# Patient Record
Sex: Female | Born: 1965
Health system: Southern US, Community
[De-identification: ages and names within clinical notes are randomized; demographics above are authoritative.]

## PROBLEM LIST (undated history)

## (undated) DIAGNOSIS — Z87442 Personal history of urinary calculi: Secondary | ICD-10-CM

## (undated) DIAGNOSIS — E785 Hyperlipidemia, unspecified: Secondary | ICD-10-CM

## (undated) DIAGNOSIS — R0602 Shortness of breath: Secondary | ICD-10-CM

## (undated) DIAGNOSIS — D649 Anemia, unspecified: Secondary | ICD-10-CM

## (undated) DIAGNOSIS — Z8719 Personal history of other diseases of the digestive system: Secondary | ICD-10-CM

## (undated) DIAGNOSIS — I447 Left bundle-branch block, unspecified: Secondary | ICD-10-CM

## (undated) DIAGNOSIS — E079 Disorder of thyroid, unspecified: Secondary | ICD-10-CM

## (undated) DIAGNOSIS — M1990 Unspecified osteoarthritis, unspecified site: Secondary | ICD-10-CM

## (undated) DIAGNOSIS — F419 Anxiety disorder, unspecified: Secondary | ICD-10-CM

## (undated) DIAGNOSIS — R7611 Nonspecific reaction to tuberculin skin test without active tuberculosis: Secondary | ICD-10-CM

## (undated) DIAGNOSIS — M545 Low back pain, unspecified: Secondary | ICD-10-CM

## (undated) DIAGNOSIS — E039 Hypothyroidism, unspecified: Secondary | ICD-10-CM

## (undated) DIAGNOSIS — G8929 Other chronic pain: Secondary | ICD-10-CM

## (undated) DIAGNOSIS — G43909 Migraine, unspecified, not intractable, without status migrainosus: Secondary | ICD-10-CM

## (undated) DIAGNOSIS — F329 Major depressive disorder, single episode, unspecified: Secondary | ICD-10-CM

## (undated) DIAGNOSIS — E119 Type 2 diabetes mellitus without complications: Secondary | ICD-10-CM

## (undated) DIAGNOSIS — F32A Depression, unspecified: Secondary | ICD-10-CM

## (undated) HISTORY — PX: TUBAL LIGATION: SHX77

## (undated) HISTORY — DX: Anxiety disorder, unspecified: F41.9

## (undated) HISTORY — DX: Hyperlipidemia, unspecified: E78.5

## (undated) HISTORY — DX: Disorder of thyroid, unspecified: E07.9

## (undated) HISTORY — PX: LAPAROSCOPY: SHX197

## (undated) HISTORY — PX: ENDOMETRIAL ABLATION: SHX621

---

## 1997-06-25 HISTORY — PX: DILATION AND CURETTAGE OF UTERUS: SHX78

## 1997-10-05 ENCOUNTER — Other Ambulatory Visit: Admission: RE | Admit: 1997-10-05 | Discharge: 1997-10-05 | Payer: Self-pay | Admitting: Obstetrics and Gynecology

## 1997-10-15 ENCOUNTER — Other Ambulatory Visit: Admission: RE | Admit: 1997-10-15 | Discharge: 1997-10-15 | Payer: Self-pay | Admitting: Obstetrics and Gynecology

## 1997-10-15 ENCOUNTER — Ambulatory Visit (HOSPITAL_COMMUNITY): Admission: AD | Admit: 1997-10-15 | Discharge: 1997-10-15 | Payer: Self-pay | Admitting: Obstetrics and Gynecology

## 1997-12-01 ENCOUNTER — Other Ambulatory Visit: Admission: RE | Admit: 1997-12-01 | Discharge: 1997-12-01 | Payer: Self-pay | Admitting: Obstetrics and Gynecology

## 1998-03-15 ENCOUNTER — Ambulatory Visit (HOSPITAL_COMMUNITY): Admission: RE | Admit: 1998-03-15 | Discharge: 1998-03-15 | Payer: Self-pay | Admitting: Obstetrics and Gynecology

## 1998-03-25 ENCOUNTER — Other Ambulatory Visit: Admission: RE | Admit: 1998-03-25 | Discharge: 1998-03-25 | Payer: Self-pay | Admitting: Obstetrics & Gynecology

## 1998-04-22 ENCOUNTER — Emergency Department (HOSPITAL_COMMUNITY): Admission: EM | Admit: 1998-04-22 | Discharge: 1998-04-22 | Payer: Self-pay

## 1998-09-22 ENCOUNTER — Inpatient Hospital Stay (HOSPITAL_COMMUNITY): Admission: AD | Admit: 1998-09-22 | Discharge: 1998-09-22 | Payer: Self-pay | Admitting: *Deleted

## 1998-10-30 ENCOUNTER — Inpatient Hospital Stay (HOSPITAL_COMMUNITY): Admission: AD | Admit: 1998-10-30 | Discharge: 1998-11-01 | Payer: Self-pay | Admitting: Obstetrics & Gynecology

## 1998-12-08 ENCOUNTER — Ambulatory Visit (HOSPITAL_COMMUNITY): Admission: RE | Admit: 1998-12-08 | Discharge: 1998-12-09 | Payer: Self-pay | Admitting: Obstetrics and Gynecology

## 1999-03-02 ENCOUNTER — Emergency Department (HOSPITAL_COMMUNITY): Admission: EM | Admit: 1999-03-02 | Discharge: 1999-03-02 | Payer: Self-pay | Admitting: Emergency Medicine

## 1999-07-20 ENCOUNTER — Emergency Department (HOSPITAL_COMMUNITY): Admission: EM | Admit: 1999-07-20 | Discharge: 1999-07-20 | Payer: Self-pay | Admitting: *Deleted

## 2000-03-30 ENCOUNTER — Emergency Department (HOSPITAL_COMMUNITY): Admission: EM | Admit: 2000-03-30 | Discharge: 2000-03-30 | Payer: Self-pay | Admitting: *Deleted

## 2000-10-08 ENCOUNTER — Encounter: Payer: Self-pay | Admitting: Obstetrics and Gynecology

## 2000-10-08 ENCOUNTER — Ambulatory Visit (HOSPITAL_COMMUNITY): Admission: RE | Admit: 2000-10-08 | Discharge: 2000-10-08 | Payer: Self-pay | Admitting: Obstetrics and Gynecology

## 2000-12-23 ENCOUNTER — Encounter: Payer: Self-pay | Admitting: Obstetrics and Gynecology

## 2000-12-23 ENCOUNTER — Ambulatory Visit (HOSPITAL_COMMUNITY): Admission: RE | Admit: 2000-12-23 | Discharge: 2000-12-23 | Payer: Self-pay | Admitting: Obstetrics and Gynecology

## 2001-01-31 ENCOUNTER — Ambulatory Visit (HOSPITAL_COMMUNITY): Admission: RE | Admit: 2001-01-31 | Discharge: 2001-01-31 | Payer: Self-pay | Admitting: Obstetrics and Gynecology

## 2001-01-31 ENCOUNTER — Encounter: Payer: Self-pay | Admitting: Obstetrics and Gynecology

## 2001-02-20 ENCOUNTER — Inpatient Hospital Stay (HOSPITAL_COMMUNITY): Admission: AD | Admit: 2001-02-20 | Discharge: 2001-02-20 | Payer: Self-pay | Admitting: Obstetrics and Gynecology

## 2001-02-23 ENCOUNTER — Inpatient Hospital Stay (HOSPITAL_COMMUNITY): Admission: AD | Admit: 2001-02-23 | Discharge: 2001-02-23 | Payer: Self-pay | Admitting: Obstetrics and Gynecology

## 2001-02-24 ENCOUNTER — Inpatient Hospital Stay (HOSPITAL_COMMUNITY): Admission: AD | Admit: 2001-02-24 | Discharge: 2001-02-27 | Payer: Self-pay | Admitting: Obstetrics and Gynecology

## 2001-04-07 ENCOUNTER — Ambulatory Visit (HOSPITAL_COMMUNITY): Admission: RE | Admit: 2001-04-07 | Discharge: 2001-04-07 | Payer: Self-pay | Admitting: Obstetrics and Gynecology

## 2002-03-06 ENCOUNTER — Emergency Department (HOSPITAL_COMMUNITY): Admission: EM | Admit: 2002-03-06 | Discharge: 2002-03-06 | Payer: Self-pay | Admitting: Emergency Medicine

## 2002-06-30 ENCOUNTER — Other Ambulatory Visit: Admission: RE | Admit: 2002-06-30 | Discharge: 2002-06-30 | Payer: Self-pay | Admitting: Obstetrics and Gynecology

## 2002-07-06 ENCOUNTER — Emergency Department (HOSPITAL_COMMUNITY): Admission: EM | Admit: 2002-07-06 | Discharge: 2002-07-06 | Payer: Self-pay | Admitting: Emergency Medicine

## 2003-06-27 ENCOUNTER — Emergency Department (HOSPITAL_COMMUNITY): Admission: EM | Admit: 2003-06-27 | Discharge: 2003-06-27 | Payer: Self-pay

## 2003-07-19 ENCOUNTER — Emergency Department (HOSPITAL_COMMUNITY): Admission: EM | Admit: 2003-07-19 | Discharge: 2003-07-19 | Payer: Self-pay | Admitting: Emergency Medicine

## 2003-09-22 ENCOUNTER — Emergency Department (HOSPITAL_COMMUNITY): Admission: EM | Admit: 2003-09-22 | Discharge: 2003-09-22 | Payer: Self-pay | Admitting: Emergency Medicine

## 2003-10-04 ENCOUNTER — Emergency Department (HOSPITAL_COMMUNITY): Admission: EM | Admit: 2003-10-04 | Discharge: 2003-10-04 | Payer: Self-pay | Admitting: Emergency Medicine

## 2004-04-27 ENCOUNTER — Emergency Department (HOSPITAL_COMMUNITY): Admission: EM | Admit: 2004-04-27 | Discharge: 2004-04-27 | Payer: Self-pay | Admitting: Emergency Medicine

## 2004-06-07 ENCOUNTER — Encounter: Admission: RE | Admit: 2004-06-07 | Discharge: 2004-06-07 | Payer: Self-pay | Admitting: Internal Medicine

## 2004-10-09 ENCOUNTER — Emergency Department (HOSPITAL_COMMUNITY): Admission: EM | Admit: 2004-10-09 | Discharge: 2004-10-09 | Payer: Self-pay | Admitting: Emergency Medicine

## 2004-10-17 ENCOUNTER — Emergency Department (HOSPITAL_COMMUNITY): Admission: EM | Admit: 2004-10-17 | Discharge: 2004-10-17 | Payer: Self-pay | Admitting: *Deleted

## 2004-10-17 ENCOUNTER — Other Ambulatory Visit: Admission: RE | Admit: 2004-10-17 | Discharge: 2004-10-17 | Payer: Self-pay | Admitting: Obstetrics and Gynecology

## 2004-10-26 ENCOUNTER — Ambulatory Visit (HOSPITAL_COMMUNITY): Admission: RE | Admit: 2004-10-26 | Discharge: 2004-10-26 | Payer: Self-pay | Admitting: Obstetrics and Gynecology

## 2004-11-22 ENCOUNTER — Ambulatory Visit (HOSPITAL_COMMUNITY): Admission: RE | Admit: 2004-11-22 | Discharge: 2004-11-22 | Payer: Self-pay | Admitting: Obstetrics and Gynecology

## 2004-11-22 ENCOUNTER — Encounter (INDEPENDENT_AMBULATORY_CARE_PROVIDER_SITE_OTHER): Payer: Self-pay | Admitting: Specialist

## 2005-05-30 ENCOUNTER — Emergency Department (HOSPITAL_COMMUNITY): Admission: EM | Admit: 2005-05-30 | Discharge: 2005-05-31 | Payer: Self-pay | Admitting: Emergency Medicine

## 2005-06-06 ENCOUNTER — Emergency Department (HOSPITAL_COMMUNITY): Admission: EM | Admit: 2005-06-06 | Discharge: 2005-06-06 | Payer: Self-pay | Admitting: Emergency Medicine

## 2005-08-20 ENCOUNTER — Emergency Department (HOSPITAL_COMMUNITY): Admission: EM | Admit: 2005-08-20 | Discharge: 2005-08-20 | Payer: Self-pay | Admitting: Emergency Medicine

## 2006-05-02 ENCOUNTER — Emergency Department (HOSPITAL_COMMUNITY): Admission: EM | Admit: 2006-05-02 | Discharge: 2006-05-03 | Payer: Self-pay | Admitting: Emergency Medicine

## 2006-06-24 ENCOUNTER — Emergency Department (HOSPITAL_COMMUNITY): Admission: EM | Admit: 2006-06-24 | Discharge: 2006-06-24 | Payer: Self-pay | Admitting: Emergency Medicine

## 2006-06-25 DIAGNOSIS — R7611 Nonspecific reaction to tuberculin skin test without active tuberculosis: Secondary | ICD-10-CM

## 2006-06-25 HISTORY — DX: Nonspecific reaction to tuberculin skin test without active tuberculosis: R76.11

## 2006-07-24 ENCOUNTER — Emergency Department (HOSPITAL_COMMUNITY): Admission: EM | Admit: 2006-07-24 | Discharge: 2006-07-24 | Payer: Self-pay | Admitting: Emergency Medicine

## 2006-09-12 ENCOUNTER — Emergency Department (HOSPITAL_COMMUNITY): Admission: EM | Admit: 2006-09-12 | Discharge: 2006-09-13 | Payer: Self-pay | Admitting: Emergency Medicine

## 2006-12-30 ENCOUNTER — Emergency Department (HOSPITAL_COMMUNITY): Admission: EM | Admit: 2006-12-30 | Discharge: 2006-12-30 | Payer: Self-pay | Admitting: Emergency Medicine

## 2007-04-05 ENCOUNTER — Emergency Department (HOSPITAL_COMMUNITY): Admission: EM | Admit: 2007-04-05 | Discharge: 2007-04-05 | Payer: Self-pay | Admitting: Emergency Medicine

## 2007-05-07 ENCOUNTER — Emergency Department (HOSPITAL_COMMUNITY): Admission: EM | Admit: 2007-05-07 | Discharge: 2007-05-07 | Payer: Self-pay | Admitting: Emergency Medicine

## 2007-12-01 ENCOUNTER — Emergency Department (HOSPITAL_COMMUNITY): Admission: EM | Admit: 2007-12-01 | Discharge: 2007-12-01 | Payer: Self-pay | Admitting: Family Medicine

## 2007-12-14 ENCOUNTER — Emergency Department (HOSPITAL_COMMUNITY): Admission: EM | Admit: 2007-12-14 | Discharge: 2007-12-14 | Payer: Self-pay | Admitting: Emergency Medicine

## 2008-03-03 ENCOUNTER — Emergency Department (HOSPITAL_COMMUNITY): Admission: EM | Admit: 2008-03-03 | Discharge: 2008-03-03 | Payer: Self-pay | Admitting: Emergency Medicine

## 2008-07-14 ENCOUNTER — Ambulatory Visit (HOSPITAL_COMMUNITY): Admission: RE | Admit: 2008-07-14 | Discharge: 2008-07-14 | Payer: Self-pay | Admitting: Internal Medicine

## 2008-07-16 ENCOUNTER — Encounter: Admission: RE | Admit: 2008-07-16 | Discharge: 2008-07-16 | Payer: Self-pay | Admitting: Internal Medicine

## 2008-07-27 ENCOUNTER — Encounter (INDEPENDENT_AMBULATORY_CARE_PROVIDER_SITE_OTHER): Payer: Self-pay | Admitting: Interventional Radiology

## 2008-07-27 ENCOUNTER — Encounter: Admission: RE | Admit: 2008-07-27 | Discharge: 2008-07-27 | Payer: Self-pay | Admitting: Internal Medicine

## 2008-07-27 ENCOUNTER — Other Ambulatory Visit: Admission: RE | Admit: 2008-07-27 | Discharge: 2008-07-27 | Payer: Self-pay | Admitting: Interventional Radiology

## 2009-04-14 ENCOUNTER — Emergency Department (HOSPITAL_COMMUNITY): Admission: EM | Admit: 2009-04-14 | Discharge: 2009-04-14 | Payer: Self-pay | Admitting: Emergency Medicine

## 2009-08-02 ENCOUNTER — Emergency Department (HOSPITAL_COMMUNITY): Admission: EM | Admit: 2009-08-02 | Discharge: 2009-08-02 | Payer: Self-pay | Admitting: Emergency Medicine

## 2009-09-18 ENCOUNTER — Emergency Department (HOSPITAL_COMMUNITY): Admission: EM | Admit: 2009-09-18 | Discharge: 2009-09-18 | Payer: Self-pay | Admitting: Emergency Medicine

## 2010-07-16 ENCOUNTER — Encounter: Payer: Self-pay | Admitting: Internal Medicine

## 2010-11-10 NOTE — Op Note (Signed)
NAMECYNDA, Felicia Solis                 ACCOUNT NO.:  [id number]   MEDICAL RECORD NO.:  [id number]          PATIENT TYPE:  AMB   LOCATION:  SDC                           FACILITY:  [facility]   PHYSICIAN:  Naima A. Dillard, M.D. DATE OF BIRTH:  [birth year]   DATE OF PROCEDURE:  11/22/2004  DATE OF DISCHARGE:                                 OPERATIVE REPORT   PREOPERATIVE DIAGNOSES:  1.  Dysmenorrhea.  2.  Pelvic pain.  3.  Menorrhagia.   POSTOPERATIVE DIAGNOSIS:  1.  Dysmenorrhea.  2.  Pelvic pain.  3.  Menorrhagia.   PROCEDURE:  D&C hysteroscopy, NovaSure ablation.   SURGEON:  Naima A. Dillard, M.D.   ASSISTANT:  None.   ANESTHESIA:  General laryngeal mask airway.   SPECIMENS:  Endometrial curettings.   ESTIMATED BLOOD LOSS:  Minimal.   COMPLICATIONS:  None.   DESCRIPTION OF PROCEDURE:  Patient is taken to the operating room where she  was given general anesthesia, placed in the dorsal lithotomy position and  prepped and draped in the normal sterile fashion.  Bladder was drained of  about 20 mL of urine.  Bivalve speculum was placed into the vagina.  The  anterior lip of the cervix was grasped with single-tooth tenaculum.  The  uterus did sound to 10 cm.  The cervical length was noted to be 4 cm which  gave a cavity length of 6 cm.  The cervix was further dilated with Shawnie Pons  dilators and the hysteroscope was placed into the uterine cavity, both ostia  were seen, no polyps or submucosal fibroids were noted, just abundant  endometrium.  The instrument was then removed.  The sharp curettage was  placed into the uterine cavity and moderate amount of endometrial curettings  were obtained.  All instruments were then removed from the uterus.  The  NovaSure was then placed and seated.  The cavity was noted to be 3.8, power  was 125 and ablation was done for one minute.  After the  safety test was passed, all instruments were removed from the vagina and the  tenaculum was removed from the  cervix with hemostasis noted.  Sponge, lap  and needle counts correct x2.  Patient went to the recovery room in stable  condition.       NAD/MEDQ  D:  11/22/2004  T:  11/22/2004  Job:  [id number]

## 2010-11-10 NOTE — Op Note (Signed)
[facility]  Patient:    Felicia Solis, Felicia Solis                          MRN: [id number] Proc. Date: 10/08/00 Attending:  Erie Noe P. Pennie Rushing, M.D.                           Operative Report  PREOPERATIVE DIAGNOSES:       1. Maternal age of 41.                               2. Pregnancy at 18 weeks.  POSTOPERATIVE DIAGNOSES:      1. Maternal age of 17.                               2. Pregnancy at 18 weeks.  OPERATION:                    Genetic amniocentesis.  SURGEON:                      Vanessa P. Haygood, M.D.  ASSISTANT:  ANESTHESIA:                   Local anesthesia.  ESTIMATED BLOOD LOSS:         Less than 10 cc.  COMPLICATIONS:                None.  FINDINGS:                     The pregnancy contained an anterior placenta and                               adequate fluid.  DESCRIPTION OF PROCEDURE:     The patient was taken to the ultrasound suite and after undergoing a detailed ultrasound, a pocket of fluid was noted which was free of the insertion site and free of fetal parts. There was also no cord noted on ultrasound.  Under ultrasound guidance and a single pass of a 20-gauge spinal needle, the amniotic fluid pocket was accessed.  This was performed after cleansing and local anesthetic infiltration of that area. There were 5 cc of clear fluid drawn into the first syringe and then 10 cc into the second syringe. The amniocentesis site was documented on ultrasound and post amniocentesis heart rate was 150 beats per minute.  The needle was removed and the bandage placed over the amnio site.  The patient tolerated the procedure well and the amniotic fluid was sent to [facility] for genetic analysis. DD:  10/08/00 TD:  10/09/00 Job: [id number]

## 2010-11-10 NOTE — H&P (Signed)
Felicia Solis, Felicia Solis                 ACCOUNT NO.:  [id number]   MEDICAL RECORD NO.:  [id number]          PATIENT TYPE:  AMB   LOCATION:  SDC                           FACILITY:  [facility]   PHYSICIAN:  Naima A. Dillard, M.D. DATE OF BIRTH:  [birth year]   DATE OF ADMISSION:  DATE OF DISCHARGE:                                HISTORY & PHYSICAL   CHIEF COMPLAINT:  Dysmenorrhea, pelvic pain, and menorrhagia.   HISTORY OF PRESENT ILLNESS:  The patient is a 45 year old African-American  female, gravida 5, para 3, who presented with complaint of heavy menses, and  painful menses for over 6 months.  The patient says her menses are every  month lasting for 8 days.  She changes her pad about every 1 to 2 hours.  The patient denied having any shortness of breath or chest pain.  She does  have a history of fibroids seen on ultrasound.  She is not on contraception.  Denies having any bleeding disorder.  She does have dysmenorrhea with  menses.  She denies trying any new medications.  On her ultrasound which was  done on Oct 26, 2004, both ovaries were normal.  The uterus was 10 x 6 x 7 cm  and there were two fibroids seen, the largest being 1.3 cm.  Endometrial  stripe was 1.4 cm.  The patient had endometrial biopsy done Oct 26, 2004,  which was significant for benign endocervical polyp and benign proliferative  endometrium.   PAST MEDICAL HISTORY:  Significant for asthma.   PAST SURGICAL HISTORY:  Significant for bilateral tubal ligation and  laparoscopy x2 for pelvic pain.   PAST OB HISTORY:  Significant for vaginal delivery x3 without any  complications.  She had two spontaneous abortions in the second trimester  which required D&C.   SOCIAL HISTORY:  Negative for alcohol, drug, and tobacco use or illicit drug  use.   MEDICATIONS:  Albuterol p.r.n.   FAMILY HISTORY:  Significant for a mother with hypertension and a sister  with diabetes.   ALLERGIES:  No known drug allergies.   REVIEW OF  SYSTEMS:  CARDIOVASCULAR:  Unremarkable.  RESPIRATORY:  Significant for asthma.  ENDOCRINE:  Unremarkable.  PSYCHIATRIC:  MUSCULOSKELETAL:  Unremarkable.  GENITOURINARY:  As above.   PHYSICAL EXAMINATION:  VITAL SIGNS:  The patient's blood pressure is 102/60,  weight 243 pounds.  HEENT:  Pupils are equal, hearing is normal, throat is clear.  Thyroid is  not enlarged.  HEART:  Regular rate and rhythm.  LUNGS:  Clear to auscultation bilaterally.  BREASTS:  No masses, discharge, skin changes, or nipple retraction  bilaterally.  BACK:  No CVA tenderness bilaterally.  ABDOMEN:  Nontender without any masses or organomegaly.  EXTREMITIES:  No cyanosis, clubbing, or edema.  NEUROLOGY:  Within normal limits.  PELVIC:  Full vaginal examination is within normal limits.  Cervix is  nontender without lesions.  Uterine size is difficult to tell secondary to  body habitus.  Adnexa had no masses and nontender.  RECTOVAGINAL:  Negative.   The patient's UA was negative.  Wet prep  was negative.   ASSESSMENT:  Pelvic pain and menorrhagia, uterine fibroids with benign  endometrial biopsy.   All treatments were reviewed for menorrhagia, pelvic pain, and dysmenorrhea.  The patient desires to go with hysteroscopy, D&C, and NovaSure ablation.  The risks are, but not limited to, bleeding, infection, damage to internal  organs such as bowel, bladder, and major blood vessels.  This was explained  to the patient in detail.      NAD/MEDQ  D:  11/21/2004  T:  11/21/2004  Job:  [id number]
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
## 2010-11-10 NOTE — H&P (Signed)
[facility]  Patient:    Felicia Solis, Felicia Solis Visit Number: [id number] MRN: [id number]          Service Type: Attending:  Maris Berger. Pennie Rushing, M.D. Dictated by:   Henreitta Leber, P.A. Adm. Date:  04/07/01                           History and Physical  DATE OF BIRTH:                [birth year]  HISTORY OF PRESENT ILLNESS:   Mrs. Delisa is a 45 year old, married African-American female, gravida 5, para 3-0-2-3 presenting for sterilization through laparoscopic tubal cautery.  PAST OBSTETRIC HISTORY:       Gravida 5, para 3-0-2-3.  GYNECOLOGICAL HISTORY:        Menarche 45 years of age. Her periods are regular. However, she has a remote history of oligomenorrhea, Chlamydia, and gonorrhea. She currently is not using any method of contraception. Has no history of having abnormal Pap smear. Her last normal Pap smear was February 2002.  PAST MEDICAL HISTORY:         Positive for migraines, hiatal hernia, and anemia.  PAST SURGICAL HISTORY:        The patient is status post D&E secondary to SAB, laparoscopy in the 1970s. She denies any history of blood transfusions or problems with anesthesia.  SOCIAL HISTORY/HABITS:        The patient is married. She works as a Lawyer. The patient does not use tobacco or alcohol.  FAMILY HISTORY:               Positive for hypertension, diabetes mellitus, cardiovascular disease, migraines, breast cancer (maternal aunt), and cervical cancer (maternal grandmother).  REVIEW OF SYSTEMS:            Negative, except as mentioned in previous history.  PHYSICAL EXAMINATION:  VITAL SIGNS:                  Blood pressure 110/80, weight is 217 pounds, height is 5 feet 7 1/2 inches tall.  HEENT:                        Pupils are equal. Hearing is normal. Throat is clear. The patient is hirsute.  NECK:                         Thyroid is not enlarged.  HEART:                        Regular rate and rhythm. There is no  murmur.  LUNGS:                        Clear without wheezes, rales, or rhonchi.  BACK:                         Without CVA tenderness.  ABDOMEN:                      Bowel sounds are present. It is soft, nontender. There is no organomegaly.  EXTREMITIES:                  Without clubbing, cyanosis, or edema.  PELVIC:  EGBUS is within normal limits. Vagina is rugous. Do note that patient has menstrual blood in the vaginal vault. Cervix is nontender without lesions. Uterus is normal size, shape, and consistency without tenderness. Adnexa without tenderness or masses. Rectovaginal without tenderness or masses.  IMPRESSION:                   Desire for permanent sterilization.  DISPOSITION:                  A discussion was held with the patient regarding methods of contraception, effectiveness, mechanism of action, risks and benefits. The patient has consented to undergo laparoscopic tubal cautery. She understands the risks involved to include, but are not limited to, reaction to anesthesia, infection, bleeding, and damage to adjacent organs. The patients surgery is scheduled for April 07, 2001, at [facility], 1:45 p.m. Dictated by:   Henreitta Leber, P.A. Attending:  Maris Berger. Pennie Rushing, M.D. DD:  04/03/01 TD:  04/03/01 Job: [id number]

## 2010-11-10 NOTE — Op Note (Signed)
[facility]  Patient:    Felicia Solis, Felicia Solis Visit Number: [id number] MRN: [id number]          Service Type: DSU Location: [facility] Attending Physician:  Shaune Spittle Proc. Date: 04/07/01 Admit Date:  04/07/2001                             Operative Report  PREOPERATIVE DIAGNOSIS:       Desire for surgical sterilization.  POSTOPERATIVE DIAGNOSES:      1. Desire for surgical sterilization.                               2. Morphologic appearance of ovaries consistent                                  with polycystic ovarian syndrome.  OPERATION:                    Laparoscopic tubal cautery.  SURGEON:                      Vanessa P. Pennie Rushing, M.D.  ANESTHESIA:                   General orotracheal.  ESTIMATED BLOOD LOSS:         Less than 10 cc.  COMPLICATIONS:                None.  FINDINGS:                     The uterus and tubes were within normal limits. The ovaries bilaterally were upper limits of normal size with a glistening capsule covering several small cystic areas, all consistent with polycystic ovarian syndrome.  DESCRIPTION OF PROCEDURE:     The patient was brought to the operating room after appropriate identification and placed on the operating table.  After the attainment of adequate general anesthesia she was placed in the modified lithotomy position.  The abdomen, perineum and vagina were prepped with multiple layers of Betadine.  A red Robinson catheter was used to empty the bladder.  A Hulka tenaculum was placed on the anterior cervix.  The abdomen was draped as a sterile field.  Subumbilical injection of a total of 9 cc of quarter percent Marcaine was undertaken.  A subumbilical incision was made.  A Veress cannula was placed through that incision into the peritoneal cavity.  A pneumoperitoneum was created with 3.5 liters of CO2.  The Veress cannula was removed and the laparoscopic trocar placed through that incision  into the peritoneal cavity.  The laparoscope was placed through the trocar sleeve.  The cautery mechanism was placed through the operating channel of the laparoscope and the right fallopian tube identified, followed to its fimbriated end, then grasped at the isthmic portion and cauterized in two adjacent areas.  A similar procedure was carried out on the opposite side.  Hemostasis was noted to be adequate.  All instruments were then removed from the peritoneal cavity under direct visualization as the CO2 was allowed to escape.  The subumbilical incision was closed with a subcuticular suture of 3-0 Vicryl.  The Hulka tenaculum was removed.  The patient was awakened from general anesthesia and taken to the recovery room in satisfactory condition  having tolerated the procedure well with sponge and instrument counts correct. Attending Physician:  Shaune Spittle DD:  04/07/01 TD:  04/07/01 Job: [id number]

## 2011-04-05 LAB — POCT CARDIAC MARKERS
CKMB, poc: <1 — ABNORMAL LOW
Myoglobin, poc: 53
Operator id: 4074

## 2011-04-05 LAB — BASIC METABOLIC PANEL
Chloride: 106
GFR calc Af Amer: >60
Glucose, Bld: 99
Potassium: 3.2 — ABNORMAL LOW
Sodium: 140

## 2011-08-09 ENCOUNTER — Other Ambulatory Visit: Payer: Self-pay | Admitting: Family Medicine

## 2011-08-09 DIAGNOSIS — Z1231 Encounter for screening mammogram for malignant neoplasm of breast: Secondary | ICD-10-CM

## 2011-08-24 ENCOUNTER — Ambulatory Visit
Admission: RE | Admit: 2011-08-24 | Discharge: 2011-08-24 | Disposition: A | Discharge status: Home / Self Care | Payer: Self-pay | Source: Ambulatory Visit | Attending: Family Medicine | Admitting: Family Medicine

## 2011-08-24 DIAGNOSIS — Z1231 Encounter for screening mammogram for malignant neoplasm of breast: Secondary | ICD-10-CM

## 2011-08-27 ENCOUNTER — Encounter: Payer: Self-pay | Admitting: Family Medicine

## 2011-08-27 ENCOUNTER — Ambulatory Visit (INDEPENDENT_AMBULATORY_CARE_PROVIDER_SITE_OTHER): Payer: Self-pay | Admitting: Family Medicine

## 2011-08-27 ENCOUNTER — Other Ambulatory Visit (HOSPITAL_COMMUNITY)
Admission: RE | Admit: 2011-08-27 | Discharge: 2011-08-27 | Disposition: A | Discharge status: Home / Self Care | Payer: Self-pay | Source: Ambulatory Visit | Attending: Family Medicine | Admitting: Family Medicine

## 2011-08-27 VITALS — BP 122/82 | HR 78 | Temp 98.4°F | Ht 66.5 in | Wt 241.2 lb

## 2011-08-27 DIAGNOSIS — F418 Other specified anxiety disorders: Secondary | ICD-10-CM | POA: Insufficient documentation

## 2011-08-27 DIAGNOSIS — Z124 Encounter for screening for malignant neoplasm of cervix: Secondary | ICD-10-CM

## 2011-08-27 DIAGNOSIS — F32A Depression, unspecified: Secondary | ICD-10-CM

## 2011-08-27 DIAGNOSIS — Z0000 Encounter for general adult medical examination without abnormal findings: Secondary | ICD-10-CM

## 2011-08-27 DIAGNOSIS — F329 Major depressive disorder, single episode, unspecified: Secondary | ICD-10-CM

## 2011-08-27 DIAGNOSIS — N920 Excessive and frequent menstruation with regular cycle: Secondary | ICD-10-CM

## 2011-08-27 DIAGNOSIS — Z01419 Encounter for gynecological examination (general) (routine) without abnormal findings: Secondary | ICD-10-CM | POA: Insufficient documentation

## 2011-08-27 DIAGNOSIS — Z1159 Encounter for screening for other viral diseases: Secondary | ICD-10-CM | POA: Insufficient documentation

## 2011-08-27 DIAGNOSIS — R319 Hematuria, unspecified: Secondary | ICD-10-CM

## 2011-08-27 DIAGNOSIS — Z862 Personal history of diseases of the blood and blood-forming organs and certain disorders involving the immune mechanism: Secondary | ICD-10-CM

## 2011-08-27 DIAGNOSIS — Z8639 Personal history of other endocrine, nutritional and metabolic disease: Secondary | ICD-10-CM

## 2011-08-27 LAB — POCT URINALYSIS DIPSTICK
Glucose, UA: NEGATIVE
Ketones, UA: NEGATIVE

## 2011-08-27 LAB — LIPID PANEL
LDL Cholesterol: 119 mg/dL — ABNORMAL HIGH (ref 0–99)
Triglycerides: 81 mg/dL (ref 0.0–149.0)
VLDL: 16.2 mg/dL (ref 0.0–40.0)

## 2011-08-27 LAB — HEPATIC FUNCTION PANEL
ALT: 23 U/L (ref 0–35)
AST: 18 U/L (ref 0–37)
Alkaline Phosphatase: 55 U/L (ref 39–117)

## 2011-08-27 LAB — BASIC METABOLIC PANEL
BUN: 10 mg/dL (ref 6–23)
Chloride: 108 mEq/L (ref 96–112)
Potassium: 3.7 mEq/L (ref 3.5–5.1)
Sodium: 139 mEq/L (ref 135–145)

## 2011-08-27 LAB — CBC WITH DIFFERENTIAL/PLATELET
Basophils Absolute: 0 10*3/uL (ref 0.0–0.1)
Basophils Relative: 0.7 % (ref 0.0–3.0)
HCT: 39.2 % (ref 36.0–46.0)
Hemoglobin: 12.9 g/dL (ref 12.0–15.0)
Lymphs Abs: 3 10*3/uL (ref 0.7–4.0)
Monocytes Absolute: 0.5 10*3/uL (ref 0.1–1.0)
Neutrophils Relative %: 47.5 % (ref 43.0–77.0)
Platelets: 289 10*3/uL (ref 150.0–400.0)

## 2011-08-27 MED ORDER — CITALOPRAM HYDROBROMIDE 10 MG PO TABS: 10.0000 mg | ORAL_TABLET | Freq: Every day | ORAL | Status: DC

## 2011-08-27 NOTE — Progress Notes (Signed)
Subjective:     Felicia Solis is a 46 y.o. female and is here for a comprehensive physical exam. The patient reports problem with very heavy period last week for one day with cramping.  She has had only spotting since ablation.  She also c/o depression/ anxiety for the last several months.   She cries for no reason --she is not suicidal.   Pt is also c/o bad breath that seems to never get better --no matter what she does.   History   Social History  . Marital Status: Married    Spouse Name: N/A    Number of Children: N/A  . Years of Education: N/A   Occupational History  . [employer]    Social History Main Topics  . Smoking status: Never Smoker   . Smokeless tobacco: Never Used  . Alcohol Use: No  . Drug Use: No  . Sexually Active: Yes -- Female partner(s)   Other Topics Concern  . Not on file   Social History Narrative  . No narrative on file   Health Maintenance  Topic Date Due  . Mammogram  07/16/2009  . Influenza Vaccine  03/28/2012  . Pap Smear  08/27/2014  . Tetanus/tdap  08/27/2015    The following portions of the patient's history were reviewed and updated as appropriate: allergies, current medications, past family history, past medical history, past social history, past surgical history and problem list.  Review of Systems Review of Systems  Constitutional: Negative for activity change, appetite change and fatigue.  HENT: Negative for hearing loss, congestion, tinnitus and ear discharge.  dentist- due Eyes: Negative for visual disturbance (see optho appointment tomorrow) Respiratory: Negative for cough, chest tightness and shortness of breath.   Cardiovascular: Negative for chest pain, palpitations and leg swelling.  Gastrointestinal: Negative for abdominal pain, diarrhea, constipation and abdominal distention.  Genitourinary: Negative for urgency, frequency, decreased urine volume and difficulty urinating.  Musculoskeletal: Negative for back pain, arthralgias  and gait problem.  Skin: Negative for color change, pallor and rash.  Neurological: Negative for dizziness, light-headedness, numbness and headaches.  Hematological: Negative for adenopathy. Does not bruise/bleed easily.  Psychiatric/Behavioral: Negative for suicidal ideas, confusion, sleep disturbance, self-injury, dysphoric mood, decreased concentration and agitation.       Objective:    BP 122/82  Pulse 78  Temp(Src) 98.4 F (36.9 C) (Oral)  Ht 5' 6.5" (1.689 m)  Wt 241 lb 3.2 oz (109.408 kg)  BMI 38.35 kg/m2  SpO2 99% General appearance: alert, cooperative, appears stated age and no distress Head: Normocephalic, without obvious abnormality, atraumatic Eyes: conjunctivae/corneas clear. PERRL, EOM's intact. Fundi benign. Ears: normal TM's and external ear canals both ears Nose: Nares normal. Septum midline. Mucosa normal. No drainage or sinus tenderness. Throat: lips, mucosa, and tongue normal; teeth and gums normal Neck: + goiter Back: symmetric, no curvature. ROM normal. No CVA tenderness. Lungs: clear to auscultation bilaterally Breasts: normal appearance, no masses or tenderness, No nipple retraction or dimpling, No nipple discharge or bleeding, No axillary or supraclavicular adenopathy Heart: regular rate and rhythm, S1, S2 normal, no murmur, click, rub or gallop Abdomen: soft, non-tender; bowel sounds normal; no masses,  no organomegaly Pelvic: cervix normal in appearance, external genitalia normal, no adnexal masses or tenderness, no cervical motion tenderness, rectovaginal septum normal, uterus normal size, shape, and consistency and vagina normal without discharge Extremities: extremities normal, atraumatic, no cyanosis or edema Pulses: 2+ and symmetric Skin: Skin color, texture, turgor normal. No rashes or lesions---+  lg skin tag L side at waist line Lymph nodes: Cervical, supraclavicular, and axillary nodes normal. Neurologic: Alert and oriented X 3, normal strength  and tone. Normal symmetric reflexes. Normal coordination and gait psych-- pt crying and c/o depression and anger problems,  no suicidal ideation    Assessment:    Healthy female exam.      Plan:    ghm utd Check fasting labs See After Visit Summary for Counseling Recommendations

## 2011-08-27 NOTE — Assessment & Plan Note (Signed)
Pt has appointment with gyn Check labs

## 2011-08-27 NOTE — Assessment & Plan Note (Signed)
celexa 10 mg qd  

## 2011-08-27 NOTE — Patient Instructions (Signed)
Preventive Care for Adults, Female A healthy lifestyle and preventive care can promote health and wellness. Preventive health guidelines for women include the following key practices.  A routine yearly physical is a good way to check with your caregiver about your health and preventive screening. It is a chance to share any concerns and updates on your health, and to receive a thorough exam.   Visit your dentist for a routine exam and preventive care every 6 months. Brush your teeth twice a day and floss once a day. Good oral hygiene prevents tooth decay and gum disease.   The frequency of eye exams is based on your age, health, family medical history, use of contact lenses, and other factors. Follow your caregiver's recommendations for frequency of eye exams.   Eat a healthy diet. Foods like vegetables, fruits, whole grains, low-fat dairy products, and lean protein foods contain the nutrients you need without too many calories. Decrease your intake of foods high in solid fats, added sugars, and salt. Eat the right amount of calories for you.Get information about a proper diet from your caregiver, if necessary.   Regular physical exercise is one of the most important things you can do for your health. Most adults should get at least 150 minutes of moderate-intensity exercise (any activity that increases your heart rate and causes you to sweat) each week. In addition, most adults need muscle-strengthening exercises on 2 or more days a week.   Maintain a healthy weight. The body mass index (BMI) is a screening tool to identify possible weight problems. It provides an estimate of body fat based on height and weight. Your caregiver can help determine your BMI, and can help you achieve or maintain a healthy weight.For adults 20 years and older:   A BMI below 18.5 is considered underweight.   A BMI of 18.5 to 24.9 is normal.   A BMI of 25 to 29.9 is considered overweight.   A BMI of 30 and above is  considered obese.   Maintain normal blood lipids and cholesterol levels by exercising and minimizing your intake of saturated fat. Eat a balanced diet with plenty of fruit and vegetables. Blood tests for lipids and cholesterol should begin at age 20 and be repeated every 5 years. If your lipid or cholesterol levels are high, you are over 50, or you are at high risk for heart disease, you may need your cholesterol levels checked more frequently.Ongoing high lipid and cholesterol levels should be treated with medicines if diet and exercise are not effective.   If you smoke, find out from your caregiver how to quit. If you do not use tobacco, do not start.   If you are pregnant, do not drink alcohol. If you are breastfeeding, be very cautious about drinking alcohol. If you are not pregnant and choose to drink alcohol, do not exceed 1 drink per day. One drink is considered to be 12 ounces (355 mL) of beer, 5 ounces (148 mL) of wine, or 1.5 ounces (44 mL) of liquor.   Avoid use of street drugs. Do not share needles with anyone. Ask for help if you need support or instructions about stopping the use of drugs.   High blood pressure causes heart disease and increases the risk of stroke. Your blood pressure should be checked at least every 1 to 2 years. Ongoing high blood pressure should be treated with medicines if weight loss and exercise are not effective.   If you are 55 to 47   years old, ask your caregiver if you should take aspirin to prevent strokes.   Diabetes screening involves taking a blood sample to check your fasting blood sugar level. This should be done once every 3 years, after age 45, if you are within normal weight and without risk factors for diabetes. Testing should be considered at a younger age or be carried out more frequently if you are overweight and have at least 1 risk factor for diabetes.   Breast cancer screening is essential preventive care for women. You should practice "breast  self-awareness." This means understanding the normal appearance and feel of your breasts and may include breast self-examination. Any changes detected, no matter how small, should be reported to a caregiver. Women in their 20s and 30s should have a clinical breast exam (CBE) by a caregiver as part of a regular health exam every 1 to 3 years. After age 40, women should have a CBE every year. Starting at age 40, women should consider having a mammography (breast X-ray test) every year. Women who have a family history of breast cancer should talk to their caregiver about genetic screening. Women at a high risk of breast cancer should talk to their caregivers about having magnetic resonance imaging (MRI) and a mammography every year.   The Pap test is a screening test for cervical cancer. A Pap test can show cell changes on the cervix that might become cervical cancer if left untreated. A Pap test is a procedure in which cells are obtained and examined from the lower end of the uterus (cervix).   Women should have a Pap test starting at age 21.   Between ages 21 and 29, Pap tests should be repeated every 2 years.   Beginning at age 30, you should have a Pap test every 3 years as long as the past 3 Pap tests have been normal.   Some women have medical problems that increase the chance of getting cervical cancer. Talk to your caregiver about these problems. It is especially important to talk to your caregiver if a new problem develops soon after your last Pap test. In these cases, your caregiver may recommend more frequent screening and Pap tests.   The above recommendations are the same for women who have or have not gotten the vaccine for human papillomavirus (HPV).   If you had a hysterectomy for a problem that was not cancer or a condition that could lead to cancer, then you no longer need Pap tests. Even if you no longer need a Pap test, a regular exam is a good idea to make sure no other problems are  starting.   If you are between ages 65 and 70, and you have had normal Pap tests going back 10 years, you no longer need Pap tests. Even if you no longer need a Pap test, a regular exam is a good idea to make sure no other problems are starting.   If you have had past treatment for cervical cancer or a condition that could lead to cancer, you need Pap tests and screening for cancer for at least 20 years after your treatment.   If Pap tests have been discontinued, risk factors (such as a new sexual partner) need to be reassessed to determine if screening should be resumed.   The HPV test is an additional test that may be used for cervical cancer screening. The HPV test looks for the virus that can cause the cell changes on the cervix.   The cells collected during the Pap test can be tested for HPV. The HPV test could be used to screen women aged 30 years and older, and should be used in women of any age who have unclear Pap test results. After the age of 30, women should have HPV testing at the same frequency as a Pap test.   Colorectal cancer can be detected and often prevented. Most routine colorectal cancer screening begins at the age of 50 and continues through age 75. However, your caregiver may recommend screening at an earlier age if you have risk factors for colon cancer. On a yearly basis, your caregiver may provide home test kits to check for hidden blood in the stool. Use of a small camera at the end of a tube, to directly examine the colon (sigmoidoscopy or colonoscopy), can detect the earliest forms of colorectal cancer. Talk to your caregiver about this at age 50, when routine screening begins. Direct examination of the colon should be repeated every 5 to 10 years through age 75, unless early forms of pre-cancerous polyps or small growths are found.   Hepatitis C blood testing is recommended for all people born from 1945 through 1965 and any individual with known risks for hepatitis C.    Practice safe sex. Use condoms and avoid high-risk sexual practices to reduce the spread of sexually transmitted infections (STIs). STIs include gonorrhea, chlamydia, syphilis, trichomonas, herpes, HPV, and human immunodeficiency virus (HIV). Herpes, HIV, and HPV are viral illnesses that have no cure. They can result in disability, cancer, and death. Sexually active women aged 25 and younger should be checked for chlamydia. Older women with new or multiple partners should also be tested for chlamydia. Testing for other STIs is recommended if you are sexually active and at increased risk.   Osteoporosis is a disease in which the bones lose minerals and strength with aging. This can result in serious bone fractures. The risk of osteoporosis can be identified using a bone density scan. Women ages 65 and over and women at risk for fractures or osteoporosis should discuss screening with their caregivers. Ask your caregiver whether you should take a calcium supplement or vitamin D to reduce the rate of osteoporosis.   Menopause can be associated with physical symptoms and risks. Hormone replacement therapy is available to decrease symptoms and risks. You should talk to your caregiver about whether hormone replacement therapy is right for you.   Use sunscreen with sun protection factor (SPF) of 30 or more. Apply sunscreen liberally and repeatedly throughout the day. You should seek shade when your shadow is shorter than you. Protect yourself by wearing long sleeves, pants, a wide-brimmed hat, and sunglasses year round, whenever you are outdoors.   Once a month, do a whole body skin exam, using a mirror to look at the skin on your back. Notify your caregiver of new moles, moles that have irregular borders, moles that are larger than a pencil eraser, or moles that have changed in shape or color.   Stay current with required immunizations.   Influenza. You need a dose every fall (or winter). The composition of  the flu vaccine changes each year, so being vaccinated once is not enough.   Pneumococcal polysaccharide. You need 1 to 2 doses if you smoke cigarettes or if you have certain chronic medical conditions. You need 1 dose at age 65 (or older) if you have never been vaccinated.   Tetanus, diphtheria, pertussis (Tdap, Td). Get 1 dose of   Tdap vaccine if you are younger than age 65, are over 65 and have contact with an infant, are a healthcare worker, are pregnant, or simply want to be protected from whooping cough. After that, you need a Td booster dose every 10 years. Consult your caregiver if you have not had at least 3 tetanus and diphtheria-containing shots sometime in your life or have a deep or dirty wound.   HPV. You need this vaccine if you are a woman age 26 or younger. The vaccine is given in 3 doses over 6 months.   Measles, mumps, rubella (MMR). You need at least 1 dose of MMR if you were born in 1957 or later. You may also need a second dose.   Meningococcal. If you are age 19 to 21 and a first-year college student living in a residence hall, or have one of several medical conditions, you need to get vaccinated against meningococcal disease. You may also need additional booster doses.   Zoster (shingles). If you are age 60 or older, you should get this vaccine.   Varicella (chickenpox). If you have never had chickenpox or you were vaccinated but received only 1 dose, talk to your caregiver to find out if you need this vaccine.   Hepatitis A. You need this vaccine if you have a specific risk factor for hepatitis A virus infection or you simply wish to be protected from this disease. The vaccine is usually given as 2 doses, 6 to 18 months apart.   Hepatitis B. You need this vaccine if you have a specific risk factor for hepatitis B virus infection or you simply wish to be protected from this disease. The vaccine is given in 3 doses, usually over 6 months.  Preventive Services /  Frequency Ages 19 to 39  Blood pressure check.** / Every 1 to 2 years.   Lipid and cholesterol check.** / Every 5 years beginning at age 20.   Clinical breast exam.** / Every 3 years for women in their 20s and 30s.   Pap test.** / Every 2 years from ages 21 through 29. Every 3 years starting at age 30 through age 65 or 70 with a history of 3 consecutive normal Pap tests.   HPV screening.** / Every 3 years from ages 30 through ages 65 to 70 with a history of 3 consecutive normal Pap tests.   Hepatitis C blood test.** / For any individual with known risks for hepatitis C.   Skin self-exam. / Monthly.   Influenza immunization.** / Every year.   Pneumococcal polysaccharide immunization.** / 1 to 2 doses if you smoke cigarettes or if you have certain chronic medical conditions.   Tetanus, diphtheria, pertussis (Tdap, Td) immunization. / A one-time dose of Tdap vaccine. After that, you need a Td booster dose every 10 years.   HPV immunization. / 3 doses over 6 months, if you are 26 and younger.   Measles, mumps, rubella (MMR) immunization. / You need at least 1 dose of MMR if you were born in 1957 or later. You may also need a second dose.   Meningococcal immunization. / 1 dose if you are age 19 to 21 and a first-year college student living in a residence hall, or have one of several medical conditions, you need to get vaccinated against meningococcal disease. You may also need additional booster doses.   Varicella immunization.** / Consult your caregiver.   Hepatitis A immunization.** / Consult your caregiver. 2 doses, 6 to 18 months   apart.   Hepatitis B immunization.** / Consult your caregiver. 3 doses usually over 6 months.  Ages 40 to 64  Blood pressure check.** / Every 1 to 2 years.   Lipid and cholesterol check.** / Every 5 years beginning at age 20.   Clinical breast exam.** / Every year after age 40.   Mammogram.** / Every year beginning at age 40 and continuing for as  long as you are in good health. Consult with your caregiver.   Pap test.** / Every 3 years starting at age 30 through age 65 or 70 with a history of 3 consecutive normal Pap tests.   HPV screening.** / Every 3 years from ages 30 through ages 65 to 70 with a history of 3 consecutive normal Pap tests.   Fecal occult blood test (FOBT) of stool. / Every year beginning at age 50 and continuing until age 75. You may not need to do this test if you get a colonoscopy every 10 years.   Flexible sigmoidoscopy or colonoscopy.** / Every 5 years for a flexible sigmoidoscopy or every 10 years for a colonoscopy beginning at age 50 and continuing until age 75.   Hepatitis C blood test.** / For all people born from 1945 through 1965 and any individual with known risks for hepatitis C.   Skin self-exam. / Monthly.   Influenza immunization.** / Every year.   Pneumococcal polysaccharide immunization.** / 1 to 2 doses if you smoke cigarettes or if you have certain chronic medical conditions.   Tetanus, diphtheria, pertussis (Tdap, Td) immunization.** / A one-time dose of Tdap vaccine. After that, you need a Td booster dose every 10 years.   Measles, mumps, rubella (MMR) immunization. / You need at least 1 dose of MMR if you were born in 1957 or later. You may also need a second dose.   Varicella immunization.** / Consult your caregiver.   Meningococcal immunization.** / Consult your caregiver.   Hepatitis A immunization.** / Consult your caregiver. 2 doses, 6 to 18 months apart.   Hepatitis B immunization.** / Consult your caregiver. 3 doses, usually over 6 months.  Ages 65 and over  Blood pressure check.** / Every 1 to 2 years.   Lipid and cholesterol check.** / Every 5 years beginning at age 20.   Clinical breast exam.** / Every year after age 40.   Mammogram.** / Every year beginning at age 40 and continuing for as long as you are in good health. Consult with your caregiver.   Pap test.** /  Every 3 years starting at age 30 through age 65 or 70 with a 3 consecutive normal Pap tests. Testing can be stopped between 65 and 70 with 3 consecutive normal Pap tests and no abnormal Pap or HPV tests in the past 10 years.   HPV screening.** / Every 3 years from ages 30 through ages 65 or 70 with a history of 3 consecutive normal Pap tests. Testing can be stopped between 65 and 70 with 3 consecutive normal Pap tests and no abnormal Pap or HPV tests in the past 10 years.   Fecal occult blood test (FOBT) of stool. / Every year beginning at age 50 and continuing until age 75. You may not need to do this test if you get a colonoscopy every 10 years.   Flexible sigmoidoscopy or colonoscopy.** / Every 5 years for a flexible sigmoidoscopy or every 10 years for a colonoscopy beginning at age 50 and continuing until age 75.   Hepatitis   C blood test.** / For all people born from 1945 through 1965 and any individual with known risks for hepatitis C.   Osteoporosis screening.** / A one-time screening for women ages 65 and over and women at risk for fractures or osteoporosis.   Skin self-exam. / Monthly.   Influenza immunization.** / Every year.   Pneumococcal polysaccharide immunization.** / 1 dose at age 65 (or older) if you have never been vaccinated.   Tetanus, diphtheria, pertussis (Tdap, Td) immunization. / A one-time dose of Tdap vaccine if you are over 65 and have contact with an infant, are a healthcare worker, or simply want to be protected from whooping cough. After that, you need a Td booster dose every 10 years.   Varicella immunization.** / Consult your caregiver.   Meningococcal immunization.** / Consult your caregiver.   Hepatitis A immunization.** / Consult your caregiver. 2 doses, 6 to 18 months apart.   Hepatitis B immunization.** / Check with your caregiver. 3 doses, usually over 6 months.  ** Family history and personal history of risk and conditions may change your caregiver's  recommendations. Document Released: 08/07/2001 Document Revised: 05/31/2011 Document Reviewed: 11/06/2010 [employer] Patient Information 2012 [employer]. 

## 2011-08-27 NOTE — Assessment & Plan Note (Signed)
Recheck US

## 2011-08-29 ENCOUNTER — Telehealth: Payer: Self-pay

## 2011-08-29 ENCOUNTER — Encounter: Payer: Self-pay | Admitting: *Deleted

## 2011-08-29 NOTE — Telephone Encounter (Signed)
Message copied by Maurice Small on Wed Aug 29, 2011  4:47 PM ------      Message from: Lelon Perla      Created: Wed Aug 29, 2011 11:49 AM       Neg culture

## 2011-08-29 NOTE — Telephone Encounter (Signed)
Opened in error. Letter already mailed to patient

## 2011-08-30 ENCOUNTER — Telehealth: Payer: Self-pay

## 2011-08-30 ENCOUNTER — Emergency Department (HOSPITAL_COMMUNITY)
Admission: EM | Admit: 2011-08-30 | Discharge: 2011-08-30 | Disposition: A | Discharge status: Home / Self Care | Payer: Self-pay | Attending: Emergency Medicine | Admitting: Emergency Medicine

## 2011-08-30 ENCOUNTER — Encounter (HOSPITAL_COMMUNITY): Payer: Self-pay | Admitting: *Deleted

## 2011-08-30 ENCOUNTER — Emergency Department (HOSPITAL_COMMUNITY): Payer: Self-pay

## 2011-08-30 DIAGNOSIS — D259 Leiomyoma of uterus, unspecified: Secondary | ICD-10-CM | POA: Insufficient documentation

## 2011-08-30 DIAGNOSIS — R10819 Abdominal tenderness, unspecified site: Secondary | ICD-10-CM | POA: Insufficient documentation

## 2011-08-30 DIAGNOSIS — M545 Low back pain, unspecified: Secondary | ICD-10-CM | POA: Insufficient documentation

## 2011-08-30 DIAGNOSIS — N200 Calculus of kidney: Secondary | ICD-10-CM

## 2011-08-30 DIAGNOSIS — K7689 Other specified diseases of liver: Secondary | ICD-10-CM | POA: Insufficient documentation

## 2011-08-30 DIAGNOSIS — R7989 Other specified abnormal findings of blood chemistry: Secondary | ICD-10-CM

## 2011-08-30 DIAGNOSIS — N201 Calculus of ureter: Secondary | ICD-10-CM | POA: Insufficient documentation

## 2011-08-30 LAB — URINALYSIS, ROUTINE W REFLEX MICROSCOPIC
Bilirubin Urine: NEGATIVE
Glucose, UA: NEGATIVE mg/dL
Ketones, ur: NEGATIVE mg/dL
pH: 5.5 (ref 5.0–8.0)

## 2011-08-30 LAB — CBC
HCT: 37.4 % (ref 36.0–46.0)
MCH: 28.8 pg (ref 26.0–34.0)
MCHC: 34 g/dL (ref 30.0–36.0)
MCV: 84.8 fL (ref 78.0–100.0)
Platelets: 123 10*3/uL — ABNORMAL LOW (ref 150–400)
RDW: 12.8 % (ref 11.5–15.5)

## 2011-08-30 LAB — URINE MICROSCOPIC-ADD ON

## 2011-08-30 LAB — DIFFERENTIAL
Basophils Absolute: 0 10*3/uL (ref 0.0–0.1)
Eosinophils Absolute: 0 10*3/uL (ref 0.0–0.7)
Eosinophils Relative: 0 % (ref 0–5)
Monocytes Absolute: 0.6 10*3/uL (ref 0.1–1.0)

## 2011-08-30 LAB — POCT I-STAT, CHEM 8
Calcium, Ion: 1.16 mmol/L (ref 1.12–1.32)
Hemoglobin: 10.5 g/dL — ABNORMAL LOW (ref 12.0–15.0)
Sodium: 141 mEq/L (ref 135–145)
TCO2: 22 mmol/L (ref 0–100)

## 2011-08-30 LAB — POCT PREGNANCY, URINE: Preg Test, Ur: NEGATIVE

## 2011-08-30 MED ORDER — OXYCODONE HCL 20 MG PO TB12: 20.0000 mg | ORAL_TABLET | Freq: Two times a day (BID) | ORAL | Status: AC

## 2011-08-30 MED ORDER — IBUPROFEN 200 MG PO TABS: 600.0000 mg | ORAL_TABLET | Freq: Four times a day (QID) | ORAL | Status: AC | PRN

## 2011-08-30 NOTE — Telephone Encounter (Signed)
Msg left to call the office     KP 

## 2011-08-30 NOTE — Progress Notes (Signed)
Pt listed as self pay with no insurance coverage Pt confirms she [place] resident but is awaiting [employer] coverage effective 09/24/11 from her job.  PCP [facility]

## 2011-08-30 NOTE — ED Notes (Signed)
MD at bedside. 

## 2011-08-30 NOTE — Telephone Encounter (Signed)
Patient notified of urine culture and lab results.  She states she is still having issues with frequency and urgency.  It is so bad that it woke her up in the night.  Is there anything else she can do?

## 2011-08-30 NOTE — ED Provider Notes (Signed)
I saw and evaluated the patient, reviewed the resident's note and I agree with the findings and plan.   .Face to face Exam:  General:  Awake HEENT:  Atraumatic Resp:  Normal effort Abd:  Nondistended Neuro:No focal weakness Lymph: No adenopathy   Nelia Shi, MD 08/30/11 [id number]

## 2011-08-30 NOTE — ED Notes (Signed)
Patient's first sent specimen did not have enough specimen to complete pregnancy RN adware

## 2011-08-30 NOTE — ED Provider Notes (Addendum)
History     CSN: [id number]  Arrival date & time 08/30/11  1222   First MD Initiated Contact with Patient 08/30/11 1336      Chief Complaint  Patient presents with  . Abdominal Pain  . Vaginal bleeding  .     (Consider location/radiation/quality/duration/timing/severity/associated sxs/prior Treatment)  HPI  Patient is 46 yo lady with PMH of depression, who present with abdominal pain. Per patient, she had sudden onset of abdominal pain at 7:00 AM yesterday. Her abdominal pain is located at the left lower abdomen, aching-like pain, 8 to 9/10 in severity. She took two pills of ibuprofen.The pain lasted for about 10 min and then completely resolved. She did not have pain until this morning. She had another episode of similar abdominal pain at 10:00 AM this morning, which is worse than yesterday, and lasted for about one hour, radiated to her back on the left side. Her pain is associated with pressure-like feeling in her vagina. She took two pills of ibuprofen. Her pain gradually subsided to about 3/10 in severity. She denies fever, chills, nausea, vomiting, diarrhea or constipation. She did not notice any blood from her urine. But she noticed small amount of bright red vaginal blood. She denies dysuria, burning or urinary frequency.   Of note, patient reports that she had Endometrial ablation for inducing secession of period 5 years ago. After that surgery, she has not had menstrual period until last Friday. She had one episode of vaginal bleeding in last Friday, which required use of 2 pads. She also had sharp abdominal pain at left lower abdomen on last Friday. Patient had tubal ligation 10 years ago. She is sexually active. She denies vaginal discharge.   Denies headaches,  cough, chest pain, SOB,  joint pain or leg swelling.   Past Surgical History  Procedure Date  . Endometrial ablation   . Laparoscopy   . Tubal ligation     Family History  Problem Relation Age of Onset  .  Diabetes Mother   . Hypertension Mother   . Hyperlipidemia Mother   . Diabetes Sister   . Hypertension Father   . Hyperlipidemia Father   . Ovarian cancer Maternal Grandmother   . Colon cancer Paternal Grandfather   . Breast cancer Maternal Aunt     History  Substance Use Topics  . Smoking status: Never Smoker   . Smokeless tobacco: Never Used  . Alcohol Use: No    OB History    Grav Para Term Preterm Abortions TAB SAB Ect Mult Living                 Review of Systems  Constitutional: Negative for fever, chills and fatigue.  HENT: Negative for ear pain, congestion, sore throat, sneezing, mouth sores, trouble swallowing, neck pain, neck stiffness and ear discharge.   Eyes: Negative for photophobia, pain, discharge and visual disturbance.  Respiratory: Negative for apnea, cough, chest tightness, shortness of breath and wheezing.   Cardiovascular: Negative for chest pain, palpitations and leg swelling.  Gastrointestinal: Positive for abdominal pain. Negative for nausea, vomiting, diarrhea, constipation, blood in stool, abdominal distention, anal bleeding and rectal pain.  Genitourinary: Positive for vaginal bleeding and menstrual problem. Negative for dysuria, urgency, frequency, decreased urine volume, vaginal discharge and vaginal pain.       Patient has pressure-like feeling in her vagina.  Musculoskeletal: Positive for back pain. Negative for joint swelling, arthralgias and gait problem.  Skin: Negative for pallor, rash and wound.  Neurological:  Negative for dizziness, tremors, seizures, syncope, facial asymmetry, speech difficulty, weakness, light-headedness and numbness.  Hematological: Negative for adenopathy. Does not bruise/bleed easily.  Psychiatric/Behavioral: Negative for hallucinations, behavioral problems and agitation.    Allergies  Review of patient's allergies indicates no known allergies.  Home Medications   Current Outpatient Rx  Name Route Sig Dispense  Refill  . CITALOPRAM HYDROBROMIDE 10 MG PO TABS Oral Take 1 tablet (10 mg total) by mouth daily. 30 tablet 2  . IBUPROFEN 200 MG PO TABS Oral Take 400 mg by mouth every 6 (six) hours as needed. For pain    . MULTIVITAMINS PO TABS Oral Take 1 tablet by mouth daily.      BP 159/104  Pulse 85  Temp(Src) 98.8 F (37.1 C) (Oral)  Resp 20  SpO2 100%  Physical Exam  General: resting in bed, not in acute distress HEENT: PERRL, EOMI, no scleral icterus Cardiac: S1/S2, RRR, No murmurs, gallops or rubs Pulm: Good air movement bilaterally, Clear to auscultation bilaterally, No rales, wheezing, rhonchi or rubs. Abd: Soft,  Nondistended, tender over left lower and left mid quadrants on palpation, no rebound pain, no organomegaly, BS present. Tender over left CVA upon tapping.  Ext: No rashes or edema, 2+DP/PT pulse bilaterally Neuro: alert and oriented X3, cranial nerves II-XII grossly intact, muscle strength 5/5 in all extremities,  sensation to light touch intact.   ED Course  Procedures (including critical care time)  CT-abd & pelvic without contrast: Acute obstructive uropathy on the left with a 4-5 mm left UVJ calculus. Adnexa within normal limits. Lobulated appearance of the uterus suggesting multiple fibroids.  CBC with diff: no leukocytosis Stat chem 8: normal electrolytes and Creatinine UA: negative for nitrite and leukocytes, positive for RBC (11-20 RBC/HPF) Pregnancy test: negative  Labs Reviewed  URINALYSIS, ROUTINE W REFLEX MICROSCOPIC - Abnormal; Notable for the following:    APPearance CLOUDY (*)    Hgb urine dipstick LARGE (*)    Protein, ur 30 (*)    All other components within normal limits  URINE MICROSCOPIC-ADD ON - Abnormal; Notable for the following:    Crystals CA OXALATE CRYSTALS (*)    All other components within normal limits  CBC  DIFFERENTIAL  PREGNANCY, URINE   No results found.   No diagnosis found.    MDM   Patient has  4-5 mm left UVJ calculus.  This small sized stone will likely pass out spontaneously. She will be discharged on Ibuprofen and oxycodone for pain. She is given referral to Urology. Patient will be provide with a urinary strainer to collect stone for further analysis if she can pass the stone.       Lorretta Harp, MD 08/30/11 1533  Lorretta Harp, MD 08/30/11 [phone]

## 2011-08-30 NOTE — ED Notes (Signed)
Pt reports low back pain, dysuria and vaginal pressure since Monday.  Went to see her PMD Monday and states that her UA was clear

## 2011-08-30 NOTE — ED Notes (Signed)
Patient transported to Ultrasound 

## 2011-08-30 NOTE — Telephone Encounter (Signed)
Sugar was slightly elevated---  Repeat fasting bmp, hgba1c  790.6 Since no uti--we can consider using meds for incontinence or urology referral..

## 2011-08-30 NOTE — Discharge Instructions (Signed)
    You have a small kidney stone (4 to 5 mm in size). It may be spontaneously passed out. You will be provided with a urinary strainer. Please use this strainer to collect your kidney stone for further analysis, in case you can pass it out.   I give you two pain medication prescriptions for pain. Please take it as prescribed.  You are given a referral to Urology. Please follow up with urologist.  Kidney Stones Kidney stones (ureteral lithiasis) are solid masses that form inside your kidneys. The intense pain is caused by the stone moving through the kidney, ureter, bladder, and urethra (urinary tract). When the stone moves, the ureter starts to spasm around the stone. The stone is usually passed in the urine.  HOME CARE  Drink enough fluids to keep your pee (urine) clear or pale yellow. This helps to get the stone out.   Strain all pee through the provided strainer. Do not pee without peeing through the strainer, not even once. If you pee the stone out, catch it. The stone may be as small as a grain of salt. Take this to your doctor.   Only take medicine as told by your doctor.   Follow up with your doctor as told.   Get follow-up X-rays as told by your doctor.  GET HELP RIGHT AWAY IF:   Your pain does not get better with medicine.   You have a fever.   Your pain increases and gets worse over 18 hours.   You have new belly (abdominal) pain.   You feel faint or pass out.  MAKE SURE YOU:   Understand these instructions.   Will watch your condition.   Will get help right away if you are not doing well or get worse.  Document Released: 11/28/2007 Document Revised: 05/31/2011 Document Reviewed: 04/08/2009 [facility] Patient Information 2012 [place], [place].

## 2011-08-31 NOTE — ED Provider Notes (Signed)
I saw and evaluated the patient, reviewed the resident's note and I agree with the findings and plan.   .Face to face Exam:  General:  Awake HEENT:  Atraumatic Resp:  Normal effort Abd:  Nondistended Neuro:No focal weakness Lymph: No adenopathy   Nelia Shi, MD 08/31/11 [phone]

## 2011-08-31 NOTE — Telephone Encounter (Signed)
Discussed with patient and she stated she was in so much pain that she went tot he ED and she had a Kidney stone. She stated she was already referred to a Urologist. Discussed labs and she will come in on Monday to have them done.     KP

## 2011-09-03 ENCOUNTER — Other Ambulatory Visit (INDEPENDENT_AMBULATORY_CARE_PROVIDER_SITE_OTHER): Payer: Self-pay

## 2011-09-03 DIAGNOSIS — R7989 Other specified abnormal findings of blood chemistry: Secondary | ICD-10-CM

## 2011-09-03 LAB — HEMOGLOBIN A1C: Hgb A1c MFr Bld: 6.7 % — ABNORMAL HIGH (ref 4.6–6.5)

## 2011-09-03 LAB — BASIC METABOLIC PANEL
Calcium: 9.5 mg/dL (ref 8.4–10.5)
Creatinine, Ser: 0.5 mg/dL (ref 0.4–1.2)
GFR: 166.31 mL/min (ref >60.00)
Glucose, Bld: 99 mg/dL (ref 70–99)
Sodium: 140 mEq/L (ref 135–145)

## 2011-09-04 ENCOUNTER — Other Ambulatory Visit: Payer: Self-pay

## 2011-09-11 ENCOUNTER — Other Ambulatory Visit: Payer: Self-pay

## 2011-09-12 ENCOUNTER — Encounter (INDEPENDENT_AMBULATORY_CARE_PROVIDER_SITE_OTHER): Payer: Self-pay | Admitting: Obstetrics and Gynecology

## 2011-09-12 ENCOUNTER — Ambulatory Visit
Admission: RE | Admit: 2011-09-12 | Discharge: 2011-09-12 | Disposition: A | Discharge status: Home / Self Care | Payer: No Typology Code available for payment source | Source: Ambulatory Visit | Attending: Family Medicine | Admitting: Family Medicine

## 2011-09-12 ENCOUNTER — Telehealth: Payer: Self-pay | Admitting: *Deleted

## 2011-09-12 DIAGNOSIS — D259 Leiomyoma of uterus, unspecified: Secondary | ICD-10-CM

## 2011-09-12 DIAGNOSIS — R599 Enlarged lymph nodes, unspecified: Secondary | ICD-10-CM

## 2011-09-12 DIAGNOSIS — N926 Irregular menstruation, unspecified: Secondary | ICD-10-CM

## 2011-09-12 DIAGNOSIS — E041 Nontoxic single thyroid nodule: Secondary | ICD-10-CM

## 2011-09-12 DIAGNOSIS — Z8639 Personal history of other endocrine, nutritional and metabolic disease: Secondary | ICD-10-CM

## 2011-09-12 NOTE — Telephone Encounter (Signed)
Message copied by Verdene Rio on Wed Sep 12, 2011  6:15 PM ------      Message from: Lelon Perla      Created: Wed Sep 12, 2011  3:37 PM       Enlarging nodules---have pt seen ENT for this in past?  We need to refer her to see if further eval needed.

## 2011-09-12 NOTE — Telephone Encounter (Signed)
Discuss with patient  

## 2011-09-24 ENCOUNTER — Encounter (HOSPITAL_COMMUNITY): Payer: Self-pay | Admitting: Respiratory Therapy

## 2011-09-26 NOTE — H&P (Signed)
  Felicia Solis is an 46 y.o. female.   Chief Complaint: Thyroid mass  HPI: Bilateral thyroid nodules, follicular cells on past FNA, and the right side has almost doubled in size.  No past medical history on file.  Past Surgical History  Procedure Date  . Endometrial ablation   . Laparoscopy   . Tubal ligation     Family History  Problem Relation Age of Onset  . Diabetes Mother   . Hypertension Mother   . Hyperlipidemia Mother   . Diabetes Sister   . Hypertension Father   . Hyperlipidemia Father   . Ovarian cancer Maternal Grandmother   . Colon cancer Paternal Grandfather   . Breast cancer Maternal Aunt    Social History:  reports that she has never smoked. She has never used smokeless tobacco. She reports that she does not drink alcohol or use illicit drugs.  Allergies: No Known Allergies  No current facility-administered medications on file as of .   Medications Prior to Admission  Medication Sig Dispense Refill  . citalopram (CELEXA) 10 MG tablet Take 1 tablet (10 mg total) by mouth daily.  30 tablet  2  . ibuprofen (ADVIL,MOTRIN) 200 MG tablet Take 400 mg by mouth every 6 (six) hours as needed. For pain      . multivitamin (THERAGRAN) per tablet Take 1 tablet by mouth daily.        No results found for this or any previous visit (from the past 48 hour(s)). No results found.  ROS: otherwise negative  There were no vitals taken for this visit.  PHYSICAL EXAM: Overall appearance:  Healthy appearing, in no distress Head:  Normocephalic, atraumatic. Ears: External auditory canals are clear; tympanic membranes are intact and the middle ears are free of any effusion. Nose: External nose is healthy in appearance. Internal nasal exam free of any lesions or obstruction. Oral Cavity:  There are no mucosal lesions or masses identified. Oral Pharynx/Hypopharynx/Larynx: no signs of any mucosal lesions or masses identified. Vocal cords move normally. Neuro:  No identifiable  neurologic deficits. Neck: No palpable neck masses. Bilateral thyroid nodules.  Studies Reviewed: none    Assessment/Plan Recommend right thyroid lobectomy with frozen section and possible total thyroidectomy.  Felicia Solis 09/26/2011, 4:46 PM

## 2011-09-27 ENCOUNTER — Encounter (HOSPITAL_COMMUNITY)
Admission: RE | Admit: 2011-09-27 | Discharge: 2011-09-27 | Disposition: A | Discharge status: Home / Self Care | Payer: BC Managed Care – PPO | Source: Ambulatory Visit | Attending: Otolaryngology | Admitting: Otolaryngology

## 2011-09-27 ENCOUNTER — Other Ambulatory Visit: Payer: Self-pay

## 2011-09-27 ENCOUNTER — Encounter (HOSPITAL_COMMUNITY): Payer: Self-pay

## 2011-09-27 ENCOUNTER — Encounter (HOSPITAL_COMMUNITY)
Admission: RE | Admit: 2011-09-27 | Discharge: 2011-09-27 | Disposition: A | Discharge status: Home / Self Care | Payer: BC Managed Care – PPO | Source: Ambulatory Visit | Attending: Anesthesiology | Admitting: Anesthesiology

## 2011-09-27 HISTORY — DX: Shortness of breath: R06.02

## 2011-09-27 HISTORY — DX: Depression, unspecified: F32.A

## 2011-09-27 HISTORY — DX: Anemia, unspecified: D64.9

## 2011-09-27 HISTORY — DX: Major depressive disorder, single episode, unspecified: F32.9

## 2011-09-27 LAB — BASIC METABOLIC PANEL
BUN: 8 mg/dL (ref 6–23)
Calcium: 9.6 mg/dL (ref 8.4–10.5)
GFR calc Af Amer: >90 mL/min (ref >90)
GFR calc non Af Amer: >90 mL/min (ref >90)
Glucose, Bld: 88 mg/dL (ref 70–99)
Potassium: 3.8 mEq/L (ref 3.5–5.1)
Sodium: 138 mEq/L (ref 135–145)

## 2011-09-27 LAB — SURGICAL PCR SCREEN
MRSA, PCR: NEGATIVE
Staphylococcus aureus: NEGATIVE

## 2011-09-27 LAB — CBC
Hemoglobin: 13.2 g/dL (ref 12.0–15.0)
MCH: 28.6 pg (ref 26.0–34.0)
MCHC: 34.6 g/dL (ref 30.0–36.0)
Platelets: 285 10*3/uL (ref 150–400)
RDW: 13 % (ref 11.5–15.5)

## 2011-09-27 LAB — HCG, SERUM, QUALITATIVE: Preg, Serum: NEGATIVE

## 2011-09-27 MED ORDER — CEFAZOLIN SODIUM-DEXTROSE 2-3 GM-% IV SOLR
2.0000 g | INTRAVENOUS | Status: DC
  Filled 2011-09-27: qty 50

## 2011-09-27 NOTE — Consult Note (Signed)
Anesthesia:  Patient is a 46 year old female scheduled for a right thyroid lobectomy, possible total thyroidectomy for thyroid mass on 09/28/11.  PAT visit was on 09/27/11.  History includes anemia, depression, asthma, SOB, non-smoker, obesity with BMI 37.86, TB s/p treatment, headaches.    During her PAT visit this afternoon she reported history of chest pains that have occurred on and off for years--usually 3-4 times per year.  Her last episode was last night.  She has been evaluated in the ED a few times for these pains and was told everything was okay, so she stopped seeking medical attention for these.  She has never been evaluated by a Cardiologist and denies history of prior stress, echo, or cath.  She recently began seeing Dr. Laury Axon and has not discussed these symptoms with her.  Chest pains are described at throbbing, mid chest, that last approximately 1 hours.  She got some relief by stretching.  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  In January when her father was in the hospital she was able to walk to the 5th floor, but had to rest at about the 3 floor.  She has not had any chest pains while doing any of these type activities.  She denies history of HTN, DM, CAD, CHF.  Her mother does have a history of CAD with stents placed in her mid-50's.  She denies SOB at rest unless she is feeling particularly anxious.   Exam shows stable VS.  Heart with a RRR, no murmur or carotid bruit auscultated.  Her neck is full, thyroid goiter.  Lungs are clear. No LE edema noted.  EKG shows NSR.  Labs and CXR are pending.  I think her CP is atypical for angina, particularly since she is able to do fairly strenuous activity with cardiac symptoms.  I have encouraged her to discuss this with her PCP, however, and to seek emergency medical evaluation if any worsening/persistent symptoms.  If no new symptoms, then anticipate she can proceed.  Reviewed with Anesthesiologist Dr.  Katrinka Blazing who agrees.

## 2011-09-27 NOTE — Progress Notes (Signed)
Pt c/o vague chest pain .Marland Kitchen Off and on x many years ... No tests ... No doctor... No eval of this chest pain .   Revonda Standard PA will see this patient ... This afternoon ... After ekg  cxr and labs are obtained.

## 2011-09-27 NOTE — Pre-Procedure Instructions (Signed)
20 Felicia Solis  09/27/2011   Your procedure is scheduled on:  09/28/11  Report to [facility] at 0530 AM.  Call this number if you have problems the morning of surgery: [phone]   Remember:   Do not eat food:After Midnight.  May have clear liquids: up to 4 Hours before arrival.  No drinking after 0130 am day of surgery.   Clear liquids include soda, tea, black coffee, apple or grape juice, broth.  Take these medicines the morning of surgery with A SIP OF WATER: celexa      Do not wear jewelry, make-up or nail polish.  Do not wear lotions, powders, or perfumes. You may wear deodorant.  Do not shave 48 hours prior to surgery.  Do not bring valuables to the hospital.  Contacts, dentures or bridgework may not be worn into surgery.  Leave suitcase in the car. After surgery it may be brought to your room.  For patients admitted to the hospital, checkout time is 11:00 AM the day of discharge.   Patients discharged the day of surgery will not be allowed to drive home.  Name and phone number of your driver:   Special Instructions: CHG Shower Use Special Wash: 1/2 bottle night before surgery and 1/2 bottle morning of surgery.   Please read over the following fact sheets that you were given: Pain Booklet, Coughing and Deep Breathing, MRSA Information and Surgical Site Infection Prevention

## 2011-09-28 ENCOUNTER — Encounter (HOSPITAL_COMMUNITY): Payer: Self-pay | Admitting: Vascular Surgery

## 2011-09-28 ENCOUNTER — Ambulatory Visit (HOSPITAL_COMMUNITY)
Admission: RE | Admit: 2011-09-28 | Discharge: 2011-09-29 | Disposition: A | Discharge status: Home / Self Care | Payer: BC Managed Care – PPO | Source: Ambulatory Visit | Attending: Otolaryngology | Admitting: Otolaryngology

## 2011-09-28 ENCOUNTER — Encounter (HOSPITAL_COMMUNITY)
Admission: RE | Disposition: A | Discharge status: Home / Self Care | Payer: Self-pay | Source: Ambulatory Visit | Attending: Otolaryngology

## 2011-09-28 ENCOUNTER — Ambulatory Visit (HOSPITAL_COMMUNITY): Payer: BC Managed Care – PPO | Admitting: Vascular Surgery

## 2011-09-28 DIAGNOSIS — F32A Depression, unspecified: Secondary | ICD-10-CM

## 2011-09-28 DIAGNOSIS — Z01812 Encounter for preprocedural laboratory examination: Secondary | ICD-10-CM | POA: Insufficient documentation

## 2011-09-28 DIAGNOSIS — D34 Benign neoplasm of thyroid gland: Secondary | ICD-10-CM | POA: Insufficient documentation

## 2011-09-28 DIAGNOSIS — F329 Major depressive disorder, single episode, unspecified: Secondary | ICD-10-CM

## 2011-09-28 HISTORY — PX: THYROIDECTOMY: SHX17

## 2011-09-28 SURGERY — THYROIDECTOMY
Anesthesia: General | Site: Neck | Laterality: Right | Wound class: Clean

## 2011-09-28 MED ORDER — GLYCOPYRROLATE 0.2 MG/ML IJ SOLN
INTRAMUSCULAR | Status: DC | PRN
  Administered 2011-09-28: .8 mg via INTRAVENOUS

## 2011-09-28 MED ORDER — ONDANSETRON HCL 4 MG/2ML IJ SOLN
INTRAMUSCULAR | Status: DC | PRN
  Administered 2011-09-28 (×2): 4 mg via INTRAVENOUS

## 2011-09-28 MED ORDER — WHITE PETROLATUM GEL
Status: AC
  Filled 2011-09-28: qty 5

## 2011-09-28 MED ORDER — DEXTROSE-NACL 5-0.9 % IV SOLN
INTRAVENOUS | Status: DC
  Administered 2011-09-29: 01:00:00 via INTRAVENOUS

## 2011-09-28 MED ORDER — ROCURONIUM BROMIDE 100 MG/10ML IV SOLN
INTRAVENOUS | Status: DC | PRN
  Administered 2011-09-28: 50 mg via INTRAVENOUS

## 2011-09-28 MED ORDER — PROMETHAZINE HCL 25 MG RE SUPP: 25.0000 mg | Freq: Four times a day (QID) | RECTAL | Status: DC | PRN

## 2011-09-28 MED ORDER — CEFAZOLIN SODIUM 1-5 GM-% IV SOLN
INTRAVENOUS | Status: DC | PRN
  Administered 2011-09-28: 2 g via INTRAVENOUS

## 2011-09-28 MED ORDER — PROMETHAZINE HCL 25 MG PO TABS: 25.0000 mg | ORAL_TABLET | Freq: Four times a day (QID) | ORAL | Status: DC | PRN

## 2011-09-28 MED ORDER — PHENYLEPHRINE HCL 10 MG/ML IJ SOLN
INTRAMUSCULAR | Status: DC | PRN
  Administered 2011-09-28 (×3): 80 ug via INTRAVENOUS

## 2011-09-28 MED ORDER — SUFENTANIL CITRATE 50 MCG/ML IV SOLN
INTRAVENOUS | Status: DC | PRN
  Administered 2011-09-28 (×2): 20 ug via INTRAVENOUS

## 2011-09-28 MED ORDER — SCOPOLAMINE 1 MG/3DAYS TD PT72
MEDICATED_PATCH | TRANSDERMAL | Status: DC | PRN
  Administered 2011-09-28: 1 via TRANSDERMAL

## 2011-09-28 MED ORDER — HYDROMORPHONE HCL PF 1 MG/ML IJ SOLN
0.2500 mg | INTRAMUSCULAR | Status: DC | PRN
  Administered 2011-09-28 (×2): 0.5 mg via INTRAVENOUS

## 2011-09-28 MED ORDER — LIDOCAINE HCL 4 % MT SOLN
OROMUCOSAL | Status: DC | PRN
  Administered 2011-09-28: 4 mL via TOPICAL

## 2011-09-28 MED ORDER — ACETAMINOPHEN 500 MG PO TABS: 1000.0000 mg | ORAL_TABLET | Freq: Four times a day (QID) | ORAL | Status: DC | PRN

## 2011-09-28 MED ORDER — HYDROCODONE-ACETAMINOPHEN 7.5-500 MG PO TABS: 1.0000 | ORAL_TABLET | Freq: Four times a day (QID) | ORAL | Status: AC | PRN

## 2011-09-28 MED ORDER — LIDOCAINE-EPINEPHRINE 1 %-1:100000 IJ SOLN: INTRAMUSCULAR | Status: DC | PRN

## 2011-09-28 MED ORDER — NEOSTIGMINE METHYLSULFATE 1 MG/ML IJ SOLN
INTRAMUSCULAR | Status: DC | PRN
  Administered 2011-09-28: 5 mg via INTRAVENOUS

## 2011-09-28 MED ORDER — LIDOCAINE HCL (CARDIAC) 20 MG/ML IV SOLN
INTRAVENOUS | Status: DC | PRN
  Administered 2011-09-28: 100 mg via INTRAVENOUS

## 2011-09-28 MED ORDER — 0.9 % SODIUM CHLORIDE (POUR BTL) OPTIME
TOPICAL | Status: DC | PRN
  Administered 2011-09-28: 1000 mL

## 2011-09-28 MED ORDER — PROPOFOL 10 MG/ML IV EMUL
INTRAVENOUS | Status: DC | PRN
  Administered 2011-09-28: 200 mg via INTRAVENOUS

## 2011-09-28 MED ORDER — MIDAZOLAM HCL 5 MG/5ML IJ SOLN
INTRAMUSCULAR | Status: DC | PRN
  Administered 2011-09-28: 2 mg via INTRAVENOUS

## 2011-09-28 MED ORDER — DEXAMETHASONE SODIUM PHOSPHATE 4 MG/ML IJ SOLN
INTRAMUSCULAR | Status: DC | PRN
  Administered 2011-09-28: 8 mg via INTRAVENOUS

## 2011-09-28 MED ORDER — DROPERIDOL 2.5 MG/ML IJ SOLN
0.6250 mg | INTRAMUSCULAR | Status: DC | PRN
  Administered 2011-09-28: 0.625 mg via INTRAVENOUS

## 2011-09-28 MED ORDER — LACTATED RINGERS IV SOLN
INTRAVENOUS | Status: DC | PRN
  Administered 2011-09-28 (×2): via INTRAVENOUS

## 2011-09-28 MED ORDER — ADULT MULTIVITAMIN W/MINERALS CH
1.0000 | ORAL_TABLET | Freq: Every day | ORAL | Status: DC
  Administered 2011-09-28 – 2011-09-29 (×2): 1 via ORAL
  Filled 2011-09-28 (×2): qty 1

## 2011-09-28 MED ORDER — MULTIVITAMINS PO TABS: 1.0000 | ORAL_TABLET | Freq: Every day | ORAL | Status: DC

## 2011-09-28 MED ORDER — CITALOPRAM HYDROBROMIDE 10 MG PO TABS
10.0000 mg | ORAL_TABLET | Freq: Every day | ORAL | Status: DC
  Administered 2011-09-28 – 2011-09-29 (×2): 10 mg via ORAL
  Filled 2011-09-28 (×2): qty 1

## 2011-09-28 MED ORDER — IBUPROFEN 400 MG PO TABS
400.0000 mg | ORAL_TABLET | Freq: Four times a day (QID) | ORAL | Status: DC | PRN
  Filled 2011-09-28: qty 1

## 2011-09-28 MED ORDER — HYDROCODONE-ACETAMINOPHEN 5-325 MG PO TABS
1.0000 | ORAL_TABLET | ORAL | Status: DC | PRN
  Administered 2011-09-28 – 2011-09-29 (×4): 2 via ORAL
  Filled 2011-09-28 (×4): qty 2

## 2011-09-28 SURGICAL SUPPLY — 49 items
APPLIER CLIP 9.375 SM OPEN (CLIP)
ATTRACTOMAT 16X20 MAGNETIC DRP (DRAPES) IMPLANT
CANISTER SUCTION 2500CC (MISCELLANEOUS) ×2 IMPLANT
CLEANER TIP ELECTROSURG 2X2 (MISCELLANEOUS) ×2 IMPLANT
CLIP APPLIE 9.375 SM OPEN (CLIP) IMPLANT
CLOTH BEACON ORANGE TIMEOUT ST (SAFETY) ×2 IMPLANT
CONT SPEC 4OZ CLIKSEAL STRL BL (MISCELLANEOUS) ×2 IMPLANT
CORDS BIPOLAR (ELECTRODE) ×2 IMPLANT
COVER SURGICAL LIGHT HANDLE (MISCELLANEOUS) ×2 IMPLANT
DECANTER SPIKE VIAL GLASS SM (MISCELLANEOUS) ×2 IMPLANT
DERMABOND ADHESIVE PROPEN (GAUZE/BANDAGES/DRESSINGS) ×1
DERMABOND ADVANCED (GAUZE/BANDAGES/DRESSINGS)
DERMABOND ADVANCED .7 DNX12 (GAUZE/BANDAGES/DRESSINGS) IMPLANT
DERMABOND ADVANCED .7 DNX6 (GAUZE/BANDAGES/DRESSINGS) ×1 IMPLANT
DRAIN JACKSON RD 7FR 3/32 (WOUND CARE) IMPLANT
DRAIN SNY 10 ROU (WOUND CARE) ×2 IMPLANT
ELECT COATED BLADE 2.86 ST (ELECTRODE) ×2 IMPLANT
ELECT REM PT RETURN 9FT ADLT (ELECTROSURGICAL) ×2
ELECTRODE REM PT RTRN 9FT ADLT (ELECTROSURGICAL) ×1 IMPLANT
EVACUATOR SILICONE 100CC (DRAIN) ×2 IMPLANT
GAUZE SPONGE 4X4 16PLY XRAY LF (GAUZE/BANDAGES/DRESSINGS) ×2 IMPLANT
GLOVE BIO SURGEON STRL SZ 6.5 (GLOVE) ×2 IMPLANT
GLOVE BIOGEL PI IND STRL 6.5 (GLOVE) ×3 IMPLANT
GLOVE BIOGEL PI IND STRL 7.0 (GLOVE) ×1 IMPLANT
GLOVE BIOGEL PI INDICATOR 6.5 (GLOVE) ×3
GLOVE BIOGEL PI INDICATOR 7.0 (GLOVE) ×1
GLOVE ECLIPSE 7.5 STRL STRAW (GLOVE) ×2 IMPLANT
GLOVE SURG SS PI 6.5 STRL IVOR (GLOVE) ×8 IMPLANT
GOWN STRL NON-REIN LRG LVL3 (GOWN DISPOSABLE) ×6 IMPLANT
KIT BASIN OR (CUSTOM PROCEDURE TRAY) ×2 IMPLANT
KIT ROOM TURNOVER OR (KITS) ×2 IMPLANT
NEEDLE 27GAX1X1/2 (NEEDLE) ×2 IMPLANT
NS IRRIG 1000ML POUR BTL (IV SOLUTION) ×2 IMPLANT
PAD ARMBOARD 7.5X6 YLW CONV (MISCELLANEOUS) ×4 IMPLANT
PENCIL FOOT CONTROL (ELECTRODE) ×2 IMPLANT
SPECIMEN JAR MEDIUM (MISCELLANEOUS) IMPLANT
SPONGE INTESTINAL PEANUT (DISPOSABLE) IMPLANT
STAPLER VISISTAT 35W (STAPLE) ×2 IMPLANT
SUT CHROMIC 3 0 SH 27 (SUTURE) IMPLANT
SUT CHROMIC 4 0 PS 2 18 (SUTURE) ×4 IMPLANT
SUT ETHILON 3 0 PS 1 (SUTURE) IMPLANT
SUT ETHILON 5 0 P 3 18 (SUTURE) ×1
SUT NYLON ETHILON 5-0 P-3 1X18 (SUTURE) ×1 IMPLANT
SUT SILK 3 0 REEL (SUTURE) ×4 IMPLANT
SUT SILK 4 0 REEL (SUTURE) ×2 IMPLANT
TOWEL OR 17X24 6PK STRL BLUE (TOWEL DISPOSABLE) ×2 IMPLANT
TOWEL OR 17X26 10 PK STRL BLUE (TOWEL DISPOSABLE) ×2 IMPLANT
TRAY ENT MC OR (CUSTOM PROCEDURE TRAY) ×2 IMPLANT
WATER STERILE IRR 1000ML POUR (IV SOLUTION) ×2 IMPLANT

## 2011-09-28 NOTE — Anesthesia Postprocedure Evaluation (Signed)
Anesthesia Post Note  Patient: Felicia Solis  Procedure(s) Performed: Procedure(s) (LRB): THYROIDECTOMY (Right)  Anesthesia type: general  Patient location: PACU  Post pain: Pain level controlled  Post assessment: Patient's Cardiovascular Status Stable  Last Vitals:  Filed Vitals:   09/28/11 1000  BP: 127/58  Pulse:   Temp:   Resp:     Post vital signs: Reviewed and stable  Level of consciousness: sedated  Complications: No apparent anesthesia complications

## 2011-09-28 NOTE — Op Note (Signed)
OPERATIVE REPORT  DATE OF SURGERY: 09/28/2011  PATIENT:  Felicia Solis,  46 y.o. female  PRE-OPERATIVE DIAGNOSIS:  RIGHT THYROID MASS  POST-OPERATIVE DIAGNOSIS:  right thyroid mass  PROCEDURE:  Procedure(s): THYROIDECTOMY  SURGEON:  Susy Frizzle, MD  ASSISTANTS: Beckey Rutter, PA   ANESTHESIA:   general  EBL:  20 ml  DRAINS: (10 [place]) Jackson-Pratt drain(s) with closed bulb suction in the Neck   LOCAL MEDICATIONS USED:  None  SPECIMEN:  Source of Specimen:  Right thyroid lobe  COUNTS:  YES  PROCEDURE DETAILS: Patient was taken to the operating room and placed on the operating table in the supine position. Following induction of general endotracheal anesthesia, a shoulder roll was placed beneath the shoulder blades. The neck was extended. The neck was prepped and draped in a standard fashion. A low transverse incision was outlined with a marking pen in a cervical skin crease. Electrocautery was used to incise the skin and subcutaneous tissue and through the platysma layer. Subplatysmal flaps were elevated early the thyroid notch and inferiorly to the clavicle. A self-retaining thyroid retractor used throughout the case. The midline fascia was divided with electrocautery and the diastases of the strap muscles was opened. The strap muscles were reflected laterally on the right side exposing the right thyroid lobe which had multiple nodules in it. The thyroid lobe was reflected medially as the superior vasculature was identified, ligated between clamps and divided. The dissection stayed right on the capsule of the gland. The superior pole was brought down inferiorly the middle thyroid vein was identified, ligated and divided. 4-0 silk ties were used throughout the case. The inferior vasculature was treated in a similar fashion. The gland was brought forward off of the trachea. The recurrent nerve was identified and preserved. A suspected superior parathyroid was identified and  preserved with its blood supply. The ligament of Allyson Sabal was divided and the gland was brought off of the upper trachea. At this point of the dissection, there is an additional 2 nodules in the isthmus and it was difficult to tell on which side of these nodules the isthmus actually lied. Originally the lateral aspect was divided thinking that that was the isthmus but after that was resected, it was more evident that the isthmus was to the left of these nodules and then that was divided. 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 The left side was palpated and there were no separately identified masses found. The wound is irrigated with saline. Hemostasis was completed using bipolar cautery and additional silk ties as needed. The midline fascia was reapproximated using chromic suture. A drain was left in place, exited through the left-sided incision and secured in place with a nylon suture. The subcuticular layer was reapproximated with chromic suture and Dermabond was used on the skin. The patient was awakened, extubated and transferred to recovery in stable condition.   PATIENT DISPOSITION:  PACU - hemodynamically stable.

## 2011-09-28 NOTE — OR Nursing (Signed)
Frozen section sent at 0840.  Received by lab at 0844.

## 2011-09-28 NOTE — Anesthesia Procedure Notes (Signed)
Procedure Name: Intubation Date/Time: 09/28/2011 7:37 AM Performed by: Glendora Score A Pre-anesthesia Checklist: Patient identified, Emergency Drugs available, Suction available and Patient being monitored Patient Re-evaluated:Patient Re-evaluated prior to inductionOxygen Delivery Method: Circle system utilized Preoxygenation: Pre-oxygenation with 100% oxygen Intubation Type: IV induction Ventilation: Mask ventilation without difficulty Laryngoscope Size: Miller and 2 Grade View: Grade I Tube type: Oral Tube size: 7.0 mm Number of attempts: 1 Airway Equipment and Method: Stylet and LTA kit utilized Placement Confirmation: ETT inserted through vocal cords under direct vision,  positive ETCO2 and breath sounds checked- equal and bilateral Secured at: 22 cm Tube secured with: Tape Dental Injury: Teeth and Oropharynx as per pre-operative assessment

## 2011-09-28 NOTE — Preoperative (Signed)
Beta Blockers   Reason not to administer Beta Blockers:Not Applicable 

## 2011-09-28 NOTE — Transfer of Care (Signed)
Immediate Anesthesia Transfer of Care Note  Patient: Felicia Solis  Procedure(s) Performed: Procedure(s) (LRB): THYROIDECTOMY (Right)  Patient Location: PACU  Anesthesia Type: General  Level of Consciousness: awake, alert , oriented and patient cooperative  Airway & Oxygen Therapy: Patient Spontanous Breathing and Patient connected to face mask oxygen  Post-op Assessment: Report given to PACU RN  Post vital signs: Reviewed and stable  Complications: No apparent anesthesia complications

## 2011-09-28 NOTE — Anesthesia Preprocedure Evaluation (Signed)
Anesthesia Evaluation  Patient identified by MRN, date of birth, ID band Patient awake    Reviewed: Allergy & Precautions, H&P , NPO status , Patient's Chart, lab work & pertinent test results  Airway Mallampati: I      Dental  (+) Teeth Intact   Pulmonary asthma ,  No meds for asthma         Cardiovascular     Neuro/Psych  Headaches, Depression    GI/Hepatic   Endo/Other  Thyroid mass  Renal/GU      Musculoskeletal   Abdominal   Peds  Hematology   Anesthesia Other Findings   Reproductive/Obstetrics                           Anesthesia Physical Anesthesia Plan  ASA: II  Anesthesia Plan: General   Post-op Pain Management:    Induction: Intravenous  Airway Management Planned: Oral ETT  Additional Equipment:   Intra-op Plan:   Post-operative Plan:   Informed Consent: I have reviewed the patients History and Physical, chart, labs and discussed the procedure including the risks, benefits and alternatives for the proposed anesthesia with the patient or authorized representative who has indicated his/her understanding and acceptance.   Dental advisory given  Plan Discussed with: CRNA, Anesthesiologist and Surgeon  Anesthesia Plan Comments:         Anesthesia Quick Evaluation

## 2011-09-28 NOTE — Progress Notes (Signed)
Patient ID: Felicia Solis, female   DOB: May 01, 1966, 46 y.o.   MRN: [id number]  Awake and alert, no complaints. Swallowing and speaking normally.  Suture line excellent, no swelling.   Stable post op.  Overnight observation. Remove JP and d/c home in am.

## 2011-09-28 NOTE — Discharge Instructions (Signed)
You may use soap and water on your neck but did not use any cream or ointment.

## 2011-09-28 NOTE — Interval H&P Note (Signed)
History and Physical Interval Note:  09/28/2011 7:17 AM  Felicia Solis  has presented today for surgery, with the diagnosis of RIGHT THYROID MASS  The various methods of treatment have been discussed with the patient and family. After consideration of risks, benefits and other options for treatment, the patient has consented to  Procedure(s) (LRB): THYROIDECTOMY (Right) as a surgical intervention .  The patients' history has been reviewed, patient examined, no change in status, stable for surgery.  I have reviewed the patients' chart and labs.  Questions were answered to the patient's satisfaction.     Elane Peabody

## 2011-09-29 ENCOUNTER — Encounter (HOSPITAL_COMMUNITY): Payer: Self-pay | Admitting: *Deleted

## 2011-09-29 NOTE — Plan of Care (Signed)
Problem: Consults Goal: Thyroidectomy/Parathyroid Patient Education (See Patient Education module for education specifics.) Outcome: Completed/Met Date Met:  09/29/11 Prior to surgery per pt.  Problem: Discharge Progression Outcomes Goal: Thyroid hormone replacement Outcome: Not Applicable Date Met:  09/29/11 MD to order 1 week postop per pt.

## 2011-09-29 NOTE — Progress Notes (Signed)
D/C per w/c to home in stable consition w/ scripts.

## 2011-09-29 NOTE — Discharge Summary (Signed)
  Physician Discharge Summary  Patient ID: Felicia Solis MRN: [id number] DOB/AGE: 1965/09/10 46 y.o.  Admit date: 09/28/2011 Discharge date: 09/29/2011  Admission Diagnoses:thyroid mass  Discharge Diagnoses:  Active Problems:  * No active hospital problems. *    Discharged Condition: good  Hospital Course: no complications  Consults: None  Significant Diagnostic Studies: none  Treatments: surgery: thyroid lobectomy  Discharge Exam: Blood pressure 111/66, pulse 69, temperature 99.1 F (37.3 C), temperature source Oral, resp. rate 18, height 5' 6.5" (1.689 m), weight 238 lb 1.6 oz (108 kg), SpO2 98.00%. PHYSICAL EXAM: Neck and voice excellent, JP removed.  Disposition: 01-Home or Self Care  Discharge Orders    Future Appointments: Provider: Department: Dept Phone: Center:   10/08/2011 9:00 AM [facility] U/S 1 [facility] [phone] None   10/08/2011 9:15 AM Naima A Dillard, MD [facility] [phone] None   10/08/2011 10:00 AM [facility] Procedure Rm [facility] [phone] None     Future Orders Please Complete By Expires   Diet - low sodium heart healthy      Diet - low sodium heart healthy      Increase activity slowly      Increase activity slowly        Medication List  As of 09/29/2011  7:50 AM   TAKE these medications         acetaminophen 500 MG tablet   Commonly known as: TYLENOL   Take 1,000 mg by mouth every 6 (six) hours as needed.      citalopram 10 MG tablet   Commonly known as: CELEXA   Take 1 tablet (10 mg total) by mouth daily.      HYDROcodone-acetaminophen 7.5-500 MG per tablet   Commonly known as: LORTAB   Take 1 tablet by mouth every 6 (six) hours as needed for pain.      ibuprofen 200 MG tablet   Commonly known as: ADVIL,MOTRIN   Take 400 mg by mouth every 6 (six) hours as needed. For pain      multivitamin per tablet   Take 1 tablet by mouth daily.      promethazine 25 MG suppository   Commonly known as: PHENERGAN   Place 1 suppository (25 mg  total) rectally every 6 (six) hours as needed for nausea.           Follow-up Information    Follow up with [name], MD. Call in 1 week.   Contact information:   [address], [address], [address] [place] [address] [phone]          Signed: [name] 09/29/2011, 7:50 AM

## 2011-09-29 NOTE — Progress Notes (Signed)
No thyroid med ordered on D/C per DR Pollyann Kennedy.

## 2011-10-01 ENCOUNTER — Encounter (HOSPITAL_COMMUNITY): Payer: Self-pay | Admitting: Otolaryngology

## 2011-10-08 ENCOUNTER — Other Ambulatory Visit: Payer: Self-pay | Admitting: Obstetrics and Gynecology

## 2011-10-08 ENCOUNTER — Other Ambulatory Visit: Payer: Self-pay

## 2011-10-08 ENCOUNTER — Encounter (INDEPENDENT_AMBULATORY_CARE_PROVIDER_SITE_OTHER): Payer: BC Managed Care – PPO

## 2011-10-08 ENCOUNTER — Ambulatory Visit (INDEPENDENT_AMBULATORY_CARE_PROVIDER_SITE_OTHER): Payer: Self-pay | Admitting: Obstetrics and Gynecology

## 2011-10-08 VITALS — BP 118/76 | Ht 66.0 in | Wt 240.0 lb

## 2011-10-08 DIAGNOSIS — N926 Irregular menstruation, unspecified: Secondary | ICD-10-CM

## 2011-10-08 DIAGNOSIS — IMO0001 Reserved for inherently not codable concepts without codable children: Secondary | ICD-10-CM

## 2011-10-08 DIAGNOSIS — R87610 Atypical squamous cells of undetermined significance on cytologic smear of cervix (ASC-US): Secondary | ICD-10-CM

## 2011-10-08 DIAGNOSIS — N920 Excessive and frequent menstruation with regular cycle: Secondary | ICD-10-CM

## 2011-10-08 NOTE — Progress Notes (Signed)
sonohystogram :  Pt placed in dorsal lithotmy position.  Cervix prepped with hibiclens.  Endometrial biopsy done per protocol.  Cervix sounded to 7 cm.   Catheter placed in the cervix and the uterus filled with saline.  The uterus did not fill well.  This may be because of her history of ablation.   Uterus also significant for three fibroids all less than 3 cm in size.  Adnexa are normal.   Fibroids:  Pt just desires observation One episode of heavy vaginal bleeding:  Embx done per protocol pt desires observation ASCUS HRHPV negative 3/13  Repeat pap 08/2012

## 2011-10-08 NOTE — Progress Notes (Signed)
Addended by: Rolla Plate on: 10/08/2011 11:31 AM   Modules accepted: Orders

## 2011-10-10 LAB — PATHOLOGY

## 2011-10-22 ENCOUNTER — Encounter: Payer: Self-pay | Admitting: Obstetrics and Gynecology

## 2011-10-22 ENCOUNTER — Telehealth: Payer: Self-pay | Admitting: Family Medicine

## 2011-10-22 NOTE — Telephone Encounter (Signed)
Office Message [address] [place], [place] [address] p. [phone] f. [phone] To: [facility] (Daytime Triage) Fax: [phone] From: [facility] Date/ Time: 10/22/2011 1:04 PM Taken By: Freddie Breech, RN Caller: Burna Mortimer Facility: Not Collected Patient: Felicia Solis, Felicia Solis DOB: Aug 15, 1965 Phone: [phone] Reason for Call: Caller was unable to be reached on callback - Left Message Regarding Appointment: No Appt Date: Appt Time: Unknown Provider: Reason: Details: Outcome:

## 2011-10-22 NOTE — Telephone Encounter (Signed)
Left Pt detail message with Dr Beverely Low comments.

## 2011-10-22 NOTE — Telephone Encounter (Signed)
Noted.  Please advise pt that if sxs worsen or change she will need ER evaluation

## 2011-10-22 NOTE — Telephone Encounter (Signed)
Caller: Felicia Solis/Patient; PCP: Lelon Perla.; CB#: [phone]; ; ; Call regarding Chest Pain/Chest Discomfort; onset of chest pain 10/19/11.  Took ibuprofen for back pain noted 10/21/11 R lower back.  Currently having some discomfort in middle of back.  c/o mid to R chest pain at times which is not associated with SOB.  Had similar chest pain prior to thyroid surgery 09/28/11, and ECG and xrays were normal.  Per protocol, emergent symptoms denied; advised appt within 24 hours.  Appt sched in office 10/23/11 0830 with Dr. Beverely Low.

## 2011-10-23 ENCOUNTER — Ambulatory Visit (INDEPENDENT_AMBULATORY_CARE_PROVIDER_SITE_OTHER): Payer: BC Managed Care – PPO | Admitting: Family Medicine

## 2011-10-23 ENCOUNTER — Encounter: Payer: Self-pay | Admitting: Family Medicine

## 2011-10-23 DIAGNOSIS — R079 Chest pain, unspecified: Secondary | ICD-10-CM

## 2011-10-23 DIAGNOSIS — N644 Mastodynia: Secondary | ICD-10-CM | POA: Insufficient documentation

## 2011-10-23 LAB — CK TOTAL AND CKMB (NOT AT ARMC)
Relative Index: 0.7 (ref 0.0–2.5)
Total CK: 153 U/L (ref 7–177)

## 2011-10-23 NOTE — Progress Notes (Signed)
  Subjective:    Patient ID: Felicia Solis, female    DOB: 09/13/1965, 46 y.o.   MRN: [id number]  HPI Chest pain- sxs started Saturday morning on L side, described as a sharp, shooting pain.  Occuring more regularly.  Has hx of similar w/ normal workup.  No SOB, nausea, diaphoresis.  Having some low back pain, mostly on L side.  Yesterday pain started in L chest and radiated across to R side.  Pains are instantaneous.  No hx of GERD.  Not currently having pain.  Denies heavy lifting.   Review of Systems For ROS see HPI     Objective:   Physical Exam  Vitals reviewed. Constitutional: She appears well-developed and well-nourished. No distress.  Neck: Normal range of motion. Neck supple.  Cardiovascular: Normal rate, regular rhythm, normal heart sounds and intact distal pulses.   No murmur heard. Pulmonary/Chest: Effort normal and breath sounds normal. No respiratory distress. She has no wheezes. She has no rales. She exhibits no tenderness.  Lymphadenopathy:    She has no cervical adenopathy.          Assessment & Plan:

## 2011-10-23 NOTE — Assessment & Plan Note (Signed)
New.  Pt reports this is not 1st episode.  ekg done- unchanged from earlier this month.  Pt's sxs are not typical of cardiac chest pain- atypical.  Given pt's large breasts and unsupportive bra, wonder whether she is having suspensory ligament pain.  Check CE's to r/o cardiac cause.  Encouraged pt to switch bras and see if symptoms improve.  Will follow.

## 2011-10-23 NOTE — Patient Instructions (Signed)
We'll notify you of your lab results The EKG looks the same as it did the beginning of the month- this is good news Try and wear a tight, supportive bra to prevent pulling on the chest wall Call with any questions or concerns Hang in there!

## 2011-10-29 ENCOUNTER — Telehealth: Payer: Self-pay

## 2011-10-29 ENCOUNTER — Telehealth: Payer: Self-pay | Admitting: *Deleted

## 2011-10-29 MED ORDER — SERTRALINE HCL 50 MG PO TABS: ORAL_TABLET | ORAL | Status: DC

## 2011-10-29 NOTE — Telephone Encounter (Signed)
Lm on vm tcb rgd labs 

## 2011-10-29 NOTE — Telephone Encounter (Signed)
Message copied by Rolla Plate on Mon Oct 29, 2011 11:41 AM ------      Message from: Jaymes Graff      Created: Wed Oct 24, 2011  9:27 PM       i recommend patient come back for a repeat EMBX      ----- Message -----         From: [facility]         Sent: 10/10/2011   5:19 PM           To: Michael Litter, MD

## 2011-10-29 NOTE — Telephone Encounter (Signed)
zoloft 50 mg  #30  1/2 tab qd for 8 days then increase to 1 tab po qd

## 2011-10-29 NOTE — Telephone Encounter (Signed)
Spoke with the patient and she said the Celexa is causing her to have Chest pain, she was seen by Dr.Tabori and after the visit she stopped the Celexa and has not had any chest pain. Please advise     Kp

## 2011-10-29 NOTE — Telephone Encounter (Signed)
Pt would like to know if it would be possible to change citalopram (CELEXA) 10 MG to another med. Please advise

## 2011-10-29 NOTE — Telephone Encounter (Signed)
VM left advising patient the Rx has been sent.     KP

## 2011-10-29 NOTE — Telephone Encounter (Signed)
I need to know why-- so we know what to choose.

## 2011-10-31 ENCOUNTER — Telehealth: Payer: Self-pay

## 2011-10-31 NOTE — Telephone Encounter (Signed)
Spoke with pt rgd labs informed endo biopsy results and ND suggest we do a repeat endo bx pt has appt 11/09/11 at  8:30 pt voice understanding

## 2011-11-09 ENCOUNTER — Encounter: Payer: BC Managed Care – PPO | Admitting: Obstetrics and Gynecology

## 2012-03-26 ENCOUNTER — Encounter: Payer: Self-pay | Admitting: Family Medicine

## 2012-03-26 ENCOUNTER — Ambulatory Visit (INDEPENDENT_AMBULATORY_CARE_PROVIDER_SITE_OTHER): Payer: Self-pay | Admitting: Family Medicine

## 2012-03-26 VITALS — BP 110/64 | HR 75 | Temp 98.1°F | Ht 66.0 in | Wt 242.8 lb

## 2012-03-26 DIAGNOSIS — R079 Chest pain, unspecified: Secondary | ICD-10-CM

## 2012-03-26 DIAGNOSIS — M549 Dorsalgia, unspecified: Secondary | ICD-10-CM

## 2012-03-26 DIAGNOSIS — Z8249 Family history of ischemic heart disease and other diseases of the circulatory system: Secondary | ICD-10-CM

## 2012-03-26 MED ORDER — CYCLOBENZAPRINE HCL 10 MG PO TABS: 10.0000 mg | ORAL_TABLET | Freq: Three times a day (TID) | ORAL | Status: DC | PRN

## 2012-03-26 MED ORDER — MELOXICAM 15 MG PO TABS: ORAL_TABLET | ORAL | Status: DC

## 2012-03-26 NOTE — Assessment & Plan Note (Signed)
ekg normal Maybe radiating from back If pain returns go to ER.

## 2012-03-26 NOTE — Progress Notes (Signed)
  Subjective:    Felicia Solis is a 46 y.o. female who presents for evaluation of low back pain. The patient has had no prior back problems. Symptoms have been present for 6 days and are gradually worsening.  Onset was related to / precipitated by lifting a heavy object. The pain is located in the interscapular area or across the lower back and radiates down left leg. The pain is described as aching and stabbing and occurs all day. She rates her pain as a 10 on a scale of 0-10. Symptoms are exacerbated by lifting. Symptoms are improved by nothing. She has also tried NSAIDs which provided no symptom relief. She has tingling in left arm associated with the back pain. The patient has no "red flag" history indicative of complicated back pain. However pt then said one day she had chest pain that radiated down L arm.  + sob ---over weekend.      The following portions of the patient's history were reviewed and updated as appropriate: allergies, current medications, past family history, past medical history, past social history, past surgical history and problem list.  Review of Systems Pertinent items are noted in HPI.    Objective:   Full range of motion without pain, no tenderness, no spasm, no curvature. Normal reflexes, gait, strength and negative straight-leg raise. Inspection and palpation: inspection of back is normal, paraspinal tenderness noted t4-7 . Muscle tone and ROM exam: muscle spasm noted paraspinally t4-7. Straight leg raise: negative at 90 degrees bilaterally. Neurological: normal DTRs, muscle strength and reflexes. cor  s1s2    Assessment:    Nonspecific acute low back pain    Plan:    Natural history and expected course discussed. Questions answered. Regular aerobic and trunk strengthening exercises discussed. Short (2-4 day) period of relative rest recommended until acute symptoms improve. Ice to affected area as needed for local pain relief. Heat to affected area as  needed for local pain relief. Muscle relaxants per medication orders. Follow-up in 2 weeks. --or sooner prn

## 2012-03-26 NOTE — Addendum Note (Signed)
Addended by: Lelon Perla on: 03/26/2012 11:40 AM   Modules accepted: Orders, Level of Service

## 2012-03-26 NOTE — Progress Notes (Signed)
  Subjective:    Patient ID: Felicia Solis, female    DOB: 24-Nov-1965, 46 y.o.   MRN: [id number]  HPI    Review of Systems     Objective:   Physical Exam        Assessment & Plan:

## 2012-03-26 NOTE — Patient Instructions (Addendum)
Back Pain, Adult Low back pain is very common. About 1 in 5 people have back pain.The cause of low back pain is rarely dangerous. The pain often gets better over time.About half of people with a sudden onset of back pain feel better in just 2 weeks. About 8 in 10 people feel better by 6 weeks.  CAUSES Some common causes of back pain include:  Strain of the muscles or ligaments supporting the spine.  Wear and tear (degeneration) of the spinal discs.  Arthritis.  Direct injury to the back. DIAGNOSIS Most of the time, the direct cause of low back pain is not known.However, back pain can be treated effectively even when the exact cause of the pain is unknown.Answering your caregiver's questions about your overall health and symptoms is one of the most accurate ways to make sure the cause of your pain is not dangerous. If your caregiver needs more information, he or she may order lab work or imaging tests (X-rays or MRIs).However, even if imaging tests show changes in your back, this usually does not require surgery. HOME CARE INSTRUCTIONS For many people, back pain returns.Since low back pain is rarely dangerous, it is often a condition that people can learn to manageon their own.   Remain active. It is stressful on the back to sit or stand in one place. Do not sit, drive, or stand in one place for more than 30 minutes at a time. Take short walks on level surfaces as soon as pain allows.Try to increase the length of time you walk each day.  Do not stay in bed.Resting more than 1 or 2 days can delay your recovery.  Do not avoid exercise or work.Your body is made to move.It is not dangerous to be active, even though your back may hurt.Your back will likely heal faster if you return to being active before your pain is gone.  Pay attention to your body when you bend and lift. Many people have less discomfortwhen lifting if they bend their knees, keep the load close to their bodies,and  avoid twisting. Often, the most comfortable positions are those that put less stress on your recovering back.  Find a comfortable position to sleep. Use a firm mattress and lie on your side with your knees slightly bent. If you lie on your back, put a pillow under your knees.  Only take over-the-counter or prescription medicines as directed by your caregiver. Over-the-counter medicines to reduce pain and inflammation are often the most helpful.Your caregiver may prescribe muscle relaxant drugs.These medicines help dull your pain so you can more quickly return to your normal activities and healthy exercise.  Put ice on the injured area.  Put ice in a plastic bag.  Place a towel between your skin and the bag.  Leave the ice on for 15 to 20 minutes, 3 to 4 times a day for the first 2 to 3 days. After that, ice and heat may be alternated to reduce pain and spasms.  Ask your caregiver about trying back exercises and gentle massage. This may be of some benefit.  Avoid feeling anxious or stressed.Stress increases muscle tension and can worsen back pain.It is important to recognize when you are anxious or stressed and learn ways to manage it.Exercise is a great option. SEEK MEDICAL CARE IF:  You have pain that is not relieved with rest or medicine.  You have pain that does not improve in 1 week.  You have new symptoms.  You are generally   not feeling well. SEEK IMMEDIATE MEDICAL CARE IF:   You have pain that radiates from your back into your legs.  You develop new bowel or bladder control problems.  You have unusual weakness or numbness in your arms or legs.  You develop nausea or vomiting.  You develop abdominal pain.  You feel faint. Document Released: 06/11/2005 Document Revised: 12/11/2011 Document Reviewed: 10/30/2010 [employer] Patient Information 2013 [employer].  

## 2012-04-21 ENCOUNTER — Encounter: Payer: Self-pay | Admitting: Internal Medicine

## 2012-04-21 ENCOUNTER — Ambulatory Visit (HOSPITAL_COMMUNITY): Payer: Self-pay | Attending: Cardiology

## 2012-04-21 DIAGNOSIS — R42 Dizziness and giddiness: Secondary | ICD-10-CM | POA: Insufficient documentation

## 2012-04-21 DIAGNOSIS — R0989 Other specified symptoms and signs involving the circulatory and respiratory systems: Secondary | ICD-10-CM | POA: Insufficient documentation

## 2012-04-21 DIAGNOSIS — R0609 Other forms of dyspnea: Secondary | ICD-10-CM | POA: Insufficient documentation

## 2012-04-21 DIAGNOSIS — R072 Precordial pain: Secondary | ICD-10-CM | POA: Insufficient documentation

## 2012-04-21 DIAGNOSIS — R079 Chest pain, unspecified: Secondary | ICD-10-CM

## 2012-04-21 DIAGNOSIS — I447 Left bundle-branch block, unspecified: Secondary | ICD-10-CM | POA: Insufficient documentation

## 2012-04-21 DIAGNOSIS — Z8249 Family history of ischemic heart disease and other diseases of the circulatory system: Secondary | ICD-10-CM | POA: Insufficient documentation

## 2012-04-21 NOTE — Progress Notes (Signed)
Echocardiogram performed.  

## 2012-04-21 NOTE — Progress Notes (Signed)
   Patient ID: Felicia Solis, female    DOB: Mar 20, 1966, 46 y.o.   MRN: [id number]  HPI    Review of Systems    Physical Exam

## 2012-04-30 ENCOUNTER — Ambulatory Visit: Payer: Self-pay | Admitting: Cardiology

## 2012-05-01 ENCOUNTER — Ambulatory Visit (INDEPENDENT_AMBULATORY_CARE_PROVIDER_SITE_OTHER): Payer: Self-pay | Admitting: Family Medicine

## 2012-05-01 ENCOUNTER — Telehealth: Payer: Self-pay | Admitting: Family Medicine

## 2012-05-01 ENCOUNTER — Encounter: Payer: Self-pay | Admitting: Family Medicine

## 2012-05-01 VITALS — BP 126/80 | HR 74 | Temp 98.2°F | Resp 16 | Wt 243.4 lb

## 2012-05-01 DIAGNOSIS — R079 Chest pain, unspecified: Secondary | ICD-10-CM

## 2012-05-01 LAB — CBC WITH DIFFERENTIAL/PLATELET
Eosinophils Absolute: 0.1 10*3/uL (ref 0.0–0.7)
MCHC: 32.1 g/dL (ref 30.0–36.0)
MCV: 86.9 fl (ref 78.0–100.0)
Monocytes Absolute: 0.5 10*3/uL (ref 0.1–1.0)
Neutrophils Relative %: 49.6 % (ref 43.0–77.0)
Platelets: 280 10*3/uL (ref 150.0–400.0)

## 2012-05-01 LAB — BASIC METABOLIC PANEL
BUN: 10 mg/dL (ref 6–23)
GFR: 140.18 mL/min (ref >60.00)
Potassium: 3.7 mEq/L (ref 3.5–5.1)
Sodium: 137 mEq/L (ref 135–145)

## 2012-05-01 MED ORDER — OMEPRAZOLE 40 MG PO CPDR: 40.0000 mg | DELAYED_RELEASE_CAPSULE | Freq: Every day | ORAL | Status: DC

## 2012-05-01 NOTE — Patient Instructions (Addendum)
Follow up w/ Dr Laury Axon in 1 month to recheck anxiety Keep your appt w/ cardiology Start the Dexilant daily- once the samples run out, start the Omeprazole (script at pharmacy) [facility] notify you of your lab results Your Cardiology studies look good! Hang in there!!

## 2012-05-01 NOTE — Progress Notes (Signed)
  Subjective:    Patient ID: Felicia Solis, female    DOB: 06/10/1966, 46 y.o.   MRN: [id number]  HPI Chest pain- occuring just L of center, occuring 3-5x/day.  Pain is short lived, 'it just shoots through you'.  Pain is sharp.  Will have some associated SOB after pain- pt feels this may be anxiety.  No nausea.  Pain will occur in chest and on top of L shoulder.  No pattern to pain- no relation to food.  2 nights last week pain awoke her from sleep.  Had recent normal stress ECHO.  Not currently on tx for GERD.   Review of Systems For ROS see HPI     Objective:   Physical Exam  Vitals reviewed. Constitutional: She is oriented to person, place, and time. She appears well-developed and well-nourished. No distress.  HENT:  Head: Normocephalic and atraumatic.  Eyes: Conjunctivae normal and EOM are normal. Pupils are equal, round, and reactive to light.  Neck: Normal range of motion. Neck supple. No thyromegaly present.  Cardiovascular: Normal rate, regular rhythm, normal heart sounds and intact distal pulses.   No murmur heard. Pulmonary/Chest: Effort normal and breath sounds normal. No respiratory distress.  Abdominal: Soft. She exhibits no distension. There is no tenderness.  Musculoskeletal: She exhibits no edema.  Lymphadenopathy:    She has no cervical adenopathy.  Neurological: She is alert and oriented to person, place, and time.  Skin: Skin is warm and dry.  Psychiatric: She has a normal mood and affect. Her behavior is normal.          Assessment & Plan:

## 2012-05-01 NOTE — Telephone Encounter (Signed)
To Md for review      KP 

## 2012-05-01 NOTE — Telephone Encounter (Signed)
Caller: Shanae/Patient; Patient Name: Felicia Solis; PCP: Lelon Perla.; Best Callback Phone Number: [phone]; Reason for call: Chest Pain/Chest Discomfort THE PATIENT REFUSED 911  Patient is at work currently; Saw Dr. Laury Axon for chest and back pain in past; Stress Echo looked ok, but needed to see a Cardiologist on 05/14/12 for possible blockage.  Patient is starting to have more pain in leg and chest pain is getting to be more regular.  Sharp pain that randomly shoots through chest-but this is what it has been but is more often.  Rates pain as 10/10.  Is having it about 3-4 times a day and it doesn't matter what she is doing to cause it.  It does happen at night and wakes patient.  Is having shortness of breath (has history of asthma); Thinks the shortness of breath occurs after she has the pain--lasting 5- 10 minutes.  She is not sure if its anxiety or not.  Currently 05/01/12 states she feels ok.  Had one episode of chest pain and shortness of breath after lifting of patient less than 1 hour ago.  Triaged using Chest pain with a 911 disposition due to pain with shortness of breath now or within las hour that occurs with rest and not relieved after being in a sitting or standing position.  Patient refused and was actually driving at this time. RN requested patient to pull over and call or if insisted to drive to ER.  Patient states that actually she had a call from her mother this morning about all the things she should and should not be doing so she is just unsure...RN asked if patient would at least come into office and she stated she would.  RN encouraged patient to talk about anxiety as well as symptoms.  Also reinforced to call 911 or go to ED if symptoms occur or worsen.  Caller demonstrated understanding.

## 2012-05-01 NOTE — Telephone Encounter (Signed)
Pt here

## 2012-05-02 ENCOUNTER — Encounter: Payer: Self-pay | Admitting: Family Medicine

## 2012-05-02 NOTE — Assessment & Plan Note (Signed)
New to provider, ongoing for pt.  Very atypical for cardiac.  Normal stress ECHO.  Normal EKG.  Check labs to r/o anemia, thyroid abnormality or metabolic disturbance.  Pt has upcoming appt w/ cards.  Encouraged her to keep this appt.  Start PPI for possible GERD.  Reviewed supportive care and red flags that should prompt return.  Pt expressed understanding and is in agreement w/ plan.

## 2012-05-14 ENCOUNTER — Ambulatory Visit (INDEPENDENT_AMBULATORY_CARE_PROVIDER_SITE_OTHER): Payer: Self-pay | Admitting: Cardiovascular Disease

## 2012-05-14 VITALS — BP 120/86 | HR 76 | Ht 66.0 in | Wt 246.4 lb

## 2012-05-14 DIAGNOSIS — R9431 Abnormal electrocardiogram [ECG] [EKG]: Secondary | ICD-10-CM | POA: Insufficient documentation

## 2012-05-14 DIAGNOSIS — R079 Chest pain, unspecified: Secondary | ICD-10-CM

## 2012-05-14 NOTE — Patient Instructions (Addendum)
Your physician recommends that you schedule a follow-up appointment in: AS NEEDED  Your physician recommends that you continue on your current medications as directed. Please refer to the Current Medication list given to you today.  

## 2012-05-14 NOTE — Progress Notes (Signed)
Patient ID: LAIYAH KROLIKOWSKI, female   DOB: 13-Feb-1966, 46 y.o.   MRN: [id number] Chest pain- occuring just L of center, occuring 3-5x/day. Pain is short lived, 'it just shoots through you'. Pain is sharp. Will have some associated SOB after pain- pt feels this may be anxiety. No nausea. Pain will occur in chest and on top of L shoulder. No pattern to pain- no relation to food. 2 nights last week pain awoke her from sleep. Had recent normal stress ECHO. Not currently on tx for GERD.Pain is relieved some with NSAI.  She had an IVCD with stress at high HR but no RWMAls    ROS: Denies fever, malais, weight loss, blurry vision, decreased visual acuity, cough, sputum, SOB, hemoptysis, pleuritic pain, palpitaitons, heartburn, abdominal pain, melena, lower extremity edema, claudication, or rash.  All other systems reviewed and negative   General: Affect appropriate Healthy:  appears stated age HEENT: normal Neck supple with no adenopathy JVP normal no bruits no thyromegaly Lungs clear with no wheezing and good diaphragmatic motion Heart:  S1/S2 no murmur,rub, gallop or click PMI normal Abdomen: benighn, BS positve, no tenderness, no AAA no bruit.  No HSM or HJR Distal pulses intact with no bruits No edema Neuro non-focal Skin warm and dry No muscular weakness  Medications Current Outpatient Prescriptions  Medication Sig Dispense Refill  . cyclobenzaprine (FLEXERIL) 10 MG tablet Take 1 tablet (10 mg total) by mouth 3 (three) times daily as needed for muscle spasms.  30 tablet  0  . ibuprofen (ADVIL,MOTRIN) 200 MG tablet Take 400 mg by mouth every 6 (six) hours as needed. For pain      . meloxicam (MOBIC) 15 MG tablet 1/2 - 1 po qd prn pain  30 tablet  2  . omeprazole (PRILOSEC) 40 MG capsule Take 1 capsule (40 mg total) by mouth daily.  30 capsule  3    Allergies Review of patient's allergies indicates no known allergies.  Family History: Family History  Problem Relation Age of Onset  .  Diabetes Mother   . Hypertension Mother   . Hyperlipidemia Mother   . Diabetes Sister   . Hypertension Father   . Hyperlipidemia Father   . Ovarian cancer Maternal Grandmother   . Colon cancer Paternal Grandfather   . Breast cancer Maternal Aunt   . Anesthesia problems Neg Hx   . Hypotension Neg Hx   . Malignant hyperthermia Neg Hx   . Pseudochol deficiency Neg Hx   . Heart disease Other     early cad    Social History: History   Social History  . Marital Status: Married    Spouse Name: N/A    Number of Children: N/A  . Years of Education: N/A   Occupational History  . [employer]    Social History Main Topics  . Smoking status: Never Smoker   . Smokeless tobacco: Never Used  . Alcohol Use: No  . Drug Use: No  . Sexually Active: Yes -- Female partner(s)   Other Topics Concern  . Not on file   Social History Narrative  . No narrative on file    Electrocardiogram:  11/7 NSR rate 70 normal   Assessment and Plan

## 2012-05-14 NOTE — Assessment & Plan Note (Addendum)
Atypical pain with normal stress echo.  No need for further w/u Also had normal CXR 4/13 reviewed

## 2012-05-14 NOTE — Assessment & Plan Note (Signed)
IVCD with high HR during ETT Not clinically relevant at this time

## 2012-07-01 ENCOUNTER — Ambulatory Visit (INDEPENDENT_AMBULATORY_CARE_PROVIDER_SITE_OTHER): Payer: Self-pay | Admitting: Family Medicine

## 2012-07-01 ENCOUNTER — Encounter: Payer: Self-pay | Admitting: Family Medicine

## 2012-07-01 VITALS — BP 116/68 | HR 88 | Temp 98.5°F | Wt 244.4 lb

## 2012-07-01 DIAGNOSIS — R1013 Epigastric pain: Secondary | ICD-10-CM

## 2012-07-01 MED ORDER — OMEPRAZOLE 40 MG PO CPDR: 40.0000 mg | DELAYED_RELEASE_CAPSULE | Freq: Every day | ORAL | Status: DC

## 2012-07-01 NOTE — Patient Instructions (Signed)
Diet for Gastroesophageal Reflux Disease, Adult  Reflux (acid reflux) is when acid from your stomach flows up into the esophagus. When acid comes in contact with the esophagus, the acid causes irritation and soreness (inflammation) in the esophagus. When reflux happens often or so severely that it causes damage to the esophagus, it is called gastroesophageal reflux disease (GERD). Nutrition therapy can help ease the discomfort of GERD.  FOODS OR DRINKS TO AVOID OR LIMIT   Smoking or chewing tobacco. Nicotine is one of the most potent stimulants to acid production in the gastrointestinal tract.   Caffeinated and decaffeinated coffee and black tea.   Regular or low-calorie carbonated beverages or energy drinks (caffeine-free carbonated beverages are allowed).    Strong spices, such as black pepper, white pepper, red pepper, cayenne, curry powder, and chili powder.   Peppermint or spearmint.   Chocolate.   High-fat foods, including meats and fried foods. Extra added fats including oils, butter, salad dressings, and nuts. Limit these to less than 8 tsp per day.   Fruits and vegetables if they are not tolerated, such as citrus fruits or tomatoes.   Alcohol.   Any food that seems to aggravate your condition.  If you have questions regarding your diet, call your caregiver or a registered dietitian.  OTHER THINGS THAT MAY HELP GERD INCLUDE:    Eating your meals slowly, in a relaxed setting.   Eating 5 to 6 small meals per day instead of 3 large meals.   Eliminating food for a period of time if it causes distress.   Not lying down until 3 hours after eating a meal.   Keeping the head of your bed raised 6 to 9 inches (15 to 23 cm) by using a foam wedge or blocks under the legs of the bed. Lying flat may make symptoms worse.   Being physically active. Weight loss may be helpful in reducing reflux in overweight or obese adults.   Wear loose fitting clothing  EXAMPLE MEAL PLAN  This meal plan is approximately  2,000 calories based on [employer]My[employer].gov meal planning guidelines.  Breakfast    cup cooked oatmeal.   1 cup strawberries.   1 cup low-fat milk.   1 oz almonds.  Snack   1 cup cucumber slices.   6 oz yogurt (made from low-fat or fat-free milk).  Lunch   2 slice whole-wheat bread.   2 oz sliced turkey.   2 tsp mayonnaise.   1 cup blueberries.   1 cup snap peas.  Snack   6 whole-wheat crackers.   1 oz string cheese.  Dinner    cup brown rice.   1 cup mixed veggies.   1 tsp olive oil.   3 oz grilled fish.  Document Released: 06/11/2005 Document Revised: 09/03/2011 Document Reviewed: 04/27/2011  [employer] Patient Information 2013 [employer].

## 2012-07-01 NOTE — Progress Notes (Signed)
  Subjective:     Felicia Solis is a 47 y.o. female who presents for evaluation of abdominal pain. Onset was several days ago. Symptoms have been gradually worsening. The pain is described as burning, and is 5/10 in intensity. Pain is located in the epigastric region without radiation.  Aggravating factors: eating.  Alleviating factors: none. Associated symptoms: belching and flatus. The patient denies anorexia, arthralagias, chills, constipation, diarrhea, dysuria, fever, frequency, headache, hematochezia, hematuria, melena, myalgias, nausea, sweats and vomiting.  The patient's history has been marked as reviewed and updated as appropriate.  Review of Systems Pertinent items are noted in HPI.     Objective:    BP 116/68  Pulse 88  Temp 98.5 F (36.9 C) (Oral)  Wt 244 lb 6.4 oz (110.859 kg)  SpO2 99% General appearance: alert, cooperative, appears stated age and no distress Abdomen: abnormal findings:  mild tenderness in the epigastrium rectal-- heme neg brown stool, no ext hemorrhoids    Assessment:    Abdominal pain, likely secondary to reflux .    Plan:    The diagnosis was discussed with the patient and evaluation and treatment plans outlined. See orders for lab and imaging studies. Initiate empiric trial of acid suppression; see orders. Further follow-up plans will be based on outcome of lab/imaging studies; see orders.  Check labs

## 2012-07-02 LAB — CBC WITH DIFFERENTIAL/PLATELET
Basophils Absolute: 0 10*3/uL (ref 0.0–0.1)
Eosinophils Relative: 1.7 % (ref 0.0–5.0)
HCT: 37.3 % (ref 36.0–46.0)
Hemoglobin: 12.4 g/dL (ref 12.0–15.0)
Lymphocytes Relative: 41.6 % (ref 12.0–46.0)
Monocytes Relative: 6.8 % (ref 3.0–12.0)
Neutro Abs: 4.4 10*3/uL (ref 1.4–7.7)
Platelets: 279 10*3/uL (ref 150.0–400.0)
RDW: 13.8 % (ref 11.5–14.6)
WBC: 8.9 10*3/uL (ref 4.5–10.5)

## 2012-07-02 LAB — BASIC METABOLIC PANEL
Calcium: 9.5 mg/dL (ref 8.4–10.5)
GFR: 158.53 mL/min (ref >60.00)
Glucose, Bld: 84 mg/dL (ref 70–99)
Potassium: 3.9 mEq/L (ref 3.5–5.1)
Sodium: 139 mEq/L (ref 135–145)

## 2012-07-02 LAB — HEPATIC FUNCTION PANEL
AST: 19 U/L (ref 0–37)
Alkaline Phosphatase: 61 U/L (ref 39–117)
Bilirubin, Direct: 0.1 mg/dL (ref 0.0–0.3)

## 2012-07-02 LAB — H. PYLORI ANTIBODY, IGG: H Pylori IgG: NEGATIVE

## 2012-08-20 ENCOUNTER — Ambulatory Visit (INDEPENDENT_AMBULATORY_CARE_PROVIDER_SITE_OTHER): Payer: Self-pay | Admitting: Internal Medicine

## 2012-08-20 ENCOUNTER — Encounter: Payer: Self-pay | Admitting: Internal Medicine

## 2012-08-20 VITALS — BP 122/80 | HR 83 | Wt 245.0 lb

## 2012-08-20 DIAGNOSIS — M545 Low back pain, unspecified: Secondary | ICD-10-CM

## 2012-08-20 NOTE — Progress Notes (Signed)
  Subjective:    Patient ID: Felicia Solis, female    DOB: 10-10-1965, 47 y.o.   MRN: [id number]  HPI Acute visit Complains of right-sided low back pain, for the last day. Intensity is as high as 5/10, no radiation, almost constant. She took some ibuprofen and it did help. Denies any recent injury, rash in the back on tingling in the lower extremities.  Also, her husband noted a "knot" at the back, no pain. The patient herself reports a knot in the epigastric area  Past Medical History  Diagnosis Date  . Angina     pt states she has occasional chest pains no other sxs.. seen in ed  at  [facility] .. all tsts negative.   . Asthma 5 yrs ago     told she had asthma.. does not remember doctors name.   . Shortness of breath     with exertion  . Tuberculosis     pt states she took 9 months of medicine for positive tb skin test.   . Anemia     anemia  when pregnant.  10 yrs ago.   Marland Kitchen Headache     years ago told h/a s were migraines.   . Depression     takes citalopram   Past Surgical History  Procedure Laterality Date  . Endometrial ablation    . Tubal ligation    . Laparoscopy      years ago ..pt does not know where surgery was done ... states" my stomach would blow up then go down"  . Dilation and curettage of uterus  1999  . Thyroidectomy  09/28/2011    Procedure: THYROIDECTOMY;  Surgeon: [name], MD;  Location: [facility] OR;  Service: ENT;  Laterality: Right;  RIGHT THYROIDECTOMY WITH FROZEN SECTION     Review of Systems See HPI    Objective:   Physical Exam  General -- alert, well-developed, BMI 39 .   Lungs -- normal respiratory effort, no intercostal retractions, no accessory muscle use, and normal breath sounds.   Abdomen--area of the knot corresponds to the tip of the sternum Back: Nontender to palpation at the lumbosacral spine, no rash.  The back is carefuly inspected and palpated, they only "knot" I found that he is a 1 cm blackhead at the upper left  back Extremities-- no pretibial edema bilaterally Neurologic-- alert & oriented X3 ; motor symmetric, DTRs symmetric (decrease bilateral knee jerk). Psych-- Cognition and judgment appear intact. Alert and cooperative with normal attention span and concentration.  not anxious appearing and not depressed appearing.      Assessment & Plan:   Acute lumbalgia, Back pain without red flag symptoms, recommend conservative treatment, see instructions. If not improving, consider further eval or a referral.  "knots", Findings consistent with a "blackhead" at the back and the tip of the sternum. Recommend observation.

## 2012-08-20 NOTE — Patient Instructions (Addendum)
No heavy lifting Motrin 200 mg 2 tablets every 6 hours as needed for pain. Always take it with food. Watch for stomach side effects (gastritis): nausea, stomach pain, change in the color of stools. Flexeril, a muscle relaxant, take one at night as needed for pain. Warm compress to the back 2 times a week. Call if not improving in the next 2 or 3 weeks, call anytime if symptoms severe.

## 2012-09-15 ENCOUNTER — Other Ambulatory Visit: Payer: Self-pay

## 2012-09-15 DIAGNOSIS — Z1231 Encounter for screening mammogram for malignant neoplasm of breast: Secondary | ICD-10-CM

## 2012-09-23 ENCOUNTER — Emergency Department (HOSPITAL_COMMUNITY)
Admission: EM | Admit: 2012-09-23 | Discharge: 2012-09-23 | Disposition: A | Discharge status: Home / Self Care | Payer: Self-pay | Attending: Emergency Medicine | Admitting: Emergency Medicine

## 2012-09-23 ENCOUNTER — Encounter (HOSPITAL_COMMUNITY): Payer: Self-pay | Admitting: Emergency Medicine

## 2012-09-23 DIAGNOSIS — Z8611 Personal history of tuberculosis: Secondary | ICD-10-CM | POA: Insufficient documentation

## 2012-09-23 DIAGNOSIS — J45909 Unspecified asthma, uncomplicated: Secondary | ICD-10-CM | POA: Insufficient documentation

## 2012-09-23 DIAGNOSIS — Z8659 Personal history of other mental and behavioral disorders: Secondary | ICD-10-CM | POA: Insufficient documentation

## 2012-09-23 DIAGNOSIS — Z8679 Personal history of other diseases of the circulatory system: Secondary | ICD-10-CM | POA: Insufficient documentation

## 2012-09-23 DIAGNOSIS — Z79899 Other long term (current) drug therapy: Secondary | ICD-10-CM | POA: Insufficient documentation

## 2012-09-23 DIAGNOSIS — IMO0001 Reserved for inherently not codable concepts without codable children: Secondary | ICD-10-CM | POA: Insufficient documentation

## 2012-09-23 DIAGNOSIS — Z862 Personal history of diseases of the blood and blood-forming organs and certain disorders involving the immune mechanism: Secondary | ICD-10-CM | POA: Insufficient documentation

## 2012-09-23 DIAGNOSIS — M722 Plantar fascial fibromatosis: Secondary | ICD-10-CM | POA: Insufficient documentation

## 2012-09-23 MED ORDER — NAPROXEN 500 MG PO TABS: 500.0000 mg | ORAL_TABLET | Freq: Two times a day (BID) | ORAL | Status: DC

## 2012-09-23 NOTE — ED Notes (Signed)
Pt c/o 7/10 left leg pain that starts at the foot and comes to the knee. Pt states that she has a knot on the bottom of her foot, however it has been there for years, however today the pain in the heel has gotten worse. Pt denies any recent trauma or injury.

## 2012-09-23 NOTE — ED Provider Notes (Signed)
History     CSN: [id number]  Arrival date & time 09/23/12  1221   First MD Initiated Contact with Patient 09/23/12 1229      Chief Complaint  Patient presents with  . Leg Pain    (Consider location/radiation/quality/duration/timing/severity/associated sxs/prior treatment) HPI Comments: Patient presents for some discomfort to her left heel that she states started yesterday. Onset was gradual and mildly worsened throughout the day. Patient describes the discomfort as a throbbing sensation. Patient states she woke up this morning and the pain had improved a decent amount but was still slightly uncomfortable. Patient states the throbbing is aggravated with weightbearing and with rotation of her left ankle. Timing is improved with rest. Patient took some Advil yesterday which she states helped the discomfort. She denies any recent trauma to her left lower extremity, fever, back pain, calf tenderness, lower extremity edema, numbness or tingling in her LLE as well as weakness.  The history is provided by the patient. No language interpreter was used.    Past Medical History  Diagnosis Date  . Angina     pt states she has occasional chest pains no other sxs.. seen in ed  at  [facility] .. all tsts negative.   . Asthma 5 yrs ago     told she had asthma.. does not remember doctors name.   . Shortness of breath     with exertion  . Tuberculosis     pt states she took 9 months of medicine for positive tb skin test.   . Anemia     anemia  when pregnant.  10 yrs ago.   Marland Kitchen Headache     years ago told h/a s were migraines.   . Depression     takes citalopram    Past Surgical History  Procedure Laterality Date  . Endometrial ablation    . Tubal ligation    . Laparoscopy      years ago ..pt does not know where surgery was done ... states" my stomach would blow up then go down"  . Dilation and curettage of uterus  1999  . Thyroidectomy  09/28/2011    Procedure: THYROIDECTOMY;  Surgeon: [name], MD;  Location: [facility] OR;  Service: ENT;  Laterality: Right;  RIGHT THYROIDECTOMY WITH FROZEN SECTION    Family History  Problem Relation Age of Onset  . Diabetes Mother   . Hypertension Mother   . Hyperlipidemia Mother   . Diabetes Sister   . Hypertension Father   . Hyperlipidemia Father   . Ovarian cancer Maternal Grandmother   . Colon cancer Paternal Grandfather   . Breast cancer Maternal Aunt   . Anesthesia problems Neg Hx   . Hypotension Neg Hx   . Malignant hyperthermia Neg Hx   . Pseudochol deficiency Neg Hx   . Heart disease Other     early cad    History  Substance Use Topics  . Smoking status: Never Smoker   . Smokeless tobacco: Never Used  . Alcohol Use: No    OB History   Grav Para Term Preterm Abortions TAB SAB Ect Mult Living                  Review of Systems  Constitutional: Negative for fever and chills.  Musculoskeletal: Positive for myalgias. Negative for back pain, joint swelling and gait problem.  Skin: Negative for color change and wound.  Neurological: Negative for weakness and numbness.  All other systems reviewed and are  negative.    Allergies  Review of patient's allergies indicates no known allergies.  Home Medications   Current Outpatient Rx  Name  Route  Sig  Dispense  Refill  . Aspirin-Acetaminophen-Caffeine (GOODYS EXTRA STRENGTH) 500-325-65 MG PACK   Oral   Take 1 packet by mouth every 6 (six) hours as needed. For pain.         . cyclobenzaprine (FLEXERIL) 10 MG tablet   Oral   Take 1 tablet (10 mg total) by mouth 3 (three) times daily as needed for muscle spasms.   30 tablet   0   . ibuprofen (ADVIL,MOTRIN) 200 MG tablet   Oral   Take 400 mg by mouth every 6 (six) hours as needed. For pain         . naproxen (NAPROSYN) 500 MG tablet   Oral   Take 1 tablet (500 mg total) by mouth 2 (two) times daily with a meal.   30 tablet   0     BP 132/73  Pulse 81  Temp(Src) 98.3 F (36.8 C) (Oral)  Resp 18  SpO2  100%  Physical Exam  Nursing note and vitals reviewed. Constitutional: She is oriented to person, place, and time. She appears well-developed and well-nourished. No distress.  HENT:  Head: Normocephalic and atraumatic.  Eyes: Conjunctivae are normal. Pupils are equal, round, and reactive to light. No scleral icterus.  Neck: Normal range of motion.  Cardiovascular: Intact distal pulses.   2+ dorsalis pedis and posterior tibial pulses b/l; capillary refill <2 seconds  Pulmonary/Chest: Effort normal.  Musculoskeletal: Normal range of motion.       Left knee: Normal.       Left ankle: Normal.       Left lower leg: Normal. She exhibits no tenderness.       Left foot: She exhibits tenderness. She exhibits no bony tenderness, no swelling, normal capillary refill, no deformity and no laceration.  Tenderness on palpation of the L sole at, approximately, the insertion site of the plantar fascia. No foreign bodies, swelling, erythema, abrasions or lacerations appreciated. ROM of L foot and exam of L ankle normal.  Neurological: She is alert and oriented to person, place, and time.  Skin: Skin is warm and dry. No rash noted. She is not diaphoretic. No erythema.  Psychiatric: She has a normal mood and affect. Her behavior is normal.    ED Course  Procedures (including critical care time)  Labs Reviewed - No data to display No results found.   1. Plantar fasciitis, left     MDM  Uncomplicated plantar fasciitis of the L foot. Patient neurovascularly intact and stable for d/c with PCP follow up; given instructions for rest, stretching, and NSAIDs for discomfort. Patient states comfort and understanding with this d/c plan with no unaddressed concerns.  Filed Vitals:   09/23/12 1230 09/23/12 1414  BP: 132/73 114/78  Pulse: 81 65  Temp: 98.3 F (36.8 C) 98.3 F (36.8 C)  TempSrc: Oral Oral  Resp: 18 18  SpO2: 100% 100%           Antony Madura, PA-C 09/25/12 1947

## 2012-09-26 NOTE — ED Provider Notes (Signed)
  Medical screening examination/treatment/procedure(s) were performed by non-physician practitioner and as supervising physician I was immediately available for consultation/collaboration.    Gerhard Munch, MD 09/26/12 [id number]

## 2012-10-13 ENCOUNTER — Ambulatory Visit: Payer: Self-pay

## 2012-11-30 ENCOUNTER — Emergency Department (HOSPITAL_COMMUNITY)
Admission: EM | Admit: 2012-11-30 | Discharge: 2012-11-30 | Disposition: A | Discharge status: Home / Self Care | Payer: Worker's Compensation | Attending: Emergency Medicine | Admitting: Emergency Medicine

## 2012-11-30 ENCOUNTER — Encounter (HOSPITAL_COMMUNITY): Payer: Self-pay | Admitting: Emergency Medicine

## 2012-11-30 DIAGNOSIS — Z7982 Long term (current) use of aspirin: Secondary | ICD-10-CM | POA: Insufficient documentation

## 2012-11-30 DIAGNOSIS — J45909 Unspecified asthma, uncomplicated: Secondary | ICD-10-CM | POA: Insufficient documentation

## 2012-11-30 DIAGNOSIS — Z791 Long term (current) use of non-steroidal anti-inflammatories (NSAID): Secondary | ICD-10-CM | POA: Insufficient documentation

## 2012-11-30 DIAGNOSIS — M62830 Muscle spasm of back: Secondary | ICD-10-CM

## 2012-11-30 DIAGNOSIS — Z862 Personal history of diseases of the blood and blood-forming organs and certain disorders involving the immune mechanism: Secondary | ICD-10-CM | POA: Insufficient documentation

## 2012-11-30 DIAGNOSIS — IMO0001 Reserved for inherently not codable concepts without codable children: Secondary | ICD-10-CM | POA: Insufficient documentation

## 2012-11-30 DIAGNOSIS — F3289 Other specified depressive episodes: Secondary | ICD-10-CM | POA: Insufficient documentation

## 2012-11-30 DIAGNOSIS — Z79899 Other long term (current) drug therapy: Secondary | ICD-10-CM | POA: Insufficient documentation

## 2012-11-30 DIAGNOSIS — F329 Major depressive disorder, single episode, unspecified: Secondary | ICD-10-CM | POA: Insufficient documentation

## 2012-11-30 DIAGNOSIS — M5380 Other specified dorsopathies, site unspecified: Secondary | ICD-10-CM | POA: Insufficient documentation

## 2012-11-30 DIAGNOSIS — I209 Angina pectoris, unspecified: Secondary | ICD-10-CM | POA: Insufficient documentation

## 2012-11-30 DIAGNOSIS — Z8619 Personal history of other infectious and parasitic diseases: Secondary | ICD-10-CM | POA: Insufficient documentation

## 2012-11-30 MED ORDER — METHOCARBAMOL 500 MG PO TABS: 500.0000 mg | ORAL_TABLET | Freq: Two times a day (BID) | ORAL | Status: DC | PRN

## 2012-11-30 MED ORDER — TRAMADOL HCL 50 MG PO TABS: 50.0000 mg | ORAL_TABLET | Freq: Four times a day (QID) | ORAL | Status: DC | PRN

## 2012-11-30 NOTE — ED Notes (Signed)
Pt reports 5/10 right sided lower back pain that the patient describes as dull. Pt reports hurting her back on April 29th at work, which she has been obtaining treatment with PT. Pt states that she came in today because she is having intermittent sharp pains in that area, which is worse while moving her body in certain movements.

## 2012-11-30 NOTE — ED Provider Notes (Signed)
History     CSN: [id number]  Arrival date & time 11/30/12  1255   First MD Initiated Contact with Patient 11/30/12 1443      Chief Complaint  Patient presents with  . Back Pain    (Consider location/radiation/quality/duration/timing/severity/associated sxs/prior treatment) HPI Comments: Patient presents to the ED for right-sided back pain x 2 days. Pt injured her back at work in April as has continued doing physical therapy exercises at home.  Admits she may have overdone it a few days ago.  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 She does a lot of lifting, bending, and assisting resident with their daily activities which exacerbate her pain.  Patient denies any numbness or paresthesias in her lower extremities.  No loss of bowel or bladder function.  Has not taken any meds for her sx.  The history is provided by the patient.    Past Medical History  Diagnosis Date  . Angina     pt states she has occasional chest pains no other sxs.. seen in ed  at  [facility] .. all tsts negative.   . Asthma 5 yrs ago     told she had asthma.. does not remember doctors name.   . Shortness of breath     with exertion  . Tuberculosis     pt states she took 9 months of medicine for positive tb skin test.   . Anemia     anemia  when pregnant.  10 yrs ago.   Marland Kitchen Headache(784.0)     years ago told h/a s were migraines.   . Depression     takes citalopram    Past Surgical History  Procedure Laterality Date  . Endometrial ablation    . Tubal ligation    . Laparoscopy      years ago ..pt does not know where surgery was done ... states" my stomach would blow up then go down"  . Dilation and curettage of uterus  1999  . Thyroidectomy  09/28/2011    Procedure: THYROIDECTOMY;  Surgeon: [name], MD;  Location: [facility] OR;  Service: ENT;  Laterality: Right;  RIGHT THYROIDECTOMY WITH FROZEN SECTION     Family History  Problem Relation Age of Onset  . Diabetes Mother   . Hypertension Mother   . Hyperlipidemia Mother   . Diabetes Sister   . Hypertension Father   . Hyperlipidemia Father   . Ovarian cancer Maternal Grandmother   . Colon cancer Paternal Grandfather   . Breast cancer Maternal Aunt   . Anesthesia problems Neg Hx   . Hypotension Neg Hx   . Malignant hyperthermia Neg Hx   . Pseudochol deficiency Neg Hx   . Heart disease Other     early cad    History  Substance Use Topics  . Smoking status: Never Smoker   . Smokeless tobacco: Never Used  . Alcohol Use: No    OB History   Grav Para Term Preterm Abortions TAB SAB Ect Mult Living                  Review of Systems  Musculoskeletal: Positive for myalgias.  All other systems reviewed and are negative.    Allergies  Review of patient's allergies indicates no known allergies.  Home Medications   Current Outpatient Rx  Name  Route  Sig  Dispense  Refill  .  Aspirin-Acetaminophen-Caffeine (GOODYS EXTRA STRENGTH) 500-325-65 MG PACK   Oral   Take 1 packet by mouth every 6 (six) hours as needed. For pain.         . cyclobenzaprine (FLEXERIL) 10 MG tablet   Oral   Take 1 tablet (10 mg total) by mouth 3 (three) times daily as needed for muscle spasms.   30 tablet   0   . ibuprofen (ADVIL,MOTRIN) 200 MG tablet   Oral   Take 400 mg by mouth every 6 (six) hours as needed. For pain         . naproxen (NAPROSYN) 500 MG tablet   Oral   Take 1 tablet (500 mg total) by mouth 2 (two) times daily with a meal.   30 tablet   0     BP 148/86  Pulse 84  Temp(Src) 98.7 F (37.1 C) (Oral)  Resp 23  SpO2 100%  Physical Exam  Nursing note and vitals reviewed. Constitutional: She is oriented to person, place, and time. She appears well-developed and well-nourished.  HENT:  Head: Normocephalic and atraumatic.  Eyes: Conjunctivae and EOM are normal.  Neck: Normal range of motion.  Cardiovascular:  Normal rate, regular rhythm and normal heart sounds.   Pulmonary/Chest: Effort normal and breath sounds normal. No respiratory distress.  Musculoskeletal: Normal range of motion.       Thoracic back: She exhibits pain and spasm. She exhibits normal range of motion, no tenderness, no bony tenderness, no swelling, no edema, no deformity and no laceration.       Back:  Spasm present along right thoracic paraspinal muscles  Neurological: She is alert and oriented to person, place, and time.  Skin: Skin is warm and dry.  Psychiatric: She has a normal mood and affect.    ED Course  Procedures (including critical care time)  Labs Reviewed - No data to display No results found.   1. Muscle spasm of back       MDM   Muscle spasm present along the right thoracic paraspinal muscles. Rx Robaxin and tramadol. Instructed on supportive measures including heat therapy.  FU with PCP if sx not improving in the next few days.  Discussed plan with patient, she agreed. Return precautions advised.        Garlon Hatchet, PA-C 11/30/12 1549

## 2012-12-02 NOTE — ED Provider Notes (Signed)
Medical screening examination/treatment/procedure(s) were performed by non-physician practitioner and as supervising physician I was immediately available for consultation/collaboration. Ilana Prezioso, MD, FACEP   Vicke Plotner L Zaliyah Meikle, MD 12/02/12 1627 

## 2013-03-05 ENCOUNTER — Emergency Department (HOSPITAL_COMMUNITY)
Admission: EM | Admit: 2013-03-05 | Discharge: 2013-03-06 | Disposition: A | Discharge status: Home / Self Care | Payer: Self-pay | Attending: Emergency Medicine | Admitting: Emergency Medicine

## 2013-03-05 ENCOUNTER — Encounter (HOSPITAL_COMMUNITY): Payer: Self-pay | Admitting: Family Medicine

## 2013-03-05 DIAGNOSIS — L539 Erythematous condition, unspecified: Secondary | ICD-10-CM | POA: Insufficient documentation

## 2013-03-05 DIAGNOSIS — Z862 Personal history of diseases of the blood and blood-forming organs and certain disorders involving the immune mechanism: Secondary | ICD-10-CM | POA: Insufficient documentation

## 2013-03-05 DIAGNOSIS — Z8611 Personal history of tuberculosis: Secondary | ICD-10-CM | POA: Insufficient documentation

## 2013-03-05 DIAGNOSIS — J45909 Unspecified asthma, uncomplicated: Secondary | ICD-10-CM | POA: Insufficient documentation

## 2013-03-05 DIAGNOSIS — L089 Local infection of the skin and subcutaneous tissue, unspecified: Secondary | ICD-10-CM

## 2013-03-05 DIAGNOSIS — L723 Sebaceous cyst: Secondary | ICD-10-CM | POA: Insufficient documentation

## 2013-03-05 DIAGNOSIS — Z8659 Personal history of other mental and behavioral disorders: Secondary | ICD-10-CM | POA: Insufficient documentation

## 2013-03-05 DIAGNOSIS — Z8679 Personal history of other diseases of the circulatory system: Secondary | ICD-10-CM | POA: Insufficient documentation

## 2013-03-05 DIAGNOSIS — Z8669 Personal history of other diseases of the nervous system and sense organs: Secondary | ICD-10-CM | POA: Insufficient documentation

## 2013-03-05 NOTE — ED Notes (Signed)
Patient states that she has "a big knot on her back." states it has been present for 4-5 days and is getting bigger. States area is hard and is getting a smell to it. Denies drainage. Raised area to mid-back noted; no drainage noted; firm to palpation.

## 2013-03-06 MED ORDER — CEPHALEXIN 500 MG PO CAPS: 500.0000 mg | ORAL_CAPSULE | Freq: Four times a day (QID) | ORAL | Status: DC

## 2013-03-06 MED ORDER — LIDOCAINE-EPINEPHRINE (PF) 2 %-1:200000 IJ SOLN
10.0000 mL | Freq: Once | INTRAMUSCULAR | Status: AC
  Administered 2013-03-06: 10 mL

## 2013-03-06 NOTE — ED Provider Notes (Signed)
CSN: [id number]     Arrival date & time 03/05/13  2315 History   First MD Initiated Contact with Patient 03/06/13 0119     Chief Complaint  Patient presents with  . Cyst   HPI  History provided by the patient. The patient is a 47 year old female presenting with worsening swelling and tenderness of the skin to her upper back. Patient states that she has been having increasing pain and swelling with a large knot to her back for the past 4 days. She states that there is also a slight foul smell to the area but has not noticed any specific bleeding or drainage. She does report having a very small pea marble-sized knot over the same area for the last 3 months. She states this never caused any problems or discomfort. She does state that her primary care provider had once told her that this was a boil.  She has not tried to squeeze or pop the area. She has not used any treatments for the symptoms. She denies any associated fever, chills or sweats. Denies having similar symptoms previously.     Past Medical History  Diagnosis Date  . Angina     pt states she has occasional chest pains no other sxs.. seen in ed  at  [facility] .. all tsts negative.   . Asthma 5 yrs ago     told she had asthma.. does not remember doctors name.   . Shortness of breath     with exertion  . Tuberculosis     pt states she took 9 months of medicine for positive tb skin test.   . Anemia     anemia  when pregnant.  10 yrs ago.   Marland Kitchen Headache(784.0)     years ago told h/a s were migraines.   . Depression     takes citalopram   Past Surgical History  Procedure Laterality Date  . Endometrial ablation    . Tubal ligation    . Laparoscopy      years ago ..pt does not know where surgery was done ... states" my stomach would blow up then go down"  . Dilation and curettage of uterus  1999  . Thyroidectomy  09/28/2011    Procedure: THYROIDECTOMY;  Surgeon: [name], MD;  Location: [facility] OR;  Service: ENT;  Laterality:  Right;  RIGHT THYROIDECTOMY WITH FROZEN SECTION   Family History  Problem Relation Age of Onset  . Diabetes Mother   . Hypertension Mother   . Hyperlipidemia Mother   . Diabetes Sister   . Hypertension Father   . Hyperlipidemia Father   . Ovarian cancer Maternal Grandmother   . Colon cancer Paternal Grandfather   . Breast cancer Maternal Aunt   . Anesthesia problems Neg Hx   . Hypotension Neg Hx   . Malignant hyperthermia Neg Hx   . Pseudochol deficiency Neg Hx   . Heart disease Other     early cad   History  Substance Use Topics  . Smoking status: Never Smoker   . Smokeless tobacco: Never Used  . Alcohol Use: No   OB History   Grav Para Term Preterm Abortions TAB SAB Ect Mult Living                 Review of Systems  Constitutional: Negative for fever, chills and diaphoresis.  All other systems reviewed and are negative.    Allergies  Review of patient's allergies indicates no known allergies.  Home  Medications   Current Outpatient Rx  Name  Route  Sig  Dispense  Refill  . Aspirin-Acetaminophen-Caffeine (GOODYS EXTRA STRENGTH) 500-325-65 MG PACK   Oral   Take 1 packet by mouth every 6 (six) hours as needed. For pain.         Marland Kitchen ibuprofen (ADVIL,MOTRIN) 200 MG tablet   Oral   Take 400 mg by mouth every 6 (six) hours as needed. For pain          BP 127/66  Pulse 84  Temp(Src) 98.4 F (36.9 C) (Oral)  Resp 18  SpO2 98% Physical Exam  Nursing note and vitals reviewed. Constitutional: She is oriented to person, place, and time. She appears well-developed and well-nourished. No distress.  HENT:  Head: Normocephalic.  Cardiovascular: Normal rate and regular rhythm.   Pulmonary/Chest: Effort normal and breath sounds normal. No respiratory distress. She has no wheezes. She has no rales.  Abdominal: Soft.  Neurological: She is alert and oriented to person, place, and time.  Skin: Skin is warm and dry. There is erythema.  Nodular area of mild erythema to  the mid upper left back with tenderness to palpation. Slight induration. No significant fluctuance. No pointing.  Psychiatric: She has a normal mood and affect. Her behavior is normal.    ED Course  Procedures   INCISION AND DRAINAGE Performed by: Angus Seller Consent: Verbal consent obtained. Risks and benefits: risks, benefits and alternatives were discussed Type: Infected sebaceous cyst  Patient was prepped in a sterile fashion.  Body area: Left upper mid back  Anesthesia: local infiltration  Incision was made with a scalpel.  Local anesthetic: lidocaine 2% with epinephrine  Anesthetic total: 5 ml  Complexity: complex Capsule was torn but removed in pieces.  Pocket was rinsed and irrigated extensively with suction    Incision closure: 3-0 Prolene  Number of sutures: 5   Technique: Simple interrupted, horizontal mattress    Patient tolerance: Patient tolerated the procedure well with no immediate complications.         MDM   1. Infected sebaceous cyst of skin      1:45AM patient seen and evaluated. She is well appearing in no acute distress. Does not appear severely ill or toxic.  Patient tolerated procedure well. We'll plan to cover with Keflex. There did not appear to be significant infection. Patient given strict return precautions. She will followup with her primary care provider    Angus Seller, PA-C 03/06/13 [phone]

## 2013-03-12 NOTE — ED Provider Notes (Signed)
Medical screening examination/treatment/procedure(s) were performed by non-physician practitioner and as supervising physician I was immediately available for consultation/collaboration.    Toy Baker, MD 03/12/13 [phone]

## 2013-04-06 ENCOUNTER — Ambulatory Visit: Payer: Self-pay

## 2013-05-31 ENCOUNTER — Emergency Department (HOSPITAL_COMMUNITY)
Admission: EM | Admit: 2013-05-31 | Discharge: 2013-05-31 | Disposition: A | Discharge status: Home / Self Care | Payer: Self-pay | Attending: Emergency Medicine | Admitting: Emergency Medicine

## 2013-05-31 ENCOUNTER — Encounter (HOSPITAL_COMMUNITY): Payer: Self-pay | Admitting: Emergency Medicine

## 2013-05-31 DIAGNOSIS — Z8611 Personal history of tuberculosis: Secondary | ICD-10-CM | POA: Insufficient documentation

## 2013-05-31 DIAGNOSIS — F329 Major depressive disorder, single episode, unspecified: Secondary | ICD-10-CM | POA: Insufficient documentation

## 2013-05-31 DIAGNOSIS — D259 Leiomyoma of uterus, unspecified: Secondary | ICD-10-CM | POA: Insufficient documentation

## 2013-05-31 DIAGNOSIS — R350 Frequency of micturition: Secondary | ICD-10-CM | POA: Insufficient documentation

## 2013-05-31 DIAGNOSIS — Z9851 Tubal ligation status: Secondary | ICD-10-CM | POA: Insufficient documentation

## 2013-05-31 DIAGNOSIS — Z862 Personal history of diseases of the blood and blood-forming organs and certain disorders involving the immune mechanism: Secondary | ICD-10-CM | POA: Insufficient documentation

## 2013-05-31 DIAGNOSIS — M549 Dorsalgia, unspecified: Secondary | ICD-10-CM | POA: Insufficient documentation

## 2013-05-31 DIAGNOSIS — Z3202 Encounter for pregnancy test, result negative: Secondary | ICD-10-CM | POA: Insufficient documentation

## 2013-05-31 DIAGNOSIS — Z87442 Personal history of urinary calculi: Secondary | ICD-10-CM | POA: Insufficient documentation

## 2013-05-31 DIAGNOSIS — R109 Unspecified abdominal pain: Secondary | ICD-10-CM | POA: Insufficient documentation

## 2013-05-31 DIAGNOSIS — Z8679 Personal history of other diseases of the circulatory system: Secondary | ICD-10-CM | POA: Insufficient documentation

## 2013-05-31 DIAGNOSIS — D219 Benign neoplasm of connective and other soft tissue, unspecified: Secondary | ICD-10-CM

## 2013-05-31 DIAGNOSIS — F3289 Other specified depressive episodes: Secondary | ICD-10-CM | POA: Insufficient documentation

## 2013-05-31 DIAGNOSIS — J45909 Unspecified asthma, uncomplicated: Secondary | ICD-10-CM | POA: Insufficient documentation

## 2013-05-31 DIAGNOSIS — Z9889 Other specified postprocedural states: Secondary | ICD-10-CM | POA: Insufficient documentation

## 2013-05-31 DIAGNOSIS — R300 Dysuria: Secondary | ICD-10-CM | POA: Insufficient documentation

## 2013-05-31 LAB — URINALYSIS, ROUTINE W REFLEX MICROSCOPIC
Ketones, ur: NEGATIVE mg/dL
Leukocytes, UA: NEGATIVE
Nitrite: NEGATIVE
Protein, ur: NEGATIVE mg/dL
Urobilinogen, UA: 1 mg/dL (ref 0.0–1.0)

## 2013-05-31 LAB — POCT I-STAT, CHEM 8
BUN: 9 mg/dL (ref 6–23)
Chloride: 104 mEq/L (ref 96–112)
Potassium: 4.1 mEq/L (ref 3.5–5.1)
Sodium: 141 mEq/L (ref 135–145)
TCO2: 25 mmol/L (ref 0–100)

## 2013-05-31 MED ORDER — PROMETHAZINE HCL 25 MG PO TABS: 25.0000 mg | ORAL_TABLET | Freq: Four times a day (QID) | ORAL | Status: DC | PRN

## 2013-05-31 MED ORDER — OXYCODONE-ACETAMINOPHEN 5-325 MG PO TABS
2.0000 | ORAL_TABLET | Freq: Once | ORAL | Status: AC
  Administered 2013-05-31: 2 via ORAL
  Filled 2013-05-31: qty 2

## 2013-05-31 MED ORDER — HYDROCODONE-ACETAMINOPHEN 5-325 MG PO TABS: 1.0000 | ORAL_TABLET | Freq: Four times a day (QID) | ORAL | Status: DC | PRN

## 2013-05-31 MED ORDER — ONDANSETRON 8 MG PO TBDP
8.0000 mg | ORAL_TABLET | Freq: Once | ORAL | Status: AC
  Administered 2013-05-31: 8 mg via ORAL
  Filled 2013-05-31: qty 1

## 2013-05-31 NOTE — ED Provider Notes (Signed)
CSN: [id number]     Arrival date & time 05/31/13  1935 History   First MD Initiated Contact with Patient 05/31/13 2049     Chief Complaint  Patient presents with  . Abdominal Pain  . Back Pain   (Consider location/radiation/quality/duration/timing/severity/associated sxs/prior Treatment) HPI Comments: Patient is a 47 year old female with history of asthma, kidney stones, fibroids, depression who presents today with lower abdominal pain since last night. The pain radiates to her back. The pain is sharp and feels similar to contractions. She has associated urinary frequency. The pain is worse when she urinates. She has no urinary urgency, hematuria. She reports that this feels similar to past kidney stones. She denies any fever, chills, nausea, vomiting, diarrhea, constipation. No no longer gets her menstrual cycle.   The history is provided by the patient. No language interpreter was used.    Past Medical History  Diagnosis Date  . Angina     pt states she has occasional chest pains no other sxs.. seen in ed  at  [facility] .. all tsts negative.   . Asthma 5 yrs ago     told she had asthma.. does not remember doctors name.   . Shortness of breath     with exertion  . Tuberculosis     pt states she took 9 months of medicine for positive tb skin test.   . Anemia     anemia  when pregnant.  10 yrs ago.   Marland Kitchen Headache(784.0)     years ago told h/a s were migraines.   . Depression     takes citalopram  . Renal disorder     kidney stones   Past Surgical History  Procedure Laterality Date  . Endometrial ablation    . Tubal ligation    . Laparoscopy      years ago ..pt does not know where surgery was done ... states" my stomach would blow up then go down"  . Dilation and curettage of uterus  1999  . Thyroidectomy  09/28/2011    Procedure: THYROIDECTOMY;  Surgeon: [name], MD;  Location: [facility] OR;  Service: ENT;  Laterality: Right;  RIGHT THYROIDECTOMY WITH FROZEN SECTION   Family  History  Problem Relation Age of Onset  . Diabetes Mother   . Hypertension Mother   . Hyperlipidemia Mother   . Diabetes Sister   . Hypertension Father   . Hyperlipidemia Father   . Ovarian cancer Maternal Grandmother   . Colon cancer Paternal Grandfather   . Breast cancer Maternal Aunt   . Anesthesia problems Neg Hx   . Hypotension Neg Hx   . Malignant hyperthermia Neg Hx   . Pseudochol deficiency Neg Hx   . Heart disease Other     early cad   History  Substance Use Topics  . Smoking status: Never Smoker   . Smokeless tobacco: Never Used  . Alcohol Use: No   OB History   Grav Para Term Preterm Abortions TAB SAB Ect Mult Living                 Review of Systems  Constitutional: Negative for fever and chills.  Respiratory: Negative for shortness of breath.   Cardiovascular: Negative for chest pain.  Gastrointestinal: Positive for abdominal pain. Negative for nausea, vomiting, diarrhea and constipation.  Genitourinary: Positive for dysuria and frequency. Negative for urgency, decreased urine volume, vaginal bleeding, vaginal discharge and vaginal pain.  All other systems reviewed and are negative.  Allergies  Review of patient's allergies indicates no known allergies.  Home Medications   Current Outpatient Rx  Name  Route  Sig  Dispense  Refill  . Aspirin-Acetaminophen-Caffeine (GOODYS EXTRA STRENGTH) 500-325-65 MG PACK   Oral   Take 1 packet by mouth every 6 (six) hours as needed. For pain.         Marland Kitchen ibuprofen (ADVIL,MOTRIN) 200 MG tablet   Oral   Take 400 mg by mouth every 6 (six) hours as needed. For pain          BP 136/87  Pulse 86  Temp(Src) 99.6 F (37.6 C) (Oral)  Resp 17  Ht 5\' 6"  (1.676 m)  Wt 245 lb (111.131 kg)  BMI 39.56 kg/m2  SpO2 100% Physical Exam  Nursing note and vitals reviewed. Constitutional: She is oriented to person, place, and time. She appears well-developed and well-nourished. No distress.  HENT:  Head: Normocephalic  and atraumatic.  Right Ear: External ear normal.  Left Ear: External ear normal.  Nose: Nose normal.  Mouth/Throat: Oropharynx is clear and moist.  Eyes: Conjunctivae are normal.  Neck: Normal range of motion.  Cardiovascular: Normal rate, regular rhythm and normal heart sounds.   Pulmonary/Chest: Effort normal and breath sounds normal. No stridor. No respiratory distress. She has no wheezes. She has no rales.  Abdominal: Soft. She exhibits no distension. There is tenderness in the suprapubic area. There is no rigidity and no guarding.  Genitourinary: Vagina normal. No labial fusion. There is no rash, tenderness, lesion or injury on the right labia. There is no rash, tenderness, lesion or injury on the left labia. Cervix exhibits no motion tenderness, no discharge and no friability. Right adnexum displays no mass, no tenderness and no fullness. Left adnexum displays no mass, no tenderness and no fullness. No erythema, tenderness or bleeding around the vagina. No foreign body around the vagina. No signs of injury around the vagina. No vaginal discharge found.  Musculoskeletal: Normal range of motion.  Neurological: She is alert and oriented to person, place, and time. She has normal strength.  Skin: Skin is warm and dry. She is not diaphoretic. No erythema.  Psychiatric: She has a normal mood and affect. Her behavior is normal.    ED Course  Procedures (including critical care time) Labs Review Labs Reviewed  WET PREP, GENITAL - Abnormal; Notable for the following:    WBC, Wet Prep HPF POC RARE (*)    All other components within normal limits  URINALYSIS, ROUTINE W REFLEX MICROSCOPIC - Abnormal; Notable for the following:    APPearance CLOUDY (*)    All other components within normal limits  POCT I-STAT, CHEM 8 - Abnormal; Notable for the following:    Glucose, Bld 108 (*)    Calcium, Ion 1.26 (*)    All other components within normal limits  GC/CHLAMYDIA PROBE AMP  PREGNANCY, URINE    Imaging Review No results found.  EKG Interpretation   None       MDM   1. Abdominal pain   2. Fibroids    Patient presents with abdominal pain since last night. UA, chem 8, and wet prep are unremarkable. Physical exam is benign. No concern for appendicitis, bowel obstruction, bowel perforation, cholecystitis, diverticulitis, PID, or ectopic pregnancy. Pain is likely due to fibroids. Pain is very well controlled with 1 dose of oxycodone. Patient understands she needs follow up with her OB/GYN. Dr. Dierdre Highman evaluated the patient and agrees with plan. Return instructions given. Vital  signs stable for discharge.       Mora Bellman, PA-C 05/31/13 2340

## 2013-05-31 NOTE — ED Notes (Signed)
Pt c/o low mid abd pain onset last night, pt also c/o low mid back pain. Denies known fever. Denies n/v/d. Denies urinary s/s

## 2013-06-01 LAB — GC/CHLAMYDIA PROBE AMP: GC Probe RNA: NEGATIVE

## 2013-06-01 NOTE — ED Provider Notes (Signed)
Medical screening examination/treatment/procedure(s) were conducted as a shared visit with non-physician practitioner(s) and myself.  I personally evaluated the patient during the encounter.  H/o fibroids, c/o midline pelvic pain. No tenderness on ABD exam, no CVAT. symptoms resolved with percocet. PT has GYN follow up and is stable for discharge and close outpatient follow up.   Sunnie Nielsen, MD 06/01/13 [phone]

## 2013-08-14 ENCOUNTER — Ambulatory Visit: Payer: Self-pay

## 2013-08-17 ENCOUNTER — Ambulatory Visit
Admission: RE | Admit: 2013-08-17 | Discharge: 2013-08-17 | Disposition: A | Discharge status: Home / Self Care | Payer: Self-pay | Source: Ambulatory Visit

## 2013-08-17 DIAGNOSIS — Z1231 Encounter for screening mammogram for malignant neoplasm of breast: Secondary | ICD-10-CM

## 2013-08-18 ENCOUNTER — Ambulatory Visit: Payer: Self-pay | Admitting: Family Medicine

## 2013-08-19 ENCOUNTER — Ambulatory Visit (INDEPENDENT_AMBULATORY_CARE_PROVIDER_SITE_OTHER): Payer: Self-pay | Admitting: Family Medicine

## 2013-08-19 ENCOUNTER — Encounter: Payer: Self-pay | Admitting: Family Medicine

## 2013-08-19 VITALS — BP 132/80 | HR 72 | Temp 98.8°F | Wt 245.0 lb

## 2013-08-19 DIAGNOSIS — F341 Dysthymic disorder: Secondary | ICD-10-CM

## 2013-08-19 DIAGNOSIS — F411 Generalized anxiety disorder: Secondary | ICD-10-CM

## 2013-08-19 DIAGNOSIS — F418 Other specified anxiety disorders: Secondary | ICD-10-CM

## 2013-08-19 DIAGNOSIS — R1013 Epigastric pain: Secondary | ICD-10-CM

## 2013-08-19 LAB — POCT URINALYSIS DIPSTICK
BILIRUBIN UA: NEGATIVE
GLUCOSE UA: NEGATIVE
Ketones, UA: NEGATIVE
Leukocytes, UA: NEGATIVE
NITRITE UA: NEGATIVE
PH UA: 6
RBC UA: NEGATIVE
UROBILINOGEN UA: 0.2

## 2013-08-19 LAB — CBC WITH DIFFERENTIAL/PLATELET
BASOS ABS: 0 10*3/uL (ref 0.0–0.1)
BASOS PCT: 0 % (ref 0–1)
EOS ABS: 0.2 10*3/uL (ref 0.0–0.7)
EOS PCT: 2 % (ref 0–5)
HCT: 37.5 % (ref 36.0–46.0)
Hemoglobin: 12.5 g/dL (ref 12.0–15.0)
Lymphocytes Relative: 46 % (ref 12–46)
Lymphs Abs: 4.6 10*3/uL — ABNORMAL HIGH (ref 0.7–4.0)
MCH: 27.7 pg (ref 26.0–34.0)
MCHC: 33.3 g/dL (ref 30.0–36.0)
MCV: 83.1 fL (ref 78.0–100.0)
Monocytes Absolute: 0.7 10*3/uL (ref 0.1–1.0)
Monocytes Relative: 7 % (ref 3–12)
NEUTROS PCT: 45 % (ref 43–77)
Neutro Abs: 4.5 10*3/uL (ref 1.7–7.7)
PLATELETS: 347 10*3/uL (ref 150–400)
RBC: 4.51 MIL/uL (ref 3.87–5.11)
RDW: 13.8 % (ref 11.5–15.5)
WBC: 9.9 10*3/uL (ref 4.0–10.5)

## 2013-08-19 MED ORDER — CITALOPRAM HYDROBROMIDE 10 MG PO TABS: 10.0000 mg | ORAL_TABLET | Freq: Every day | ORAL | Status: DC

## 2013-08-19 MED ORDER — OMEPRAZOLE 40 MG PO CPDR: 40.0000 mg | DELAYED_RELEASE_CAPSULE | Freq: Every day | ORAL | Status: DC

## 2013-08-19 MED ORDER — GI COCKTAIL ~~LOC~~
30.0000 mL | Freq: Once | ORAL | Status: AC
  Administered 2013-08-19: 30 mL via ORAL

## 2013-08-19 NOTE — Assessment & Plan Note (Signed)
celexa 10 mg daily rto 1 month or sooner prn

## 2013-08-19 NOTE — Progress Notes (Signed)
  Subjective:     Felicia Solis is a 48 y.o. female who presents for evaluation of abdominal pain. Onset was 1 month ago. Symptoms have been gradually worsening. The pain is described as cramping, and is 10/10 in intensity. Pain is located in the epigastric region without radiation.  Aggravating factors: eating.  Alleviating factors: antacids, belching and flatus. Associated symptoms: belching, constipation, diarrhea, fever, melena and nausea. The patient denies anorexia,  arthralagias, chills, dysuria, frequency, headache, hematochezia, hematuria, myalgias and vomiting.  The patient's history has been marked as reviewed and updated as appropriate.  Review of Systems Pertinent items are noted in HPI.     Objective:    BP 132/80  Pulse 72  Temp(Src) 98.8 F (37.1 C) (Oral)  Wt 245 lb (111.131 kg)  SpO2 97% General appearance: alert, cooperative, appears stated age and no distress Throat: lips, mucosa, and tongue normal; teeth and gums normal Neck: no adenopathy, supple, symmetrical, trachea midline and thyroid not enlarged, symmetric, no tenderness/mass/nodules Lungs: clear to auscultation bilaterally Heart: S1, S2 normal Abdomen: abnormal findings:  mild tenderness in the epigastrium   psych-- pt c/o anxiety and crying a lot.  She is not homicidal or suicidal Assessment:    Abdominal pain, likely secondary to gerd r/o pud .    Plan:    The diagnosis was discussed with the patient and evaluation and treatment plans outlined. See orders for lab and imaging studies. Further follow-up plans will be based on outcome of lab/imaging studies; see orders. refer to GI

## 2013-08-19 NOTE — Progress Notes (Signed)
Pre visit review using our clinic review tool, if applicable. No additional management support is needed unless otherwise documented below in the visit note. 

## 2013-08-19 NOTE — Patient Instructions (Signed)

## 2013-08-20 LAB — HEPATIC FUNCTION PANEL
ALBUMIN: 4.5 g/dL (ref 3.5–5.2)
ALT: 25 U/L (ref 0–35)
AST: 20 U/L (ref 0–37)
Alkaline Phosphatase: 72 U/L (ref 39–117)
Bilirubin, Direct: 0.1 mg/dL (ref 0.0–0.3)
Indirect Bilirubin: 0.2 mg/dL (ref 0.2–1.2)
TOTAL PROTEIN: 7.4 g/dL (ref 6.0–8.3)
Total Bilirubin: 0.3 mg/dL (ref 0.2–1.2)

## 2013-08-20 LAB — BASIC METABOLIC PANEL
BUN: 9 mg/dL (ref 6–23)
CHLORIDE: 104 meq/L (ref 96–112)
CO2: 27 mEq/L (ref 19–32)
CREATININE: 0.56 mg/dL (ref 0.50–1.10)
Calcium: 10.3 mg/dL (ref 8.4–10.5)
GLUCOSE: 110 mg/dL — AB (ref 70–99)
POTASSIUM: 3.7 meq/L (ref 3.5–5.3)
Sodium: 139 mEq/L (ref 135–145)

## 2013-08-20 LAB — TSH: TSH: 1.06 u[IU]/mL (ref 0.350–4.500)

## 2013-08-20 LAB — H. PYLORI ANTIBODY, IGG: H PYLORI IGG: 2.97 {ISR} — AB

## 2013-08-24 ENCOUNTER — Telehealth: Payer: Self-pay

## 2013-08-24 MED ORDER — CLARITHROMYCIN 500 MG PO TABS: 500.0000 mg | ORAL_TABLET | Freq: Two times a day (BID) | ORAL | Status: DC

## 2013-08-24 MED ORDER — FLUCONAZOLE 150 MG PO TABS: 150.0000 mg | ORAL_TABLET | Freq: Once | ORAL | Status: DC

## 2013-08-24 MED ORDER — AMOXICILL-CLARITHRO-LANSOPRAZ PO MISC: ORAL | Status: DC

## 2013-08-24 MED ORDER — AMOXICILLIN 500 MG PO CAPS: 1000.0000 mg | ORAL_CAPSULE | Freq: Two times a day (BID) | ORAL | Status: DC

## 2013-08-24 MED ORDER — LANSOPRAZOLE 30 MG PO CPDR: 30.0000 mg | DELAYED_RELEASE_CAPSULE | Freq: Two times a day (BID) | ORAL | Status: DC

## 2013-08-24 NOTE — Telephone Encounter (Signed)
Discussed with patient and she voiced understanding. Rx sent and ref put in. She has also agreed to hold the omeprazole.     KP

## 2013-08-24 NOTE — Telephone Encounter (Signed)
Message copied by Ewing Schlein on Mon Aug 24, 2013 10:55 AM ------      Message from: Rosalita Chessman      Created: Fri Aug 21, 2013  1:21 PM       + h pylori--  Prev pac x2--- refer to gi      Hold PPI for the 2 weeks on prev pac ------

## 2013-08-24 NOTE — Telephone Encounter (Signed)
Call from [facility] is too expensive at 1000 dollars. They want to get an alternative per Dr.Lowne send Prevacid 30 mg bid, amox 500 mg 2 bid and clarithromycin 500 bid x's 2 weeks. Med's sent      KP

## 2013-08-28 ENCOUNTER — Telehealth: Payer: Self-pay

## 2013-08-28 NOTE — Telephone Encounter (Signed)
Spoke with patient and she stated that she called 2 days ago for clarification on her medication and has not been able to get that information, she said she went to pick up the med's and the pharmacist die not fill the Prevacid, he told her to instead double up on the Prilosec. I verified this with Dr.Lowne and she said that was fine for the patient to take the Prilosec.  The patient voiced understanding.     KP

## 2013-09-29 ENCOUNTER — Telehealth: Payer: Self-pay | Admitting: Family Medicine

## 2013-09-29 DIAGNOSIS — F411 Generalized anxiety disorder: Secondary | ICD-10-CM

## 2013-09-29 MED ORDER — CITALOPRAM HYDROBROMIDE 20 MG PO TABS: 10.0000 mg | ORAL_TABLET | Freq: Every day | ORAL | Status: DC

## 2013-09-29 NOTE — Telephone Encounter (Signed)
Patient has been made aware that the Rx was sent.      KP

## 2013-09-29 NOTE — Telephone Encounter (Signed)
Patient called to request a refill for citalopram (CELEXA) 10 MG tablet but her pharmacy tech told her its much cheaper for her to up the dosage to 20 mg and just break it in half. Please advise  Pharmacy [facility]

## 2013-09-29 NOTE — Telephone Encounter (Signed)
Please advise      KP 

## 2013-09-29 NOTE — Telephone Encounter (Signed)
celexa 20 mg  1/2 po qd  #45  3 refills

## 2013-10-21 ENCOUNTER — Telehealth: Payer: Self-pay | Admitting: Family Medicine

## 2013-10-21 MED ORDER — FLUCONAZOLE 150 MG PO TABS: 150.0000 mg | ORAL_TABLET | Freq: Once | ORAL | Status: DC

## 2013-10-21 NOTE — Telephone Encounter (Signed)
Caller name:Felicia Solis Relation to TF:[id number] Call back [phone] Pharmacy:[id number], [place]   Reason for call: to request a refill for Diflucan

## 2013-10-21 NOTE — Telephone Encounter (Signed)
Rx sent      KP 

## 2013-10-22 ENCOUNTER — Telehealth: Payer: Self-pay | Admitting: Family Medicine

## 2013-10-22 NOTE — Telephone Encounter (Signed)
Pt called to see the status of Diflucan.  Informed pt that pharmacy has confirmed receipt of medication and for them to contact pharmacy.

## 2013-10-30 ENCOUNTER — Ambulatory Visit: Payer: Self-pay | Admitting: Family Medicine

## 2013-10-30 DIAGNOSIS — Z0289 Encounter for other administrative examinations: Secondary | ICD-10-CM

## 2014-04-05 ENCOUNTER — Telehealth: Payer: Self-pay | Admitting: Family Medicine

## 2014-04-05 NOTE — Telephone Encounter (Signed)
Patient Information:  Caller Name: Eltha  Phone: [phone]  Patient: Felicia Solis  Gender: Female  DOB: 03/04/1966  Age: 48 Years  PCP: Rosalita Chessman.  Pregnant: No  Office Follow Up:  Does the office need to follow up with this patient?: No  Instructions For The Office: N/A   Symptoms  Reason For Call & Symptoms: L shoulder and neck pain over the weekend that was helped some with Tylenol. 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 Sx worse after shift on Saturday night. She has been taking Tylenol  ES 2 tabs prn every 6 hours and Goodies powder. She had bad headache on Saturday and BP= 137/91. She took Tylenol 2 tabs PO and didn't help. She had to take Goodies powder and lay down and headache eventually got better. Today she Woke up with pain and achying on L side of chest and it has continued all day. Family hx of HTN and diabetes. Denies Nausea or sweating. Unsure of BP. She is not wanting to call 911 but will go get checked at [facility].  Reviewed Health History In EMR: Yes  Reviewed Medications In EMR: Yes  Reviewed Allergies In EMR: Yes  Reviewed Surgeries / Procedures: Yes  Date of Onset of Symptoms: 04/03/2014  Treatments Tried: Tylenol  ES 2 tabs prn and Goodies powder.  Treatments Tried Worked: No OB / GYN:  LMP: Unknown  Guideline(s) Used:  Chest Pain  Disposition Per Guideline:   Call EMS 911 Now  Reason For Disposition Reached:   Chest pain lasting longer than 5 minutes and ANY of the following:  Over 20 years old Over 71 years old and at least one cardiac risk factor (i.e., high blood pressure, diabetes, high cholesterol, obesity, smoker or strong family history of heart disease) Pain is crushing, pressure-like, or heavy  Took nitroglycerin and chest pain was not relieved History of heart disease (i.e., angina, heart attack, bypass surgery, angioplasty, CHF)  Advice Given:  Expected Course:  These mild chest pains  usually disappear within 3 days.  Call Back If:  Constant chest pain lasting longer than 5 minutes  Difficulty breathing  You become worse.  Patient Will Follow Care Advice:  YES

## 2014-06-22 ENCOUNTER — Emergency Department (HOSPITAL_COMMUNITY): Payer: Self-pay

## 2014-06-22 ENCOUNTER — Emergency Department (HOSPITAL_COMMUNITY)
Admission: EM | Admit: 2014-06-22 | Discharge: 2014-06-23 | Disposition: A | Discharge status: Home / Self Care | Payer: Self-pay | Attending: Emergency Medicine | Admitting: Emergency Medicine

## 2014-06-22 ENCOUNTER — Encounter (HOSPITAL_COMMUNITY): Payer: Self-pay | Admitting: Emergency Medicine

## 2014-06-22 DIAGNOSIS — Z9851 Tubal ligation status: Secondary | ICD-10-CM | POA: Insufficient documentation

## 2014-06-22 DIAGNOSIS — R1013 Epigastric pain: Secondary | ICD-10-CM | POA: Insufficient documentation

## 2014-06-22 DIAGNOSIS — R079 Chest pain, unspecified: Secondary | ICD-10-CM

## 2014-06-22 DIAGNOSIS — Z79899 Other long term (current) drug therapy: Secondary | ICD-10-CM | POA: Insufficient documentation

## 2014-06-22 DIAGNOSIS — F329 Major depressive disorder, single episode, unspecified: Secondary | ICD-10-CM | POA: Insufficient documentation

## 2014-06-22 DIAGNOSIS — R0789 Other chest pain: Secondary | ICD-10-CM

## 2014-06-22 DIAGNOSIS — E669 Obesity, unspecified: Secondary | ICD-10-CM | POA: Insufficient documentation

## 2014-06-22 DIAGNOSIS — Z8611 Personal history of tuberculosis: Secondary | ICD-10-CM | POA: Insufficient documentation

## 2014-06-22 DIAGNOSIS — Z87448 Personal history of other diseases of urinary system: Secondary | ICD-10-CM | POA: Insufficient documentation

## 2014-06-22 DIAGNOSIS — D649 Anemia, unspecified: Secondary | ICD-10-CM | POA: Insufficient documentation

## 2014-06-22 DIAGNOSIS — I209 Angina pectoris, unspecified: Secondary | ICD-10-CM | POA: Insufficient documentation

## 2014-06-22 DIAGNOSIS — J45909 Unspecified asthma, uncomplicated: Secondary | ICD-10-CM | POA: Insufficient documentation

## 2014-06-22 DIAGNOSIS — Z87442 Personal history of urinary calculi: Secondary | ICD-10-CM | POA: Insufficient documentation

## 2014-06-22 LAB — BASIC METABOLIC PANEL
Anion gap: 5 (ref 5–15)
BUN: 12 mg/dL (ref 6–23)
CHLORIDE: 107 meq/L (ref 96–112)
CO2: 27 mmol/L (ref 19–32)
Calcium: 9.7 mg/dL (ref 8.4–10.5)
Creatinine, Ser: 0.46 mg/dL — ABNORMAL LOW (ref 0.50–1.10)
GFR calc Af Amer: >90 mL/min (ref >90)
GFR calc non Af Amer: >90 mL/min (ref >90)
Glucose, Bld: 122 mg/dL — ABNORMAL HIGH (ref 70–99)
Potassium: 3.7 mmol/L (ref 3.5–5.1)
Sodium: 139 mmol/L (ref 135–145)

## 2014-06-22 LAB — CBC
HEMATOCRIT: 39.8 % (ref 36.0–46.0)
Hemoglobin: 12.8 g/dL (ref 12.0–15.0)
MCH: 27.5 pg (ref 26.0–34.0)
MCHC: 32.2 g/dL (ref 30.0–36.0)
MCV: 85.6 fL (ref 78.0–100.0)
PLATELETS: 312 10*3/uL (ref 150–400)
RBC: 4.65 MIL/uL (ref 3.87–5.11)
RDW: 13.2 % (ref 11.5–15.5)
WBC: 8.5 10*3/uL (ref 4.0–10.5)

## 2014-06-22 LAB — I-STAT TROPONIN, ED: TROPONIN I, POC: 0 ng/mL (ref 0.00–0.08)

## 2014-06-22 MED ORDER — FAMOTIDINE 20 MG PO TABS
20.0000 mg | ORAL_TABLET | Freq: Once | ORAL | Status: AC
  Administered 2014-06-22: 20 mg via ORAL
  Filled 2014-06-22: qty 1

## 2014-06-22 NOTE — Discharge Instructions (Signed)
Chest Pain (Nonspecific) It is often hard to give a diagnosis for the cause of chest pain. There is always a chance that your pain could be related to something serious, such as a heart attack or a blood clot in the lungs. You need to follow up with your doctor. HOME CARE  If antibiotic medicine was given, take it as directed by your doctor. Finish the medicine even if you start to feel better.  For the next few days, avoid activities that bring on chest pain. Continue physical activities as told by your doctor.  Do not use any tobacco products. This includes cigarettes, chewing tobacco, and e-cigarettes.  Avoid drinking alcohol.  Only take medicine as told by your doctor.  Follow your doctor's suggestions for more testing if your chest pain does not go away.  Keep all doctor visits you made. GET HELP IF:  Your chest pain does not go away, even after treatment.  You have a rash with blisters on your chest.  You have a fever. GET HELP RIGHT AWAY IF:   You have more pain or pain that spreads to your arm, neck, jaw, back, or belly (abdomen).  You have shortness of breath.  You cough more than usual or cough up blood.  You have very bad back or belly pain.  You feel sick to your stomach (nauseous) or throw up (vomit).  You have very bad weakness.  You pass out (faint).  You have chills. This is an emergency. Do not wait to see if the problems will go away. Call your local emergency services (911 in [place] Do not drive yourself to the hospital. MAKE SURE YOU:   Understand these instructions.  Will watch your condition.  Will get help right away if you are not doing well or get worse. Document Released: 11/28/2007 Document Revised: 06/16/2013 Document Reviewed: 11/28/2007 [facility] Patient Information 2015 [place], [place]. This information is not intended to replace advice given to you by your health care provider. Make sure you discuss any questions you have with your  health care provider.   It is appropriate fetal follow-up with your primary care for further evaluation and management of your symptoms. Return to ED for worsening symptoms.

## 2014-06-22 NOTE — ED Notes (Signed)
Patient sitting on side of bed talking with family.

## 2014-06-22 NOTE — ED Notes (Signed)
Per pt, states chest pain, left arm numbness, legs and back hurt, has not followed up with PCP for complaints

## 2014-06-22 NOTE — ED Provider Notes (Signed)
CSN: [id number]     Arrival date & time 06/22/14  1707 History   First MD Initiated Contact with Patient 06/22/14 2131     Chief Complaint  Patient presents with  . Chest Pain  . multiple complaints      (Consider location/radiation/quality/duration/timing/severity/associated sxs/prior Treatment) HPI.Felicia Solis is a 48 y.o. female with a history of esophageal reflux comes in for evaluation of chest discomfort. Patient states last night at 8 PM while sitting in bed watching TV patient felt a dull ache under her left breast. She reports that pain was intermittent and got worse today. The pain does not radiate anywhere. She rates the pain as a 2/10 right now. She did not take anything for her symptoms. She denies any associated nausea, vomiting, diaphoresis, numbness or weakness. No headaches, vision changes, shortness of breath, abdominal pain, dysuria or hematuria. She denies any recent travel, surgeries, unilateral leg swelling, hemoptysis, history of blood clot, oral contraception. There are no other aggravating or relieving factors. Patient is nonsmoker She has not currently taking medications for her reflux. Reports she did not go to her primary care because she knew what she explained her symptoms her PCP would tell her to go to the ED.  Past Medical History  Diagnosis Date  . Angina     pt states she has occasional chest pains no other sxs.. seen in ed  at  [facility] .. all tsts negative.   . Asthma 5 yrs ago     told she had asthma.. does not remember doctors name.   . Shortness of breath     with exertion  . Tuberculosis     pt states she took 9 months of medicine for positive tb skin test.   . Anemia     anemia  when pregnant.  10 yrs ago.   Marland Kitchen Headache(784.0)     years ago told h/a s were migraines.   . Depression     takes citalopram  . Renal disorder     kidney stones   Past Surgical History  Procedure Laterality Date  . Endometrial ablation    . Tubal ligation     . Laparoscopy      years ago ..pt does not know where surgery was done ... states" my stomach would blow up then go down"  . Dilation and curettage of uterus  1999  . Thyroidectomy  09/28/2011    Procedure: THYROIDECTOMY;  Surgeon: Izora Gala, MD;  Location: [place];  Service: ENT;  Laterality: Right;  RIGHT THYROIDECTOMY WITH FROZEN SECTION   Family History  Problem Relation Age of Onset  . Diabetes Mother   . Hypertension Mother   . Hyperlipidemia Mother   . Diabetes Sister   . Hypertension Father   . Hyperlipidemia Father   . Ovarian cancer Maternal Grandmother   . Colon cancer Paternal Grandfather   . Breast cancer Maternal Aunt   . Anesthesia problems Neg Hx   . Hypotension Neg Hx   . Malignant hyperthermia Neg Hx   . Pseudochol deficiency Neg Hx   . Heart disease Other     early cad   History  Substance Use Topics  . Smoking status: Never Smoker   . Smokeless tobacco: Never Used  . Alcohol Use: No   OB History    No data available     Review of Systems A 10 point review of systems was completed and was negative except for pertinent positives and negatives as  mentioned in the history of present illness     Allergies  Review of patient's allergies indicates no known allergies.  Home Medications   Prior to Admission medications   Medication Sig Start Date End Date Taking? Authorizing Provider  acetaminophen (TYLENOL) 500 MG tablet Take 1,000 mg by mouth every 6 (six) hours as needed for mild pain.   Yes Historical Provider, MD  Aspirin-Acetaminophen-Caffeine (GOODYS EXTRA STRENGTH) [phone] MG PACK Take 1 packet by mouth every 6 (six) hours as needed. For pain.   Yes Historical Provider, MD  Multiple Vitamin (MULTIVITAMIN WITH MINERALS) TABS tablet Take 1 tablet by mouth daily.   Yes Historical Provider, MD  amoxicillin (AMOXIL) 500 MG capsule Take 2 capsules (1,000 mg total) by mouth 2 (two) times daily. Patient not taking: Reported on 06/22/2014 08/24/13   Rosalita Chessman, DO  citalopram (CELEXA) 20 MG tablet Take 0.5 tablets (10 mg total) by mouth daily. Patient not taking: Reported on 06/22/2014 09/29/13   Rosalita Chessman, DO  clarithromycin (BIAXIN) 500 MG tablet Take 1 tablet (500 mg total) by mouth 2 (two) times daily. Patient not taking: Reported on 06/22/2014 08/24/13   Rosalita Chessman, DO  fluconazole (DIFLUCAN) 150 MG tablet Take 1 tablet (150 mg total) by mouth once. Patient not taking: Reported on 06/22/2014 10/21/13   Rosalita Chessman, DO  omeprazole (PRILOSEC) 40 MG capsule Take 1 capsule (40 mg total) by mouth daily. Patient not taking: Reported on 06/22/2014 08/19/13   Alferd Apa Lowne, DO   BP 130/79 mmHg  Pulse 98  Temp(Src) 98 F (36.7 C) (Oral)  Resp 18  SpO2 96% Physical Exam  Constitutional: She is oriented to person, place, and time. She appears well-developed and well-nourished.  Obese  HENT:  Head: Normocephalic and atraumatic.  Mouth/Throat: Oropharynx is clear and moist.  Eyes: Conjunctivae are normal. Pupils are equal, round, and reactive to light. Right eye exhibits no discharge. Left eye exhibits no discharge. No scleral icterus.  Neck: Neck supple.  Cardiovascular: Normal rate, regular rhythm and normal heart sounds.   Pulmonary/Chest: Effort normal and breath sounds normal. No respiratory distress. She has no wheezes. She has no rales.  Abdominal: Soft.  Epigastrium palpation reproduces discomfort. Abdomen otherwise nontender, nondistended. No rebound or guarding. No lesions, rashes, deformities or palpable masses.  Musculoskeletal: She exhibits no tenderness.  Neurological: She is alert and oriented to person, place, and time.  Cranial Nerves II-XII grossly intact  Skin: Skin is warm and dry. No rash noted.  Psychiatric: She has a normal mood and affect.  Nursing note and vitals reviewed.   ED Course  Procedures (including critical care time) Labs Review Labs Reviewed  BASIC METABOLIC PANEL - Abnormal; Notable for  the following:    Glucose, Bld 122 (*)    Creatinine, Ser 0.46 (*)    All other components within normal limits  CBC  I-STAT TROPOININ, ED    Imaging Review Dg Chest 2 View  06/22/2014   CLINICAL DATA:  Chest pain  EXAM: CHEST  2 VIEW  COMPARISON:  09/27/2011  FINDINGS: Lungs are clear.  No pleural effusion or pneumothorax.  The heart is normal in size.  Mild degenerative changes of the visualized thoracolumbar spine.  IMPRESSION: No evidence of acute cardiopulmonary disease.   Electronically Signed   By: Julian Hy M.D.   On: 06/22/2014 18:18     EKG Interpretation   Date/Time:  Tuesday June 22 2014 17:14:36 EST Ventricular Rate:  80  PR Interval:  155 QRS Duration: 91 QT Interval:  386 QTC Calculation: 445 R Axis:   83 Text Interpretation:  Sinus rhythm Low voltage, precordial leads  Borderline T abnormalities, anterior leads Confirmed by COOK  MD, BRIAN  [phone]) on 06/22/2014 5:18:38 PM     Meds given in ED:  Medications  famotidine (PEPCID) tablet 20 mg (20 mg Oral Given 06/22/14 2305)    New Prescriptions   No medications on file   Filed Vitals:   06/22/14 2143  BP: 130/79  Pulse: 98  Temp: 98 F (36.7 C)  Resp: 18   MDM   Felicia Solis is a 48 y.o. female who presents for dull aching chest discomfort since 8pm last night that has been intermittent. Discomfort is non exertional and there are no aggravating or relieving factors.  Vitals stable - WNL -afebrile Pt resting comfortably in ED. Refuses GI cocktail. Pepcid improved discomfort. PE--Palpation of epigasrtum reproduces discomfort. no evidence of other acute or emergent pathology. Labwork noncontributory. Troponin negative, EKG not concerning. Imaging--chest x-ray shows no acute cardio pulmonary pathology DDX--patient is perc negative. Low heart score low risk of MACE. Low concern for ACS, dissection, pneumothorax, pulmonary embolism, esophageal rupture. Patient is likely experiencing symptoms  of GERD.  Discussed f/u with PCP and return precautions, pt very amenable to plan. Pt stable, in good condition and is appropriate for discharge.   Final diagnoses:  Chest pain        Verl Dicker, PA-C 06/23/14 Ginger Blue, MD 06/28/14 [phone]

## 2014-07-22 ENCOUNTER — Other Ambulatory Visit: Payer: Self-pay

## 2014-07-22 DIAGNOSIS — Z1231 Encounter for screening mammogram for malignant neoplasm of breast: Secondary | ICD-10-CM

## 2014-08-05 ENCOUNTER — Telehealth: Payer: Self-pay | Admitting: *Deleted

## 2014-08-05 NOTE — Telephone Encounter (Signed)
Pre-Visit Call:   Pap- 08/27/11- with Dr. Etter Sjogren, abnormal- went to GYN Crawford Givens, MD  Mmg- 11/14/13 with Enrique Sack, MD at [facility]: BI-RADS CATEGORY 1: Neg  Flu- unknown Td- 08/26/05 (pateint reported)  Patient unavailable at time of call.

## 2014-08-06 ENCOUNTER — Other Ambulatory Visit (HOSPITAL_COMMUNITY)
Admission: RE | Admit: 2014-08-06 | Discharge: 2014-08-06 | Disposition: A | Discharge status: Home / Self Care | Payer: Self-pay | Source: Ambulatory Visit | Attending: Family Medicine | Admitting: Family Medicine

## 2014-08-06 ENCOUNTER — Ambulatory Visit (INDEPENDENT_AMBULATORY_CARE_PROVIDER_SITE_OTHER): Payer: Self-pay | Admitting: Family Medicine

## 2014-08-06 ENCOUNTER — Encounter: Payer: Self-pay | Admitting: Family Medicine

## 2014-08-06 ENCOUNTER — Ambulatory Visit (HOSPITAL_BASED_OUTPATIENT_CLINIC_OR_DEPARTMENT_OTHER)
Admission: RE | Admit: 2014-08-06 | Discharge: 2014-08-06 | Disposition: A | Discharge status: Home / Self Care | Payer: Self-pay | Source: Ambulatory Visit | Attending: Family Medicine | Admitting: Family Medicine

## 2014-08-06 VITALS — BP 121/77 | HR 88 | Temp 97.9°F | Ht 66.0 in | Wt 238.8 lb

## 2014-08-06 DIAGNOSIS — K219 Gastro-esophageal reflux disease without esophagitis: Secondary | ICD-10-CM

## 2014-08-06 DIAGNOSIS — M5441 Lumbago with sciatica, right side: Secondary | ICD-10-CM

## 2014-08-06 DIAGNOSIS — F418 Other specified anxiety disorders: Secondary | ICD-10-CM

## 2014-08-06 DIAGNOSIS — Z124 Encounter for screening for malignant neoplasm of cervix: Secondary | ICD-10-CM

## 2014-08-06 DIAGNOSIS — Z0000 Encounter for general adult medical examination without abnormal findings: Secondary | ICD-10-CM

## 2014-08-06 DIAGNOSIS — R739 Hyperglycemia, unspecified: Secondary | ICD-10-CM

## 2014-08-06 DIAGNOSIS — M545 Low back pain: Secondary | ICD-10-CM | POA: Insufficient documentation

## 2014-08-06 DIAGNOSIS — Z01419 Encounter for gynecological examination (general) (routine) without abnormal findings: Secondary | ICD-10-CM | POA: Insufficient documentation

## 2014-08-06 DIAGNOSIS — Z1151 Encounter for screening for human papillomavirus (HPV): Secondary | ICD-10-CM | POA: Insufficient documentation

## 2014-08-06 LAB — BASIC METABOLIC PANEL
BUN: 9 mg/dL (ref 6–23)
CHLORIDE: 103 meq/L (ref 96–112)
CO2: 24 meq/L (ref 19–32)
CREATININE: 0.54 mg/dL (ref 0.50–1.10)
Calcium: 10.4 mg/dL (ref 8.4–10.5)
Glucose, Bld: 179 mg/dL — ABNORMAL HIGH (ref 70–99)
Potassium: 4.2 mEq/L (ref 3.5–5.3)
Sodium: 139 mEq/L (ref 135–145)

## 2014-08-06 LAB — HEPATIC FUNCTION PANEL
ALBUMIN: 4.5 g/dL (ref 3.5–5.2)
ALT: 22 U/L (ref 0–35)
AST: 19 U/L (ref 0–37)
Alkaline Phosphatase: 91 U/L (ref 39–117)
BILIRUBIN INDIRECT: 0.3 mg/dL (ref 0.2–1.2)
Bilirubin, Direct: 0.1 mg/dL (ref 0.0–0.3)
Total Bilirubin: 0.4 mg/dL (ref 0.2–1.2)
Total Protein: 7.8 g/dL (ref 6.0–8.3)

## 2014-08-06 LAB — LIPID PANEL
CHOLESTEROL: 173 mg/dL (ref 0–200)
HDL: 43 mg/dL (ref >39)
LDL Cholesterol: 95 mg/dL (ref 0–99)
Total CHOL/HDL Ratio: 4 Ratio
Triglycerides: 176 mg/dL — ABNORMAL HIGH (ref <150)
VLDL: 35 mg/dL (ref 0–40)

## 2014-08-06 LAB — CBC WITH DIFFERENTIAL/PLATELET
BASOS ABS: 0 10*3/uL (ref 0.0–0.1)
Basophils Relative: 0 % (ref 0–1)
Eosinophils Absolute: 0.1 10*3/uL (ref 0.0–0.7)
Eosinophils Relative: 1 % (ref 0–5)
HEMATOCRIT: 41.9 % (ref 36.0–46.0)
Hemoglobin: 13.8 g/dL (ref 12.0–15.0)
LYMPHS PCT: 43 % (ref 12–46)
Lymphs Abs: 4.3 10*3/uL — ABNORMAL HIGH (ref 0.7–4.0)
MCH: 27.8 pg (ref 26.0–34.0)
MCHC: 32.9 g/dL (ref 30.0–36.0)
MCV: 84.5 fL (ref 78.0–100.0)
MONO ABS: 0.7 10*3/uL (ref 0.1–1.0)
MPV: 11 fL (ref 8.6–12.4)
Monocytes Relative: 7 % (ref 3–12)
NEUTROS ABS: 4.9 10*3/uL (ref 1.7–7.7)
NEUTROS PCT: 49 % (ref 43–77)
Platelets: 343 10*3/uL (ref 150–400)
RBC: 4.96 MIL/uL (ref 3.87–5.11)
RDW: 13.5 % (ref 11.5–15.5)
WBC: 10 10*3/uL (ref 4.0–10.5)

## 2014-08-06 LAB — TSH: TSH: 0.936 u[IU]/mL (ref 0.350–4.500)

## 2014-08-06 MED ORDER — PANTOPRAZOLE SODIUM 40 MG PO TBEC: 40.0000 mg | DELAYED_RELEASE_TABLET | Freq: Every day | ORAL | Status: DC

## 2014-08-06 MED ORDER — CITALOPRAM HYDROBROMIDE 10 MG PO TABS: ORAL_TABLET | ORAL | Status: DC

## 2014-08-06 MED ORDER — CYCLOBENZAPRINE HCL 10 MG PO TABS: 10.0000 mg | ORAL_TABLET | Freq: Three times a day (TID) | ORAL | Status: DC | PRN

## 2014-08-06 NOTE — Progress Notes (Signed)
Subjective:     Felicia Solis is a 49 y.o. female and is here for a comprehensive physical exam. The patient reports no problems.  History   Social History  . Marital Status: Married    Spouse Name: N/A  . Number of Children: N/A  . Years of Education: N/A   Occupational History  . [employer]    Social History Main Topics  . Smoking status: Never Smoker   . Smokeless tobacco: Never Used  . Alcohol Use: No  . Drug Use: No  . Sexual Activity:    Partners: Male   Other Topics Concern  . Not on file   Social History Narrative   Health Maintenance  Topic Date Due  . INFLUENZA VACCINE  08/07/2015 (Originally 01/23/2014)  . MAMMOGRAM  08/17/2014  . PAP SMEAR  08/27/2014  . TETANUS/TDAP  08/27/2015    The following portions of the patient's history were reviewed and updated as appropriate:  She  has a past medical history of Angina; Asthma (5 yrs ago ); Shortness of breath; Tuberculosis; Anemia; Headache(784.0); Depression; and Renal disorder. She  does not have any pertinent problems on file. She  has past surgical history that includes Endometrial ablation; Tubal ligation; laparoscopy; Dilation and curettage of uterus (1999); and Thyroidectomy (09/28/2011). Her family history includes Breast cancer in her maternal aunt; Colon cancer in her paternal grandfather; Diabetes in her mother and sister; Heart disease in her other; Hyperlipidemia in her father and mother; Hypertension in her father and mother; Ovarian cancer in her maternal grandmother. There is no history of Anesthesia problems, Hypotension, Malignant hyperthermia, or Pseudochol deficiency. She  reports that she has never smoked. She has never used smokeless tobacco. She reports that she does not drink alcohol or use illicit drugs. She has a current medication list which includes the following prescription(s): acetaminophen, aspirin-acetaminophen-caffeine, multivitamin with minerals, citalopram, and  omeprazole. Current Outpatient Prescriptions on File Prior to Visit  Medication Sig Dispense Refill  . acetaminophen (TYLENOL) 500 MG tablet Take 1,000 mg by mouth every 6 (six) hours as needed for mild pain.    . Aspirin-Acetaminophen-Caffeine (GOODYS EXTRA STRENGTH) 500-325-65 MG PACK Take 1 packet by mouth every 6 (six) hours as needed. For pain.    . Multiple Vitamin (MULTIVITAMIN WITH MINERALS) TABS tablet Take 1 tablet by mouth daily.    . citalopram (CELEXA) 20 MG tablet Take 0.5 tablets (10 mg total) by mouth daily. (Patient not taking: Reported on 06/22/2014) 45 tablet 3  . omeprazole (PRILOSEC) 40 MG capsule Take 1 capsule (40 mg total) by mouth daily. (Patient not taking: Reported on 06/22/2014) 30 capsule 3   No current facility-administered medications on file prior to visit.   She has No Known Allergies..  Review of Systems Review of Systems  Constitutional: Negative for activity change, appetite change and fatigue.  HENT: Negative for hearing loss, congestion, tinnitus and ear discharge.  dentist--no Eyes: Negative for visual disturbance (see optho q2y -- vision corrected to 20/20 with glasses).  Respiratory: Negative for cough, chest tightness and shortness of breath.   Cardiovascular: Negative for chest pain, palpitations and leg swelling.  Gastrointestinal: Negative for abdominal pain, diarrhea, constipation and abdominal distention.  Genitourinary: Negative for urgency, frequency, decreased urine volume and difficulty urinating.  Musculoskeletal: Negative for back pain, arthralgias and gait problem.  Skin: Negative for color change, pallor and rash.  Neurological: Negative for dizziness, light-headedness, numbness and headaches.  Hematological: Negative for adenopathy. Does not bruise/bleed easily.  Psychiatric/Behavioral:  Negative for suicidal ideas, confusion, sleep disturbance, self-injury, dysphoric mood, decreased concentration and agitation.        Objective:     BP 121/77 mmHg  Pulse 88  Temp(Src) 97.9 F (36.6 C) (Oral)  Ht 5\' 6"  (1.676 m)  Wt 238 lb 12.8 oz (108.319 kg)  BMI 38.56 kg/m2  SpO2 99% General appearance: alert, cooperative, appears stated age and no distress Head: Normocephalic, without obvious abnormality, atraumatic Eyes: conjunctivae/corneas clear. PERRL, EOM's intact. Fundi benign. Ears: normal TM's and external ear canals both ears Nose: Nares normal. Septum midline. Mucosa normal. No drainage or sinus tenderness. Throat: lips, mucosa, and tongue normal; teeth and gums normal Neck: no adenopathy, no carotid bruit, no JVD, supple, symmetrical, trachea midline and thyroid not enlarged, symmetric, no tenderness/mass/nodules Back: symmetric, no curvature. ROM normal. No CVA tenderness. Lungs: clear to auscultation bilaterally Breasts: normal appearance, no masses or tenderness Heart: regular rate and rhythm, S1, S2 normal, no murmur, click, rub or gallop Abdomen: soft, non-tender; bowel sounds normal; no masses,  no organomegaly Pelvic: cervix normal in appearance, external genitalia normal, no adnexal masses or tenderness, no cervical motion tenderness, rectovaginal septum normal, uterus normal size, shape, and consistency, vagina normal without discharge and pap done, rectal heme neg brown stool Extremities: extremities normal, atraumatic, no cyanosis or edema Pulses: 2+ and symmetric Skin: Skin color, texture, turgor normal. No rashes or lesions Lymph nodes: Cervical, supraclavicular, and axillary nodes normal. Neurologic: Alert and oriented X 3, normal strength and tone. Normal symmetric reflexes. Normal coordination and gait Psych--no depression, no anxiety      Assessment:    Healthy female exam.      Plan:    ghm utd Check labs See After Visit Summary for Counseling Recommendations    1. Preventative health care   - Basic metabolic panel - CBC with Differential/Platelet - Hepatic function panel - Lipid  panel - POCT urinalysis dipstick - TSH - Ambulatory referral to Gastroenterology  2. Gastroesophageal reflux disease without esophagitis Change omeprazole Refer to gi - pantoprazole (PROTONIX) 40 MG tablet; Take 1 tablet (40 mg total) by mouth daily.  Dispense: 90 tablet; Refill: 3 - Ambulatory referral to Gastroenterology  3. Depression with anxiety Restart celexa at 1/2 dose - citalopram (CELEXA) 10 MG tablet; 1 po qd  Dispense: 30 tablet; Refill: 2  4. Right-sided low back pain with right-sided sciatica   - cyclobenzaprine (FLEXERIL) 10 MG tablet; Take 1 tablet (10 mg total) by mouth 3 (three) times daily as needed for muscle spasms.  Dispense: 30 tablet; Refill: 0 - DG Lumbar Spine 2-3 Views; Future  5. Screening for cervical cancer   - Cytology - PAP

## 2014-08-06 NOTE — Progress Notes (Signed)
Pre visit review using our clinic review tool, if applicable. No additional management support is needed unless otherwise documented below in the visit note. 

## 2014-08-06 NOTE — Patient Instructions (Signed)
Preventive Care for Adults A healthy lifestyle and preventive care can promote health and wellness. Preventive health guidelines for women include the following key practices.  A routine yearly physical is a good way to check with your health care provider about your health and preventive screening. It is a chance to share any concerns and updates on your health and to receive a thorough exam.  Visit your dentist for a routine exam and preventive care every 6 months. Brush your teeth twice a day and floss once a day. Good oral hygiene prevents tooth decay and gum disease.  The frequency of eye exams is based on your age, health, family medical history, use of contact lenses, and other factors. Follow your health care provider's recommendations for frequency of eye exams.  Eat a healthy diet. Foods like vegetables, fruits, whole grains, low-fat dairy products, and lean protein foods contain the nutrients you need without too many calories. Decrease your intake of foods high in solid fats, added sugars, and salt. Eat the right amount of calories for you.Get information about a proper diet from your health care provider, if necessary.  Regular physical exercise is one of the most important things you can do for your health. Most adults should get at least 150 minutes of moderate-intensity exercise (any activity that increases your heart rate and causes you to sweat) each week. In addition, most adults need muscle-strengthening exercises on 2 or more days a week.  Maintain a healthy weight. The body mass index (BMI) is a screening tool to identify possible weight problems. It provides an estimate of body fat based on height and weight. Your health care provider can find your BMI and can help you achieve or maintain a healthy weight.For adults 20 years and older:  A BMI below 18.5 is considered underweight.  A BMI of 18.5 to 24.9 is normal.  A BMI of 25 to 29.9 is considered overweight.  A BMI of  30 and above is considered obese.  Maintain normal blood lipids and cholesterol levels by exercising and minimizing your intake of saturated fat. Eat a balanced diet with plenty of fruit and vegetables. Blood tests for lipids and cholesterol should begin at age 76 and be repeated every 5 years. If your lipid or cholesterol levels are high, you are over 50, or you are at high risk for heart disease, you may need your cholesterol levels checked more frequently.Ongoing high lipid and cholesterol levels should be treated with medicines if diet and exercise are not working.  If you smoke, find out from your health care provider how to quit. If you do not use tobacco, do not start.  Lung cancer screening is recommended for adults aged 22-80 years who are at high risk for developing lung cancer because of a history of smoking. A yearly low-dose CT scan of the lungs is recommended for people who have at least a 30-pack-year history of smoking and are a current smoker or have quit within the past 15 years. A pack year of smoking is smoking an average of 1 pack of cigarettes a day for 1 year (for example: 1 pack a day for 30 years or 2 packs a day for 15 years). Yearly screening should continue until the smoker has stopped smoking for at least 15 years. Yearly screening should be stopped for people who develop a health problem that would prevent them from having lung cancer treatment.  If you are pregnant, do not drink alcohol. If you are breastfeeding,  be very cautious about drinking alcohol. If you are not pregnant and choose to drink alcohol, do not have more than 1 drink per day. One drink is considered to be 12 ounces (355 mL) of beer, 5 ounces (148 mL) of wine, or 1.5 ounces (44 mL) of liquor.  Avoid use of street drugs. Do not share needles with anyone. Ask for help if you need support or instructions about stopping the use of drugs.  High blood pressure causes heart disease and increases the risk of  stroke. Your blood pressure should be checked at least every 1 to 2 years. Ongoing high blood pressure should be treated with medicines if weight loss and exercise do not work.  If you are 3-86 years old, ask your health care provider if you should take aspirin to prevent strokes.  Diabetes screening involves taking a blood sample to check your fasting blood sugar level. This should be done once every 3 years, after age 67, if you are within normal weight and without risk factors for diabetes. Testing should be considered at a younger age or be carried out more frequently if you are overweight and have at least 1 risk factor for diabetes.  Breast cancer screening is essential preventive care for women. You should practice "breast self-awareness." This means understanding the normal appearance and feel of your breasts and may include breast self-examination. Any changes detected, no matter how small, should be reported to a health care provider. Women in their 8s and 30s should have a clinical breast exam (CBE) by a health care provider as part of a regular health exam every 1 to 3 years. After age 70, women should have a CBE every year. Starting at age 25, women should consider having a mammogram (breast X-ray test) every year. Women who have a family history of breast cancer should talk to their health care provider about genetic screening. Women at a high risk of breast cancer should talk to their health care providers about having an MRI and a mammogram every year.  Breast cancer gene (BRCA)-related cancer risk assessment is recommended for women who have family members with BRCA-related cancers. BRCA-related cancers include breast, ovarian, tubal, and peritoneal cancers. Having family members with these cancers may be associated with an increased risk for harmful changes (mutations) in the breast cancer genes BRCA1 and BRCA2. Results of the assessment will determine the need for genetic counseling and  BRCA1 and BRCA2 testing.  Routine pelvic exams to screen for cancer are no longer recommended for nonpregnant women who are considered low risk for cancer of the pelvic organs (ovaries, uterus, and vagina) and who do not have symptoms. Ask your health care provider if a screening pelvic exam is right for you.  If you have had past treatment for cervical cancer or a condition that could lead to cancer, you need Pap tests and screening for cancer for at least 20 years after your treatment. If Pap tests have been discontinued, your risk factors (such as having a new sexual partner) need to be reassessed to determine if screening should be resumed. Some women have medical problems that increase the chance of getting cervical cancer. In these cases, your health care provider may recommend more frequent screening and Pap tests.  The HPV test is an additional test that may be used for cervical cancer screening. The HPV test looks for the virus that can cause the cell changes on the cervix. The cells collected during the Pap test can be  tested for HPV. The HPV test could be used to screen women aged 30 years and older, and should be used in women of any age who have unclear Pap test results. After the age of 30, women should have HPV testing at the same frequency as a Pap test.  Colorectal cancer can be detected and often prevented. Most routine colorectal cancer screening begins at the age of 50 years and continues through age 75 years. However, your health care provider may recommend screening at an earlier age if you have risk factors for colon cancer. On a yearly basis, your health care provider may provide home test kits to check for hidden blood in the stool. Use of a small camera at the end of a tube, to directly examine the colon (sigmoidoscopy or colonoscopy), can detect the earliest forms of colorectal cancer. Talk to your health care provider about this at age 50, when routine screening begins. Direct  exam of the colon should be repeated every 5-10 years through age 75 years, unless early forms of pre-cancerous polyps or small growths are found.  People who are at an increased risk for hepatitis B should be screened for this virus. You are considered at high risk for hepatitis B if:  You were born in a country where hepatitis B occurs often. Talk with your health care provider about which countries are considered high risk.  Your parents were born in a high-risk country and you have not received a shot to protect against hepatitis B (hepatitis B vaccine).  You have HIV or AIDS.  You use needles to inject street drugs.  You live with, or have sex with, someone who has hepatitis B.  You get hemodialysis treatment.  You take certain medicines for conditions like cancer, organ transplantation, and autoimmune conditions.  Hepatitis C blood testing is recommended for all people born from 1945 through 1965 and any individual with known risks for hepatitis C.  Practice safe sex. Use condoms and avoid high-risk sexual practices to reduce the spread of sexually transmitted infections (STIs). STIs include gonorrhea, chlamydia, syphilis, trichomonas, herpes, HPV, and human immunodeficiency virus (HIV). Herpes, HIV, and HPV are viral illnesses that have no cure. They can result in disability, cancer, and death.  You should be screened for sexually transmitted illnesses (STIs) including gonorrhea and chlamydia if:  You are sexually active and are younger than 24 years.  You are older than 24 years and your health care provider tells you that you are at risk for this type of infection.  Your sexual activity has changed since you were last screened and you are at an increased risk for chlamydia or gonorrhea. Ask your health care provider if you are at risk.  If you are at risk of being infected with HIV, it is recommended that you take a prescription medicine daily to prevent HIV infection. This is  called preexposure prophylaxis (PrEP). You are considered at risk if:  You are a heterosexual woman, are sexually active, and are at increased risk for HIV infection.  You take drugs by injection.  You are sexually active with a partner who has HIV.  Talk with your health care provider about whether you are at high risk of being infected with HIV. If you choose to begin PrEP, you should first be tested for HIV. You should then be tested every 3 months for as long as you are taking PrEP.  Osteoporosis is a disease in which the bones lose minerals and strength   with aging. This can result in serious bone fractures or breaks. The risk of osteoporosis can be identified using a bone density scan. Women ages 65 years and over and women at risk for fractures or osteoporosis should discuss screening with their health care providers. Ask your health care provider whether you should take a calcium supplement or vitamin D to reduce the rate of osteoporosis.  Menopause can be associated with physical symptoms and risks. Hormone replacement therapy is available to decrease symptoms and risks. You should talk to your health care provider about whether hormone replacement therapy is right for you.  Use sunscreen. Apply sunscreen liberally and repeatedly throughout the day. You should seek shade when your shadow is shorter than you. Protect yourself by wearing long sleeves, pants, a wide-brimmed hat, and sunglasses year round, whenever you are outdoors.  Once a month, do a whole body skin exam, using a mirror to look at the skin on your back. Tell your health care provider of new moles, moles that have irregular borders, moles that are larger than a pencil eraser, or moles that have changed in shape or color.  Stay current with required vaccines (immunizations).  Influenza vaccine. All adults should be immunized every year.  Tetanus, diphtheria, and acellular pertussis (Td, Tdap) vaccine. Pregnant women should  receive 1 dose of Tdap vaccine during each pregnancy. The dose should be obtained regardless of the length of time since the last dose. Immunization is preferred during the 27th-36th week of gestation. An adult who has not previously received Tdap or who does not know her vaccine status should receive 1 dose of Tdap. This initial dose should be followed by tetanus and diphtheria toxoids (Td) booster doses every 10 years. Adults with an unknown or incomplete history of completing a 3-dose immunization series with Td-containing vaccines should begin or complete a primary immunization series including a Tdap dose. Adults should receive a Td booster every 10 years.  Varicella vaccine. An adult without evidence of immunity to varicella should receive 2 doses or a second dose if she has previously received 1 dose. Pregnant females who do not have evidence of immunity should receive the first dose after pregnancy. This first dose should be obtained before leaving the health care facility. The second dose should be obtained 4-8 weeks after the first dose.  Human papillomavirus (HPV) vaccine. Females aged 13-26 years who have not received the vaccine previously should obtain the 3-dose series. The vaccine is not recommended for use in pregnant females. However, pregnancy testing is not needed before receiving a dose. If a female is found to be pregnant after receiving a dose, no treatment is needed. In that case, the remaining doses should be delayed until after the pregnancy. Immunization is recommended for any person with an immunocompromised condition through the age of 26 years if she did not get any or all doses earlier. During the 3-dose series, the second dose should be obtained 4-8 weeks after the first dose. The third dose should be obtained 24 weeks after the first dose and 16 weeks after the second dose.  Zoster vaccine. One dose is recommended for adults aged 60 years or older unless certain conditions are  present.  Measles, mumps, and rubella (MMR) vaccine. Adults born before 1957 generally are considered immune to measles and mumps. Adults born in 1957 or later should have 1 or more doses of MMR vaccine unless there is a contraindication to the vaccine or there is laboratory evidence of immunity to   each of the three diseases. A routine second dose of MMR vaccine should be obtained at least 28 days after the first dose for students attending postsecondary schools, health care workers, or international travelers. People who received inactivated measles vaccine or an unknown type of measles vaccine during 1963-1967 should receive 2 doses of MMR vaccine. People who received inactivated mumps vaccine or an unknown type of mumps vaccine before 1979 and are at high risk for mumps infection should consider immunization with 2 doses of MMR vaccine. For females of childbearing age, rubella immunity should be determined. If there is no evidence of immunity, females who are not pregnant should be vaccinated. If there is no evidence of immunity, females who are pregnant should delay immunization until after pregnancy. Unvaccinated health care workers born before [birth year] who lack laboratory evidence of measles, mumps, or rubella immunity or laboratory confirmation of disease should consider measles and mumps immunization with 2 doses of MMR vaccine or rubella immunization with 1 dose of MMR vaccine.  Pneumococcal 13-valent conjugate (PCV13) vaccine. When indicated, a person who is uncertain of her immunization history and has no record of immunization should receive the PCV13 vaccine. An adult aged 39 years or older who has certain medical conditions and has not been previously immunized should receive 1 dose of PCV13 vaccine. This PCV13 should be followed with a dose of pneumococcal polysaccharide (PPSV23) vaccine. The PPSV23 vaccine dose should be obtained at least 8 weeks after the dose of PCV13 vaccine. An adult aged 68  years or older who has certain medical conditions and previously received 1 or more doses of PPSV23 vaccine should receive 1 dose of PCV13. The PCV13 vaccine dose should be obtained 1 or more years after the last PPSV23 vaccine dose.  Pneumococcal polysaccharide (PPSV23) vaccine. When PCV13 is also indicated, PCV13 should be obtained first. All adults aged 63 years and older should be immunized. An adult younger than age 21 years who has certain medical conditions should be immunized. Any person who resides in a nursing home or long-term care facility should be immunized. An adult smoker should be immunized. People with an immunocompromised condition and certain other conditions should receive both PCV13 and PPSV23 vaccines. People with human immunodeficiency virus (HIV) infection should be immunized as soon as possible after diagnosis. Immunization during chemotherapy or radiation therapy should be avoided. Routine use of PPSV23 vaccine is not recommended for American Indians, [place] Natives, or people younger than 65 years unless there are medical conditions that require PPSV23 vaccine. When indicated, people who have unknown immunization and have no record of immunization should receive PPSV23 vaccine. One-time revaccination 5 years after the first dose of PPSV23 is recommended for people aged 19-64 years who have chronic kidney failure, nephrotic syndrome, asplenia, or immunocompromised conditions. People who received 1-2 doses of PPSV23 before age 3 years should receive another dose of PPSV23 vaccine at age 53 years or later if at least 5 years have passed since the previous dose. Doses of PPSV23 are not needed for people immunized with PPSV23 at or after age 63 years.  Meningococcal vaccine. Adults with asplenia or persistent complement component deficiencies should receive 2 doses of quadrivalent meningococcal conjugate (MenACWY-D) vaccine. The doses should be obtained at least 2 months apart.  Microbiologists working with certain meningococcal bacteria, [employer] recruits, people at risk during an outbreak, and people who travel to or live in countries with a high rate of meningitis should be immunized. A first-year college student up through age  21 years who is living in a residence hall should receive a dose if she did not receive a dose on or after her 16th birthday. Adults who have certain high-risk conditions should receive one or more doses of vaccine.  Hepatitis A vaccine. Adults who wish to be protected from this disease, have certain high-risk conditions, work with hepatitis A-infected animals, work in hepatitis A research labs, or travel to or work in countries with a high rate of hepatitis A should be immunized. Adults who were previously unvaccinated and who anticipate close contact with an international adoptee during the first 60 days after arrival in the [place] from a country with a high rate of hepatitis A should be immunized.  Hepatitis B vaccine. Adults who wish to be protected from this disease, have certain high-risk conditions, may be exposed to blood or other infectious body fluids, are household contacts or sex partners of hepatitis B positive people, are clients or workers in certain care facilities, or travel to or work in countries with a high rate of hepatitis B should be immunized.  Haemophilus influenzae type b (Hib) vaccine. A previously unvaccinated person with asplenia or sickle cell disease or having a scheduled splenectomy should receive 1 dose of Hib vaccine. Regardless of previous immunization, a recipient of a hematopoietic stem cell transplant should receive a 3-dose series 6-12 months after her successful transplant. Hib vaccine is not recommended for adults with HIV infection. Preventive Services / Frequency Ages 58 to 18 years  Blood pressure check.** / Every 1 to 2 years.  Lipid and cholesterol check.** / Every 5 years beginning at age  39.  Clinical breast exam.** / Every 3 years for women in their 35s and 54s.  BRCA-related cancer risk assessment.** / For women who have family members with a BRCA-related cancer (breast, ovarian, tubal, or peritoneal cancers).  Pap test.** / Every 2 years from ages 57 through 23. Every 3 years starting at age 35 through age 59 or 14 with a history of 3 consecutive normal Pap tests.  HPV screening.** / Every 3 years from ages 95 through ages 61 to 48 with a history of 3 consecutive normal Pap tests.  Hepatitis C blood test.** / For any individual with known risks for hepatitis C.  Skin self-exam. / Monthly.  Influenza vaccine. / Every year.  Tetanus, diphtheria, and acellular pertussis (Tdap, Td) vaccine.** / Consult your health care provider. Pregnant women should receive 1 dose of Tdap vaccine during each pregnancy. 1 dose of Td every 10 years.  Varicella vaccine.** / Consult your health care provider. Pregnant females who do not have evidence of immunity should receive the first dose after pregnancy.  HPV vaccine. / 3 doses over 6 months, if 21 and younger. The vaccine is not recommended for use in pregnant females. However, pregnancy testing is not needed before receiving a dose.  Measles, mumps, rubella (MMR) vaccine.** / You need at least 1 dose of MMR if you were born in 1957 or later. You may also need a 2nd dose. For females of childbearing age, rubella immunity should be determined. If there is no evidence of immunity, females who are not pregnant should be vaccinated. If there is no evidence of immunity, females who are pregnant should delay immunization until after pregnancy.  Pneumococcal 13-valent conjugate (PCV13) vaccine.** / Consult your health care provider.  Pneumococcal polysaccharide (PPSV23) vaccine.** / 1 to 2 doses if you smoke cigarettes or if you have certain conditions.  Meningococcal vaccine.** /  1 dose if you are age 19 to 21 years and a first-year college  student living in a residence hall, or have one of several medical conditions, you need to get vaccinated against meningococcal disease. You may also need additional booster doses.  Hepatitis A vaccine.** / Consult your health care provider.  Hepatitis B vaccine.** / Consult your health care provider.  Haemophilus influenzae type b (Hib) vaccine.** / Consult your health care provider. Ages 40 to 64 years  Blood pressure check.** / Every 1 to 2 years.  Lipid and cholesterol check.** / Every 5 years beginning at age 20 years.  Lung cancer screening. / Every year if you are aged 55-80 years and have a 30-pack-year history of smoking and currently smoke or have quit within the past 15 years. Yearly screening is stopped once you have quit smoking for at least 15 years or develop a health problem that would prevent you from having lung cancer treatment.  Clinical breast exam.** / Every year after age 40 years.  BRCA-related cancer risk assessment.** / For women who have family members with a BRCA-related cancer (breast, ovarian, tubal, or peritoneal cancers).  Mammogram.** / Every year beginning at age 40 years and continuing for as long as you are in good health. Consult with your health care provider.  Pap test.** / Every 3 years starting at age 30 years through age 65 or 70 years with a history of 3 consecutive normal Pap tests.  HPV screening.** / Every 3 years from ages 30 years through ages 65 to 70 years with a history of 3 consecutive normal Pap tests.  Fecal occult blood test (FOBT) of stool. / Every year beginning at age 50 years and continuing until age 75 years. You may not need to do this test if you get a colonoscopy every 10 years.  Flexible sigmoidoscopy or colonoscopy.** / Every 5 years for a flexible sigmoidoscopy or every 10 years for a colonoscopy beginning at age 50 years and continuing until age 75 years.  Hepatitis C blood test.** / For all people born from 1945 through  1965 and any individual with known risks for hepatitis C.  Skin self-exam. / Monthly.  Influenza vaccine. / Every year.  Tetanus, diphtheria, and acellular pertussis (Tdap/Td) vaccine.** / Consult your health care provider. Pregnant women should receive 1 dose of Tdap vaccine during each pregnancy. 1 dose of Td every 10 years.  Varicella vaccine.** / Consult your health care provider. Pregnant females who do not have evidence of immunity should receive the first dose after pregnancy.  Zoster vaccine.** / 1 dose for adults aged 60 years or older.  Measles, mumps, rubella (MMR) vaccine.** / You need at least 1 dose of MMR if you were born in 1957 or later. You may also need a 2nd dose. For females of childbearing age, rubella immunity should be determined. If there is no evidence of immunity, females who are not pregnant should be vaccinated. If there is no evidence of immunity, females who are pregnant should delay immunization until after pregnancy.  Pneumococcal 13-valent conjugate (PCV13) vaccine.** / Consult your health care provider.  Pneumococcal polysaccharide (PPSV23) vaccine.** / 1 to 2 doses if you smoke cigarettes or if you have certain conditions.  Meningococcal vaccine.** / Consult your health care provider.  Hepatitis A vaccine.** / Consult your health care provider.  Hepatitis B vaccine.** / Consult your health care provider.  Haemophilus influenzae type b (Hib) vaccine.** / Consult your health care provider. Ages 65   years and over  Blood pressure check.** / Every 1 to 2 years.  Lipid and cholesterol check.** / Every 5 years beginning at age 22 years.  Lung cancer screening. / Every year if you are aged 73-80 years and have a 30-pack-year history of smoking and currently smoke or have quit within the past 15 years. Yearly screening is stopped once you have quit smoking for at least 15 years or develop a health problem that would prevent you from having lung cancer  treatment.  Clinical breast exam.** / Every year after age 4 years.  BRCA-related cancer risk assessment.** / For women who have family members with a BRCA-related cancer (breast, ovarian, tubal, or peritoneal cancers).  Mammogram.** / Every year beginning at age 40 years and continuing for as long as you are in good health. Consult with your health care provider.  Pap test.** / Every 3 years starting at age 9 years through age 34 or 91 years with 3 consecutive normal Pap tests. Testing can be stopped between 65 and 70 years with 3 consecutive normal Pap tests and no abnormal Pap or HPV tests in the past 10 years.  HPV screening.** / Every 3 years from ages 57 years through ages 64 or 45 years with a history of 3 consecutive normal Pap tests. Testing can be stopped between 65 and 70 years with 3 consecutive normal Pap tests and no abnormal Pap or HPV tests in the past 10 years.  Fecal occult blood test (FOBT) of stool. / Every year beginning at age 15 years and continuing until age 17 years. You may not need to do this test if you get a colonoscopy every 10 years.  Flexible sigmoidoscopy or colonoscopy.** / Every 5 years for a flexible sigmoidoscopy or every 10 years for a colonoscopy beginning at age 86 years and continuing until age 71 years.  Hepatitis C blood test.** / For all people born from [birth year] through 1966 and any individual with known risks for hepatitis C.  Osteoporosis screening.** / A one-time screening for women ages 83 years and over and women at risk for fractures or osteoporosis.  Skin self-exam. / Monthly.  Influenza vaccine. / Every year.  Tetanus, diphtheria, and acellular pertussis (Tdap/Td) vaccine.** / 1 dose of Td every 10 years.  Varicella vaccine.** / Consult your health care provider.  Zoster vaccine.** / 1 dose for adults aged 61 years or older.  Pneumococcal 13-valent conjugate (PCV13) vaccine.** / Consult your health care provider.  Pneumococcal  polysaccharide (PPSV23) vaccine.** / 1 dose for all adults aged 28 years and older.  Meningococcal vaccine.** / Consult your health care provider.  Hepatitis A vaccine.** / Consult your health care provider.  Hepatitis B vaccine.** / Consult your health care provider.  Haemophilus influenzae type b (Hib) vaccine.** / Consult your health care provider. ** Family history and personal history of risk and conditions may change your health care provider's recommendations. Document Released: 08/07/2001 Document Revised: 10/26/2013 Document Reviewed: 11/06/2010 [facility] Patient Information 2015 [place], [place]. This information is not intended to replace advice given to you by your health care provider. Make sure you discuss any questions you have with your health care provider.

## 2014-08-09 ENCOUNTER — Other Ambulatory Visit: Payer: Self-pay

## 2014-08-09 MED ORDER — RANITIDINE HCL 300 MG PO TABS: 300.0000 mg | ORAL_TABLET | Freq: Every day | ORAL | Status: DC

## 2014-08-11 LAB — CYTOLOGY - PAP

## 2014-08-12 ENCOUNTER — Ambulatory Visit: Payer: Self-pay | Admitting: Family Medicine

## 2014-08-13 ENCOUNTER — Encounter: Payer: Self-pay | Admitting: Family Medicine

## 2014-08-13 ENCOUNTER — Ambulatory Visit (INDEPENDENT_AMBULATORY_CARE_PROVIDER_SITE_OTHER): Payer: Self-pay | Admitting: Family Medicine

## 2014-08-13 VITALS — BP 116/76 | HR 81 | Temp 98.0°F | Wt 238.8 lb

## 2014-08-13 DIAGNOSIS — E1151 Type 2 diabetes mellitus with diabetic peripheral angiopathy without gangrene: Secondary | ICD-10-CM | POA: Insufficient documentation

## 2014-08-13 DIAGNOSIS — IMO0002 Reserved for concepts with insufficient information to code with codable children: Secondary | ICD-10-CM | POA: Insufficient documentation

## 2014-08-13 DIAGNOSIS — R739 Hyperglycemia, unspecified: Secondary | ICD-10-CM

## 2014-08-13 DIAGNOSIS — E1165 Type 2 diabetes mellitus with hyperglycemia: Secondary | ICD-10-CM

## 2014-08-13 LAB — BASIC METABOLIC PANEL
BUN: 15 mg/dL (ref 6–23)
CO2: 25 mEq/L (ref 19–32)
Calcium: 9.8 mg/dL (ref 8.4–10.5)
Chloride: 109 mEq/L (ref 96–112)
Creatinine, Ser: 0.66 mg/dL (ref 0.40–1.20)
GFR: 122 mL/min (ref >60.00)
Glucose, Bld: 209 mg/dL — ABNORMAL HIGH (ref 70–99)
Potassium: 4.9 mEq/L (ref 3.5–5.1)
Sodium: 140 mEq/L (ref 135–145)

## 2014-08-13 LAB — HEMOGLOBIN A1C: Hgb A1c MFr Bld: 9.2 % — ABNORMAL HIGH (ref 4.6–6.5)

## 2014-08-13 NOTE — Assessment & Plan Note (Signed)
Check a1c  Pt given pt ed for diet / diabetes Recheck labs in 3 months

## 2014-08-13 NOTE — Progress Notes (Signed)
Pre visit review using our clinic review tool, if applicable. No additional management support is needed unless otherwise documented below in the visit note. 

## 2014-08-13 NOTE — Progress Notes (Signed)
Subjective:    Patient ID: Felicia Solis, female    DOB: [birth year], 49 y.o.   MRN: [id number]  HPI  Patient here for f/u dm-- new onset.  Past Medical History  Diagnosis Date  . Angina     pt states she has occasional chest pains no other sxs.. seen in ed  at  [facility] .. all tsts negative.   . Asthma 5 yrs ago     told she had asthma.. does not remember doctors name.   . Shortness of breath     with exertion  . Tuberculosis     pt states she took 9 months of medicine for positive tb skin test.   . Anemia     anemia  when pregnant.  10 yrs ago.   Marland Kitchen Headache(784.0)     years ago told h/a s were migraines.   . Depression     takes citalopram  . Renal disorder     kidney stones    Review of Systems  Constitutional: Negative for diaphoresis, appetite change, fatigue and unexpected weight change.  Eyes: Negative for pain, redness and visual disturbance.  Respiratory: Negative for cough, chest tightness, shortness of breath and wheezing.   Cardiovascular: Negative for chest pain, palpitations and leg swelling.  Endocrine: Negative for cold intolerance, heat intolerance, polydipsia, polyphagia and polyuria.  Genitourinary: Negative for dysuria, frequency and difficulty urinating.  Neurological: Negative for dizziness, light-headedness, numbness and headaches.       Objective:    Physical Exam  Constitutional: She is oriented to person, place, and time. She appears well-developed and well-nourished.  HENT:  Head: Normocephalic and atraumatic.  Eyes: Conjunctivae and EOM are normal.  Neck: Normal range of motion. Neck supple. No JVD present. Carotid bruit is not present. No thyromegaly present.  Cardiovascular: Normal rate, regular rhythm and normal heart sounds.   No murmur heard. Pulmonary/Chest: Effort normal and breath sounds normal. No respiratory distress. She has no wheezes. She has no rales. She exhibits no tenderness.  Musculoskeletal: She exhibits no edema.    Neurological: She is alert and oriented to person, place, and time.  Psychiatric: She has a normal mood and affect.    BP 116/76 mmHg  Pulse 81  Temp(Src) 98 F (36.7 C) (Oral)  Wt 238 lb 12.8 oz (108.319 kg)  SpO2 98% Wt Readings from Last 3 Encounters:  08/13/14 238 lb 12.8 oz (108.319 kg)  08/06/14 238 lb 12.8 oz (108.319 kg)  08/19/13 245 lb (111.131 kg)     Lab Results  Component Value Date   WBC 10.0 08/06/2014   HGB 13.8 08/06/2014   HCT 41.9 08/06/2014   PLT 343 08/06/2014   GLUCOSE 179* 08/06/2014   CHOL 173 08/06/2014   TRIG 176* 08/06/2014   HDL 43 08/06/2014   LDLCALC 95 08/06/2014   ALT 22 08/06/2014   AST 19 08/06/2014   NA 139 08/06/2014   K 4.2 08/06/2014   CL 103 08/06/2014   CREATININE 0.54 08/06/2014   BUN 9 08/06/2014   CO2 24 08/06/2014   TSH 0.936 08/06/2014   HGBA1C 6.7* 09/03/2011    Dg Lumbar Spine 2-3 Views  08/06/2014   CLINICAL DATA:  Low back pain with radicular symptoms for 2 months  EXAM: LUMBAR SPINE - 2-3 VIEW  COMPARISON:  None.  FINDINGS: Frontal, lateral, and spot lumbosacral lateral images were obtained. There are 5 non-rib-bearing lumbar type vertebral bodies. T12 ribs are essentially aplastic. No fracture or spondylolisthesis.  Disc spaces appear intact. No appreciable erosive change. There is calcification in the anterior ligament at T12-L1.  IMPRESSION: No fracture or spondylolisthesis. No appreciable disc space narrowing.   Electronically Signed   By: Lowella Grip III M.D.   On: 08/06/2014 21:56       Assessment & Plan:   Problem List Items Addressed This Visit    Diabetes mellitus type II, uncontrolled    Check a1c  Pt given pt ed for diet / diabetes Recheck labs in 3 months       Other Visit Diagnoses    Hyperglycemia    -  Primary    Relevant Orders    Basic metabolic panel    Hemoglobin A1c        Garnet Koyanagi, DO

## 2014-08-13 NOTE — Patient Instructions (Addendum)
Diabetes and Standards of Medical Care Diabetes is complicated. You may find that your diabetes team includes a dietitian, nurse, diabetes educator, eye doctor, and more. To help everyone know what is going on and to help you get the care you deserve, the following schedule of care was developed to help keep you on track. Below are the tests, exams, vaccines, medicines, education, and plans you will need. HbA1c test This test shows how well you have controlled your glucose over the past 2-3 months. It is used to see if your diabetes management plan needs to be adjusted.   It is performed at least 2 times a year if you are meeting treatment goals.  It is performed 4 times a year if therapy has changed or if you are not meeting treatment goals. Blood pressure test  This test is performed at every routine medical visit. The goal is less than 140/90 mm Hg for most people, but 130/80 mm Hg in some cases. Ask your health care provider about your goal. Dental exam  Follow up with the dentist regularly. Eye exam  If you are diagnosed with type 1 diabetes as a child, get an exam upon reaching the age of 7 years or older and have had diabetes for 3-5 years. Yearly eye exams are recommended after that initial eye exam.  If you are diagnosed with type 1 diabetes as an adult, get an exam within 5 years of diagnosis and then yearly.  If you are diagnosed with type 2 diabetes, get an exam as soon as possible after the diagnosis and then yearly. Foot care exam  Visual foot exams are performed at every routine medical visit. The exams check for cuts, injuries, or other problems with the feet.  A comprehensive foot exam should be done yearly. This includes visual inspection as well as assessing foot pulses and testing for loss of sensation.  Check your feet nightly for cuts, injuries, or other problems with your feet. Tell your health care provider if anything is not healing. Kidney function test (urine  microalbumin)  This test is performed once a year.  Type 1 diabetes: The first test is performed 5 years after diagnosis.  Type 2 diabetes: The first test is performed at the time of diagnosis.  A serum creatinine and estimated glomerular filtration rate (eGFR) test is done once a year to assess the level of chronic kidney disease (CKD), if present. Lipid profile (cholesterol, HDL, LDL, triglycerides)  Performed every 5 years for most people.  The goal for LDL is less than 100 mg/dL. If you are at high risk, the goal is less than 70 mg/dL.  The goal for HDL is 40 mg/dL-50 mg/dL for men and 50 mg/dL-60 mg/dL for women. An HDL cholesterol of 60 mg/dL or higher gives some protection against heart disease.  The goal for triglycerides is less than 150 mg/dL. Influenza vaccine, pneumococcal vaccine, and hepatitis B vaccine  The influenza vaccine is recommended yearly.  It is recommended that people with diabetes who are over 15 years old get the pneumonia vaccine. In some cases, two separate shots may be given. Ask your health care provider if your pneumonia vaccination is up to date.  The hepatitis B vaccine is also recommended for adults with diabetes. Diabetes self-management education  Education is recommended at diagnosis and ongoing as needed. Treatment plan  Your treatment plan is reviewed at every medical visit. Document Released: 04/08/2009 Document Revised: 10/26/2013 Document Reviewed: 11/11/2012 [facility] Patient Information 2015 [place],  [employer]. This information is not intended to replace advice given to you by your health care provider. Make sure you discuss any questions you have with your health care provider. Basic Carbohydrate Counting for Diabetes Mellitus Carbohydrate counting is a method for keeping track of the amount of carbohydrates you eat. Eating carbohydrates naturally increases the level of sugar (glucose) in your blood, so it is important for you to know the  amount that is okay for you to have in every meal. Carbohydrate counting helps keep the level of glucose in your blood within normal limits. The amount of carbohydrates allowed is different for every person. A dietitian can help you calculate the amount that is right for you. Once you know the amount of carbohydrates you can have, you can count the carbohydrates in the foods you want to eat. Carbohydrates are found in the following foods:  Grains, such as breads and cereals.  Dried beans and soy products.  Starchy vegetables, such as potatoes, peas, and corn.  Fruit and fruit juices.  Milk and yogurt.  Sweets and snack foods, such as cake, cookies, candy, chips, soft drinks, and fruit drinks. CARBOHYDRATE COUNTING There are two ways to count the carbohydrates in your food. You can use either of the methods or a combination of both. Reading the "Nutrition Facts" on [place] The "Nutrition Facts" is an area that is included on the labels of almost all packaged food and beverages in the [place]. It includes the serving size of that food or beverage and information about the nutrients in each serving of the food, including the grams (g) of carbohydrate per serving.  Decide the number of servings of this food or beverage that you will be able to eat or drink. Multiply that number of servings by the number of grams of carbohydrate that is listed on the label for that serving. The total will be the amount of carbohydrates you will be having when you eat or drink this food or beverage. Learning Standard Serving Sizes of Food When you eat food that is not packaged or does not include "Nutrition Facts" on the label, you need to measure the servings in order to count the amount of carbohydrates.A serving of most carbohydrate-rich foods contains about 15 g of carbohydrates. The following list includes serving sizes of carbohydrate-rich foods that provide 15 g ofcarbohydrate per serving:   1  slice of bread (1 oz) or 1 six-inch tortilla.    of a hamburger bun or English muffin.  4-6 crackers.   cup unsweetened dry cereal.    cup hot cereal.   cup rice or pasta.    cup mashed potatoes or  of a large baked potato.  1 cup fresh fruit or one small piece of fruit.    cup canned or frozen fruit or fruit juice.  1 cup milk.   cup plain fat-free yogurt or yogurt sweetened with artificial sweeteners.   cup cooked dried beans or starchy vegetable, such as peas, corn, or potatoes.  Decide the number of standard-size servings that you will eat. Multiply that number of servings by 15 (the grams of carbohydrates in that serving). For example, if you eat 2 cups of strawberries, you will have eaten 2 servings and 30 g of carbohydrates (2 servings x 15 g = 30 g). For foods such as soups and casseroles, in which more than one food is mixed in, you will need to count the carbohydrates in each food that is included. EXAMPLE  OF CARBOHYDRATE COUNTING Sample Dinner  3 oz chicken breast.   cup of brown rice.   cup of corn.  1 cup milk.   1 cup strawberries with sugar-free whipped topping.  Carbohydrate Calculation Step 1: Identify the foods that contain carbohydrates:   Rice.   Corn.   Milk.   Strawberries. Step 2:Calculate the number of servings eaten of each:   2 servings of rice.   1 serving of corn.   1 serving of milk.   1 serving of strawberries. Step 3: Multiply each of those number of servings by 15 g:   2 servings of rice x 15 g = 30 g.   1 serving of corn x 15 g = 15 g.   1 serving of milk x 15 g = 15 g.   1 serving of strawberries x 15 g = 15 g. Step 4: Add together all of the amounts to find the total grams of carbohydrates eaten: 30 g + 15 g + 15 g + 15 g = 75 g. Document Released: 06/11/2005 Document Revised: 10/26/2013 Document Reviewed: 05/08/2013 [facility] Patient Information 2015 [place], [place]. This information is  not intended to replace advice given to you by your health care provider. Make sure you discuss any questions you have with your health care provider.

## 2014-08-18 ENCOUNTER — Ambulatory Visit: Payer: Self-pay

## 2014-08-23 ENCOUNTER — Telehealth: Payer: Self-pay

## 2014-08-23 DIAGNOSIS — E119 Type 2 diabetes mellitus without complications: Secondary | ICD-10-CM

## 2014-08-23 DIAGNOSIS — E781 Pure hyperglyceridemia: Secondary | ICD-10-CM

## 2014-08-23 MED ORDER — METFORMIN HCL ER 500 MG PO TB24: 500.0000 mg | ORAL_TABLET | Freq: Every day | ORAL | Status: DC

## 2014-08-23 NOTE — Telephone Encounter (Signed)
-----   Message from Rosalita Chessman, DO sent at 08/19/2014  9:27 PM EST ----- ---DM II uncontrolled-----metformin xr 500 mg #30  1 po qd, 2 refills Check glucose bid --- recheck labs 3 months---lipid, hep, bmp, hgba1c, microalbumin----

## 2014-08-23 NOTE — Telephone Encounter (Signed)
Patient has bee made aware and she verbalized understanding. Rx has been sent.    KP

## 2014-08-26 ENCOUNTER — Ambulatory Visit: Payer: Self-pay

## 2014-08-26 ENCOUNTER — Telehealth: Payer: Self-pay | Admitting: Family Medicine

## 2014-08-26 MED ORDER — ONETOUCH DELICA LANCETS FINE MISC: Status: DC

## 2014-08-26 MED ORDER — GLUCOSE BLOOD VI STRP: ORAL_STRIP | Status: DC

## 2014-08-26 NOTE — Telephone Encounter (Signed)
Rx faxed,      KP

## 2014-08-26 NOTE — Telephone Encounter (Signed)
AT DR LOWNE'S REQUEST THE PATIENT IS NOW TESTING 2 TIMES A DAY AND IS RUNNING OUT OF LANCETS AND TEST STRIPS   PLEASE SEND AN RX FOR MORE OF EACH TO [facility] ON [address]

## 2014-08-31 ENCOUNTER — Telehealth: Payer: Self-pay

## 2014-08-31 NOTE — Telephone Encounter (Signed)
-----   Message from Oneta Rack sent at 08/31/2014  8:19 AM EST ----- Regarding: Samples  Contact: [phone] Pt would like to know if there are any samples for glucose blood (ONETOUCH VERIO) test strip

## 2014-08-31 NOTE — Telephone Encounter (Signed)
Spoke with patient and made her aware that since she does not have insurance she can check with the pharmacy to see which meter would be more cost efficient and we will be mor that happy to call int he Rx for her. She said the onetouch strips are too expensive since she is self pay.      KP

## 2014-09-06 ENCOUNTER — Ambulatory Visit: Payer: Self-pay | Admitting: Family Medicine

## 2014-10-01 ENCOUNTER — Ambulatory Visit (INDEPENDENT_AMBULATORY_CARE_PROVIDER_SITE_OTHER): Payer: Self-pay | Admitting: Family Medicine

## 2014-10-01 ENCOUNTER — Encounter: Payer: Self-pay | Admitting: Family Medicine

## 2014-10-01 ENCOUNTER — Encounter: Payer: Self-pay | Admitting: Gastroenterology

## 2014-10-01 VITALS — BP 129/90 | HR 82 | Temp 98.2°F | Ht 66.0 in | Wt 236.8 lb

## 2014-10-01 DIAGNOSIS — Z8371 Family history of colonic polyps: Secondary | ICD-10-CM

## 2014-10-01 DIAGNOSIS — K921 Melena: Secondary | ICD-10-CM

## 2014-10-01 DIAGNOSIS — Z83719 Family history of colon polyps, unspecified: Secondary | ICD-10-CM

## 2014-10-01 NOTE — Progress Notes (Signed)
Subjective:    Patient ID: Felicia Solis, female    DOB: 01-14-1966, 49 y.o.   MRN: [id number]  HPI  Patient here c/o rectal bleed.  She notice blood Wednesday x 1 only---- no straining, but she has had some constipation.   Past Medical History  Diagnosis Date  . Angina     pt states she has occasional chest pains no other sxs.. seen in ed  at  [facility] .. all tsts negative.   . Asthma 5 yrs ago     told she had asthma.. does not remember doctors name.   . Shortness of breath     with exertion  . Tuberculosis     pt states she took 9 months of medicine for positive tb skin test.   . Anemia     anemia  when pregnant.  10 yrs ago.   Marland Kitchen Headache(784.0)     years ago told h/a s were migraines.   . Depression     takes citalopram  . Renal disorder     kidney stones    Review of Systems  Constitutional: Negative for activity change, appetite change, fatigue and unexpected weight change.  Respiratory: Negative for cough and shortness of breath.   Cardiovascular: Negative for chest pain and palpitations.  Gastrointestinal: Positive for blood in stool. Negative for abdominal pain and abdominal distention.  Psychiatric/Behavioral: Negative for behavioral problems and dysphoric mood. The patient is not nervous/anxious.     Current Outpatient Prescriptions on File Prior to Visit  Medication Sig Dispense Refill  . acetaminophen (TYLENOL) 500 MG tablet Take 1,000 mg by mouth every 6 (six) hours as needed for mild pain.    . citalopram (CELEXA) 10 MG tablet 1 po qd 30 tablet 2  . cyclobenzaprine (FLEXERIL) 10 MG tablet Take 1 tablet (10 mg total) by mouth 3 (three) times daily as needed for muscle spasms. 30 tablet 0  . glucose blood (ONETOUCH VERIO) test strip Onetouch Verio Check blood sugar twice daily 100 each 12  . metFORMIN (GLUCOPHAGE XR) 500 MG 24 hr tablet Take 1 tablet (500 mg total) by mouth daily with breakfast. 30 tablet 2  . Multiple Vitamin (MULTIVITAMIN WITH MINERALS)  TABS tablet Take 1 tablet by mouth daily.    Glory Rosebush DELICA LANCETS FINE MISC Check blood sugar twice daily 100 each 12  . ranitidine (ZANTAC) 300 MG tablet Take 1 tablet (300 mg total) by mouth at bedtime. 90 tablet 3   No current facility-administered medications on file prior to visit.       Objective:    Physical Exam  Constitutional: She is oriented to person, place, and time. She appears well-developed and well-nourished. No distress.  HENT:  Right Ear: External ear normal.  Left Ear: External ear normal.  Nose: Nose normal.  Mouth/Throat: Oropharynx is clear and moist.  Eyes: EOM are normal. Pupils are equal, round, and reactive to light.  Neck: Normal range of motion. Neck supple.  Cardiovascular: Normal rate, regular rhythm and normal heart sounds.   No murmur heard. Pulmonary/Chest: Effort normal and breath sounds normal. No respiratory distress. She has no wheezes. She has no rales. She exhibits no tenderness.  Genitourinary: Guaiac negative stool.  Neurological: She is alert and oriented to person, place, and time.  Psychiatric: She has a normal mood and affect. Her behavior is normal. Judgment and thought content normal.    BP 129/90 mmHg  Pulse 82  Temp(Src) 98.2 F (36.8 C) (Oral)  Ht 5\' 6"  (1.676 m)  Wt 236 lb 12.8 oz (107.412 kg)  BMI 38.24 kg/m2  SpO2 97% Wt Readings from Last 3 Encounters:  10/01/14 236 lb 12.8 oz (107.412 kg)  08/13/14 238 lb 12.8 oz (108.319 kg)  08/06/14 238 lb 12.8 oz (108.319 kg)     Lab Results  Component Value Date   WBC 10.0 08/06/2014   HGB 13.8 08/06/2014   HCT 41.9 08/06/2014   PLT 343 08/06/2014   GLUCOSE 209* 08/13/2014   CHOL 173 08/06/2014   TRIG 176* 08/06/2014   HDL 43 08/06/2014   LDLCALC 95 08/06/2014   ALT 22 08/06/2014   AST 19 08/06/2014   NA 140 08/13/2014   K 4.9 08/13/2014   CL 109 08/13/2014   CREATININE 0.66 08/13/2014   BUN 15 08/13/2014   CO2 25 08/13/2014   TSH 0.936 08/06/2014   HGBA1C  9.2* 08/13/2014       Assessment & Plan:   Problem List Items Addressed This Visit    Blood in stool - [place] fiber in diet Refer to GI       Relevant Orders   Ambulatory referral to Gastroenterology    Other Visit Diagnoses    Family history of colonic polyps        Relevant Orders    Ambulatory referral to Gastroenterology       I have discontinued Ms. Birr's Aspirin-Acetaminophen-Caffeine. I am also having her maintain her acetaminophen, multivitamin with minerals, citalopram, cyclobenzaprine, ranitidine, metFORMIN, glucose blood, and ONETOUCH DELICA LANCETS FINE.  No orders of the defined types were placed in this encounter.     Garnet Koyanagi, DO

## 2014-10-01 NOTE — Patient Instructions (Signed)

## 2014-10-01 NOTE — Progress Notes (Signed)
Pre visit review using our clinic review tool, if applicable. No additional management support is needed unless otherwise documented below in the visit note. 

## 2014-10-01 NOTE — Assessment & Plan Note (Signed)
Inc po water Inc fiber in diet Refer to GI

## 2014-10-07 ENCOUNTER — Ambulatory Visit (INDEPENDENT_AMBULATORY_CARE_PROVIDER_SITE_OTHER): Payer: Self-pay | Admitting: Gastroenterology

## 2014-10-07 ENCOUNTER — Encounter: Payer: Self-pay | Admitting: Gastroenterology

## 2014-10-07 VITALS — BP 126/72 | HR 96 | Ht 66.75 in | Wt 237.1 lb

## 2014-10-07 DIAGNOSIS — K921 Melena: Secondary | ICD-10-CM

## 2014-10-07 NOTE — Progress Notes (Signed)
_                                                                                                                History of Present Illness:  Felicia Solis is a 49 year old Afro-American female referred at the request of Dr. Kennon Holter for evaluation of rectal bleeding.  On occasion she has seen small amounts of blood on the toilet tissue.  Approximately week ago she saw bright red blood mixed with her stools and discoloring the water.  She denies rectal or abdominal pain.  There has been no change in bowel habits.  Paternal grandfather had colon cancer and father had colon polyps.   Past Medical History  Diagnosis Date  . Angina     pt states she has occasional chest pains no other sxs.. seen in ed  at  [facility] .. all tsts negative.   . Asthma 5 yrs ago     told she had asthma.. does not remember doctors name.   . Shortness of breath     with exertion  . Tuberculosis     pt states she took 9 months of medicine for positive tb skin test.   . Anemia     anemia  when pregnant.  10 yrs ago.   Marland Kitchen Headache(784.0)     years ago told h/a s were migraines.   . Depression     takes citalopram  . Renal disorder     kidney stones  . Diabetes 2016   Past Surgical History  Procedure Laterality Date  . Endometrial ablation    . Tubal ligation    . Laparoscopy      years ago ..pt does not know where surgery was done ... states" my stomach would blow up then go down"  . Dilation and curettage of uterus  1999  . Thyroidectomy  09/28/2011    Procedure: THYROIDECTOMY;  Surgeon: Izora Gala, MD;  Location: [place];  Service: ENT;  Laterality: Right;  RIGHT THYROIDECTOMY WITH FROZEN SECTION   family history includes Breast cancer in her maternal aunt; Colon cancer in her paternal grandfather; Colon polyps in her father and father; Diabetes in her brother, mother, and sister; Gallbladder disease in her maternal grandmother and mother; Heart disease in her other; Hyperlipidemia in her father  and mother; Hypertension in her father and mother; Ovarian cancer in her maternal grandmother. There is no history of Anesthesia problems, Hypotension, Malignant hyperthermia, Pseudochol deficiency, or Esophageal cancer. Current Outpatient Prescriptions  Medication Sig Dispense Refill  . acetaminophen (TYLENOL) 500 MG tablet Take 1,000 mg by mouth every 6 (six) hours as needed for mild pain.    . citalopram (CELEXA) 10 MG tablet 1 po qd 30 tablet 2  . cyclobenzaprine (FLEXERIL) 10 MG tablet Take 1 tablet (10 mg total) by mouth 3 (three) times daily as needed for muscle spasms. 30 tablet 0  . glucose blood (ONETOUCH VERIO) test strip Onetouch Verio Check blood sugar twice daily 100 each 12  . metFORMIN (GLUCOPHAGE  XR) 500 MG 24 hr tablet Take 1 tablet (500 mg total) by mouth daily with breakfast. 30 tablet 2  . Multiple Vitamin (MULTIVITAMIN WITH MINERALS) TABS tablet Take 1 tablet by mouth daily.    Felicia Solis DELICA LANCETS FINE MISC Check blood sugar twice daily 100 each 12  . ranitidine (ZANTAC) 300 MG tablet Take 1 tablet (300 mg total) by mouth at bedtime. 90 tablet 3   No current facility-administered medications for this visit.   Allergies as of 10/07/2014  . (No Known Allergies)    reports that she has never smoked. She has never used smokeless tobacco. She reports that she does not drink alcohol or use illicit drugs.   Review of Systems: Pertinent positive and negative review of systems were noted in the above HPI section. All other review of systems were otherwise negative.  Vital signs were reviewed in today's medical record Physical Exam: General: Well developed , well nourished, no acute distress Skin: anicteric Head: Normocephalic and atraumatic Eyes:  sclerae anicteric, EOMI Ears: Normal auditory acuity Mouth: No deformity or lesions Neck: Supple, no masses or thyromegaly Lungs: Clear throughout to auscultation Heart: Regular rate and rhythm; no rubs or bruits.  There  is a 1/6 early systolic murmur Abdomen: Soft, non tender and non distended. No masses, hepatosplenomegaly or hernias noted. Normal Bowel sounds Rectal: There is a small skin tag Musculoskeletal: Symmetrical with no gross deformities  Skin: No lesions on visible extremities Pulses:  Normal pulses noted Extremities: No clubbing, cyanosis, edema or deformities noted Neurological: Alert oriented x 4, grossly nonfocal Cervical Nodes:  No significant cervical adenopathy Inguinal Nodes: No significant inguinal adenopathy Psychological:  Alert and cooperative. Normal mood and affect  See Assessment and Plan under Problem List

## 2014-10-07 NOTE — Patient Instructions (Signed)
You have been scheduled for a colonoscopy. Please follow written instructions given to you at your visit today.  Please pick up your prep supplies at the pharmacy within the next 1-3 days. If you use inhalers (even only as needed), please bring them with you on the day of your procedure. Your physician has requested that you go to www.startemmi.com and enter the access code given to you at your visit today. This web site gives a general overview about your procedure. However, you should still follow specific instructions given to you by our office regarding your preparation for the procedure.  We are giving you a Suprep sample kit today

## 2014-10-07 NOTE — Assessment & Plan Note (Signed)
Limited rectal bleeding is most likely hemorrhoidal.  A more proximal colonic bleeding source should be ruled out.    Recommendations #1 colonoscopy

## 2014-10-11 ENCOUNTER — Telehealth: Payer: Self-pay | Admitting: Family Medicine

## 2014-10-11 NOTE — Telephone Encounter (Signed)
Spoke with patient and she is having a cough, she said you discussed at her last visit and she feels like she has stuff in her throat. She is coughing up balls that are yellow and look like cottage cheese. Please advise      KP

## 2014-10-11 NOTE — Telephone Encounter (Signed)
Msg left to call the office     KP 

## 2014-10-11 NOTE — Telephone Encounter (Signed)
If she thinks she needs an abx she would need to be seen.  Otherwise-- she can use mucinex DM or delsym,  claritin and flonase if needed

## 2014-10-11 NOTE — Telephone Encounter (Signed)
She wants a cough medicine and maybe an antibiotic  For a cough and she would like you to call her

## 2014-10-12 NOTE — Telephone Encounter (Signed)
Msg left to call the office     KP 

## 2014-10-13 NOTE — Telephone Encounter (Signed)
Pt stated she will try OTC and will follow up if symptoms do not improve

## 2014-11-11 ENCOUNTER — Ambulatory Visit (INDEPENDENT_AMBULATORY_CARE_PROVIDER_SITE_OTHER): Payer: Self-pay | Admitting: Family Medicine

## 2014-11-11 ENCOUNTER — Encounter: Payer: Self-pay | Admitting: Family Medicine

## 2014-11-11 VITALS — BP 116/80 | HR 74 | Temp 98.1°F | Wt 237.6 lb

## 2014-11-11 DIAGNOSIS — J0110 Acute frontal sinusitis, unspecified: Secondary | ICD-10-CM

## 2014-11-11 DIAGNOSIS — E1165 Type 2 diabetes mellitus with hyperglycemia: Secondary | ICD-10-CM

## 2014-11-11 DIAGNOSIS — R809 Proteinuria, unspecified: Secondary | ICD-10-CM

## 2014-11-11 LAB — LIPID PANEL
CHOL/HDL RATIO: 5
Cholesterol: 170 mg/dL (ref 0–200)
HDL: 36.5 mg/dL — ABNORMAL LOW (ref >39.00)
LDL Cholesterol: 110 mg/dL — ABNORMAL HIGH (ref 0–99)
NonHDL: 133.5
Triglycerides: 120 mg/dL (ref 0.0–149.0)
VLDL: 24 mg/dL (ref 0.0–40.0)

## 2014-11-11 LAB — HEPATIC FUNCTION PANEL
ALT: 26 U/L (ref 0–35)
AST: 22 U/L (ref 0–37)
Albumin: 4.4 g/dL (ref 3.5–5.2)
Alkaline Phosphatase: 65 U/L (ref 39–117)
Bilirubin, Direct: 0.1 mg/dL (ref 0.0–0.3)
Total Bilirubin: 0.4 mg/dL (ref 0.2–1.2)
Total Protein: 7.3 g/dL (ref 6.0–8.3)

## 2014-11-11 LAB — POCT URINALYSIS DIPSTICK
BILIRUBIN UA: NEGATIVE
Blood, UA: NEGATIVE
Glucose, UA: NEGATIVE
KETONES UA: NEGATIVE
LEUKOCYTES UA: NEGATIVE
Nitrite, UA: NEGATIVE
PROTEIN UA: NEGATIVE
SPEC GRAV UA: 1.015
Urobilinogen, UA: 4
pH, UA: 8

## 2014-11-11 LAB — BASIC METABOLIC PANEL
BUN: 10 mg/dL (ref 6–23)
CO2: 28 meq/L (ref 19–32)
Calcium: 10.1 mg/dL (ref 8.4–10.5)
Chloride: 105 mEq/L (ref 96–112)
Creatinine, Ser: 0.53 mg/dL (ref 0.40–1.20)
GFR: 156.98 mL/min (ref >60.00)
Glucose, Bld: 129 mg/dL — ABNORMAL HIGH (ref 70–99)
POTASSIUM: 3.8 meq/L (ref 3.5–5.1)
SODIUM: 138 meq/L (ref 135–145)

## 2014-11-11 LAB — MICROALBUMIN / CREATININE URINE RATIO
Creatinine,U: 136.1 mg/dL
Microalb Creat Ratio: 2.6 mg/g (ref 0.0–30.0)
Microalb, Ur: 3.5 mg/dL — ABNORMAL HIGH (ref 0.0–1.9)

## 2014-11-11 LAB — HEMOGLOBIN A1C: HEMOGLOBIN A1C: 7.6 % — AB (ref 4.6–6.5)

## 2014-11-11 MED ORDER — AMOXICILLIN-POT CLAVULANATE 875-125 MG PO TABS: 1.0000 | ORAL_TABLET | Freq: Two times a day (BID) | ORAL | Status: DC

## 2014-11-11 MED ORDER — FLUTICASONE PROPIONATE 50 MCG/ACT NA SUSP: 2.0000 | Freq: Every day | NASAL | Status: DC

## 2014-11-11 NOTE — Progress Notes (Signed)
Pre visit review using our clinic review tool, if applicable. No additional management support is needed unless otherwise documented below in the visit note. 

## 2014-11-11 NOTE — Patient Instructions (Signed)

## 2014-11-11 NOTE — Progress Notes (Signed)
Patient ID: Felicia Solis, female    DOB: [birth year]  Age: 49 y.o. MRN: [id number]    Subjective:  Subjective HPI Felicia Solis presents for f/u diabetes.----pt also c/o sinus congestion and ear pain with headaches since Sunday.  Pt has taken mucinex sinus.  No fevers.   Glucose readings --- 120-150s Pt is due to see opth.     Review of Systems  Constitutional: Negative for fever, chills, diaphoresis, appetite change, fatigue and unexpected weight change.  HENT: Positive for congestion, postnasal drip, rhinorrhea and sinus pressure.   Eyes: Negative for pain, redness and visual disturbance.  Respiratory: Negative for cough, chest tightness, shortness of breath and wheezing.   Cardiovascular: Negative for chest pain, palpitations and leg swelling.  Endocrine: Negative for cold intolerance, heat intolerance, polydipsia, polyphagia and polyuria.  Genitourinary: Negative for dysuria, frequency and difficulty urinating.  Allergic/Immunologic: Negative for environmental allergies.  Neurological: Negative for dizziness, light-headedness, numbness and headaches.    History Past Medical History  Diagnosis Date  . Angina     pt states she has occasional chest pains no other sxs.. seen in ed  at  [facility] .. all tsts negative.   . Asthma 5 yrs ago     told she had asthma.. does not remember doctors name.   . Shortness of breath     with exertion  . Tuberculosis     pt states she took 9 months of medicine for positive tb skin test.   . Anemia     anemia  when pregnant.  10 yrs ago.   Marland Kitchen Headache(784.0)     years ago told h/a s were migraines.   . Depression     takes citalopram  . Renal disorder     kidney stones  . Diabetes 2016    She has past surgical history that includes Endometrial ablation; Tubal ligation; laparoscopy; Dilation and curettage of uterus (1999); and Thyroidectomy (09/28/2011).   Her family history includes Breast cancer in her maternal aunt; Colon cancer in her  paternal grandfather; Colon polyps in her father and father; Diabetes in her brother, mother, and sister; Gallbladder disease in her maternal grandmother and mother; Heart disease in her other; Hyperlipidemia in her father and mother; Hypertension in her father and mother; Ovarian cancer in her maternal grandmother. There is no history of Anesthesia problems, Hypotension, Malignant hyperthermia, Pseudochol deficiency, or Esophageal cancer.She reports that she has never smoked. She has never used smokeless tobacco. She reports that she does not drink alcohol or use illicit drugs.  Current Outpatient Prescriptions on File Prior to Visit  Medication Sig Dispense Refill  . acetaminophen (TYLENOL) 500 MG tablet Take 1,000 mg by mouth every 6 (six) hours as needed for mild pain.    . citalopram (CELEXA) 10 MG tablet 1 po qd 30 tablet 2  . cyclobenzaprine (FLEXERIL) 10 MG tablet Take 1 tablet (10 mg total) by mouth 3 (three) times daily as needed for muscle spasms. 30 tablet 0  . glucose blood (ONETOUCH VERIO) test strip Onetouch Verio Check blood sugar twice daily 100 each 12  . metFORMIN (GLUCOPHAGE XR) 500 MG 24 hr tablet Take 1 tablet (500 mg total) by mouth daily with breakfast. 30 tablet 2  . Multiple Vitamin (MULTIVITAMIN WITH MINERALS) TABS tablet Take 1 tablet by mouth daily.    Glory Rosebush DELICA LANCETS FINE MISC Check blood sugar twice daily 100 each 12  . ranitidine (ZANTAC) 300 MG tablet Take 1 tablet (300 mg  total) by mouth at bedtime. 90 tablet 3   No current facility-administered medications on file prior to visit.     Objective:  Objective Physical Exam  Constitutional: She is oriented to person, place, and time. She appears well-developed and well-nourished.  HENT:  Right Ear: External ear normal.  Left Ear: External ear normal.  Nose: Right sinus exhibits maxillary sinus tenderness and frontal sinus tenderness. Left sinus exhibits maxillary sinus tenderness and frontal sinus  tenderness.  Mouth/Throat: Posterior oropharyngeal edema and posterior oropharyngeal erythema present.  + PND + errythema  Eyes: Conjunctivae are normal. Right eye exhibits no discharge. Left eye exhibits no discharge.  Neck: Normal range of motion. Neck supple.  Cardiovascular: Normal rate, regular rhythm and normal heart sounds.   No murmur heard. Pulmonary/Chest: Effort normal and breath sounds normal. No respiratory distress. She has no wheezes. She has no rales. She exhibits no tenderness.  Musculoskeletal: She exhibits no edema.  Lymphadenopathy:    She has cervical adenopathy.  Neurological: She is alert and oriented to person, place, and time.  Psychiatric: She has a normal mood and affect. Her behavior is normal. Judgment and thought content normal.  Sensory exam of the foot is normal, tested with the monofilament. Good pulses, no lesions or ulcers, good peripheral pulses.   BP 116/80 mmHg  Pulse 74  Temp(Src) 98.1 F (36.7 C) (Oral)  Wt 237 lb 9.6 oz (107.775 kg)  SpO2 98% Wt Readings from Last 3 Encounters:  11/11/14 237 lb 9.6 oz (107.775 kg)  10/07/14 237 lb 2 oz (107.559 kg)  10/01/14 236 lb 12.8 oz (107.412 kg)     Lab Results  Component Value Date   WBC 10.0 08/06/2014   HGB 13.8 08/06/2014   HCT 41.9 08/06/2014   PLT 343 08/06/2014   GLUCOSE 209* 08/13/2014   CHOL 173 08/06/2014   TRIG 176* 08/06/2014   HDL 43 08/06/2014   LDLCALC 95 08/06/2014   ALT 22 08/06/2014   AST 19 08/06/2014   NA 140 08/13/2014   K 4.9 08/13/2014   CL 109 08/13/2014   CREATININE 0.66 08/13/2014   BUN 15 08/13/2014   CO2 25 08/13/2014   TSH 0.936 08/06/2014   HGBA1C 9.2* 08/13/2014    Dg Lumbar Spine 2-3 Views  08/06/2014   CLINICAL DATA:  Low back pain with radicular symptoms for 2 months  EXAM: LUMBAR SPINE - 2-3 VIEW  COMPARISON:  None.  FINDINGS: Frontal, lateral, and spot lumbosacral lateral images were obtained. There are 5 non-rib-bearing lumbar type vertebral  bodies. T12 ribs are essentially aplastic. No fracture or spondylolisthesis. Disc spaces appear intact. No appreciable erosive change. There is calcification in the anterior ligament at T12-L1.  IMPRESSION: No fracture or spondylolisthesis. No appreciable disc space narrowing.   Electronically Signed   By: Lowella Grip III M.D.   On: 08/06/2014 21:56     Assessment & Plan:  Plan I am having Ms. Flakes start on amoxicillin-clavulanate and fluticasone. I am also having her maintain her acetaminophen, multivitamin with minerals, citalopram, cyclobenzaprine, ranitidine, metFORMIN, glucose blood, and ONETOUCH DELICA LANCETS FINE.  Meds ordered this encounter  Medications  . amoxicillin-clavulanate (AUGMENTIN) 875-125 MG per tablet    Sig: Take 1 tablet by mouth 2 (two) times daily.    Dispense:  20 tablet    Refill:  0  . fluticasone (FLONASE) 50 MCG/ACT nasal spray    Sig: Place 2 sprays into both nostrils daily.    Dispense:  16 g  Refill:  6    Problem List Items Addressed This Visit    None    Visit Diagnoses    Type 2 diabetes mellitus with hyperglycemia    -  Primary    Relevant Orders    Basic metabolic panel    Hemoglobin A1c    Hepatic function panel    Lipid panel    Microalbumin / creatinine urine ratio    POCT urinalysis dipstick    Acute frontal sinusitis, recurrence not specified        Relevant Medications    amoxicillin-clavulanate (AUGMENTIN) 875-125 MG per tablet    fluticasone (FLONASE) 50 MCG/ACT nasal spray       Follow-up: Return in about 6 months (around 05/14/2015), or if symptoms worsen or fail to improve, for f/u labs.  Garnet Koyanagi, DO

## 2014-11-12 ENCOUNTER — Telehealth: Payer: Self-pay | Admitting: Family Medicine

## 2014-11-12 MED ORDER — AMOXICILLIN 875 MG PO TABS: 875.0000 mg | ORAL_TABLET | Freq: Two times a day (BID) | ORAL | Status: DC

## 2014-11-12 NOTE — Telephone Encounter (Signed)
Called and left a VM advising Rx being faxed.      KP

## 2014-11-12 NOTE — Telephone Encounter (Signed)
Please advise      KP 

## 2014-11-12 NOTE — Telephone Encounter (Signed)
Amoxicillin 875 1 po bid x 10 days

## 2014-11-12 NOTE — Telephone Encounter (Signed)
Caller name: Merrill Deanda. Orsini Relationship to patient: self Can be reached: [phone] Pharmacy: [facility]  Reason for call: Pt states price of amoxicillin-clavulanate (AUGMENTIN) 875-125 MG per tablet is too high. Please send RX for one of the $4 generic antibiotics.  She will get the Flonase OTC.

## 2014-11-16 ENCOUNTER — Telehealth: Payer: Self-pay

## 2014-11-16 DIAGNOSIS — R809 Proteinuria, unspecified: Secondary | ICD-10-CM

## 2014-11-16 MED ORDER — SIMVASTATIN 20 MG PO TABS: 20.0000 mg | ORAL_TABLET | Freq: Every day | ORAL | Status: DC

## 2014-11-16 MED ORDER — LISINOPRIL 5 MG PO TABS
5.0000 mg | ORAL_TABLET | Freq: Every day | ORAL | Status: DC
Start: 2014-11-16 — End: 2015-01-07

## 2014-11-16 MED ORDER — METFORMIN HCL ER 500 MG PO TB24: 1000.0000 mg | ORAL_TABLET | Freq: Every day | ORAL | Status: DC

## 2014-11-16 NOTE — Telephone Encounter (Signed)
-----   Message from Rosalita Chessman, DO sent at 11/12/2014  9:26 AM EDT ----- hgba1c better but still high-- increase Metformin xr  500 mg 2 po qd #60  2 refills Cholesterol--- LDL goal < 70,  HDL >40,  TG < 150.  Diet and exercise will increase HDL and decrease LDL and TG.  Fish,  Fish Oil, Flaxseed oil will also help increase the HDL and decrease Triglycerides.   Recheck labs in 3 months--- start zocor 20 mg #30  2 refills 1 po qhs microalbumin elevated---- need 24 hour urine for protein and start lisinopril 5 mg 1 po qd #30  2 refills Ov in 3 months or sooner prn Lipid, hep, bmp, hgba1c.

## 2014-11-16 NOTE — Telephone Encounter (Signed)
Discussed with patient and she will stop in to pickup urine container, she has agreed to increase metformin and add Lisinopril and Zocor. Med's have been faxed and the orders are in.     [place]

## 2014-11-24 NOTE — Addendum Note (Signed)
Addended by: Modena Morrow D on: 11/24/2014 12:02 PM   Modules accepted: Orders

## 2014-11-25 LAB — PROTEIN, URINE, 24 HOUR
Protein, 24H Urine: 156 mg/d — ABNORMAL HIGH (ref <150)
Protein, Urine: 13 mg/dL (ref 5–24)

## 2014-12-03 ENCOUNTER — Encounter: Payer: Self-pay | Admitting: Gastroenterology

## 2014-12-03 ENCOUNTER — Ambulatory Visit (AMBULATORY_SURGERY_CENTER): Payer: Self-pay | Admitting: Gastroenterology

## 2014-12-03 VITALS — BP 104/75 | HR 70 | Temp 98.0°F | Resp 20 | Ht 66.0 in | Wt 237.0 lb

## 2014-12-03 DIAGNOSIS — K5730 Diverticulosis of large intestine without perforation or abscess without bleeding: Secondary | ICD-10-CM

## 2014-12-03 DIAGNOSIS — K648 Other hemorrhoids: Secondary | ICD-10-CM

## 2014-12-03 DIAGNOSIS — K921 Melena: Secondary | ICD-10-CM

## 2014-12-03 DIAGNOSIS — Z1211 Encounter for screening for malignant neoplasm of colon: Secondary | ICD-10-CM

## 2014-12-03 LAB — GLUCOSE, CAPILLARY
GLUCOSE-CAPILLARY: 88 mg/dL (ref 65–99)
GLUCOSE-CAPILLARY: 94 mg/dL (ref 65–99)

## 2014-12-03 MED ORDER — SODIUM CHLORIDE 0.9 % IV SOLN: 500.0000 mL | INTRAVENOUS | Status: DC

## 2014-12-03 NOTE — Progress Notes (Signed)
No problems noted in the recovery room. maw 

## 2014-12-03 NOTE — Op Note (Signed)
[facility]  [address] [place], [id number]   COLONOSCOPY PROCEDURE REPORT  PATIENT: Felicia Solis, Felicia Solis  MR#: [id number] BIRTHDATE: 10/25/1965 , 74  yrs. old GENDER: female ENDOSCOPIST: Inda Castle, MD REFERRED NL:[id number] [place], DO PROCEDURE DATE:  12/03/2014 PROCEDURE:   Colonoscopy, screening First Screening Colonoscopy - Avg.  risk and is 50 yrs.  old or older Yes.  Prior Negative Screening - Now for repeat screening. N/A  History of Adenoma - Now for follow-up colonoscopy & has been > or = to 3 yrs.  N/A  Recommend repeat exam, <10 yrs? No ASA CLASS:   Class II INDICATIONS:Colorectal Neoplasm Risk Assessment for this procedure is average risk and anal bleeding. MEDICATIONS: Monitored anesthesia care and Propofol 250 mg IV  DESCRIPTION OF PROCEDURE:   After the risks benefits and alternatives of the procedure were thoroughly explained, informed consent was obtained.  The digital rectal exam revealed no abnormalities of the rectum.   The LB Olympus Loaner [id number] endoscope was introduced through the anus and advanced to the cecum, which was identified by both the appendix and ileocecal valve. No adverse events experienced.   The quality of the prep was (Suprep was used) excellent.  The instrument was then slowly withdrawn as the colon was fully examined. Estimated blood loss is zero unless otherwise noted in this procedure report.      COLON FINDINGS: There was mild diverticulosis noted in the descending colon.   Internal hemorrhoids were found.   The examination was otherwise normal.  Retroflexed views revealed no abnormalities. The time to cecum = 2.3 Withdrawal time = 6:15   The scope was withdrawn and the procedure completed. COMPLICATIONS: There were no immediate complications.  ENDOSCOPIC IMPRESSION: 1.   Mild diverticulosis was noted in the descending colon 2.   Internal hemorrhoids - source for limited bleeding 3.   The examination was  otherwise normal  RECOMMENDATIONS: Continue current colorectal screening recommendations for "routine risk" patients with a repeat colonoscopy in 10 years. hemorrhoidal suppositories as needed eSigned:  Inda Castle, MD 12/03/2014 3:59 PM   cc:

## 2014-12-03 NOTE — Progress Notes (Signed)
To recovery, report to Willis, RN, VSS 

## 2014-12-03 NOTE — Patient Instructions (Signed)
YOU HAD AN ENDOSCOPIC PROCEDURE TODAY AT [facility]:   Refer to the procedure report that was given to you for any specific questions about what was found during the examination.  If the procedure report does not answer your questions, please call your gastroenterologist to clarify.  If you requested that your care partner not be given the details of your procedure findings, then the procedure report has been included in a sealed envelope for you to review at your convenience later.  YOU SHOULD EXPECT: Some feelings of bloating in the abdomen. Passage of more gas than usual.  Walking can help get rid of the air that was put into your GI tract during the procedure and reduce the bloating. If you had a lower endoscopy (such as a colonoscopy or flexible sigmoidoscopy) you may notice spotting of blood in your stool or on the toilet paper. If you underwent a bowel prep for your procedure, you may not have a normal bowel movement for a few days.  Please Note:  You might notice some irritation and congestion in your nose or some drainage.  This is from the oxygen used during your procedure.  There is no need for concern and it should clear up in a day or so.  SYMPTOMS TO REPORT IMMEDIATELY:   Following lower endoscopy (colonoscopy or flexible sigmoidoscopy):  Excessive amounts of blood in the stool  Significant tenderness or worsening of abdominal pains  Swelling of the abdomen that is new, acute  Fever of 100F or higher   For urgent or emergent issues, a gastroenterologist can be reached at any hour by calling [phone].   DIET: Your first meal following the procedure should be a small meal and then it is ok to progress to your normal diet. Heavy or fried foods are harder to digest and may make you feel nauseous or bloated.  Likewise, meals heavy in dairy and vegetables can increase bloating.  Drink plenty of fluids but you should avoid alcoholic beverages for 24  hours.  ACTIVITY:  You should plan to take it easy for the rest of today and you should NOT DRIVE or use heavy machinery until tomorrow (because of the sedation medicines used during the test).    FOLLOW UP: Our staff will call the number listed on your records the next business day following your procedure to check on you and address any questions or concerns that you may have regarding the information given to you following your procedure. If we do not reach you, we will leave a message.  However, if you are feeling well and you are not experiencing any problems, there is no need to return our call.  We will assume that you have returned to your regular daily activities without incident.  If any biopsies were taken you will be contacted by phone or by letter within the next 1-3 weeks.  Please call us at [phone] if you have not heard about the biopsies in 3 weeks.    SIGNATURES/CONFIDENTIALITY: You and/or your care partner have signed paperwork which will be entered into your electronic medical record.  These signatures attest to the fact that that the information above on your After Visit Summary has been reviewed and is understood.  Full responsibility of the confidentiality of this discharge information lies with you and/or your care-partner.    Handouts were given to your care partner on hemorrhoids, diverticulosis,and a high fiber diet with liberal fluid intake. You blood sugar was  94 in the recovery room. You may resume your current medications today. Please call if any questions or concerns.

## 2014-12-06 ENCOUNTER — Telehealth: Payer: Self-pay | Admitting: *Deleted

## 2014-12-06 NOTE — Telephone Encounter (Signed)
Name identifier, left message, follow-up 

## 2014-12-13 ENCOUNTER — Ambulatory Visit (INDEPENDENT_AMBULATORY_CARE_PROVIDER_SITE_OTHER): Payer: Self-pay | Admitting: Medical

## 2014-12-13 ENCOUNTER — Encounter: Payer: Self-pay | Admitting: Medical

## 2014-12-13 VITALS — BP 129/73 | HR 80 | Temp 98.5°F | Ht 66.75 in | Wt 238.0 lb

## 2014-12-13 DIAGNOSIS — J301 Allergic rhinitis due to pollen: Secondary | ICD-10-CM

## 2014-12-13 DIAGNOSIS — J309 Allergic rhinitis, unspecified: Secondary | ICD-10-CM | POA: Insufficient documentation

## 2014-12-13 DIAGNOSIS — J209 Acute bronchitis, unspecified: Secondary | ICD-10-CM

## 2014-12-13 MED ORDER — BENZONATATE 100 MG PO CAPS: 100.0000 mg | ORAL_CAPSULE | Freq: Three times a day (TID) | ORAL | Status: DC | PRN

## 2014-12-13 MED ORDER — LORATADINE 10 MG PO TABS: 10.0000 mg | ORAL_TABLET | Freq: Every day | ORAL | Status: DC

## 2014-12-13 MED ORDER — CEPHALEXIN 500 MG PO CAPS: 500.0000 mg | ORAL_CAPSULE | Freq: Two times a day (BID) | ORAL | Status: DC

## 2014-12-13 MED ORDER — TRIAMCINOLONE ACETONIDE 55 MCG/ACT NA AERO: 2.0000 | INHALATION_SPRAY | Freq: Every day | NASAL | Status: DC

## 2014-12-13 NOTE — Progress Notes (Signed)
Pre visit review using our clinic review tool, if applicable. No additional management support is needed unless otherwise documented below in the visit note. 

## 2014-12-13 NOTE — Assessment & Plan Note (Signed)
Rx  claritin 10 mg q day. Rx nasacort. Benzonatate for cough.

## 2014-12-13 NOTE — Assessment & Plan Note (Signed)
Possible early. Making cephalexin available if worsens.

## 2014-12-13 NOTE — Patient Instructions (Signed)
Allergic rhinitis Rx  claritin 10 mg q day. Rx nasacort. Benzonatate for cough.   Acute bronchitis Possible early. Making cephalexin available if worsens.    Follow up in 2 wks or as needed

## 2014-12-13 NOTE — Progress Notes (Signed)
Subjective:    Patient ID: Felicia Solis, female    DOB: [birth year], 49 y.o.   MRN: [id number]  HPI  Pt has been coughing for more than a week. She feels congested in her chest. Pt states 3 wks ago had a sinus infection. She took antibiotics for that. Took amoxicillin sinus got better. But recent feeling chest congested.  Pt does not smoke.   Pt has some sneezing. No itching eyes. No pnd. Pt has allergies all year round. Worse this time of year.Some chronic nasal congestion. Also has dry cough. Pt thinks this is related to allergies.    Review of Systems  Constitutional: Negative for fever, chills and fatigue.  HENT: Positive for congestion and postnasal drip. Negative for ear pain, hearing loss, mouth sores, sinus pressure and sneezing.   Respiratory: Positive for cough. Negative for chest tightness, shortness of breath and wheezing.        Pt feels like she needs to cough up some mucous.  Cardiovascular: Negative for chest pain and palpitations.  Musculoskeletal: Negative for back pain.  Neurological: Negative for dizziness, speech difficulty, weakness, numbness and headaches.  Hematological: Negative for adenopathy. Does not bruise/bleed easily.  Psychiatric/Behavioral: Negative for behavioral problems and confusion.     Past Medical History  Diagnosis Date  . Angina     pt states she has occasional chest pains no other sxs.. seen in ed  at  [facility] .. all tsts negative.   . Asthma 5 yrs ago     told she had asthma.. does not remember doctors name.   . Shortness of breath     with exertion  . Tuberculosis     pt states she took 9 months of medicine for positive tb skin test.   . Anemia     anemia  when pregnant.  10 yrs ago.   Marland Kitchen Headache(784.0)     years ago told h/a s were migraines.   . Depression     takes citalopram  . Renal disorder     kidney stones  . Diabetes 2016  . Anxiety   . Hyperlipidemia   . Thyroid disease     History   Social History  .  Marital Status: Married    Spouse Name: N/A  . Number of Children: 3  . Years of Education: N/A   Occupational History  . CNA    Social History Main Topics  . Smoking status: Never Smoker   . Smokeless tobacco: Never Used  . Alcohol Use: No  . Drug Use: No  . Sexual Activity:    Partners: Male   Other Topics Concern  . Not on file   Social History Narrative    Past Surgical History  Procedure Laterality Date  . Endometrial ablation    . Tubal ligation    . Laparoscopy      years ago ..pt does not know where surgery was done ... states" my stomach would blow up then go down"  . Dilation and curettage of uterus  1999  . Thyroidectomy  09/28/2011    Procedure: THYROIDECTOMY;  Surgeon: Izora Gala, MD;  Location: [facility];  Service: ENT;  Laterality: Right;  RIGHT THYROIDECTOMY WITH FROZEN SECTION    Family History  Problem Relation Age of Onset  . Diabetes Mother   . Hypertension Mother   . Hyperlipidemia Mother   . Diabetes Sister   . Hypertension Father   . Hyperlipidemia Father   . Colon polyps  Father   . Ovarian cancer Maternal Grandmother   . Colon cancer Paternal Grandfather   . Breast cancer Maternal Aunt   . Anesthesia problems Neg Hx   . Hypotension Neg Hx   . Malignant hyperthermia Neg Hx   . Pseudochol deficiency Neg Hx   . Heart disease Other     early cad  . Colon polyps Father   . Diabetes Brother   . Esophageal cancer Neg Hx   . Gallbladder disease Mother   . Gallbladder disease Maternal Grandmother     No Known Allergies  Current Outpatient Prescriptions on File Prior to Visit  Medication Sig Dispense Refill  . acetaminophen (TYLENOL) 500 MG tablet Take 1,000 mg by mouth every 6 (six) hours as needed for mild pain.    . citalopram (CELEXA) 10 MG tablet 1 po qd 30 tablet 2  . glucose blood (ONETOUCH VERIO) test strip Onetouch Verio Check blood sugar twice daily 100 each 12  . lisinopril (PRINIVIL,ZESTRIL) 5 MG tablet Take 1 tablet (5 mg total)  by mouth daily. 30 tablet 2  . metFORMIN (GLUCOPHAGE XR) 500 MG 24 hr tablet Take 2 tablets (1,000 mg total) by mouth daily with breakfast. 60 tablet 2  . Multiple Vitamin (MULTIVITAMIN WITH MINERALS) TABS tablet Take 1 tablet by mouth daily.    Glory Rosebush DELICA LANCETS FINE MISC Check blood sugar twice daily 100 each 12  . ranitidine (ZANTAC) 300 MG tablet Take 1 tablet (300 mg total) by mouth at bedtime. 90 tablet 3  . simvastatin (ZOCOR) 20 MG tablet Take 1 tablet (20 mg total) by mouth at bedtime. 30 tablet 2   No current facility-administered medications on file prior to visit.    BP 129/73 mmHg  Pulse 80  Temp(Src) 98.5 F (36.9 C) (Oral)  Ht 5' 6.75" (1.695 m)  Wt 238 lb (107.956 kg)  BMI 37.58 kg/m2  SpO2 100%       Objective:   Physical Exam  General  Mental Status - Alert. General Appearance - Well groomed. Not in acute distress.  Skin Rashes- No Rashes.  HEENT Head- Normal. Ear Auditory Canal - Left- Normal. Right - Normal.Tympanic Membrane- Left- Normal. Right- Normal. Eye Sclera/Conjunctiva- Left- Normal. Right- Normal. Nose & Sinuses Nasal Mucosa- Left-  Boggy and Congested. Right-  Boggy and  Congested. No Bilateral maxillary and no frontal sinus pressure. Mouth & Throat Lips: Upper Lip- Normal: no dryness, cracking, pallor, cyanosis, or vesicular eruption. Lower Lip-Normal: no dryness, cracking, pallor, cyanosis or vesicular eruption. Buccal Mucosa- Bilateral- No Aphthous ulcers. Oropharynx- No Discharge or Erythema. +pnd Tonsils: Characteristics- Bilateral- No Erythema or Congestion. Size/Enlargement- Bilateral- No enlargement. Discharge- bilateral-None.  Neck Neck- Supple. No Masses.   Chest and Lung Exam Auscultation: Breath Sounds:-Clear even and unlabored.  Cardiovascular Auscultation:Rythm- Regular, rate and rhythm. Murmurs & Other Heart Sounds:Ausculatation of the heart reveal- No Murmurs.  Lymphatic Head & Neck General Head & Neck  Lymphatics: Bilateral: Description- No Localized lymphadenopathy.       Assessment & Plan:

## 2014-12-16 ENCOUNTER — Telehealth: Payer: Self-pay | Admitting: Medical

## 2014-12-16 MED ORDER — HYDROCODONE-HOMATROPINE 5-1.5 MG/5ML PO SYRP: 5.0000 mL | ORAL_SOLUTION | Freq: Three times a day (TID) | ORAL | Status: DC | PRN

## 2014-12-16 NOTE — Telephone Encounter (Signed)
Caller name: Maeghan Canny Relationship to patient: self Can be reached: [phone] Pharmacy: [facility] on [address]  Reason for call: Pt doesn't feel the medicine given is helping with her cough. Can you call in something different for her?

## 2014-12-16 NOTE — Telephone Encounter (Signed)
Rx printed for patient Hycodan. CAlled patient advised ready for pick up at front desk.

## 2014-12-20 ENCOUNTER — Telehealth: Payer: Self-pay | Admitting: Medical

## 2014-12-20 MED ORDER — MOMETASONE FUROATE 50 MCG/ACT NA SUSP: NASAL | Status: DC

## 2014-12-20 NOTE — Telephone Encounter (Signed)
Left message for patient that medication has been filled. Advised tha tNasocort and Flonase available OTC.

## 2015-01-07 ENCOUNTER — Encounter: Payer: Self-pay | Admitting: Family Medicine

## 2015-01-07 ENCOUNTER — Ambulatory Visit (INDEPENDENT_AMBULATORY_CARE_PROVIDER_SITE_OTHER): Payer: Self-pay | Admitting: Family Medicine

## 2015-01-07 VITALS — BP 124/90 | HR 103 | Temp 98.8°F | Ht 66.75 in | Wt 236.2 lb

## 2015-01-07 DIAGNOSIS — E785 Hyperlipidemia, unspecified: Secondary | ICD-10-CM

## 2015-01-07 DIAGNOSIS — R05 Cough: Secondary | ICD-10-CM

## 2015-01-07 DIAGNOSIS — R059 Cough, unspecified: Secondary | ICD-10-CM | POA: Insufficient documentation

## 2015-01-07 DIAGNOSIS — E1165 Type 2 diabetes mellitus with hyperglycemia: Secondary | ICD-10-CM

## 2015-01-07 DIAGNOSIS — IMO0002 Reserved for concepts with insufficient information to code with codable children: Secondary | ICD-10-CM

## 2015-01-07 MED ORDER — HYDROCODONE-HOMATROPINE 5-1.5 MG/5ML PO SYRP: 5.0000 mL | ORAL_SOLUTION | Freq: Three times a day (TID) | ORAL | Status: DC | PRN

## 2015-01-07 NOTE — Progress Notes (Signed)
Subjective:    Patient ID: TALAYSIA PINHEIRO, female    DOB: 1966-04-10, 49 y.o.   MRN: [id number]  HPI  Patient here c/o dry cough x several weeks.  She was seen previously for same thing and is no better.  No fever, no congestion- just a dry cough.  Pt states cough med does help.    Past Medical History  Diagnosis Date  . Angina     pt states she has occasional chest pains no other sxs.. seen in ed  at  [facility] .. all tsts negative.   . Asthma 5 yrs ago     told she had asthma.. does not remember doctors name.   . Shortness of breath     with exertion  . Tuberculosis     pt states she took 9 months of medicine for positive tb skin test.   . Anemia     anemia  when pregnant.  10 yrs ago.   Marland Kitchen Headache(784.0)     years ago told h/a s were migraines.   . Depression     takes citalopram  . Renal disorder     kidney stones  . Diabetes 2016  . Anxiety   . Hyperlipidemia   . Thyroid disease     Review of Systems  Constitutional: Negative for fever and chills.  HENT: Negative for congestion, postnasal drip, rhinorrhea and sinus pressure.   Respiratory: Positive for cough. Negative for chest tightness, shortness of breath and wheezing.   Cardiovascular: Negative for chest pain, palpitations and leg swelling.  Allergic/Immunologic: Negative for environmental allergies.  Psychiatric/Behavioral: Negative for decreased concentration. The patient is not nervous/anxious.     Current Outpatient Prescriptions on File Prior to Visit  Medication Sig Dispense Refill  . acetaminophen (TYLENOL) 500 MG tablet Take 1,000 mg by mouth every 6 (six) hours as needed for mild pain.    . citalopram (CELEXA) 10 MG tablet 1 po qd 30 tablet 2  . glucose blood (ONETOUCH VERIO) test strip Onetouch Verio Check blood sugar twice daily 100 each 12  . loratadine (CLARITIN) 10 MG tablet Take 1 tablet (10 mg total) by mouth daily. 30 tablet 0  . metFORMIN (GLUCOPHAGE XR) 500 MG 24 hr tablet Take 2 tablets  (1,000 mg total) by mouth daily with breakfast. 60 tablet 2  . mometasone (NASONEX) 50 MCG/ACT nasal spray 2 sprays each nostril q day 17 g 1  . Multiple Vitamin (MULTIVITAMIN WITH MINERALS) TABS tablet Take 1 tablet by mouth daily.    Glory Rosebush DELICA LANCETS FINE MISC Check blood sugar twice daily 100 each 12  . ranitidine (ZANTAC) 300 MG tablet Take 1 tablet (300 mg total) by mouth at bedtime. 90 tablet 3  . simvastatin (ZOCOR) 20 MG tablet Take 1 tablet (20 mg total) by mouth at bedtime. 30 tablet 2   No current facility-administered medications on file prior to visit.       Objective:    Physical Exam  Constitutional: She is oriented to person, place, and time. She appears well-developed and well-nourished.  HENT:  Head: Normocephalic and atraumatic.  Eyes: Conjunctivae and EOM are normal.  Neck: Normal range of motion. Neck supple. No JVD present. Carotid bruit is not present. No thyromegaly present.  Cardiovascular: Normal rate, regular rhythm and normal heart sounds.   No murmur heard. Pulmonary/Chest: Effort normal and breath sounds normal. No respiratory distress. She has no wheezes. She has no rales. She exhibits no tenderness.  Musculoskeletal:  She exhibits no edema or tenderness.  Neurological: She is alert and oriented to person, place, and time.  Psychiatric: She has a normal mood and affect. Her behavior is normal.    BP 124/90 mmHg  Pulse 103  Temp(Src) 98.8 F (37.1 C) (Oral)  Ht 5' 6.75" (1.695 m)  Wt 236 lb 3.2 oz (107.14 kg)  BMI 37.29 kg/m2  SpO2 98% Wt Readings from Last 3 Encounters:  01/07/15 236 lb 3.2 oz (107.14 kg)  12/13/14 238 lb (107.956 kg)  12/03/14 237 lb (107.502 kg)  pt was coughing uncontrollably when she first came in   Lab Results  Component Value Date   WBC 10.0 08/06/2014   HGB 13.8 08/06/2014   HCT 41.9 08/06/2014   PLT 343 08/06/2014   GLUCOSE 129* 11/11/2014   CHOL 170 11/11/2014   TRIG 120.0 11/11/2014   HDL 36.50*  11/11/2014   LDLCALC 110* 11/11/2014   ALT 26 11/11/2014   AST 22 11/11/2014   NA 138 11/11/2014   K 3.8 11/11/2014   CL 105 11/11/2014   CREATININE 0.53 11/11/2014   BUN 10 11/11/2014   CO2 28 11/11/2014   TSH 0.936 08/06/2014   HGBA1C 7.6* 11/11/2014   MICROALBUR 3.5* 11/11/2014       Assessment & Plan:   Problem List Items Addressed This Visit    Diabetes mellitus type II, uncontrolled   Relevant Orders   Basic metabolic panel   Hemoglobin A1c   Cough - Primary    ? FROM LISINOPRIL---- D/C ACE Pt will call if cough does not resolve  Then we can try cozaar 25 mg instead      Relevant Medications   HYDROcodone-homatropine (HYCODAN) 5-1.5 MG/5ML syrup    Other Visit Diagnoses    Hyperlipidemia        Relevant Orders    Hepatic function panel    Lipid panel       I have discontinued Ms. Kleinert's lisinopril, benzonatate, and cephALEXin. I am also having her maintain her acetaminophen, multivitamin with minerals, citalopram, ranitidine, glucose blood, ONETOUCH DELICA LANCETS FINE, metFORMIN, simvastatin, loratadine, mometasone, and HYDROcodone-homatropine.  Meds ordered this encounter  Medications  . HYDROcodone-homatropine (HYCODAN) 5-1.5 MG/5ML syrup    Sig: Take 5 mLs by mouth every 8 (eight) hours as needed for cough.    Dispense:  120 mL    Refill:  0     Garnet Koyanagi, DO

## 2015-01-07 NOTE — Patient Instructions (Signed)
Lisinopril tablets What is this medicine? LISINOPRIL (lyse IN oh pril) is an ACE inhibitor. This medicine is used to treat high blood pressure and heart failure. It is also used to protect the heart immediately after a heart attack. This medicine may be used for other purposes; ask your health care provider or pharmacist if you have questions. COMMON BRAND NAME(S): Prinivil, Zestril What should I tell my health care provider before I take this medicine? They need to know if you have any of these conditions: -diabetes -heart or blood vessel disease -immune system disease like lupus or scleroderma -kidney disease -low blood pressure -previous swelling of the tongue, face, or lips with difficulty breathing, difficulty swallowing, hoarseness, or tightening of the throat -an unusual or allergic reaction to lisinopril, other ACE inhibitors, insect venom, foods, dyes, or preservatives -pregnant or trying to get pregnant -breast-feeding How should I use this medicine? Take this medicine by mouth with a glass of water. Follow the directions on your prescription label. You may take this medicine with or without food. Take your medicine at regular intervals. Do not stop taking this medicine except on the advice of your doctor or health care professional. Talk to your pediatrician regarding the use of this medicine in children. Special care may be needed. While this drug may be prescribed for children as young as 30 years of age for selected conditions, precautions do apply. Overdosage: If you think you have taken too much of this medicine contact a poison control center or emergency room at once. NOTE: This medicine is only for you. Do not share this medicine with others. What if I miss a dose? If you miss a dose, take it as soon as you can. If it is almost time for your next dose, take only that dose. Do not take double or extra doses. What may interact with this medicine? -diuretics -lithium -NSAIDs,  medicines for pain and inflammation, like ibuprofen or naproxen -over-the-counter herbal supplements like hawthorn -potassium salts or potassium supplements -salt substitutes This list may not describe all possible interactions. Give your health care provider a list of all the medicines, herbs, non-prescription drugs, or dietary supplements you use. Also tell them if you smoke, drink alcohol, or use illegal drugs. Some items may interact with your medicine. What should I watch for while using this medicine? Visit your doctor or health care professional for regular check ups. Check your blood pressure as directed. Ask your doctor what your blood pressure should be, and when you should contact him or her. Call your doctor or health care professional if you notice an irregular or fast heart beat. Women should inform their doctor if they wish to become pregnant or think they might be pregnant. There is a potential for serious side effects to an unborn child. Talk to your health care professional or pharmacist for more information. Check with your doctor or health care professional if you get an attack of severe diarrhea, nausea and vomiting, or if you sweat a lot. The loss of too much body fluid can make it dangerous for you to take this medicine. You may get drowsy or dizzy. Do not drive, use machinery, or do anything that needs mental alertness until you know how this drug affects you. Do not stand or sit up quickly, especially if you are an older patient. This reduces the risk of dizzy or fainting spells. Alcohol can make you more drowsy and dizzy. Avoid alcoholic drinks. Avoid salt substitutes unless you are told  otherwise by your doctor or health care professional. Do not treat yourself for coughs, colds, or pain while you are taking this medicine without asking your doctor or health care professional for advice. Some ingredients may increase your blood pressure. What side effects may I notice from  receiving this medicine? Side effects that you should report to your doctor or health care professional as soon as possible: -abdominal pain with or without nausea or vomiting -allergic reactions like skin rash or hives, swelling of the hands, feet, face, lips, throat, or tongue -dark urine -difficulty breathing -dizzy, lightheaded or fainting spell -fever or sore throat -irregular heart beat, chest pain -pain or difficulty passing urine -redness, blistering, peeling or loosening of the skin, including inside the mouth -unusually weak -yellowing of the eyes or skin Side effects that usually do not require medical attention (report to your doctor or health care professional if they continue or are bothersome): -change in taste -cough -decreased sexual function or desire -headache -sun sensitivity -tiredness This list may not describe all possible side effects. Call your doctor for medical advice about side effects. You may report side effects to FDA at 1-800-FDA-1088. Where should I keep my medicine? Keep out of the reach of children. Store at room temperature between 15 and 30 degrees C (59 and 86 degrees F). Protect from moisture. Keep container tightly closed. Throw away any unused medicine after the expiration date. NOTE: This sheet is a summary. It may not cover all possible information. If you have questions about this medicine, talk to your doctor, pharmacist, or health care provider.  2015, [employer]vier/[employer] Standard. (2007-12-15 17:36:32)

## 2015-01-07 NOTE — Assessment & Plan Note (Signed)
?   FROM LISINOPRIL---- D/C ACE Pt will call if cough does not resolve  Then we can try cozaar 25 mg instead

## 2015-01-07 NOTE — Progress Notes (Signed)
Pre visit review using our clinic review tool, if applicable. No additional management support is needed unless otherwise documented below in the visit note. 

## 2015-02-02 DIAGNOSIS — R05 Cough: Secondary | ICD-10-CM

## 2015-02-02 DIAGNOSIS — E1165 Type 2 diabetes mellitus with hyperglycemia: Secondary | ICD-10-CM

## 2015-02-02 DIAGNOSIS — E785 Hyperlipidemia, unspecified: Secondary | ICD-10-CM

## 2015-02-04 ENCOUNTER — Telehealth: Payer: Self-pay | Admitting: Family Medicine

## 2015-02-04 DIAGNOSIS — F418 Other specified anxiety disorders: Secondary | ICD-10-CM

## 2015-02-04 MED ORDER — CITALOPRAM HYDROBROMIDE 10 MG PO TABS: ORAL_TABLET | ORAL | Status: DC

## 2015-02-04 MED ORDER — LOSARTAN POTASSIUM 25 MG PO TABS: 25.0000 mg | ORAL_TABLET | Freq: Every day | ORAL | Status: DC

## 2015-02-04 MED ORDER — METFORMIN HCL ER 500 MG PO TB24: 1000.0000 mg | ORAL_TABLET | Freq: Every day | ORAL | Status: DC

## 2015-02-04 NOTE — Telephone Encounter (Signed)
Cough - Primary     ? FROM LISINOPRIL---- D/C ACE Pt will call if cough does not resolve  Then we can try cozaar 25 mg instead

## 2015-02-04 NOTE — Telephone Encounter (Signed)
Faxed and the patient has been made aware.      KP

## 2015-02-04 NOTE — Telephone Encounter (Signed)
Caller name:Gelena Relationship to patient:self Can be reached:[phone] Pharmacy: [facility] Reason for call:Dr Etter Sjogren took her off the lisinopril.  It stopped the cough.

## 2015-03-01 ENCOUNTER — Ambulatory Visit: Payer: Self-pay

## 2015-03-09 ENCOUNTER — Encounter (HOSPITAL_BASED_OUTPATIENT_CLINIC_OR_DEPARTMENT_OTHER): Payer: Self-pay

## 2015-03-09 ENCOUNTER — Emergency Department (HOSPITAL_BASED_OUTPATIENT_CLINIC_OR_DEPARTMENT_OTHER): Payer: Self-pay

## 2015-03-09 ENCOUNTER — Emergency Department (HOSPITAL_BASED_OUTPATIENT_CLINIC_OR_DEPARTMENT_OTHER)
Admission: EM | Admit: 2015-03-09 | Discharge: 2015-03-09 | Disposition: A | Discharge status: Home / Self Care | Payer: Self-pay | Attending: Emergency Medicine | Admitting: Emergency Medicine

## 2015-03-09 ENCOUNTER — Encounter: Payer: Self-pay | Admitting: Medical

## 2015-03-09 ENCOUNTER — Ambulatory Visit (INDEPENDENT_AMBULATORY_CARE_PROVIDER_SITE_OTHER): Payer: Self-pay | Admitting: Medical

## 2015-03-09 ENCOUNTER — Encounter: Payer: Self-pay | Admitting: Family Medicine

## 2015-03-09 VITALS — BP 118/80 | HR 87 | Temp 98.3°F | Resp 16 | Ht 66.75 in | Wt 234.8 lb

## 2015-03-09 DIAGNOSIS — Z8679 Personal history of other diseases of the circulatory system: Secondary | ICD-10-CM | POA: Insufficient documentation

## 2015-03-09 DIAGNOSIS — R197 Diarrhea, unspecified: Secondary | ICD-10-CM

## 2015-03-09 DIAGNOSIS — M25562 Pain in left knee: Secondary | ICD-10-CM

## 2015-03-09 DIAGNOSIS — Z862 Personal history of diseases of the blood and blood-forming organs and certain disorders involving the immune mechanism: Secondary | ICD-10-CM | POA: Insufficient documentation

## 2015-03-09 DIAGNOSIS — Z79899 Other long term (current) drug therapy: Secondary | ICD-10-CM | POA: Insufficient documentation

## 2015-03-09 DIAGNOSIS — E119 Type 2 diabetes mellitus without complications: Secondary | ICD-10-CM | POA: Insufficient documentation

## 2015-03-09 DIAGNOSIS — J45909 Unspecified asthma, uncomplicated: Secondary | ICD-10-CM | POA: Insufficient documentation

## 2015-03-09 DIAGNOSIS — R079 Chest pain, unspecified: Secondary | ICD-10-CM

## 2015-03-09 DIAGNOSIS — E785 Hyperlipidemia, unspecified: Secondary | ICD-10-CM | POA: Insufficient documentation

## 2015-03-09 DIAGNOSIS — Z87448 Personal history of other diseases of urinary system: Secondary | ICD-10-CM | POA: Insufficient documentation

## 2015-03-09 DIAGNOSIS — F329 Major depressive disorder, single episode, unspecified: Secondary | ICD-10-CM | POA: Insufficient documentation

## 2015-03-09 DIAGNOSIS — F419 Anxiety disorder, unspecified: Secondary | ICD-10-CM | POA: Insufficient documentation

## 2015-03-09 DIAGNOSIS — R0789 Other chest pain: Secondary | ICD-10-CM | POA: Insufficient documentation

## 2015-03-09 LAB — COMPREHENSIVE METABOLIC PANEL
ALT: 32 U/L (ref 14–54)
AST: 33 U/L (ref 15–41)
Albumin: 4.1 g/dL (ref 3.5–5.0)
Alkaline Phosphatase: 63 U/L (ref 38–126)
Anion gap: 8 (ref 5–15)
BUN: 10 mg/dL (ref 6–20)
CHLORIDE: 103 mmol/L (ref 101–111)
CO2: 28 mmol/L (ref 22–32)
Calcium: 9.9 mg/dL (ref 8.9–10.3)
Creatinine, Ser: 0.56 mg/dL (ref 0.44–1.00)
Glucose, Bld: 189 mg/dL — ABNORMAL HIGH (ref 65–99)
POTASSIUM: 3.8 mmol/L (ref 3.5–5.1)
SODIUM: 139 mmol/L (ref 135–145)
Total Bilirubin: 0.5 mg/dL (ref 0.3–1.2)
Total Protein: 7.1 g/dL (ref 6.5–8.1)

## 2015-03-09 LAB — CBC WITH DIFFERENTIAL/PLATELET
BASOS ABS: 0 10*3/uL (ref 0.0–0.1)
BASOS PCT: 0 %
Eosinophils Absolute: 0.2 10*3/uL (ref 0.0–0.7)
Eosinophils Relative: 2 %
HEMATOCRIT: 37.6 % (ref 36.0–46.0)
HEMOGLOBIN: 12.5 g/dL (ref 12.0–15.0)
LYMPHS PCT: 41 %
Lymphs Abs: 3.5 10*3/uL (ref 0.7–4.0)
MCH: 28.2 pg (ref 26.0–34.0)
MCHC: 33.2 g/dL (ref 30.0–36.0)
MCV: 84.9 fL (ref 78.0–100.0)
Monocytes Absolute: 0.6 10*3/uL (ref 0.1–1.0)
Monocytes Relative: 7 %
NEUTROS ABS: 4.2 10*3/uL (ref 1.7–7.7)
NEUTROS PCT: 50 %
Platelets: 287 10*3/uL (ref 150–400)
RBC: 4.43 MIL/uL (ref 3.87–5.11)
RDW: 12.7 % (ref 11.5–15.5)
WBC: 8.5 10*3/uL (ref 4.0–10.5)

## 2015-03-09 LAB — D-DIMER, QUANTITATIVE: D-Dimer, Quant: <0.27 ug/mL-FEU (ref 0.00–0.48)

## 2015-03-09 LAB — LIPASE, BLOOD: LIPASE: 25 U/L (ref 22–51)

## 2015-03-09 LAB — TROPONIN I

## 2015-03-09 MED ORDER — IBUPROFEN 800 MG PO TABS: 800.0000 mg | ORAL_TABLET | Freq: Once | ORAL | Status: DC

## 2015-03-09 MED ORDER — FAMOTIDINE 20 MG PO TABS
20.0000 mg | ORAL_TABLET | Freq: Once | ORAL | Status: AC
  Administered 2015-03-09: 20 mg via ORAL
  Filled 2015-03-09: qty 1

## 2015-03-09 NOTE — ED Notes (Signed)
Reports was driving today and had sudden onset of left sided chest pain that brought her to tears and she had to pull over.  Sent to ER by PCP.

## 2015-03-09 NOTE — Progress Notes (Signed)
Subjective:    Patient ID: DOT SPLINTER, female    DOB: Jul 07, 1965, 49 y.o.   MRN: [id number]  HPI  Pt in states early today at work she had some mid chest pain. Mild at first then got more intense. Then it radiated to her left axillary area and to her back. This pain felt deep. No sob. Pain came on around 2 pm. This pain lasted total 7-8 minutes. Pt was driving when pain came on. Pt states very low level faint discomfort."wierd feeling" now.  Pt states at height of pain. Enough to bring tears to her eyes.   Pt states last Friday she had some mild left calf pain. And pain went to back of her leg.  Also mentioned some mild loose stools on and off. Since Monday. 4 loose stools on Monday. On Tuesday 3 times. None today. No suspicious preceding foods.   Lipid panel ordered but not done. a1-c 3 months ago 7.6. She is obese. bp controlled. Mom had stents placed in her 40's  No report of any skin rash in area of pain.    Review of Systems  Constitutional: Negative for fever, chills, diaphoresis, activity change and fatigue.  Respiratory: Negative for cough, chest tightness and shortness of breath.   Cardiovascular: Positive for chest pain. Negative for palpitations and leg swelling.  Gastrointestinal: Positive for diarrhea. Negative for nausea, vomiting and abdominal pain.  Musculoskeletal: Negative for neck pain and neck stiffness.  Neurological: Negative for dizziness, tremors, seizures, syncope, facial asymmetry, speech difficulty, weakness, light-headedness, numbness and headaches.  Psychiatric/Behavioral: Negative for behavioral problems, confusion and agitation. The patient is not nervous/anxious.       Past Medical History  Diagnosis Date  . Angina     pt states she has occasional chest pains no other sxs.. seen in ed  at  [facility] .. all tsts negative.   . Asthma 5 yrs ago     told she had asthma.. does not remember doctors name.   . Shortness of breath     with exertion   . Tuberculosis     pt states she took 9 months of medicine for positive tb skin test.   . Anemia     anemia  when pregnant.  10 yrs ago.   Marland Kitchen Headache(784.0)     years ago told h/a s were migraines.   . Depression     takes citalopram  . Renal disorder     kidney stones  . Diabetes 2016  . Anxiety   . Hyperlipidemia   . Thyroid disease     Social History   Social History  . Marital Status: Married    Spouse Name: N/A  . Number of Children: 3  . Years of Education: N/A   Occupational History  . CNA    Social History Main Topics  . Smoking status: Never Smoker   . Smokeless tobacco: Never Used  . Alcohol Use: No  . Drug Use: No  . Sexual Activity:    Partners: Male   Other Topics Concern  . Not on file   Social History Narrative    Past Surgical History  Procedure Laterality Date  . Endometrial ablation    . Tubal ligation    . Laparoscopy      years ago ..pt does not know where surgery was done ... states" my stomach would blow up then go down"  . Dilation and curettage of uterus  1999  . Thyroidectomy  09/28/2011    Procedure: THYROIDECTOMY;  Surgeon: Izora Gala, MD;  Location: [facility];  Service: ENT;  Laterality: Right;  RIGHT THYROIDECTOMY WITH FROZEN SECTION    Family History  Problem Relation Age of Onset  . Diabetes Mother   . Hypertension Mother   . Hyperlipidemia Mother   . Diabetes Sister   . Hypertension Father   . Hyperlipidemia Father   . Colon polyps Father   . Ovarian cancer Maternal Grandmother   . Colon cancer Paternal Grandfather   . Breast cancer Maternal Aunt   . Anesthesia problems Neg Hx   . Hypotension Neg Hx   . Malignant hyperthermia Neg Hx   . Pseudochol deficiency Neg Hx   . Heart disease Other     early cad  . Colon polyps Father   . Diabetes Brother   . Esophageal cancer Neg Hx   . Gallbladder disease Mother   . Gallbladder disease Maternal Grandmother     Allergies  Allergen Reactions  . Lisinopril Cough     Current Outpatient Prescriptions on File Prior to Visit  Medication Sig Dispense Refill  . acetaminophen (TYLENOL) 500 MG tablet Take 1,000 mg by mouth every 6 (six) hours as needed for mild pain.    . citalopram (CELEXA) 10 MG tablet 1 po qd 30 tablet 2  . glucose blood (ONETOUCH VERIO) test strip Onetouch Verio Check blood sugar twice daily 100 each 12  . losartan (COZAAR) 25 MG tablet Take 1 tablet (25 mg total) by mouth daily. 30 tablet 2  . metFORMIN (GLUCOPHAGE XR) 500 MG 24 hr tablet Take 2 tablets (1,000 mg total) by mouth daily with breakfast. 60 tablet 2  . Multiple Vitamin (MULTIVITAMIN WITH MINERALS) TABS tablet Take 1 tablet by mouth daily.    Glory Rosebush DELICA LANCETS FINE MISC Check blood sugar twice daily 100 each 12  . ranitidine (ZANTAC) 300 MG tablet Take 1 tablet (300 mg total) by mouth at bedtime. 90 tablet 3  . simvastatin (ZOCOR) 20 MG tablet Take 1 tablet (20 mg total) by mouth at bedtime. 30 tablet 2   No current facility-administered medications on file prior to visit.    BP 118/80 mmHg  Pulse 87  Temp(Src) 98.3 F (36.8 C) (Oral)  Resp 16  Ht 5' 6.75" (1.695 m)  Wt 234 lb 12.8 oz (106.505 kg)  BMI 37.07 kg/m2  SpO2 98%       Objective:   Physical Exam  General Mental Status- Alert. General Appearance- Not in acute distress.   Skin General: Color- Normal Color. Moisture- Normal Moisture.  Neck Carotid Arteries- Normal color. Moisture- Normal Moisture. No carotid bruits. No JVD.  Chest and Lung Exam Auscultation: Breath Sounds:-Normal. CTA.  Cardiovascular Auscultation:Rythm- Regular, Rate and Rhythm. Murmurs & Other Heart Sounds:Auscultation of the heart reveals- No Murmurs.  Abdomen Inspection:-Inspeection Normal. Palpation/Percussion:Note:No mass. Palpation and Percussion of the abdomen reveal- Non Tender, Non Distended + BS, no rebound or guarding.  Neurologic Cranial Nerve exam:- CN III-XII intact(No nystagmus), symmetric  smile. Strength:- 5/5 equal and symmetric strength both upper and lower extremities.  Lower ext- negative homans sign.No swelling left lower leg.       Assessment & Plan:  ekg showed nonspecific t abormality. Similar to 2105 ekg. Today I thought q in lead III.  I have talked with the ED physician and notified her of your recent chest pain today(some faintly present now)  and leg pain this past Friday. With your risk factors and your  mom cardiac history it is best to be evaluated down stairs now.  Follow up here after follow up in ED.  At end briefly discussed her diarrhea. Pt speculates in past she thinks she may have had c dif before. She is not sure. 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 So put in order for stool panel kit. Any further watery stools turn in kit by Friday. In that event let us know that she is dropping off kit. Then I would rx flagyl pending stool panel results.  Currently advised bland diet guidelines,hydration and immodium otc.

## 2015-03-09 NOTE — ED Notes (Signed)
Patient transported to X-ray 

## 2015-03-09 NOTE — Discharge Instructions (Signed)
Chest Pain (Nonspecific) It is often hard to give a diagnosis for the cause of chest pain. There is always a chance that your pain could be related to something serious, such as a heart attack or a blood clot in the lungs. You need to follow up with your doctor. HOME CARE  If antibiotic medicine was given, take it as directed by your doctor. Finish the medicine even if you start to feel better.  For the next few days, avoid activities that bring on chest pain. Continue physical activities as told by your doctor.  Do not use any tobacco products. This includes cigarettes, chewing tobacco, and e-cigarettes.  Avoid drinking alcohol.  Only take medicine as told by your doctor.  Follow your doctor's suggestions for more testing if your chest pain does not go away.  Keep all doctor visits you made. GET HELP IF:  Your chest pain does not go away, even after treatment.  You have a rash with blisters on your chest.  You have a fever. GET HELP RIGHT AWAY IF:   You have more pain or pain that spreads to your arm, neck, jaw, back, or belly (abdomen).  You have shortness of breath.  You cough more than usual or cough up blood.  You have very bad back or belly pain.  You feel sick to your stomach (nauseous) or throw up (vomit).  You have very bad weakness.  You pass out (faint).  You have chills. This is an emergency. Do not wait to see if the problems will go away. Call your local emergency services (911 in [place] Do not drive yourself to the hospital. MAKE SURE YOU:   Understand these instructions.  Will watch your condition.  Will get help right away if you are not doing well or get worse. Document Released: 11/28/2007 Document Revised: 06/16/2013 Document Reviewed: 11/28/2007 [facility] Patient Information 2015 [place], [place]. This information is not intended to replace advice given to you by your health care provider. Make sure you discuss any questions you have with your  health care provider. Suspected Gastroesophageal Reflux Disease, Adult Gastroesophageal reflux disease (GERD) happens when acid from your stomach flows up into the esophagus. When acid comes in contact with the esophagus, the acid causes soreness (inflammation) in the esophagus. Over time, GERD may create small holes (ulcers) in the lining of the esophagus. CAUSES   Increased body weight. This puts pressure on the stomach, making acid rise from the stomach into the esophagus.  Smoking. This increases acid production in the stomach.  Drinking alcohol. This causes decreased pressure in the lower esophageal sphincter (valve or ring of muscle between the esophagus and stomach), allowing acid from the stomach into the esophagus.  Late evening meals and a full stomach. This increases pressure and acid production in the stomach.  A malformed lower esophageal sphincter. Sometimes, no cause is found. SYMPTOMS   Burning pain in the lower part of the mid-chest behind the breastbone and in the mid-stomach area. This may occur twice a week or more often.  Trouble swallowing.  Sore throat.  Dry cough.  Asthma-like symptoms including chest tightness, shortness of breath, or wheezing. DIAGNOSIS  Your caregiver may be able to diagnose GERD based on your symptoms. In some cases, X-rays and other tests may be done to check for complications or to check the condition of your stomach and esophagus. TREATMENT  Your caregiver may recommend over-the-counter or prescription medicines to help decrease acid production. Ask your caregiver before starting  or adding any new medicines.  HOME CARE INSTRUCTIONS   Change the factors that you can control. Ask your caregiver for guidance concerning weight loss, quitting smoking, and alcohol consumption.  Avoid foods and drinks that make your symptoms worse, such as:  Caffeine or alcoholic drinks.  Chocolate.  Peppermint or mint flavorings.  Garlic and  onions.  Spicy foods.  Citrus fruits, such as oranges, lemons, or limes.  Tomato-based foods such as sauce, chili, salsa, and pizza.  Fried and fatty foods.  Avoid lying down for the 3 hours prior to your bedtime or prior to taking a nap.  Eat small, frequent meals instead of large meals.  Wear loose-fitting clothing. Do not wear anything tight around your waist that causes pressure on your stomach.  Raise the head of your bed 6 to 8 inches with wood blocks to help you sleep. Extra pillows will not help.  Only take over-the-counter or prescription medicines for pain, discomfort, or fever as directed by your caregiver.  Do not take aspirin, ibuprofen, or other nonsteroidal anti-inflammatory drugs (NSAIDs). SEEK IMMEDIATE MEDICAL CARE IF:   You have pain in your arms, neck, jaw, teeth, or back.  Your pain increases or changes in intensity or duration.  You develop nausea, vomiting, or sweating (diaphoresis).  You develop shortness of breath, or you faint.  Your vomit is green, yellow, black, or looks like coffee grounds or blood.  Your stool is red, bloody, or black. These symptoms could be signs of other problems, such as heart disease, gastric bleeding, or esophageal bleeding. MAKE SURE YOU:   Understand these instructions.  Will watch your condition.  Will get help right away if you are not doing well or get worse. Document Released: 03/21/2005 Document Revised: 09/03/2011 Document Reviewed: 12/29/2010 [facility] Patient Information 2015 [place], [place]. This information is not intended to replace advice given to you by your health care provider. Make sure you discuss any questions you have with your health care provider. Education on Metabolic Syndrome, Adult Metabolic syndrome describes a group of risk factors for heart disease and diabetes. This syndrome has other names including Insulin Resistance Syndrome. The more risk factors you have, the higher your risk of having  a heart attack, stroke, or developing diabetes. These risk factors include:  High blood sugar.  High blood triglyceride (a fat found in the blood) level.  High blood pressure.  Abdominal obesity (your extra weight is around your waist instead of your hips).  Low levels of high-density lipoprotein, HDL (good blood cholesterol). If you have any three of these risk factors, you have metabolic syndrome. If you have even one of these factors, you should make lifestyle changes to improve your health in order to prevent serious health diseases.  In people with metabolic syndrome, the cells do not respond properly to insulin. This can lead to high levels of glucose in the blood, which can interfere with normal body processes. Eventually, this can cause high blood pressure and higher fat levels in the blood, and inflammation of your blood vessels. The result can be heart disease and stroke.  CAUSES   Eating a diet rich in calories and saturated fat.  Too little physical activity.  Being overweight. Other underlying causes are:  Family history (genetics).  Ethnicity (South Asians are at a higher risk).  Older age (your chances of developing metabolic syndrome are higher as you grow older).  Insulin resistance. SYMPTOMS  By itself, metabolic syndrome has no symptoms. However, you might have  symptoms of diabetes (high blood sugar) or high blood pressure, such as:  Increased thirst, urination, and tiredness.  Dizzy spells.  Dull headaches that are unusual for you.  Blurred vision.  Nosebleeds. DIAGNOSIS  Your caregiver may make a diagnosis of metabolic syndrome if you have at least three of these factors:  If you are overweight mostly around the waist. This means a waistline greater than 40" in men and more than 35" in women. The waistline limits are 31 to 35 inches for women and 37 to 39 inches for men. In those who have certain genetic risk factors, such as having a family history of  diabetes or being of Asian descent.  If you have a blood pressure of 130/85 mm Hg or more, or if you are being treated for high blood pressure.  If your blood triglyceride level is 150 mg/dL or more, or you are being treated for high levels of triglyceride.  If the level of HDL in your blood is below 40 mg/dL in men, less than 50 mg/dL in women, or you are receiving treatment for low levels of HDL.  If the level of sugar in your blood is high with fasting blood sugar level of 110 mg/dL or more, or you are under treatment for diabetes. TREATMENT  Your caregiver may have you make lifestyle changes, which may include:  Exercise.  Losing weight.  Maintaining a healthy diet.  Quitting smoking. The lifestyle changes listed above are key in reducing your risk for heart disease and stroke. Medicines may also be prescribed to help your body respond to insulin better and to reduce your blood pressure and blood fat levels. Aspirin may be recommended to reduce risks of heart disease or stroke.  HOME CARE INSTRUCTIONS   Exercise.  Measure your waist at regular intervals just above the hipbones after you have breathed out.  Maintain a healthy diet.  Eat fruits, such as apples, oranges, and pears.  Eat vegetables.  Eat legumes, such as kidney beans, peas, and lentils.  Eat food rich in soluble fiber, such as whole grain cereal, oatmeal, and oat bran.  Use olive or safflower oils and avoid saturated fats.  Eat nuts.  Limit the amount of salt you eat or add to food.  Limit the amount of alcohol you drink.  Include fish in your diet, if possible.  Stop smoking if you are a smoker.  Maintain regular follow-up appointments.  Follow your caregiver's advice. SEEK MEDICAL CARE IF:   You feel very tired or fatigued.  You develop excessive thirst.  You pass large quantities of urine.  You are putting on weight around your waist rather than losing weight.  You develop headaches  over and over again.  You have off-and-on dizzy spells. SEEK IMMEDIATE MEDICAL CARE IF:   You develop nosebleeds.  You develop sudden blurred vision.  You develop sudden dizzy spells.  You develop chest pains, trouble breathing, or feel an abnormal or irregular heart beat.  You have a fainting episode.  You develop any sudden trouble speaking and/or swallowing.  You develop sudden weakness in one arm and/or one leg. MAKE SURE YOU:   Understand these instructions.  Will watch your condition.  Will get help right away if you are not doing well or get worse. Document Released: 09/18/2007 Document Revised: 10/26/2013 Document Reviewed: 09/18/2007 [facility] Patient Information 2015 [place], [place]. This information is not intended to replace advice given to you by your health care provider. Make sure you discuss  any questions you have with your health care provider. ° °

## 2015-03-09 NOTE — Progress Notes (Signed)
Patient called in complaining of episode she had today. States while driving she had a sharp pain in her left chest that radiated around to her back.. States it lasted a few minutes. Had to pull over. Says she has also had diarrhea in the past few weeks. Appointment scheduled for patient on ES schedule.

## 2015-03-09 NOTE — ED Provider Notes (Signed)
CSN: [id number]     Arrival date & time 03/09/15  1705 History   First MD Initiated Contact with Patient 03/09/15 1714     Chief Complaint  Patient presents with  . Chest Pain     (Consider location/radiation/quality/duration/timing/severity/associated sxs/prior Treatment) HPI Patient was driving and had a sudden onset of left-sided chest pain at 2 PM. She reports is extremely sharp and shot through her chest across to her underarm and around to her back. It brought tears to her eyes. She denies that she was short of breath with it. She pulled over and tried to walk it off a little bit. She reports a waves of intense pain kept coming back up. She states it took about 7-10 minutes and then it eased off quite a bit. Now she senses a slight discomfort that feels more like a difficulty to move her air. She reports however she doesn't feel specifically short of breath. She denies history of similar symptoms. She does note that she has been having pain in the left calf over about the past week. She has been attributed that to her shoes. She does not have tobacco use history no recent travel history patient is not on birth control. Family history negative for DVT or PE. Past Medical History  Diagnosis Date  . Angina     pt states she has occasional chest pains no other sxs.. seen in ed  at  [facility] .. all tsts negative.   . Asthma 5 yrs ago     told she had asthma.. does not remember doctors name.   . Shortness of breath     with exertion  . Tuberculosis     pt states she took 9 months of medicine for positive tb skin test.   . Anemia     anemia  when pregnant.  10 yrs ago.   Marland Kitchen Headache(784.0)     years ago told h/a s were migraines.   . Depression     takes citalopram  . Renal disorder     kidney stones  . Diabetes 2016  . Anxiety   . Hyperlipidemia   . Thyroid disease    Past Surgical History  Procedure Laterality Date  . Endometrial ablation    . Tubal ligation    . Laparoscopy       years ago ..pt does not know where surgery was done ... states" my stomach would blow up then go down"  . Dilation and curettage of uterus  1999  . Thyroidectomy  09/28/2011    Procedure: THYROIDECTOMY;  Surgeon: Izora Gala, MD;  Location: [place];  Service: ENT;  Laterality: Right;  RIGHT THYROIDECTOMY WITH FROZEN SECTION   Family History  Problem Relation Age of Onset  . Diabetes Mother   . Hypertension Mother   . Hyperlipidemia Mother   . Diabetes Sister   . Hypertension Father   . Hyperlipidemia Father   . Colon polyps Father   . Ovarian cancer Maternal Grandmother   . Colon cancer Paternal Grandfather   . Breast cancer Maternal Aunt   . Anesthesia problems Neg Hx   . Hypotension Neg Hx   . Malignant hyperthermia Neg Hx   . Pseudochol deficiency Neg Hx   . Heart disease Other     early cad  . Colon polyps Father   . Diabetes Brother   . Esophageal cancer Neg Hx   . Gallbladder disease Mother   . Gallbladder disease Maternal Grandmother  Social History  Substance Use Topics  . Smoking status: Never Smoker   . Smokeless tobacco: Never Used  . Alcohol Use: No   OB History    No data available     Review of Systems  10 Systems reviewed and are negative for acute change except as noted in the HPI.   Allergies  Lisinopril  Home Medications   Prior to Admission medications   Medication Sig Start Date End Date Taking? Authorizing Provider  citalopram (CELEXA) 10 MG tablet 1 po qd 02/04/15  Yes Alferd Apa Lowne, DO  glucose blood (ONETOUCH VERIO) test strip Onetouch Verio Check blood sugar twice daily 08/26/14  Yes Yvonne R Lowne, DO  losartan (COZAAR) 25 MG tablet Take 1 tablet (25 mg total) by mouth daily. 02/04/15  Yes Rosalita Chessman, DO  metFORMIN (GLUCOPHAGE XR) 500 MG 24 hr tablet Take 2 tablets (1,000 mg total) by mouth daily with breakfast. 02/04/15  Yes Rosalita Chessman, DO  Multiple Vitamin (MULTIVITAMIN WITH MINERALS) TABS tablet Take 1 tablet by mouth daily.    Yes Historical Provider, MD  [facility] DELICA LANCETS FINE MISC Check blood sugar twice daily 08/26/14  Yes Alferd Apa Lowne, DO  ranitidine (ZANTAC) 300 MG tablet Take 1 tablet (300 mg total) by mouth at bedtime. 08/09/14  Yes Yvonne R Lowne, DO  simvastatin (ZOCOR) 20 MG tablet Take 1 tablet (20 mg total) by mouth at bedtime. 11/16/14  Yes Rosalita Chessman, DO  acetaminophen (TYLENOL) 500 MG tablet Take 1,000 mg by mouth every 6 (six) hours as needed for mild pain.    Historical Provider, MD   BP 125/77 mmHg  Pulse 82  Temp(Src) 98.2 F (36.8 C) (Oral)  Resp 18  Ht 5\' 6"  (1.676 m)  Wt 234 lb (106.142 kg)  BMI 37.79 kg/m2  SpO2 98% Physical Exam  Constitutional: She is oriented to person, place, and time. She appears well-developed and well-nourished.  HENT:  Head: Normocephalic and atraumatic.  Eyes: EOM are normal. Pupils are equal, round, and reactive to light.  Neck: Neck supple.  Cardiovascular: Normal rate, regular rhythm, normal heart sounds and intact distal pulses.   Pulmonary/Chest: Effort normal and breath sounds normal.  Abdominal: Soft. Bowel sounds are normal. She exhibits no distension. There is no tenderness.  Musculoskeletal: Normal range of motion. She exhibits tenderness. She exhibits no edema.  Mild left calf and popliteal fossa tenderness without edema.  Neurological: She is alert and oriented to person, place, and time. She has normal strength. Coordination normal. GCS eye subscore is 4. GCS verbal subscore is 5. GCS motor subscore is 6.  Skin: Skin is warm, dry and intact.  Psychiatric: She has a normal mood and affect.    ED Course  Procedures (including critical care time) Labs Review Labs Reviewed  COMPREHENSIVE METABOLIC PANEL - Abnormal; Notable for the following:    Glucose, Bld 189 (*)    All other components within normal limits  LIPASE, BLOOD  TROPONIN I  CBC WITH DIFFERENTIAL/PLATELET  D-DIMER, QUANTITATIVE (NOT AT [facility])    Imaging Review Dg  Chest 2 View  03/09/2015   CLINICAL DATA:  LEFT side chest pain radiating around to LEFT breast, asthma, TB, diabetes mellitus  EXAM: CHEST  2 VIEW  COMPARISON:  06/22/2014  FINDINGS: Upper normal heart size.  Tortuous aorta.  Mediastinal contours and pulmonary vascularity normal.  Chronic peribronchial thickening.  No pulmonary infiltrate, pleural effusion or pneumothorax.  Bones unremarkable.  IMPRESSION: Mild chronic bronchitic  changes.   Electronically Signed   By: Lavonia Dana M.D.   On: 03/09/2015 18:08   I have personally reviewed and evaluated these images and lab results as part of my medical decision-making.   EKG Interpretation   Date/Time:  Wednesday March 09 2015 17:15:41 EDT Ventricular Rate:  79 PR Interval:  158 QRS Duration: 84 QT Interval:  386 QTC Calculation: 442 R Axis:   99 Text Interpretation:  Normal sinus rhythm Rightward axis Borderline ECG  agree. no change from old Confirmed by Johnney Killian, MD, Jeannie Done [phone]) on  03/09/2015 5:45:28 PM      MDM   Final diagnoses:  Other chest pain   Patient had acute onset of a lancinating quality of chest pain without associated symptoms. It has since resolved. Patient has a negative d-dimer and no PE risk factors. She has had prior cardiac workup. The pain history is atypical for cardiac ischemic event. EKG does not show acute changes and troponin is negative. At this time I do feel most likely etiology is GI in nature such as GERD or esophageal spasm. The patient will be started on Prilosec and advised to follow-up with her family physician. She is counseled on signs and symptoms for return. She is also counseled on healthy lifestyle to minimize general risk factors for cardiac disease and hypertension.    Charlesetta Shanks, MD 03/09/15 [phone]

## 2015-03-09 NOTE — Patient Instructions (Addendum)
I have talked with the ED physician and notified her of your recent chest pain today(some faintly present now)  and leg pain this past Friday. With your risk factors and your mom cardiac history it is best to be evaluated down stairs now.  Follow up here after follow up in ED.  At end briefly discussed her diarrhea. Pt speculates in past she thinks she may have had c dif before. She is not sure. 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 So put in order for stool panel kit. Any further watery stools turn in kit by Friday. In that event let us know that she is dropping off kit. Then I would rx flagyl pending stool panel results.  Currently advised bland diet guidelines,hydration and immodium otc.    

## 2015-03-09 NOTE — Progress Notes (Signed)
Pre visit review using our clinic review tool, if applicable. No additional management support is needed unless otherwise documented below in the visit note. 

## 2015-03-21 ENCOUNTER — Ambulatory Visit: Payer: Self-pay | Admitting: Family Medicine

## 2015-03-22 ENCOUNTER — Encounter: Payer: Self-pay | Admitting: Family Medicine

## 2015-03-22 ENCOUNTER — Ambulatory Visit (INDEPENDENT_AMBULATORY_CARE_PROVIDER_SITE_OTHER): Payer: Self-pay | Admitting: Family Medicine

## 2015-03-22 VITALS — BP 120/78 | HR 83 | Temp 98.0°F | Ht 66.75 in | Wt 240.0 lb

## 2015-03-22 DIAGNOSIS — F411 Generalized anxiety disorder: Secondary | ICD-10-CM

## 2015-03-22 DIAGNOSIS — R1013 Epigastric pain: Secondary | ICD-10-CM

## 2015-03-22 MED ORDER — CITALOPRAM HYDROBROMIDE 20 MG PO TABS: 20.0000 mg | ORAL_TABLET | Freq: Every day | ORAL | Status: DC

## 2015-03-22 MED ORDER — PANTOPRAZOLE SODIUM 40 MG PO TBEC: 40.0000 mg | DELAYED_RELEASE_TABLET | Freq: Every day | ORAL | Status: DC

## 2015-03-22 NOTE — Progress Notes (Signed)
Patient ID: Felicia Solis, female   DOB: 05/29/1966, 49 y.o.   MRN: [id number]   Subjective:    Patient ID: Felicia Solis, female    DOB: [birth year], 49 y.o.   MRN: [id number]  Chief Complaint  Patient presents with  . Follow-up    HPI Patient is in today for f/u er for abd pain and chest pain.   Pain has resolved since being in ER.    She would also like to change the celexa to 20 mg .  It is cheaper than 10 mg.     Past Medical History  Diagnosis Date  . Angina     pt states she has occasional chest pains no other sxs.. seen in ed  at  [facility] .. all tsts negative.   . Asthma 5 yrs ago     told she had asthma.. does not remember doctors name.   . Shortness of breath     with exertion  . Tuberculosis     pt states she took 9 months of medicine for positive tb skin test.   . Anemia     anemia  when pregnant.  10 yrs ago.   Marland Kitchen Headache(784.0)     years ago told h/a s were migraines.   . Depression     takes citalopram  . Renal disorder     kidney stones  . Diabetes 2016  . Anxiety   . Hyperlipidemia   . Thyroid disease     Past Surgical History  Procedure Laterality Date  . Endometrial ablation    . Tubal ligation    . Laparoscopy      years ago ..pt does not know where surgery was done ... states" my stomach would blow up then go down"  . Dilation and curettage of uterus  1999  . Thyroidectomy  09/28/2011    Procedure: THYROIDECTOMY;  Surgeon: Izora Gala, MD;  Location: [place];  Service: ENT;  Laterality: Right;  RIGHT THYROIDECTOMY WITH FROZEN SECTION    Family History  Problem Relation Age of Onset  . Diabetes Mother   . Hypertension Mother   . Hyperlipidemia Mother   . Diabetes Sister   . Hypertension Father   . Hyperlipidemia Father   . Colon polyps Father   . Ovarian cancer Maternal Grandmother   . Colon cancer Paternal Grandfather   . Breast cancer Maternal Aunt   . Anesthesia problems Neg Hx   . Hypotension Neg Hx   . Malignant hyperthermia Neg Hx    . Pseudochol deficiency Neg Hx   . Heart disease Other     early cad  . Colon polyps Father   . Diabetes Brother   . Esophageal cancer Neg Hx   . Gallbladder disease Mother   . Gallbladder disease Maternal Grandmother     Social History   Social History  . Marital Status: Married    Spouse Name: N/A  . Number of Children: 3  . Years of Education: N/A   Occupational History  . CNA    Social History Main Topics  . Smoking status: Never Smoker   . Smokeless tobacco: Never Used  . Alcohol Use: No  . Drug Use: No  . Sexual Activity:    Partners: Male   Other Topics Concern  . Not on file   Social History Narrative    Outpatient Prescriptions Prior to Visit  Medication Sig Dispense Refill  . acetaminophen (TYLENOL) 500 MG tablet Take 1,000 mg  by mouth every 6 (six) hours as needed for mild pain.    Marland Kitchen glucose blood (ONETOUCH VERIO) test strip Onetouch Verio Check blood sugar twice daily 100 each 12  . losartan (COZAAR) 25 MG tablet Take 1 tablet (25 mg total) by mouth daily. 30 tablet 2  . metFORMIN (GLUCOPHAGE XR) 500 MG 24 hr tablet Take 2 tablets (1,000 mg total) by mouth daily with breakfast. 60 tablet 2  . Multiple Vitamin (MULTIVITAMIN WITH MINERALS) TABS tablet Take 1 tablet by mouth daily.    [facility] DELICA LANCETS FINE MISC Check blood sugar twice daily 100 each 12  . simvastatin (ZOCOR) 20 MG tablet Take 1 tablet (20 mg total) by mouth at bedtime. 30 tablet 2  . citalopram (CELEXA) 10 MG tablet 1 po qd 30 tablet 2  . ranitidine (ZANTAC) 300 MG tablet Take 1 tablet (300 mg total) by mouth at bedtime. 90 tablet 3   No facility-administered medications prior to visit.    Allergies  Allergen Reactions  . Lisinopril Cough    Review of Systems  Constitutional: Negative for fever and malaise/fatigue.  HENT: Negative for congestion.   Eyes: Negative for discharge.  Respiratory: Negative for shortness of breath.   Cardiovascular: Negative for chest pain,  palpitations and leg swelling.  Gastrointestinal: Negative for nausea and abdominal pain.  Genitourinary: Negative for dysuria.  Musculoskeletal: Negative for falls.  Skin: Negative for rash.  Neurological: Negative for loss of consciousness and headaches.  Endo/Heme/Allergies: Negative for environmental allergies.  Psychiatric/Behavioral: Negative for depression. The patient is not nervous/anxious.        Objective:    Physical Exam  Constitutional: She is oriented to person, place, and time. She appears well-developed and well-nourished.  HENT:  Head: Normocephalic and atraumatic.  Eyes: Conjunctivae and EOM are normal.  Neck: Normal range of motion. Neck supple. No JVD present. Carotid bruit is not present. No thyromegaly present.  Cardiovascular: Normal rate, regular rhythm and normal heart sounds.   No murmur heard. Pulmonary/Chest: Effort normal and breath sounds normal. No respiratory distress. She has no wheezes. She has no rales. She exhibits no tenderness.  Abdominal: She exhibits no mass. There is tenderness. There is no rebound and no guarding.    Musculoskeletal: She exhibits no edema.  Neurological: She is alert and oriented to person, place, and time.  Psychiatric: She has a normal mood and affect.  Nursing note and vitals reviewed.   BP 120/78 mmHg  Pulse 83  Temp(Src) 98 F (36.7 C) (Oral)  Ht 5' 6.75" (1.695 m)  Wt 240 lb (108.863 kg)  BMI 37.89 kg/m2  SpO2 98% Wt Readings from Last 3 Encounters:  03/22/15 240 lb (108.863 kg)  03/09/15 234 lb (106.142 kg)  03/09/15 234 lb 12.8 oz (106.505 kg)     Lab Results  Component Value Date   WBC 8.5 03/09/2015   HGB 12.5 03/09/2015   HCT 37.6 03/09/2015   PLT 287 03/09/2015   GLUCOSE 189* 03/09/2015   CHOL 170 11/11/2014   TRIG 120.0 11/11/2014   HDL 36.50* 11/11/2014   LDLCALC 110* 11/11/2014   ALT 32 03/09/2015   AST 33 03/09/2015   NA 139 03/09/2015   K 3.8 03/09/2015   CL 103 03/09/2015    CREATININE 0.56 03/09/2015   BUN 10 03/09/2015   CO2 28 03/09/2015   TSH 0.936 08/06/2014   HGBA1C 7.6* 11/11/2014   MICROALBUR 3.5* 11/11/2014    Lab Results  Component Value Date  TSH 0.936 08/06/2014   Lab Results  Component Value Date   WBC 8.5 03/09/2015   HGB 12.5 03/09/2015   HCT 37.6 03/09/2015   MCV 84.9 03/09/2015   PLT 287 03/09/2015   Lab Results  Component Value Date   NA 139 03/09/2015   K 3.8 03/09/2015   CO2 28 03/09/2015   GLUCOSE 189* 03/09/2015   BUN 10 03/09/2015   CREATININE 0.56 03/09/2015   BILITOT 0.5 03/09/2015   ALKPHOS 63 03/09/2015   AST 33 03/09/2015   ALT 32 03/09/2015   PROT 7.1 03/09/2015   ALBUMIN 4.1 03/09/2015   CALCIUM 9.9 03/09/2015   ANIONGAP 8 03/09/2015   GFR 156.98 11/11/2014   Lab Results  Component Value Date   CHOL 170 11/11/2014   Lab Results  Component Value Date   HDL 36.50* 11/11/2014   Lab Results  Component Value Date   LDLCALC 110* 11/11/2014   Lab Results  Component Value Date   TRIG 120.0 11/11/2014   Lab Results  Component Value Date   CHOLHDL 5 11/11/2014   Lab Results  Component Value Date   HGBA1C 7.6* 11/11/2014       Assessment & Plan:   Problem List Items Addressed This Visit    Midepigastric pain    protonix 40 mg daily Check h pylori        Other Visit Diagnoses    Generalized anxiety disorder    -  Primary    Relevant Medications    citalopram (CELEXA) 20 MG tablet    Dyspepsia        Relevant Orders    Ambulatory referral to Gastroenterology    H. pylori breath test       I have discontinued Felicia Solis's ranitidine and citalopram. I am also having her start on citalopram and pantoprazole. Additionally, I am having her maintain her acetaminophen, multivitamin with minerals, glucose blood, ONETOUCH DELICA LANCETS FINE, simvastatin, losartan, and metFORMIN.  Meds ordered this encounter  Medications  . citalopram (CELEXA) 20 MG tablet    Sig: Take 1 tablet (20 mg  total) by mouth daily.    Dispense:  30 tablet    Refill:  11  . pantoprazole (PROTONIX) 40 MG tablet    Sig: Take 1 tablet (40 mg total) by mouth daily.    Dispense:  30 tablet    Refill:  [place], DO

## 2015-03-22 NOTE — Patient Instructions (Signed)

## 2015-03-22 NOTE — Assessment & Plan Note (Signed)
protonix 40 mg daily Check h pylori

## 2015-03-22 NOTE — Progress Notes (Signed)
Pre visit review using our clinic review tool, if applicable. No additional management support is needed unless otherwise documented below in the visit note. 

## 2015-03-23 ENCOUNTER — Other Ambulatory Visit (INDEPENDENT_AMBULATORY_CARE_PROVIDER_SITE_OTHER): Payer: Self-pay

## 2015-03-23 DIAGNOSIS — R1013 Epigastric pain: Secondary | ICD-10-CM

## 2015-03-24 LAB — H. PYLORI BREATH TEST: H. PYLORI BREATH TEST: NOT DETECTED

## 2015-03-31 ENCOUNTER — Encounter: Payer: Self-pay | Admitting: Gastroenterology

## 2015-03-31 ENCOUNTER — Ambulatory Visit (INDEPENDENT_AMBULATORY_CARE_PROVIDER_SITE_OTHER): Payer: Self-pay | Admitting: Gastroenterology

## 2015-03-31 VITALS — BP 114/68 | HR 70 | Ht 66.0 in | Wt 237.4 lb

## 2015-03-31 DIAGNOSIS — Z791 Long term (current) use of non-steroidal anti-inflammatories (NSAID): Secondary | ICD-10-CM

## 2015-03-31 DIAGNOSIS — R0789 Other chest pain: Secondary | ICD-10-CM

## 2015-03-31 DIAGNOSIS — R1310 Dysphagia, unspecified: Secondary | ICD-10-CM

## 2015-03-31 DIAGNOSIS — R1013 Epigastric pain: Secondary | ICD-10-CM

## 2015-03-31 DIAGNOSIS — K591 Functional diarrhea: Secondary | ICD-10-CM

## 2015-03-31 NOTE — Patient Instructions (Signed)
You have been scheduled for an endoscopy. Please follow written instructions given to you at your visit today. If you use inhalers (even only as needed), please bring them with you on the day of your procedure. Your physician has requested that you go to www.startemmi.com and enter the access code given to you at your visit today. This web site gives a general overview about your procedure. However, you should still follow specific instructions given to you by our office regarding your preparation for the procedure.  Follow a lactose free diet  Continue pantoprazole 40 mg daily  Avoid Ibuprofen and goody powder as much as possible.    Lactose-Free Diet, Adult If you have lactose intolerance, you are not able to digest lactose. Lactose is a natural sugar found mainly in milk and milk products. You may need to avoid all foods and beverages that contain lactose. A lactose-free diet can help you do this.  WHAT DO I NEED TO KNOW ABOUT THIS DIET?  Do not consume foods, beverages, vitamins, minerals, or medicines with lactose. Read ingredients lists carefully.  Look for the words "lactose-free" on labels.  Use lactase enzyme drops or tablets as directed by your health care provider.  Use lactose-free milk or a milk alternative, such as soy milk, for drinking and cooking.  Make sure you get enough calcium and vitamin D in your diet. A lactose-free eating plan can be lacking in these important nutrients.  Take calcium and vitamin D supplements as directed by your health care provider. Talk to your provider about supplements if you are not able to get enough calcium and vitamin D from food. WHICH FOODS HAVE LACTOSE? Lactose is found in:   Milk and foods made from milk.  Yogurt.   Cheese.  Butter.   Margarine.   Sour cream.   Cream.   Whipped toppings and nondairy creamers.  Ice cream and other milk-based desserts. Lactose is also found in foods or products made with milk or  milk ingredients. To find out whether a food contains milk or a milk ingredient, look at the ingredients list. Avoid foods with the statement "May contain milk" and foods that contain:   Butter.   Cream.  Milk.  Milk solids.  Milk powder.   Whey.  Curd.  Caseinate.  Lactose.  Lactalbumin.  Lactoglobulin. WHAT ARE SOME ALTERNATIVES TO MILK AND FOODS MADE WITH MILK PRODUCTS?  Lactose-free milk.  Soy milk with added calcium and vitamin D.  Almond, coconut, or rice milk with added calcium and vitamin D. Note that these are low in protein.   Soy products, such as soy yogurt, soy cheese, soy ice cream, and soy-based sour cream. WHICH FOODS CAN I EAT? Grains Breads and rolls made without milk, such as [place], [place], or [place] bread, bagels, pita, and [employer]. Corn tortillas, corn meal, grits, and polenta. Crackers without lactose or milk solids, such as soda crackers and graham crackers. Cooked or dry cereals without lactose or milk solids. Pasta, quinoa, couscous, barley, oats, bulgur, farro, rice, wild rice, or other grains prepared without milk or lactose. Plain popcorn.  Vegetables Fresh, frozen, and canned vegetables without cheese, cream, or butter sauces. Fruits All fresh, canned, frozen, or dried fruits that are not processed with lactose. Meats and Other Protein Sources Plain beef, chicken, fish, [place], lamb, veal, pork, wild game, or ham. Kosher-prepared meat products. Strained or junior meats that do not contain milk. Eggs. Soy meat substitutes. Beans, lentils, and hummus. Tofu. Nuts and seeds. Peanut  or other nut butters without lactose. Soups, casseroles, and mixed dishes without cheese, cream, or milk.  Dairy Lactose-free milk. Soy, rice, or almond milk with added calcium and vitamin D. Soy cheese and yogurt. Beverages Carbonated drinks. Tea. Coffee, freeze-dried coffee, and some instant coffees. Fruit and vegetable juices.  Condiments Soy sauce. Carob  powder. Olives. Gravy made with water. Baker's cocoa. Angie Fava. Pure seasonings and spices. Ketchup. Mustard. Bouillon. Broth.  Sweets and Desserts Water and fruit ices. Gelatin. Cookies, pies, or cakes made from allowed ingredients, such as angel food cake. Pudding made with water or a milk substitute. Lactose-free tofu desserts. Soy, coconut milk, or rice-milk-based frozen desserts. Sugar. Honey. Jam, jelly, and marmalade. Molasses. Pure sugar candy. Dark chocolate without milk. Marshmallows.  Fats and Oils Margarines and salad dressings that do not contain milk. Berniece Salines. Vegetable oils. Shortening. Mayonnaise. Soy or coconut-based cream.  The items listed above may not be a complete list of recommended foods or beverages. Contact your dietitian for more options.  WHICH FOODS ARE NOT RECOMMENDED? Grains Breads and rolls that contain milk. Toaster pastries. Muffins, biscuits, waffles, cornbread, and pancakes. These can be prepared at home, commercial, or from mixes. Sweet rolls, donuts, English muffins, fry bread, lefse, flour tortillas with lactose, or [place] toast made with milk or milk ingredients. Crackers that contain lactose. Corn curls. Cooked or dry cereals with lactose. Vegetables Creamed or breaded vegetables. Vegetables in a cheese or butter sauce or with lactose-containing margarines. Instant potatoes. [place] fries. Scalloped or au gratin potatoes.  Fruits None.  Meats and Other Protein Sources Scrambled eggs, omelets, and souffles that contain milk. Creamed or breaded meat, fish, chicken, or [place]. Sausage products, such as wieners and liver sausage. Cold cuts that contain milk solids. Cheese, cottage cheese, ricotta cheese, and cheese spreads. Lasagna and macaroni and cheese. Pizza. Peanut or other nut butters with added milk solids. Casseroles or mixed dishes containing milk or cheese.  Dairy All dairy products, including milk, goat's milk, buttermilk, kefir, acidophilus milk, flavored  milk, evaporated milk, condensed milk, dulce de [place], eggnog, yogurt, cheese, and cheese spreads.  Beverages Hot chocolate. Cocoa with lactose. Instant iced teas. Powdered fruit drinks. Smoothies made with milk or yogurt.  Condiments Chewing gum that has lactose. Cocoa that has lactose. Spice blends if they contain milk products. Artificial sweeteners that contain lactose. Nondairy creamers.  Sweets and Desserts Ice cream, ice milk, gelato, sherbet, and frozen yogurt. Custard, pudding, and mousse. Cake, cream pies, cookies, and other desserts containing milk, cream, cream cheese, or milk chocolate. Pie crust made with milk-containing margarine or butter. Reduced-calorie desserts made with a sugar substitute that contains lactose. Toffee and butterscotch. Milk, white, or dark chocolate that contains milk. Fudge. Caramel.  Fats and Oils Margarines and salad dressings that contain milk or cheese. Cream. Half and half. Cream cheese. Sour cream. Chip dips made with sour cream or yogurt.  The items listed above may not be a complete list of foods and beverages to avoid. Contact your dietitian for more information. AM I GETTING ENOUGH CALCIUM? Calcium is found in many foods that contain lactose and is important for bone health. The amount of calcium you need depends on your age:   Adults younger than 50 years: 1000 mg of calcium a day.  Adults older than 50 years: 1200 mg of calcium a day. If you are not getting enough calcium, other calcium sources include:   Orange juice with calcium added. There are 300-350 mg of calcium in 1  cup of orange juice.   Sardines with edible bones. There are 325 mg of calcium in 3 oz of sardines.   Calcium-fortified soy milk. There are 300-400 mg of calcium in 1 cup of calcium-fortified soy milk.  Calcium-fortified rice or almond milk. There are 300 mg of calcium in 1 cup of calcium-fortified rice or almond milk.  Canned salmon with edible bones. There are 180 mg  of calcium in 3 oz of canned salmon with edible bones.   Calcium-fortified breakfast cereals. There are [phone] mg of calcium in calcium-fortified breakfast cereals.   Tofu set with calcium sulfate. There are 250 mg of calcium in  cup of tofu set with calcium sulfate.  Spinach, cooked. There are 145 mg of calcium in  cup of cooked spinach.  Edamame, cooked. There are 130 mg of calcium in  cup of cooked edamame.  Collard greens, cooked. There are 125 mg of calcium in  cup of cooked collard greens.  Kale, frozen or cooked. There are 90 mg of calcium in  cup of cooked or frozen kale.   Almonds. There are 95 mg of calcium in  cup of almonds.  Broccoli, cooked. There are 60 mg of calcium in 1 cup of cooked broccoli.   This information is not intended to replace advice given to you by your health care provider. Make sure you discuss any questions you have with your health care provider.   Document Released: 12/01/2001 Document Revised: 10/26/2014 Document Reviewed: 09/11/2013 Elsevier Interactive Patient Education [employer].

## 2015-04-10 DIAGNOSIS — Z791 Long term (current) use of non-steroidal anti-inflammatories (NSAID): Secondary | ICD-10-CM | POA: Insufficient documentation

## 2015-04-10 DIAGNOSIS — R1013 Epigastric pain: Secondary | ICD-10-CM | POA: Insufficient documentation

## 2015-04-10 DIAGNOSIS — K591 Functional diarrhea: Secondary | ICD-10-CM | POA: Insufficient documentation

## 2015-04-10 DIAGNOSIS — R1310 Dysphagia, unspecified: Secondary | ICD-10-CM | POA: Insufficient documentation

## 2015-04-10 DIAGNOSIS — R0789 Other chest pain: Secondary | ICD-10-CM | POA: Insufficient documentation

## 2015-04-10 DIAGNOSIS — R079 Chest pain, unspecified: Secondary | ICD-10-CM | POA: Insufficient documentation

## 2015-04-10 NOTE — Progress Notes (Signed)
     03/31/2015 Felicia Solis [id number] [birth year]   History of Present Illness:  This is a pleasant 49 year old female who is known to Dr. Deatra Ina just recently for colonoscopy.  This was performed in 11/2014 at which time she was found to have mild diverticulosis and internal hemorrhoids.    She presents to our office today with complaints of chest pain.  Chest pain was really just one occurrence on 9/14 and has resolved.  She reports that she was driving and had a sudden onset of left-sided chest pain. She reports that it was extremely sharp and shot through her chest across to her underarm and around to her back. It brought tears to her eyes. She denied that she was short of breath with it. She pulled over and tried to walk it off a little bit.  She states it took about 7-10 minutes and then it eased off quite a bit, but a slight discomfort persisted.  She went to the ED which time EKG and troponin x 1 were negative.  Lipase normal as well as CBC and CMP.  Also reports some dysphagia to foods such as bread.  Has some epigastric pain as well. She admits that she uses Goody powders quite frequently and was using Ibuprofen daily for quite some time as well.  She is on pantoprazole 40 mg daily, which was just started and changed from Zantac on 9/27 at visit with her PCP.   She also complains of intermittent diarrhea for quite some time.  Upon questioning she admits that she does eat a lot of dairy such as ice cream, etc, and that she does notice that diarrhea seems to occur after eating those items.  TSH was normal 07/2014.  Current Medications, Allergies, Past Medical History, Past Surgical History, Family History and Social History were reviewed in [employer] record.   Physical Exam: BP 114/68 mmHg  Pulse 70  Ht 5\' 6"  (1.676 m)  Wt 237 lb 6.4 oz (107.684 kg)  BMI 38.34 kg/m2 General: Well developed black female in no acute distress Head: Normocephalic and  atraumatic Eyes:  Sclerae anicteric, conjunctiva pink  Ears: Normal auditory acuity Lungs: Clear throughout to auscultation Heart: Regular rate and rhythm Abdomen: Soft, non-distended.  Normal bowel sounds.  Mild epigastric TTP without R/R/G. Musculoskeletal: Symmetrical with no gross deformities  Extremities: No edema  Neurological: Alert oriented x 4, grossly non-focal Psychological:  Alert and cooperative. Normal mood and affect  Assessment and Recommendations: -Atypical chest pain, dysphagia, epigastric pain:  Negative cardiac evaluation.  Patient was using a lot of Ibuprofen and Goody powders.  She needs to avoid these, but will also plan for EGD with possible dilation with Dr. Silverio Decamp to rule out ulcer disease, esophagitis, etc.  The risks, benefits, and alternatives to EGD were discussed with the patient and she consents to proceed.  Continue pantoprazole 40 mg daily for now.  -Diarrhea:  Intermittent.  Notices that it is worse with dairy products but she does eat them often.  Will try a lactose free diet for 2-3 weeks to see if this improves her symptoms.

## 2015-04-11 ENCOUNTER — Ambulatory Visit: Payer: Self-pay

## 2015-04-11 NOTE — Progress Notes (Signed)
Reviewed and agree with documentation and assessment and plan. K. Veena Chanse Kagel , MD   

## 2015-04-26 ENCOUNTER — Encounter: Payer: Self-pay | Admitting: Gastroenterology

## 2015-04-26 ENCOUNTER — Ambulatory Visit (AMBULATORY_SURGERY_CENTER): Payer: Self-pay | Admitting: Gastroenterology

## 2015-04-26 VITALS — BP 126/83 | HR 71 | Temp 98.0°F | Resp 21 | Ht 66.0 in | Wt 237.0 lb

## 2015-04-26 DIAGNOSIS — R1310 Dysphagia, unspecified: Secondary | ICD-10-CM

## 2015-04-26 DIAGNOSIS — Q394 Esophageal web: Secondary | ICD-10-CM

## 2015-04-26 LAB — GLUCOSE, CAPILLARY
Glucose-Capillary: 91 mg/dL (ref 65–99)
Glucose-Capillary: 74 mg/dL (ref 65–99)

## 2015-04-26 MED ORDER — SODIUM CHLORIDE 0.9 % IV SOLN: 500.0000 mL | INTRAVENOUS | Status: DC

## 2015-04-26 NOTE — Progress Notes (Signed)
Called to room to assist during endoscopic procedure.  Patient ID and intended procedure confirmed with present staff. Received instructions for my participation in the procedure from the performing physician.  

## 2015-04-26 NOTE — Patient Instructions (Addendum)
YOU HAD AN ENDOSCOPIC PROCEDURE TODAY AT [facility]:   Refer to the procedure report that was given to you for any specific questions about what was found during the examination.  If the procedure report does not answer your questions, please call your gastroenterologist to clarify.  If you requested that your care partner not be given the details of your procedure findings, then the procedure report has been included in a sealed envelope for you to review at your convenience later.  YOU SHOULD EXPECT: Some feelings of bloating in the abdomen. Passage of more gas than usual.  Walking can help get rid of the air that was put into your GI tract during the procedure and reduce the bloating. If you had a lower endoscopy (such as a colonoscopy or flexible sigmoidoscopy) you may notice spotting of blood in your stool or on the toilet paper. If you underwent a bowel prep for your procedure, you may not have a normal bowel movement for a few days.  Please Note:  You might notice some irritation and congestion in your nose or some drainage.  This is from the oxygen used during your procedure.  There is no need for concern and it should clear up in a day or so.  SYMPTOMS TO REPORT IMMEDIATELY:    Following upper endoscopy (EGD)  Vomiting of blood or coffee ground material  New chest pain or pain under the shoulder blades  Painful or persistently difficult swallowing  New shortness of breath  Fever of 100F or higher  Black, tarry-looking stools  For urgent or emergent issues, a gastroenterologist can be reached at any hour by calling [phone].   DIET: See dilation diet-  Drink plenty of fluids but you should avoid alcoholic beverages for 24 hours.  ACTIVITY:  You should plan to take it easy for the rest of today and you should NOT DRIVE or use heavy machinery until tomorrow (because of the sedation medicines used during the test).    FOLLOW UP: Our staff will call the number  listed on your records the next business day following your procedure to check on you and address any questions or concerns that you may have regarding the information given to you following your procedure. If we do not reach you, we will leave a message.  However, if you are feeling well and you are not experiencing any problems, there is no need to return our call.  We will assume that you have returned to your regular daily activities without incident.  If any biopsies were taken you will be contacted by phone or by letter within the next 1-3 weeks.  Please call us at [phone] if you have not heard about the biopsies in 3 weeks.    SIGNATURES/CONFIDENTIALITY: You and/or your care partner have signed paperwork which will be entered into your electronic medical record.  These signatures attest to the fact that that the information above on your After Visit Summary has been reviewed and is understood.  Full responsibility of the confidentiality of this discharge information lies with you and/or your care-partner.  Hiatal hernia, dilation diet -handout given  Wait biopsy results.   Continue protonix (pantoprazole).

## 2015-04-26 NOTE — Op Note (Signed)
[facility]  [address] [place], [address]   ENDOSCOPY PROCEDURE REPORT  PATIENT: Felicia, Solis  MR#: [id number] BIRTHDATE: 1965/11/19 , 43  yrs. old GENDER: female ENDOSCOPIST: Harl Bowie, MD REFERRED BY:  Garnet Koyanagi, DO PROCEDURE DATE:  04/26/2015 PROCEDURE:  EGD w/ balloon dilation and EGD w/ biopsy ASA CLASS:     Class II INDICATIONS:  dysphagia. MEDICATIONS: Per Anesthesia TOPICAL ANESTHETIC: none  DESCRIPTION OF PROCEDURE: After the risks benefits and alternatives of the procedure were thoroughly explained, informed consent was obtained.  The LB [id number] endoscope was introduced through the mouth and advanced to the second portion of the duodenum , Without limitations.  The instrument was slowly withdrawn as the mucosa was fully examined.    ESOPHAGUS: The mucosa of the esophagus appeared normal.   Non obstructive schatzki's ring at GE junction.  Regular Z-line at 38cm.  Random biopsies obtained from diatal and proximal esophagus. Balloon dilation was performed with EZ dilate 18-19-20mm balloon, which slid through easily, no heme. Gastric mucosa appeared normal Duodenum appeared normal.  Retroflexed views revealed a hiatal hernia.     The scope was then withdrawn from the patient and the procedure completed.  COMPLICATIONS: There were no immediate complications.  ENDOSCOPIC IMPRESSION: Hiatal hernia Non obstructive schatzki's ring  RECOMMENDATIONS: 1.  Await biopsy results 2.  Anti-reflux regimen to be follow 3.  Continue PPI    eSigned:  Harl Bowie, MD 04/26/2015 4:31 PM

## 2015-04-26 NOTE — Progress Notes (Signed)
Pt restless during procedure Sa02 remained 100 %, moving requiring assistance - Suctioned HOB elevated  Awake after procedure to RR Stable

## 2015-04-27 ENCOUNTER — Telehealth: Payer: Self-pay

## 2015-04-27 NOTE — Telephone Encounter (Signed)
  Follow up Call-  Call back number 04/26/2015 12/03/2014  Post procedure Call Back phone  # [phone]  Permission to leave phone message Yes Yes     Patient questions:  Do you have a fever, pain , or abdominal swelling? No. Pain Score  0 *  Have you tolerated food without any problems? Yes.    Have you been able to return to your normal activities? Yes.    Do you have any questions about your discharge instructions: Diet   No. Medications  No. Follow up visit  No.  Do you have questions or concerns about your Care? No.  Actions: * If pain score is 4 or above: No action needed, pain <4.   No problems per the pt. maw

## 2015-05-05 ENCOUNTER — Ambulatory Visit
Admission: RE | Admit: 2015-05-05 | Discharge: 2015-05-05 | Disposition: A | Discharge status: Home / Self Care | Payer: No Typology Code available for payment source | Source: Ambulatory Visit

## 2015-05-05 DIAGNOSIS — Z1231 Encounter for screening mammogram for malignant neoplasm of breast: Secondary | ICD-10-CM

## 2015-05-06 ENCOUNTER — Other Ambulatory Visit: Payer: Self-pay | Admitting: Family Medicine

## 2015-05-06 DIAGNOSIS — IMO0002 Reserved for concepts with insufficient information to code with codable children: Secondary | ICD-10-CM

## 2015-05-06 DIAGNOSIS — N63 Unspecified lump in unspecified breast: Secondary | ICD-10-CM

## 2015-05-10 ENCOUNTER — Other Ambulatory Visit (HOSPITAL_COMMUNITY): Payer: Self-pay | Admitting: *Deleted

## 2015-05-10 DIAGNOSIS — N644 Mastodynia: Secondary | ICD-10-CM

## 2015-05-13 ENCOUNTER — Ambulatory Visit
Admission: RE | Admit: 2015-05-13 | Discharge: 2015-05-13 | Disposition: A | Discharge status: Home / Self Care | Payer: No Typology Code available for payment source | Source: Ambulatory Visit | Attending: Family Medicine | Admitting: Family Medicine

## 2015-05-13 ENCOUNTER — Ambulatory Visit
Admission: RE | Admit: 2015-05-13 | Discharge: 2015-05-13 | Disposition: A | Discharge status: Home / Self Care | Payer: No Typology Code available for payment source | Source: Ambulatory Visit | Attending: Obstetrics and Gynecology | Admitting: Obstetrics and Gynecology

## 2015-05-13 ENCOUNTER — Encounter (HOSPITAL_COMMUNITY): Payer: Self-pay

## 2015-05-13 ENCOUNTER — Ambulatory Visit (HOSPITAL_COMMUNITY)
Admission: RE | Admit: 2015-05-13 | Discharge: 2015-05-13 | Disposition: A | Discharge status: Home / Self Care | Payer: Self-pay | Source: Ambulatory Visit | Attending: Obstetrics and Gynecology | Admitting: Obstetrics and Gynecology

## 2015-05-13 VITALS — BP 114/78 | Temp 97.8°F | Ht 66.0 in | Wt 229.0 lb

## 2015-05-13 DIAGNOSIS — N644 Mastodynia: Secondary | ICD-10-CM

## 2015-05-13 DIAGNOSIS — N63 Unspecified lump in unspecified breast: Secondary | ICD-10-CM

## 2015-05-13 DIAGNOSIS — Z1239 Encounter for other screening for malignant neoplasm of breast: Secondary | ICD-10-CM

## 2015-05-13 DIAGNOSIS — IMO0002 Reserved for concepts with insufficient information to code with codable children: Secondary | ICD-10-CM

## 2015-05-13 NOTE — Patient Instructions (Addendum)
Educational materials on self breast awareness given. Explained to Felicia Solis that she did not need a Pap smear today due to last Pap smear was 08/06/2014. Let patient know that her next Pap smear is due in February 2017 due to her history of abnormal Pap smears. Told patient to call Gabriel Cirri to schedule with [facility]. Referred patient to the [facility] for diagnostic mammogram. Appointment scheduled for Friday, May 13, 2015 at 1540. Patient aware of appointment and will be there.  Felicia Solis verbalized understanding.  Janae Bonser, Arvil Chaco, RN 3:59 PM

## 2015-05-13 NOTE — Progress Notes (Signed)
Complaints of right breast being larger than left breast that has increased. Complaints of bilateral breast pain that is greater within the right breast that comes and goes. Patient rates pain at a 5 out of 10 in the right breast and 3 out of 10 in the left breast.   Pap Smear: Pap smear not completed today. Last Pap smear was 08/06/2014 at [facility] and normal with negative HPV. Per patient has a history of an abnormal Pap smear in 2015 that a colposcopy was completed for follow up. Patient also had an abnormal Pap smear 08/27/2011 that was ASCUS HPV negative. Let patient know that her next Pap smear is due in February 2017 due to her history of abnormal Pap smears. Last Pap smear and Pap smear result in 2013 is in EPIC.  Physical exam: Breasts Right breast is larger than left breast. No skin abnormalities bilateral breasts. No nipple retraction bilateral breasts. No nipple discharge bilateral breasts. No lymphadenopathy. No lumps palpated bilateral breasts. No complaints of pain or tenderness on exam. Referred patient to the [facility] for diagnostic mammogram. Appointment scheduled for Friday, May 13, 2015 at 1540.  Pelvic/Bimanual No Pap smear completed today since last Pap smear was 08/06/2014. Pap smear not indicated per [facility] guidelines.

## 2015-05-16 ENCOUNTER — Ambulatory Visit: Payer: Self-pay | Admitting: Family Medicine

## 2015-05-24 ENCOUNTER — Telehealth: Payer: Self-pay | Admitting: Family Medicine

## 2015-05-24 NOTE — Telephone Encounter (Signed)
Caller name: Angelise  Relationship to patient: Self  Can be reached: [phone]  Reason for call: pt says that she is a diabetic and would like to know if it is okay to take NiteQuil? Please advise further.

## 2015-05-24 NOTE — Telephone Encounter (Signed)
I would recommend tablet such as Mucinex DM twice a day as needed

## 2015-05-24 NOTE — Telephone Encounter (Signed)
Pt notified. Done.

## 2015-05-25 ENCOUNTER — Ambulatory Visit: Payer: Self-pay | Admitting: Physician Assistant

## 2015-05-26 ENCOUNTER — Encounter: Payer: Self-pay | Admitting: Gastroenterology

## 2015-05-31 ENCOUNTER — Ambulatory Visit (INDEPENDENT_AMBULATORY_CARE_PROVIDER_SITE_OTHER): Payer: Self-pay | Admitting: Internal Medicine

## 2015-05-31 ENCOUNTER — Encounter: Payer: Self-pay | Admitting: Internal Medicine

## 2015-05-31 VITALS — BP 118/64 | HR 66 | Temp 98.3°F | Resp 14 | Ht 66.0 in | Wt 227.4 lb

## 2015-05-31 DIAGNOSIS — Z0000 Encounter for general adult medical examination without abnormal findings: Secondary | ICD-10-CM

## 2015-05-31 DIAGNOSIS — M25561 Pain in right knee: Secondary | ICD-10-CM

## 2015-05-31 MED ORDER — MELOXICAM 15 MG PO TABS: 15.0000 mg | ORAL_TABLET | Freq: Every day | ORAL | Status: DC | PRN

## 2015-05-31 NOTE — Patient Instructions (Signed)
Get a knee sleeve and use it basically   Meloxicam 1 tablet a day as needed for pain.  Always take it with food because may cause gastritis and ulcers.  If you notice nausea, stomach pain, change in the color of stools --->  Stop the medicine and let us know. Do not use it  for more than 2 or 3 weeks   Okay to take Tylenol along with meloxicam   Use ice after walking all day long.  Call if not improving in the next 3 weeks

## 2015-05-31 NOTE — Progress Notes (Signed)
Subjective:    Patient ID: Felicia Solis, female    DOB: 1966/02/08, 49 y.o.   MRN: [id number]  DOS:  05/31/2015 Type of visit - description : Acute visit Interval history:  3-4 days history of right knee pain, pain is mild but constant, worse when she bends her knee, lift the leg or walk. Had mild swelling with the onset of symptoms but that has resolved. No fever chills No redness, warmness. No injury, she is very active at work but that is  not unusual.  Review of Systems  As above Past Medical History  Diagnosis Date  . Angina     pt states she has occasional chest pains no other sxs.. seen in ed  at  [facility] .. all tsts negative.   . Asthma 5 yrs ago     told she had asthma.. does not remember doctors name.   . Shortness of breath     with exertion  . Tuberculosis     pt states she took 9 months of medicine for positive tb skin test.   . Anemia     anemia  when pregnant.  10 yrs ago.   Marland Kitchen Headache(784.0)     years ago told h/a s were migraines.   . Depression     takes citalopram  . Renal disorder     kidney stones  . Diabetes ([place]) 2016  . Anxiety   . Hyperlipidemia   . Thyroid disease     Past Surgical History  Procedure Laterality Date  . Endometrial ablation    . Tubal ligation    . Laparoscopy      years ago ..pt does not know where surgery was done ... states" my stomach would blow up then go down"  . Dilation and curettage of uterus  1999  . Thyroidectomy  09/28/2011    Procedure: THYROIDECTOMY;  Surgeon: Izora Gala, MD;  Location: [facility];  Service: ENT;  Laterality: Right;  RIGHT THYROIDECTOMY WITH FROZEN SECTION    Social History   Social History  . Marital Status: Married    Spouse Name: N/A  . Number of Children: 3  . Years of Education: N/A   Occupational History  . CNA    Social History Main Topics  . Smoking status: Never Smoker   . Smokeless tobacco: Never Used  . Alcohol Use: No  . Drug Use: No  . Sexual Activity:   Partners: Male    Birth Control/ Protection: Surgical   Other Topics Concern  . Not on file   Social History Narrative        Medication List       This list is accurate as of: 05/31/15 11:59 PM.  Always use your most recent med list.               acetaminophen 500 MG tablet  Commonly known as:  TYLENOL  Take 1,000 mg by mouth every 6 (six) hours as needed for mild pain.     citalopram 20 MG tablet  Commonly known as:  CELEXA  Take 1 tablet (20 mg total) by mouth daily.     glucose blood test strip  Commonly known as:  ONETOUCH VERIO  Onetouch Verio Check blood sugar twice daily     losartan 25 MG tablet  Commonly known as:  COZAAR  Take 1 tablet (25 mg total) by mouth daily.     meloxicam 15 MG tablet  Commonly known as:  MOBIC  Take 1 tablet (15 mg total) by mouth daily as needed for pain.     metFORMIN 500 MG 24 hr tablet  Commonly known as:  GLUCOPHAGE XR  Take 2 tablets (1,000 mg total) by mouth daily with breakfast.     multivitamin with minerals Tabs tablet  Take 1 tablet by mouth daily.     ONETOUCH DELICA LANCETS FINE Misc  Check blood sugar twice daily     pantoprazole 40 MG tablet  Commonly known as:  PROTONIX  Take 1 tablet (40 mg total) by mouth daily.     simvastatin 20 MG tablet  Commonly known as:  ZOCOR  Take 1 tablet (20 mg total) by mouth at bedtime.           Objective:   Physical Exam BP 118/64 mmHg  Pulse 66  Temp(Src) 98.3 F (36.8 C) (Oral)  Resp 14  Ht 5\' 6"  (1.676 m)  Wt 227 lb 6 oz (103.137 kg)  BMI 36.72 kg/m2 General:   Well developed, well nourished . NAD.  HEENT:  Normocephalic . Face symmetric, atraumatic MSK: Left knee normal Right knee: No deformity, redness or warmness. No effusion. Range of motion normal. Slightly TTP at the external aspect. pivoting the knee does trigger some pain. Skin: Not pale. Not jaundice Neurologic:  alert & oriented X3.  Speech normal, gait appropriate for age and  unassisted Psych--  Cognition and judgment appear intact.  Cooperative with normal attention span and concentration.  Behavior appropriate. No anxious or depressed appearing.      Assessment & Plan:   Knee pain: Sprain? small meniscal tear ?Marland Kitchen Recommend conservative treatment with meloxicam (GI precautions discussed) , ICE, knee sleeve, Tylenol. If no better patient will call for a sports medicine referral

## 2015-05-31 NOTE — Progress Notes (Signed)
Pre visit review using our clinic review tool, if applicable. No additional management support is needed unless otherwise documented below in the visit note. 

## 2015-06-01 ENCOUNTER — Telehealth: Payer: Self-pay | Admitting: Physician Assistant

## 2015-06-01 NOTE — Telephone Encounter (Signed)
No charge. 

## 2015-06-01 NOTE — Telephone Encounter (Signed)
Pt was no show 05/25/15 7:15am for acute visit, pt has not rescheduled for same issue but has been seen 12/6 for another issue, charge or no charge?

## 2015-07-06 ENCOUNTER — Encounter (HOSPITAL_BASED_OUTPATIENT_CLINIC_OR_DEPARTMENT_OTHER): Payer: Self-pay

## 2015-07-06 ENCOUNTER — Telehealth: Payer: Self-pay | Admitting: Family Medicine

## 2015-07-06 ENCOUNTER — Emergency Department (HOSPITAL_BASED_OUTPATIENT_CLINIC_OR_DEPARTMENT_OTHER)
Admission: EM | Admit: 2015-07-06 | Discharge: 2015-07-07 | Disposition: A | Discharge status: Home / Self Care | Payer: BLUE CROSS/BLUE SHIELD | Attending: Physician Assistant | Admitting: Physician Assistant

## 2015-07-06 ENCOUNTER — Emergency Department (HOSPITAL_BASED_OUTPATIENT_CLINIC_OR_DEPARTMENT_OTHER): Payer: BLUE CROSS/BLUE SHIELD

## 2015-07-06 DIAGNOSIS — Z862 Personal history of diseases of the blood and blood-forming organs and certain disorders involving the immune mechanism: Secondary | ICD-10-CM | POA: Diagnosis not present

## 2015-07-06 DIAGNOSIS — M546 Pain in thoracic spine: Secondary | ICD-10-CM | POA: Insufficient documentation

## 2015-07-06 DIAGNOSIS — E785 Hyperlipidemia, unspecified: Secondary | ICD-10-CM | POA: Diagnosis not present

## 2015-07-06 DIAGNOSIS — F329 Major depressive disorder, single episode, unspecified: Secondary | ICD-10-CM | POA: Insufficient documentation

## 2015-07-06 DIAGNOSIS — E119 Type 2 diabetes mellitus without complications: Secondary | ICD-10-CM | POA: Diagnosis not present

## 2015-07-06 DIAGNOSIS — Z79899 Other long term (current) drug therapy: Secondary | ICD-10-CM | POA: Diagnosis not present

## 2015-07-06 DIAGNOSIS — Z8611 Personal history of tuberculosis: Secondary | ICD-10-CM | POA: Diagnosis not present

## 2015-07-06 DIAGNOSIS — J45909 Unspecified asthma, uncomplicated: Secondary | ICD-10-CM | POA: Insufficient documentation

## 2015-07-06 DIAGNOSIS — Z8679 Personal history of other diseases of the circulatory system: Secondary | ICD-10-CM | POA: Insufficient documentation

## 2015-07-06 DIAGNOSIS — R079 Chest pain, unspecified: Secondary | ICD-10-CM | POA: Diagnosis present

## 2015-07-06 DIAGNOSIS — Z87442 Personal history of urinary calculi: Secondary | ICD-10-CM | POA: Insufficient documentation

## 2015-07-06 DIAGNOSIS — F419 Anxiety disorder, unspecified: Secondary | ICD-10-CM | POA: Diagnosis not present

## 2015-07-06 DIAGNOSIS — Z7984 Long term (current) use of oral hypoglycemic drugs: Secondary | ICD-10-CM | POA: Insufficient documentation

## 2015-07-06 LAB — CBC WITH DIFFERENTIAL/PLATELET
BASOS PCT: 0 %
Basophils Absolute: 0 10*3/uL (ref 0.0–0.1)
EOS ABS: 0.2 10*3/uL (ref 0.0–0.7)
Eosinophils Relative: 2 %
HCT: 37.1 % (ref 36.0–46.0)
Hemoglobin: 12.1 g/dL (ref 12.0–15.0)
Lymphocytes Relative: 48 %
Lymphs Abs: 3.8 10*3/uL (ref 0.7–4.0)
MCH: 28.1 pg (ref 26.0–34.0)
MCHC: 32.6 g/dL (ref 30.0–36.0)
MCV: 86.3 fL (ref 78.0–100.0)
MONO ABS: 0.6 10*3/uL (ref 0.1–1.0)
MONOS PCT: 8 %
Neutro Abs: 3.4 10*3/uL (ref 1.7–7.7)
Neutrophils Relative %: 42 %
Platelets: 287 10*3/uL (ref 150–400)
RBC: 4.3 MIL/uL (ref 3.87–5.11)
RDW: 12.8 % (ref 11.5–15.5)
WBC: 7.9 10*3/uL (ref 4.0–10.5)

## 2015-07-06 LAB — COMPREHENSIVE METABOLIC PANEL
ALBUMIN: 4 g/dL (ref 3.5–5.0)
ALT: 22 U/L (ref 14–54)
ANION GAP: 6 (ref 5–15)
AST: 24 U/L (ref 15–41)
Alkaline Phosphatase: 76 U/L (ref 38–126)
BILIRUBIN TOTAL: 0.3 mg/dL (ref 0.3–1.2)
BUN: 11 mg/dL (ref 6–20)
CO2: 27 mmol/L (ref 22–32)
Calcium: 9.5 mg/dL (ref 8.9–10.3)
Chloride: 107 mmol/L (ref 101–111)
Creatinine, Ser: 0.56 mg/dL (ref 0.44–1.00)
GFR calc non Af Amer: >60 mL/min (ref >60)
GLUCOSE: 166 mg/dL — AB (ref 65–99)
POTASSIUM: 3.4 mmol/L — AB (ref 3.5–5.1)
SODIUM: 140 mmol/L (ref 135–145)
TOTAL PROTEIN: 7.1 g/dL (ref 6.5–8.1)

## 2015-07-06 LAB — TROPONIN I: Troponin I: <0.03 ng/mL (ref <0.031)

## 2015-07-06 MED ORDER — IBUPROFEN 800 MG PO TABS
800.0000 mg | ORAL_TABLET | Freq: Once | ORAL | Status: AC
  Administered 2015-07-06: 800 mg via ORAL
  Filled 2015-07-06: qty 1

## 2015-07-06 NOTE — Telephone Encounter (Signed)
Transferred to [facility]. °

## 2015-07-06 NOTE — Telephone Encounter (Signed)
[facility] - Client TELEPHONE ADVICE RECORD [facility]  Patient Name: Felicia Solis  DOB: [birth year]    Initial Comment Caller states has pain under right breast by ribs when she takes a deep breath or coughs Radiating to back. Has a hard time catching her breath sometimes.   Nurse Assessment  Nurse: Wayne Sever, RN, Tillie Rung Date/Time (Eastern Time): 07/06/2015 3:26:54 PM  Confirm and document reason for call. If symptomatic, describe symptoms. ---Caller c/o pain on the right side. She states the pain is on her right side towards her back. She states it runs up under the right side where her breast is also.  Has the patient traveled out of the country within the last 30 days? ---Not Applicable  Does the patient have any new or worsening symptoms? ---Yes  Will a triage be completed? ---Yes  Related visit to physician within the last 2 weeks? ---No  Does the PT have any chronic conditions? (i.e. diabetes, asthma, etc.) ---Yes  List chronic conditions. ---Diabetic, High Cholesterol  Did the patient indicate they were pregnant? ---No  Is this a behavioral health or substance abuse call? ---No     Guidelines    Guideline Title Affirmed Question Affirmed Notes  Abdominal Pain - Upper [1] MILD-MODERATE pain AND [2] constant AND [3] present > 2 hours    Final Disposition User   See Physician within 4 Hours (or PCP triage) Wayne Sever, RN, Tillie Rung    Comments  No appointments avaliable   Referrals  [facility]   Disagree/Comply: Comply

## 2015-07-06 NOTE — ED Provider Notes (Signed)
CSN: [id number]     Arrival date & time 07/06/15  2000 History  By signing my name below, I, Helane Gunther, attest that this documentation has been prepared under the direction and in the presence of Everson Mott Julio Alm, MD. Electronically Signed: Helane Gunther, ED Scribe. 07/06/2015. 8:53 PM.    Chief Complaint  Patient presents with  . Chest Pain   The history is provided by the patient. No language interpreter was used.   HPI Comments: Felicia Solis is a 50 y.o. female with a PMHx of angina, SOB, HLD, DM, and renal disorder who presents to the Emergency Department complaining of constant, aching, right-sided chest pain under the right breast onset 3 days ago. She reports associated right-sided upper back pain. She notes exacerbation of the pain with deep breathing, coughing, and sneezing. She reports a PMHx of angina and anxiety. She notes she was seen by her GI specialist recently. She denies any recent travel or use of hormones. She denies a PMHx of HTN. Pt is allergic to lisinopril.   Past Medical History  Diagnosis Date  . Angina     pt states she has occasional chest pains no other sxs.. seen in ed  at  [facility] .. all tsts negative.   . Asthma 5 yrs ago     told she had asthma.. does not remember doctors name.   . Shortness of breath     with exertion  . Tuberculosis     pt states she took 9 months of medicine for positive tb skin test.   . Anemia     anemia  when pregnant.  10 yrs ago.   Marland Kitchen Headache(784.0)     years ago told h/a s were migraines.   . Depression     takes citalopram  . Renal disorder     kidney stones  . Diabetes ([place]) 2016  . Anxiety   . Hyperlipidemia   . Thyroid disease    Past Surgical History  Procedure Laterality Date  . Endometrial ablation    . Tubal ligation    . Laparoscopy      years ago ..pt does not know where surgery was done ... states" my stomach would blow up then go down"  . Dilation and curettage of uterus  1999  .  Thyroidectomy  09/28/2011    Procedure: THYROIDECTOMY;  Surgeon: Izora Gala, MD;  Location: [facility];  Service: ENT;  Laterality: Right;  RIGHT THYROIDECTOMY WITH FROZEN SECTION   Family History  Problem Relation Age of Onset  . Diabetes Mother   . Hypertension Mother   . Hyperlipidemia Mother   . Gallbladder disease Mother   . Diabetes Sister   . Hypertension Father   . Hyperlipidemia Father   . Colon polyps Father   . Ovarian cancer Maternal Grandmother   . Gallbladder disease Maternal Grandmother   . Colon cancer Paternal Grandfather   . Breast cancer Maternal Aunt   . Anesthesia problems Neg Hx   . Hypotension Neg Hx   . Malignant hyperthermia Neg Hx   . Pseudochol deficiency Neg Hx   . Esophageal cancer Neg Hx   . Heart disease Other     early cad  . Diabetes Brother    Social History  Substance Use Topics  . Smoking status: Never Smoker   . Smokeless tobacco: Never Used  . Alcohol Use: No   OB History    Gravida Para Term Preterm AB TAB SAB Ectopic Multiple Living  5 3 3  2  2   3      Review of Systems  Cardiovascular: Positive for chest pain.  Musculoskeletal: Positive for back pain.  All other systems reviewed and are negative.   Allergies  Lisinopril  Home Medications   Prior to Admission medications   Medication Sig Start Date End Date Taking? Authorizing Provider  acetaminophen (TYLENOL) 500 MG tablet Take 1,000 mg by mouth every 6 (six) hours as needed for mild pain.    Historical Provider, MD  citalopram (CELEXA) 20 MG tablet Take 1 tablet (20 mg total) by mouth daily. 03/22/15   Rosalita Chessman, DO  glucose blood (ONETOUCH VERIO) test strip Onetouch Verio Check blood sugar twice daily Patient not taking: Reported on 05/31/2015 08/26/14   Rosalita Chessman, DO  losartan (COZAAR) 25 MG tablet Take 1 tablet (25 mg total) by mouth daily. 02/04/15   Rosalita Chessman, DO  meloxicam (MOBIC) 15 MG tablet Take 1 tablet (15 mg total) by mouth daily as needed for pain.  05/31/15   Colon Branch, MD  metFORMIN (GLUCOPHAGE XR) 500 MG 24 hr tablet Take 2 tablets (1,000 mg total) by mouth daily with breakfast. 02/04/15   Rosalita Chessman, DO  Multiple Vitamin (MULTIVITAMIN WITH MINERALS) TABS tablet Take 1 tablet by mouth daily.    Historical Provider, MD  [facility] DELICA LANCETS FINE MISC Check blood sugar twice daily Patient not taking: Reported on 05/31/2015 08/26/14   Alferd Apa Lowne, DO  pantoprazole (PROTONIX) 40 MG tablet Take 1 tablet (40 mg total) by mouth daily. 03/22/15   Rosalita Chessman, DO  simvastatin (ZOCOR) 20 MG tablet Take 1 tablet (20 mg total) by mouth at bedtime. Patient not taking: Reported on 05/31/2015 11/16/14   Alferd Apa Lowne, DO   BP 104/68 mmHg  Pulse 68  Temp(Src) 98 F (36.7 C) (Oral)  Resp 18  Ht 5\' 6"  (1.676 m)  Wt 237 lb (107.502 kg)  BMI 38.27 kg/m2  SpO2 100% Physical Exam  Constitutional: She is oriented to person, place, and time. She appears well-developed and well-nourished.  HENT:  Head: Normocephalic and atraumatic.  Eyes: Conjunctivae are normal. Right eye exhibits no discharge. Left eye exhibits no discharge.  Cardiovascular: Normal rate, regular rhythm and normal heart sounds.   Pulmonary/Chest: Effort normal and breath sounds normal. No respiratory distress. She has no wheezes. She has no rales. She exhibits tenderness.  Equal breath sounds and CTA bilaterally. Mild TTP under the right breast  Abdominal: Soft. There is no tenderness.  Neurological: She is alert and oriented to person, place, and time. Coordination normal.  Skin: Skin is warm and dry. No rash noted. She is not diaphoretic. No erythema.  Psychiatric: She has a normal mood and affect.  Nursing note and vitals reviewed.   ED Course  Procedures  DIAGNOSTIC STUDIES: Oxygen Saturation is 100% on RA, normal by my interpretation.    COORDINATION OF CARE: 8:39 PM - Discussed normal EKG results and possible costal chondritis. Discussed plans to order a chest XR  and diagnostic studies, as well as ibuprofen. Pt advised of plan for treatment and pt agrees.  Labs Review Labs Reviewed  COMPREHENSIVE METABOLIC PANEL - Abnormal; Notable for the following:    Potassium 3.4 (*)    Glucose, Bld 166 (*)    All other components within normal limits  CBC WITH DIFFERENTIAL/PLATELET  TROPONIN I  Randolm Idol, ED    Imaging Review Dg Chest 2 View  07/06/2015  CLINICAL DATA:  Acute right-sided chest pain. EXAM: CHEST  2 VIEW COMPARISON:  March 09, 2015. FINDINGS: The heart size and mediastinal contours are within normal limits. Both lungs are clear. No pneumothorax or pleural effusion is noted. The visualized skeletal structures are unremarkable. IMPRESSION: No active cardiopulmonary disease. Electronically Signed   By: Marijo Conception, M.D.   On: 07/06/2015 22:49   I have personally reviewed and evaluated these images and lab results as part of my medical decision-making.   EKG Interpretation   Date/Time:  Wednesday July 06 2015 20:18:52 EST Ventricular Rate:  72 PR Interval:  164 QRS Duration: 94 QT Interval:  391 QTC Calculation: 428 R Axis:   67 Text Interpretation:  Sinus rhythm no acute ischemia No significant change  since last tracing Confirmed by Gerald Leitz ([id number]) on 07/06/2015  8:27:02 PM      MDM   Final diagnoses:  Chest pain, unspecified chest pain type    Patient is a 50 year old female with history of diabetes and hyperlipidemia with multiple visits to the emergency department and her primary care physician for chest pain. She's had a recent GI workup additionally for trouble swallowing. She has history of anxiety disorder. Patient presents today with chest pain for the last 3 days. She believes it's worse with deep breaths coughing and sneezing. Patient has no risk factors for pulmonary embolism including no recent trips, no leg swelling, no exogenous estrogens.  We will get chest x-ray today. We will get single  troponin given that she has had the pain for 3 days this will be sufficient to rule out ischemia. She has a nonischemic EKG.  We will give ibuprofen and if all lab studies are normal we will have patient return to follow up with her PCP this week.  I personally performed the services described in this documentation, which was scribed in my presence. The recorded information has been reviewed and is accurate.     Alayha Babineaux Julio Alm, MD 07/06/15 2358

## 2015-07-06 NOTE — Discharge Instructions (Signed)
We are unsure what is causing your chest pain tongith.  We want you to follow upw with youre doctor this week. Please use tyelenol to help with soreness and use maalox if your stomach is upset.   Chest Wall Pain Chest wall pain is pain in or around the bones and muscles of your chest. Sometimes, an injury causes this pain. Sometimes, the cause may not be known. This pain may take several weeks or longer to get better. HOME CARE INSTRUCTIONS  Pay attention to any changes in your symptoms. Take these actions to help with your pain:   Rest as told by your health care provider.   Avoid activities that cause pain. These include any activities that use your chest muscles or your abdominal and side muscles to lift heavy items.   If directed, apply ice to the painful area:  Put ice in a plastic bag.  Place a towel between your skin and the bag.  Leave the ice on for 20 minutes, 2-3 times per day.  Take over-the-counter and prescription medicines only as told by your health care provider.  Do not use tobacco products, including cigarettes, chewing tobacco, and e-cigarettes. If you need help quitting, ask your health care provider.  Keep all follow-up visits as told by your health care provider. This is important. SEEK MEDICAL CARE IF:  You have a fever.  Your chest pain becomes worse.  You have new symptoms. SEEK IMMEDIATE MEDICAL CARE IF:  You have nausea or vomiting.  You feel sweaty or light-headed.  You have a cough with phlegm (sputum) or you cough up blood.  You develop shortness of breath.   This information is not intended to replace advice given to you by your health care provider. Make sure you discuss any questions you have with your health care provider.   Document Released: 06/11/2005 Document Revised: 03/02/2015 Document Reviewed: 09/06/2014 Elsevier Interactive Patient Education [employer].

## 2015-07-06 NOTE — Telephone Encounter (Signed)
Called patient to see if appointment can be schedulred for her. Dr. Sherrine Maples has 1 opening for tomorrow if patient can call back asas if symptoms allow, we could schedule appointment.   Left message for call back ASAP.

## 2015-07-06 NOTE — ED Notes (Addendum)
C/o right side CP, back pain x 3 days-reports minimal dry cough-NAD

## 2015-07-22 ENCOUNTER — Other Ambulatory Visit: Payer: Self-pay | Admitting: Family Medicine

## 2015-08-15 ENCOUNTER — Encounter: Payer: Self-pay | Admitting: Family Medicine

## 2015-08-15 ENCOUNTER — Ambulatory Visit (INDEPENDENT_AMBULATORY_CARE_PROVIDER_SITE_OTHER): Payer: BLUE CROSS/BLUE SHIELD | Admitting: Family Medicine

## 2015-08-15 ENCOUNTER — Other Ambulatory Visit: Payer: Self-pay | Admitting: Family Medicine

## 2015-08-15 VITALS — BP 124/86 | HR 62 | Temp 98.0°F | Ht 66.0 in | Wt 225.0 lb

## 2015-08-15 DIAGNOSIS — E785 Hyperlipidemia, unspecified: Secondary | ICD-10-CM | POA: Diagnosis not present

## 2015-08-15 DIAGNOSIS — K219 Gastro-esophageal reflux disease without esophagitis: Secondary | ICD-10-CM

## 2015-08-15 DIAGNOSIS — M25561 Pain in right knee: Secondary | ICD-10-CM

## 2015-08-15 DIAGNOSIS — I10 Essential (primary) hypertension: Secondary | ICD-10-CM

## 2015-08-15 DIAGNOSIS — E1169 Type 2 diabetes mellitus with other specified complication: Secondary | ICD-10-CM

## 2015-08-15 DIAGNOSIS — M62830 Muscle spasm of back: Secondary | ICD-10-CM

## 2015-08-15 DIAGNOSIS — F411 Generalized anxiety disorder: Secondary | ICD-10-CM

## 2015-08-15 DIAGNOSIS — Z0000 Encounter for general adult medical examination without abnormal findings: Secondary | ICD-10-CM

## 2015-08-15 DIAGNOSIS — Z114 Encounter for screening for human immunodeficiency virus [HIV]: Secondary | ICD-10-CM | POA: Diagnosis not present

## 2015-08-15 DIAGNOSIS — M25562 Pain in left knee: Secondary | ICD-10-CM

## 2015-08-15 DIAGNOSIS — E119 Type 2 diabetes mellitus without complications: Secondary | ICD-10-CM | POA: Diagnosis not present

## 2015-08-15 DIAGNOSIS — Z0001 Encounter for general adult medical examination with abnormal findings: Secondary | ICD-10-CM

## 2015-08-15 DIAGNOSIS — M170 Bilateral primary osteoarthritis of knee: Secondary | ICD-10-CM

## 2015-08-15 LAB — LIPID PANEL
CHOLESTEROL: 188 mg/dL (ref 0–200)
HDL: 39.2 mg/dL (ref >39.00)
LDL CALC: 132 mg/dL — AB (ref 0–99)
NONHDL: 148.86
Total CHOL/HDL Ratio: 5
Triglycerides: 82 mg/dL (ref 0.0–149.0)
VLDL: 16.4 mg/dL (ref 0.0–40.0)

## 2015-08-15 LAB — POCT URINALYSIS DIPSTICK
Bilirubin, UA: NEGATIVE
GLUCOSE UA: NEGATIVE
Ketones, UA: NEGATIVE
Leukocytes, UA: NEGATIVE
NITRITE UA: NEGATIVE
PH UA: 6
Protein, UA: NEGATIVE
RBC UA: NEGATIVE
SPEC GRAV UA: 1.015
UROBILINOGEN UA: NEGATIVE

## 2015-08-15 LAB — COMPREHENSIVE METABOLIC PANEL
ALT: 19 U/L (ref 0–35)
AST: 16 U/L (ref 0–37)
Albumin: 4.3 g/dL (ref 3.5–5.2)
Alkaline Phosphatase: 67 U/L (ref 39–117)
BUN: 10 mg/dL (ref 6–23)
CHLORIDE: 105 meq/L (ref 96–112)
CO2: 27 meq/L (ref 19–32)
CREATININE: 0.47 mg/dL (ref 0.40–1.20)
Calcium: 10 mg/dL (ref 8.4–10.5)
GFR: 179.78 mL/min (ref >60.00)
GLUCOSE: 131 mg/dL — AB (ref 70–99)
POTASSIUM: 3.8 meq/L (ref 3.5–5.1)
SODIUM: 140 meq/L (ref 135–145)
Total Bilirubin: 0.3 mg/dL (ref 0.2–1.2)
Total Protein: 7.3 g/dL (ref 6.0–8.3)

## 2015-08-15 LAB — CBC WITH DIFFERENTIAL/PLATELET
BASOS PCT: 0.5 % (ref 0.0–3.0)
Basophils Absolute: 0 10*3/uL (ref 0.0–0.1)
EOS ABS: 0.1 10*3/uL (ref 0.0–0.7)
EOS PCT: 2.2 % (ref 0.0–5.0)
HEMATOCRIT: 39.6 % (ref 36.0–46.0)
HEMOGLOBIN: 13 g/dL (ref 12.0–15.0)
Lymphocytes Relative: 44.7 % (ref 12.0–46.0)
Lymphs Abs: 3 10*3/uL (ref 0.7–4.0)
MCHC: 32.8 g/dL (ref 30.0–36.0)
MCV: 84.8 fl (ref 78.0–100.0)
MONO ABS: 0.5 10*3/uL (ref 0.1–1.0)
Monocytes Relative: 7.3 % (ref 3.0–12.0)
NEUTROS ABS: 3 10*3/uL (ref 1.4–7.7)
Neutrophils Relative %: 45.3 % (ref 43.0–77.0)
PLATELETS: 297 10*3/uL (ref 150.0–400.0)
RBC: 4.67 Mil/uL (ref 3.87–5.11)
RDW: 14.1 % (ref 11.5–15.5)
WBC: 6.6 10*3/uL (ref 4.0–10.5)

## 2015-08-15 LAB — HIV ANTIBODY (ROUTINE TESTING W REFLEX): HIV: NONREACTIVE

## 2015-08-15 LAB — MICROALBUMIN / CREATININE URINE RATIO
CREATININE, U: 102.7 mg/dL
MICROALB UR: 1.2 mg/dL (ref 0.0–1.9)
Microalb Creat Ratio: 1.2 mg/g (ref 0.0–30.0)

## 2015-08-15 LAB — TSH: TSH: 1.06 u[IU]/mL (ref 0.35–4.50)

## 2015-08-15 LAB — HEMOGLOBIN A1C: HEMOGLOBIN A1C: 7.1 % — AB (ref 4.6–6.5)

## 2015-08-15 MED ORDER — CYCLOBENZAPRINE HCL 10 MG PO TABS: 10.0000 mg | ORAL_TABLET | Freq: Three times a day (TID) | ORAL | Status: DC | PRN

## 2015-08-15 MED ORDER — PANTOPRAZOLE SODIUM 40 MG PO TBEC: 40.0000 mg | DELAYED_RELEASE_TABLET | Freq: Every day | ORAL | Status: DC

## 2015-08-15 MED ORDER — MELOXICAM 15 MG PO TABS: 15.0000 mg | ORAL_TABLET | Freq: Every day | ORAL | Status: DC | PRN

## 2015-08-15 MED ORDER — METFORMIN HCL ER 500 MG PO TB24: ORAL_TABLET | ORAL | Status: DC

## 2015-08-15 MED ORDER — LOSARTAN POTASSIUM 25 MG PO TABS: 25.0000 mg | ORAL_TABLET | Freq: Every day | ORAL | Status: DC

## 2015-08-15 MED ORDER — SIMVASTATIN 20 MG PO TABS: 20.0000 mg | ORAL_TABLET | Freq: Every day | ORAL | Status: DC

## 2015-08-15 MED ORDER — CITALOPRAM HYDROBROMIDE 20 MG PO TABS: 20.0000 mg | ORAL_TABLET | Freq: Every day | ORAL | Status: DC

## 2015-08-15 NOTE — Progress Notes (Signed)
Pre visit review using our clinic review tool, if applicable. No additional management support is needed unless otherwise documented below in the visit note. 

## 2015-08-15 NOTE — Patient Instructions (Signed)
Preventive Care for Adults, Female A healthy lifestyle and preventive care can promote health and wellness. Preventive health guidelines for women include the following key practices.  A routine yearly physical is a good way to check with your health care provider about your health and preventive screening. It is a chance to share any concerns and updates on your health and to receive a thorough exam.  Visit your dentist for a routine exam and preventive care every 6 months. Brush your teeth twice a day and floss once a day. Good oral hygiene prevents tooth decay and gum disease.  The frequency of eye exams is based on your age, health, family medical history, use of contact lenses, and other factors. Follow your health care provider's recommendations for frequency of eye exams.  Eat a healthy diet. Foods like vegetables, fruits, whole grains, low-fat dairy products, and lean protein foods contain the nutrients you need without too many calories. Decrease your intake of foods high in solid fats, added sugars, and salt. Eat the right amount of calories for you.Get information about a proper diet from your health care provider, if necessary.  Regular physical exercise is one of the most important things you can do for your health. Most adults should get at least 150 minutes of moderate-intensity exercise (any activity that increases your heart rate and causes you to sweat) each week. In addition, most adults need muscle-strengthening exercises on 2 or more days a week.  Maintain a healthy weight. The body mass index (BMI) is a screening tool to identify possible weight problems. It provides an estimate of body fat based on height and weight. Your health care provider can find your BMI and can help you achieve or maintain a healthy weight.For adults 20 years and older:  A BMI below 18.5 is considered underweight.  A BMI of 18.5 to 24.9 is normal.  A BMI of 25 to 29.9 is considered overweight.  A  BMI of 30 and above is considered obese.  Maintain normal blood lipids and cholesterol levels by exercising and minimizing your intake of saturated fat. Eat a balanced diet with plenty of fruit and vegetables. Blood tests for lipids and cholesterol should begin at age 45 and be repeated every 5 years. If your lipid or cholesterol levels are high, you are over 50, or you are at high risk for heart disease, you may need your cholesterol levels checked more frequently.Ongoing high lipid and cholesterol levels should be treated with medicines if diet and exercise are not working.  If you smoke, find out from your health care provider how to quit. If you do not use tobacco, do not start.  Lung cancer screening is recommended for adults aged 45-80 years who are at high risk for developing lung cancer because of a history of smoking. A yearly low-dose CT scan of the lungs is recommended for people who have at least a 30-pack-year history of smoking and are a current smoker or have quit within the past 15 years. A pack year of smoking is smoking an average of 1 pack of cigarettes a day for 1 year (for example: 1 pack a day for 30 years or 2 packs a day for 15 years). Yearly screening should continue until the smoker has stopped smoking for at least 15 years. Yearly screening should be stopped for people who develop a health problem that would prevent them from having lung cancer treatment.  If you are pregnant, do not drink alcohol. If you are  breastfeeding, be very cautious about drinking alcohol. If you are not pregnant and choose to drink alcohol, do not have more than 1 drink per day. One drink is considered to be 12 ounces (355 mL) of beer, 5 ounces (148 mL) of wine, or 1.5 ounces (44 mL) of liquor.  Avoid use of street drugs. Do not share needles with anyone. Ask for help if you need support or instructions about stopping the use of drugs.  High blood pressure causes heart disease and increases the risk  of stroke. Your blood pressure should be checked at least every 1 to 2 years. Ongoing high blood pressure should be treated with medicines if weight loss and exercise do not work.  If you are 55-79 years old, ask your health care provider if you should take aspirin to prevent strokes.  Diabetes screening is done by taking a blood sample to check your blood glucose level after you have not eaten for a certain period of time (fasting). If you are not overweight and you do not have risk factors for diabetes, you should be screened once every 3 years starting at age 45. If you are overweight or obese and you are 40-70 years of age, you should be screened for diabetes every year as part of your cardiovascular risk assessment.  Breast cancer screening is essential preventive care for women. You should practice "breast self-awareness." This means understanding the normal appearance and feel of your breasts and may include breast self-examination. Any changes detected, no matter how small, should be reported to a health care provider. Women in their 20s and 30s should have a clinical breast exam (CBE) by a health care provider as part of a regular health exam every 1 to 3 years. After age 40, women should have a CBE every year. Starting at age 40, women should consider having a mammogram (breast X-ray test) every year. Women who have a family history of breast cancer should talk to their health care provider about genetic screening. Women at a high risk of breast cancer should talk to their health care providers about having an MRI and a mammogram every year.  Breast cancer gene (BRCA)-related cancer risk assessment is recommended for women who have family members with BRCA-related cancers. BRCA-related cancers include breast, ovarian, tubal, and peritoneal cancers. Having family members with these cancers may be associated with an increased risk for harmful changes (mutations) in the breast cancer genes BRCA1 and  BRCA2. Results of the assessment will determine the need for genetic counseling and BRCA1 and BRCA2 testing.  Your health care provider may recommend that you be screened regularly for cancer of the pelvic organs (ovaries, uterus, and vagina). This screening involves a pelvic examination, including checking for microscopic changes to the surface of your cervix (Pap test). You may be encouraged to have this screening done every 3 years, beginning at age 21.  For women ages 30-65, health care providers may recommend pelvic exams and Pap testing every 3 years, or they may recommend the Pap and pelvic exam, combined with testing for human papilloma virus (HPV), every 5 years. Some types of HPV increase your risk of cervical cancer. Testing for HPV may also be done on women of any age with unclear Pap test results.  Other health care providers may not recommend any screening for nonpregnant women who are considered low risk for pelvic cancer and who do not have symptoms. Ask your health care provider if a screening pelvic exam is right for   you.  If you have had past treatment for cervical cancer or a condition that could lead to cancer, you need Pap tests and screening for cancer for at least 20 years after your treatment. If Pap tests have been discontinued, your risk factors (such as having a new sexual partner) need to be reassessed to determine if screening should resume. Some women have medical problems that increase the chance of getting cervical cancer. In these cases, your health care provider may recommend more frequent screening and Pap tests.  Colorectal cancer can be detected and often prevented. Most routine colorectal cancer screening begins at the age of 50 years and continues through age 75 years. However, your health care provider may recommend screening at an earlier age if you have risk factors for colon cancer. On a yearly basis, your health care provider may provide home test kits to check  for hidden blood in the stool. Use of a small camera at the end of a tube, to directly examine the colon (sigmoidoscopy or colonoscopy), can detect the earliest forms of colorectal cancer. Talk to your health care provider about this at age 50, when routine screening begins. Direct exam of the colon should be repeated every 5-10 years through age 75 years, unless early forms of precancerous polyps or small growths are found.  People who are at an increased risk for hepatitis B should be screened for this virus. You are considered at high risk for hepatitis B if:  You were born in a country where hepatitis B occurs often. Talk with your health care provider about which countries are considered high risk.  Your parents were born in a high-risk country and you have not received a shot to protect against hepatitis B (hepatitis B vaccine).  You have HIV or AIDS.  You use needles to inject street drugs.  You live with, or have sex with, someone who has hepatitis B.  You get hemodialysis treatment.  You take certain medicines for conditions like cancer, organ transplantation, and autoimmune conditions.  Hepatitis C blood testing is recommended for all people born from 1945 through 1965 and any individual with known risks for hepatitis C.  Practice safe sex. Use condoms and avoid high-risk sexual practices to reduce the spread of sexually transmitted infections (STIs). STIs include gonorrhea, chlamydia, syphilis, trichomonas, herpes, HPV, and human immunodeficiency virus (HIV). Herpes, HIV, and HPV are viral illnesses that have no cure. They can result in disability, cancer, and death.  You should be screened for sexually transmitted illnesses (STIs) including gonorrhea and chlamydia if:  You are sexually active and are younger than 24 years.  You are older than 24 years and your health care provider tells you that you are at risk for this type of infection.  Your sexual activity has changed  since you were last screened and you are at an increased risk for chlamydia or gonorrhea. Ask your health care provider if you are at risk.  If you are at risk of being infected with HIV, it is recommended that you take a prescription medicine daily to prevent HIV infection. This is called preexposure prophylaxis (PrEP). You are considered at risk if:  You are sexually active and do not regularly use condoms or know the HIV status of your partner(s).  You take drugs by injection.  You are sexually active with a partner who has HIV.  Talk with your health care provider about whether you are at high risk of being infected with HIV. If   you choose to begin PrEP, you should first be tested for HIV. You should then be tested every 3 months for as long as you are taking PrEP.  Osteoporosis is a disease in which the bones lose minerals and strength with aging. This can result in serious bone fractures or breaks. The risk of osteoporosis can be identified using a bone density scan. Women ages 67 years and over and women at risk for fractures or osteoporosis should discuss screening with their health care providers. Ask your health care provider whether you should take a calcium supplement or vitamin D to reduce the rate of osteoporosis.  Menopause can be associated with physical symptoms and risks. Hormone replacement therapy is available to decrease symptoms and risks. You should talk to your health care provider about whether hormone replacement therapy is right for you.  Use sunscreen. Apply sunscreen liberally and repeatedly throughout the day. You should seek shade when your shadow is shorter than you. Protect yourself by wearing long sleeves, pants, a wide-brimmed hat, and sunglasses year round, whenever you are outdoors.  Once a month, do a whole body skin exam, using a mirror to look at the skin on your back. Tell your health care provider of new moles, moles that have irregular borders, moles that  are larger than a pencil eraser, or moles that have changed in shape or color.  Stay current with required vaccines (immunizations).  Influenza vaccine. All adults should be immunized every year.  Tetanus, diphtheria, and acellular pertussis (Td, Tdap) vaccine. Pregnant women should receive 1 dose of Tdap vaccine during each pregnancy. The dose should be obtained regardless of the length of time since the last dose. Immunization is preferred during the 27th-36th week of gestation. An adult who has not previously received Tdap or who does not know her vaccine status should receive 1 dose of Tdap. This initial dose should be followed by tetanus and diphtheria toxoids (Td) booster doses every 10 years. Adults with an unknown or incomplete history of completing a 3-dose immunization series with Td-containing vaccines should begin or complete a primary immunization series including a Tdap dose. Adults should receive a Td booster every 10 years.  Varicella vaccine. An adult without evidence of immunity to varicella should receive 2 doses or a second dose if she has previously received 1 dose. Pregnant females who do not have evidence of immunity should receive the first dose after pregnancy. This first dose should be obtained before leaving the health care facility. The second dose should be obtained 4-8 weeks after the first dose.  Human papillomavirus (HPV) vaccine. Females aged 13-26 years who have not received the vaccine previously should obtain the 3-dose series. The vaccine is not recommended for use in pregnant females. However, pregnancy testing is not needed before receiving a dose. If a female is found to be pregnant after receiving a dose, no treatment is needed. In that case, the remaining doses should be delayed until after the pregnancy. Immunization is recommended for any person with an immunocompromised condition through the age of 61 years if she did not get any or all doses earlier. During the  3-dose series, the second dose should be obtained 4-8 weeks after the first dose. The third dose should be obtained 24 weeks after the first dose and 16 weeks after the second dose.  Zoster vaccine. One dose is recommended for adults aged 30 years or older unless certain conditions are present.  Measles, mumps, and rubella (MMR) vaccine. Adults born  before 1957 generally are considered immune to measles and mumps. Adults born in 1957 or later should have 1 or more doses of MMR vaccine unless there is a contraindication to the vaccine or there is laboratory evidence of immunity to each of the three diseases. A routine second dose of MMR vaccine should be obtained at least 28 days after the first dose for students attending postsecondary schools, health care workers, or international travelers. People who received inactivated measles vaccine or an unknown type of measles vaccine during 1963-1967 should receive 2 doses of MMR vaccine. People who received inactivated mumps vaccine or an unknown type of mumps vaccine before 1979 and are at high risk for mumps infection should consider immunization with 2 doses of MMR vaccine. For females of childbearing age, rubella immunity should be determined. If there is no evidence of immunity, females who are not pregnant should be vaccinated. If there is no evidence of immunity, females who are pregnant should delay immunization until after pregnancy. Unvaccinated health care workers born before 1957 who lack laboratory evidence of measles, mumps, or rubella immunity or laboratory confirmation of disease should consider measles and mumps immunization with 2 doses of MMR vaccine or rubella immunization with 1 dose of MMR vaccine.  Pneumococcal 13-valent conjugate (PCV13) vaccine. When indicated, a person who is uncertain of his immunization history and has no record of immunization should receive the PCV13 vaccine. All adults 65 years of age and older should receive this  vaccine. An adult aged 19 years or older who has certain medical conditions and has not been previously immunized should receive 1 dose of PCV13 vaccine. This PCV13 should be followed with a dose of pneumococcal polysaccharide (PPSV23) vaccine. Adults who are at high risk for pneumococcal disease should obtain the PPSV23 vaccine at least 8 weeks after the dose of PCV13 vaccine. Adults older than 51 years of age who have normal immune system function should obtain the PPSV23 vaccine dose at least 1 year after the dose of PCV13 vaccine.  Pneumococcal polysaccharide (PPSV23) vaccine. When PCV13 is also indicated, PCV13 should be obtained first. All adults aged 65 years and older should be immunized. An adult younger than age 65 years who has certain medical conditions should be immunized. Any person who resides in a nursing home or long-term care facility should be immunized. An adult smoker should be immunized. People with an immunocompromised condition and certain other conditions should receive both PCV13 and PPSV23 vaccines. People with human immunodeficiency virus (HIV) infection should be immunized as soon as possible after diagnosis. Immunization during chemotherapy or radiation therapy should be avoided. Routine use of PPSV23 vaccine is not recommended for American Indians, [place] Natives, or people younger than 65 years unless there are medical conditions that require PPSV23 vaccine. When indicated, people who have unknown immunization and have no record of immunization should receive PPSV23 vaccine. One-time revaccination 5 years after the first dose of PPSV23 is recommended for people aged 19-64 years who have chronic kidney failure, nephrotic syndrome, asplenia, or immunocompromised conditions. People who received 1-2 doses of PPSV23 before age 65 years should receive another dose of PPSV23 vaccine at age 65 years or later if at least 5 years have passed since the previous dose. Doses of PPSV23 are not  needed for people immunized with PPSV23 at or after age 65 years.  Meningococcal vaccine. Adults with asplenia or persistent complement component deficiencies should receive 2 doses of quadrivalent meningococcal conjugate (MenACWY-D) vaccine. The doses should be obtained   at least 2 months apart. Microbiologists working with certain meningococcal bacteria, [place] recruits, people at risk during an outbreak, and people who travel to or live in countries with a high rate of meningitis should be immunized. A first-year college student up through age 34 years who is living in a residence hall should receive a dose if she did not receive a dose on or after her 16th birthday. Adults who have certain high-risk conditions should receive one or more doses of vaccine.  Hepatitis A vaccine. Adults who wish to be protected from this disease, have certain high-risk conditions, work with hepatitis A-infected animals, work in hepatitis A research labs, or travel to or work in countries with a high rate of hepatitis A should be immunized. Adults who were previously unvaccinated and who anticipate close contact with an international adoptee during the first 60 days after arrival in the [place] from a country with a high rate of hepatitis A should be immunized.  Hepatitis B vaccine. Adults who wish to be protected from this disease, have certain high-risk conditions, may be exposed to blood or other infectious body fluids, are household contacts or sex partners of hepatitis B positive people, are clients or workers in certain care facilities, or travel to or work in countries with a high rate of hepatitis B should be immunized.  Haemophilus influenzae type b (Hib) vaccine. A previously unvaccinated person with asplenia or sickle cell disease or having a scheduled splenectomy should receive 1 dose of Hib vaccine. Regardless of previous immunization, a recipient of a hematopoietic stem cell transplant should receive a  3-dose series 6-12 months after her successful transplant. Hib vaccine is not recommended for adults with HIV infection. Preventive Services / Frequency Ages 35 to 4 years  Blood pressure check.** / Every 3-5 years.  Lipid and cholesterol check.** / Every 5 years beginning at age 60.  Clinical breast exam.** / Every 3 years for women in their 71s and 10s.  BRCA-related cancer risk assessment.** / For women who have family members with a BRCA-related cancer (breast, ovarian, tubal, or peritoneal cancers).  Pap test.** / Every 2 years from ages 76 through 26. Every 3 years starting at age 61 through age 76 or 93 with a history of 3 consecutive normal Pap tests.  HPV screening.** / Every 3 years from ages 37 through ages 60 to 51 with a history of 3 consecutive normal Pap tests.  Hepatitis C blood test.** / For any individual with known risks for hepatitis C.  Skin self-exam. / Monthly.  Influenza vaccine. / Every year.  Tetanus, diphtheria, and acellular pertussis (Tdap, Td) vaccine.** / Consult your health care provider. Pregnant women should receive 1 dose of Tdap vaccine during each pregnancy. 1 dose of Td every 10 years.  Varicella vaccine.** / Consult your health care provider. Pregnant females who do not have evidence of immunity should receive the first dose after pregnancy.  HPV vaccine. / 3 doses over 6 months, if 93 and younger. The vaccine is not recommended for use in pregnant females. However, pregnancy testing is not needed before receiving a dose.  Measles, mumps, rubella (MMR) vaccine.** / You need at least 1 dose of MMR if you were born in 1957 or later. You may also need a 2nd dose. For females of childbearing age, rubella immunity should be determined. If there is no evidence of immunity, females who are not pregnant should be vaccinated. If there is no evidence of immunity, females who are  pregnant should delay immunization until after pregnancy.  Pneumococcal  13-valent conjugate (PCV13) vaccine.** / Consult your health care provider.  Pneumococcal polysaccharide (PPSV23) vaccine.** / 1 to 2 doses if you smoke cigarettes or if you have certain conditions.  Meningococcal vaccine.** / 1 dose if you are age 68 to 8 years and a Market researcher living in a residence hall, or have one of several medical conditions, you need to get vaccinated against meningococcal disease. You may also need additional booster doses.  Hepatitis A vaccine.** / Consult your health care provider.  Hepatitis B vaccine.** / Consult your health care provider.  Haemophilus influenzae type b (Hib) vaccine.** / Consult your health care provider. Ages 7 to 53 years  Blood pressure check.** / Every year.  Lipid and cholesterol check.** / Every 5 years beginning at age 25 years.  Lung cancer screening. / Every year if you are aged 11-80 years and have a 30-pack-year history of smoking and currently smoke or have quit within the past 15 years. Yearly screening is stopped once you have quit smoking for at least 15 years or develop a health problem that would prevent you from having lung cancer treatment.  Clinical breast exam.** / Every year after age 48 years.  BRCA-related cancer risk assessment.** / For women who have family members with a BRCA-related cancer (breast, ovarian, tubal, or peritoneal cancers).  Mammogram.** / Every year beginning at age 41 years and continuing for as long as you are in good health. Consult with your health care provider.  Pap test.** / Every 3 years starting at age 65 years through age 37 or 70 years with a history of 3 consecutive normal Pap tests.  HPV screening.** / Every 3 years from ages 72 years through ages 60 to 40 years with a history of 3 consecutive normal Pap tests.  Fecal occult blood test (FOBT) of stool. / Every year beginning at age 21 years and continuing until age 5 years. You may not need to do this test if you get  a colonoscopy every 10 years.  Flexible sigmoidoscopy or colonoscopy.** / Every 5 years for a flexible sigmoidoscopy or every 10 years for a colonoscopy beginning at age 35 years and continuing until age 48 years.  Hepatitis C blood test.** / For all people born from [birth year] through 1965 and any individual with known risks for hepatitis C.  Skin self-exam. / Monthly.  Influenza vaccine. / Every year.  Tetanus, diphtheria, and acellular pertussis (Tdap/Td) vaccine.** / Consult your health care provider. Pregnant women should receive 1 dose of Tdap vaccine during each pregnancy. 1 dose of Td every 10 years.  Varicella vaccine.** / Consult your health care provider. Pregnant females who do not have evidence of immunity should receive the first dose after pregnancy.  Zoster vaccine.** / 1 dose for adults aged 30 years or older.  Measles, mumps, rubella (MMR) vaccine.** / You need at least 1 dose of MMR if you were born in 1957 or later. You may also need a second dose. For females of childbearing age, rubella immunity should be determined. If there is no evidence of immunity, females who are not pregnant should be vaccinated. If there is no evidence of immunity, females who are pregnant should delay immunization until after pregnancy.  Pneumococcal 13-valent conjugate (PCV13) vaccine.** / Consult your health care provider.  Pneumococcal polysaccharide (PPSV23) vaccine.** / 1 to 2 doses if you smoke cigarettes or if you have certain conditions.  Meningococcal vaccine.** /  Consult your health care provider.  Hepatitis A vaccine.** / Consult your health care provider.  Hepatitis B vaccine.** / Consult your health care provider.  Haemophilus influenzae type b (Hib) vaccine.** / Consult your health care provider. Ages 64 years and over  Blood pressure check.** / Every year.  Lipid and cholesterol check.** / Every 5 years beginning at age 23 years.  Lung cancer screening. / Every year if you  are aged 16-80 years and have a 30-pack-year history of smoking and currently smoke or have quit within the past 15 years. Yearly screening is stopped once you have quit smoking for at least 15 years or develop a health problem that would prevent you from having lung cancer treatment.  Clinical breast exam.** / Every year after age 74 years.  BRCA-related cancer risk assessment.** / For women who have family members with a BRCA-related cancer (breast, ovarian, tubal, or peritoneal cancers).  Mammogram.** / Every year beginning at age 44 years and continuing for as long as you are in good health. Consult with your health care provider.  Pap test.** / Every 3 years starting at age 58 years through age 22 or 39 years with 3 consecutive normal Pap tests. Testing can be stopped between 65 and 70 years with 3 consecutive normal Pap tests and no abnormal Pap or HPV tests in the past 10 years.  HPV screening.** / Every 3 years from ages 64 years through ages 70 or 61 years with a history of 3 consecutive normal Pap tests. Testing can be stopped between 65 and 70 years with 3 consecutive normal Pap tests and no abnormal Pap or HPV tests in the past 10 years.  Fecal occult blood test (FOBT) of stool. / Every year beginning at age 40 years and continuing until age 27 years. You may not need to do this test if you get a colonoscopy every 10 years.  Flexible sigmoidoscopy or colonoscopy.** / Every 5 years for a flexible sigmoidoscopy or every 10 years for a colonoscopy beginning at age 7 years and continuing until age 32 years.  Hepatitis C blood test.** / For all people born from [birth year] through 1966 and any individual with known risks for hepatitis C.  Osteoporosis screening.** / A one-time screening for women ages 30 years and over and women at risk for fractures or osteoporosis.  Skin self-exam. / Monthly.  Influenza vaccine. / Every year.  Tetanus, diphtheria, and acellular pertussis (Tdap/Td)  vaccine.** / 1 dose of Td every 10 years.  Varicella vaccine.** / Consult your health care provider.  Zoster vaccine.** / 1 dose for adults aged 35 years or older.  Pneumococcal 13-valent conjugate (PCV13) vaccine.** / Consult your health care provider.  Pneumococcal polysaccharide (PPSV23) vaccine.** / 1 dose for all adults aged 46 years and older.  Meningococcal vaccine.** / Consult your health care provider.  Hepatitis A vaccine.** / Consult your health care provider.  Hepatitis B vaccine.** / Consult your health care provider.  Haemophilus influenzae type b (Hib) vaccine.** / Consult your health care provider. ** Family history and personal history of risk and conditions may change your health care provider's recommendations.   This information is not intended to replace advice given to you by your health care provider. Make sure you discuss any questions you have with your health care provider.   Document Released: 08/07/2001 Document Revised: 07/02/2014 Document Reviewed: 11/06/2010 Elsevier Interactive Patient Education [employer].

## 2015-08-15 NOTE — Progress Notes (Signed)
Subjective:     Felicia Solis is a 50 y.o. female and is here for a comprehensive physical exam. The patient reports problems - mid back pain close to R scapula and chest pain.  she went to er about 1 month ago and was told it was MS pain.  .  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 Marital Status: Married    Spouse Name: N/A  . Number of Children: 3  . Years of Education: N/A   Occupational History  . CNA    Social History Main Topics  . Smoking status: Never Smoker   . Smokeless tobacco: Never Used  . Alcohol Use: No  . Drug Use: No  . Sexual Activity:    Partners: Male    Birth Control/ Protection: Surgical   Other Topics Concern  . Not on file   Social History Narrative   Health Maintenance  Topic Date Due  . HEMOGLOBIN A1C  05/14/2015  . HIV Screening  11/11/2015 (Originally 10/12/1979)  . PNEUMOCOCCAL POLYSACCHARIDE VACCINE (1) 08/14/2016 (Originally 10/12/1966)  . INFLUENZA VACCINE  08/14/2016 (Originally 01/24/2015)  . TETANUS/TDAP  08/27/2015  . FOOT EXAM  11/11/2015  . OPHTHALMOLOGY EXAM  04/25/2016  . MAMMOGRAM  05/12/2016  . PAP SMEAR  08/06/2017  . COLONOSCOPY  12/02/2024    The following portions of the patient's history were reviewed and updated as appropriate:  She  has a past medical history of Angina; Asthma (5 yrs ago ); Shortness of breath; Tuberculosis; Anemia; Headache(784.0); Depression; Renal disorder; Diabetes ([place]) (2016); Anxiety; Hyperlipidemia; and Thyroid disease. She  does not have any pertinent problems on file. She  has past surgical history that includes Endometrial ablation; Tubal ligation; laparoscopy; Dilation and curettage of uterus (1999); and Thyroidectomy (09/28/2011). Her family history includes Breast cancer in her maternal aunt; Colon cancer in her paternal grandfather; Colon polyps in her father; Diabetes in her brother, mother, and sister; Gallbladder disease in her  maternal grandmother and mother; Heart disease in her other; Hyperlipidemia in her father and mother; Hypertension in her father and mother; Ovarian cancer in her maternal grandmother. There is no history of Anesthesia problems, Hypotension, Malignant hyperthermia, Pseudochol deficiency, or Esophageal cancer. She  reports that she has never smoked. She has never used smokeless tobacco. She reports that she does not drink alcohol or use illicit drugs. She has a current medication list which includes the following prescription(s): acetaminophen, citalopram, glucose blood, losartan, meloxicam, metformin, multivitamin with minerals, onetouch delica lancets fine, pantoprazole, and simvastatin. Current Outpatient Prescriptions on File Prior to Visit  Medication Sig Dispense Refill  . acetaminophen (TYLENOL) 500 MG tablet Take 1,000 mg by mouth every 6 (six) hours as needed for mild pain.    . citalopram (CELEXA) 20 MG tablet Take 1 tablet (20 mg total) by mouth daily. 30 tablet 11  . glucose blood (ONETOUCH VERIO) test strip Onetouch Verio Check blood sugar twice daily 100 each 12  . losartan (COZAAR) 25 MG tablet TAKE ONE TABLET BY MOUTH ONCE DAILY 30 tablet 0  . meloxicam (MOBIC) 15 MG tablet Take 1 tablet (15 mg total) by mouth daily as needed for pain. 21 tablet 0  . metFORMIN (GLUCOPHAGE-XR) 500 MG 24 hr tablet TAKE TWO TABLETS BY MOUTH ONCE DAILY WITH BREAKFAST 60 tablet 0  . Multiple Vitamin (MULTIVITAMIN WITH MINERALS) TABS tablet Take 1 tablet by mouth daily.    Marland Kitchen  ONETOUCH DELICA LANCETS FINE MISC Check blood sugar twice daily 100 each 12  . pantoprazole (PROTONIX) 40 MG tablet Take 1 tablet (40 mg total) by mouth daily. 30 tablet 3  . simvastatin (ZOCOR) 20 MG tablet Take 1 tablet (20 mg total) by mouth at bedtime. (Patient not taking: Reported on 05/31/2015) 30 tablet 2   No current facility-administered medications on file prior to visit.   She is allergic to lisinopril..  Review of  Systems Review of Systems  Constitutional: Negative for activity change, appetite change and fatigue.  HENT: Negative for hearing loss, congestion, tinnitus and ear discharge.  dentist q49mEyes: Negative for visual disturbance (see optho q1y -- vision corrected to 20/20 with glasses).  Respiratory: Negative for cough, chest tightness and shortness of breath.   Cardiovascular: Negative for chest pain, palpitations and leg swelling.  Gastrointestinal: Negative for abdominal pain, diarrhea, constipation and abdominal distention.  Genitourinary: Negative for urgency, frequency, decreased urine volume and difficulty urinating.  Musculoskeletal: Negative for, arthralgias and gait problem.  + back pain Skin: Negative for color change, pallor and rash.  Neurological: Negative for dizziness, light-headedness, numbness and headaches.  Hematological: Negative for adenopathy. Does not bruise/bleed easily.  Psychiatric/Behavioral: Negative for suicidal ideas, confusion, sleep disturbance, self-injury, dysphoric mood, decreased concentration and agitation.       Objective:    BP 124/86 mmHg  Pulse 62  Temp(Src) 98 F (36.7 C) (Oral)  Ht '5\' 6"'$  (1.676 m)  Wt 225 lb (102.059 kg)  BMI 36.33 kg/m2  SpO2 98% General appearance: alert, cooperative, appears stated age and no distress Head: Normocephalic, without obvious abnormality, atraumatic Eyes: conjunctivae/corneas clear. PERRL, EOM's intact. Fundi benign. Ears: normal TM's and external ear canals both ears Nose: Nares normal. Septum midline. Mucosa normal. No drainage or sinus tenderness. Throat: lips, mucosa, and tongue normal; teeth and gums normal Neck: no adenopathy, no carotid bruit, no JVD, supple, symmetrical, trachea midline and thyroid not enlarged, symmetric, no tenderness/mass/nodules Back: symmetric, no curvature. ROM normal. No CVA tenderness. Lungs: clear to auscultation bilaterally Breasts: gyn Heart: regular rate and rhythm,  S1, S2 normal, no murmur, click, rub or gallop Abdomen: soft, non-tender; bowel sounds normal; no masses,  no organomegaly Pelvic: deferred-gyn Extremities: extremities normal, atraumatic, no cyanosis or edema Pulses: 2+ and symmetric Skin: Skin color, texture, turgor normal. No rashes or lesions Lymph nodes: Cervical, supraclavicular, and axillary nodes normal. Neurologic: Alert and oriented X 3, normal strength and tone. Normal symmetric reflexes. Normal coordination and gait Psych- no depression, no anxiety      Assessment:    Healthy female exam.     Plan:    ghm utd Check labs See After Visit Summary for Counseling Recommendations    1. Encounter for screening for HIV  - HIV antibody  2. Preventative health care Check labs ghm utd  - Comp Met (CMET) - CBC with Differential/Platelet - Hemoglobin A1c - Lipid panel - POCT urinalysis dipstick - Microalbumin / creatinine urine ratio - TSH  3. Controlled type 2 diabetes mellitus without complication, without long-term current use of insulin (HCC) Check labs bs around 112-120  - Comp Met (CMET) - CBC with Differential/Platelet - Hemoglobin A1c - Lipid panel - POCT urinalysis dipstick - Microalbumin / creatinine urine ratio - TSH  4. Essential hypertension stable - Comp Met (CMET) - CBC with Differential/Platelet - Hemoglobin A1c - Lipid panel - POCT urinalysis dipstick - Microalbumin / creatinine urine ratio - TSH  5. Hyperlipidemia LDL goal <70   -  Comp Met (CMET) - CBC with Differential/Platelet - Hemoglobin A1c - Lipid panel - POCT urinalysis dipstick - Microalbumin / creatinine urine ratio - TSH  6. Back spasm Stretching  Warm compresses Massage rto prn  - cyclobenzaprine (FLEXERIL) 10 MG tablet; Take 1 tablet (10 mg total) by mouth 3 (three) times daily as needed for muscle spasms.  Dispense: 30 tablet; Refill: 0 - Ambulatory referral to Sports Medicine  7. Knee pain, bilateral  Pt has  mobic at home - Ambulatory referral to Sports Medicine

## 2015-08-16 ENCOUNTER — Other Ambulatory Visit: Payer: Self-pay

## 2015-08-16 DIAGNOSIS — E785 Hyperlipidemia, unspecified: Secondary | ICD-10-CM

## 2015-08-16 MED ORDER — SITAGLIP PHOS-METFORMIN HCL ER 100-1000 MG PO TB24: 1.0000 | ORAL_TABLET | Freq: Every day | ORAL | Status: DC

## 2015-08-16 MED ORDER — SIMVASTATIN 40 MG PO TABS: 40.0000 mg | ORAL_TABLET | Freq: Every day | ORAL | Status: DC

## 2015-08-18 ENCOUNTER — Encounter: Payer: Self-pay | Admitting: Family Medicine

## 2015-08-18 ENCOUNTER — Ambulatory Visit (INDEPENDENT_AMBULATORY_CARE_PROVIDER_SITE_OTHER): Payer: BLUE CROSS/BLUE SHIELD | Admitting: Family Medicine

## 2015-08-18 VITALS — BP 112/78 | HR 76 | Ht 66.0 in | Wt 221.0 lb

## 2015-08-18 DIAGNOSIS — S29019A Strain of muscle and tendon of unspecified wall of thorax, initial encounter: Secondary | ICD-10-CM

## 2015-08-18 DIAGNOSIS — M25562 Pain in left knee: Secondary | ICD-10-CM

## 2015-08-18 DIAGNOSIS — M25561 Pain in right knee: Secondary | ICD-10-CM | POA: Diagnosis not present

## 2015-08-18 DIAGNOSIS — S29009A Unspecified injury of muscle and tendon of unspecified wall of thorax, initial encounter: Secondary | ICD-10-CM

## 2015-08-18 NOTE — Patient Instructions (Signed)
Your knee pain is due to arthritis. These are the 4 classes of medicine you can take for this: Tylenol 500mg  1-2 tabs three times a day for pain. Meloxicam 15mg  daily with food Glucosamine sulfate 750mg  twice a day is a supplement that may help. Capsaicin, aspercreme, or biofreeze topically up to four times a day may also help with pain. Cortisone injections are an option. If cortisone injections do not help, there are different types of shots that may help but they take longer to take effect. It's important that you continue to stay active. Straight leg raises, knee extensions 3 sets of 10 once a day (add ankle weight if these become too easy). Start physical therapy to strengthen muscles around the joint that hurts to take pressure off of the joint itself. Shoe inserts with good arch support may be helpful. Walker or cane if needed. Heat or ice 15 minutes at a time 3-4 times a day as needed to help with pain. Water aerobics and cycling with low resistance are the best two types of exercise for arthritis.  You have a thoracic strain of your back. Start physical therapy for this - do home exercises on days you don't go to therapy. Medications as above. Heat for spasms 15 minutes at a time 3-4 times a day. Follow up with me in 5-6 weeks for both issues.

## 2015-08-22 DIAGNOSIS — M25562 Pain in left knee: Secondary | ICD-10-CM

## 2015-08-22 DIAGNOSIS — M25561 Pain in right knee: Secondary | ICD-10-CM | POA: Insufficient documentation

## 2015-08-22 DIAGNOSIS — S29019A Strain of muscle and tendon of unspecified wall of thorax, initial encounter: Secondary | ICD-10-CM | POA: Insufficient documentation

## 2015-08-22 NOTE — Assessment & Plan Note (Signed)
meloxicam daily.  Start physical therapy and home exercises.  Heat for spasms.  F/u in 5-6 weeks.

## 2015-08-22 NOTE — Progress Notes (Signed)
PCP and consultation requested by: Garnet Koyanagi, DO  Subjective:   HPI: Patient is a 50 y.o. female here for bilateral knee pain, mid-back pain.  Patient reports she's had over 1 year of worsening bilateral anterior knee pain. Right knee worse than left - 3/10 at rest on right, 0/10 left - both can get up to 10/10 level. + swelling. Tried brace on right without much benefit. Taking meloxicam now with mild benefit. No catching, locking, giving out. Pain accelerated and sharp past month. Also with mid-upper back pain, soreness. No skin changes, fever, bowel/bladder dysfunction.  Past Medical History  Diagnosis Date  . Angina     pt states she has occasional chest pains no other sxs.. seen in ed  at  [facility] .. all tsts negative.   . Asthma 5 yrs ago     told she had asthma.. does not remember doctors name.   . Shortness of breath     with exertion  . Tuberculosis     pt states she took 9 months of medicine for positive tb skin test.   . Anemia     anemia  when pregnant.  10 yrs ago.   Marland Kitchen Headache(784.0)     years ago told h/a s were migraines.   . Depression     takes citalopram  . Renal disorder     kidney stones  . Diabetes ([place]) 2016  . Anxiety   . Hyperlipidemia   . Thyroid disease     Current Outpatient Prescriptions on File Prior to Visit  Medication Sig Dispense Refill  . acetaminophen (TYLENOL) 500 MG tablet Take 1,000 mg by mouth every 6 (six) hours as needed for mild pain.    . citalopram (CELEXA) 20 MG tablet Take 1 tablet (20 mg total) by mouth daily. 30 tablet 11  . cyclobenzaprine (FLEXERIL) 10 MG tablet Take 1 tablet (10 mg total) by mouth 3 (three) times daily as needed for muscle spasms. 30 tablet 0  . glucose blood (ONETOUCH VERIO) test strip Onetouch Verio Check blood sugar twice daily 100 each 12  . losartan (COZAAR) 25 MG tablet Take 1 tablet (25 mg total) by mouth daily. 30 tablet 0  . meloxicam (MOBIC) 15 MG tablet Take 1 tablet (15 mg total) by  mouth daily as needed for pain. 21 tablet 0  . Multiple Vitamin (MULTIVITAMIN WITH MINERALS) TABS tablet Take 1 tablet by mouth daily.    Glory Rosebush DELICA LANCETS FINE MISC Check blood sugar twice daily 100 each 12  . pantoprazole (PROTONIX) 40 MG tablet Take 1 tablet (40 mg total) by mouth daily. 30 tablet 3  . simvastatin (ZOCOR) 40 MG tablet Take 1 tablet (40 mg total) by mouth at bedtime. 30 tablet 2  . SitaGLIPtin-MetFORMIN HCl [phone] MG TB24 Take 1 tablet by mouth daily. 30 tablet 2   No current facility-administered medications on file prior to visit.    Past Surgical History  Procedure Laterality Date  . Endometrial ablation    . Tubal ligation    . Laparoscopy      years ago ..pt does not know where surgery was done ... states" my stomach would blow up then go down"  . Dilation and curettage of uterus  1999  . Thyroidectomy  09/28/2011    Procedure: THYROIDECTOMY;  Surgeon: Izora Gala, MD;  Location: [place];  Service: ENT;  Laterality: Right;  RIGHT THYROIDECTOMY WITH FROZEN SECTION    Allergies  Allergen Reactions  . Lisinopril  Cough    Social History   Social History  . Marital Status: Married    Spouse Name: N/A  . Number of Children: 3  . Years of Education: N/A   Occupational History  . CNA    Social History Main Topics  . Smoking status: Never Smoker   . Smokeless tobacco: Never Used  . Alcohol Use: No  . Drug Use: No  . Sexual Activity:    Partners: Male    Birth Control/ Protection: Surgical   Other Topics Concern  . Not on file   Social History Narrative   No exercise--  Walking and lifting pts at work    Family History  Problem Relation Age of Onset  . Diabetes Mother   . Hypertension Mother   . Hyperlipidemia Mother   . Gallbladder disease Mother   . Diabetes Sister   . Hypertension Father   . Hyperlipidemia Father   . Colon polyps Father   . Ovarian cancer Maternal Grandmother   . Gallbladder disease Maternal Grandmother   . Colon  cancer Paternal Grandfather   . Breast cancer Maternal Aunt   . Anesthesia problems Neg Hx   . Hypotension Neg Hx   . Malignant hyperthermia Neg Hx   . Pseudochol deficiency Neg Hx   . Esophageal cancer Neg Hx   . Heart disease Other     early cad  . Diabetes Brother     BP 112/78 mmHg  Pulse 76  Ht 5\' 6"  (1.676 m)  Wt 221 lb (100.245 kg)  BMI 35.69 kg/m2  Review of Systems: See HPI above.    Objective:  Physical Exam:  Gen: NAD, comfortable in exam room  Right knee: No gross deformity, ecchymoses, swelling. TTP lateral joint line, post patellar facets. FROM. Negative ant/post drawers. Negative valgus/varus testing. Negative lachmanns. Negative mcmurrays, apleys, patellar apprehension. NV intact distally.  Left knee: No gross deformity, ecchymoses, swelling. TTP medial joint line and post patellar facets. FROM. Negative ant/post drawers. Negative valgus/varus testing. Negative lachmanns. Negative mcmurrays, apleys, patellar apprehension. NV intact distally.  Back: No gross deformity, scoliosis. TTP bilateral thoracic paraspinal regions.  No midline or bony TTP. FROM. Strength LEs 5/5 all muscle groups.   Negative SLRs. Negative logroll bilateral hips    Assessment & Plan:  1. Bilateral knee pain - 2/2 DJD.  Continue with meloxicam.  Discussed tylenol, glucosamine, topical medications.  Shown home exercises to do daily.  Consider injections if not improving.  Start physical therapy.  Heat or ice if needed.  F/u in 5-6 weeks.  2. Thoracic strain - meloxicam daily.  Start physical therapy and home exercises.  Heat for spasms.  F/u in 5-6 weeks.

## 2015-08-22 NOTE — Assessment & Plan Note (Signed)
2/2 DJD.  Continue with meloxicam.  Discussed tylenol, glucosamine, topical medications.  Shown home exercises to do daily.  Consider injections if not improving.  Start physical therapy.  Heat or ice if needed.  F/u in 5-6 weeks.

## 2015-08-24 ENCOUNTER — Telehealth: Payer: Self-pay | Admitting: Family Medicine

## 2015-08-24 NOTE — Telephone Encounter (Signed)
Relation to [id number]  Call back [phone]   Reason for call: patient states she doesn't know what medication is causing her hair loss.

## 2015-08-24 NOTE — Telephone Encounter (Signed)
Spoke with patient and she stated she has been losing a lot of hair for several months and thinks it could be one of her med's, she wants to know what one do you think it could be? And if you had any suggestions? Please advise    KP

## 2015-08-24 NOTE — Telephone Encounter (Signed)
The pharmacist advised it could be lisinopril.    KP

## 2015-08-25 NOTE — Telephone Encounter (Signed)
CPE was 2/20 and her TSH was WNL.     KP

## 2015-08-25 NOTE — Telephone Encounter (Signed)
Mailbox full, unable to accept messages.    KP

## 2015-08-25 NOTE — Telephone Encounter (Signed)
Could be but we need to check labs too --thyroid.   Is she eating enough protein? May need ov

## 2015-08-25 NOTE — Telephone Encounter (Signed)
Is she eating enough protein? Pt is not on Lisnopril

## 2015-08-30 ENCOUNTER — Telehealth: Payer: Self-pay | Admitting: Family Medicine

## 2015-08-30 NOTE — Telephone Encounter (Signed)
Pt returned your call. She says that she has cleared her vm so that she can accept vm now.

## 2015-08-30 NOTE — Telephone Encounter (Signed)
Spoke with patient and she said the Lisinopril was in error, she said the Celexa could cause the hair loss and she is eating enough protein. She said she is scheduled to see a Dermatologist tomorrow because she is also itching all over.     KP

## 2015-08-30 NOTE — Telephone Encounter (Signed)
Error

## 2015-09-06 DIAGNOSIS — G43909 Migraine, unspecified, not intractable, without status migrainosus: Secondary | ICD-10-CM | POA: Insufficient documentation

## 2015-09-21 ENCOUNTER — Ambulatory Visit: Payer: Self-pay | Admitting: Family Medicine

## 2015-09-23 ENCOUNTER — Other Ambulatory Visit: Payer: Self-pay | Admitting: Otolaryngology

## 2015-09-23 DIAGNOSIS — E049 Nontoxic goiter, unspecified: Secondary | ICD-10-CM

## 2015-09-28 ENCOUNTER — Other Ambulatory Visit: Payer: Self-pay | Admitting: Family Medicine

## 2015-12-01 ENCOUNTER — Encounter: Payer: Self-pay | Admitting: Family Medicine

## 2015-12-01 ENCOUNTER — Ambulatory Visit (INDEPENDENT_AMBULATORY_CARE_PROVIDER_SITE_OTHER): Payer: BLUE CROSS/BLUE SHIELD | Admitting: Family Medicine

## 2015-12-01 VITALS — BP 110/80 | HR 63 | Temp 98.0°F | Wt 226.2 lb

## 2015-12-01 DIAGNOSIS — M25561 Pain in right knee: Secondary | ICD-10-CM | POA: Diagnosis not present

## 2015-12-01 DIAGNOSIS — M722 Plantar fascial fibromatosis: Secondary | ICD-10-CM | POA: Diagnosis not present

## 2015-12-01 DIAGNOSIS — M1711 Unilateral primary osteoarthritis, right knee: Secondary | ICD-10-CM

## 2015-12-01 NOTE — Patient Instructions (Signed)
Plantar Fasciitis Plantar fasciitis is a painful foot condition that affects the heel. It occurs when the band of tissue that connects the toes to the heel bone (plantar fascia) becomes irritated. This can happen after exercising too much or doing other repetitive activities (overuse injury). The pain from plantar fasciitis can range from mild irritation to severe pain that makes it difficult for you to walk or move. The pain is usually worse in the morning or after you have been sitting or lying down for a while. CAUSES This condition may be caused by:  Standing for long periods of time.  Wearing shoes that do not fit.  Doing high-impact activities, including running, aerobics, and ballet.  Being overweight.  Having an abnormal way of walking (gait).  Having tight calf muscles.  Having high arches in your feet.  Starting a new athletic activity. SYMPTOMS The main symptom of this condition is heel pain. Other symptoms include:  Pain that gets worse after activity or exercise.  Pain that is worse in the morning or after resting.  Pain that goes away after you walk for a few minutes. DIAGNOSIS This condition may be diagnosed based on your signs and symptoms. Your health care provider will also do a physical exam to check for:  A tender area on the bottom of your foot.  A high arch in your foot.  Pain when you move your foot.  Difficulty moving your foot. You may also need to have imaging studies to confirm the diagnosis. These can include:  X-rays.  Ultrasound.  MRI. TREATMENT  Treatment for plantar fasciitis depends on the severity of the condition. Your treatment may include:  Rest, ice, and over-the-counter pain medicines to manage your pain.  Exercises to stretch your calves and your plantar fascia.  A splint that holds your foot in a stretched, upward position while you sleep (night splint).  Physical therapy to relieve symptoms and prevent problems in the  future.  Cortisone injections to relieve severe pain.  Extracorporeal shock wave therapy (ESWT) to stimulate damaged plantar fascia with electrical impulses. It is often used as a last resort before surgery.  Surgery, if other treatments have not worked after 12 months. HOME CARE INSTRUCTIONS  Take medicines only as directed by your health care provider.  Avoid activities that cause pain.  Roll the bottom of your foot over a bag of ice or a bottle of cold water. Do this for 20 minutes, 3-4 times a day.  Perform simple stretches as directed by your health care provider.  Try wearing athletic shoes with air-sole or gel-sole cushions or soft shoe inserts.  Wear a night splint while sleeping, if directed by your health care provider.  Keep all follow-up appointments with your health care provider. PREVENTION   Do not perform exercises or activities that cause heel pain.  Consider finding low-impact activities if you continue to have problems.  Lose weight if you need to. The best way to prevent plantar fasciitis is to avoid the activities that aggravate your plantar fascia. SEEK MEDICAL CARE IF:  Your symptoms do not go away after treatment with home care measures.  Your pain gets worse.  Your pain affects your ability to move or do your daily activities.   This information is not intended to replace advice given to you by your health care provider. Make sure you discuss any questions you have with your health care provider.   Document Released: 03/06/2001 Document Revised: 03/02/2015 Document Reviewed: 04/21/2014 Elsevier   Interactive Patient Education 2016 [employer].  

## 2015-12-01 NOTE — Progress Notes (Signed)
Patient ID: Felicia Solis, female    DOB: 1966/06/09  Age: 50 y.o. MRN: [id number]    Subjective:  Subjective HPI Felicia Solis presents for R heel pain---  Pain is worse in am and better with time then if she sits down and get backs up it hurts again.  It then started to effect her R ankle and hip --- no known injury  Review of Systems  Constitutional: Negative for diaphoresis, appetite change, fatigue and unexpected weight change.  Eyes: Negative for pain, redness and visual disturbance.  Respiratory: Negative for cough, chest tightness, shortness of breath and wheezing.   Cardiovascular: Negative for chest pain, palpitations and leg swelling.  Endocrine: Negative for cold intolerance, heat intolerance, polydipsia, polyphagia and polyuria.  Genitourinary: Negative for dysuria, frequency and difficulty urinating.  Musculoskeletal: Positive for gait problem.  Neurological: Negative for dizziness, light-headedness, numbness and headaches.    History Past Medical History  Diagnosis Date  . Angina     pt states she has occasional chest pains no other sxs.. seen in ed  at  [facility] .. all tsts negative.   . Asthma 5 yrs ago     told she had asthma.. does not remember doctors name.   . Shortness of breath     with exertion  . Tuberculosis     pt states she took 9 months of medicine for positive tb skin test.   . Anemia     anemia  when pregnant.  10 yrs ago.   Marland Kitchen Headache(784.0)     years ago told h/a s were migraines.   . Depression     takes citalopram  . Renal disorder     kidney stones  . Diabetes ([place]) 2016  . Anxiety   . Hyperlipidemia   . Thyroid disease     She has past surgical history that includes Endometrial ablation; Tubal ligation; laparoscopy; Dilation and curettage of uterus (1999); and Thyroidectomy (09/28/2011).   Her family history includes Breast cancer in her maternal aunt; Colon cancer in her paternal grandfather; Colon polyps in her father; Diabetes in  her brother, mother, and sister; Gallbladder disease in her maternal grandmother and mother; Heart disease in her other; Hyperlipidemia in her father and mother; Hypertension in her father and mother; Ovarian cancer in her maternal grandmother. There is no history of Anesthesia problems, Hypotension, Malignant hyperthermia, Pseudochol deficiency, or Esophageal cancer.She reports that she has never smoked. She has never used smokeless tobacco. She reports that she does not drink alcohol or use illicit drugs.  Current Outpatient Prescriptions on File Prior to Visit  Medication Sig Dispense Refill  . acetaminophen (TYLENOL) 500 MG tablet Take 1,000 mg by mouth every 6 (six) hours as needed for mild pain.    . citalopram (CELEXA) 20 MG tablet Take 1 tablet (20 mg total) by mouth daily. 30 tablet 11  . cyclobenzaprine (FLEXERIL) 10 MG tablet Take 1 tablet (10 mg total) by mouth 3 (three) times daily as needed for muscle spasms. 30 tablet 0  . glucose blood (ONETOUCH VERIO) test strip Onetouch Verio Check blood sugar twice daily 100 each 12  . losartan (COZAAR) 25 MG tablet TAKE ONE TABLET BY MOUTH ONCE DAILY 30 tablet 5  . meloxicam (MOBIC) 15 MG tablet Take 1 tablet (15 mg total) by mouth daily as needed for pain. 21 tablet 0  . Multiple Vitamin (MULTIVITAMIN WITH MINERALS) TABS tablet Take 1 tablet by mouth daily.    Jonetta Speak  LANCETS FINE MISC Check blood sugar twice daily 100 each 12  . pantoprazole (PROTONIX) 40 MG tablet Take 1 tablet (40 mg total) by mouth daily. 30 tablet 3  . simvastatin (ZOCOR) 40 MG tablet Take 1 tablet (40 mg total) by mouth at bedtime. 30 tablet 2  . SitaGLIPtin-MetFORMIN HCl [phone] MG TB24 Take 1 tablet by mouth daily. 30 tablet 2   No current facility-administered medications on file prior to visit.     Objective:  Objective Physical Exam  Constitutional: She is oriented to person, place, and time. She appears well-developed and well-nourished.  HENT:    Head: Normocephalic and atraumatic.  Eyes: Conjunctivae and EOM are normal.  Neck: Normal range of motion. Neck supple. No JVD present. Carotid bruit is not present. No thyromegaly present.  Cardiovascular: Normal rate, regular rhythm and normal heart sounds.   No murmur heard. Pulmonary/Chest: Effort normal and breath sounds normal. No respiratory distress. She has no wheezes. She has no rales. She exhibits no tenderness.  Musculoskeletal: She exhibits tenderness. She exhibits no edema.       Right hip: She exhibits tenderness. She exhibits normal range of motion, normal strength and no swelling.       Feet:  Neurological: She is alert and oriented to person, place, and time.  Psychiatric: She has a normal mood and affect.  Nursing note and vitals reviewed.  BP 110/80 mmHg  Pulse 63  Temp(Src) 98 F (36.7 C) (Oral)  Wt 226 lb 3.2 oz (102.604 kg)  SpO2 97% Wt Readings from Last 3 Encounters:  12/01/15 226 lb 3.2 oz (102.604 kg)  08/18/15 221 lb (100.245 kg)  08/15/15 225 lb (102.059 kg)     Lab Results  Component Value Date   WBC 6.6 08/15/2015   HGB 13.0 08/15/2015   HCT 39.6 08/15/2015   PLT 297.0 08/15/2015   GLUCOSE 131* 08/15/2015   CHOL 188 08/15/2015   TRIG 82.0 08/15/2015   HDL 39.20 08/15/2015   LDLCALC 132* 08/15/2015   ALT 19 08/15/2015   AST 16 08/15/2015   NA 140 08/15/2015   K 3.8 08/15/2015   CL 105 08/15/2015   CREATININE 0.47 08/15/2015   BUN 10 08/15/2015   CO2 27 08/15/2015   TSH 1.06 08/15/2015   HGBA1C 7.1* 08/15/2015   MICROALBUR 1.2 08/15/2015    Dg Chest 2 View  07/06/2015  CLINICAL DATA:  Acute right-sided chest pain. EXAM: CHEST  2 VIEW COMPARISON:  March 09, 2015. FINDINGS: The heart size and mediastinal contours are within normal limits. Both lungs are clear. No pneumothorax or pleural effusion is noted. The visualized skeletal structures are unremarkable. IMPRESSION: No active cardiopulmonary disease. Electronically Signed   By:  Marijo Conception, M.D.   On: 07/06/2015 22:49     Assessment & Plan:  Plan I am having Felicia Solis maintain her acetaminophen, multivitamin with minerals, glucose blood, ONETOUCH DELICA LANCETS FINE, cyclobenzaprine, pantoprazole, meloxicam, citalopram, simvastatin, SitaGLIPtin-MetFORMIN HCl, and losartan.  No orders of the defined types were placed in this encounter.    Problem List Items Addressed This Visit    Knee pain, right    F/u sport med      Plantar fasciitis of right foot - Primary    Ice , stretch Gel inserts Change shoes Consider podiatry prn       Other Visit Diagnoses    Primary osteoarthritis of right knee           Follow-up: Return if symptoms worsen  or fail to improve.  [name], DO

## 2015-12-01 NOTE — Progress Notes (Signed)
Pre visit review using our clinic review tool, if applicable. No additional management support is needed unless otherwise documented below in the visit note. 

## 2015-12-01 NOTE — Assessment & Plan Note (Signed)
F/u sport med 

## 2015-12-01 NOTE — Assessment & Plan Note (Signed)
Ice , stretch Gel inserts Change shoes Consider podiatry prn

## 2015-12-21 ENCOUNTER — Other Ambulatory Visit: Payer: Self-pay | Admitting: Family Medicine

## 2016-01-04 ENCOUNTER — Other Ambulatory Visit: Payer: Self-pay | Admitting: Family Medicine

## 2016-02-10 ENCOUNTER — Encounter: Payer: Self-pay | Admitting: Family Medicine

## 2016-02-10 ENCOUNTER — Ambulatory Visit (INDEPENDENT_AMBULATORY_CARE_PROVIDER_SITE_OTHER): Payer: BLUE CROSS/BLUE SHIELD | Admitting: Family Medicine

## 2016-02-10 ENCOUNTER — Ambulatory Visit (HOSPITAL_BASED_OUTPATIENT_CLINIC_OR_DEPARTMENT_OTHER)
Admission: RE | Admit: 2016-02-10 | Discharge: 2016-02-10 | Disposition: A | Discharge status: Home / Self Care | Payer: BLUE CROSS/BLUE SHIELD | Source: Ambulatory Visit | Attending: Family Medicine | Admitting: Family Medicine

## 2016-02-10 VITALS — BP 122/80 | Temp 98.6°F | Wt 226.6 lb

## 2016-02-10 DIAGNOSIS — E1165 Type 2 diabetes mellitus with hyperglycemia: Secondary | ICD-10-CM | POA: Diagnosis not present

## 2016-02-10 DIAGNOSIS — E785 Hyperlipidemia, unspecified: Secondary | ICD-10-CM

## 2016-02-10 DIAGNOSIS — I10 Essential (primary) hypertension: Secondary | ICD-10-CM | POA: Diagnosis not present

## 2016-02-10 DIAGNOSIS — F411 Generalized anxiety disorder: Secondary | ICD-10-CM

## 2016-02-10 DIAGNOSIS — E1151 Type 2 diabetes mellitus with diabetic peripheral angiopathy without gangrene: Secondary | ICD-10-CM | POA: Diagnosis not present

## 2016-02-10 DIAGNOSIS — R079 Chest pain, unspecified: Secondary | ICD-10-CM | POA: Insufficient documentation

## 2016-02-10 DIAGNOSIS — M62830 Muscle spasm of back: Secondary | ICD-10-CM

## 2016-02-10 DIAGNOSIS — IMO0002 Reserved for concepts with insufficient information to code with codable children: Secondary | ICD-10-CM

## 2016-02-10 DIAGNOSIS — K219 Gastro-esophageal reflux disease without esophagitis: Secondary | ICD-10-CM

## 2016-02-10 LAB — COMPREHENSIVE METABOLIC PANEL
ALK PHOS: 75 U/L (ref 39–117)
ALT: 22 U/L (ref 0–35)
AST: 17 U/L (ref 0–37)
Albumin: 4.3 g/dL (ref 3.5–5.2)
BILIRUBIN TOTAL: 0.4 mg/dL (ref 0.2–1.2)
BUN: 9 mg/dL (ref 6–23)
CALCIUM: 10.1 mg/dL (ref 8.4–10.5)
CO2: 29 meq/L (ref 19–32)
Chloride: 107 mEq/L (ref 96–112)
Creatinine, Ser: 0.5 mg/dL (ref 0.40–1.20)
GFR: 167.06 mL/min (ref >60.00)
GLUCOSE: 117 mg/dL — AB (ref 70–99)
POTASSIUM: 3.8 meq/L (ref 3.5–5.1)
Sodium: 141 mEq/L (ref 135–145)
Total Protein: 7 g/dL (ref 6.0–8.3)

## 2016-02-10 LAB — LIPID PANEL
CHOL/HDL RATIO: 5
Cholesterol: 187 mg/dL (ref 0–200)
HDL: 38 mg/dL — AB (ref >39.00)
LDL Cholesterol: 126 mg/dL — ABNORMAL HIGH (ref 0–99)
NONHDL: 148.77
TRIGLYCERIDES: 116 mg/dL (ref 0.0–149.0)
VLDL: 23.2 mg/dL (ref 0.0–40.0)

## 2016-02-10 LAB — D-DIMER, QUANTITATIVE: D-Dimer, Quant: 0.45 mcg/mL FEU (ref <0.50)

## 2016-02-10 LAB — TROPONIN I: TNIDX: 0.01 ug/L (ref 0.00–0.06)

## 2016-02-10 LAB — HEMOGLOBIN A1C: Hgb A1c MFr Bld: 7.4 % — ABNORMAL HIGH (ref 4.6–6.5)

## 2016-02-10 MED ORDER — SITAGLIP PHOS-METFORMIN HCL ER 100-1000 MG PO TB24: ORAL_TABLET | ORAL | 2 refills | Status: DC

## 2016-02-10 MED ORDER — CYCLOBENZAPRINE HCL 10 MG PO TABS: 10.0000 mg | ORAL_TABLET | Freq: Three times a day (TID) | ORAL | 0 refills | Status: DC | PRN

## 2016-02-10 NOTE — Patient Instructions (Signed)

## 2016-02-10 NOTE — Progress Notes (Signed)
Pre visit review using our clinic review tool, if applicable. No additional management support is needed unless otherwise documented below in the visit note. 

## 2016-02-10 NOTE — Progress Notes (Signed)
Patient ID: Felicia Solis, female    DOB: Nov 10, 1965  Age: 50 y.o. MRN: [id number]    Subjective:  Subjective  HPI Felicia Solis presents for back / chest pain x 1 week-- pain is at rest.  It takes her breath away and last about 5 minutes.  She tried drinking a coke and burping helped 1x but not all the time.  Pt states she did not tell anyone on the phone she had chest pain because she know she would be sent to ER.   Pt has not been compliant with her meds--- takes them sporadically.   Review of Systems  Constitutional: Negative for appetite change, diaphoresis, fatigue and unexpected weight change.  Eyes: Negative for pain, redness and visual disturbance.  Respiratory: Negative for cough, chest tightness, shortness of breath and wheezing.   Cardiovascular: Positive for chest pain. Negative for palpitations and leg swelling.  Endocrine: Negative for cold intolerance, heat intolerance, polydipsia, polyphagia and polyuria.  Genitourinary: Negative for difficulty urinating, dysuria and frequency.  Musculoskeletal: Positive for back pain.  Neurological: Negative for dizziness, light-headedness, numbness and headaches.     History Past Medical History:  Diagnosis Date  . Anemia    anemia  when pregnant.  10 yrs ago.   . Angina    pt states she has occasional chest pains no other sxs.. seen in ed  at  [facility] .. all tsts negative.   Marland Kitchen Anxiety   . Asthma 5 yrs ago    told she had asthma.. does not remember doctors name.   . Depression    takes citalopram  . Diabetes ([place]) 2016  . Headache(784.0)    years ago told h/a s were migraines.   . Hyperlipidemia   . Renal disorder    kidney stones  . Shortness of breath    with exertion  . Thyroid disease   . Tuberculosis    pt states she took 9 months of medicine for positive tb skin test.     She has a past surgical history that includes Endometrial ablation; Tubal ligation; laparoscopy; Dilation and curettage of uterus (1999); and  Thyroidectomy (09/28/2011).   Her family history includes Breast cancer in her maternal aunt; Colon cancer in her paternal grandfather; Colon polyps in her father; Diabetes in her brother, mother, and sister; Gallbladder disease in her maternal grandmother and mother; Heart disease in her other; Hyperlipidemia in her father and mother; Hypertension in her father and mother; Ovarian cancer in her maternal grandmother.She reports that she has never smoked. She has never used smokeless tobacco. She reports that she does not drink alcohol or use drugs.  Current Outpatient Prescriptions on File Prior to Visit  Medication Sig Dispense Refill  . acetaminophen (TYLENOL) 500 MG tablet Take 1,000 mg by mouth every 6 (six) hours as needed for mild pain.    Marland Kitchen glucose blood (ONETOUCH VERIO) test strip Onetouch Verio Check blood sugar twice daily 100 each 12  . Multiple Vitamin (MULTIVITAMIN WITH MINERALS) TABS tablet Take 1 tablet by mouth daily.    Glory Rosebush DELICA LANCETS FINE MISC Check blood sugar twice daily 100 each 12   No current facility-administered medications on file prior to visit.      Objective:  Objective  Physical Exam  Constitutional: She is oriented to person, place, and time. She appears well-developed and well-nourished.  HENT:  Head: Normocephalic and atraumatic.  Eyes: Conjunctivae and EOM are normal.  Neck: Normal range of motion. Neck supple.  No JVD present. Carotid bruit is not present. No thyromegaly present.  Cardiovascular: Normal rate, regular rhythm and normal heart sounds.   No murmur heard. Pulmonary/Chest: Effort normal and breath sounds normal. No respiratory distress. She has no wheezes. She has no rales. She exhibits no tenderness.  Musculoskeletal: She exhibits tenderness. She exhibits no edema.       Arms: Neurological: She is alert and oriented to person, place, and time.  Psychiatric: She has a normal mood and affect.   BP 122/80 (BP Location: Left Arm,  Patient Position: Sitting, Cuff Size: Large)   Temp 98.6 F (37 C) (Oral)   Wt 226 lb 9.6 oz (102.8 kg)   SpO2 96%   BMI 36.57 kg/m  Wt Readings from Last 3 Encounters:  02/10/16 226 lb 9.6 oz (102.8 kg)  12/01/15 226 lb 3.2 oz (102.6 kg)  08/18/15 221 lb (100.2 kg)    ekg--  No acute changes Lab Results  Component Value Date   WBC 6.6 08/15/2015   HGB 13.0 08/15/2015   HCT 39.6 08/15/2015   PLT 297.0 08/15/2015   GLUCOSE 117 (H) 02/10/2016   CHOL 187 02/10/2016   TRIG 116.0 02/10/2016   HDL 38.00 (L) 02/10/2016   LDLCALC 126 (H) 02/10/2016   ALT 22 02/10/2016   AST 17 02/10/2016   NA 141 02/10/2016   K 3.8 02/10/2016   CL 107 02/10/2016   CREATININE 0.50 02/10/2016   BUN 9 02/10/2016   CO2 29 02/10/2016   TSH 1.06 08/15/2015   HGBA1C 7.4 (H) 02/10/2016   MICROALBUR 1.2 08/15/2015    Dg Chest 2 View  Result Date: 07/06/2015 CLINICAL DATA:  Acute right-sided chest pain. EXAM: CHEST  2 VIEW COMPARISON:  March 09, 2015. FINDINGS: The heart size and mediastinal contours are within normal limits. Both lungs are clear. No pneumothorax or pleural effusion is noted. The visualized skeletal structures are unremarkable. IMPRESSION: No active cardiopulmonary disease. Electronically Signed   By: Marijo Conception, M.D.   On: 07/06/2015 22:49   EKG--NSR, no changes   Assessment & Plan:  Plan  I have discontinued Ms. Kurka's meloxicam, SitaGLIPtin-MetFORMIN HCl, and metFORMIN. I have also changed her losartan. Additionally, I am having her start on SitaGLIPtin-MetFORMIN HCl. Lastly, I am having her maintain her acetaminophen, multivitamin with minerals, glucose blood, ONETOUCH DELICA LANCETS FINE, cyanocobalamin, multivitamin, Ginkgo Biloba Extract, Biotin, (Flaxseed, Linseed, (FLAXSEED OIL PO)), Fish Oil, Vitamin D3, cyclobenzaprine, simvastatin, pantoprazole, and citalopram.  Meds ordered this encounter  Medications  . cyanocobalamin 500 MCG tablet    Sig: Take 500 mcg by  mouth daily.  Marland Kitchen DISCONTD: metFORMIN (GLUCOPHAGE-XR) 500 MG 24 hr tablet    Sig: Take 1,000 mg by mouth daily with breakfast.  . Multiple Vitamin (MULTIVITAMIN) tablet    Sig: Take 1 tablet by mouth daily.  . Ginkgo Biloba Extract 60 MG CAPS    Sig: Take 60 mg by mouth daily.  . Biotin 5000 MCG CAPS    Sig: Take 1 capsule by mouth daily.  . Flaxseed, Linseed, (FLAXSEED OIL PO)    Sig: Take 1,300 mg by mouth daily.  . Omega-3 Fatty Acids (FISH OIL) 1200 MG CAPS    Sig: Take 1,200 mg by mouth daily.  . Cholecalciferol (VITAMIN D3) 2000 units TABS    Sig: Take 1 tablet by mouth daily.  . SitaGLIPtin-MetFORMIN HCl (JANUMET XR) [id number] MG TB24    Sig: 1 po qd    Dispense:  60 tablet  Refill:  2  . cyclobenzaprine (FLEXERIL) 10 MG tablet    Sig: Take 1 tablet (10 mg total) by mouth 3 (three) times daily as needed for muscle spasms.    Dispense:  30 tablet    Refill:  0  . simvastatin (ZOCOR) 40 MG tablet    Sig: Take 1 tablet (40 mg total) by mouth at bedtime.    Dispense:  30 tablet    Refill:  2  . pantoprazole (PROTONIX) 40 MG tablet    Sig: Take 1 tablet (40 mg total) by mouth daily.    Dispense:  30 tablet    Refill:  3  . losartan (COZAAR) 25 MG tablet    Sig: Take 1 tablet (25 mg total) by mouth daily.    Dispense:  30 tablet    Refill:  5  . citalopram (CELEXA) 20 MG tablet    Sig: Take 1 tablet (20 mg total) by mouth daily.    Dispense:  30 tablet    Refill:  11    Problem List Items Addressed This Visit      Unprioritized   Chest pain - Primary    Check labs, echo and stress test If chest pain returns --- go to ER        Relevant Orders   EKG 12-Lead (Completed)   D-Dimer, Quantitative (Completed)   Troponin I (Completed)   DG Chest 2 View (Completed)   ECHOCARDIOGRAM COMPLETE   Myocardial Perfusion Imaging    Other Visit Diagnoses    Hyperlipidemia       Relevant Medications   simvastatin (ZOCOR) 40 MG tablet   losartan (COZAAR) 25 MG tablet    Other Relevant Orders   Lipid panel (Completed)   POCT urinalysis dipstick   Essential hypertension       Relevant Medications   simvastatin (ZOCOR) 40 MG tablet   losartan (COZAAR) 25 MG tablet   Other Relevant Orders   Comprehensive metabolic panel (Completed)   POCT urinalysis dipstick   ECHOCARDIOGRAM COMPLETE   Myocardial Perfusion Imaging   DM (diabetes mellitus) type II uncontrolled, periph vascular disorder (HCC)       Relevant Medications   SitaGLIPtin-MetFORMIN HCl (JANUMET XR) [phone] MG TB24   simvastatin (ZOCOR) 40 MG tablet   losartan (COZAAR) 25 MG tablet   Other Relevant Orders   Comprehensive metabolic panel (Completed)   Hemoglobin A1c (Completed)   POCT urinalysis dipstick   Myocardial Perfusion Imaging   Back spasm       Relevant Medications   cyclobenzaprine (FLEXERIL) 10 MG tablet   Gastroesophageal reflux disease, esophagitis presence not specified       Relevant Medications   pantoprazole (PROTONIX) 40 MG tablet   Generalized anxiety disorder       Relevant Medications   citalopram (CELEXA) 20 MG tablet      Follow-up: Return in about 3 months (around 05/12/2016).  [name], DO

## 2016-02-11 ENCOUNTER — Encounter: Payer: Self-pay | Admitting: Family Medicine

## 2016-02-11 MED ORDER — PANTOPRAZOLE SODIUM 40 MG PO TBEC: 40.0000 mg | DELAYED_RELEASE_TABLET | Freq: Every day | ORAL | 3 refills | Status: DC

## 2016-02-11 MED ORDER — SIMVASTATIN 40 MG PO TABS: 40.0000 mg | ORAL_TABLET | Freq: Every day | ORAL | 2 refills | Status: DC

## 2016-02-11 MED ORDER — CITALOPRAM HYDROBROMIDE 20 MG PO TABS: 20.0000 mg | ORAL_TABLET | Freq: Every day | ORAL | 11 refills | Status: DC

## 2016-02-11 MED ORDER — LOSARTAN POTASSIUM 25 MG PO TABS: 25.0000 mg | ORAL_TABLET | Freq: Every day | ORAL | 5 refills | Status: DC

## 2016-02-11 NOTE — Assessment & Plan Note (Signed)
Check labs, echo and stress test If chest pain returns --- go to ER

## 2016-02-13 ENCOUNTER — Ambulatory Visit: Payer: Self-pay | Admitting: Family Medicine

## 2016-02-15 ENCOUNTER — Telehealth: Payer: Self-pay | Admitting: Family Medicine

## 2016-02-15 DIAGNOSIS — R079 Chest pain, unspecified: Secondary | ICD-10-CM

## 2016-02-15 NOTE — Telephone Encounter (Signed)
Insurance denied Echo and Myocardial due to not meeting medical guidelines

## 2016-02-15 NOTE — Telephone Encounter (Signed)
Please advise      KP 

## 2016-02-16 NOTE — Telephone Encounter (Signed)
If chest pain cont-- refer to cardiology Ins will not approve testing

## 2016-02-16 NOTE — Telephone Encounter (Signed)
What are their medical guidelines?

## 2016-02-16 NOTE — Telephone Encounter (Signed)
Yes that is correct, but it was not abnormal per their protocol.

## 2016-02-16 NOTE — Telephone Encounter (Signed)
We did an ekg that day

## 2016-02-16 NOTE — Telephone Encounter (Signed)
Myocardial-A recent EKG is strongly recommended, preferably within 30 days of request for a Myocardial Perfusion Imaging exam. The findings on the resting EKG may be important in determining the need for imaging, the selection of the appropriate imaging protocol and may also show evidence of ischemia at rest or interval myocardial infarction.  Echo-A recent EKG is strongly recommended, preferably within 7 days of request for stress echocardiogram. The findings on the resting EKG may help to determine the need for imaging and may also show evidence of ischemia at rest or interval myocardial infarction.

## 2016-02-20 NOTE — Telephone Encounter (Signed)
Message left to call the office.    KP 

## 2016-02-22 NOTE — Telephone Encounter (Signed)
Discussed with the patient and she verbalized understanding, she wanted to go ahead and see the cardiologist.ref has been placed.     KP

## 2016-03-02 DIAGNOSIS — M722 Plantar fascial fibromatosis: Secondary | ICD-10-CM | POA: Diagnosis not present

## 2016-03-06 ENCOUNTER — Encounter: Payer: Self-pay | Admitting: Internal Medicine

## 2016-03-07 ENCOUNTER — Encounter (INDEPENDENT_AMBULATORY_CARE_PROVIDER_SITE_OTHER): Payer: Self-pay

## 2016-03-07 ENCOUNTER — Encounter: Payer: Self-pay | Admitting: Internal Medicine

## 2016-03-07 ENCOUNTER — Ambulatory Visit (INDEPENDENT_AMBULATORY_CARE_PROVIDER_SITE_OTHER): Payer: BLUE CROSS/BLUE SHIELD | Admitting: Internal Medicine

## 2016-03-07 VITALS — BP 122/86 | HR 77 | Ht 66.0 in | Wt 221.1 lb

## 2016-03-07 DIAGNOSIS — R079 Chest pain, unspecified: Secondary | ICD-10-CM

## 2016-03-07 DIAGNOSIS — R0602 Shortness of breath: Secondary | ICD-10-CM

## 2016-03-07 MED ORDER — ASPIRIN EC 81 MG PO TBEC: 81.0000 mg | DELAYED_RELEASE_TABLET | Freq: Every day | ORAL | 3 refills | Status: DC

## 2016-03-07 NOTE — Patient Instructions (Signed)
Your physician has recommended you make the following change in your medication:  1.) start aspirin 81 mg --take one tablet once a day  Your physician has requested that you have an echocardiogram. Echocardiography is a painless test that uses sound waves to create images of your heart. It provides your doctor with information about the size and shape of your heart and how well your heart's chambers and valves are working. This procedure takes approximately one hour. There are no restrictions for this procedure.  Your physician has requested that you have en exercise stress myoview. For further information please visit [email or web address]. Please follow instruction sheet, as given.  Your physician recommends that you schedule a follow-up appointment in: 3 months with Dr. Saunders Revel.

## 2016-03-07 NOTE — Progress Notes (Signed)
New Outpatient Visit Date: 03/07/2016  Referring Provider: [name], DO [address] [place], [place] [address]  Chief Complaint:  Chief Complaint  Patient presents with  . Chest Pain    states she had chest pain on yesterday, feels like a tightness  . Shortness of Breath    pt states she is SOB     HPI:  Ms. Ching is a 50 y.o. year-old female with history of asthma, diabetes mellitus and hyperlipidemia, who has been referred by Dr. Carollee Herter for evaluation of atypical chest pain and shortness of breath. She reports that the symptoms have been intermittently present for several years. She was evaluated 2013 by Dr. Johnsie Cancel, with stress echo demonstrating no evidence of ischemia but a rate dependent intraventricular conduction delay.  Today, the patient reports a considerable amount of chest and upper back pain as well as accompanying shortness of breath. She notes that her chest discomfort has several qualities, including at times a heaviness, occasionally dull and sharp pain, and often shooting pain across her chest. The discomfort is typically in the center part of her chest but occasionally will radiate under the left breast. The dyspnea occasionally accompanies the chest pain but is also present alone at other times. Her chest pain typically lasts for 4-5 minutes and then begins to gradually improve on its own. The patient will sometimes take ibuprofen with some improvement as well. The back pain seems to be distinct and is located below the left shoulder blade. It is sometimes positional but also can occur without any activity. Overall, the symptoms have gotten more frequent over the last year. She denies lightheadedness, palpitations, and nausea. 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 She denies trauma to the chest wall. Her PCP referred the patient for  echocardiogram and myocardial perfusion stress test; however, the patient was told she needed cardiology evaluation for authorization of the studies.  --------------------------------------------------------------------------------------------------  Past Medical History:  Diagnosis Date  . Anemia    anemia  when pregnant.  10 yrs ago.   . Angina    pt states she has occasional chest pains no other sxs.. seen in ed  at  [facility] .. all tsts negative.   Marland Kitchen Anxiety   . Asthma 5 yrs ago    told she had asthma.. does not remember doctors name.   . Depression    takes citalopram  . Diabetes ([place]) 2016  . Headache(784.0)    years ago told h/a s were migraines.   . Hyperlipidemia   . Renal disorder    kidney stones  . Shortness of breath    with exertion  . Thyroid disease   . Tuberculosis    pt states she took 9 months of medicine for positive tb skin test.     Past Surgical History:  Procedure Laterality Date  . DILATION AND CURETTAGE OF UTERUS  1999  . ENDOMETRIAL ABLATION    . LAPAROSCOPY     years ago ..pt does not know where surgery was done ... states" my stomach would blow up then go down"  . THYROIDECTOMY  09/28/2011   Procedure: THYROIDECTOMY;  Surgeon: Izora Gala, MD;  Location: [facility];  Service: ENT;  Laterality: Right;  RIGHT THYROIDECTOMY WITH FROZEN SECTION  . TUBAL LIGATION      Outpatient Encounter Prescriptions as of 03/07/2016  Medication Sig  . acetaminophen (TYLENOL) 500 MG tablet Take 1,000 mg by mouth every 6 (six) hours as needed for mild pain.  . Biotin 5000 MCG CAPS Take 1 capsule by mouth daily.  . Cholecalciferol (VITAMIN D3) 2000 units TABS Take 1 tablet by mouth daily.  . citalopram (CELEXA) 20 MG tablet Take 1 tablet (20 mg total) by mouth daily.  . cyanocobalamin 500 MCG tablet Take 500 mcg by mouth daily.  . cyclobenzaprine (FLEXERIL) 10 MG tablet Take 1 tablet (10 mg total) by mouth 3 (three) times daily as needed for muscle spasms.  .  Flaxseed, Linseed, (FLAXSEED OIL PO) Take 1,300 mg by mouth daily.  . Ginkgo Biloba Extract 60 MG CAPS Take 60 mg by mouth daily.  Marland Kitchen glucose blood (ONETOUCH VERIO) test strip Onetouch Verio Check blood sugar twice daily  . losartan (COZAAR) 25 MG tablet Take 1 tablet (25 mg total) by mouth daily.  . Multiple Vitamin (MULTIVITAMIN WITH MINERALS) TABS tablet Take 1 tablet by mouth daily.  . Multiple Vitamin (MULTIVITAMIN) tablet Take 1 tablet by mouth daily.  . Omega-3 Fatty Acids (FISH OIL) 1200 MG CAPS Take 1,200 mg by mouth daily.  Glory Rosebush DELICA LANCETS FINE MISC Check blood sugar twice daily  . pantoprazole (PROTONIX) 40 MG tablet Take 1 tablet (40 mg total) by mouth daily.  . simvastatin (ZOCOR) 40 MG tablet Take 1 tablet (40 mg total) by mouth at bedtime.  . SitaGLIPtin-MetFORMIN HCl (JANUMET XR) [phone] MG TB24 1 po qd  . aspirin EC 81 MG tablet Take 1 tablet (81 mg total) by mouth daily.   No facility-administered encounter medications on file as of 03/07/2016.     Allergies: Lisinopril  Social History   Social History  . Marital status: Married    Spouse name: N/A  . Number of children: 3  . Years of education: N/A   Occupational History  . CNA    Social History Main Topics  . Smoking status: Never Smoker  . Smokeless tobacco: Never Used  . Alcohol use No  . Drug use: No  . Sexual activity: Yes    Partners: Male    Birth control/ protection: Surgical   Other Topics Concern  . Not on file   Social History Narrative   No exercise--  Walking and lifting pts at work    Family History  Problem Relation Age of Onset  . Diabetes Mother   . Hypertension Mother   . Hyperlipidemia Mother   . Gallbladder disease Mother   . Heart disease Mother   . Diabetes Sister   . Hypertension Father   . Hyperlipidemia Father   . Colon polyps Father   . Ovarian cancer Maternal Grandmother   . Gallbladder disease Maternal Grandmother   . Colon cancer Paternal Grandfather     . Breast cancer Maternal Aunt   . Heart disease Other     early cad  . Diabetes Brother   . Heart attack Brother   . Anesthesia problems Neg Hx   . Hypotension Neg Hx   . Malignant hyperthermia Neg Hx   . Pseudochol deficiency Neg Hx   . Esophageal cancer Neg Hx     Review of Systems: Review of Systems  Constitutional: Positive for diaphoresis.  HENT: Negative.   Respiratory: Positive for cough.        Snoring  Cardiovascular: Positive for chest pain.       Shooting leg pain.  Gastrointestinal: Positive for heartburn.  Trouble swallowing.  Genitourinary: Negative.   Musculoskeletal: Positive for back pain and myalgias.  Skin: Negative.   Neurological: Negative for dizziness and loss of consciousness.       Headache  Endo/Heme/Allergies: Negative.   Psychiatric/Behavioral: Negative.      --------------------------------------------------------------------------------------------------  Physical Exam: BP 122/86   Pulse 77   Ht 5\' 6"  (1.676 m)   Wt 221 lb 1.9 oz (100.3 kg)   BMI 35.69 kg/m   General:  Obese woman, seated comfortably in the exam room. HEENT: No conjunctival pallor or scleral icterus.  Moist mucous membranes.  OP clear. Neck: Supple without lymphadenopathy, thyromegaly, JVD, or HJR.  No carotid bruit. Lungs: Normal work of breathing.  Clear to auscultation bilaterally without wheezes or crackles. CV: Regular rate and rhythm without murmurs, rubs, or gallops.  Non-displaced PMI. Abd: Bowel sounds present.  Soft and nondistended. Mild right upper quadrant tenderness with radiation to the right back/shoulder. No rebound or guarding. Unable to assess hepatosplenomegaly due to body habitus. Ext: No lower extremity edema.  Radial, PT, and DP pulses are 2+ bilaterally Skin: warm and dry without rash Neuro: CNIII-XII intact.  Strength and fine-touch sensation intact in upper and lower extremities bilaterally. Psych: Normal mood and affect.  EKG:  Normal  sinus rhythm without significant abnormalities.  Lab Results  Component Value Date   WBC 6.6 08/15/2015   HGB 13.0 08/15/2015   HCT 39.6 08/15/2015   MCV 84.8 08/15/2015   PLT 297.0 08/15/2015    Lab Results  Component Value Date   NA 141 02/10/2016   K 3.8 02/10/2016   CL 107 02/10/2016   CO2 29 02/10/2016   BUN 9 02/10/2016   CREATININE 0.50 02/10/2016   GLUCOSE 117 (H) 02/10/2016   ALT 22 02/10/2016    Lab Results  Component Value Date   CHOL 187 02/10/2016   HDL 38.00 (L) 02/10/2016   LDLCALC 126 (H) 02/10/2016   TRIG 116.0 02/10/2016   CHOLHDL 5 02/10/2016    CXR (02/10/16): I have personally reviewed the images.  No acute cardiopulmonary disease.  --------------------------------------------------------------------------------------------------  ASSESSMENT AND PLAN: 1. Chest pain, unspecified chest pain type Pain is atypical and has been present intermittently for at least 4 years. Prior ischemia evaluation in 2013 was notable for a rate dependent left bundle branch block during exercise but no wall motion abnormalities on echocardiogram. Today's EKG is normal. Overall, have a low suspicion for significant coronary artery disease. However, the patient has multiple risk factors including hyperlipidemia, diabetes mellitus, and obesity. Given that her symptoms have become more frequent, it is reasonable to proceed with an exercise myocardial perfusion stress test. In light of her risk factors and potential for CAD, I have also asked the patient to start taking aspirin 81 mg daily. We will continue primary prevention with aggressive control of diabetes, as well as simvastatin. Given patient's right upper quadrant tenderness and dysphagia, a GI etiology such as gallbladder disease or GERD/esophageal spasm should also be considered. I will defer further workup of attentional GI causes to Dr. Cheri Rous. Muscular skeletal etiology is also a strong consideration given the patient's  frequent lifting and pushing at work. I have advised her to seek immediate medical attention if her pain or shortness of breath worsen significantly before further workup can be completed.  2. SOB (shortness of breath) This is likely multifactorial. I agree with a transthoracic echocardiogram to exclude heart failure or significant valvular pathology. Of note, the patient appeared euvolemic and well compensated  today. No murmurs are appreciated on exam.  Follow-up: Return to clinic in 3 months.  Nelva Bush, MD 03/07/2016 3:37 PM

## 2016-03-08 ENCOUNTER — Other Ambulatory Visit: Payer: Self-pay

## 2016-03-08 DIAGNOSIS — R0789 Other chest pain: Secondary | ICD-10-CM

## 2016-03-08 NOTE — Addendum Note (Signed)
Addended by: Zebedee Iba on: 03/08/2016 04:26 PM   Modules accepted: Orders

## 2016-03-20 ENCOUNTER — Telehealth (HOSPITAL_COMMUNITY): Payer: Self-pay | Admitting: *Deleted

## 2016-03-20 ENCOUNTER — Telehealth: Payer: Self-pay | Admitting: Internal Medicine

## 2016-03-20 NOTE — Telephone Encounter (Signed)
Left message on voicemail in reference to upcoming appointment scheduled for 03/21/16. Phone number given for a call back so details instructions can be given.   Felicia Solis

## 2016-03-20 NOTE — Telephone Encounter (Signed)
Reviewed myoview instruction sheet with patient. Patient verbalized understanding and agreeable to plan.

## 2016-03-20 NOTE — Telephone Encounter (Signed)
New message ° ° ° ° ° ° °Pt returning nurse call  °

## 2016-03-21 ENCOUNTER — Ambulatory Visit (HOSPITAL_BASED_OUTPATIENT_CLINIC_OR_DEPARTMENT_OTHER): Payer: BLUE CROSS/BLUE SHIELD

## 2016-03-21 ENCOUNTER — Other Ambulatory Visit: Payer: Self-pay

## 2016-03-21 ENCOUNTER — Ambulatory Visit (HOSPITAL_COMMUNITY): Payer: BLUE CROSS/BLUE SHIELD | Attending: Cardiovascular Disease

## 2016-03-21 DIAGNOSIS — R079 Chest pain, unspecified: Secondary | ICD-10-CM | POA: Diagnosis not present

## 2016-03-21 DIAGNOSIS — R0602 Shortness of breath: Secondary | ICD-10-CM

## 2016-03-21 DIAGNOSIS — I517 Cardiomegaly: Secondary | ICD-10-CM | POA: Insufficient documentation

## 2016-03-21 LAB — ECHOCARDIOGRAM COMPLETE
Height: 66 in
Weight: 3536 oz

## 2016-03-21 MED ORDER — TECHNETIUM TC 99M TETROFOSMIN IV KIT
32.7000 | PACK | Freq: Once | INTRAVENOUS | Status: AC | PRN
  Administered 2016-03-21: 32.7 via INTRAVENOUS
  Filled 2016-03-21: qty 33

## 2016-03-22 ENCOUNTER — Ambulatory Visit (HOSPITAL_COMMUNITY): Payer: BLUE CROSS/BLUE SHIELD | Attending: Cardiology

## 2016-03-22 LAB — MYOCARDIAL PERFUSION IMAGING
CHL CUP NUCLEAR SDS: 2
CHL CUP NUCLEAR SRS: 2
CHL CUP NUCLEAR SSS: 4
CSEPED: 4 min
CSEPEDS: 0 s
CSEPEW: 5.8 METS
CSEPHR: 105 %
CSEPPHR: 179 {beats}/min
LHR: 0.28
LV dias vol: 97 mL (ref 46–106)
LV sys vol: 38 mL
MPHR: 169 {beats}/min
RPE: 19
Rest HR: 74 {beats}/min
TID: 0.84

## 2016-03-22 MED ORDER — TECHNETIUM TC 99M TETROFOSMIN IV KIT
33.0000 | PACK | Freq: Once | INTRAVENOUS | Status: AC | PRN
  Administered 2016-03-22: 33 via INTRAVENOUS
  Filled 2016-03-22: qty 33

## 2016-04-20 ENCOUNTER — Other Ambulatory Visit: Payer: Self-pay | Admitting: Family Medicine

## 2016-04-20 DIAGNOSIS — Z1231 Encounter for screening mammogram for malignant neoplasm of breast: Secondary | ICD-10-CM

## 2016-05-14 ENCOUNTER — Ambulatory Visit
Admission: RE | Admit: 2016-05-14 | Discharge: 2016-05-14 | Disposition: A | Discharge status: Home / Self Care | Payer: BLUE CROSS/BLUE SHIELD | Source: Ambulatory Visit | Attending: Family Medicine | Admitting: Family Medicine

## 2016-05-14 DIAGNOSIS — Z1231 Encounter for screening mammogram for malignant neoplasm of breast: Secondary | ICD-10-CM

## 2016-05-15 ENCOUNTER — Ambulatory Visit: Payer: Self-pay | Admitting: Family Medicine

## 2016-05-28 ENCOUNTER — Ambulatory Visit (INDEPENDENT_AMBULATORY_CARE_PROVIDER_SITE_OTHER): Payer: BLUE CROSS/BLUE SHIELD | Admitting: Medical

## 2016-05-28 ENCOUNTER — Encounter: Payer: Self-pay | Admitting: Medical

## 2016-05-28 VITALS — BP 126/80 | HR 87 | Temp 98.1°F | Ht 66.0 in | Wt 226.0 lb

## 2016-05-28 DIAGNOSIS — J209 Acute bronchitis, unspecified: Secondary | ICD-10-CM

## 2016-05-28 DIAGNOSIS — J0100 Acute maxillary sinusitis, unspecified: Secondary | ICD-10-CM

## 2016-05-28 DIAGNOSIS — F411 Generalized anxiety disorder: Secondary | ICD-10-CM | POA: Diagnosis not present

## 2016-05-28 MED ORDER — FLUTICASONE PROPIONATE 50 MCG/ACT NA SUSP: 2.0000 | Freq: Every day | NASAL | 1 refills | Status: DC

## 2016-05-28 MED ORDER — CITALOPRAM HYDROBROMIDE 20 MG PO TABS: 20.0000 mg | ORAL_TABLET | Freq: Every day | ORAL | 11 refills | Status: DC

## 2016-05-28 MED ORDER — HYDROCODONE-HOMATROPINE 5-1.5 MG/5ML PO SYRP: 5.0000 mL | ORAL_SOLUTION | Freq: Three times a day (TID) | ORAL | 0 refills | Status: DC | PRN

## 2016-05-28 MED ORDER — DOXYCYCLINE HYCLATE 100 MG PO TABS: 100.0000 mg | ORAL_TABLET | Freq: Two times a day (BID) | ORAL | 0 refills | Status: DC

## 2016-05-28 NOTE — Progress Notes (Signed)
Subjective:    Patient ID: Felicia Solis, female    DOB: 1965-10-12, 50 y.o.   MRN: [id number]  HPI  Pt in feeling sick for more than a week. At first had a cough. Pt has some nasal congestion and some chest chest congestion. Pt at beginning had productive cough. But now feels like can't bring up mucous.    Pt recently had brief/transient sharp chest pain. At times some pain after eating. No pain presently.Pt had work up I sept 2017 by cardiologist and he thought she might need Gi evaluation(heart work up was negative).  Today on discussion ut on further discussion recent costochondritis type pain. Pain brief and transient on coughing.  Pt has no wheezing recently. No myalgias.  Review of Systems  Constitutional: Negative for chills, fatigue and fever.  HENT: Positive for congestion and sinus pressure. Negative for postnasal drip, sore throat and voice change.   Respiratory: Positive for cough. Negative for chest tightness, shortness of breath and wheezing.   Cardiovascular: Negative for chest pain and palpitations.  Gastrointestinal: Negative for abdominal pain.  Musculoskeletal: Negative for back pain.       Mild costcohondral junction tender.  Skin: Negative for rash.  Neurological: Negative for dizziness, tremors, weakness and light-headedness.  Hematological: Negative for adenopathy. Does not bruise/bleed easily.  Psychiatric/Behavioral: Negative for behavioral problems and suicidal ideas. The patient is not nervous/anxious.        Stable per pt.   Past Medical History:  Diagnosis Date  . Anemia    anemia  when pregnant.  10 yrs ago.   . Angina    pt states she has occasional chest pains no other sxs.. seen in ed  at  [facility] .. all tsts negative.   Marland Kitchen Anxiety   . Asthma 5 yrs ago    told she had asthma.. does not remember doctors name.   . Depression    takes citalopram  . Diabetes ([place]) 2016  . Headache(784.0)    years ago told h/a s were migraines.   .  Hyperlipidemia   . Renal disorder    kidney stones  . Shortness of breath    with exertion  . Thyroid disease   . Tuberculosis    pt states she took 9 months of medicine for positive tb skin test.      Social History   Social History  . Marital status: Married    Spouse name: N/A  . Number of children: 3  . Years of education: N/A   Occupational History  . CNA    Social History Main Topics  . Smoking status: Never Smoker  . Smokeless tobacco: Never Used  . Alcohol use No  . Drug use: No  . Sexual activity: Yes    Partners: Male    Birth control/ protection: Surgical   Other Topics Concern  . Not on file   Social History Narrative   No exercise--  Walking and lifting pts at work    Past Surgical History:  Procedure Laterality Date  . DILATION AND CURETTAGE OF UTERUS  1999  . ENDOMETRIAL ABLATION    . LAPAROSCOPY     years ago ..pt does not know where surgery was done ... states" my stomach would blow up then go down"  . THYROIDECTOMY  09/28/2011   Procedure: THYROIDECTOMY;  Surgeon: Izora Gala, MD;  Location: [facility];  Service: ENT;  Laterality: Right;  RIGHT THYROIDECTOMY WITH FROZEN SECTION  . TUBAL LIGATION  Family History  Problem Relation Age of Onset  . Diabetes Mother   . Hypertension Mother   . Hyperlipidemia Mother   . Gallbladder disease Mother   . Heart disease Mother   . Diabetes Sister   . Hypertension Father   . Hyperlipidemia Father   . Colon polyps Father   . Ovarian cancer Maternal Grandmother   . Gallbladder disease Maternal Grandmother   . Colon cancer Paternal Grandfather   . Breast cancer Maternal Aunt   . Heart disease Other     early cad  . Diabetes Brother   . Heart attack Brother   . Anesthesia problems Neg Hx   . Hypotension Neg Hx   . Malignant hyperthermia Neg Hx   . Pseudochol deficiency Neg Hx   . Esophageal cancer Neg Hx     Allergies  Allergen Reactions  . Lisinopril Cough    Current Outpatient  Prescriptions on File Prior to Visit  Medication Sig Dispense Refill  . acetaminophen (TYLENOL) 500 MG tablet Take 1,000 mg by mouth every 6 (six) hours as needed for mild pain.    Marland Kitchen aspirin EC 81 MG tablet Take 1 tablet (81 mg total) by mouth daily. 90 tablet 3  . Biotin 5000 MCG CAPS Take 1 capsule by mouth daily.    . Cholecalciferol (VITAMIN D3) 2000 units TABS Take 1 tablet by mouth daily.    . citalopram (CELEXA) 20 MG tablet Take 1 tablet (20 mg total) by mouth daily. 30 tablet 11  . cyanocobalamin 500 MCG tablet Take 500 mcg by mouth daily.    . cyclobenzaprine (FLEXERIL) 10 MG tablet Take 1 tablet (10 mg total) by mouth 3 (three) times daily as needed for muscle spasms. 30 tablet 0  . Flaxseed, Linseed, (FLAXSEED OIL PO) Take 1,300 mg by mouth daily.    . Ginkgo Biloba Extract 60 MG CAPS Take 60 mg by mouth daily.    Marland Kitchen glucose blood (ONETOUCH VERIO) test strip Onetouch Verio Check blood sugar twice daily 100 each 12  . losartan (COZAAR) 25 MG tablet Take 1 tablet (25 mg total) by mouth daily. 30 tablet 5  . Multiple Vitamin (MULTIVITAMIN WITH MINERALS) TABS tablet Take 1 tablet by mouth daily.    . Multiple Vitamin (MULTIVITAMIN) tablet Take 1 tablet by mouth daily.    . Omega-3 Fatty Acids (FISH OIL) 1200 MG CAPS Take 1,200 mg by mouth daily.    Glory Rosebush DELICA LANCETS FINE MISC Check blood sugar twice daily 100 each 12  . pantoprazole (PROTONIX) 40 MG tablet Take 1 tablet (40 mg total) by mouth daily. 30 tablet 3  . simvastatin (ZOCOR) 40 MG tablet Take 1 tablet (40 mg total) by mouth at bedtime. 30 tablet 2  . SitaGLIPtin-MetFORMIN HCl (JANUMET XR) [phone] MG TB24 1 po qd 60 tablet 2   No current facility-administered medications on file prior to visit.     BP 126/80 (BP Location: Left Arm, Patient Position: Sitting, Cuff Size: Normal)   Pulse 87   Temp 98.1 F (36.7 C) (Oral)   Ht 5\' 6"  (1.676 m)   Wt 226 lb (102.5 kg)   SpO2 97%   BMI 36.48 kg/m       Objective:     Physical Exam   General  Mental Status - Alert. General Appearance - Well groomed. Not in acute distress.  Skin Rashes- No Rashes.  HEENT Head- Normal. Ear Auditory Canal - Left- Normal. Right - Normal.Tympanic Membrane- Left- Normal. Right-  Normal. Eye Sclera/Conjunctiva- Left- Normal. Right- Normal. Nose & Sinuses Nasal Mucosa- Left-  Boggy and Congested. Right-  Boggy and  Congested.Bilateral  Faint maxillary and faint frontal sinus pressure. Mouth & Throat Lips: Upper Lip- Normal: no dryness, cracking, pallor, cyanosis, or vesicular eruption. Lower Lip-Normal: no dryness, cracking, pallor, cyanosis or vesicular eruption. Buccal Mucosa- Bilateral- No Aphthous ulcers. Oropharynx- No Discharge or Erythema. Tonsils: Characteristics- Bilateral- No Erythema or Congestion. Size/Enlargement- Bilateral- No enlargement. Discharge- bilateral-None.  Neck Neck- Supple. No Masses.   Chest and Lung Exam Auscultation: Breath Sounds:-Clear even and unlabored.  Cardiovascular Auscultation:Rythm- Regular, rate and rhythm. Murmurs & Other Heart Sounds:Ausculatation of the heart reveal- No Murmurs.  Lymphatic Head & Neck General Head & Neck Lymphatics: Bilateral: Description- No Localized lymphadenopathy.  Anterior thorax- mild left costochandral junction tenderness to palpation. Back- left side back pain on palpation. Para thoracic area on       Assessment & Plan:  You appear to have bronchitis and sinusitis. Rest hydrate and tylenol for fever. I am prescribing cough medicine hycodan, and doxycycline antibiotic. For your nasal congestion rx flonase.  For costochondritis type pain can use low dose ibuprofen.(But if constant chest pain then ED evaluation)  You should gradually get better. If not then notify us and would recommend a chest xray.  Pt states mood is good and she wants refill of her celexa.So I did refill med for her.  Follow up in 7-10 days or as needed

## 2016-05-28 NOTE — Patient Instructions (Addendum)
You appear to have bronchitis and sinusitis. Rest hydrate and tylenol for fever. I am prescribing cough medicine hydocan, and doxycycline  antibiotic. For your nasal congestion rx flonase.  For costochondritis type pain can use low dose ibuprofen.(But if constant chest pain then ED evaluation)  You should gradually get better. If not then notify us and would recommend a chest xray.  Follow up in 7-10 days or as needed

## 2016-05-31 DIAGNOSIS — Z111 Encounter for screening for respiratory tuberculosis: Secondary | ICD-10-CM | POA: Diagnosis not present

## 2016-06-07 ENCOUNTER — Ambulatory Visit: Payer: Self-pay | Admitting: Internal Medicine

## 2016-06-07 NOTE — Progress Notes (Deleted)
Follow-up Outpatient Visit Date: 06/07/2016  Chief Complaint: Follow-up chest pain and shortness of breath  HPI:  Felicia Solis is a 50 y.o. year-old female with history of asthma, diabetes mellitus, and hyperlipidemia, who presents for follow-up of atypical chest pain and shortness of breath.  --------------------------------------------------------------------------------------------------  Cardiovascular History & Procedures: Cardiovascular Problems:  ***  Risk Factors:  ***  Cath/PCI:  ***  CV Surgery:  ***  EP Procedures and Devices:  ***  Non-Invasive Evaluation(s):  ***  Recent CV Pertinent Labs: Lab Results  Component Value Date   CHOL 187 02/10/2016   HDL 38.00 (L) 02/10/2016   LDLCALC 126 (H) 02/10/2016   TRIG 116.0 02/10/2016   CHOLHDL 5 02/10/2016   K 3.8 02/10/2016   BUN 9 02/10/2016   CREATININE 0.50 02/10/2016   CREATININE 0.54 08/06/2014     Past medical and surgical history were reviewed and updated in EPIC.   Outpatient Encounter Prescriptions as of 06/07/2016  Medication Sig  . acetaminophen (TYLENOL) 500 MG tablet Take 1,000 mg by mouth every 6 (six) hours as needed for mild pain.  Marland Kitchen aspirin EC 81 MG tablet Take 1 tablet (81 mg total) by mouth daily.  . Biotin 5000 MCG CAPS Take 1 capsule by mouth daily.  . Cholecalciferol (VITAMIN D3) 2000 units TABS Take 1 tablet by mouth daily.  . citalopram (CELEXA) 20 MG tablet Take 1 tablet (20 mg total) by mouth daily.  . cyanocobalamin 500 MCG tablet Take 500 mcg by mouth daily.  . cyclobenzaprine (FLEXERIL) 10 MG tablet Take 1 tablet (10 mg total) by mouth 3 (three) times daily as needed for muscle spasms.  Marland Kitchen doxycycline (VIBRA-TABS) 100 MG tablet Take 1 tablet (100 mg total) by mouth 2 (two) times daily.  . Flaxseed, Linseed, (FLAXSEED OIL PO) Take 1,300 mg by mouth daily.  . fluticasone (FLONASE) 50 MCG/ACT nasal spray Place 2 sprays into both nostrils daily.  . Ginkgo Biloba Extract 60 MG  CAPS Take 60 mg by mouth daily.  Marland Kitchen glucose blood (ONETOUCH VERIO) test strip Onetouch Verio Check blood sugar twice daily  . HYDROcodone-homatropine (HYCODAN) 5-1.5 MG/5ML syrup Take 5 mLs by mouth every 8 (eight) hours as needed for cough.  . losartan (COZAAR) 25 MG tablet Take 1 tablet (25 mg total) by mouth daily.  . Multiple Vitamin (MULTIVITAMIN WITH MINERALS) TABS tablet Take 1 tablet by mouth daily.  . Multiple Vitamin (MULTIVITAMIN) tablet Take 1 tablet by mouth daily.  . Omega-3 Fatty Acids (FISH OIL) 1200 MG CAPS Take 1,200 mg by mouth daily.  Glory Rosebush DELICA LANCETS FINE MISC Check blood sugar twice daily  . pantoprazole (PROTONIX) 40 MG tablet Take 1 tablet (40 mg total) by mouth daily.  . simvastatin (ZOCOR) 40 MG tablet Take 1 tablet (40 mg total) by mouth at bedtime.  . SitaGLIPtin-MetFORMIN HCl (JANUMET XR) [phone] MG TB24 1 po qd   No facility-administered encounter medications on file as of 06/07/2016.     Allergies: Lisinopril  Social History   Social History  . Marital status: Married    Spouse name: N/A  . Number of children: 3  . Years of education: N/A   Occupational History  . CNA    Social History Main Topics  . Smoking status: Never Smoker  . Smokeless tobacco: Never Used  . Alcohol use No  . Drug use: No  . Sexual activity: Yes    Partners: Male    Birth control/ protection: Surgical   Other Topics  Concern  . Not on file   Social History Narrative   No exercise--  Walking and lifting pts at work    Family History  Problem Relation Age of Onset  . Diabetes Mother   . Hypertension Mother   . Hyperlipidemia Mother   . Gallbladder disease Mother   . Heart disease Mother   . Diabetes Sister   . Hypertension Father   . Hyperlipidemia Father   . Colon polyps Father   . Ovarian cancer Maternal Grandmother   . Gallbladder disease Maternal Grandmother   . Colon cancer Paternal Grandfather   . Breast cancer Maternal Aunt   . Heart disease  Other     early cad  . Diabetes Brother   . Heart attack Brother   . Anesthesia problems Neg Hx   . Hypotension Neg Hx   . Malignant hyperthermia Neg Hx   . Pseudochol deficiency Neg Hx   . Esophageal cancer Neg Hx     Review of Systems: A 12-system review of systems was performed and was negative except as noted in the HPI.  --------------------------------------------------------------------------------------------------  Physical Exam: There were no vitals taken for this visit.  General:  *** HEENT: No conjunctival pallor or scleral icterus.  Moist mucous membranes.  OP clear. Neck: Supple without lymphadenopathy, thyromegaly, JVD, or HJR.  No carotid bruit. Lungs: Normal work of breathing.  Clear to auscultation bilaterally without wheezes or crackles. Heart: Regular rate and rhythm without murmurs, rubs, or gallops.  Non-displaced PMI. Abd: Bowel sounds present.  Soft, NT/ND without hepatosplenomegaly Ext: No lower extremity edema.  Radial, PT, and DP pulses are 2+ bilaterally. Skin: warm and dry without rash  EKG:  ***  Lab Results  Component Value Date   WBC 6.6 08/15/2015   HGB 13.0 08/15/2015   HCT 39.6 08/15/2015   MCV 84.8 08/15/2015   PLT 297.0 08/15/2015    Lab Results  Component Value Date   NA 141 02/10/2016   K 3.8 02/10/2016   CL 107 02/10/2016   CO2 29 02/10/2016   BUN 9 02/10/2016   CREATININE 0.50 02/10/2016   GLUCOSE 117 (H) 02/10/2016   ALT 22 02/10/2016    Lab Results  Component Value Date   CHOL 187 02/10/2016   HDL 38.00 (L) 02/10/2016   LDLCALC 126 (H) 02/10/2016   TRIG 116.0 02/10/2016   CHOLHDL 5 02/10/2016    --------------------------------------------------------------------------------------------------  ASSESSMENT AND PLAN: Harrell Gave Xenia Nile, MD 06/07/2016 7:54 AM

## 2016-06-19 ENCOUNTER — Encounter: Payer: Self-pay | Admitting: Internal Medicine

## 2016-07-17 ENCOUNTER — Ambulatory Visit: Payer: Self-pay | Admitting: Family Medicine

## 2016-07-18 ENCOUNTER — Ambulatory Visit (INDEPENDENT_AMBULATORY_CARE_PROVIDER_SITE_OTHER): Payer: BLUE CROSS/BLUE SHIELD | Admitting: Medical

## 2016-07-18 ENCOUNTER — Telehealth: Payer: Self-pay | Admitting: *Deleted

## 2016-07-18 ENCOUNTER — Encounter: Payer: Self-pay | Admitting: Medical

## 2016-07-18 VITALS — BP 113/69 | HR 64 | Temp 98.1°F | Resp 16 | Ht 66.0 in | Wt 222.1 lb

## 2016-07-18 DIAGNOSIS — K219 Gastro-esophageal reflux disease without esophagitis: Secondary | ICD-10-CM

## 2016-07-18 DIAGNOSIS — M546 Pain in thoracic spine: Secondary | ICD-10-CM | POA: Diagnosis not present

## 2016-07-18 DIAGNOSIS — M545 Low back pain: Secondary | ICD-10-CM

## 2016-07-18 DIAGNOSIS — I10 Essential (primary) hypertension: Secondary | ICD-10-CM

## 2016-07-18 DIAGNOSIS — M62830 Muscle spasm of back: Secondary | ICD-10-CM

## 2016-07-18 MED ORDER — PANTOPRAZOLE SODIUM 40 MG PO TBEC: 40.0000 mg | DELAYED_RELEASE_TABLET | Freq: Every day | ORAL | 3 refills | Status: DC

## 2016-07-18 MED ORDER — CYCLOBENZAPRINE HCL 5 MG PO TABS: 5.0000 mg | ORAL_TABLET | Freq: Every day | ORAL | 0 refills | Status: DC

## 2016-07-18 MED ORDER — KETOROLAC TROMETHAMINE 60 MG/2ML IM SOLN
60.0000 mg | Freq: Once | INTRAMUSCULAR | Status: AC
  Administered 2016-07-18: 60 mg via INTRAMUSCULAR

## 2016-07-18 MED ORDER — LOSARTAN POTASSIUM 25 MG PO TABS: 25.0000 mg | ORAL_TABLET | Freq: Every day | ORAL | 5 refills | Status: DC

## 2016-07-18 MED ORDER — DICLOFENAC SODIUM 75 MG PO TBEC: 75.0000 mg | DELAYED_RELEASE_TABLET | Freq: Two times a day (BID) | ORAL | 0 refills | Status: DC

## 2016-07-18 NOTE — Patient Instructions (Addendum)
For back pain we gave toradol 60 mg im.  Starting tomorrow diclofenac for pain and inflammation.  Rx flexeril to use at night.  If pain persists by Friday then get xrays.(standing orders in computer)  Work note/excuse for one day.  Follow up in 7-10 days or as needed. Red flag signs or symptoms discussed that would indicate ED evaluation.  Costochondritis likely as well. Neg stress test in past. Features of cartlige pain. But if constant pain or cardiac like then ED evaluation.

## 2016-07-18 NOTE — Telephone Encounter (Signed)
Patient was seen in office today for back pain; while going over her medication list, patient reported that she could not afford the Janumet Rx and has just been continuing with left-over Metformin XR 500mg  [2] in the Am, as previously px. Also, she had not begun taking the OTC Vitamin D3 and could not remember why she was instructed to start taking supplement [did not find a lab drawn for levels] and patient reports that has not taken the weekly Vit D2 therapy prior. Please Advise patient on these inquiries, thanks/SLS 01/24

## 2016-07-18 NOTE — Progress Notes (Signed)
Pre visit review using our clinic review tool, if applicable. No additional management support is needed unless otherwise documented below in the visit note/SLS  

## 2016-07-18 NOTE — Progress Notes (Signed)
Subjective:    Patient ID: Felicia Solis, female    DOB: 01-22-1966, 51 y.o.   MRN: [id number]  HPI  Ptin with som pain in her upper and lower back low level pain on and off for one year. But recently had work injury. Pt states client was about to fall out of chair and she had to stop him from falling. Later during the shift felt some increase of pain in her back. Also some mid chest/costochandral area pain on deep breathing. Pain brief and transient last seconds. No associated signs or symptoms. This transient chest pain is minimal. It hurts today on palpation and deep inspiration. Otherwise no pain.  Pt states/desribes of atypical pain in chest in past. Low risk stress test nuclear. Hx of lt bundle brand.   Pt back pain is about 5/10.    Review of Systems  Constitutional: Negative for chills, fatigue and fever.  Respiratory: Negative for choking, chest tightness and shortness of breath.   Cardiovascular: Negative for chest pain and palpitations.  Gastrointestinal: Negative for abdominal pain.  Musculoskeletal: Positive for back pain. Negative for neck pain.       Costochondral junction tenderness.  Skin: Negative for rash.  Hematological: Negative for adenopathy. Does not bruise/bleed easily.  Psychiatric/Behavioral: Negative for behavioral problems, confusion and decreased concentration.    Past Medical History:  Diagnosis Date  . Anemia    anemia  when pregnant.  10 yrs ago.   . Angina    pt states she has occasional chest pains no other sxs.. seen in ed  at  [facility] .. all tsts negative.   Marland Kitchen Anxiety   . Asthma 5 yrs ago    told she had asthma.. does not remember doctors name.   . Depression    takes citalopram  . Diabetes ([place]) 2016  . Headache(784.0)    years ago told h/a s were migraines.   . Hyperlipidemia   . Renal disorder    kidney stones  . Shortness of breath    with exertion  . Thyroid disease   . Tuberculosis    pt states she took 9 months of  medicine for positive tb skin test.      Social History   Social History  . Marital status: Married    Spouse name: N/A  . Number of children: 3  . Years of education: N/A   Occupational History  . CNA    Social History Main Topics  . Smoking status: Never Smoker  . Smokeless tobacco: Never Used  . Alcohol use No  . Drug use: No  . Sexual activity: Yes    Partners: Male    Birth control/ protection: Surgical   Other Topics Concern  . Not on file   Social History Narrative   No exercise--  Walking and lifting pts at work    Past Surgical History:  Procedure Laterality Date  . DILATION AND CURETTAGE OF UTERUS  1999  . ENDOMETRIAL ABLATION    . LAPAROSCOPY     years ago ..pt does not know where surgery was done ... states" my stomach would blow up then go down"  . THYROIDECTOMY  09/28/2011   Procedure: THYROIDECTOMY;  Surgeon: Izora Gala, MD;  Location: [facility];  Service: ENT;  Laterality: Right;  RIGHT THYROIDECTOMY WITH FROZEN SECTION  . TUBAL LIGATION      Family History  Problem Relation Age of Onset  . Diabetes Mother   . Hypertension Mother   .  Hyperlipidemia Mother   . Gallbladder disease Mother   . Heart disease Mother   . Diabetes Sister   . Hypertension Father   . Hyperlipidemia Father   . Colon polyps Father   . Ovarian cancer Maternal Grandmother   . Gallbladder disease Maternal Grandmother   . Colon cancer Paternal Grandfather   . Breast cancer Maternal Aunt   . Heart disease Other     early cad  . Diabetes Brother   . Heart attack Brother   . Anesthesia problems Neg Hx   . Hypotension Neg Hx   . Malignant hyperthermia Neg Hx   . Pseudochol deficiency Neg Hx   . Esophageal cancer Neg Hx     Allergies  Allergen Reactions  . Lisinopril Cough    Current Outpatient Prescriptions on File Prior to Visit  Medication Sig Dispense Refill  . acetaminophen (TYLENOL) 500 MG tablet Take 1,000 mg by mouth every 6 (six) hours as needed for mild pain.     Marland Kitchen aspirin EC 81 MG tablet Take 1 tablet (81 mg total) by mouth daily. 90 tablet 3  . Biotin 5000 MCG CAPS Take 1 capsule by mouth daily.    . Cholecalciferol (VITAMIN D3) 2000 units TABS Take 1 tablet by mouth daily.    . citalopram (CELEXA) 20 MG tablet Take 1 tablet (20 mg total) by mouth daily. 30 tablet 11  . cyanocobalamin 500 MCG tablet Take 500 mcg by mouth daily.    . cyclobenzaprine (FLEXERIL) 10 MG tablet Take 1 tablet (10 mg total) by mouth 3 (three) times daily as needed for muscle spasms. 30 tablet 0  . Flaxseed, Linseed, (FLAXSEED OIL PO) Take 1,300 mg by mouth daily.    . fluticasone (FLONASE) 50 MCG/ACT nasal spray Place 2 sprays into both nostrils daily. 16 g 1  . Ginkgo Biloba Extract 60 MG CAPS Take 60 mg by mouth daily.    Marland Kitchen glucose blood (ONETOUCH VERIO) test strip Onetouch Verio Check blood sugar twice daily 100 each 12  . HYDROcodone-homatropine (HYCODAN) 5-1.5 MG/5ML syrup Take 5 mLs by mouth every 8 (eight) hours as needed for cough. 120 mL 0  . Multiple Vitamin (MULTIVITAMIN) tablet Take 1 tablet by mouth daily.    . Omega-3 Fatty Acids (FISH OIL) 1200 MG CAPS Take 1,200 mg by mouth daily.    Glory Rosebush DELICA LANCETS FINE MISC Check blood sugar twice daily 100 each 12  . simvastatin (ZOCOR) 40 MG tablet Take 1 tablet (40 mg total) by mouth at bedtime. 30 tablet 2  . SitaGLIPtin-MetFORMIN HCl (JANUMET XR) [phone] MG TB24 1 po qd (Patient not taking: Reported on 07/18/2016) 60 tablet 2   No current facility-administered medications on file prior to visit.     BP 113/69 (BP Location: Left Arm, Patient Position: Sitting, Cuff Size: Large)   Pulse 64   Temp 98.1 F (36.7 C) (Oral)   Resp 16   Ht 5\' 6"  (1.676 m)   Wt 222 lb 2 oz (100.8 kg)   SpO2 100%   BMI 35.85 kg/m       Objective:   Physical Exam  General Appearance- Not in acute distress.    Chest and Lung Exam Auscultation: Breath sounds:-Normal. Clear even and unlabored. Adventitious sounds:-  No Adventitious sounds.  Cardiovascular Auscultation:Rythm - Regular, rate and rythm. Heart Sounds -Normal heart sounds.  Abdomen Inspection:-Inspection Normal.  Palpation/Perucssion: Palpation and Percussion of the abdomen reveal- Non Tender, No Rebound tenderness, No rigidity(Guarding) and No Palpable  abdominal masses.  Liver:-Normal.  Spleen:- Normal.   Back Mid lumbar spine tenderness to palpation. Pain on straight leg lift. Pain on lateral movements and flexion/extension of the spine.  Lower ext neurologic  Mild- mid lumbar tenderness to palpation. L5-S1 sensation intact bilaterally. Normal patellar reflexes bilaterally. No foot drop bilaterally.  T-spine- faint lower t-spine pain on palpation.  Anterior thorax- mild repoducible costochandral junction tenderness.      Assessment & Plan:  For back pain we gave toradol 60 mg im.  Starting tomorrow diclofenac for pain and inflammation.  Rx flexeril to use at night.  If pain persists by Friday then get xrays.  Work note/excuse for one day.(but she will reutrn to work on Saturday since has Friday off from work already.)  Follow up in 7-10 days or as needed. Red flag signs or symptoms discussed that would indicate ED evaluation.  Costochondritis likely as well. Neg stress test in past. Features of cartlige pain. But if constant pain or cardiac like then ED evaluation.  Note also I did encourage pt to report injury at work since appears pain flared after work injury.  Kittie Krizan, Percell Miller, PA-C

## 2016-07-19 NOTE — Telephone Encounter (Signed)
We need pt formulary

## 2016-07-20 ENCOUNTER — Ambulatory Visit (HOSPITAL_BASED_OUTPATIENT_CLINIC_OR_DEPARTMENT_OTHER)
Admission: RE | Admit: 2016-07-20 | Discharge: 2016-07-20 | Disposition: A | Discharge status: Home / Self Care | Payer: BLUE CROSS/BLUE SHIELD | Source: Ambulatory Visit | Attending: Medical | Admitting: Medical

## 2016-07-20 ENCOUNTER — Telehealth: Payer: Self-pay | Admitting: Family Medicine

## 2016-07-20 DIAGNOSIS — M545 Low back pain: Secondary | ICD-10-CM | POA: Diagnosis not present

## 2016-07-20 DIAGNOSIS — M546 Pain in thoracic spine: Secondary | ICD-10-CM

## 2016-07-20 DIAGNOSIS — R221 Localized swelling, mass and lump, neck: Secondary | ICD-10-CM

## 2016-07-20 DIAGNOSIS — M47816 Spondylosis without myelopathy or radiculopathy, lumbar region: Secondary | ICD-10-CM | POA: Diagnosis not present

## 2016-07-20 DIAGNOSIS — M47893 Other spondylosis, cervicothoracic region: Secondary | ICD-10-CM | POA: Diagnosis not present

## 2016-07-20 DIAGNOSIS — E041 Nontoxic single thyroid nodule: Secondary | ICD-10-CM

## 2016-07-20 DIAGNOSIS — M47897 Other spondylosis, lumbosacral region: Secondary | ICD-10-CM | POA: Insufficient documentation

## 2016-07-20 NOTE — Telephone Encounter (Signed)
°  Relation to [id number] Call back number:[phone] Pharmacy:  Reason for call: pt was seen on Wednesday, and states Percell Miller informed her to go get xrays today if she was still having back pain. Pt is still in pain and had xrays done today, edward had written her out of work until today and she is suppose to go back tomorrow however she is still in pain and would like to know if he wants her to stay out until he gets the results back

## 2016-07-20 NOTE — Telephone Encounter (Signed)
Called pt and no answer on Friday late 6:30. Left message that return to work would depend on her level of pain. Explained if she feels can't work over weekend then would write her a note on Monday if she needed one. Would you call her and see how she is on monday?

## 2016-07-20 NOTE — Telephone Encounter (Signed)
Error/gd °

## 2016-07-21 NOTE — Telephone Encounter (Signed)
Will you coordinate with radiology the scheduling of her neck ultrasound and notify pt.(provided she did not do study with specialist)

## 2016-07-23 ENCOUNTER — Telehealth: Payer: Self-pay | Admitting: Family Medicine

## 2016-07-23 ENCOUNTER — Ambulatory Visit (HOSPITAL_BASED_OUTPATIENT_CLINIC_OR_DEPARTMENT_OTHER)
Admission: RE | Admit: 2016-07-23 | Discharge: 2016-07-23 | Disposition: A | Discharge status: Home / Self Care | Payer: BLUE CROSS/BLUE SHIELD | Source: Ambulatory Visit | Attending: Medical | Admitting: Medical

## 2016-07-23 DIAGNOSIS — E042 Nontoxic multinodular goiter: Secondary | ICD-10-CM | POA: Insufficient documentation

## 2016-07-23 DIAGNOSIS — R221 Localized swelling, mass and lump, neck: Secondary | ICD-10-CM | POA: Diagnosis not present

## 2016-07-23 NOTE — Telephone Encounter (Signed)
Called and left a message for call back  

## 2016-07-23 NOTE — Telephone Encounter (Signed)
Caller name: Relationship to patient:Self Can be reached: [phone]  Pharmacy:  Reason for call: Patient request call back to discuss imaging that was ordered by Mackie Pai. Also wants to know if he is going to write a work note for her.

## 2016-07-24 ENCOUNTER — Inpatient Hospital Stay (HOSPITAL_BASED_OUTPATIENT_CLINIC_OR_DEPARTMENT_OTHER): Admission: RE | Admit: 2016-07-24 | Payer: Self-pay | Source: Ambulatory Visit

## 2016-07-24 NOTE — Telephone Encounter (Signed)
Pt returned call.  Results discussed. Pt states back pain is not better and she has some questions regarding the US guided biopsy.  Pt agreed to come in for a follow up visit.  Appt scheduled tomorrow with Percell Miller.

## 2016-07-24 NOTE — Telephone Encounter (Signed)
Felicia Solis 14 hours ago (5:09 PM)      Caller name: Relationship to patient:Self Can be reached: [phone]  Pharmacy:  Reason for call: Patient request call back to discuss imaging that was ordered by Mackie Pai. Also wants to know if he is going to write a work note for her.

## 2016-07-24 NOTE — Telephone Encounter (Signed)
Forwarded to patient's PCP [Dr. Lowne] as she would be the provider to manage her patient's Chronic Medical issues and Medications/SLS

## 2016-07-24 NOTE — Telephone Encounter (Signed)
Pt called in returning call for lab results. Pt says that she is at work, if possible please call her back after 3

## 2016-07-24 NOTE — Telephone Encounter (Addendum)
I can write her a note but also want her to follow up. If pain not improving can consider sports med and PT referral.

## 2016-07-24 NOTE — Telephone Encounter (Signed)
Felicia Solis 1 hour ago (1:46 PM)      Pt called in returning call for lab results. Pt says that she is at work, if possible please call her back after 3          Documentation

## 2016-07-24 NOTE — Telephone Encounter (Signed)
I put in referral to Dr. Constance Holster ENT.

## 2016-07-24 NOTE — Telephone Encounter (Signed)
Called and left a message for call back  

## 2016-07-24 NOTE — Telephone Encounter (Signed)
Copy & Pasted in to Open phone note RE: this matter.01/30

## 2016-07-24 NOTE — Telephone Encounter (Signed)
Again, Copy & Pasted note in to Open phone note RE: this matter.01/30

## 2016-07-24 NOTE — Telephone Encounter (Signed)
I called pt today at 8:30 am . No answer. Her phone beeped sounds like to leave message. Left brief message about returning her call. Wanted to see how she was and review studies. Left message asking her to call back before 12 noon today since this is my half day.

## 2016-07-25 ENCOUNTER — Ambulatory Visit (INDEPENDENT_AMBULATORY_CARE_PROVIDER_SITE_OTHER): Payer: BLUE CROSS/BLUE SHIELD | Admitting: Medical

## 2016-07-25 VITALS — BP 113/78 | HR 76 | Temp 98.2°F | Ht 66.0 in | Wt 224.2 lb

## 2016-07-25 DIAGNOSIS — M545 Low back pain: Secondary | ICD-10-CM

## 2016-07-25 DIAGNOSIS — M62830 Muscle spasm of back: Secondary | ICD-10-CM | POA: Diagnosis not present

## 2016-07-25 DIAGNOSIS — M546 Pain in thoracic spine: Secondary | ICD-10-CM | POA: Diagnosis not present

## 2016-07-25 MED ORDER — HYDROCODONE-ACETAMINOPHEN 5-325 MG PO TABS: 1.0000 | ORAL_TABLET | Freq: Four times a day (QID) | ORAL | 0 refills | Status: DC | PRN

## 2016-07-25 MED ORDER — DICLOFENAC SODIUM 75 MG PO TBEC
75.0000 mg | DELAYED_RELEASE_TABLET | Freq: Two times a day (BID) | ORAL | 0 refills | Status: DC
Start: 2016-07-25 — End: 2016-08-21

## 2016-07-25 MED ORDER — CYCLOBENZAPRINE HCL 10 MG PO TABS: 10.0000 mg | ORAL_TABLET | Freq: Every day | ORAL | 0 refills | Status: DC

## 2016-07-25 NOTE — Progress Notes (Signed)
Pre visit review using our clinic tool,if applicable. No additional management support is needed unless otherwise documented below in the visit note.  

## 2016-07-25 NOTE — Patient Instructions (Addendum)
For your back pain will refer to sports medicine since pain is persisting now about 3 weeks.  Will rx diclofenac refill , flexeril refill and add norco to your treatment regimen.  Would recommend cutting back on your hours or do light duty. If you get light duty sheet I can fill out.   Follow up in 2 weeks or as needed    Back Exercises Introduction If you have pain in your back, do these exercises 2-3 times each day or as told by your doctor. When the pain goes away, do the exercises once each day, but repeat the steps more times for each exercise (do more repetitions). If you do not have pain in your back, do these exercises once each day or as told by your doctor. Exercises Single Knee to Chest  Do these steps 3-5 times in a row for each leg: 1. Lie on your back on a firm bed or the floor with your legs stretched out. 2. Bring one knee to your chest. 3. Hold your knee to your chest by grabbing your knee or thigh. 4. Pull on your knee until you feel a gentle stretch in your lower back. 5. Keep doing the stretch for 10-30 seconds. 6. Slowly let go of your leg and straighten it. Pelvic Tilt  Do these steps 5-10 times in a row: 1. Lie on your back on a firm bed or the floor with your legs stretched out. 2. Bend your knees so they point up to the ceiling. Your feet should be flat on the floor. 3. Tighten your lower belly (abdomen) muscles to press your lower back against the floor. This will make your tailbone point up to the ceiling instead of pointing down to your feet or the floor. 4. Stay in this position for 5-10 seconds while you gently tighten your muscles and breathe evenly. Cat-Cow  Do these steps until your lower back bends more easily: 1. Get on your hands and knees on a firm surface. Keep your hands under your shoulders, and keep your knees under your hips. You may put padding under your knees. 2. Let your head hang down, and make your tailbone point down to the floor so your  lower back is round like the back of a cat. 3. Stay in this position for 5 seconds. 4. Slowly lift your head and make your tailbone point up to the ceiling so your back hangs low (sags) like the back of a cow. 5. Stay in this position for 5 seconds. Press-Ups  Do these steps 5-10 times in a row: 1. Lie on your belly (face-down) on the floor. 2. Place your hands near your head, about shoulder-width apart. 3. While you keep your back relaxed and keep your hips on the floor, slowly straighten your arms to raise the top half of your body and lift your shoulders. Do not use your back muscles. To make yourself more comfortable, you may change where you place your hands. 4. Stay in this position for 5 seconds. 5. Slowly return to lying flat on the floor. Bridges  Do these steps 10 times in a row: 1. Lie on your back on a firm surface. 2. Bend your knees so they point up to the ceiling. Your feet should be flat on the floor. 3. Tighten your butt muscles and lift your butt off of the floor until your waist is almost as high as your knees. If you do not feel the muscles working in your butt  and the back of your thighs, slide your feet 1-2 inches farther away from your butt. 4. Stay in this position for 3-5 seconds. 5. Slowly lower your butt to the floor, and let your butt muscles relax. If this exercise is too easy, try doing it with your arms crossed over your chest. Belly Crunches  Do these steps 5-10 times in a row: 1. Lie on your back on a firm bed or the floor with your legs stretched out. 2. Bend your knees so they point up to the ceiling. Your feet should be flat on the floor. 3. Cross your arms over your chest. 4. Tip your chin a little bit toward your chest but do not bend your neck. 5. Tighten your belly muscles and slowly raise your chest just enough to lift your shoulder blades a tiny bit off of the floor. 6. Slowly lower your chest and your head to the floor. Back Lifts  Do these steps  5-10 times in a row: 1. Lie on your belly (face-down) with your arms at your sides, and rest your forehead on the floor. 2. Tighten the muscles in your legs and your butt. 3. Slowly lift your chest off of the floor while you keep your hips on the floor. Keep the back of your head in line with the curve in your back. Look at the floor while you do this. 4. Stay in this position for 3-5 seconds. 5. Slowly lower your chest and your face to the floor. Contact a doctor if:  Your back pain gets a lot worse when you do an exercise.  Your back pain does not lessen 2 hours after you exercise. If you have any of these problems, stop doing the exercises. Do not do them again unless your doctor says it is okay. Get help right away if:  You have sudden, very bad back pain. If this happens, stop doing the exercises. Do not do them again unless your doctor says it is okay. This information is not intended to replace advice given to you by your health care provider. Make sure you discuss any questions you have with your health care provider. Document Released: 07/14/2010 Document Revised: 11/17/2015 Document Reviewed: 08/05/2014  2017 [employer]

## 2016-07-25 NOTE — Progress Notes (Signed)
Subjective:    Patient ID: Felicia Solis, female    DOB: January 13, 1966, 51 y.o.   MRN: [id number]  HPI  Pt states she did work on Monday. She still has moderate back pain. Pt has degenerative type changes in lower back. Pt described work accident on last visit. Please see note.  Pt continue to work. Her pain level after work  other day was level 8/10.   When not working pain is level 2/10   Patient can sleep/pain not keeping her up at night.  Pt has been taking diclofenac.   Pt has not told her work about this injury. Pt states she  needs to work but also aware if work and need rest pain may continue.  No saddle anesthesia, no incontinence, and  leg weakness or foot drop. No radicular pain.       Review of Systems  Constitutional: Negative for chills, fatigue and fever.  Respiratory: Negative for cough, chest tightness, shortness of breath and wheezing.   Cardiovascular: Negative for chest pain and palpitations.  Gastrointestinal: Negative for abdominal pain, constipation and diarrhea.  Musculoskeletal: Positive for back pain. Negative for myalgias, neck pain and neck stiffness.  Skin: Negative for rash.  Hematological: Negative for adenopathy.    Past Medical History:  Diagnosis Date  . Anemia    anemia  when pregnant.  10 yrs ago.   . Angina    pt states she has occasional chest pains no other sxs.. seen in ed  at  [facility] .. all tsts negative.   Marland Kitchen Anxiety   . Asthma 5 yrs ago    told she had asthma.. does not remember doctors name.   . Depression    takes citalopram  . Diabetes ([place]) 2016  . Headache(784.0)    years ago told h/a s were migraines.   . Hyperlipidemia   . Renal disorder    kidney stones  . Shortness of breath    with exertion  . Thyroid disease   . Tuberculosis    pt states she took 9 months of medicine for positive tb skin test.      Social History   Social History  . Marital status: Married    Spouse name: N/A  . Number of  children: 3  . Years of education: N/A   Occupational History  . CNA    Social History Main Topics  . Smoking status: Never Smoker  . Smokeless tobacco: Never Used  . Alcohol use No  . Drug use: No  . Sexual activity: Yes    Partners: Male    Birth control/ protection: Surgical   Other Topics Concern  . Not on file   Social History Narrative   No exercise--  Walking and lifting pts at work    Past Surgical History:  Procedure Laterality Date  . DILATION AND CURETTAGE OF UTERUS  1999  . ENDOMETRIAL ABLATION    . LAPAROSCOPY     years ago ..pt does not know where surgery was done ... states" my stomach would blow up then go down"  . THYROIDECTOMY  09/28/2011   Procedure: THYROIDECTOMY;  Surgeon: Izora Gala, MD;  Location: [place];  Service: ENT;  Laterality: Right;  RIGHT THYROIDECTOMY WITH FROZEN SECTION  . TUBAL LIGATION      Family History  Problem Relation Age of Onset  . Diabetes Mother   . Hypertension Mother   . Hyperlipidemia Mother   . Gallbladder disease Mother   .  Heart disease Mother   . Diabetes Sister   . Hypertension Father   . Hyperlipidemia Father   . Colon polyps Father   . Ovarian cancer Maternal Grandmother   . Gallbladder disease Maternal Grandmother   . Colon cancer Paternal Grandfather   . Breast cancer Maternal Aunt   . Heart disease Other     early cad  . Diabetes Brother   . Heart attack Brother   . Anesthesia problems Neg Hx   . Hypotension Neg Hx   . Malignant hyperthermia Neg Hx   . Pseudochol deficiency Neg Hx   . Esophageal cancer Neg Hx     Allergies  Allergen Reactions  . Lisinopril Cough    Current Outpatient Prescriptions on File Prior to Visit  Medication Sig Dispense Refill  . acetaminophen (TYLENOL) 500 MG tablet Take 1,000 mg by mouth every 6 (six) hours as needed for mild pain.    Marland Kitchen aspirin EC 81 MG tablet Take 1 tablet (81 mg total) by mouth daily. 90 tablet 3  . Biotin 5000 MCG CAPS Take 1 capsule by mouth daily.     . Cholecalciferol (VITAMIN D3) 2000 units TABS Take 1 tablet by mouth daily.    . citalopram (CELEXA) 20 MG tablet Take 1 tablet (20 mg total) by mouth daily. 30 tablet 11  . cyanocobalamin 500 MCG tablet Take 500 mcg by mouth daily.    . cyclobenzaprine (FLEXERIL) 10 MG tablet Take 1 tablet (10 mg total) by mouth 3 (three) times daily as needed for muscle spasms. 30 tablet 0  . cyclobenzaprine (FLEXERIL) 5 MG tablet Take 1 tablet (5 mg total) by mouth at bedtime. 10 tablet 0  . diclofenac (VOLTAREN) 75 MG EC tablet Take 1 tablet (75 mg total) by mouth 2 (two) times daily. 30 tablet 0  . Flaxseed, Linseed, (FLAXSEED OIL PO) Take 1,300 mg by mouth daily.    . fluticasone (FLONASE) 50 MCG/ACT nasal spray Place 2 sprays into both nostrils daily. 16 g 1  . Ginkgo Biloba Extract 60 MG CAPS Take 60 mg by mouth daily.    Marland Kitchen glucose blood (ONETOUCH VERIO) test strip Onetouch Verio Check blood sugar twice daily 100 each 12  . losartan (COZAAR) 25 MG tablet Take 1 tablet (25 mg total) by mouth daily. 30 tablet 5  . metFORMIN (GLUCOPHAGE-XR) 500 MG 24 hr tablet Take 1,000 mg by mouth daily with breakfast.    . Multiple Vitamin (MULTIVITAMIN) tablet Take 1 tablet by mouth daily.    . Omega-3 Fatty Acids (FISH OIL) 1200 MG CAPS Take 1,200 mg by mouth daily.    Glory Rosebush DELICA LANCETS FINE MISC Check blood sugar twice daily 100 each 12  . pantoprazole (PROTONIX) 40 MG tablet Take 1 tablet (40 mg total) by mouth daily. 30 tablet 3  . simvastatin (ZOCOR) 40 MG tablet Take 1 tablet (40 mg total) by mouth at bedtime. 30 tablet 2  . SitaGLIPtin-MetFORMIN HCl (JANUMET XR) [phone] MG TB24 1 po qd (Patient not taking: Reported on 07/25/2016) 60 tablet 2   No current facility-administered medications on file prior to visit.     BP 113/78   Pulse 76   Temp 98.2 F (36.8 C) (Oral)   Ht 5\' 6"  (1.676 m)   Wt 224 lb 3.2 oz (101.7 kg)   SpO2 100%   BMI 36.19 kg/m       Objective:   Physical  Exam  General Appearance- Not in acute distress. 4-5/10  Chest and Lung Exam Auscultation: Breath sounds:-Normal. Clear even and unlabored. Adventitious sounds:- No Adventitious sounds.  Cardiovascular Auscultation:Rythm - Regular, rate and rythm. Heart Sounds -Normal heart sounds.  Abdomen Inspection:-Inspection Normal.  Palpation/Perucssion: Palpation and Percussion of the abdomen reveal- Non Tender, No Rebound tenderness, No rigidity(Guarding) and No Palpable abdominal masses.  Liver:-Normal.  Spleen:- Normal.   Back Mid lumbar spine tenderness to palpation. Pain on straight leg lift. Pain on lateral movements and flexion/extension of the spine.  Lower ext neurologic  Mild- mid lumbar tenderness to palpation. L5-S1 sensation intact bilaterally. Normal patellar reflexes bilaterally. No foot drop bilaterally.  T-spine- faint lower t-spine pain on palpation.      Assessment & Plan:  For your back pain will refer to sports medicine since pain is persisting now about 3 weeks.  Will rx diclofenac refill , flexeril refill and add norco to your treatment regimen.  Would recommend cutting back on your hours or do light duty. If you get light duty sheet I can fill out.   Follow up in 2 weeks or as needed  Kashia Brossard, Percell Miller, [employer]

## 2016-07-30 ENCOUNTER — Ambulatory Visit (INDEPENDENT_AMBULATORY_CARE_PROVIDER_SITE_OTHER): Payer: BLUE CROSS/BLUE SHIELD | Admitting: Family Medicine

## 2016-07-30 ENCOUNTER — Encounter: Payer: Self-pay | Admitting: Family Medicine

## 2016-07-30 DIAGNOSIS — S29019A Strain of muscle and tendon of unspecified wall of thorax, initial encounter: Secondary | ICD-10-CM | POA: Diagnosis not present

## 2016-07-30 NOTE — Patient Instructions (Addendum)
You have a thoracic strain. I would continue with the ibuprofen OR diclofenac that Percell Miller prescribed (don't take both though). Cyclobenzaprine as needed for spasms. Do home exercises every day. Heat for spasms 15 minutes at a time 3-4 times a day. See work note for restrictions. Consider physical therapy if not improving. Prednisone, pain medication are options but unlikely to help based on your exam. Follow up with me in 2 weeks for reevaluation.

## 2016-07-31 NOTE — Progress Notes (Signed)
PCP: [name], DO  Subjective:   HPI: Patient is a 51 y.o. female here for mid back pain.  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 She rushed in and lifted him up, transferred him to recliner then developed pain on right side of mid-back. Pain level up to 5/10, can be sharp. Worse with twisting motions. Tried tylenol, ibuprofen, heat, diclofenac, cyclobenzaprine. No radiation into extremities. No numbness or tingling. No bowel/bladder dysfunction.  Past Medical History:  Diagnosis Date  . Anemia    anemia  when pregnant.  10 yrs ago.   . Angina    pt states she has occasional chest pains no other sxs.. seen in ed  at  [facility] .. all tsts negative.   Marland Kitchen Anxiety   . Asthma 5 yrs ago    told she had asthma.. does not remember doctors name.   . Depression    takes citalopram  . Diabetes ([place]) 2016  . Headache(784.0)    years ago told h/a s were migraines.   . Hyperlipidemia   . Renal disorder    kidney stones  . Shortness of breath    with exertion  . Thyroid disease   . Tuberculosis    pt states she took 9 months of medicine for positive tb skin test.     Current Outpatient Prescriptions on File Prior to Visit  Medication Sig Dispense Refill  . acetaminophen (TYLENOL) 500 MG tablet Take 1,000 mg by mouth every 6 (six) hours as needed for mild pain.    Marland Kitchen aspirin EC 81 MG tablet Take 1 tablet (81 mg total) by mouth daily. 90 tablet 3  . Biotin 5000 MCG CAPS Take 1 capsule by mouth daily.    . Cholecalciferol (VITAMIN D3) 2000 units TABS Take 1 tablet by mouth daily.    . citalopram (CELEXA) 20 MG tablet Take 1 tablet (20 mg total) by mouth daily. 30 tablet 11  . cyanocobalamin 500 MCG tablet Take 500 mcg by mouth daily.    . cyclobenzaprine (FLEXERIL) 10 MG tablet Take 1 tablet (10 mg total) by mouth at bedtime. 10 tablet 0  .  cyclobenzaprine (FLEXERIL) 5 MG tablet Take 1 tablet (5 mg total) by mouth at bedtime. 10 tablet 0  . diclofenac (VOLTAREN) 75 MG EC tablet Take 1 tablet (75 mg total) by mouth 2 (two) times daily. 30 tablet 0  . Flaxseed, Linseed, (FLAXSEED OIL PO) Take 1,300 mg by mouth daily.    . fluticasone (FLONASE) 50 MCG/ACT nasal spray Place 2 sprays into both nostrils daily. 16 g 1  . Ginkgo Biloba Extract 60 MG CAPS Take 60 mg by mouth daily.    Marland Kitchen glucose blood (ONETOUCH VERIO) test strip Onetouch Verio Check blood sugar twice daily 100 each 12  . HYDROcodone-acetaminophen (NORCO) 5-325 MG tablet Take 1 tablet by mouth every 6 (six) hours as needed for moderate pain. 16 tablet 0  . losartan (COZAAR) 25 MG tablet Take 1 tablet (25 mg total) by mouth daily. 30 tablet 5  . metFORMIN (GLUCOPHAGE-XR) 500 MG 24 hr tablet Take 1,000 mg by mouth daily with breakfast.    . Multiple Vitamin (MULTIVITAMIN) tablet Take 1 tablet by mouth daily.    . Omega-3 Fatty Acids (FISH OIL) 1200 MG CAPS Take 1,200 mg by mouth daily.    Marland Kitchen  ONETOUCH DELICA LANCETS FINE MISC Check blood sugar twice daily 100 each 12  . pantoprazole (PROTONIX) 40 MG tablet Take 1 tablet (40 mg total) by mouth daily. 30 tablet 3  . simvastatin (ZOCOR) 40 MG tablet Take 1 tablet (40 mg total) by mouth at bedtime. 30 tablet 2  . SitaGLIPtin-MetFORMIN HCl (JANUMET XR) [phone] MG TB24 1 po qd (Patient not taking: Reported on 07/25/2016) 60 tablet 2   No current facility-administered medications on file prior to visit.     Past Surgical History:  Procedure Laterality Date  . DILATION AND CURETTAGE OF UTERUS  1999  . ENDOMETRIAL ABLATION    . LAPAROSCOPY     years ago ..pt does not know where surgery was done ... states" my stomach would blow up then go down"  . THYROIDECTOMY  09/28/2011   Procedure: THYROIDECTOMY;  Surgeon: Izora Gala, MD;  Location: [place];  Service: ENT;  Laterality: Right;  RIGHT THYROIDECTOMY WITH FROZEN SECTION  . TUBAL  LIGATION      Allergies  Allergen Reactions  . Lisinopril Cough    Social History   Social History  . Marital status: Married    Spouse name: N/A  . Number of children: 3  . Years of education: N/A   Occupational History  . CNA    Social History Main Topics  . Smoking status: Never Smoker  . Smokeless tobacco: Never Used  . Alcohol use No  . Drug use: No  . Sexual activity: Yes    Partners: Male    Birth control/ protection: Surgical   Other Topics Concern  . Not on file   Social History Narrative   No exercise--  Walking and lifting pts at work    Family History  Problem Relation Age of Onset  . Diabetes Mother   . Hypertension Mother   . Hyperlipidemia Mother   . Gallbladder disease Mother   . Heart disease Mother   . Diabetes Sister   . Hypertension Father   . Hyperlipidemia Father   . Colon polyps Father   . Ovarian cancer Maternal Grandmother   . Gallbladder disease Maternal Grandmother   . Colon cancer Paternal Grandfather   . Breast cancer Maternal Aunt   . Heart disease Other     early cad  . Diabetes Brother   . Heart attack Brother   . Anesthesia problems Neg Hx   . Hypotension Neg Hx   . Malignant hyperthermia Neg Hx   . Pseudochol deficiency Neg Hx   . Esophageal cancer Neg Hx     BP 114/74   Pulse 84   Ht 5\' 7"  (1.702 m)   Wt 222 lb (100.7 kg)   BMI 34.77 kg/m   Review of Systems: See HPI above.     Objective:  Physical Exam:  Gen: NAD, comfortable in exam room  Back: No gross deformity, scoliosis. TTP right thoracic paraspinal region.  No midline or bony TTP. FROM with pain on trunk rotation. Strength 5/5 all muscle groups upper and lower extremities. Negative SLRs. Sensation intact to light touch bilaterally.  Assessment & Plan:  1. 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  She would like to start with home exercises instead of physical therapy - this was reviewed today.  Continue  ibuprofen OR diclofenac prescribed by PCP.  Flexeril as needed for spasms.  Heat as well.  Light duty note provided.  F/u in 2 weeks.

## 2016-07-31 NOTE — Assessment & Plan Note (Signed)
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  She would like to start with home exercises instead of physical therapy - this was reviewed today.  Continue ibuprofen OR diclofenac prescribed by PCP.  Flexeril as needed for spasms.  Heat as well.  Light duty note provided.  F/u in 2 weeks.

## 2016-08-02 DIAGNOSIS — E041 Nontoxic single thyroid nodule: Secondary | ICD-10-CM | POA: Insufficient documentation

## 2016-08-03 ENCOUNTER — Other Ambulatory Visit: Payer: Self-pay | Admitting: Family Medicine

## 2016-08-03 DIAGNOSIS — E1169 Type 2 diabetes mellitus with other specified complication: Secondary | ICD-10-CM

## 2016-08-03 DIAGNOSIS — E1165 Type 2 diabetes mellitus with hyperglycemia: Secondary | ICD-10-CM

## 2016-08-03 DIAGNOSIS — IMO0002 Reserved for concepts with insufficient information to code with codable children: Secondary | ICD-10-CM

## 2016-08-03 DIAGNOSIS — E1151 Type 2 diabetes mellitus with diabetic peripheral angiopathy without gangrene: Secondary | ICD-10-CM

## 2016-08-06 MED ORDER — SITAGLIP PHOS-METFORMIN HCL ER 100-1000 MG PO TB24: 1.0000 | ORAL_TABLET | Freq: Every day | ORAL | 2 refills | Status: DC

## 2016-08-06 NOTE — Telephone Encounter (Signed)
Per 01/2016 office note, metformin was stopped and pt was placed on combo, Janumet XR.

## 2016-08-06 NOTE — Telephone Encounter (Signed)
Spoke with pt. She states she had not been able to afford janumet and was wanting cheaper alternative. Placed copay discount card at front office for pt to pick up and sent refill.

## 2016-08-06 NOTE — Addendum Note (Signed)
Addended by: Kelle Darting A on: 08/06/2016 04:17 PM   Modules accepted: Orders

## 2016-08-07 ENCOUNTER — Other Ambulatory Visit: Payer: Self-pay | Admitting: Family Medicine

## 2016-08-07 MED ORDER — METFORMIN HCL ER 500 MG PO TB24: 1000.0000 mg | ORAL_TABLET | Freq: Every day | ORAL | 2 refills | Status: DC

## 2016-08-09 ENCOUNTER — Ambulatory Visit: Payer: Self-pay | Admitting: Internal Medicine

## 2016-08-14 ENCOUNTER — Ambulatory Visit (INDEPENDENT_AMBULATORY_CARE_PROVIDER_SITE_OTHER): Payer: BLUE CROSS/BLUE SHIELD | Admitting: Family Medicine

## 2016-08-14 ENCOUNTER — Encounter: Payer: Self-pay | Admitting: Family Medicine

## 2016-08-14 VITALS — BP 115/74 | HR 84 | Ht 67.0 in | Wt 222.0 lb

## 2016-08-14 DIAGNOSIS — S29019D Strain of muscle and tendon of unspecified wall of thorax, subsequent encounter: Secondary | ICD-10-CM | POA: Diagnosis not present

## 2016-08-14 NOTE — Patient Instructions (Signed)
You have a thoracic strain. Take diclofenac OR ibuprofen for pain and inflammation. Cyclobenzaprine as needed for spasms. Consider physical therapy downstairs - go twice a week if you do this. Do home exercises every day. Heat for spasms 15 minutes at a time 3-4 times a day. See work note for restrictions. Follow up with me in 4 weeks for reevaluation.

## 2016-08-16 ENCOUNTER — Encounter: Payer: Self-pay | Admitting: Internal Medicine

## 2016-08-16 NOTE — Progress Notes (Signed)
PCP: [name], DO  Subjective:   HPI: Patient is a 51 y.o. female here for mid back pain.  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 She rushed in and lifted him up, transferred him to recliner then developed pain on right side of mid-back. Pain level up to 5/10, can be sharp. Worse with twisting motions. Tried tylenol, ibuprofen, heat, diclofenac, cyclobenzaprine. No radiation into extremities. No numbness or tingling. No bowel/bladder dysfunction.  2/20: Patient reports her back has improved some. She is taking diclofenac and flexeril. Working on [employer] duty. Still reports back pain thoracic area at 5/10 level though. Worse arching back. Doing home exercises, stretches. No skin changes, numbness.  Past Medical History:  Diagnosis Date  . Anemia    anemia  when pregnant.  10 yrs ago.   . Angina    pt states she has occasional chest pains no other sxs.. seen in ed  at  [facility] .. all tsts negative.   Marland Kitchen Anxiety   . Asthma 5 yrs ago    told she had asthma.. does not remember doctors name.   . Depression    takes citalopram  . Diabetes ([place]) 2016  . Headache(784.0)    years ago told h/a s were migraines.   . Hyperlipidemia   . Renal disorder    kidney stones  . Shortness of breath    with exertion  . Thyroid disease   . Tuberculosis    pt states she took 9 months of medicine for positive tb skin test.     Current Outpatient Prescriptions on File Prior to Visit  Medication Sig Dispense Refill  . acetaminophen (TYLENOL) 500 MG tablet Take 1,000 mg by mouth every 6 (six) hours as needed for mild pain.    Marland Kitchen aspirin EC 81 MG tablet Take 1 tablet (81 mg total) by mouth daily. 90 tablet 3  . Biotin 5000 MCG CAPS Take 1 capsule by mouth daily.    . Cholecalciferol (VITAMIN D3) 2000 units TABS Take 1 tablet by mouth daily.     . citalopram (CELEXA) 20 MG tablet Take 1 tablet (20 mg total) by mouth daily. 30 tablet 11  . cyanocobalamin 500 MCG tablet Take 500 mcg by mouth daily.    . cyclobenzaprine (FLEXERIL) 10 MG tablet Take 1 tablet (10 mg total) by mouth at bedtime. 10 tablet 0  . cyclobenzaprine (FLEXERIL) 5 MG tablet Take 1 tablet (5 mg total) by mouth at bedtime. 10 tablet 0  . diclofenac (VOLTAREN) 75 MG EC tablet Take 1 tablet (75 mg total) by mouth 2 (two) times daily. 30 tablet 0  . Flaxseed, Linseed, (FLAXSEED OIL PO) Take 1,300 mg by mouth daily.    . fluticasone (FLONASE) 50 MCG/ACT nasal spray Place 2 sprays into both nostrils daily. 16 g 1  . Ginkgo Biloba Extract 60 MG CAPS Take 60 mg by mouth daily.    Marland Kitchen glucose blood (ONETOUCH VERIO) test strip Onetouch Verio Check blood sugar twice daily 100 each 12  . HYDROcodone-acetaminophen (NORCO) 5-325 MG tablet Take 1 tablet by mouth every 6 (six) hours as needed for moderate pain. 16 tablet 0  . losartan (COZAAR) 25 MG tablet Take 1 tablet (25 mg total) by mouth daily. 30 tablet 5  . metFORMIN (GLUCOPHAGE-XR) 500 MG 24 hr tablet Take 2 tablets (  1,000 mg total) by mouth daily with breakfast. 60 tablet 2  . Multiple Vitamin (MULTIVITAMIN) tablet Take 1 tablet by mouth daily.    . Omega-3 Fatty Acids (FISH OIL) 1200 MG CAPS Take 1,200 mg by mouth daily.    Glory Rosebush DELICA LANCETS FINE MISC Check blood sugar twice daily 100 each 12  . pantoprazole (PROTONIX) 40 MG tablet Take 1 tablet (40 mg total) by mouth daily. 30 tablet 3  . simvastatin (ZOCOR) 40 MG tablet Take 1 tablet (40 mg total) by mouth at bedtime. 30 tablet 2  . SitaGLIPtin-MetFORMIN HCl (JANUMET XR) [phone] MG TB24 Take 1 tablet by mouth daily. 60 tablet 2   No current facility-administered medications on file prior to visit.     Past Surgical History:  Procedure Laterality Date  . DILATION AND CURETTAGE OF UTERUS  1999  . ENDOMETRIAL ABLATION    . LAPAROSCOPY     years ago ..pt does not  know where surgery was done ... states" my stomach would blow up then go down"  . THYROIDECTOMY  09/28/2011   Procedure: THYROIDECTOMY;  Surgeon: Izora Gala, MD;  Location: [place];  Service: ENT;  Laterality: Right;  RIGHT THYROIDECTOMY WITH FROZEN SECTION  . TUBAL LIGATION      Allergies  Allergen Reactions  . Lisinopril Cough    Social History   Social History  . Marital status: Married    Spouse name: N/A  . Number of children: 3  . Years of education: N/A   Occupational History  . CNA    Social History Main Topics  . Smoking status: Never Smoker  . Smokeless tobacco: Never Used  . Alcohol use No  . Drug use: No  . Sexual activity: Yes    Partners: Male    Birth control/ protection: Surgical   Other Topics Concern  . Not on file   Social History Narrative   No exercise--  Walking and lifting pts at work    Family History  Problem Relation Age of Onset  . Diabetes Mother   . Hypertension Mother   . Hyperlipidemia Mother   . Gallbladder disease Mother   . Heart disease Mother   . Diabetes Sister   . Hypertension Father   . Hyperlipidemia Father   . Colon polyps Father   . Ovarian cancer Maternal Grandmother   . Gallbladder disease Maternal Grandmother   . Colon cancer Paternal Grandfather   . Breast cancer Maternal Aunt   . Heart disease Other     early cad  . Diabetes Brother   . Heart attack Brother   . Anesthesia problems Neg Hx   . Hypotension Neg Hx   . Malignant hyperthermia Neg Hx   . Pseudochol deficiency Neg Hx   . Esophageal cancer Neg Hx     BP 115/74   Pulse 84   Ht 5\' 7"  (1.702 m)   Wt 222 lb (100.7 kg)   BMI 34.77 kg/m   Review of Systems: See HPI above.     Objective:  Physical Exam:  Gen: NAD, comfortable in exam room  Back: No gross deformity, scoliosis. TTP right thoracic paraspinal region.  No midline or bony TTP. FROM with pain on trunk rotation. Strength 5/5 all muscle groups upper and lower extremities. Negative  SLRs. Sensation intact to light touch bilaterally.  Assessment & Plan:  1. 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  She reports feeling some better though pain level about  the same.  Encouraged physical therapy in addition to her home exercises.  Diclofenac with flexeril as needed for spasms.  Extend light duty for 4 more weeks.

## 2016-08-16 NOTE — Assessment & Plan Note (Signed)
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  She reports feeling some better though pain level about the same.  Encouraged physical therapy in addition to her home exercises.  Diclofenac with flexeril as needed for spasms.  Extend light duty for 4 more weeks.

## 2016-08-21 ENCOUNTER — Ambulatory Visit (INDEPENDENT_AMBULATORY_CARE_PROVIDER_SITE_OTHER): Payer: BLUE CROSS/BLUE SHIELD | Admitting: Family Medicine

## 2016-08-21 ENCOUNTER — Encounter: Payer: Self-pay | Admitting: Family Medicine

## 2016-08-21 VITALS — BP 101/69 | HR 79 | Temp 98.1°F | Resp 16 | Ht 67.0 in | Wt 229.2 lb

## 2016-08-21 DIAGNOSIS — M62830 Muscle spasm of back: Secondary | ICD-10-CM | POA: Diagnosis not present

## 2016-08-21 DIAGNOSIS — IMO0002 Reserved for concepts with insufficient information to code with codable children: Secondary | ICD-10-CM

## 2016-08-21 DIAGNOSIS — E1165 Type 2 diabetes mellitus with hyperglycemia: Secondary | ICD-10-CM

## 2016-08-21 DIAGNOSIS — E118 Type 2 diabetes mellitus with unspecified complications: Secondary | ICD-10-CM

## 2016-08-21 DIAGNOSIS — Z0000 Encounter for general adult medical examination without abnormal findings: Secondary | ICD-10-CM | POA: Diagnosis not present

## 2016-08-21 MED ORDER — CYCLOBENZAPRINE HCL 10 MG PO TABS: 10.0000 mg | ORAL_TABLET | Freq: Every day | ORAL | 0 refills | Status: DC

## 2016-08-21 MED ORDER — DICLOFENAC SODIUM 75 MG PO TBEC: 75.0000 mg | DELAYED_RELEASE_TABLET | Freq: Two times a day (BID) | ORAL | 0 refills | Status: DC

## 2016-08-21 NOTE — Progress Notes (Signed)
Subjective:     Felicia Solis is a 51 y.o. female and is here for a comprehensive physical exam. The patient reports no problems.-- con't with back pain. Seeing ortho but needs refill  Social History   Social History  . Marital status: Married    Spouse name: N/A  . Number of children: 3  . Years of education: N/A   Occupational History  . CNA    Social History Main Topics  . Smoking status: Never Smoker  . Smokeless tobacco: Never Used  . Alcohol use No  . Drug use: No  . Sexual activity: Yes    Partners: Male    Birth control/ protection: Surgical   Other Topics Concern  . Not on file   Social History Narrative   No exercise--  Walking and lifting pts at work   Health Maintenance  Topic Date Due  . PNEUMOCOCCAL POLYSACCHARIDE VACCINE (1) 10/12/1966  . TETANUS/TDAP  08/27/2015  . OPHTHALMOLOGY EXAM  04/25/2016  . HEMOGLOBIN A1C  08/12/2016  . INFLUENZA VACCINE  08/21/2017 (Originally 01/24/2016)  . MAMMOGRAM  05/14/2017  . PAP SMEAR  08/06/2017  . FOOT EXAM  08/21/2017  . COLONOSCOPY  12/02/2024  . HIV Screening  Completed    The following portions of the patient's history were reviewed and updated as appropriate:  She  has a past medical history of Anemia; Angina; Anxiety; Asthma (5 yrs ago ); Depression; Diabetes ([place]) (2016); Headache(784.0); Hyperlipidemia; Renal disorder; Shortness of breath; Thyroid disease; and Tuberculosis. She  does not have any pertinent problems on file. She  has a past surgical history that includes Endometrial ablation; Tubal ligation; laparoscopy; Dilation and curettage of uterus (1999); and Thyroidectomy (09/28/2011). Her family history includes Breast cancer in her maternal aunt; Colon cancer in her paternal grandfather; Colon polyps in her father; Diabetes in her brother, mother, and sister; Gallbladder disease in her maternal grandmother and mother; Heart attack in her brother; Heart disease in her mother and other; Hyperlipidemia in  her father and mother; Hypertension in her father and mother; Ovarian cancer in her maternal grandmother. She  reports that she has never smoked. She has never used smokeless tobacco. She reports that she does not drink alcohol or use drugs. She has a current medication list which includes the following prescription(s): acetaminophen, aspirin ec, biotin, vitamin d3, citalopram, cyanocobalamin, cyclobenzaprine, diclofenac, flaxseed (linseed), fluticasone, ginkgo biloba extract, glucose blood, hydrocodone-acetaminophen, losartan, multivitamin, fish oil, onetouch delica lancets fine, pantoprazole, simvastatin, sitagliptin-metformin hcl, and metformin. Current Outpatient Prescriptions on File Prior to Visit  Medication Sig Dispense Refill  . acetaminophen (TYLENOL) 500 MG tablet Take 1,000 mg by mouth every 6 (six) hours as needed for mild pain.    Marland Kitchen aspirin EC 81 MG tablet Take 1 tablet (81 mg total) by mouth daily. 90 tablet 3  . Biotin 5000 MCG CAPS Take 1 capsule by mouth daily.    . Cholecalciferol (VITAMIN D3) 2000 units TABS Take 1 tablet by mouth daily.    . citalopram (CELEXA) 20 MG tablet Take 1 tablet (20 mg total) by mouth daily. 30 tablet 11  . cyanocobalamin 500 MCG tablet Take 500 mcg by mouth daily.    . Flaxseed, Linseed, (FLAXSEED OIL PO) Take 1,300 mg by mouth daily.    . fluticasone (FLONASE) 50 MCG/ACT nasal spray Place 2 sprays into both nostrils daily. 16 g 1  . Ginkgo Biloba Extract 60 MG CAPS Take 60 mg by mouth daily.    Marland Kitchen glucose blood (  ONETOUCH VERIO) test strip Onetouch Verio Check blood sugar twice daily 100 each 12  . HYDROcodone-acetaminophen (NORCO) 5-325 MG tablet Take 1 tablet by mouth every 6 (six) hours as needed for moderate pain. 16 tablet 0  . losartan (COZAAR) 25 MG tablet Take 1 tablet (25 mg total) by mouth daily. 30 tablet 5  . Multiple Vitamin (MULTIVITAMIN) tablet Take 1 tablet by mouth daily.    . Omega-3 Fatty Acids (FISH OIL) 1200 MG CAPS Take 1,200 mg  by mouth daily.    Glory Rosebush DELICA LANCETS FINE MISC Check blood sugar twice daily 100 each 12  . pantoprazole (PROTONIX) 40 MG tablet Take 1 tablet (40 mg total) by mouth daily. 30 tablet 3  . simvastatin (ZOCOR) 40 MG tablet Take 1 tablet (40 mg total) by mouth at bedtime. 30 tablet 2  . SitaGLIPtin-MetFORMIN HCl (JANUMET XR) [phone] MG TB24 Take 1 tablet by mouth daily. 60 tablet 2  . metFORMIN (GLUCOPHAGE-XR) 500 MG 24 hr tablet Take 2 tablets (1,000 mg total) by mouth daily with breakfast. (Patient not taking: Reported on 08/21/2016) 60 tablet 2   No current facility-administered medications on file prior to visit.    She is allergic to lisinopril..  Review of Systems Review of Systems  Constitutional: Negative for activity change, appetite change and fatigue.  HENT: Negative for hearing loss, congestion, tinnitus and ear discharge.  dentist q63m Eyes: Negative for visual disturbance (see optho q1y -- vision corrected to 20/20 with glasses).  Respiratory: Negative for cough, chest tightness and shortness of breath.   Cardiovascular: Negative for chest pain, palpitations and leg swelling.  Gastrointestinal: Negative for abdominal pain, diarrhea, constipation and abdominal distention.  Genitourinary: Negative for urgency, frequency, decreased urine volume and difficulty urinating.  Musculoskeletal: Negative for back pain, arthralgias and gait problem.  Skin: Negative for color change, pallor and rash.  Neurological: Negative for dizziness, light-headedness, numbness and headaches.  Hematological: Negative for adenopathy. Does not bruise/bleed easily.  Psychiatric/Behavioral: Negative for suicidal ideas, confusion, sleep disturbance, self-injury, dysphoric mood, decreased concentration and agitation.      Objective:    BP 101/69 (BP Location: Left Arm, Patient Position: Sitting, Cuff Size: Large)   Pulse 79   Temp 98.1 F (36.7 C) (Oral)   Resp 16   Ht 5\' 7"  (1.702 m)   Wt 229  lb 3.2 oz (104 kg)   SpO2 97%   BMI 35.90 kg/m  General appearance: alert, cooperative, appears stated age and no distress Head: Normocephalic, without obvious abnormality, atraumatic Eyes: conjunctivae/corneas clear. PERRL, EOM's intact. Fundi benign. Ears: normal TM's and external ear canals both ears Nose: Nares normal. Septum midline. Mucosa normal. No drainage or sinus tenderness. Throat: lips, mucosa, and tongue normal; teeth and gums normal Neck: no adenopathy, no carotid bruit, no JVD, supple, symmetrical, trachea midline and thyroid not enlarged, symmetric, no tenderness/mass/nodules Back: symmetric, no curvature. ROM normal. No CVA tenderness. Lungs: clear to auscultation bilaterally Breasts: gyn Heart: regular rate and rhythm, S1, S2 normal, no murmur, click, rub or gallop Abdomen: soft, non-tender; bowel sounds normal; no masses,  no organomegaly Pelvic: deferred--- gyn Extremities: extremities normal, atraumatic, no cyanosis or edema Pulses: 2+ and symmetric Skin: Skin color, texture, turgor normal. No rashes or lesions Lymph nodes: Cervical, supraclavicular, and axillary nodes normal. Neurologic: Alert and oriented X 3, normal strength and tone. Normal symmetric reflexes. Normal coordination and gait    Assessment:    Healthy female exam.      Plan:  ghm utd Check labs   See After Visit Summary for Counseling Recommendations    1. Uncontrolled type 2 diabetes mellitus with complication, without long-term current use of insulin (HCC)  - Hemoglobin A1c  2. Preventative health care  - CBC - Comprehensive metabolic panel - Lipid panel  3. Back spasm  - cyclobenzaprine (FLEXERIL) 10 MG tablet; Take 1 tablet (10 mg total) by mouth at bedtime.  Dispense: 10 tablet; Refill: 0 - diclofenac (VOLTAREN) 75 MG EC tablet; Take 1 tablet (75 mg total) by mouth 2 (two) times daily.  Dispense: 30 tablet; Refill: 0

## 2016-08-21 NOTE — Progress Notes (Signed)
Pre visit review using our clinic review tool, if applicable. No additional management support is needed unless otherwise documented below in the visit note. 

## 2016-08-21 NOTE — Patient Instructions (Signed)

## 2016-08-22 LAB — LIPID PANEL
CHOLESTEROL: 168 mg/dL (ref 0–200)
HDL: 41 mg/dL (ref >39.00)
LDL Cholesterol: 99 mg/dL (ref 0–99)
NonHDL: 126.86
Total CHOL/HDL Ratio: 4
Triglycerides: 140 mg/dL (ref 0.0–149.0)
VLDL: 28 mg/dL (ref 0.0–40.0)

## 2016-08-22 LAB — CBC
HEMATOCRIT: 37.5 % (ref 36.0–46.0)
HEMOGLOBIN: 12.5 g/dL (ref 12.0–15.0)
MCHC: 33.2 g/dL (ref 30.0–36.0)
MCV: 86 fl (ref 78.0–100.0)
Platelets: 296 10*3/uL (ref 150.0–400.0)
RBC: 4.36 Mil/uL (ref 3.87–5.11)
RDW: 13.9 % (ref 11.5–15.5)
WBC: 8.8 10*3/uL (ref 4.0–10.5)

## 2016-08-22 LAB — COMPREHENSIVE METABOLIC PANEL
ALBUMIN: 4.3 g/dL (ref 3.5–5.2)
ALK PHOS: 78 U/L (ref 39–117)
ALT: 26 U/L (ref 0–35)
AST: 20 U/L (ref 0–37)
BUN: 13 mg/dL (ref 6–23)
CO2: 30 mEq/L (ref 19–32)
Calcium: 10 mg/dL (ref 8.4–10.5)
Chloride: 105 mEq/L (ref 96–112)
Creatinine, Ser: 0.57 mg/dL (ref 0.40–1.20)
GFR: 143.32 mL/min (ref >60.00)
Glucose, Bld: 94 mg/dL (ref 70–99)
POTASSIUM: 4.3 meq/L (ref 3.5–5.1)
Sodium: 140 mEq/L (ref 135–145)
TOTAL PROTEIN: 7.2 g/dL (ref 6.0–8.3)
Total Bilirubin: 0.3 mg/dL (ref 0.2–1.2)

## 2016-08-22 LAB — HEMOGLOBIN A1C: HEMOGLOBIN A1C: 7.4 % — AB (ref 4.6–6.5)

## 2016-08-23 ENCOUNTER — Other Ambulatory Visit: Payer: Self-pay | Admitting: Family Medicine

## 2016-08-23 ENCOUNTER — Ambulatory Visit (INDEPENDENT_AMBULATORY_CARE_PROVIDER_SITE_OTHER): Payer: BLUE CROSS/BLUE SHIELD | Admitting: Family Medicine

## 2016-08-23 ENCOUNTER — Encounter: Payer: Self-pay | Admitting: Family Medicine

## 2016-08-23 VITALS — BP 130/78 | HR 76 | Temp 98.5°F | Resp 16 | Ht 67.0 in | Wt 230.0 lb

## 2016-08-23 DIAGNOSIS — E785 Hyperlipidemia, unspecified: Secondary | ICD-10-CM

## 2016-08-23 DIAGNOSIS — R413 Other amnesia: Secondary | ICD-10-CM

## 2016-08-23 DIAGNOSIS — E118 Type 2 diabetes mellitus with unspecified complications: Secondary | ICD-10-CM

## 2016-08-23 DIAGNOSIS — Z0000 Encounter for general adult medical examination without abnormal findings: Secondary | ICD-10-CM | POA: Diagnosis not present

## 2016-08-23 DIAGNOSIS — I10 Essential (primary) hypertension: Secondary | ICD-10-CM

## 2016-08-23 DIAGNOSIS — IMO0002 Reserved for concepts with insufficient information to code with codable children: Secondary | ICD-10-CM

## 2016-08-23 DIAGNOSIS — E1151 Type 2 diabetes mellitus with diabetic peripheral angiopathy without gangrene: Secondary | ICD-10-CM | POA: Diagnosis not present

## 2016-08-23 DIAGNOSIS — E1165 Type 2 diabetes mellitus with hyperglycemia: Secondary | ICD-10-CM | POA: Diagnosis not present

## 2016-08-23 DIAGNOSIS — E119 Type 2 diabetes mellitus without complications: Secondary | ICD-10-CM

## 2016-08-23 MED ORDER — LOSARTAN POTASSIUM 25 MG PO TABS: 25.0000 mg | ORAL_TABLET | Freq: Every day | ORAL | 5 refills | Status: DC

## 2016-08-23 MED ORDER — SITAGLIP PHOS-METFORMIN HCL ER 100-1000 MG PO TB24: 1.0000 | ORAL_TABLET | Freq: Every day | ORAL | 2 refills | Status: DC

## 2016-08-23 MED ORDER — SIMVASTATIN 40 MG PO TABS: 40.0000 mg | ORAL_TABLET | Freq: Every day | ORAL | 2 refills | Status: DC

## 2016-08-23 NOTE — Progress Notes (Signed)
Subjective:    Patient ID: Felicia Solis, female    DOB: 10/22/1965, 51 y.o.   MRN: [id number]   I acted as a Education administrator for Dr. Royden Purl, LPN   Chief Complaint  Patient presents with  . Memory Loss    HPI  Patient is in today for memory loss. Patient report she's experiencing forgetfulness.  She would also like to go over her labs.   Not other complaints.   Patient Care Team: [name], DO as PCP - General (Family Medicine) Karl Luke, MD as Referring Physician (Optometry) Crawford Givens, MD as Consulting Physician (Obstetrics and Gynecology) Izora Gala, MD as Consulting Physician (Otolaryngology)   Past Medical History:  Diagnosis Date  . Anemia    anemia  when pregnant.  10 yrs ago.   . Angina    pt states she has occasional chest pains no other sxs.. seen in ed  at  [facility] .. all tsts negative.   Marland Kitchen Anxiety   . Asthma 5 yrs ago    told she had asthma.. does not remember doctors name.   . Depression    takes citalopram  . Diabetes ([place]) 2016  . Headache(784.0)    years ago told h/a s were migraines.   . Hyperlipidemia   . Renal disorder    kidney stones  . Shortness of breath    with exertion  . Thyroid disease   . Tuberculosis    pt states she took 9 months of medicine for positive tb skin test.     Past Surgical History:  Procedure Laterality Date  . DILATION AND CURETTAGE OF UTERUS  1999  . ENDOMETRIAL ABLATION    . LAPAROSCOPY     years ago ..pt does not know where surgery was done ... states" my stomach would blow up then go down"  . THYROIDECTOMY  09/28/2011   Procedure: THYROIDECTOMY;  Surgeon: Izora Gala, MD;  Location: [place];  Service: ENT;  Laterality: Right;  RIGHT THYROIDECTOMY WITH FROZEN SECTION  . TUBAL LIGATION      Family History  Problem Relation Age of Onset  . Diabetes Mother   . Hypertension Mother   . Hyperlipidemia Mother   . Gallbladder disease Mother   . Heart disease Mother   . Diabetes Sister   .  Hypertension Father   . Hyperlipidemia Father   . Colon polyps Father   . Ovarian cancer Maternal Grandmother   . Gallbladder disease Maternal Grandmother   . Colon cancer Paternal Grandfather   . Breast cancer Maternal Aunt   . Heart disease Other     early cad  . Diabetes Brother   . Heart attack Brother   . Anesthesia problems Neg Hx   . Hypotension Neg Hx   . Malignant hyperthermia Neg Hx   . Pseudochol deficiency Neg Hx   . Esophageal cancer Neg Hx     Social History   Social History  . Marital status: Married    Spouse name: N/A  . Number of children: 3  . Years of education: N/A   Occupational History  . CNA    Social History Main Topics  . Smoking status: Never Smoker  . Smokeless tobacco: Never Used  . Alcohol use No  . Drug use: No  . Sexual activity: Yes    Partners: Male    Birth control/ protection: Surgical   Other Topics Concern  . Not on file   Social History Narrative  No exercise--  Walking and lifting pts at work    Outpatient Medications Prior to Visit  Medication Sig Dispense Refill  . acetaminophen (TYLENOL) 500 MG tablet Take 1,000 mg by mouth every 6 (six) hours as needed for mild pain.    Marland Kitchen aspirin EC 81 MG tablet Take 1 tablet (81 mg total) by mouth daily. 90 tablet 3  . Biotin 5000 MCG CAPS Take 1 capsule by mouth daily.    . Cholecalciferol (VITAMIN D3) 2000 units TABS Take 1 tablet by mouth daily.    . citalopram (CELEXA) 20 MG tablet Take 1 tablet (20 mg total) by mouth daily. 30 tablet 11  . cyanocobalamin 500 MCG tablet Take 500 mcg by mouth daily.    . cyclobenzaprine (FLEXERIL) 10 MG tablet Take 1 tablet (10 mg total) by mouth at bedtime. 10 tablet 0  . diclofenac (VOLTAREN) 75 MG EC tablet Take 1 tablet (75 mg total) by mouth 2 (two) times daily. 30 tablet 0  . Flaxseed, Linseed, (FLAXSEED OIL PO) Take 1,300 mg by mouth daily.    . fluticasone (FLONASE) 50 MCG/ACT nasal spray Place 2 sprays into both nostrils daily. 16 g 1    . Ginkgo Biloba Extract 60 MG CAPS Take 60 mg by mouth daily.    Marland Kitchen glucose blood (ONETOUCH VERIO) test strip Onetouch Verio Check blood sugar twice daily 100 each 12  . HYDROcodone-acetaminophen (NORCO) 5-325 MG tablet Take 1 tablet by mouth every 6 (six) hours as needed for moderate pain. 16 tablet 0  . Multiple Vitamin (MULTIVITAMIN) tablet Take 1 tablet by mouth daily.    . Omega-3 Fatty Acids (FISH OIL) 1200 MG CAPS Take 1,200 mg by mouth daily.    Glory Rosebush DELICA LANCETS FINE MISC Check blood sugar twice daily 100 each 12  . pantoprazole (PROTONIX) 40 MG tablet Take 1 tablet (40 mg total) by mouth daily. 30 tablet 3  . losartan (COZAAR) 25 MG tablet Take 1 tablet (25 mg total) by mouth daily. 30 tablet 5  . metFORMIN (GLUCOPHAGE-XR) 500 MG 24 hr tablet Take 2 tablets (1,000 mg total) by mouth daily with breakfast. 60 tablet 2  . simvastatin (ZOCOR) 40 MG tablet Take 1 tablet (40 mg total) by mouth at bedtime. 30 tablet 2  . SitaGLIPtin-MetFORMIN HCl (JANUMET XR) [phone] MG TB24 Take 1 tablet by mouth daily. 60 tablet 2   No facility-administered medications prior to visit.     Allergies  Allergen Reactions  . Lisinopril Cough    Review of Systems  Constitutional: Negative for chills, fever and malaise/fatigue.  HENT: Negative for congestion and hearing loss.   Eyes: Negative for blurred vision and discharge.  Respiratory: Negative for cough, sputum production and shortness of breath.   Cardiovascular: Negative for chest pain, palpitations and leg swelling.  Gastrointestinal: Negative for abdominal pain, blood in stool, constipation, diarrhea, heartburn, nausea and vomiting.  Genitourinary: Negative for dysuria, frequency, hematuria and urgency.  Musculoskeletal: Negative for back pain, falls and myalgias.  Skin: Negative for rash.  Neurological: Negative for dizziness, sensory change, loss of consciousness, weakness and headaches.  Endo/Heme/Allergies: Negative for  environmental allergies. Does not bruise/bleed easily.  Psychiatric/Behavioral: Positive for memory loss. Negative for depression and suicidal ideas. The patient is not nervous/anxious and does not have insomnia.        Objective:    Physical Exam  Constitutional: She is oriented to person, place, and time. She appears well-developed and well-nourished. No distress.  HENT:  Head:  Normocephalic and atraumatic.  Eyes: Conjunctivae are normal. Pupils are equal, round, and reactive to light.  Neck: Normal range of motion. No thyromegaly present.  Cardiovascular: Normal rate and regular rhythm.   Pulmonary/Chest: Effort normal and breath sounds normal. She has no wheezes.  Abdominal: Soft. Bowel sounds are normal. There is no tenderness.  Musculoskeletal: Normal range of motion. She exhibits no edema or deformity.  Neurological: She is alert and oriented to person, place, and time.  Skin: Skin is warm and dry. She is not diaphoretic.  Psychiatric: She has a normal mood and affect. Her behavior is normal. Judgment and thought content normal.  mmse 30/30  Nursing note and vitals reviewed.   BP 130/78 (BP Location: Left Arm, Patient Position: Sitting, Cuff Size: Normal)   Pulse 76   Temp 98.5 F (36.9 C) (Oral)   Resp 16   Ht 5\' 7"  (1.702 m)   Wt 230 lb (104.3 kg)   SpO2 97%   BMI 36.02 kg/m  Wt Readings from Last 3 Encounters:  08/23/16 230 lb (104.3 kg)  08/21/16 229 lb 3.2 oz (104 kg)  08/14/16 222 lb (100.7 kg)     Lab Results  Component Value Date   WBC 8.8 08/21/2016   HGB 12.5 08/21/2016   HCT 37.5 08/21/2016   PLT 296.0 08/21/2016   GLUCOSE 94 08/21/2016   CHOL 168 08/21/2016   TRIG 140.0 08/21/2016   HDL 41.00 08/21/2016   LDLCALC 99 08/21/2016   ALT 26 08/21/2016   AST 20 08/21/2016   NA 140 08/21/2016   K 4.3 08/21/2016   CL 105 08/21/2016   CREATININE 0.57 08/21/2016   BUN 13 08/21/2016   CO2 30 08/21/2016   TSH 1.06 08/15/2015   HGBA1C 7.4 (H)  08/21/2016   MICROALBUR 1.2 08/15/2015    Lab Results  Component Value Date   TSH 1.06 08/15/2015   Lab Results  Component Value Date   WBC 8.8 08/21/2016   HGB 12.5 08/21/2016   HCT 37.5 08/21/2016   MCV 86.0 08/21/2016   PLT 296.0 08/21/2016   Lab Results  Component Value Date   NA 140 08/21/2016   K 4.3 08/21/2016   CO2 30 08/21/2016   GLUCOSE 94 08/21/2016   BUN 13 08/21/2016   CREATININE 0.57 08/21/2016   BILITOT 0.3 08/21/2016   ALKPHOS 78 08/21/2016   AST 20 08/21/2016   ALT 26 08/21/2016   PROT 7.2 08/21/2016   ALBUMIN 4.3 08/21/2016   CALCIUM 10.0 08/21/2016   ANIONGAP 6 07/06/2015   GFR 143.32 08/21/2016   Lab Results  Component Value Date   CHOL 168 08/21/2016   Lab Results  Component Value Date   HDL 41.00 08/21/2016   Lab Results  Component Value Date   LDLCALC 99 08/21/2016   Lab Results  Component Value Date   TRIG 140.0 08/21/2016   Lab Results  Component Value Date   CHOLHDL 4 08/21/2016   Lab Results  Component Value Date   HGBA1C 7.4 (H) 08/21/2016       Assessment & Plan:   Problem List Items Addressed This Visit      Unprioritized   Diabetes mellitus type II, uncontrolled ([place])   Relevant Medications   losartan (COZAAR) 25 MG tablet   simvastatin (ZOCOR) 40 MG tablet   SitaGLIPtin-MetFORMIN HCl (JANUMET XR) [phone] MG TB24   Other Relevant Orders   Hemoglobin A1c   Memory loss - Primary    mmse 30/30 Pt is very  nervous about her memory loss and mmse did not help Pt requesting further testing Refer to neuropsych      Relevant Orders   Ambulatory referral to Neuropsychology    Other Visit Diagnoses    Preventative health care       Relevant Orders   CBC   Comprehensive metabolic panel   Lipid panel   Essential hypertension       Relevant Medications   losartan (COZAAR) 25 MG tablet   simvastatin (ZOCOR) 40 MG tablet   Hyperlipidemia, unspecified hyperlipidemia type       Relevant Medications   losartan  (COZAAR) 25 MG tablet   simvastatin (ZOCOR) 40 MG tablet   DM (diabetes mellitus) type II uncontrolled, periph vascular disorder (HCC)       Relevant Medications   losartan (COZAAR) 25 MG tablet   simvastatin (ZOCOR) 40 MG tablet   SitaGLIPtin-MetFORMIN HCl (JANUMET XR) [phone] MG TB24      I have discontinued Ms. Lovejoy's metFORMIN. I am also having her maintain her acetaminophen, glucose blood, ONETOUCH DELICA LANCETS FINE, cyanocobalamin, multivitamin, Ginkgo Biloba Extract, Biotin, (Flaxseed, Linseed, (FLAXSEED OIL PO)), Fish Oil, Vitamin D3, aspirin EC, fluticasone, citalopram, pantoprazole, HYDROcodone-acetaminophen, cyclobenzaprine, diclofenac, losartan, simvastatin, and SitaGLIPtin-MetFORMIN HCl.  Meds ordered this encounter  Medications  . losartan (COZAAR) 25 MG tablet    Sig: Take 1 tablet (25 mg total) by mouth daily.    Dispense:  30 tablet    Refill:  5  . simvastatin (ZOCOR) 40 MG tablet    Sig: Take 1 tablet (40 mg total) by mouth at bedtime.    Dispense:  30 tablet    Refill:  2  . SitaGLIPtin-MetFORMIN HCl (JANUMET XR) [phone] MG TB24    Sig: Take 1 tablet by mouth daily.    Dispense:  60 tablet    Refill:  2    CMA served as scribe during this visit. History, Physical and Plan performed by medical provider. Documentation and orders reviewed and attested to.   [name], DOPatient ID: Felicia Solis, female   DOB: [birth year], 51 y.o.   MRN: [id number]

## 2016-08-23 NOTE — Progress Notes (Signed)
Pre visit review using our clinic review tool, if applicable. No additional management support is needed unless otherwise documented below in the visit note. 

## 2016-08-23 NOTE — Patient Instructions (Signed)
Diabetes Mellitus and Food It is important for you to manage your blood sugar (glucose) level. Your blood glucose level can be greatly affected by what you eat. Eating healthier foods in the appropriate amounts throughout the day at about the same time each day will help you control your blood glucose level. It can also help slow or prevent worsening of your diabetes mellitus. Healthy eating may even help you improve the level of your blood pressure and reach or maintain a healthy weight. General recommendations for healthful eating and cooking habits include:  Eating meals and snacks regularly. Avoid going long periods of time without eating to lose weight.  Eating a diet that consists mainly of plant-based foods, such as fruits, vegetables, nuts, legumes, and whole grains.  Using low-heat cooking methods, such as baking, instead of high-heat cooking methods, such as deep frying.  Work with your dietitian to make sure you understand how to use the Nutrition Facts information on food labels. How can food affect me? Carbohydrates Carbohydrates affect your blood glucose level more than any other type of food. Your dietitian will help you determine how many carbohydrates to eat at each meal and teach you how to count carbohydrates. Counting carbohydrates is important to keep your blood glucose at a healthy level, especially if you are using insulin or taking certain medicines for diabetes mellitus. Alcohol Alcohol can cause sudden decreases in blood glucose (hypoglycemia), especially if you use insulin or take certain medicines for diabetes mellitus. Hypoglycemia can be a life-threatening condition. Symptoms of hypoglycemia (sleepiness, dizziness, and disorientation) are similar to symptoms of having too much alcohol. If your health care provider has given you approval to drink alcohol, do so in moderation and use the following guidelines:  Women should not have more than one drink per day, and men  should not have more than two drinks per day. One drink is equal to: ? 12 oz of beer. ? 5 oz of wine. ? 1 oz of hard liquor.  Do not drink on an empty stomach.  Keep yourself hydrated. Have water, diet soda, or unsweetened iced tea.  Regular soda, juice, and other mixers might contain a lot of carbohydrates and should be counted.  What foods are not recommended? As you make food choices, it is important to remember that all foods are not the same. Some foods have fewer nutrients per serving than other foods, even though they might have the same number of calories or carbohydrates. It is difficult to get your body what it needs when you eat foods with fewer nutrients. Examples of foods that you should avoid that are high in calories and carbohydrates but low in nutrients include:  Trans fats (most processed foods list trans fats on the Nutrition Facts label).  Regular soda.  Juice.  Candy.  Sweets, such as cake, pie, doughnuts, and cookies.  Fried foods.  What foods can I eat? Eat nutrient-rich foods, which will nourish your body and keep you healthy. The food you should eat also will depend on several factors, including:  The calories you need.  The medicines you take.  Your weight.  Your blood glucose level.  Your blood pressure level.  Your cholesterol level.  You should eat a variety of foods, including:  Protein. ? Lean cuts of meat. ? Proteins low in saturated fats, such as fish, egg whites, and beans. Avoid processed meats.  Fruits and vegetables. ? Fruits and vegetables that may help control blood glucose levels, such as apples,   mangoes, and yams.  Dairy products. ? Choose fat-free or low-fat dairy products, such as milk, yogurt, and cheese.  Grains, bread, pasta, and rice. ? Choose whole grain products, such as multigrain bread, whole oats, and brown rice. These foods may help control blood pressure.  Fats. ? Foods containing healthful fats, such as  nuts, avocado, olive oil, canola oil, and fish.  Does everyone with diabetes mellitus have the same meal plan? Because every person with diabetes mellitus is different, there is not one meal plan that works for everyone. It is very important that you meet with a dietitian who will help you create a meal plan that is just right for you. This information is not intended to replace advice given to you by your health care provider. Make sure you discuss any questions you have with your health care provider. Document Released: 03/08/2005 Document Revised: 11/17/2015 Document Reviewed: 05/08/2013 Elsevier Interactive Patient Education  2017 [employer]  

## 2016-08-23 NOTE — Assessment & Plan Note (Signed)
mmse 30/30 Pt is very nervous about her memory loss and mmse did not help Pt requesting further testing Refer to neuropsych

## 2016-08-28 ENCOUNTER — Encounter: Payer: Self-pay | Admitting: Rehabilitative and Restorative Service Providers"

## 2016-08-28 ENCOUNTER — Ambulatory Visit (INDEPENDENT_AMBULATORY_CARE_PROVIDER_SITE_OTHER): Payer: BLUE CROSS/BLUE SHIELD | Admitting: Rehabilitative and Restorative Service Providers"

## 2016-08-28 DIAGNOSIS — M546 Pain in thoracic spine: Secondary | ICD-10-CM

## 2016-08-28 DIAGNOSIS — R29898 Other symptoms and signs involving the musculoskeletal system: Secondary | ICD-10-CM | POA: Diagnosis not present

## 2016-08-28 NOTE — Patient Instructions (Signed)
Sitting with feet flat on floor  Arch and sag mid back Reach one arm overhead and stretch then bend elbow and touch upper back  Shoulder rolls backward Reach arm across chest and stretch with opposite arm Cross arms over chest and rotate to one side hold then repeat to opposite side Tuck chin then tip ear toward side hold and repeat with opposite side  Move to point of stretch not pain repeat 2-3 times/day   TENS UNIT: This is helpful for muscle pain and spasm.   Search and Purchase a TENS 7000 2nd edition at www.tenspros.com. It should be less than $30.     TENS unit instructions: Do not shower or bathe with the unit on Turn the unit off before removing electrodes or batteries If the electrodes lose stickiness add a drop of water to the electrodes after they are disconnected from the unit and place on plastic sheet. If you continued to have difficulty, call the TENS unit company to purchase more electrodes. Do not apply lotion on the skin area prior to use. Make sure the skin is clean and dry as this will help prolong the life of the electrodes. After use, always check skin for unusual red areas, rash or other skin difficulties. If there are any skin problems, does not apply electrodes to the same area. Never remove the electrodes from the unit by pulling the wires. Do not use the TENS unit or electrodes other than as directed. Do not change electrode placement without consultating your therapist or physician. Keep 2 fingers with between each electrode.

## 2016-08-28 NOTE — Therapy (Signed)
[facility] [place], [place], [address] Phone: [phone]   Fax:  [phone]  Physical Therapy Evaluation  Patient Details  Name: Felicia Solis MRN: [id number] Date of Birth: 03-30-1966 Referring Provider: Dr Karlton Lemon   Encounter Date: 08/28/2016      PT End of Session - 08/28/16 1248    Visit Number 1   Number of Visits 12   Date for PT Re-Evaluation 10/09/16   PT Start Time 1148   PT Stop Time 1252   PT Time Calculation (min) 64 min   Activity Tolerance Patient tolerated treatment well      Past Medical History:  Diagnosis Date  . Anemia    anemia  when pregnant.  10 yrs ago.   . Angina    pt states she has occasional chest pains no other sxs.. seen in ed  at  [facility] .. all tsts negative.   Marland Kitchen Anxiety   . Asthma 5 yrs ago    told she had asthma.. does not remember doctors name.   . Depression    takes citalopram  . Diabetes ([place]) 2016  . Headache(784.0)    years ago told h/a s were migraines.   . Hyperlipidemia   . Renal disorder    kidney stones  . Shortness of breath    with exertion  . Thyroid disease   . Tuberculosis    pt states she took 9 months of medicine for positive tb skin test.     Past Surgical History:  Procedure Laterality Date  . DILATION AND CURETTAGE OF UTERUS  1999  . ENDOMETRIAL ABLATION    . LAPAROSCOPY     years ago ..pt does not know where surgery was done ... states" my stomach would blow up then go down"  . THYROIDECTOMY  09/28/2011   Procedure: THYROIDECTOMY;  Surgeon: Izora Gala, MD;  Location: [facility];  Service: ENT;  Laterality: Right;  RIGHT THYROIDECTOMY WITH FROZEN SECTION  . TUBAL LIGATION      There were no vitals filed for this visit.       Subjective Assessment - 08/28/16 1152    Subjective [name] reports that she has been having mid to low back pain for the past 2 - 3 months after she was pulling a resident preventing him from falling.  She lifted in an awkward position. She noticed pain in the mid to low back area later that evening. She was seen by MD about 2 weeks after and treated with medication with some improvement. She continues to have pain in the rib area lower thoracic region. Pain occurs with different movements.    Pertinent History AODM; denies any musculoskeletal injury; lumbar strain 2003; arthritic changes by xray    How long can you sit comfortably? 2-3 hours    How long can you stand comfortably? no limit   How long can you walk comfortably? 4-5 hours    Diagnostic tests xrays (-)    Patient Stated Goals get rid of pain    Currently in Pain? Yes   Pain Score 4    Pain Location Back   Pain Orientation Mid;Lower;Right;Left   Pain Descriptors / Indicators Sharp;Dull   Pain Type Acute pain   Pain Onset More than a month ago   Pain Frequency Intermittent   Aggravating Factors  bending; lifting; reaching; using UE's; prolonged postures    Pain Relieving Factors meds; hot bath/shower; avoid activities that irritate symptoms  [facility] PT Assessment - 08/28/16 0001      Assessment   Medical Diagnosis Thoracic strain   Referring Provider Dr Karlton Lemon    Onset Date/Surgical Date 07/04/16  approximately    Hand Dominance Right   Next MD Visit 4/18   Prior Therapy none      Precautions   Precautions None     Balance Screen   Has the patient fallen in the past 6 months No   Has the patient had a decrease in activity level because of a fear of falling?  No   Is the patient reluctant to leave their home because of a fear of falling?  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 Some pulling with cervical Lt lateral flexion and roatation upper thoracic    Cervical Flexion 48   Cervical Extension 59   Cervical - Right Side Bend 38   Cervical - Left Side Bend 28   Cervical - Right Rotation 70   Cervical - Left Rotation 64   Lumbar Flexion 80%   Lumbar Extension 60%   Lumbar - Right Side Bend 65%   Lumbar - Left Side Bend 60%   Lumbar - Right Rotation 45%   Lumbar - Left Rotation 40%     Strength   Overall Strength Comments bilat U/LE's WNL's throughout   discomfort in thoracic area w/ resistive testing bilat UE's     Palpation   Spinal mobility stiffness and pain with CPA mobs T5/6 to L1/2    Palpation comment muscular tightness through the thoracic paraspinals into traps/lats bilat Rt > Lt                    [facility] Adult PT Treatment/Exercise - 08/28/16 0001      Exercises   Exercises --  gentle stretching program - see HEP instructions     Moist Heat Therapy   Number Minutes Moist Heat 20 Minutes   Moist Heat Location --  thoracic spine      Electrical Stimulation   Electrical Stimulation Location bilat thoracic mid and lower    Electrical Stimulation Action IFC   Electrical Stimulation Parameters to tolerance   Electrical Stimulation Goals Pain;Tone                PT Education - 08/28/16 1241    Education provided Yes   Education Details HEP TENS unit    Person(s) Educated Patient   Methods Explanation;Demonstration;Tactile cues;Verbal cues;Handout   Comprehension Verbalized understanding;Returned demonstration;Verbal cues required;Tactile cues required             PT Long Term Goals - 08/28/16 1253      PT LONG TERM GOAL #1   Title Improve posture  and alignment with patient to demonstrated improved thoracic extension 10/09/16   Time 6   Period  Weeks   Status New     PT LONG TERM GOAL #2   Title Patient to demonstrated normal strength bilat UE's without pain with resistive testing 10/09/16   Time 6   Period Weeks   Status New     PT LONG TERM GOAL #3   Title Patient to report 75-90% decrease in pain through the thoracic spine 10/09/16   Time 6   Period Weeks   Status New     PT LONG TERM GOAL #4   Title Independent in HEP 10/09/16   Time 6   Period Weeks   Status New     PT LONG TERM GOAL #5   Title Improve FOT to </= 33% limitation 10/09/16   Time 6   Period Weeks   Status New               Plan - 08/28/16 1249    Clinical Impression Statement Felicia Solis presents with 2-3 month hostory of thoracic pain Rt > Lt. She has poor posture and alignment; limited trunk and cervical mobility; pain with resistive UE muscle testing; pain with functional activities. She will benefit from PT to address limitations and problems.    Rehab Potential Good   PT Frequency 2x / week   PT Duration 6 weeks   PT Treatment/Interventions Patient/family education;Neuromuscular re-education;ADLs/Self Care Home Management;Cryotherapy;Electrical Stimulation;Iontophoresis 4mg /ml Dexamethasone;Moist Heat;Ultrasound;Dry needling;Manual techniques;Therapeutic activities;Therapeutic exercise   PT Next Visit Plan Continue with gentle stretching and mobility exercise; gradually add pec stretching and posterior girdle strengthening; progress to thoracic mobs as indicated and tolerated; manual work; soft tissue mobilization; modalities.    Consulted and Agree with Plan of Care Patient      Patient will benefit from skilled therapeutic intervention in order to improve the following deficits and impairments:  Postural dysfunction, Improper body mechanics, Pain, Decreased range of motion, Decreased mobility, Decreased strength, Increased fascial restricitons,  Increased muscle spasms, Decreased activity tolerance  Visit Diagnosis: Pain in thoracic spine - Plan: PT plan of care cert/re-cert  Other symptoms and signs involving the musculoskeletal system - Plan: PT plan of care cert/re-cert     Problem List Patient Active Problem List   Diagnosis Date Noted  . Memory loss 08/23/2016  . Thyroid nodule 08/02/2016  . Plantar fasciitis of right foot 12/01/2015  . Knee pain, right 12/01/2015  . Headache, migraine 09/06/2015  . Bilateral knee pain 08/22/2015  . Thoracic myofascial strain 08/22/2015  . Dysphagia 04/10/2015  . Atypical chest pain 04/10/2015  . Epigastric pain 04/10/2015  . Functional diarrhea 04/10/2015  . NSAID long-term use 04/10/2015  . Midepigastric pain 03/22/2015  . Cough 01/07/2015  . Allergic rhinitis 12/13/2014  . Acute bronchitis 12/13/2014  . Blood in stool 10/01/2014  . Diabetes mellitus type II, uncontrolled ([place]) 08/13/2014  . Abnormal ECG 05/14/2012  . Chest pain 10/23/2011  . ASCUS on Pap smear 10/08/2011  . H/O thyroid nodule 08/27/2011  . Depression with anxiety 08/27/2011  . Heavy periods 08/27/2011    Felicia Solis Nilda Simmer PT, MPH  08/28/2016, 1:03 PM  [facility] [place], [place], [address] Phone: [phone]   Fax:  [phone]  Name: Felicia Solis MRN: [id number] Date of Birth: [birth year]

## 2016-08-30 ENCOUNTER — Ambulatory Visit (INDEPENDENT_AMBULATORY_CARE_PROVIDER_SITE_OTHER): Payer: BLUE CROSS/BLUE SHIELD | Admitting: Physical Therapy

## 2016-08-30 DIAGNOSIS — R29898 Other symptoms and signs involving the musculoskeletal system: Secondary | ICD-10-CM | POA: Diagnosis not present

## 2016-08-30 DIAGNOSIS — M546 Pain in thoracic spine: Secondary | ICD-10-CM | POA: Diagnosis not present

## 2016-08-30 NOTE — Patient Instructions (Signed)
Adduction (Passive)    Use other hand to hold elbow and bring arm across front toward opposite side. Hold _20-30___ seconds. Repeat __2-3__ times. Do _1-2___ sessions per day.  Copyright  VHI. All rights reserved.   Arm Reach, Up and Back (Shoulder Mobility / Triceps Stretch)    Stretch arm up while breathing out. Reach back and walk fingers down spine, using other hand to push elbow down. Hold _20-30__ seconds, breathing slowly in and out through pursed lips. Repeat with other arm. Do _2-3__ sessions per day.  Copyright  VHI. All rights reserved.   Healthy Back - Shoulder Roll    Stand straight with arms relaxed at sides. Roll shoulders backward continuously. Do __10-15__ times. Copyright  VHI. All rights reserved.    Ear / Shoulder Stretch    Exhaling, move left ear toward left shoulder. Hold position for _20-30__ seconds. Inhaling, bring head back to center. Repeat to other side. Repeat sequence _2-3__ times. Do _2-3__ times per day.  Copyright  VHI. All rights reserved.   Axial Rotation Stretch (Sitting)    Sit with low back supported. Rotate upper body slowly: start with head, then shoulders and toward hips. Hold a stretch at point of light tension. Repeat to opposite direction. Hold __20-30__ seconds each side. Repeat __2-3__ times. Do _2-3___ sessions per day.  Copyright  VHI. All rights reserved.

## 2016-08-30 NOTE — Therapy (Signed)
[place], [place], [address] Phone: [phone]   Fax:  [phone]  Physical Therapy Treatment  Patient Details  Name: Felicia Solis MRN: [id number] Date of Birth: [birth year] Referring Provider: Dr Karlton Lemon   Encounter Date: 08/30/2016      PT End of Session - 08/30/16 1055    Visit Number 2   Number of Visits 12   Date for PT Re-Evaluation 10/09/16   PT Start Time 1018   PT Stop Time 1107   PT Time Calculation (min) 49 min   Activity Tolerance Patient tolerated treatment well   Behavior During Therapy [facility] for tasks assessed/performed      Past Medical History:  Diagnosis Date  . Anemia    anemia  when pregnant.  10 yrs ago.   . Angina    pt states she has occasional chest pains no other sxs.. seen in ed  at  [facility] .. all tsts negative.   Marland Kitchen Anxiety   . Asthma 5 yrs ago    told she had asthma.. does not remember doctors name.   . Depression    takes citalopram  . Diabetes ([place]) 2016  . Headache(784.0)    years ago told h/a s were migraines.   . Hyperlipidemia   . Renal disorder    kidney stones  . Shortness of breath    with exertion  . Thyroid disease   . Tuberculosis    pt states she took 9 months of medicine for positive tb skin test.     Past Surgical History:  Procedure Laterality Date  . DILATION AND CURETTAGE OF UTERUS  1999  . ENDOMETRIAL ABLATION    . LAPAROSCOPY     years ago ..pt does not know where surgery was done ... states" my stomach would blow up then go down"  . THYROIDECTOMY  09/28/2011   Procedure: THYROIDECTOMY;  Surgeon: Izora Gala, MD;  Location: [facility];  Service: ENT;  Laterality: Right;  RIGHT THYROIDECTOMY WITH FROZEN SECTION  . TUBAL LIGATION      There were no vitals filed for this visit.      Subjective Assessment - 08/30/16 1019    Subjective doing well, still having a little pain in mid/upper back   Pertinent History AODM; denies  any musculoskeletal injury; lumbar strain 2003; arthritic changes by xray    Patient Stated Goals get rid of pain    Currently in Pain? Yes   Pain Score 3    Pain Location Back   Pain Orientation Mid;Lower;Right   Pain Descriptors / Indicators Dull;Sharp   Pain Type Acute pain   Pain Onset More than a month ago   Pain Frequency Intermittent   Aggravating Factors  bending, lifting, reaching, using arms   Pain Relieving Factors meds, head, avoiding positions that aggravate                         [facility] Adult PT Treatment/Exercise - 08/30/16 1022      Exercises   Exercises Shoulder     Shoulder Exercises: Stretch   Cross Chest Stretch 3 reps;30 seconds   Other Shoulder Stretches see pt instructions for details: seated upper trap, latissimus, post shoulder rolls and trunk rotation     Moist Heat Therapy   Number Minutes Moist Heat 15 Minutes   Moist Heat Location --  thoracic spine     Electrical Stimulation   Electrical  Stimulation Location R thoracic paraspinals   Electrical Stimulation Action pre mod   Electrical Stimulation Parameters to tolerance   Electrical Stimulation Goals Pain;Tone     Manual Therapy   Manual Therapy Soft tissue mobilization   Manual therapy comments R thoracic spine along latissimus and paraspinals in prone                PT Education - 08/30/16 1055    Education provided Yes   Education Details pictures to [employer]) Educated Patient   Methods Explanation;Demonstration;Handout   Comprehension Verbalized understanding;Returned demonstration;Need further instruction             PT Long Term Goals - 08/28/16 1253      PT LONG TERM GOAL #1   Title Improve posture and alignment with patient to demonstrated improved thoracic extension 10/09/16   Time 6   Period Weeks   Status New     PT LONG TERM GOAL #2   Title Patient to demonstrated normal strength bilat UE's without pain with resistive testing 10/09/16    Time 6   Period Weeks   Status New     PT LONG TERM GOAL #3   Title Patient to report 75-90% decrease in pain through the thoracic spine 10/09/16   Time 6   Period Weeks   Status New     PT LONG TERM GOAL #4   Title Independent in HEP 10/09/16   Time 6   Period Weeks   Status New     PT LONG TERM GOAL #5   Title Improve FOT to </= 33% limitation 10/09/16   Time 6   Period Weeks   Status New               Plan - 08/30/16 1056    Clinical Impression Statement Pt tolerated session well today, and reports estim very helpful last session.  Continues to have some tightness in thoracic muscles.  Will continue to benefit from PT to [facility] function.   PT Next Visit Plan Continue with gentle stretching and mobility exercise; gradually add pec stretching and posterior girdle strengthening; progress to thoracic mobs as indicated and tolerated; manual work; soft tissue mobilization; modalities.    Consulted and Agree with Plan of Care Patient      Patient will benefit from skilled therapeutic intervention in order to improve the following deficits and impairments:  Postural dysfunction, Improper body mechanics, Pain, Decreased range of motion, Decreased mobility, Decreased strength, Increased fascial restricitons, Increased muscle spasms, Decreased activity tolerance  Visit Diagnosis: Pain in thoracic spine  Other symptoms and signs involving the musculoskeletal system     Problem List Patient Active Problem List   Diagnosis Date Noted  . Memory loss 08/23/2016  . Thyroid nodule 08/02/2016  . Plantar fasciitis of right foot 12/01/2015  . Knee pain, right 12/01/2015  . Headache, migraine 09/06/2015  . Bilateral knee pain 08/22/2015  . Thoracic myofascial strain 08/22/2015  . Dysphagia 04/10/2015  . Atypical chest pain 04/10/2015  . Epigastric pain 04/10/2015  . Functional diarrhea 04/10/2015  . NSAID long-term use 04/10/2015  . Midepigastric pain 03/22/2015  . Cough  01/07/2015  . Allergic rhinitis 12/13/2014  . Acute bronchitis 12/13/2014  . Blood in stool 10/01/2014  . Diabetes mellitus type II, uncontrolled ([place]) 08/13/2014  . Abnormal ECG 05/14/2012  . Chest pain 10/23/2011  . ASCUS on Pap smear 10/08/2011  . H/O thyroid nodule 08/27/2011  . Depression with anxiety 08/27/2011  .  Heavy periods 08/27/2011      Laureen Abrahams, PT, DPT 08/30/16 10:57 AM    [facility] [place], [place], [address] Phone: [phone]   Fax:  [phone]  Name: HIKARI TRIPP MRN: [id number] Date of Birth: June 30, 1965

## 2016-08-31 ENCOUNTER — Other Ambulatory Visit: Payer: Self-pay | Admitting: Family Medicine

## 2016-08-31 ENCOUNTER — Telehealth: Payer: Self-pay | Admitting: Family Medicine

## 2016-08-31 DIAGNOSIS — N289 Disorder of kidney and ureter, unspecified: Secondary | ICD-10-CM

## 2016-08-31 NOTE — Telephone Encounter (Signed)
Pt says that she and her provider discussed a Urology referral. She would like to check into that. Pt says that she would like to go to a [place] location if possible.

## 2016-08-31 NOTE — Telephone Encounter (Signed)
Ok for referral?

## 2016-08-31 NOTE — Telephone Encounter (Signed)
Referral done and pt. informed

## 2016-09-05 ENCOUNTER — Ambulatory Visit: Payer: Self-pay | Admitting: Otolaryngology

## 2016-09-05 ENCOUNTER — Ambulatory Visit (INDEPENDENT_AMBULATORY_CARE_PROVIDER_SITE_OTHER): Payer: BLUE CROSS/BLUE SHIELD | Admitting: Rehabilitative and Restorative Service Providers"

## 2016-09-05 ENCOUNTER — Encounter: Payer: Self-pay | Admitting: Rehabilitative and Restorative Service Providers"

## 2016-09-05 DIAGNOSIS — M546 Pain in thoracic spine: Secondary | ICD-10-CM

## 2016-09-05 DIAGNOSIS — R29898 Other symptoms and signs involving the musculoskeletal system: Secondary | ICD-10-CM

## 2016-09-05 NOTE — Patient Instructions (Addendum)
Scapula Adduction With Pectoralis Stretch: Low - Standing   Shoulders at 45 hands even with shoulders, keeping weight through legs, shift weight forward until you feel pull or stretch through the front of your chest. Hold _30__ seconds. Do _3__ times, _2-4__ times per day.   Scapula Adduction With Pectoralis Stretch: Mid-Range - Standing   Shoulders at 90 elbows even with shoulders, keeping weight through legs, shift weight forward until you feel pull or strength through the front of your chest. Hold __30_ seconds. Do _3__ times, __2-4_ times per day.   Scapula Adduction With Pectoralis Stretch: High - Standing   Shoulders at 120 hands up high on the doorway, keeping weight on feet, shift weight forward until you feel pull or stretch through the front of your chest. Hold _30__ seconds. Do _3__ times, _2-3__ times per day.  Shoulder Blade Squeeze    Rotate shoulders back, then squeeze shoulder blades down and back. Hold 10 sec Repeat __10__ times. Do _several___ sessions per day. Can use swim noodle along back   Knee to Chest bilat     Lying supine, bend involved knee to chest _3-5__ times. Repeat with other leg. Hold 20 sec Do __2_ times per day.     Self massage using a 4 inch plastic ball

## 2016-09-05 NOTE — H&P (Signed)
Felicia Solis is an 51 y.o. female.   Chief Complaint: Thyroid nodule HPI: Left thyroid nodule increasing in size over the past couple of years. Right side removed about 5 years ago for benign disease.  Past Medical History:  Diagnosis Date  . Anemia    anemia  when pregnant.  10 yrs ago.   . Angina    pt states she has occasional chest pains no other sxs.. seen in ed  at  [facility] .. all tsts negative.   Marland Kitchen Anxiety   . Asthma 5 yrs ago    told she had asthma.. does not remember doctors name.   . Depression    takes citalopram  . Diabetes ([place]) 2016  . Headache(784.0)    years ago told h/a s were migraines.   . Hyperlipidemia   . Renal disorder    kidney stones  . Shortness of breath    with exertion  . Thyroid disease   . Tuberculosis    pt states she took 9 months of medicine for positive tb skin test.     Past Surgical History:  Procedure Laterality Date  . DILATION AND CURETTAGE OF UTERUS  1999  . ENDOMETRIAL ABLATION    . LAPAROSCOPY     years ago ..pt does not know where surgery was done ... states" my stomach would blow up then go down"  . THYROIDECTOMY  09/28/2011   Procedure: THYROIDECTOMY;  Surgeon: Izora Gala, MD;  Location: [facility];  Service: ENT;  Laterality: Right;  RIGHT THYROIDECTOMY WITH FROZEN SECTION  . TUBAL LIGATION      Family History  Problem Relation Age of Onset  . Diabetes Mother   . Hypertension Mother   . Hyperlipidemia Mother   . Gallbladder disease Mother   . Heart disease Mother   . Diabetes Sister   . Hypertension Father   . Hyperlipidemia Father   . Colon polyps Father   . Ovarian cancer Maternal Grandmother   . Gallbladder disease Maternal Grandmother   . Colon cancer Paternal Grandfather   . Breast cancer Maternal Aunt   . Heart disease Other     early cad  . Diabetes Brother   . Heart attack Brother   . Anesthesia problems Neg Hx   . Hypotension Neg Hx   . Malignant hyperthermia Neg Hx   . Pseudochol deficiency Neg Hx    . Esophageal cancer Neg Hx    Social History:  reports that she has never smoked. She has never used smokeless tobacco. She reports that she does not drink alcohol or use drugs.  Allergies:  Allergies  Allergen Reactions  . Lisinopril Cough     (Not in a hospital admission)  No results found for this or any previous visit (from the past 48 hour(s)). No results found.  ROS: otherwise negative  There were no vitals taken for this visit.  PHYSICAL EXAM: Overall appearance:  Healthy appearing, in no distress Head:  Normocephalic, atraumatic. Ears: External auditory canals are clear; tympanic membranes are intact and the middle ears are free of any effusion. Nose: External nose is healthy in appearance. Internal nasal exam free of any lesions or obstruction. Oral Cavity/pharynx:  There are no mucosal lesions or masses identified. Hypopharynx/Larynx: no signs of any mucosal lesions or masses identified. Vocal cords move normally. Neuro:  No identifiable neurologic deficits. Neck: Left thyroid nodule palpable.  Studies Reviewed: none    Assessment/Plan Proceed with completion thyroidectomy.  Felicia Solis 09/05/2016, 12:16  PM

## 2016-09-05 NOTE — Therapy (Signed)
[place], [place], [address] Phone: [phone]   Fax:  [phone]  Physical Therapy Treatment  Patient Details  Name: Felicia Solis MRN: [id number] Date of Birth: 1965/09/22 Referring Provider: Dr Karlton Lemon   Encounter Date: 09/05/2016      PT End of Session - 09/05/16 0731    Visit Number 3   Number of Visits 12   Date for PT Re-Evaluation 10/09/16   PT Start Time 0718   PT Stop Time 0809   PT Time Calculation (min) 51 min   Activity Tolerance Patient tolerated treatment well      Past Medical History:  Diagnosis Date  . Anemia    anemia  when pregnant.  10 yrs ago.   . Angina    pt states she has occasional chest pains no other sxs.. seen in ed  at  [facility] .. all tsts negative.   Marland Kitchen Anxiety   . Asthma 5 yrs ago    told she had asthma.. does not remember doctors name.   . Depression    takes citalopram  . Diabetes ([place]) 2016  . Headache(784.0)    years ago told h/a s were migraines.   . Hyperlipidemia   . Renal disorder    kidney stones  . Shortness of breath    with exertion  . Thyroid disease   . Tuberculosis    pt states she took 9 months of medicine for positive tb skin test.     Past Surgical History:  Procedure Laterality Date  . DILATION AND CURETTAGE OF UTERUS  1999  . ENDOMETRIAL ABLATION    . LAPAROSCOPY     years ago ..pt does not know where surgery was done ... states" my stomach would blow up then go down"  . THYROIDECTOMY  09/28/2011   Procedure: THYROIDECTOMY;  Surgeon: Izora Gala, MD;  Location: [facility];  Service: ENT;  Laterality: Right;  RIGHT THYROIDECTOMY WITH FROZEN SECTION  . TUBAL LIGATION      There were no vitals filed for this visit.      Subjective Assessment - 09/05/16 0725    Subjective Patient reports that she is improving but now feeling the pain in the Lt lower ribs in the back now. This may be due to the way she is getting up from  lying down or sitting pushing with her Lt arm.    Currently in Pain? Yes   Pain Score 4    Pain Location Back   Pain Orientation Mid;Lower;Left   Pain Descriptors / Indicators Dull;Aching   Pain Type Acute pain   Pain Onset More than a month ago   Pain Frequency Intermittent                         [facility] Adult PT Treatment/Exercise - 09/05/16 0001      Shoulder Exercises: Pulleys   Flexion --  10 sec x 10      Shoulder Exercises: Stretch   Corner Stretch --  3 way doorway 30 sec x 2 each position    Other Shoulder Stretches seated upper trap, post shoulder rolls and trunk rotation   Other Shoulder Stretches double knee to chest 20 sec x 3; lat stretch supine 20 sec x 2 PT assist required no t assigned for home      Moist Heat Therapy   Number Minutes Moist Heat 20 Minutes   Moist Heat  Location --  thoracic spine     Electrical Stimulation   Electrical Stimulation Location R thoracic paraspinals   Electrical Stimulation Action pre mod    Electrical Stimulation Parameters to tolerance   Electrical Stimulation Goals Pain;Tone     Manual Therapy   Manual Therapy Soft tissue mobilization   Manual therapy comments R thoracic spine along latissimus and paraspinals in prone                PT Education - 09/05/16 0742    Education provided Yes   Education Details HEP   Person(s) Educated Patient   Methods Explanation;Demonstration;Tactile cues;Verbal cues;Handout   Comprehension Verbalized understanding;Returned demonstration;Verbal cues required;Tactile cues required             PT Long Term Goals - 09/05/16 0727      PT LONG TERM GOAL #1   Title Improve posture and alignment with patient to demonstrated improved thoracic extension 10/09/16   Time 6   Period Weeks   Status On-going     PT LONG TERM GOAL #2   Title Patient to demonstrated normal strength bilat UE's without pain with resistive testing 10/09/16   Time 6   Period Weeks    Status On-going     PT LONG TERM GOAL #3   Title Patient to report 75-90% decrease in pain through the thoracic spine 10/09/16   Time 6   Period Weeks   Status On-going     PT LONG TERM GOAL #4   Title Independent in HEP 10/09/16   Time 6   Period Weeks   Status On-going     PT LONG TERM GOAL #5   Title Improve FOTO to </= 33% limitation 10/09/16   Time 6   Period Weeks   Status On-going               Plan - 09/05/16 0743    Clinical Impression Statement Gradual progress with improved posture and alignment; decreased pain and increased exercise tolerance. Progressing well toward stated goals of therapy.    Rehab Potential Good   PT Frequency 2x / week   PT Duration 6 weeks   PT Treatment/Interventions Patient/family education;Neuromuscular re-education;ADLs/Self Care Home Management;Cryotherapy;Electrical Stimulation;Iontophoresis 4mg /ml Dexamethasone;Moist Heat;Ultrasound;Dry needling;Manual techniques;Therapeutic activities;Therapeutic exercise   PT Next Visit Plan Continue with gentle stretching and mobility exercise; add posterior girdle strengthening; progress to thoracic mobs as indicated and tolerated; manual work; soft tissue mobilization; modalities. Add lat stretch in supine    Consulted and Agree with Plan of Care Patient      Patient will benefit from skilled therapeutic intervention in order to improve the following deficits and impairments:  Postural dysfunction, Improper body mechanics, Pain, Decreased range of motion, Decreased mobility, Decreased strength, Increased fascial restricitons, Increased muscle spasms, Decreased activity tolerance  Visit Diagnosis: Pain in thoracic spine  Other symptoms and signs involving the musculoskeletal system     Problem List Patient Active Problem List   Diagnosis Date Noted  . Memory loss 08/23/2016  . Thyroid nodule 08/02/2016  . Plantar fasciitis of right foot 12/01/2015  . Knee pain, right 12/01/2015  .  Headache, migraine 09/06/2015  . Bilateral knee pain 08/22/2015  . Thoracic myofascial strain 08/22/2015  . Dysphagia 04/10/2015  . Atypical chest pain 04/10/2015  . Epigastric pain 04/10/2015  . Functional diarrhea 04/10/2015  . NSAID long-term use 04/10/2015  . Midepigastric pain 03/22/2015  . Cough 01/07/2015  . Allergic rhinitis 12/13/2014  . Acute bronchitis 12/13/2014  .  Blood in stool 10/01/2014  . Diabetes mellitus type II, uncontrolled ([place]) 08/13/2014  . Abnormal ECG 05/14/2012  . Chest pain 10/23/2011  . ASCUS on Pap smear 10/08/2011  . H/O thyroid nodule 08/27/2011  . Depression with anxiety 08/27/2011  . Heavy periods 08/27/2011    Janalyn Higby Nilda Simmer PT, MPH  09/05/2016, 8:09 AM  [facility] [place], [place], [address] Phone: [phone]   Fax:  [phone]  Name: MADELLYN DENIO MRN: [id number] Date of Birth: [birth year]

## 2016-09-07 ENCOUNTER — Encounter: Payer: Self-pay | Admitting: Rehabilitative and Restorative Service Providers"

## 2016-09-07 ENCOUNTER — Ambulatory Visit (INDEPENDENT_AMBULATORY_CARE_PROVIDER_SITE_OTHER): Payer: BLUE CROSS/BLUE SHIELD | Admitting: Rehabilitative and Restorative Service Providers"

## 2016-09-07 DIAGNOSIS — R29898 Other symptoms and signs involving the musculoskeletal system: Secondary | ICD-10-CM

## 2016-09-07 DIAGNOSIS — M546 Pain in thoracic spine: Secondary | ICD-10-CM

## 2016-09-07 NOTE — Patient Instructions (Addendum)
Resisted External Rotation: in Neutral - Bilateral   PALMS UP Sit or stand, tubing in both hands, elbows at sides, bent to 90, forearms forward. Pinch shoulder blades together and rotate forearms out. Keep elbows at sides. Repeat __10__ times per set. Do _2-3___ sets per session. Do _2-3___ sessions per day.   Low Row: Standing   Face anchor, feet shoulder width apart. Palms up, pull arms back, squeezing shoulder blades together. Repeat 10__ times per set. Do 2-3__ sets per session. Do 2-3__ sessions per week. Anchor Height: Waist   Strengthening: Resisted Extension   Hold tubing in right hand, arm forward. Pull arm back, elbow straight. Repeat _10___ times per set. Do 2-3____ sets per session. Do 2-3____ sessions per day.   Sitting    Sit in chair with knees spread apart. Bend forward toward floor. Comfortable stretch should be felt in lower back. Hold _20-30__ seconds. Repeat __3-5_ times per session. Do __2-3_ sessions per day.   Trigger Point Dry Needling  . What is Trigger Point Dry Needling (DN)? o DN is a physical therapy technique used to treat muscle pain and dysfunction. Specifically, DN helps deactivate muscle trigger points (muscle knots).  o A thin filiform needle is used to penetrate the skin and stimulate the underlying trigger point. The goal is for a local twitch response (LTR) to occur and for the trigger point to relax. No medication of any kind is injected during the procedure.   . What Does Trigger Point Dry Needling Feel Like?  o The procedure feels different for each individual patient. Some patients report that they do not actually feel the needle enter the skin and overall the process is not painful. Very mild bleeding may occur. However, many patients feel a deep cramping in the muscle in which the needle was inserted. This is the local twitch response.   Marland Kitchen How Will I feel after the treatment? o Soreness is normal, and the onset of soreness may not  occur for a few hours. Typically this soreness does not last longer than two days.  o Bruising is uncommon, however; ice can be used to decrease any possible bruising.  o In rare cases feeling tired or nauseous after the treatment is normal. In addition, your symptoms may get worse before they get better, this period will typically not last longer than 24 hours.   . What Can I do After My Treatment? o Increase your hydration by drinking more water for the next 24 hours. o You may place ice or heat on the areas treated that have become sore, however, do not use heat on inflamed or bruised areas. Heat often brings more relief post needling. o You can continue your regular activities, but vigorous activity is not recommended initially after the treatment for 24 hours. o DN is best combined with other physical therapy such as strengthening, stretching, and other therapies.

## 2016-09-07 NOTE — Therapy (Signed)
[place], [place], [address] Phone: [phone]   Fax:  [phone]  Physical Therapy Treatment  Patient Details  Name: Felicia Solis MRN: [id number] Date of Birth: [birth year] Referring Provider: Dr Karlton Lemon   Encounter Date: 09/07/2016      PT End of Session - 09/07/16 0938    Visit Number 4   Number of Visits 12   Date for PT Re-Evaluation 10/09/16   PT Start Time 0938   PT Stop Time 1034   PT Time Calculation (min) 56 min   Activity Tolerance Patient tolerated treatment well      Past Medical History:  Diagnosis Date  . Anemia    anemia  when pregnant.  10 yrs ago.   . Angina    pt states she has occasional chest pains no other sxs.. seen in ed  at  [facility] .. all tsts negative.   Marland Kitchen Anxiety   . Asthma 5 yrs ago    told she had asthma.. does not remember doctors name.   . Depression    takes citalopram  . Diabetes ([place]) 2016  . Headache(784.0)    years ago told h/a s were migraines.   . Hyperlipidemia   . Renal disorder    kidney stones  . Shortness of breath    with exertion  . Thyroid disease   . Tuberculosis    pt states she took 9 months of medicine for positive tb skin test.     Past Surgical History:  Procedure Laterality Date  . DILATION AND CURETTAGE OF UTERUS  1999  . ENDOMETRIAL ABLATION    . LAPAROSCOPY     years ago ..pt does not know where surgery was done ... states" my stomach would blow up then go down"  . THYROIDECTOMY  09/28/2011   Procedure: THYROIDECTOMY;  Surgeon: Izora Gala, MD;  Location: [facility];  Service: ENT;  Laterality: Right;  RIGHT THYROIDECTOMY WITH FROZEN SECTION  . TUBAL LIGATION      There were no vitals filed for this visit.      Subjective Assessment - 09/07/16 0941    Subjective Some continued pain at times. Feels she may need a new mattress. Pain continues in the Lt lower ribs and back but the main pain in middle back between  her shoulder blades.    Currently in Pain? No/denies                         [facility] Adult PT Treatment/Exercise - 09/07/16 0001      Self-Care   Self-Care --  suggested plywood btn mattress and springs for firmer bed     Shoulder Exercises: Supine   Other Supine Exercises lying supine over 3 in noodle along and across spine 2-3 min each      Shoulder Exercises: Seated   Other Seated Exercises rolling head and shoulders forward for stretch through thoracic spine      Shoulder Exercises: Standing   Extension Strengthening;Both;20 reps;Theraband   Theraband Level (Shoulder Extension) Level 2 (Red)   Row Strengthening;Both;20 reps;Theraband   Theraband Level (Shoulder Row) Level 2 (Red)   Retraction Strengthening;Both;20 reps;Theraband   Theraband Level (Shoulder Retraction) Level 1 (Yellow)   Other Standing Exercises scap squeeze 10 sec x 10      Shoulder Exercises: ROM/Strengthening   UBE (Upper Arm Bike) L2 x 4 min alt fwd/back      Shoulder  Exercises: IT sales professional --  3 way doorway 30 sec x 2 each position    Other Shoulder Stretches seated upper trap, post shoulder rolls and trunk rotation   Other Shoulder Stretches double knee to chest 20 sec x 3; lat stretch supine 20 sec x 2 PT assist required no t assigned for home      Moist Heat Therapy   Number Minutes Moist Heat 20 Minutes   Moist Heat Location --  thoracic spine     Electrical Stimulation   Electrical Stimulation Location Lt thoracic paraspinals   Electrical Stimulation Action IFC   Electrical Stimulation Parameters to tolerance   Electrical Stimulation Goals Pain;Tone     Manual Therapy   Manual Therapy Soft tissue mobilization   Manual therapy comments pt prone    Joint Mobilization mid thoracic CPA mobs Grade II/III   Soft tissue mobilization thoracic and lumbar musculature inc paraspinals; lats; traps   Myofascial Release Lt thoracolumbar                 PT  Education - 09/07/16 0955    Education provided Yes   Education Details HEP    Person(s) Educated Patient   Methods Explanation;Demonstration;Tactile cues;Verbal cues;Handout   Comprehension Verbalized understanding;Returned demonstration;Verbal cues required;Tactile cues required             PT Long Term Goals - 09/05/16 0727      PT LONG TERM GOAL #1   Title Improve posture and alignment with patient to demonstrated improved thoracic extension 10/09/16   Time 6   Period Weeks   Status On-going     PT LONG TERM GOAL #2   Title Patient to demonstrated normal strength bilat UE's without pain with resistive testing 10/09/16   Time 6   Period Weeks   Status On-going     PT LONG TERM GOAL #3   Title Patient to report 75-90% decrease in pain through the thoracic spine 10/09/16   Time 6   Period Weeks   Status On-going     PT LONG TERM GOAL #4   Title Independent in HEP 10/09/16   Time 6   Period Weeks   Status On-going     PT LONG TERM GOAL #5   Title Improve FOTO to </= 33% limitation 10/09/16   Time 6   Period Weeks   Status On-going               Plan - 09/07/16 0947    Clinical Impression Statement Added exxercises without difficulty. Note improved movement and improved quality of movement UE's. She has less tenderness and tightness to palpation. Continues to progress toward stated goals of therapy.    Rehab Potential Good   PT Frequency 2x / week   PT Duration 6 weeks   PT Treatment/Interventions Patient/family education;Neuromuscular re-education;ADLs/Self Care Home Management;Cryotherapy;Electrical Stimulation;Iontophoresis 4mg /ml Dexamethasone;Moist Heat;Ultrasound;Dry needling;Manual techniques;Therapeutic activities;Therapeutic exercise   PT Next Visit Plan Continue with gentle stretching and mobility exercise; progress posterior girdle strengthening; progress to thoracic mobs as indicated and tolerated; manual work; soft tissue mobilization; modalities.     Consulted and Agree with Plan of Care Patient      Patient will benefit from skilled therapeutic intervention in order to improve the following deficits and impairments:  Postural dysfunction, Improper body mechanics, Pain, Decreased range of motion, Decreased mobility, Decreased strength, Increased fascial restricitons, Increased muscle spasms, Decreased activity tolerance  Visit Diagnosis: Pain in thoracic spine  Other symptoms and  signs involving the musculoskeletal system     Problem List Patient Active Problem List   Diagnosis Date Noted  . Memory loss 08/23/2016  . Thyroid nodule 08/02/2016  . Plantar fasciitis of right foot 12/01/2015  . Knee pain, right 12/01/2015  . Headache, migraine 09/06/2015  . Bilateral knee pain 08/22/2015  . Thoracic myofascial strain 08/22/2015  . Dysphagia 04/10/2015  . Atypical chest pain 04/10/2015  . Epigastric pain 04/10/2015  . Functional diarrhea 04/10/2015  . NSAID long-term use 04/10/2015  . Midepigastric pain 03/22/2015  . Cough 01/07/2015  . Allergic rhinitis 12/13/2014  . Acute bronchitis 12/13/2014  . Blood in stool 10/01/2014  . Diabetes mellitus type II, uncontrolled ([place]) 08/13/2014  . Abnormal ECG 05/14/2012  . Chest pain 10/23/2011  . ASCUS on Pap smear 10/08/2011  . H/O thyroid nodule 08/27/2011  . Depression with anxiety 08/27/2011  . Heavy periods 08/27/2011    Felicia Solis Nilda Simmer PT, MPH  09/07/2016, 10:29 AM  [facility] [place], [place], [address] Phone: [phone]   Fax:  [phone]  Name: Felicia Solis MRN: [id number] Date of Birth: 02/05/1966

## 2016-09-10 ENCOUNTER — Ambulatory Visit (INDEPENDENT_AMBULATORY_CARE_PROVIDER_SITE_OTHER): Payer: BLUE CROSS/BLUE SHIELD | Admitting: Physical Therapy

## 2016-09-10 DIAGNOSIS — M546 Pain in thoracic spine: Secondary | ICD-10-CM | POA: Diagnosis not present

## 2016-09-10 DIAGNOSIS — R29898 Other symptoms and signs involving the musculoskeletal system: Secondary | ICD-10-CM | POA: Diagnosis not present

## 2016-09-10 NOTE — Therapy (Signed)
[place], [place], [address] Phone: [phone]   Fax:  [phone]  Physical Therapy Treatment  Patient Details  Name: Felicia Solis MRN: [id number] Date of Birth: 09/01/1965 Referring Provider: Dr Karlton Lemon   Encounter Date: 09/10/2016      PT End of Session - 09/10/16 1510    Visit Number 5   Number of Visits 12   Date for PT Re-Evaluation 10/09/16   PT Start Time 1430   PT Stop Time 1522   PT Time Calculation (min) 52 min   Activity Tolerance Patient tolerated treatment well   Behavior During Therapy [facility] for tasks assessed/performed      Past Medical History:  Diagnosis Date  . Anemia    anemia  when pregnant.  10 yrs ago.   . Angina    pt states she has occasional chest pains no other sxs.. seen in ed  at  [facility] .. all tsts negative.   Marland Kitchen Anxiety   . Asthma 5 yrs ago    told she had asthma.. does not remember doctors name.   . Depression    takes citalopram  . Diabetes ([place]) 2016  . Headache(784.0)    years ago told h/a s were migraines.   . Hyperlipidemia   . Renal disorder    kidney stones  . Shortness of breath    with exertion  . Thyroid disease   . Tuberculosis    pt states she took 9 months of medicine for positive tb skin test.     Past Surgical History:  Procedure Laterality Date  . DILATION AND CURETTAGE OF UTERUS  1999  . ENDOMETRIAL ABLATION    . LAPAROSCOPY     years ago ..pt does not know where surgery was done ... states" my stomach would blow up then go down"  . THYROIDECTOMY  09/28/2011   Procedure: THYROIDECTOMY;  Surgeon: Izora Gala, MD;  Location: [facility];  Service: ENT;  Laterality: Right;  RIGHT THYROIDECTOMY WITH FROZEN SECTION  . TUBAL LIGATION      There were no vitals filed for this visit.      Subjective Assessment - 09/10/16 1430    Subjective doing "okay."  feels her back is getting a little better.   Pertinent History AODM;  denies any musculoskeletal injury; lumbar strain 2003; arthritic changes by xray    Patient Stated Goals get rid of pain    Currently in Pain? No/denies                         [facility] Adult PT Treatment/Exercise - 09/10/16 1433      Exercises   Exercises Shoulder;Lumbar     Lumbar Exercises: Stretches   Quadruped Mid Back Stretch Limitations seated stretch with green physioball 5x20 sec (mid, Lt, Rt )     Lumbar Exercises: Aerobic   Stationary Bike NuStep L5 x 6 min     Lumbar Exercises: Prone   Opposite Arm/Leg Raise Right arm/Left leg;Left arm/Right leg;10 reps     Shoulder Exercises: ROM/Strengthening   "W" Arms prone with axial extension 2x10     Moist Heat Therapy   Number Minutes Moist Heat 15 Minutes   Moist Heat Location --  thoracic spine     Electrical Stimulation   Electrical Stimulation Location thoracic paraspinals   Electrical Stimulation Action pre mod   Electrical Stimulation Parameters to tolerance     Manual  Therapy   Manual Therapy Soft tissue mobilization   Manual therapy comments pt prone    Soft tissue mobilization thoracic and lumbar musculature inc paraspinals; lats; traps   Myofascial Release Lt thoracolumbar                      PT Long Term Goals - 09/05/16 0727      PT LONG TERM GOAL #1   Title Improve posture and alignment with patient to demonstrated improved thoracic extension 10/09/16   Time 6   Period Weeks   Status On-going     PT LONG TERM GOAL #2   Title Patient to demonstrated normal strength bilat UE's without pain with resistive testing 10/09/16   Time 6   Period Weeks   Status On-going     PT LONG TERM GOAL #3   Title Patient to report 75-90% decrease in pain through the thoracic spine 10/09/16   Time 6   Period Weeks   Status On-going     PT LONG TERM GOAL #4   Title Independent in HEP 10/09/16   Time 6   Period Weeks   Status On-going     PT LONG TERM GOAL #5   Title Improve FOTO to  </= 33% limitation 10/09/16   Time 6   Period Weeks   Status On-going               Plan - 09/10/16 1511    Clinical Impression Statement Pt continues to have tenderness and tightness to palpation, and feel she would benefit from TDN.  Tolerated exercises well today with only reports of fatigue, no pain.  Continuing to progress towards goals.   PT Treatment/Interventions Patient/family education;Neuromuscular re-education;ADLs/Self Care Home Management;Cryotherapy;Electrical Stimulation;Iontophoresis 4mg /ml Dexamethasone;Moist Heat;Ultrasound;Dry needling;Manual techniques;Therapeutic activities;Therapeutic exercise   PT Next Visit Plan Continue with gentle stretching and mobility exercise; progress posterior girdle strengthening; progress to thoracic mobs as indicated and tolerated; manual work; soft tissue mobilization; modalities, TDN as indicated    Consulted and Agree with Plan of Care Patient      Patient will benefit from skilled therapeutic intervention in order to improve the following deficits and impairments:  Postural dysfunction, Improper body mechanics, Pain, Decreased range of motion, Decreased mobility, Decreased strength, Increased fascial restricitons, Increased muscle spasms, Decreased activity tolerance  Visit Diagnosis: Pain in thoracic spine  Other symptoms and signs involving the musculoskeletal system     Problem List Patient Active Problem List   Diagnosis Date Noted  . Memory loss 08/23/2016  . Thyroid nodule 08/02/2016  . Plantar fasciitis of right foot 12/01/2015  . Knee pain, right 12/01/2015  . Headache, migraine 09/06/2015  . Bilateral knee pain 08/22/2015  . Thoracic myofascial strain 08/22/2015  . Dysphagia 04/10/2015  . Atypical chest pain 04/10/2015  . Epigastric pain 04/10/2015  . Functional diarrhea 04/10/2015  . NSAID long-term use 04/10/2015  . Midepigastric pain 03/22/2015  . Cough 01/07/2015  . Allergic rhinitis 12/13/2014  .  Acute bronchitis 12/13/2014  . Blood in stool 10/01/2014  . Diabetes mellitus type II, uncontrolled ([place]) 08/13/2014  . Abnormal ECG 05/14/2012  . Chest pain 10/23/2011  . ASCUS on Pap smear 10/08/2011  . H/O thyroid nodule 08/27/2011  . Depression with anxiety 08/27/2011  . Heavy periods 08/27/2011      Laureen Abrahams, PT, DPT 09/10/16 3:13 PM    [facility] [place], [place], [address] Phone: [phone]  Fax:  [phone]  Name: AMBRA HAVERSTICK MRN: [id number] Date of Birth: 1965/10/26

## 2016-09-13 ENCOUNTER — Ambulatory Visit (INDEPENDENT_AMBULATORY_CARE_PROVIDER_SITE_OTHER): Payer: BLUE CROSS/BLUE SHIELD | Admitting: Physical Therapy

## 2016-09-13 DIAGNOSIS — M546 Pain in thoracic spine: Secondary | ICD-10-CM

## 2016-09-13 DIAGNOSIS — R29898 Other symptoms and signs involving the musculoskeletal system: Secondary | ICD-10-CM

## 2016-09-13 NOTE — Therapy (Signed)
[name], [place], [address] Phone: [phone]   Fax:  [phone]  Physical Therapy Treatment  Patient Details  Name: Felicia Solis MRN: [id number] Date of Birth: June 15, 1966 Referring Provider: Dr Karlton Lemon   Encounter Date: 09/13/2016      PT End of Session - 09/13/16 1104    Visit Number 6   Number of Visits 12   Date for PT Re-Evaluation 10/09/16   PT Start Time 1104   PT Stop Time 1205   PT Time Calculation (min) 61 min      Past Medical History:  Diagnosis Date  . Anemia    anemia  when pregnant.  10 yrs ago.   . Angina    pt states she has occasional chest pains no other sxs.. seen in ed  at  [facility] .. all tsts negative.   Marland Kitchen Anxiety   . Asthma 5 yrs ago    told she had asthma.. does not remember doctors name.   . Depression    takes citalopram  . Diabetes ([place]) 2016  . Headache(784.0)    years ago told h/a s were migraines.   . Hyperlipidemia   . Renal disorder    kidney stones  . Shortness of breath    with exertion  . Thyroid disease   . Tuberculosis    pt states she took 9 months of medicine for positive tb skin test.     Past Surgical History:  Procedure Laterality Date  . DILATION AND CURETTAGE OF UTERUS  1999  . ENDOMETRIAL ABLATION    . LAPAROSCOPY     years ago ..pt does not know where surgery was done ... states" my stomach would blow up then go down"  . THYROIDECTOMY  09/28/2011   Procedure: THYROIDECTOMY;  Surgeon: Izora Gala, MD;  Location: [facility];  Service: ENT;  Laterality: Right;  RIGHT THYROIDECTOMY WITH FROZEN SECTION  . TUBAL LIGATION      There were no vitals filed for this visit.      Subjective Assessment - 09/13/16 1106    Subjective Pt feels like she is getting slowly better, not excited about DN and wishes to try other things first.    Patient Stated Goals get rid of pain    Currently in Pain? Yes   Pain Score 2    Pain Location  Thoracic   Pain Orientation Mid   Pain Descriptors / Indicators Aching;Dull   Pain Type Acute pain   Pain Onset More than a month ago   Pain Frequency Intermittent   Aggravating Factors  bending forward, lifting with arms   Pain Relieving Factors PRN meds, heat                         [facility] Adult PT Treatment/Exercise - 09/13/16 0001      Shoulder Exercises: Supine   Other Supine Exercises 2x10 on bolster, red band overhead pull, horizontal abduct, ER.      Shoulder Exercises: Sidelying   Other Sidelying Exercises 2x10 upper trunk rotations using red band each side     Shoulder Exercises: ROM/Strengthening   UBE (Upper Arm Bike) L2 x 4 min alt fwd/back      Modalities   Modalities Electrical Stimulation;Moist Heat;Ultrasound     Moist Heat Therapy   Number Minutes Moist Heat 15 Minutes   Moist Heat Location --  thoracic     Electrical Stimulation  Electrical Stimulation Location thoracic paraspinals   Electrical Stimulation Action IFC   Electrical Stimulation Parameters  to tolerance   Electrical Stimulation Goals Pain;Tone     Ultrasound   Ultrasound Location bilat thoracic paraspinals    Ultrasound Parameters combo us/stim, 100%, 1.55mHz, 1.5w/cm2   Ultrasound Goals Pain  tone     Manual Therapy   Manual Therapy Soft tissue mobilization   Manual therapy comments pt prone    Soft tissue mobilization thoracic paraspinals                      PT Long Term Goals - 09/05/16 0727      PT LONG TERM GOAL #1   Title Improve posture and alignment with patient to demonstrated improved thoracic extension 10/09/16   Time 6   Period Weeks   Status On-going     PT LONG TERM GOAL #2   Title Patient to demonstrated normal strength bilat UE's without pain with resistive testing 10/09/16   Time 6   Period Weeks   Status On-going     PT LONG TERM GOAL #3   Title Patient to report 75-90% decrease in pain through the thoracic spine 10/09/16    Time 6   Period Weeks   Status On-going     PT LONG TERM GOAL #4   Title Independent in HEP 10/09/16   Time 6   Period Weeks   Status On-going     PT LONG TERM GOAL #5   Title Improve FOTO to </= 33% limitation 10/09/16   Time 6   Period Weeks   Status On-going               Plan - 09/13/16 1233    Clinical Impression Statement Felicia Solis has bands of muscle spasms in her lower thoracic paraspinals, she is hesitant to try DN at this time.  Added in combo US/stim to her treatment and we will see if this will help loosen up her muscles.    Rehab Potential Good   PT Frequency 2x / week   PT Duration 6 weeks   PT Treatment/Interventions Patient/family education;Neuromuscular re-education;ADLs/Self Care Home Management;Cryotherapy;Electrical Stimulation;Iontophoresis 4mg /ml Dexamethasone;Moist Heat;Ultrasound;Dry needling;Manual techniques;Therapeutic activities;Therapeutic exercise   PT Next Visit Plan assess response to combo   Consulted and Agree with Plan of Care Patient      Patient will benefit from skilled therapeutic intervention in order to improve the following deficits and impairments:  Postural dysfunction, Improper body mechanics, Pain, Decreased range of motion, Decreased mobility, Decreased strength, Increased fascial restricitons, Increased muscle spasms, Decreased activity tolerance  Visit Diagnosis: Pain in thoracic spine  Other symptoms and signs involving the musculoskeletal system     Problem List Patient Active Problem List   Diagnosis Date Noted  . Memory loss 08/23/2016  . Thyroid nodule 08/02/2016  . Plantar fasciitis of right foot 12/01/2015  . Knee pain, right 12/01/2015  . Headache, migraine 09/06/2015  . Bilateral knee pain 08/22/2015  . Thoracic myofascial strain 08/22/2015  . Dysphagia 04/10/2015  . Atypical chest pain 04/10/2015  . Epigastric pain 04/10/2015  . Functional diarrhea 04/10/2015  . NSAID long-term use 04/10/2015  .  Midepigastric pain 03/22/2015  . Cough 01/07/2015  . Allergic rhinitis 12/13/2014  . Acute bronchitis 12/13/2014  . Blood in stool 10/01/2014  . Diabetes mellitus type II, uncontrolled ([place]) 08/13/2014  . Abnormal ECG 05/14/2012  . Chest pain 10/23/2011  . ASCUS on Pap smear 10/08/2011  . H/O thyroid  nodule 08/27/2011  . Depression with anxiety 08/27/2011  . Heavy periods 08/27/2011    Jeral Pinch PT  09/13/2016, 12:37 PM  [facility] [place], [place], [address] Phone: [phone]   Fax:  [phone]  Name: Felicia Solis MRN: [id number] Date of Birth: Nov 17, 1965

## 2016-09-17 ENCOUNTER — Ambulatory Visit (INDEPENDENT_AMBULATORY_CARE_PROVIDER_SITE_OTHER): Payer: BLUE CROSS/BLUE SHIELD | Admitting: Physical Therapy

## 2016-09-17 DIAGNOSIS — R29898 Other symptoms and signs involving the musculoskeletal system: Secondary | ICD-10-CM

## 2016-09-17 DIAGNOSIS — M546 Pain in thoracic spine: Secondary | ICD-10-CM | POA: Diagnosis not present

## 2016-09-17 NOTE — Therapy (Signed)
[place], [place], [address] Phone: [phone]   Fax:  [phone]  Physical Therapy Treatment  Patient Details  Name: Felicia Solis MRN: [id number] Date of Birth: 07/22/1965 Referring Provider: Dr. Karlton Lemon   Encounter Date: 09/17/2016      PT End of Session - 09/17/16 1437    Visit Number 7   Number of Visits 12   Date for PT Re-Evaluation 10/09/16   PT Start Time 9326   PT Stop Time 1452   PT Time Calculation (min) 49 min   Activity Tolerance Patient tolerated treatment well      Past Medical History:  Diagnosis Date  . Anemia    anemia  when pregnant.  10 yrs ago.   . Angina    pt states she has occasional chest pains no other sxs.. seen in ed  at  [facility] .. all tsts negative.   Marland Kitchen Anxiety   . Asthma 5 yrs ago    told she had asthma.. does not remember doctors name.   . Depression    takes citalopram  . Diabetes ([place]) 2016  . Headache(784.0)    years ago told h/a s were migraines.   . Hyperlipidemia   . Renal disorder    kidney stones  . Shortness of breath    with exertion  . Thyroid disease   . Tuberculosis    pt states she took 9 months of medicine for positive tb skin test.     Past Surgical History:  Procedure Laterality Date  . DILATION AND CURETTAGE OF UTERUS  1999  . ENDOMETRIAL ABLATION    . LAPAROSCOPY     years ago ..pt does not know where surgery was done ... states" my stomach would blow up then go down"  . THYROIDECTOMY  09/28/2011   Procedure: THYROIDECTOMY;  Surgeon: Izora Gala, MD;  Location: [facility];  Service: ENT;  Laterality: Right;  RIGHT THYROIDECTOMY WITH FROZEN SECTION  . TUBAL LIGATION      There were no vitals filed for this visit.      Subjective Assessment - 09/17/16 1406    Subjective Pt reports she has been performing HEP 2x/day. Having difficulty placing TENS on self at home.  She feels like pain has improved 50-60%.     Currently in Pain? Yes   Pain Score 4    Pain Location Back  (thoracic)   Pain Orientation Right   Pain Descriptors / Indicators Dull   Aggravating Factors  twising, bending forward   Pain Relieving Factors heat, TENS            [facility] PT Assessment - 09/17/16 0001      Assessment   Medical Diagnosis Thoracic strain   Referring Provider Dr. Karlton Lemon    Onset Date/Surgical Date 07/04/16   Hand Dominance Right   Next MD Visit 4/18   Prior Therapy none            [facility] Adult PT Treatment/Exercise - 09/17/16 0001      Self-Care   Self-Care Other Self-Care Comments   Other Self-Care Comments  Pt educated on self massage to thoracic/lumbar back with ball; pt returned demo with VC.      Shoulder Exercises: Supine   Other Supine Exercises Sash with green band x 10 reps each arm    Other Supine Exercises thoracic ext over black bolser x 4 reps with hands supporting head  Shoulder Exercises: Seated   Other Seated Exercises thoracic ext over back of chair x 2 reps.      Shoulder Exercises: Sidelying   Other Sidelying Exercises 2x10 upper trunk rotations using red band each side     Shoulder Exercises: ROM/Strengthening   UBE (Upper Arm Bike) L2 x 3 min alt fwd/back      Moist Heat Therapy   Number Minutes Moist Heat 15 Minutes   Moist Heat Location --  thoracic     Electrical Stimulation   Electrical Stimulation Location thoracic paraspinals   Electrical Stimulation Action IFC   Electrical Stimulation Parameters to tolerance    Electrical Stimulation Goals Pain;Tone                     PT Long Term Goals - 09/17/16 1714      PT LONG TERM GOAL #1   Title Improve posture and alignment with patient to demonstrated improved thoracic extension 10/09/16   Time 6   Period Weeks   Status On-going     PT LONG TERM GOAL #2   Title Patient to demonstrated normal strength bilat UE's without pain with resistive testing 10/09/16   Time 6   Period Weeks    Status On-going     PT LONG TERM GOAL #3   Title Patient to report 75-90% decrease in pain through the thoracic spine 10/09/16   Time 6   Period Weeks   Status On-going     PT LONG TERM GOAL #4   Title Independent in HEP 10/09/16   Time 6   Period Weeks   Status On-going     PT LONG TERM GOAL #5   Title Improve FOTO to </= 33% limitation 10/09/16   Time 6   Period Weeks   Status On-going               Plan - 09/17/16 1710    Clinical Impression Statement Pt tolerated all exercises well at clinic without increase spasms in thoracic paraspinals.  She may benefit from education on body mechanics with pt transfers to prevent future injuries when returning to full duty.  Pt making gradual gains towards goals.    Rehab Potential Good   PT Frequency 2x / week   PT Duration 6 weeks   PT Treatment/Interventions Patient/family education;Neuromuscular re-education;ADLs/Self Care Home Management;Cryotherapy;Electrical Stimulation;Iontophoresis 4mg /ml Dexamethasone;Moist Heat;Ultrasound;Dry needling;Manual techniques;Therapeutic activities;Therapeutic exercise   PT Next Visit Plan Combo [place] to thoracic paraspinals, continued spinal stabilization.  Body mechanics education.    Consulted and Agree with Plan of Care Patient      Patient will benefit from skilled therapeutic intervention in order to improve the following deficits and impairments:  Postural dysfunction, Improper body mechanics, Pain, Decreased range of motion, Decreased mobility, Decreased strength, Increased fascial restricitons, Increased muscle spasms, Decreased activity tolerance  Visit Diagnosis: Pain in thoracic spine  Other symptoms and signs involving the musculoskeletal system     Problem List Patient Active Problem List   Diagnosis Date Noted  . Memory loss 08/23/2016  . Thyroid nodule 08/02/2016  . Plantar fasciitis of right foot 12/01/2015  . Knee pain, right 12/01/2015  . Headache, migraine  09/06/2015  . Bilateral knee pain 08/22/2015  . Thoracic myofascial strain 08/22/2015  . Dysphagia 04/10/2015  . Atypical chest pain 04/10/2015  . Epigastric pain 04/10/2015  . Functional diarrhea 04/10/2015  . NSAID long-term use 04/10/2015  . Midepigastric pain 03/22/2015  . Cough 01/07/2015  . Allergic rhinitis  12/13/2014  . Acute bronchitis 12/13/2014  . Blood in stool 10/01/2014  . Diabetes mellitus type II, uncontrolled ([place]) 08/13/2014  . Abnormal ECG 05/14/2012  . Chest pain 10/23/2011  . ASCUS on Pap smear 10/08/2011  . H/O thyroid nodule 08/27/2011  . Depression with anxiety 08/27/2011  . Heavy periods 08/27/2011    Kerin Perna, PTA 09/17/16 5:16 PM  [place], [place], [address] Phone: [phone]   Fax:  [phone]  Name: Felicia Solis MRN: [id number] Date of Birth: [birth year]

## 2016-09-18 DIAGNOSIS — H5213 Myopia, bilateral: Secondary | ICD-10-CM | POA: Diagnosis not present

## 2016-09-18 LAB — HM DIABETES EYE EXAM

## 2016-09-20 ENCOUNTER — Ambulatory Visit: Payer: Self-pay

## 2016-09-20 NOTE — Pre-Procedure Instructions (Signed)
Felicia Solis  09/20/2016      [place] [address] Phone: [phone] Fax: [phone]    Your procedure is scheduled on  Friday, April 6th   Report to [facility] Admitting at 7:30 AM   Call this number if you have problems the [facility] of surgery:  [phone] or [phone] Mon - Fri  8-4:30 pm   Remember:  Do not eat food or drink liquids after midnight Thursday.   Take these medicines the morning of surgery with A SIP OF WATER : Celexa, Hydrocodone, Protonix.               4-5 days prior to surgery, STOP taking any vitamins, herbal supplements, anti-inflammatories.              DO NOT take your diabetes medication the morning of surgery!    Do not wear jewelry, make-up or nail polish.  Do not wear lotions, powders, perfumes, or deoderant.   Do not shave underarms & legs 48 hours prior to surgery.    Do not bring valuables to the hospital.  [facility] is not responsible for any belongings or valuables.  Contacts, dentures or bridgework may not be worn into surgery.  Leave your suitcase in the car.  After surgery it may be brought to your room.  For patients admitted to the hospital, discharge time will be determined by your treatment team. Patients discharged the day of surgery will not be allowed to drive home.   Please read over the following fact sheets that you were given. Pain Booklet and Surgical Site Infection Prevention

## 2016-09-21 ENCOUNTER — Encounter (HOSPITAL_COMMUNITY): Payer: Self-pay

## 2016-09-21 ENCOUNTER — Encounter (HOSPITAL_COMMUNITY)
Admission: RE | Admit: 2016-09-21 | Discharge: 2016-09-21 | Disposition: A | Discharge status: Home / Self Care | Payer: BLUE CROSS/BLUE SHIELD | Source: Ambulatory Visit | Attending: Otolaryngology | Admitting: Otolaryngology

## 2016-09-21 DIAGNOSIS — Z7984 Long term (current) use of oral hypoglycemic drugs: Secondary | ICD-10-CM | POA: Diagnosis not present

## 2016-09-21 DIAGNOSIS — J45909 Unspecified asthma, uncomplicated: Secondary | ICD-10-CM | POA: Diagnosis not present

## 2016-09-21 DIAGNOSIS — E785 Hyperlipidemia, unspecified: Secondary | ICD-10-CM | POA: Diagnosis not present

## 2016-09-21 DIAGNOSIS — Z01818 Encounter for other preprocedural examination: Secondary | ICD-10-CM | POA: Diagnosis not present

## 2016-09-21 DIAGNOSIS — E041 Nontoxic single thyroid nodule: Secondary | ICD-10-CM | POA: Insufficient documentation

## 2016-09-21 DIAGNOSIS — Z8611 Personal history of tuberculosis: Secondary | ICD-10-CM | POA: Insufficient documentation

## 2016-09-21 DIAGNOSIS — Z79899 Other long term (current) drug therapy: Secondary | ICD-10-CM | POA: Diagnosis not present

## 2016-09-21 DIAGNOSIS — Z7982 Long term (current) use of aspirin: Secondary | ICD-10-CM | POA: Diagnosis not present

## 2016-09-21 DIAGNOSIS — I447 Left bundle-branch block, unspecified: Secondary | ICD-10-CM | POA: Insufficient documentation

## 2016-09-21 DIAGNOSIS — E119 Type 2 diabetes mellitus without complications: Secondary | ICD-10-CM | POA: Insufficient documentation

## 2016-09-21 DIAGNOSIS — Z01812 Encounter for preprocedural laboratory examination: Secondary | ICD-10-CM | POA: Diagnosis not present

## 2016-09-21 HISTORY — DX: Left bundle-branch block, unspecified: I44.7

## 2016-09-21 HISTORY — DX: Personal history of urinary calculi: Z87.442

## 2016-09-21 HISTORY — DX: Unspecified osteoarthritis, unspecified site: M19.90

## 2016-09-21 LAB — CBC
HCT: 37.8 % (ref 36.0–46.0)
Hemoglobin: 12.3 g/dL (ref 12.0–15.0)
MCH: 27.7 pg (ref 26.0–34.0)
MCHC: 32.5 g/dL (ref 30.0–36.0)
MCV: 85.1 fL (ref 78.0–100.0)
PLATELETS: 273 10*3/uL (ref 150–400)
RBC: 4.44 MIL/uL (ref 3.87–5.11)
RDW: 13.3 % (ref 11.5–15.5)
WBC: 7.5 10*3/uL (ref 4.0–10.5)

## 2016-09-21 LAB — BASIC METABOLIC PANEL
ANION GAP: 12 (ref 5–15)
BUN: 8 mg/dL (ref 6–20)
CALCIUM: 10 mg/dL (ref 8.9–10.3)
CO2: 25 mmol/L (ref 22–32)
Chloride: 106 mmol/L (ref 101–111)
Creatinine, Ser: 0.5 mg/dL (ref 0.44–1.00)
Glucose, Bld: 109 mg/dL — ABNORMAL HIGH (ref 65–99)
Potassium: 4.1 mmol/L (ref 3.5–5.1)
SODIUM: 143 mmol/L (ref 135–145)

## 2016-09-21 LAB — GLUCOSE, CAPILLARY: GLUCOSE-CAPILLARY: 107 mg/dL — AB (ref 65–99)

## 2016-09-21 NOTE — Progress Notes (Addendum)
PCP is Dr. Etter Sjogren   [place] 2/27 Doesn't check her blood sugars hardly ever.  Her A1c was drawn 07/2016 - 7.4  Did go see Dr. Loletha Grayer End 03/2016   She did at one time c/o chest pains, echo & stress tests done -- not cardiac in origin, per the patient.  Denies murmur. She states she takes the losartan to protect her liver from the diabetes.Marland Kitchen

## 2016-09-24 ENCOUNTER — Encounter: Payer: Self-pay | Admitting: Rehabilitative and Restorative Service Providers"

## 2016-09-24 ENCOUNTER — Encounter (HOSPITAL_COMMUNITY): Payer: Self-pay

## 2016-09-24 NOTE — Progress Notes (Signed)
Anesthesia Chart Review:  Pt is a 51 year old female scheduled for completion of thyroidectomy (left) on 09/28/2016 with Izora Gala, M.D.  - PCP is Roma Schanz, MD - Pt was evaluated 03/07/16 by cardiologist Nelva Bush, MD for atypical chest pain. Stress test and echo ordered, results below.   PMH includes: rate-related LBBB, DM, hyperlipidemia, asthma, anemia (remote Hx), tuberculosis (treated 2008),. Never smoker. BMI 37. S/p partial thyroidectomy 09/28/11.   Medications include: ASA 81 mg, losartan, Protonix, simvastatin, sitagliptin-metformin  Preoperative labs reviewed. Glucose 109; HbA1c was 7.4 on 08/21/16  CXR 02/10/16: There is no active cardiopulmonary disease.  EKG 03/07/16: NSR  Nuclear stress test 03/21/16: Low risk stress nuclear study with normal perfusion and normal left ventricular regional and global systolic function. Rate related LBBB developed during stress. Future studies should be performed with pharmacological stress.  Echo 03/11/16:  - Left ventricle: The cavity size was normal. There was mild concentric hypertrophy. Systolic function was normal. The estimated ejection fraction was in the range of 55% to 60%. Wall motion was normal; there were no regional wall motion abnormalities. Left ventricular diastolic function parameters were normal. - Atrial septum: No defect or patent foramen ovale was identified.  If no changes, I anticipate pt can proceed with surgery as scheduled.   Willeen Cass, FNP-BC [facility] Short Stay Surgical Center/Anesthesiology Phone: [phone] 09/24/2016 3:07 PM

## 2016-09-27 ENCOUNTER — Encounter: Payer: Self-pay | Admitting: Rehabilitative and Restorative Service Providers"

## 2016-09-27 ENCOUNTER — Encounter (HOSPITAL_COMMUNITY): Payer: Self-pay | Admitting: Anesthesiology

## 2016-09-27 ENCOUNTER — Ambulatory Visit (INDEPENDENT_AMBULATORY_CARE_PROVIDER_SITE_OTHER): Payer: BLUE CROSS/BLUE SHIELD | Admitting: Physical Therapy

## 2016-09-27 DIAGNOSIS — M546 Pain in thoracic spine: Secondary | ICD-10-CM | POA: Diagnosis not present

## 2016-09-27 DIAGNOSIS — R29898 Other symptoms and signs involving the musculoskeletal system: Secondary | ICD-10-CM

## 2016-09-27 NOTE — Therapy (Addendum)
[place], [place], [address] Phone: [phone]   Fax:  [phone]  Physical Therapy Treatment  Patient Details  Name: Felicia Solis MRN: [id number] Date of Birth: 05-16-1966 Referring Provider: Dr. Karlton Lemon   Encounter Date: 09/27/2016      PT End of Session - 09/27/16 1527    Visit Number 8   Number of Visits 12   Date for PT Re-Evaluation 10/09/16   PT Start Time 0947  pt arrived late   PT Stop Time 1540   PT Time Calculation (min) 45 min   Activity Tolerance Patient tolerated treatment well;No increased pain   Behavior During Therapy WFL for tasks assessed/performed      Past Medical History:  Diagnosis Date  . Anemia    anemia  when pregnant.  10 yrs ago.   Marland Kitchen Anxiety   . Arthritis   . Asthma 5 yrs ago    told she had asthma.. does not remember doctors name.   . Depression    takes citalopram  . Diabetes ([place]) 2016  . Headache(784.0)    years ago told h/a s were migraines.   . History of kidney stones   . Hyperlipidemia   . LBBB (left bundle branch block)    rate-related; identified on stress test 03/21/16  . Renal disorder    kidney stones  . Shortness of breath    with exertion  . Thyroid disease   . Tuberculosis    pt states she took 9 months of medicine for positive tb skin test. 2008    Past Surgical History:  Procedure Laterality Date  . DILATION AND CURETTAGE OF UTERUS  1999  . ENDOMETRIAL ABLATION    . LAPAROSCOPY     years ago ..pt does not know where surgery was done ... states" my stomach would blow up then go down"  . THYROIDECTOMY  09/28/2011   Procedure: THYROIDECTOMY;  Surgeon: Izora Gala, MD;  Location: [facility];  Service: ENT;  Laterality: Right;  RIGHT THYROIDECTOMY WITH FROZEN SECTION  . TUBAL LIGATION      There were no vitals filed for this visit.      Subjective Assessment - 09/27/16 1457    Subjective doing alright, no pain today, but rates  as 1-2/10.  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  feels 75% better.   Pertinent History AODM; denies any musculoskeletal injury; lumbar strain 2003; arthritic changes by xray    Patient Stated Goals get rid of pain    Currently in Pain? Yes   Pain Score 2    Pain Location Back   Pain Orientation Right;Mid   Pain Descriptors / Indicators Dull   Pain Type Acute pain   Pain Onset More than a month ago   Pain Frequency Intermittent   Aggravating Factors  twisting, bending   Pain Relieving Factors heat, TENS                         [facility] Adult PT Treatment/Exercise - 09/27/16 1501      Lumbar Exercises: Stretches   Quadruped Mid Back Stretch Limitations seated thoracic stretch 5x30 sec     Shoulder Exercises: Standing   External Rotation Both;20 reps;Theraband   Theraband Level (Shoulder External Rotation) Level 2 (Red)   Extension Strengthening;Both;20 reps;Theraband   Theraband Level (Shoulder Extension) Level 2 (Red)  Row Strengthening;Both;20 reps;Theraband   Theraband Level (Shoulder Row) Level 2 (Red)     Shoulder Exercises: ROM/Strengthening   UBE (Upper Arm Bike) L2 x 4 min (2' fwd/ 2' bwd)     Shoulder Exercises: Stretch   Other Shoulder Stretches 3 way doorway stretch 2x30 sec each position     Moist Heat Therapy   Number Minutes Moist Heat 15 Minutes   Moist Heat Location --  thoracic     Electrical Stimulation   Electrical Stimulation Location thoracic paraspinals   Electrical Stimulation Action IFC   Electrical Stimulation Parameters to tolerance   Electrical Stimulation Goals Pain;Tone                PT Education - 09/27/16 1527    Education provided Yes   Education Details current progress and POC   Person(s) Educated Patient   Methods Explanation   Comprehension Verbalized understanding             PT Long Term Goals - 09/27/16 1528      PT LONG  TERM GOAL #1   Title Improve posture and alignment with patient to demonstrated improved thoracic extension 10/09/16   Status On-going     PT LONG TERM GOAL #2   Title Patient to demonstrated normal strength bilat UE's without pain with resistive testing 10/09/16   Status On-going     PT LONG TERM GOAL #3   Title Patient to report 75-90% decrease in pain through the thoracic spine 10/09/16   Baseline 09/27/16: 75% better   Status Achieved     PT LONG TERM GOAL #4   Title Independent in HEP 10/09/16   Baseline 09/27/16: independent with current HEP   Status Achieved     PT LONG TERM GOAL #5   Title Improve FOTO to </= 33% limitation 10/09/16   Status On-going               Plan - 09/27/16 1528    Clinical Impression Statement Session today focused on review of current HEP which pt is independent with.  Discussed current progress and pt feels she is ready to transition to home program at end of plan of care.  Recommend 30 day hold and pt to call if symptoms return once all goals addressed.  Two LTGs met today.  Pt also scheduled for surgery tomorrow (thyroid) and recommended pt discuss any precautions with MD.  Pt verbalized understanding.   PT Treatment/Interventions Patient/family education;Neuromuscular re-education;ADLs/Self Care Home Management;Cryotherapy;Electrical Stimulation;Iontophoresis '4mg'$ /ml Dexamethasone;Moist Heat;Ultrasound;Dry needling;Manual techniques;Therapeutic activities;Therapeutic exercise   PT Next Visit Plan continue looking at [facility], plan to hold PT after next week in hopes to transition to home program   Consulted and Agree with Plan of Care Patient      Patient will benefit from skilled therapeutic intervention in order to improve the following deficits and impairments:  Postural dysfunction, Improper body mechanics, Pain, Decreased range of motion, Decreased mobility, Decreased strength, Increased fascial restricitons, Increased muscle spasms, Decreased  activity tolerance  Visit Diagnosis: Pain in thoracic spine  Other symptoms and signs involving the musculoskeletal system     Problem List Patient Active Problem List   Diagnosis Date Noted  . Memory loss 08/23/2016  . Thyroid nodule 08/02/2016  . Plantar fasciitis of right foot 12/01/2015  . Knee pain, right 12/01/2015  . Headache, migraine 09/06/2015  . Bilateral knee pain 08/22/2015  . Thoracic myofascial strain 08/22/2015  . Dysphagia 04/10/2015  . Atypical chest pain 04/10/2015  .  Epigastric pain 04/10/2015  . Functional diarrhea 04/10/2015  . NSAID long-term use 04/10/2015  . Midepigastric pain 03/22/2015  . Cough 01/07/2015  . Allergic rhinitis 12/13/2014  . Acute bronchitis 12/13/2014  . Blood in stool 10/01/2014  . Diabetes mellitus type II, uncontrolled ([place]) 08/13/2014  . Abnormal ECG 05/14/2012  . Chest pain 10/23/2011  . ASCUS on Pap smear 10/08/2011  . H/O thyroid nodule 08/27/2011  . Depression with anxiety 08/27/2011  . Heavy periods 08/27/2011      Laureen Abrahams, PT, DPT 09/27/16 3:32 PM    [facility] [place], [place], [address] Phone: [phone]   Fax:  [phone]  Name: Felicia Solis MRN: [id number] Date of Birth: 03/29/1966  PHYSICAL THERAPY DISCHARGE SUMMARY  Visits from Start of Care: 8  Current functional level related to goals / functional outcomes: See progress note for discharge status. Patient progressed well during course of therapy. Independent in HEP and returned to normal functional activities.    Remaining deficits: Intermittent pain or discomfort    Education / Equipment: HEP Plan: Patient agrees to discharge.  Patient goals were partially met. Patient is being discharged due to being pleased with the current functional level.  ?????     Celyn P. Helene Kelp PT, MPH 10/26/16 9:45 AM

## 2016-09-28 ENCOUNTER — Ambulatory Visit (HOSPITAL_COMMUNITY): Payer: BLUE CROSS/BLUE SHIELD

## 2016-09-28 ENCOUNTER — Encounter (HOSPITAL_COMMUNITY)
Admission: RE | Disposition: A | Discharge status: Home / Self Care | Payer: Self-pay | Source: Ambulatory Visit | Attending: Otolaryngology

## 2016-09-28 ENCOUNTER — Encounter (HOSPITAL_COMMUNITY): Payer: Self-pay | Admitting: *Deleted

## 2016-09-28 ENCOUNTER — Encounter (HOSPITAL_COMMUNITY): Payer: Self-pay | Admitting: Urology

## 2016-09-28 ENCOUNTER — Ambulatory Visit (HOSPITAL_COMMUNITY): Payer: BLUE CROSS/BLUE SHIELD | Admitting: Certified Registered Nurse Anesthetist

## 2016-09-28 ENCOUNTER — Observation Stay (HOSPITAL_COMMUNITY)
Admission: RE | Admit: 2016-09-28 | Discharge: 2016-09-29 | Disposition: A | Discharge status: Home / Self Care | Payer: BLUE CROSS/BLUE SHIELD | Source: Ambulatory Visit | Attending: Otolaryngology | Admitting: Otolaryngology

## 2016-09-28 ENCOUNTER — Ambulatory Visit (HOSPITAL_COMMUNITY): Payer: BLUE CROSS/BLUE SHIELD | Admitting: Emergency Medicine

## 2016-09-28 DIAGNOSIS — E049 Nontoxic goiter, unspecified: Secondary | ICD-10-CM | POA: Diagnosis not present

## 2016-09-28 DIAGNOSIS — F419 Anxiety disorder, unspecified: Secondary | ICD-10-CM | POA: Insufficient documentation

## 2016-09-28 DIAGNOSIS — Z7984 Long term (current) use of oral hypoglycemic drugs: Secondary | ICD-10-CM | POA: Insufficient documentation

## 2016-09-28 DIAGNOSIS — J45909 Unspecified asthma, uncomplicated: Secondary | ICD-10-CM | POA: Diagnosis not present

## 2016-09-28 DIAGNOSIS — Z7982 Long term (current) use of aspirin: Secondary | ICD-10-CM | POA: Diagnosis not present

## 2016-09-28 DIAGNOSIS — F329 Major depressive disorder, single episode, unspecified: Secondary | ICD-10-CM | POA: Insufficient documentation

## 2016-09-28 DIAGNOSIS — Z9851 Tubal ligation status: Secondary | ICD-10-CM | POA: Diagnosis not present

## 2016-09-28 DIAGNOSIS — E041 Nontoxic single thyroid nodule: Secondary | ICD-10-CM | POA: Diagnosis not present

## 2016-09-28 DIAGNOSIS — R0602 Shortness of breath: Secondary | ICD-10-CM | POA: Diagnosis not present

## 2016-09-28 DIAGNOSIS — Z79899 Other long term (current) drug therapy: Secondary | ICD-10-CM | POA: Insufficient documentation

## 2016-09-28 DIAGNOSIS — E785 Hyperlipidemia, unspecified: Secondary | ICD-10-CM | POA: Diagnosis not present

## 2016-09-28 DIAGNOSIS — R05 Cough: Secondary | ICD-10-CM | POA: Diagnosis not present

## 2016-09-28 DIAGNOSIS — E119 Type 2 diabetes mellitus without complications: Secondary | ICD-10-CM | POA: Insufficient documentation

## 2016-09-28 DIAGNOSIS — E890 Postprocedural hypothyroidism: Secondary | ICD-10-CM

## 2016-09-28 DIAGNOSIS — Z01818 Encounter for other preprocedural examination: Secondary | ICD-10-CM

## 2016-09-28 HISTORY — DX: Nonspecific reaction to tuberculin skin test without active tuberculosis: R76.11

## 2016-09-28 HISTORY — PX: THYROIDECTOMY: SHX17

## 2016-09-28 HISTORY — DX: Low back pain, unspecified: M54.50

## 2016-09-28 HISTORY — DX: Type 2 diabetes mellitus without complications: E11.9

## 2016-09-28 HISTORY — DX: Other chronic pain: G89.29

## 2016-09-28 HISTORY — DX: Low back pain: M54.5

## 2016-09-28 HISTORY — DX: Hypothyroidism, unspecified: E03.9

## 2016-09-28 HISTORY — DX: Personal history of other diseases of the digestive system: Z87.19

## 2016-09-28 HISTORY — DX: Migraine, unspecified, not intractable, without status migrainosus: G43.909

## 2016-09-28 LAB — CALCIUM
Calcium: 9.1 mg/dL (ref 8.9–10.3)
Calcium: 8.5 mg/dL — ABNORMAL LOW (ref 8.9–10.3)

## 2016-09-28 LAB — GLUCOSE, CAPILLARY
Glucose-Capillary: 156 mg/dL — ABNORMAL HIGH (ref 65–99)
Glucose-Capillary: 136 mg/dL — ABNORMAL HIGH (ref 65–99)
Glucose-Capillary: 168 mg/dL — ABNORMAL HIGH (ref 65–99)

## 2016-09-28 SURGERY — THYROIDECTOMY
Anesthesia: General | Site: Neck | Laterality: Left

## 2016-09-28 MED ORDER — ACETAMINOPHEN 500 MG PO TABS
1000.0000 mg | ORAL_TABLET | Freq: Four times a day (QID) | ORAL | Status: DC | PRN
  Administered 2016-09-29: 1000 mg via ORAL
  Filled 2016-09-28: qty 2

## 2016-09-28 MED ORDER — MEPERIDINE HCL 25 MG/ML IJ SOLN: 6.2500 mg | INTRAMUSCULAR | Status: DC | PRN

## 2016-09-28 MED ORDER — LINAGLIPTIN 5 MG PO TABS
5.0000 mg | ORAL_TABLET | Freq: Every day | ORAL | Status: DC
  Administered 2016-09-29: 5 mg via ORAL
  Filled 2016-09-28: qty 1

## 2016-09-28 MED ORDER — SUGAMMADEX SODIUM 200 MG/2ML IV SOLN
INTRAVENOUS | Status: AC
  Filled 2016-09-28: qty 2

## 2016-09-28 MED ORDER — DICLOFENAC SODIUM 75 MG PO TBEC
75.0000 mg | DELAYED_RELEASE_TABLET | Freq: Two times a day (BID) | ORAL | Status: DC | PRN
  Filled 2016-09-28: qty 1

## 2016-09-28 MED ORDER — CITALOPRAM HYDROBROMIDE 20 MG PO TABS
20.0000 mg | ORAL_TABLET | Freq: Every day | ORAL | Status: DC
  Administered 2016-09-28 – 2016-09-29 (×2): 20 mg via ORAL
  Filled 2016-09-28 (×2): qty 1

## 2016-09-28 MED ORDER — LACTATED RINGERS IV SOLN
INTRAVENOUS | Status: DC | PRN
  Administered 2016-09-28 (×2): via INTRAVENOUS

## 2016-09-28 MED ORDER — WHITE PETROLATUM GEL
Status: AC
Start: 2016-09-28 — End: 2016-09-28
  Administered 2016-09-28: 23:00:00
  Filled 2016-09-28: qty 1

## 2016-09-28 MED ORDER — ONDANSETRON HCL 4 MG/2ML IJ SOLN
INTRAMUSCULAR | Status: AC
  Filled 2016-09-28: qty 2

## 2016-09-28 MED ORDER — 0.9 % SODIUM CHLORIDE (POUR BTL) OPTIME
TOPICAL | Status: DC | PRN
  Administered 2016-09-28: 1000 mL

## 2016-09-28 MED ORDER — ROCURONIUM BROMIDE 50 MG/5ML IV SOSY
PREFILLED_SYRINGE | INTRAVENOUS | Status: AC
  Filled 2016-09-28: qty 10

## 2016-09-28 MED ORDER — FENTANYL CITRATE (PF) 100 MCG/2ML IJ SOLN
INTRAMUSCULAR | Status: DC | PRN
  Administered 2016-09-28: 100 ug via INTRAVENOUS
  Administered 2016-09-28: 150 ug via INTRAVENOUS

## 2016-09-28 MED ORDER — SITAGLIP PHOS-METFORMIN HCL ER 100-1000 MG PO TB24: 1.0000 | ORAL_TABLET | Freq: Every day | ORAL | Status: DC

## 2016-09-28 MED ORDER — CYCLOBENZAPRINE HCL 10 MG PO TABS
10.0000 mg | ORAL_TABLET | Freq: Two times a day (BID) | ORAL | Status: DC | PRN
  Administered 2016-09-28: 10 mg via ORAL
  Filled 2016-09-28 (×2): qty 1

## 2016-09-28 MED ORDER — KETOROLAC TROMETHAMINE 30 MG/ML IJ SOLN
INTRAMUSCULAR | Status: AC
  Filled 2016-09-28: qty 1

## 2016-09-28 MED ORDER — PROMETHAZINE HCL 25 MG/ML IJ SOLN
6.2500 mg | INTRAMUSCULAR | Status: DC | PRN
Start: 2016-09-28 — End: 2016-09-28

## 2016-09-28 MED ORDER — PROPOFOL 10 MG/ML IV BOLUS
INTRAVENOUS | Status: AC
  Filled 2016-09-28: qty 40

## 2016-09-28 MED ORDER — HYDROMORPHONE HCL 1 MG/ML IJ SOLN
INTRAMUSCULAR | Status: AC
  Filled 2016-09-28: qty 1

## 2016-09-28 MED ORDER — SIMVASTATIN 40 MG PO TABS
40.0000 mg | ORAL_TABLET | Freq: Every day | ORAL | Status: DC
  Administered 2016-09-28: 40 mg via ORAL
  Filled 2016-09-28: qty 1

## 2016-09-28 MED ORDER — HYDROCODONE-ACETAMINOPHEN 7.5-325 MG PO TABS: 1.0000 | ORAL_TABLET | Freq: Four times a day (QID) | ORAL | 0 refills | Status: DC | PRN

## 2016-09-28 MED ORDER — LEVOTHYROXINE SODIUM 100 MCG PO TABS: 100.0000 ug | ORAL_TABLET | Freq: Every day | ORAL | 6 refills | Status: DC

## 2016-09-28 MED ORDER — DEXTROSE-NACL 5-0.9 % IV SOLN
INTRAVENOUS | Status: DC
  Administered 2016-09-28 (×2): via INTRAVENOUS

## 2016-09-28 MED ORDER — HYDROCODONE-ACETAMINOPHEN 5-325 MG PO TABS
1.0000 | ORAL_TABLET | ORAL | Status: DC | PRN
  Administered 2016-09-28 (×2): 2 via ORAL
  Filled 2016-09-28 (×2): qty 2

## 2016-09-28 MED ORDER — LEVOTHYROXINE SODIUM 100 MCG PO TABS
100.0000 ug | ORAL_TABLET | Freq: Every day | ORAL | Status: DC
  Administered 2016-09-29: 100 ug via ORAL
  Filled 2016-09-28: qty 1

## 2016-09-28 MED ORDER — FLAXSEED OIL 1000 MG PO CAPS: 1300.0000 mg | ORAL_CAPSULE | Freq: Every day | ORAL | Status: DC

## 2016-09-28 MED ORDER — CYCLOBENZAPRINE HCL 10 MG PO TABS
ORAL_TABLET | ORAL | Status: AC
  Filled 2016-09-28: qty 1

## 2016-09-28 MED ORDER — METFORMIN HCL 500 MG PO TABS
1000.0000 mg | ORAL_TABLET | Freq: Every day | ORAL | Status: DC
  Administered 2016-09-29: 1000 mg via ORAL
  Filled 2016-09-28: qty 2

## 2016-09-28 MED ORDER — PROPOFOL 10 MG/ML IV BOLUS
INTRAVENOUS | Status: DC | PRN
  Administered 2016-09-28: 200 mg via INTRAVENOUS

## 2016-09-28 MED ORDER — LIDOCAINE 2% (20 MG/ML) 5 ML SYRINGE
INTRAMUSCULAR | Status: AC
  Filled 2016-09-28: qty 5

## 2016-09-28 MED ORDER — ASPIRIN EC 81 MG PO TBEC
81.0000 mg | DELAYED_RELEASE_TABLET | Freq: Every day | ORAL | Status: DC
  Administered 2016-09-29: 81 mg via ORAL
  Filled 2016-09-28: qty 1

## 2016-09-28 MED ORDER — ONDANSETRON HCL 4 MG/2ML IJ SOLN
INTRAMUSCULAR | Status: DC | PRN
  Administered 2016-09-28: 4 mg via INTRAVENOUS

## 2016-09-28 MED ORDER — OMEGA-3-ACID ETHYL ESTERS 1 G PO CAPS
1.0000 g | ORAL_CAPSULE | Freq: Every day | ORAL | Status: DC
  Administered 2016-09-29: 1 g via ORAL
  Filled 2016-09-28: qty 1

## 2016-09-28 MED ORDER — LIDOCAINE HCL (CARDIAC) 20 MG/ML IV SOLN
INTRAVENOUS | Status: DC | PRN
  Administered 2016-09-28: 30 mg via INTRAVENOUS

## 2016-09-28 MED ORDER — BIOTIN 5000 MCG PO CAPS: 5000.0000 ug | ORAL_CAPSULE | Freq: Every day | ORAL | Status: DC

## 2016-09-28 MED ORDER — VITAMIN B-12 1000 MCG PO TABS
500.0000 ug | ORAL_TABLET | Freq: Every day | ORAL | Status: DC
  Administered 2016-09-28 – 2016-09-29 (×2): 500 ug via ORAL
  Filled 2016-09-28 (×2): qty 1

## 2016-09-28 MED ORDER — LOSARTAN POTASSIUM 50 MG PO TABS
25.0000 mg | ORAL_TABLET | Freq: Every day | ORAL | Status: DC
  Administered 2016-09-28 – 2016-09-29 (×2): 25 mg via ORAL
  Filled 2016-09-28 (×2): qty 1

## 2016-09-28 MED ORDER — PROMETHAZINE HCL 25 MG RE SUPP: 25.0000 mg | Freq: Four times a day (QID) | RECTAL | 1 refills | Status: DC | PRN

## 2016-09-28 MED ORDER — SUGAMMADEX SODIUM 500 MG/5ML IV SOLN
INTRAVENOUS | Status: DC | PRN
  Administered 2016-09-28: 209.4 mg via INTRAVENOUS

## 2016-09-28 MED ORDER — PROMETHAZINE HCL 25 MG RE SUPP: 25.0000 mg | Freq: Four times a day (QID) | RECTAL | Status: DC | PRN

## 2016-09-28 MED ORDER — GINKGO BILOBA EXTRACT 60 MG PO CAPS: 60.0000 mg | ORAL_CAPSULE | Freq: Every day | ORAL | Status: DC

## 2016-09-28 MED ORDER — PANTOPRAZOLE SODIUM 40 MG PO TBEC
40.0000 mg | DELAYED_RELEASE_TABLET | Freq: Every day | ORAL | Status: DC
  Administered 2016-09-28 – 2016-09-29 (×2): 40 mg via ORAL
  Filled 2016-09-28 (×2): qty 1

## 2016-09-28 MED ORDER — PROMETHAZINE HCL 25 MG PO TABS
25.0000 mg | ORAL_TABLET | Freq: Four times a day (QID) | ORAL | Status: DC | PRN
  Administered 2016-09-28: 25 mg via ORAL
  Filled 2016-09-28: qty 1

## 2016-09-28 MED ORDER — ROCURONIUM BROMIDE 100 MG/10ML IV SOLN
INTRAVENOUS | Status: DC | PRN
  Administered 2016-09-28: 10 mg via INTRAVENOUS
  Administered 2016-09-28: 50 mg via INTRAVENOUS

## 2016-09-28 MED ORDER — FENTANYL CITRATE (PF) 250 MCG/5ML IJ SOLN
INTRAMUSCULAR | Status: AC
  Filled 2016-09-28: qty 5

## 2016-09-28 MED ORDER — VITAMIN D 1000 UNITS PO TABS
2000.0000 [IU] | ORAL_TABLET | Freq: Every day | ORAL | Status: DC
  Administered 2016-09-28 – 2016-09-29 (×2): 2000 [IU] via ORAL
  Filled 2016-09-28 (×2): qty 2

## 2016-09-28 MED ORDER — FLUTICASONE PROPIONATE 50 MCG/ACT NA SUSP
2.0000 | Freq: Every day | NASAL | Status: DC | PRN
  Filled 2016-09-28: qty 16

## 2016-09-28 MED ORDER — HYDROMORPHONE HCL 1 MG/ML IJ SOLN
0.2500 mg | INTRAMUSCULAR | Status: DC | PRN
  Administered 2016-09-28: 0.5 mg via INTRAVENOUS

## 2016-09-28 MED ORDER — KETOROLAC TROMETHAMINE 30 MG/ML IJ SOLN
30.0000 mg | Freq: Once | INTRAMUSCULAR | Status: DC | PRN
  Administered 2016-09-28: 30 mg via INTRAVENOUS

## 2016-09-28 MED ORDER — LACTATED RINGERS IV SOLN
INTRAVENOUS | Status: DC
  Administered 2016-09-28: 08:00:00 via INTRAVENOUS

## 2016-09-28 MED ORDER — MIDAZOLAM HCL 2 MG/2ML IJ SOLN
INTRAMUSCULAR | Status: DC | PRN
  Administered 2016-09-28: 2 mg via INTRAVENOUS

## 2016-09-28 MED ORDER — MIDAZOLAM HCL 2 MG/2ML IJ SOLN
INTRAMUSCULAR | Status: AC
  Filled 2016-09-28: qty 2

## 2016-09-28 SURGICAL SUPPLY — 36 items
BLADE SURG 15 STRL LF DISP TIS (BLADE) IMPLANT
BLADE SURG 15 STRL SS (BLADE)
CANISTER SUCT 3000ML PPV (MISCELLANEOUS) ×2 IMPLANT
CLEANER TIP ELECTROSURG 2X2 (MISCELLANEOUS) ×2 IMPLANT
CONT SPEC 4OZ CLIKSEAL STRL BL (MISCELLANEOUS) ×2 IMPLANT
CORDS BIPOLAR (ELECTRODE) ×2 IMPLANT
COVER SURGICAL LIGHT HANDLE (MISCELLANEOUS) ×2 IMPLANT
DERMABOND ADVANCED (GAUZE/BANDAGES/DRESSINGS) ×1
DERMABOND ADVANCED .7 DNX12 (GAUZE/BANDAGES/DRESSINGS) ×1 IMPLANT
DRAIN HEMOVAC 7FR (DRAIN) IMPLANT
DRAIN SNY 10 ROU (WOUND CARE) IMPLANT
DRAPE PROXIMA HALF (DRAPES) IMPLANT
ELECT COATED BLADE 2.86 ST (ELECTRODE) ×2 IMPLANT
ELECT REM PT RETURN 9FT ADLT (ELECTROSURGICAL) ×2
ELECTRODE REM PT RTRN 9FT ADLT (ELECTROSURGICAL) ×1 IMPLANT
EVACUATOR SILICONE 100CC (DRAIN) ×2 IMPLANT
FORCEPS BIPOLAR SPETZLER 8 1.0 (NEUROSURGERY SUPPLIES) ×2 IMPLANT
GAUZE SPONGE 4X4 16PLY XRAY LF (GAUZE/BANDAGES/DRESSINGS) IMPLANT
GLOVE BIO SURGEON STRL SZ 6.5 (GLOVE) IMPLANT
GLOVE ECLIPSE 7.5 STRL STRAW (GLOVE) ×2 IMPLANT
GOWN STRL REUS W/ TWL LRG LVL3 (GOWN DISPOSABLE) ×2 IMPLANT
GOWN STRL REUS W/TWL LRG LVL3 (GOWN DISPOSABLE) ×2
KIT BASIN OR (CUSTOM PROCEDURE TRAY) ×2 IMPLANT
KIT ROOM TURNOVER OR (KITS) ×2 IMPLANT
NEEDLE PRECISIONGLIDE 27X1.5 (NEEDLE) ×2 IMPLANT
NS IRRIG 1000ML POUR BTL (IV SOLUTION) ×2 IMPLANT
PAD ARMBOARD 7.5X6 YLW CONV (MISCELLANEOUS) ×4 IMPLANT
PENCIL FOOT CONTROL (ELECTRODE) ×2 IMPLANT
SHEARS HARMONIC 9CM CVD (BLADE) ×2 IMPLANT
STAPLER VISISTAT 35W (STAPLE) ×2 IMPLANT
SUT CHROMIC 4 0 PS 2 18 (SUTURE) ×4 IMPLANT
SUT ETHILON 3 0 PS 1 (SUTURE) ×2 IMPLANT
SUT SILK 3 0 REEL (SUTURE) IMPLANT
SUT SILK 4 0 REEL (SUTURE) ×2 IMPLANT
TOWEL OR 17X24 6PK STRL BLUE (TOWEL DISPOSABLE) ×2 IMPLANT
TRAY ENT MC OR (CUSTOM PROCEDURE TRAY) ×2 IMPLANT

## 2016-09-28 NOTE — Transfer of Care (Signed)
Immediate Anesthesia Transfer of Care Note  Patient: Felicia Solis  Procedure(s) Performed: Procedure(s): COMPLETION OF THYROIDECTOMY (Left)  Patient Location: PACU  Anesthesia Type:General  Level of Consciousness: awake, alert  and oriented  Airway & Oxygen Therapy: Patient Spontanous Breathing and Patient connected to nasal cannula oxygen  Post-op Assessment: Report given to RN and Post -op Vital signs reviewed and stable  Post vital signs: Reviewed and stable  Last Vitals:  Vitals:   09/28/16 0742  BP: 129/68  Pulse: 71  Resp: 18  Temp: 36.8 C    Last Pain:  Vitals:   09/28/16 0742  TempSrc: Oral         Complications: No apparent anesthesia complications

## 2016-09-28 NOTE — Interval H&P Note (Signed)
History and Physical Interval Note:  09/28/2016 9:14 AM  Felicia Solis  has presented today for surgery, with the diagnosis of LEFT THYROID NODULE  The various methods of treatment have been discussed with the patient and family. After consideration of risks, benefits and other options for treatment, the patient has consented to  Procedure(s): COMPLETION OF THYROIDECTOMY (Left) as a surgical intervention .  The patient's history has been reviewed, patient examined, no change in status, stable for surgery.  I have reviewed the patient's chart and labs.  Questions were answered to the patient's satisfaction.     Shaena Parkerson

## 2016-09-28 NOTE — Progress Notes (Signed)
PHARMACIST - PHYSICIAN ORDER COMMUNICATION  CONCERNING: P&T Medication Policy on Herbal Medications  DESCRIPTION:  This patient's orders for:  Gingko biloba, Biotin and Flaxseed oil have been noted.  These products) are classified as an "herbal" or natural products. Due to a lack of definitive safety studies or FDA approval, nonstandard manufacturing practices, plus the potential risk of unknown drug-drug interactions while on inpatient medications, the Pharmacy and Therapeutics Committee does not permit the use of "herbal" or natural products of this type within [facility].   ACTION TAKEN: The pharmacy department is unable to verify this order at this time. Please reevaluate patient's clinical condition at discharge and address if the herbal or natural product(s) should be resumed at that time.   [name], [place] 09/28/2016 12:51 PM

## 2016-09-28 NOTE — Progress Notes (Signed)
Patient ID: Felicia Solis, female   DOB: 1965/11/12, 51 y.o.   MRN: [id number] Postop check  Sitting up, eating dinner, appears comfortable, breathing clearly. Voice is normal. Drain in place. Neck looks excellent.  Satisfactory check, observe her overnight.

## 2016-09-28 NOTE — Anesthesia Procedure Notes (Signed)
Procedure Name: Intubation Date/Time: 09/28/2016 9:47 AM Performed by: Eligha Bridegroom Pre-anesthesia Checklist: Patient identified, Emergency Drugs available, Suction available, Patient being monitored and Timeout performed Patient Re-evaluated:Patient Re-evaluated prior to inductionOxygen Delivery Method: Circle system utilized Preoxygenation: Pre-oxygenation with 100% oxygen Intubation Type: IV induction Ventilation: Mask ventilation without difficulty and Oral airway inserted - appropriate to patient size Laryngoscope Size: Mac and 3 Grade View: Grade II Tube type: Oral Tube size: 7.0 mm Number of attempts: 1 Airway Equipment and Method: Stylet Placement Confirmation: ETT inserted through vocal cords under direct vision and breath sounds checked- equal and bilateral Secured at: 21 cm Tube secured with: Tape Dental Injury: Teeth and Oropharynx as per pre-operative assessment

## 2016-09-28 NOTE — Anesthesia Postprocedure Evaluation (Addendum)
Anesthesia Post Note  Patient: Felicia Solis  Procedure(s) Performed: Procedure(s) (LRB): COMPLETION OF THYROIDECTOMY (Left)  Patient location during evaluation: PACU Anesthesia Type: General Level of consciousness: awake Pain management: pain level controlled Vital Signs Assessment: post-procedure vital signs reviewed and stable Respiratory status: spontaneous breathing Cardiovascular status: stable Postop Assessment: no signs of nausea or vomiting Anesthetic complications: no        Last Vitals:  Vitals:   09/28/16 1215 09/28/16 1232  BP:  114/77  Pulse: 70   Resp: 15 16  Temp: 36.1 C     Last Pain:  Vitals:   09/28/16 1302  TempSrc:   PainSc: 3    Pain Goal:                 Felicia Solis,Felicia Solis

## 2016-09-28 NOTE — Op Note (Signed)
OPERATIVE REPORT  DATE OF SURGERY: 09/28/2016  PATIENT:  Felicia Solis,  51 y.o. female  PRE-OPERATIVE DIAGNOSIS:  LEFT THYROID NODULE  POST-OPERATIVE DIAGNOSIS:  LEFT THYROID NODULE  PROCEDURE:  Procedure(s): COMPLETION OF THYROIDECTOMY  SURGEON:  Beckie Salts, MD  ASSISTANTS: Jolene Provost PA  ANESTHESIA:   General   EBL:  30 ml  DRAINS: 7 French round JP  LOCAL MEDICATIONS USED:  None  SPECIMEN:  Left thyroid lobe  COUNTS:  Correct  PROCEDURE DETAILS: The patient was taken to the operating room and placed on the operating table in the supine position. A shoulder roll was placed beneath the shoulder blades and the neck was extended. The neck was prepped and draped in a standard fashion. The previous low collar transverse incision was outlined with a marking pen and was incised with electrocautery . Dissection was continued down through the platysma layer.  Self-retaining retractors were used throughout the case.  The midline fascia was divided. The left strap muscles were partially divided to facilitate exposure and reflected laterally. The enlarged left thyroid lobe was identified. Careful dissection was accomplished around the gland. The gland was firm and smooth and very difficult to grasp, partially tore during the procedure. Superior vasculature was identified and ligated between clamps and divided. Harmonic dissection was used. Middle thyroid vein was treated in a similar fashion. Inferior vasculature was also treated that way. The superior parathyroid was identified and preserved with its blood supply. The recurrent nerve was identified and dissected all the way up until its entry into the larynx and preserved. Berry's ligament was divided and the gland was dissected off of the trachea. The specimen was delivered. The wound was irrigated with saline and hemostasis was completed using bipolar cautery.  The drain was secured in place with a nylon suture. The midline fascia  was reapproximated with interrupted chromic suture. The platysma layer was also reapproximated with interrupted chromic suture. A running subcuticular closure was accomplished. Dermabond was used on the skin. The drain was charged. The patient was awakened, extubated and transferred to recovery in stable condition.   PATIENT DISPOSITION:  To PACU, stable

## 2016-09-28 NOTE — Anesthesia Preprocedure Evaluation (Signed)
Anesthesia Evaluation  Patient identified by MRN, date of birth, ID band Patient awake    Reviewed: Allergy & Precautions, H&P , NPO status , Patient's Chart, lab work & pertinent test results  Airway Mallampati: I       Dental no notable dental hx. (+) Teeth Intact   Pulmonary asthma ,  No meds for asthma   Pulmonary exam normal        Cardiovascular Normal cardiovascular exam Rhythm:Regular Rate:Normal     Neuro/Psych  Headaches, Depression    GI/Hepatic   Endo/Other  diabetes, Type 2, Oral Hypoglycemic AgentsThyroid mass  Renal/GU      Musculoskeletal   Abdominal (+) + obese,   Peds  Hematology   Anesthesia Other Findings Gated Spect Myocardial Perfusion Imaging with Exercise Stress 2 Day Rest/Stress Protocol  Order# [id number]  Reading physician: Sanda Klein, MD Ordering physician: Nelva Bush, MD Study date: 03/21/16 Patient Information   Name MRN Description Felicia Solis [id number] 51 y.o. Female Result Notes   Notes Recorded by Rodman Key, RN on 03/30/2016 at 5:10 PM EDT Results reviewed with patient. She will continue her current medications and has f/u in Dec scheduled. ------  Notes Recorded by Nelva Bush, MD on 03/23/2016 at 7:58 AM EDT Please let the patient know that her stress test and echo do not show any significant abnormalities. Rate-related LBBB is unlikely to be related to her symptoms; rate-related IVCD was noted on stress test in 2013 as well. She should continue her current medications and follow-up with me in 2-3 months, as previously scheduled. Thanks.   Vitals   Height Weight BMI (Calculated) 5\' 6"  (1.676 m) 221 lb (100.2 kg) 35.7 Study Highlights     Nuclear stress EF: 61%.  Blood pressure demonstrated a hypertensive response to exercise.  There was no ST segment deviation noted during stress.  Rate related LBBB developed during exercise  The study  is normal.  This is a low risk study.    ECHO COMPLETE WO IMAGE ENHANCING AGENT  Order# [id number]  Reading physician: Josue Hector, MD Ordering physician: Nelva Bush, MD Study date: 03/21/16 Result Notes   Notes Recorded by Rodman Key, RN on 03/30/2016 at 5:09 PM EDT Results reviewed with patient. She will continue her current medications and has f/u in Dec scheduled. ------  Notes Recorded by Nelva Bush, MD on 03/23/2016 at 7:58 AM EDT Please let the patient know that her stress test and echo do not show any significant abnormalities. Rate-related LBBB is unlikely to be related to her symptoms; rate-related IVCD was noted on stress test in 2013 as well. She should continue her current medications and follow-up with me in 2-3 months, as previously scheduled. Thanks.   Study Result   Result status: Final result                          [facility]                        [address], [place] [address]                            [phone]  ------------------------------------------------------------------- Transthoracic Echocardiography  Patient:    Felicia, Solis MR #:  [id number] Study Date: 03/21/2016 Gender:     F Age:        30 Height:     167.6 cm Weight:     100.2 kg BSA:        2.2 m^2 Pt. Status: Room:   ATTENDING    Nelva Bush, MD  ORDERING     Nelva Bush, MD  REFERRING    Nelva Bush, MD  SONOGRAPHER  Marygrace Drought, RCS  PERFORMING   [facility], Outpatient  cc:  ------------------------------------------------------------------- LV EF: 55% -   60%  ------------------------------------------------------------------- Indications:      Chest Pain (R07.9).  -------------------------------------------------------------------    Reproductive/Obstetrics                             Anesthesia Physical  Anesthesia Plan  ASA: II  Anesthesia  Plan: General   Post-op Pain Management:    Induction: Intravenous  Airway Management Planned: Oral ETT  Additional Equipment:   Intra-op Plan:   Post-operative Plan: Extubation in OR  Informed Consent: I have reviewed the patients History and Physical, chart, labs and discussed the procedure including the risks, benefits and alternatives for the proposed anesthesia with the patient or authorized representative who has indicated his/her understanding and acceptance.   Dental advisory given  Plan Discussed with: CRNA and Surgeon  Anesthesia Plan Comments:         Anesthesia Quick Evaluation

## 2016-09-28 NOTE — Discharge Instructions (Signed)
You may shower and use soap and water. Do not use any creams, oils or ointment. ° °

## 2016-09-28 NOTE — H&P (View-Only) (Signed)
Felicia Solis is an 51 y.o. female.   Chief Complaint: Thyroid nodule HPI: Left thyroid nodule increasing in size over the past couple of years. Right side removed about 5 years ago for benign disease.  Past Medical History:  Diagnosis Date  . Anemia    anemia  when pregnant.  10 yrs ago.   . Angina    pt states she has occasional chest pains no other sxs.. seen in ed  at  [facility] .. all tsts negative.   Marland Kitchen Anxiety   . Asthma 5 yrs ago    told she had asthma.. does not remember doctors name.   . Depression    takes citalopram  . Diabetes ([place]) 2016  . Headache(784.0)    years ago told h/a s were migraines.   . Hyperlipidemia   . Renal disorder    kidney stones  . Shortness of breath    with exertion  . Thyroid disease   . Tuberculosis    pt states she took 9 months of medicine for positive tb skin test.     Past Surgical History:  Procedure Laterality Date  . DILATION AND CURETTAGE OF UTERUS  1999  . ENDOMETRIAL ABLATION    . LAPAROSCOPY     years ago ..pt does not know where surgery was done ... states" my stomach would blow up then go down"  . THYROIDECTOMY  09/28/2011   Procedure: THYROIDECTOMY;  Surgeon: Izora Gala, MD;  Location: [facility];  Service: ENT;  Laterality: Right;  RIGHT THYROIDECTOMY WITH FROZEN SECTION  . TUBAL LIGATION      Family History  Problem Relation Age of Onset  . Diabetes Mother   . Hypertension Mother   . Hyperlipidemia Mother   . Gallbladder disease Mother   . Heart disease Mother   . Diabetes Sister   . Hypertension Father   . Hyperlipidemia Father   . Colon polyps Father   . Ovarian cancer Maternal Grandmother   . Gallbladder disease Maternal Grandmother   . Colon cancer Paternal Grandfather   . Breast cancer Maternal Aunt   . Heart disease Other     early cad  . Diabetes Brother   . Heart attack Brother   . Anesthesia problems Neg Hx   . Hypotension Neg Hx   . Malignant hyperthermia Neg Hx   . Pseudochol deficiency Neg Hx    . Esophageal cancer Neg Hx    Social History:  reports that she has never smoked. She has never used smokeless tobacco. She reports that she does not drink alcohol or use drugs.  Allergies:  Allergies  Allergen Reactions  . Lisinopril Cough     (Not in a hospital admission)  No results found for this or any previous visit (from the past 48 hour(s)). No results found.  ROS: otherwise negative  There were no vitals taken for this visit.  PHYSICAL EXAM: Overall appearance:  Healthy appearing, in no distress Head:  Normocephalic, atraumatic. Ears: External auditory canals are clear; tympanic membranes are intact and the middle ears are free of any effusion. Nose: External nose is healthy in appearance. Internal nasal exam free of any lesions or obstruction. Oral Cavity/pharynx:  There are no mucosal lesions or masses identified. Hypopharynx/Larynx: no signs of any mucosal lesions or masses identified. Vocal cords move normally. Neuro:  No identifiable neurologic deficits. Neck: Left thyroid nodule palpable.  Studies Reviewed: none    Assessment/Plan Proceed with completion thyroidectomy.  Vane Yapp 09/05/2016, 12:16  PM

## 2016-09-29 DIAGNOSIS — Z7984 Long term (current) use of oral hypoglycemic drugs: Secondary | ICD-10-CM | POA: Diagnosis not present

## 2016-09-29 DIAGNOSIS — E041 Nontoxic single thyroid nodule: Secondary | ICD-10-CM | POA: Diagnosis not present

## 2016-09-29 DIAGNOSIS — F419 Anxiety disorder, unspecified: Secondary | ICD-10-CM | POA: Diagnosis not present

## 2016-09-29 DIAGNOSIS — F329 Major depressive disorder, single episode, unspecified: Secondary | ICD-10-CM | POA: Diagnosis not present

## 2016-09-29 DIAGNOSIS — J45909 Unspecified asthma, uncomplicated: Secondary | ICD-10-CM | POA: Diagnosis not present

## 2016-09-29 DIAGNOSIS — Z79899 Other long term (current) drug therapy: Secondary | ICD-10-CM | POA: Diagnosis not present

## 2016-09-29 DIAGNOSIS — Z7982 Long term (current) use of aspirin: Secondary | ICD-10-CM | POA: Diagnosis not present

## 2016-09-29 DIAGNOSIS — E785 Hyperlipidemia, unspecified: Secondary | ICD-10-CM | POA: Diagnosis not present

## 2016-09-29 DIAGNOSIS — E119 Type 2 diabetes mellitus without complications: Secondary | ICD-10-CM | POA: Diagnosis not present

## 2016-09-29 DIAGNOSIS — Z9851 Tubal ligation status: Secondary | ICD-10-CM | POA: Diagnosis not present

## 2016-09-29 LAB — GLUCOSE, CAPILLARY
Glucose-Capillary: 230 mg/dL — ABNORMAL HIGH (ref 65–99)
Glucose-Capillary: 119 mg/dL — ABNORMAL HIGH (ref 65–99)
Glucose-Capillary: 134 mg/dL — ABNORMAL HIGH (ref 65–99)

## 2016-09-29 LAB — CALCIUM: CALCIUM: 8.1 mg/dL — AB (ref 8.9–10.3)

## 2016-09-29 NOTE — Progress Notes (Signed)
Pt discharged home accompanied by Nurse tech no duty, no s/s of distress, no JP drain in her neck, no active bleeding noted on surgical site.

## 2016-09-29 NOTE — Discharge Summary (Signed)
Physician Discharge Summary  Patient ID: Felicia Solis MRN: [id number] DOB/AGE: 01/30/1966 51 y.o.  Admit date: 09/28/2016 Discharge date: 09/29/2016  Admission Diagnoses:thyroid goiter  Discharge Diagnoses:  Active Problems:   S/P complete thyroidectomy   Discharged Condition: good  Hospital Course: no complications  Consults: none  Significant Diagnostic Studies: none  Treatments: surgery: completion thyroidectomy  Discharge Exam: Blood pressure (!) 99/53, pulse 64, temperature 98.4 F (36.9 C), temperature source Oral, resp. rate 18, SpO2 100 %. PHYSICAL EXAM: Voice normal, incision excellent, JP removed.  Disposition: 01-Home or Self Care  Discharge Instructions    Diet - low sodium heart healthy    Complete by:  As directed    Increase activity slowly    Complete by:  As directed      Allergies as of 09/29/2016      Reactions   Lisinopril Cough      Medication List    STOP taking these medications   HYDROcodone-acetaminophen 5-325 MG tablet Commonly known as:  NORCO Replaced by:  HYDROcodone-acetaminophen 7.5-325 MG tablet     TAKE these medications   acetaminophen 500 MG tablet Commonly known as:  TYLENOL Take 1,000 mg by mouth every 6 (six) hours as needed for mild pain.   aspirin EC 81 MG tablet Take 1 tablet (81 mg total) by mouth daily.   Biotin 5000 MCG Caps Take 5,000 mcg by mouth daily.   citalopram 20 MG tablet Commonly known as:  CELEXA Take 1 tablet (20 mg total) by mouth daily.   cyanocobalamin 500 MCG tablet Take 500 mcg by mouth daily.   cyclobenzaprine 10 MG tablet Commonly known as:  FLEXERIL Take 1 tablet (10 mg total) by mouth at bedtime. What changed:  when to take this  reasons to take this   diclofenac 75 MG EC tablet Commonly known as:  VOLTAREN Take 1 tablet (75 mg total) by mouth 2 (two) times daily. What changed:  when to take this  reasons to take this   Fish Oil 1200 MG Caps Take 1,200 mg by mouth  daily.   FLAXSEED OIL PO Take 1,300 mg by mouth daily.   fluticasone 50 MCG/ACT nasal spray Commonly known as:  FLONASE Place 2 sprays into both nostrils daily. What changed:  when to take this  reasons to take this   Ginkgo Biloba Extract 60 MG Caps Take 60 mg by mouth daily.   glucose blood test strip Commonly known as:  ONETOUCH VERIO Onetouch Verio Check blood sugar twice daily   HYDROcodone-acetaminophen 7.5-325 MG tablet Commonly known as:  NORCO Take 1 tablet by mouth every 6 (six) hours as needed for moderate pain. Replaces:  HYDROcodone-acetaminophen 5-325 MG tablet   levothyroxine 100 MCG tablet Commonly known as:  SYNTHROID Take 1 tablet (100 mcg total) by mouth daily.   losartan 25 MG tablet Commonly known as:  COZAAR Take 1 tablet (25 mg total) by mouth daily.   ONETOUCH DELICA LANCETS FINE Misc Check blood sugar twice daily   pantoprazole 40 MG tablet Commonly known as:  PROTONIX Take 1 tablet (40 mg total) by mouth daily.   promethazine 25 MG suppository Commonly known as:  PHENERGAN Place 1 suppository (25 mg total) rectally every 6 (six) hours as needed for nausea or vomiting.   simvastatin 40 MG tablet Commonly known as:  ZOCOR Take 1 tablet (40 mg total) by mouth at bedtime.   SitaGLIPtin-MetFORMIN HCl [id number] MG Tb24 Commonly known as:  JANUMET XR Take 1 tablet  by mouth daily.   Vitamin D3 2000 units Tabs Take 2,000 Units by mouth daily.      Follow-up Information    Yeny Schmoll, MD. Schedule an appointment as soon as possible for a visit in 1 week.   Specialty:  Otolaryngology Contact information: [address] [place] [address] [phone]           Signed: Izora Gala 09/29/2016, 9:25 AM

## 2016-09-29 NOTE — Progress Notes (Signed)
Pt for discharge going home, prescriptions andnext appt explained and understood , d/c IV line, all personal belongings given , still waiting for he husband to pick her up possible at 2 pm, no s/s of distress noted.

## 2016-09-30 ENCOUNTER — Other Ambulatory Visit: Payer: Self-pay

## 2016-09-30 ENCOUNTER — Emergency Department (HOSPITAL_COMMUNITY): Payer: BLUE CROSS/BLUE SHIELD

## 2016-09-30 ENCOUNTER — Emergency Department (HOSPITAL_COMMUNITY)
Admission: EM | Admit: 2016-09-30 | Discharge: 2016-10-01 | Disposition: A | Discharge status: Home / Self Care | Payer: BLUE CROSS/BLUE SHIELD | Attending: Emergency Medicine | Admitting: Emergency Medicine

## 2016-09-30 ENCOUNTER — Encounter (HOSPITAL_COMMUNITY): Payer: Self-pay | Admitting: Emergency Medicine

## 2016-09-30 DIAGNOSIS — R0789 Other chest pain: Secondary | ICD-10-CM

## 2016-09-30 DIAGNOSIS — E039 Hypothyroidism, unspecified: Secondary | ICD-10-CM | POA: Insufficient documentation

## 2016-09-30 DIAGNOSIS — E119 Type 2 diabetes mellitus without complications: Secondary | ICD-10-CM | POA: Diagnosis not present

## 2016-09-30 DIAGNOSIS — Z7984 Long term (current) use of oral hypoglycemic drugs: Secondary | ICD-10-CM | POA: Diagnosis not present

## 2016-09-30 DIAGNOSIS — R079 Chest pain, unspecified: Secondary | ICD-10-CM | POA: Diagnosis not present

## 2016-09-30 DIAGNOSIS — Z7982 Long term (current) use of aspirin: Secondary | ICD-10-CM | POA: Diagnosis not present

## 2016-09-30 DIAGNOSIS — J45909 Unspecified asthma, uncomplicated: Secondary | ICD-10-CM | POA: Insufficient documentation

## 2016-09-30 DIAGNOSIS — Z79899 Other long term (current) drug therapy: Secondary | ICD-10-CM | POA: Diagnosis not present

## 2016-09-30 LAB — CBC
HCT: 34.7 % — ABNORMAL LOW (ref 36.0–46.0)
Hemoglobin: 11.4 g/dL — ABNORMAL LOW (ref 12.0–15.0)
MCH: 28.1 pg (ref 26.0–34.0)
MCHC: 32.9 g/dL (ref 30.0–36.0)
MCV: 85.5 fL (ref 78.0–100.0)
PLATELETS: 266 10*3/uL (ref 150–400)
RBC: 4.06 MIL/uL (ref 3.87–5.11)
RDW: 13.3 % (ref 11.5–15.5)
WBC: 11.3 10*3/uL — ABNORMAL HIGH (ref 4.0–10.5)

## 2016-09-30 LAB — BASIC METABOLIC PANEL
Anion gap: 8 (ref 5–15)
BUN: 9 mg/dL (ref 6–20)
CALCIUM: 8.2 mg/dL — AB (ref 8.9–10.3)
CO2: 26 mmol/L (ref 22–32)
CREATININE: 0.54 mg/dL (ref 0.44–1.00)
Chloride: 106 mmol/L (ref 101–111)
GFR calc Af Amer: >60 mL/min (ref >60)
GLUCOSE: 122 mg/dL — AB (ref 65–99)
Potassium: 3.4 mmol/L — ABNORMAL LOW (ref 3.5–5.1)
Sodium: 140 mmol/L (ref 135–145)

## 2016-09-30 LAB — I-STAT TROPONIN, ED: TROPONIN I, POC: 0 ng/mL (ref 0.00–0.08)

## 2016-09-30 MED ORDER — KETOROLAC TROMETHAMINE 30 MG/ML IJ SOLN
30.0000 mg | Freq: Once | INTRAMUSCULAR | Status: AC
  Administered 2016-10-01: 30 mg via INTRAMUSCULAR
  Filled 2016-09-30: qty 1

## 2016-09-30 NOTE — ED Triage Notes (Addendum)
Pt c/o center chest pain that squeezes and rolls over to left chest area onset yesterday. Pt also c/o headache. Pt had thyroid surgery on Friday

## 2016-09-30 NOTE — ED Provider Notes (Signed)
[facility] DEPT Provider Note   CSN: [id number] Arrival date & time: 09/30/16  1639   By signing my name below, I, Delton Prairie, attest that this documentation has been prepared under the direction and in the presence of Merryl Hacker, MD  Electronically Signed: Delton Prairie, ED Scribe. 09/30/16. 11:43 PM.   History   Chief Complaint Chief Complaint  Patient presents with  . Chest Pain    HPI Comments:  Felicia Solis is a 51 y.o. female, with a PMHx of hyperlipidemia, high cholesterol and DM, who presents to the Emergency Department complaining of acute onset, intermittent, "7/10" chest pain beginning yesterday night. She also reports a cough, fever and nausea. Her pain is worse with coughing. She notes a family hx of heart disease and notes her mother had stents placed when she was in her 38s. No alleviating factors noted. Pt denies leg swelling, SOB, vomiting, diaphoresis or any other associated symptoms. Pt also denies a hx of blood clots and a hx of similar symptoms. Per chart review, pt has a completion of thyroidectomy by Dr. Izora Gala on on 09/28/2016. No other complaints noted at this time.   The history is provided by the patient. No language interpreter was used.    Past Medical History:  Diagnosis Date  . Anemia    anemia  when pregnant in 2002  . Anxiety   . Arthritis    "knees and back" (09/28/2016)  . Asthma 5 yrs ago    told she had asthma.. does not remember doctors name.   . Chronic lower back pain   . Depression    takes citalopram  . History of hiatal hernia   . History of kidney stones   . Hyperlipidemia   . Hypothyroidism   . LBBB (left bundle branch block)    rate-related; identified on stress test 03/21/16  . Migraine    "weekly" (09/28/2016)  . 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 Shortness of breath    with exertion  . Thyroid disease   . Type II diabetes mellitus ([place]) dx'd 2016     Patient Active Problem List   Diagnosis Date Noted  . S/P complete thyroidectomy 09/28/2016  . Memory loss 08/23/2016  . Thyroid nodule 08/02/2016  . Plantar fasciitis of right foot 12/01/2015  . Knee pain, right 12/01/2015  . Headache, migraine 09/06/2015  . Bilateral knee pain 08/22/2015  . Thoracic myofascial strain 08/22/2015  . Dysphagia 04/10/2015  . Atypical chest pain 04/10/2015  . Epigastric pain 04/10/2015  . Functional diarrhea 04/10/2015  . NSAID long-term use 04/10/2015  . Midepigastric pain 03/22/2015  . Cough 01/07/2015  . Allergic rhinitis 12/13/2014  . Acute bronchitis 12/13/2014  . Blood in stool 10/01/2014  . Diabetes mellitus type II, uncontrolled ([place]) 08/13/2014  . Abnormal ECG 05/14/2012  . Chest pain 10/23/2011  . ASCUS on Pap smear 10/08/2011  . H/O thyroid nodule 08/27/2011  . Depression with anxiety 08/27/2011  . Heavy periods 08/27/2011    Past Surgical History:  Procedure Laterality Date  . DILATION AND CURETTAGE OF UTERUS  1999  . ENDOMETRIAL ABLATION    . LAPAROSCOPY     years ago ..pt does not know where surgery was done ... states" my stomach would blow up then go down"  . THYROIDECTOMY  09/28/2011   Procedure: THYROIDECTOMY;  Surgeon: Izora Gala, MD;  Location: [facility];  Service: ENT;  Laterality: Right;  RIGHT THYROIDECTOMY WITH FROZEN SECTION  . THYROIDECTOMY  09/28/2016   completion of thyroidectomy/notes 09/28/2016  . TUBAL LIGATION      OB History    Gravida Para Term Preterm AB Living   5 3 3   2 3    SAB TAB Ectopic Multiple Live Births   2               Home Medications    Prior to Admission medications   Medication Sig Start Date End Date Taking? Authorizing Provider  acetaminophen (TYLENOL) 500 MG tablet Take 1,000 mg by mouth every 6 (six) hours as needed for mild pain.   Yes Historical Provider, MD  aspirin EC 81 MG tablet Take 1 tablet (81 mg total) by mouth daily. 03/07/16  Yes Nelva Bush, MD  Biotin 5000 MCG  CAPS Take 5,000 mcg by mouth daily.    Yes Historical Provider, MD  Cholecalciferol (VITAMIN D3) 2000 units TABS Take 2,000 Units by mouth daily.    Yes Historical Provider, MD  citalopram (CELEXA) 20 MG tablet Take 1 tablet (20 mg total) by mouth daily. 05/28/16  Yes Edward Saguier, PA-C  cyanocobalamin 500 MCG tablet Take 500 mcg by mouth daily.   Yes Historical Provider, MD  cyclobenzaprine (FLEXERIL) 10 MG tablet Take 1 tablet (10 mg total) by mouth at bedtime. Patient taking differently: Take 10 mg by mouth daily as needed for muscle spasms.  08/21/16  Yes Yvonne R Lowne Chase, DO  diclofenac (VOLTAREN) 75 MG EC tablet Take 1 tablet (75 mg total) by mouth 2 (two) times daily. Patient taking differently: Take 75 mg by mouth 2 (two) times daily as needed for moderate pain.  08/21/16  Yes Yvonne R Lowne Chase, DO  Flaxseed, Linseed, (FLAXSEED OIL PO) Take 1,300 mg by mouth daily.   Yes Historical Provider, MD  fluticasone (FLONASE) 50 MCG/ACT nasal spray Place 2 sprays into both nostrils daily. Patient taking differently: Place 2 sprays into both nostrils daily as needed for allergies.  05/28/16  Yes Edward Saguier, PA-C  Ginkgo Biloba Extract 60 MG CAPS Take 60 mg by mouth daily.   Yes Historical Provider, MD  HYDROcodone-acetaminophen (NORCO) 7.5-325 MG tablet Take 1 tablet by mouth every 6 (six) hours as needed for moderate pain. 09/28/16  Yes Izora Gala, MD  levothyroxine (SYNTHROID) 100 MCG tablet Take 1 tablet (100 mcg total) by mouth daily. 09/28/16  Yes Izora Gala, MD  losartan (COZAAR) 25 MG tablet Take 1 tablet (25 mg total) by mouth daily. 08/23/16  Yes Yvonne R Lowne Chase, DO  Omega-3 Fatty Acids (FISH OIL) 1200 MG CAPS Take 1,200 mg by mouth daily.   Yes Historical Provider, MD  pantoprazole (PROTONIX) 40 MG tablet Take 1 tablet (40 mg total) by mouth daily. 07/18/16  Yes Edward Saguier, PA-C  promethazine (PHENERGAN) 25 MG suppository Place 1 suppository (25 mg total) rectally every 6 (six)  hours as needed for nausea or vomiting. 09/28/16  Yes Izora Gala, MD  simvastatin (ZOCOR) 40 MG tablet Take 1 tablet (40 mg total) by mouth at bedtime. 08/23/16  Yes Yvonne R Lowne Chase, DO  SitaGLIPtin-MetFORMIN HCl (JANUMET XR) [phone] MG TB24 Take 1 tablet by mouth daily. 08/23/16  Yes Rosalita Chessman Chase, DO  glucose blood (ONETOUCH VERIO) test strip [facility] Check blood sugar twice daily 08/26/14   Rosalita Chessman Chase, DO  naproxen (NAPROSYN) 500 MG tablet Take 1 tablet (500 mg total) by mouth 2 (two) times  daily. 10/01/16   Merryl Hacker, MD  [facility] DELICA LANCETS FINE MISC Check blood sugar twice daily 08/26/14   [name], DO    Family History Family History  Problem Relation Age of Onset  . Diabetes Mother   . Hypertension Mother   . Hyperlipidemia Mother   . Gallbladder disease Mother   . Heart disease Mother   . Diabetes Sister   . Hypertension Father   . Hyperlipidemia Father   . Colon polyps Father   . Ovarian cancer Maternal Grandmother   . Gallbladder disease Maternal Grandmother   . Colon cancer Paternal Grandfather   . Breast cancer Maternal Aunt   . Heart disease Other     early cad  . Diabetes Brother   . Heart attack Brother   . Anesthesia problems Neg Hx   . Hypotension Neg Hx   . Malignant hyperthermia Neg Hx   . Pseudochol deficiency Neg Hx   . Esophageal cancer Neg Hx     Social History Social History  Substance Use Topics  . Smoking status: Never Smoker  . Smokeless tobacco: Never Used  . Alcohol use No     Allergies   Lisinopril   Review of Systems Review of Systems  Constitutional: Positive for fever. Negative for diaphoresis.  Respiratory: Negative for cough.   Cardiovascular: Positive for chest pain. Negative for leg swelling.  Gastrointestinal: Positive for nausea. Negative for vomiting.  All other systems reviewed and are negative.    Physical Exam Updated Vital Signs BP (!) 115/56 (BP Location: Left Arm)    Pulse 73   Temp 99.8 F (37.7 C) (Oral)   Resp 17   Ht 5' 6.5" (1.689 m)   Wt 230 lb (104.3 kg)   SpO2 99%   BMI 36.57 kg/m   Physical Exam  Constitutional: She is oriented to person, place, and time. She appears well-developed and well-nourished. No distress.  Overweight  HENT:  Head: Normocephalic and atraumatic.  Eyes: Pupils are equal, round, and reactive to light.  Neck:  Horizontal incision over the lower neck clean dry and intact, mild swelling, no erythema, no tenderness to palpation  Cardiovascular: Normal rate, regular rhythm and normal heart sounds.   Pulmonary/Chest: Effort normal and breath sounds normal. No respiratory distress. She has no wheezes. She exhibits no tenderness.  Abdominal: Soft. Bowel sounds are normal. There is no tenderness. There is no guarding.  Musculoskeletal: She exhibits no edema.  Neurological: She is alert and oriented to person, place, and time.  Skin: Skin is warm and dry.  Psychiatric: She has a normal mood and affect.  Nursing note and vitals reviewed.    ED Treatments / Results  DIAGNOSTIC STUDIES:  Oxygen Saturation is 96% on RA, normal by my interpretation.    COORDINATION OF CARE:  11:30 PM Discussed treatment plan with pt at bedside and pt agreed to plan.  Labs (all labs ordered are listed, but only abnormal results are displayed) Labs Reviewed  BASIC METABOLIC PANEL - Abnormal; Notable for the following:       Result Value   Potassium 3.4 (*)    Glucose, Bld 122 (*)    Calcium 8.2 (*)    All other components within normal limits  CBC - Abnormal; Notable for the following:    WBC 11.3 (*)    Hemoglobin 11.4 (*)    HCT 34.7 (*)    All other components within normal limits  D-DIMER, QUANTITATIVE (NOT AT [facility]) -  Abnormal; Notable for the following:    D-Dimer, Quant 1.37 (*)    All other components within normal limits  I-STAT TROPOININ, ED    EKG  EKG Interpretation  Date/Time:  Sunday September 30 2016 16:44:46  EDT Ventricular Rate:  87 PR Interval:  142 QRS Duration: 84 QT Interval:  376 QTC Calculation: 452 R Axis:   92 Text Interpretation:  Normal sinus rhythm Rightward axis Borderline ECG Nonspecific T waves in inferior leads Confirmed by Laquida Cotrell  MD, [place] ([id number]) on 09/30/2016 11:12:18 PM       Radiology Dg Chest 2 View  Result Date: 09/30/2016 CLINICAL DATA:  Chest pain, headache, postop thyroidectomy on 04/06 EXAM: CHEST  2 VIEW COMPARISON:  09/28/2016 FINDINGS: Low lung volumes. Mild patchy bilateral lower lobe opacities, likely atelectasis. No pleural effusion or pneumothorax. The heart is normal in size. Mild degenerative changes of the visualized thoracolumbar spine. IMPRESSION: Low lung volumes with mild bilateral lower lobe atelectasis. Electronically Signed   By: Julian Hy M.D.   On: 09/30/2016 18:03   Ct Angio Chest Pe W And/or Wo Contrast  Result Date: 10/01/2016 CLINICAL DATA:  Positive D-dimer, asthma, positive TB test. EXAM: CT ANGIOGRAPHY CHEST WITH CONTRAST TECHNIQUE: Multidetector CT imaging of the chest was performed using the standard protocol during bolus administration of intravenous contrast. Multiplanar CT image reconstructions and MIPs were obtained to evaluate the vascular anatomy. CONTRAST:  80 cc Isovue 370 IV COMPARISON:  06/22/2014 CXR, 09/30/2016 CXR FINDINGS: Cardiovascular: Satisfactory opacification of the pulmonary arteries to the segmental level. No evidence of pulmonary embolism. Normal heart size. No pericardial effusion. Mediastinum/Nodes: Deviation of the trachea to the right secondary to left thyroid lobe enlargement. No discrete nodule is identified. This appears to be a chronic finding and deviation of the trachea dates back to a 06/22/2014 CXR. No mediastinal or hilar lymphadenopathy. Small hiatal hernia. The thoracic esophagus is otherwise unremarkable. Trachea and mainstem bronchi are patent. Lungs/Pleura: No pneumonic consolidation or dominant  mass. Lingular and bibasilar subsegmental atelectasis and/or scarring is seen. No effusion or pneumothorax. Upper Abdomen: Hepatic steatosis.  No biliary dilatation. Musculoskeletal: No chest wall abnormality. No acute or significant osseous findings. Mild degenerative changes are seen along the mid thoracic spine. Paraspinal vascular collaterals are seen incidentally in the posterior upper thorax. Review of the MIP images confirms the above findings. IMPRESSION: No acute pulmonary embolus, aortic dissection or aneurysm. No acute cardiopulmonary disease. Enlarged thyroid gland which dates back to 2015 with deviation the trachea convex to the right. No discrete mass or nodules are seen of the included thyroid. Electronically Signed   By: Ashley Royalty M.D.   On: 10/01/2016 02:32    Procedures Procedures (including critical care time)  Medications Ordered in ED Medications  ketorolac (TORADOL) 30 MG/ML injection 30 mg (30 mg Intramuscular Given 10/01/16 0009)  iopamidol (ISOVUE-370) 76 % injection (100 mLs  Contrast Given 10/01/16 0207)     Initial Impression / Assessment and Plan / ED Course  I have reviewed the triage vital signs and the nursing notes.  Pertinent labs & imaging results that were available during my care of the patient were reviewed by me and considered in my medical decision making (see chart for details).     Patient presents with chest pain. Somewhat atypical in nature. Recent surgery. She also has risk factors for ACS. EKG is nonischemic. Unfortunately her initial troponin was not sent. Troponin at greater than 6 hours into her stay was 0. Doubt ACS.  Given age and recent surgery, screening d-dimer was sent. This was elevated. Will obtain CT scan. Patient is improved after Toradol. CT chest without evidence of PE. On recheck, patient is stable and feels much better. Will discharge home. Recommend cardiology follow-up given risk factors.  After history, exam, and medical workup I  feel the patient has been appropriately medically screened and is safe for discharge home. Pertinent diagnoses were discussed with the patient. Patient was given return precautions.   Final Clinical Impressions(s) / ED Diagnoses   Final diagnoses:  Atypical chest pain    New Prescriptions New Prescriptions   NAPROXEN (NAPROSYN) 500 MG TABLET    Take 1 tablet (500 mg total) by mouth 2 (two) times daily.  * I personally performed the services described in this documentation, which was scribed in my presence. The recorded information has been reviewed and is accurate.     Merryl Hacker, MD 10/01/16 [phone]

## 2016-10-01 ENCOUNTER — Encounter (HOSPITAL_COMMUNITY): Payer: Self-pay | Admitting: Otolaryngology

## 2016-10-01 ENCOUNTER — Emergency Department (HOSPITAL_COMMUNITY): Payer: BLUE CROSS/BLUE SHIELD

## 2016-10-01 ENCOUNTER — Encounter: Payer: Self-pay | Admitting: Rehabilitative and Restorative Service Providers"

## 2016-10-01 DIAGNOSIS — J45909 Unspecified asthma, uncomplicated: Secondary | ICD-10-CM | POA: Diagnosis not present

## 2016-10-01 LAB — D-DIMER, QUANTITATIVE (NOT AT ARMC): D DIMER QUANT: 1.37 ug{FEU}/mL — AB (ref 0.00–0.50)

## 2016-10-01 MED ORDER — NAPROXEN 500 MG PO TABS: 500.0000 mg | ORAL_TABLET | Freq: Two times a day (BID) | ORAL | 0 refills | Status: DC

## 2016-10-01 MED ORDER — ACETAMINOPHEN 500 MG PO TABS
1000.0000 mg | ORAL_TABLET | Freq: Once | ORAL | Status: AC
  Administered 2016-10-01: 1000 mg via ORAL
  Filled 2016-10-01: qty 2

## 2016-10-01 MED ORDER — IOPAMIDOL (ISOVUE-370) INJECTION 76%
INTRAVENOUS | Status: AC
  Administered 2016-10-01: 100 mL
  Filled 2016-10-01: qty 100

## 2016-10-01 NOTE — ED Notes (Signed)
Advised CT that IV is in

## 2016-10-01 NOTE — Discharge Instructions (Signed)
You were seen today for chest pain. Your workup is reassuring. It may be related to recent surgery. Follow-up with cardiology given your risk factors. You may need stress testing. If you have any new or worsening symptoms she needs to be reevaluated.

## 2016-10-01 NOTE — ED Notes (Signed)
Pt departed in NAD, refused use of wheelchair.  

## 2016-10-04 ENCOUNTER — Encounter: Payer: BLUE CROSS/BLUE SHIELD | Admitting: Rehabilitative and Restorative Service Providers"

## 2016-10-10 ENCOUNTER — Ambulatory Visit (INDEPENDENT_AMBULATORY_CARE_PROVIDER_SITE_OTHER): Payer: BLUE CROSS/BLUE SHIELD | Admitting: Family Medicine

## 2016-10-10 ENCOUNTER — Encounter: Payer: Self-pay | Admitting: Family Medicine

## 2016-10-10 DIAGNOSIS — S29019D Strain of muscle and tendon of unspecified wall of thorax, subsequent encounter: Secondary | ICD-10-CM | POA: Diagnosis not present

## 2016-10-11 NOTE — Progress Notes (Signed)
PCP: [name], DO  Subjective:   HPI: Patient is a 51 y.o. female here for mid back pain.  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 She rushed in and lifted him up, transferred him to recliner then developed pain on right side of mid-back. Pain level up to 5/10, can be sharp. Worse with twisting motions. Tried tylenol, ibuprofen, heat, diclofenac, cyclobenzaprine. No radiation into extremities. No numbness or tingling. No bowel/bladder dysfunction.  2/20: Patient reports her back has improved some. She is taking diclofenac and flexeril. Working on [employer] duty. Still reports back pain thoracic area at 5/10 level though. Worse arching back. Doing home exercises, stretches. No skin changes, numbness.  4/18: Patient reports she has finished PT and pain much better. She has been resting from this and home exercises though following her thyroidectomy about 2 weeks ago. Pain is 0/10. Occasionally taking nsaid or muscle relaxant. No radiation of pain. No numbness, skin changes.  Past Medical History:  Diagnosis Date  . Anemia    anemia  when pregnant in 2002  . Anxiety   . Arthritis    "knees and back" (09/28/2016)  . Asthma 5 yrs ago    told she had asthma.. does not remember doctors name.   . Chronic lower back pain   . Depression    takes citalopram  . History of hiatal hernia   . History of kidney stones   . Hyperlipidemia   . Hypothyroidism   . LBBB (left bundle branch block)    rate-related; identified on stress test 03/21/16  . Migraine    "weekly" (09/28/2016)  . 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 Shortness of breath    with exertion  . Thyroid disease   . Type II diabetes mellitus ([place]) dx'd 2016    Current Outpatient Prescriptions on File Prior to Visit   Medication Sig Dispense Refill  . acetaminophen (TYLENOL) 500 MG tablet Take 1,000 mg by mouth every 6 (six) hours as needed for mild pain.    Marland Kitchen aspirin EC 81 MG tablet Take 1 tablet (81 mg total) by mouth daily. 90 tablet 3  . Biotin 5000 MCG CAPS Take 5,000 mcg by mouth daily.     . Cholecalciferol (VITAMIN D3) 2000 units TABS Take 2,000 Units by mouth daily.     . citalopram (CELEXA) 20 MG tablet Take 1 tablet (20 mg total) by mouth daily. 30 tablet 11  . cyanocobalamin 500 MCG tablet Take 500 mcg by mouth daily.    . cyclobenzaprine (FLEXERIL) 10 MG tablet Take 1 tablet (10 mg total) by mouth at bedtime. (Patient taking differently: Take 10 mg by mouth daily as needed for muscle spasms. ) 10 tablet 0  . diclofenac (VOLTAREN) 75 MG EC tablet Take 1 tablet (75 mg total) by mouth 2 (two) times daily. (Patient taking differently: Take 75 mg by mouth 2 (two) times daily as needed for moderate pain. ) 30 tablet 0  . Flaxseed, Linseed, (FLAXSEED OIL PO) Take 1,300 mg by mouth daily.    . fluticasone (FLONASE) 50 MCG/ACT nasal spray Place 2 sprays into both nostrils daily. (Patient taking differently: Place 2 sprays into both nostrils daily as needed for allergies. ) 16 g 1  . Ginkgo Biloba  Extract 60 MG CAPS Take 60 mg by mouth daily.    Marland Kitchen glucose blood (ONETOUCH VERIO) test strip Onetouch Verio Check blood sugar twice daily 100 each 12  . HYDROcodone-acetaminophen (NORCO) 7.5-325 MG tablet Take 1 tablet by mouth every 6 (six) hours as needed for moderate pain. 20 tablet 0  . levothyroxine (SYNTHROID) 100 MCG tablet Take 1 tablet (100 mcg total) by mouth daily. 30 tablet 6  . losartan (COZAAR) 25 MG tablet Take 1 tablet (25 mg total) by mouth daily. 30 tablet 5  . naproxen (NAPROSYN) 500 MG tablet Take 1 tablet (500 mg total) by mouth 2 (two) times daily. 30 tablet 0  . Omega-3 Fatty Acids (FISH OIL) 1200 MG CAPS Take 1,200 mg by mouth daily.    Glory Roseb[facility] DELICA LANCETS FINE MISC Check blood sugar  twice daily 100 each 12  . pantoprazole (PROTONIX) 40 MG tablet Take 1 tablet (40 mg total) by mouth daily. 30 tablet 3  . promethazine (PHENERGAN) 25 MG suppository Place 1 suppository (25 mg total) rectally every 6 (six) hours as needed for nausea or vomiting. 12 suppository 1  . simvastatin (ZOCOR) 40 MG tablet Take 1 tablet (40 mg total) by mouth at bedtime. 30 tablet 2  . SitaGLIPtin-MetFORMIN HCl (JANUMET XR) [phone] MG TB24 Take 1 tablet by mouth daily. 60 tablet 2   No current facility-administered medications on file prior to visit.     Past Surgical History:  Procedure Laterality Date  . DILATION AND CURETTAGE OF UTERUS  1999  . ENDOMETRIAL ABLATION    . LAPAROSCOPY     years ago ..pt does not know where surgery was done ... states" my stomach would blow up then go down"  . THYROIDECTOMY  09/28/2011   Procedure: THYROIDECTOMY;  Surgeon: Izora Gala, MD;  Location: [facility];  Service: ENT;  Laterality: Right;  RIGHT THYROIDECTOMY WITH FROZEN SECTION  . THYROIDECTOMY  09/28/2016   completion of thyroidectomy/notes 09/28/2016  . THYROIDECTOMY Left 09/28/2016   Procedure: COMPLETION OF THYROIDECTOMY;  Surgeon: Izora Gala, MD;  Location: [facility];  Service: ENT;  Laterality: Left;  . TUBAL LIGATION      Allergies  Allergen Reactions  . Lisinopril Cough    Social History   Social History  . Marital status: Married    Spouse name: N/A  . Number of children: 3  . Years of education: N/A   Occupational History  . CNA    Social History Main Topics  . Smoking status: Never Smoker  . Smokeless tobacco: Never Used  . Alcohol use No  . Drug use: No  . Sexual activity: Yes    Partners: Male    Birth control/ protection: Surgical   Other Topics Concern  . Not on file   Social History Narrative   No exercise--  Walking and lifting pts at work    Family History  Problem Relation Age of Onset  . Diabetes Mother   . Hypertension Mother   . Hyperlipidemia Mother   .  Gallbladder disease Mother   . Heart disease Mother   . Diabetes Sister   . Hypertension Father   . Hyperlipidemia Father   . Colon polyps Father   . Ovarian cancer Maternal Grandmother   . Gallbladder disease Maternal Grandmother   . Colon cancer Paternal Grandfather   . Breast cancer Maternal Aunt   . Heart disease Other     early cad  . Diabetes Brother   . Heart attack Brother   .  Anesthesia problems Neg Hx   . Hypotension Neg Hx   . Malignant hyperthermia Neg Hx   . Pseudochol deficiency Neg Hx   . Esophageal cancer Neg Hx     BP 138/81   Pulse 75   Ht 5\' 7"  (1.702 m)   Wt 220 lb (99.8 kg)   BMI 34.46 kg/m   Review of Systems: See HPI above.     Objective:  Physical Exam:  Gen: NAD, comfortable in exam room  Back: No gross deformity, scoliosis. TTP just left and right thoracic paraspinal region.  No midline or bony TTP. FROM with mild pain on scapular squeeze. Strength 5/5 all muscle groups upper extremities.  Assessment & Plan:  1. 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  Clinically improved with physical therapy, home exercises.  Encouraged home exercises 2-3 times a week for 6 more weeks.  Diclofenac and flexeril as needed.  Cleared for full duty.  F/u prn.

## 2016-10-11 NOTE — Assessment & Plan Note (Signed)
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  Clinically improved with physical therapy, home exercises.  Encouraged home exercises 2-3 times a week for 6 more weeks.  Diclofenac and flexeril as needed.  Cleared for full duty.  F/u prn.

## 2016-10-12 ENCOUNTER — Ambulatory Visit: Payer: Self-pay

## 2016-11-26 ENCOUNTER — Other Ambulatory Visit: Payer: BLUE CROSS/BLUE SHIELD

## 2016-11-30 NOTE — Addendum Note (Signed)
Addendum  created 11/30/16 1143 by Lyn Hollingshead, MD   Sign clinical note

## 2016-12-06 ENCOUNTER — Encounter: Payer: Self-pay | Admitting: Family Medicine

## 2016-12-06 ENCOUNTER — Ambulatory Visit (INDEPENDENT_AMBULATORY_CARE_PROVIDER_SITE_OTHER): Payer: BLUE CROSS/BLUE SHIELD | Admitting: Family Medicine

## 2016-12-06 VITALS — BP 150/90 | HR 66 | Temp 98.4°F | Ht 67.0 in | Wt 228.2 lb

## 2016-12-06 DIAGNOSIS — R030 Elevated blood-pressure reading, without diagnosis of hypertension: Secondary | ICD-10-CM

## 2016-12-06 DIAGNOSIS — J0110 Acute frontal sinusitis, unspecified: Secondary | ICD-10-CM | POA: Diagnosis not present

## 2016-12-06 MED ORDER — BENZONATATE 100 MG PO CAPS: 100.0000 mg | ORAL_CAPSULE | Freq: Three times a day (TID) | ORAL | 0 refills | Status: DC | PRN

## 2016-12-06 MED ORDER — NAPROXEN 500 MG PO TABS: 500.0000 mg | ORAL_TABLET | Freq: Two times a day (BID) | ORAL | 0 refills | Status: DC

## 2016-12-06 NOTE — Progress Notes (Signed)
Chief Complaint  Patient presents with  . URI    cough(dry),blewing-yellow mucus,h/a,st-sxs started on yest  . Excessive Sweating    Felicia Solis here for URI complaints.  Duration: 1 day  Associated symptoms: ST, R ear pain, rhinorrhea, HA, and dry cough Denies: sinus pain, itchy watery eyes, ear pain, ear drainage, sore throat, shortness of breath and fevers/rigors Treatment to date: Vick's VaPorub , acetaminophen,  Sick contacts: No  ROS:  Const: Denies fevers HEENT: As noted in HPI Lungs: No SOB  Past Medical History:  Diagnosis Date  . Anemia    anemia  when pregnant in 2002  . Anxiety   . Arthritis    "knees and back" (09/28/2016)  . Asthma 5 yrs ago    told she had asthma.. does not remember doctors name.   . Chronic lower back pain   . Depression    takes citalopram  . History of hiatal hernia   . History of kidney stones   . Hyperlipidemia   . Hypothyroidism   . LBBB (left bundle branch block)    rate-related; identified on stress test 03/21/16  . Migraine    "weekly" (09/28/2016)  . 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 Shortness of breath    with exertion  . Thyroid disease   . Type II diabetes mellitus ([place]) dx'd 2016   Family History  Problem Relation Age of Onset  . Diabetes Mother   . Hypertension Mother   . Hyperlipidemia Mother   . Gallbladder disease Mother   . Heart disease Mother   . Diabetes Sister   . Hypertension Father   . Hyperlipidemia Father   . Colon polyps Father   . Ovarian cancer Maternal Grandmother   . Gallbladder disease Maternal Grandmother   . Colon cancer Paternal Grandfather   . Breast cancer Maternal Aunt   . Heart disease Other        early cad  . Diabetes Brother   . Heart attack Brother   . Anesthesia problems Neg Hx   . Hypotension Neg Hx   . Malignant hyperthermia Neg Hx   . Pseudochol deficiency Neg Hx   . Esophageal cancer Neg Hx     BP (!) 150/90  (BP Location: Left Arm, Patient Position: Sitting, Cuff Size: Normal)   Pulse 66   Temp 98.4 F (36.9 C) (Oral)   Ht 5\' 7"  (1.702 m)   Wt 228 lb 3.2 oz (103.5 kg)   SpO2 98%   BMI 35.74 kg/m  General: Awake, alert, appears stated age HEENT: AT, [place], ears patent b/l and TM's neg, nares patent w/o discharge, mild TTP over R frontal sinus, pharynx pink and without exudates, MMM Neck: No masses or asymmetry Heart: RRR, no murmurs, no bruits Lungs: CTAB, no accessory muscle use Psych: Age appropriate judgment and insight, normal mood and affect  Acute non-recurrent frontal sinusitis - Plan: naproxen (NAPROSYN) 500 MG tablet, benzonatate (TESSALON) 100 MG capsule  Elevated blood pressure reading  Orders as above.  Continue to push fluids, practice good hand hygiene, cover mouth when coughing. Brought up sweating at the end. Recommended some OTC menopause symptom remedies and follow up with reg PCP in 2 weeks If starting to experience fevers, shaking, or shortness of breath, seek immediate care. Pt voiced understanding and agreement to the plan.  [place], DO 12/06/16 7:55 AM

## 2016-12-06 NOTE — Patient Instructions (Addendum)
Continue to push fluids, practice good hand hygiene, and cover your mouth if you cough.  If you start having fevers, shaking or shortness of breath, seek immediate care.  Start drinking soy milk and take some black cohosh supplement. This is available over the counter. If your sweats improve, cancel appointment- give [place] 24 hour notice.

## 2016-12-10 ENCOUNTER — Telehealth: Payer: Self-pay | Admitting: Family Medicine

## 2016-12-10 NOTE — Telephone Encounter (Addendum)
Relation to PK:GYBN Call back number:[phone] Pharmacy: [facility], [place] [phone] (Phone) [phone] (Fax)     Reason for call:  Patient states benzonatate (TESSALON) 100 MG capsule is not working for her cough, requesting alternate, please advise

## 2016-12-12 MED ORDER — PROMETHAZINE-DM 6.25-15 MG/5ML PO SYRP: 5.0000 mL | ORAL_SOLUTION | Freq: Four times a day (QID) | ORAL | 0 refills | Status: DC | PRN

## 2016-12-12 NOTE — Telephone Encounter (Signed)
Tessalon Perles removed, cough syrup called in. TY.

## 2016-12-12 NOTE — Telephone Encounter (Signed)
Pt aware.//AB/CMA 

## 2017-01-13 ENCOUNTER — Encounter (HOSPITAL_BASED_OUTPATIENT_CLINIC_OR_DEPARTMENT_OTHER): Payer: Self-pay | Admitting: Emergency Medicine

## 2017-01-13 ENCOUNTER — Emergency Department (HOSPITAL_BASED_OUTPATIENT_CLINIC_OR_DEPARTMENT_OTHER): Payer: Worker's Compensation

## 2017-01-13 ENCOUNTER — Emergency Department (HOSPITAL_BASED_OUTPATIENT_CLINIC_OR_DEPARTMENT_OTHER)
Admission: EM | Admit: 2017-01-13 | Discharge: 2017-01-13 | Disposition: A | Discharge status: Home / Self Care | Payer: Worker's Compensation | Attending: Emergency Medicine | Admitting: Emergency Medicine

## 2017-01-13 DIAGNOSIS — Z79899 Other long term (current) drug therapy: Secondary | ICD-10-CM | POA: Diagnosis not present

## 2017-01-13 DIAGNOSIS — M25561 Pain in right knee: Secondary | ICD-10-CM

## 2017-01-13 DIAGNOSIS — E039 Hypothyroidism, unspecified: Secondary | ICD-10-CM | POA: Diagnosis not present

## 2017-01-13 DIAGNOSIS — Z7984 Long term (current) use of oral hypoglycemic drugs: Secondary | ICD-10-CM | POA: Insufficient documentation

## 2017-01-13 DIAGNOSIS — J45909 Unspecified asthma, uncomplicated: Secondary | ICD-10-CM | POA: Insufficient documentation

## 2017-01-13 DIAGNOSIS — Z7982 Long term (current) use of aspirin: Secondary | ICD-10-CM | POA: Diagnosis not present

## 2017-01-13 DIAGNOSIS — M25512 Pain in left shoulder: Secondary | ICD-10-CM | POA: Insufficient documentation

## 2017-01-13 DIAGNOSIS — E119 Type 2 diabetes mellitus without complications: Secondary | ICD-10-CM | POA: Insufficient documentation

## 2017-01-13 DIAGNOSIS — W19XXXA Unspecified fall, initial encounter: Secondary | ICD-10-CM

## 2017-01-13 MED ORDER — NAPROXEN 500 MG PO TABS: 500.0000 mg | ORAL_TABLET | Freq: Two times a day (BID) | ORAL | 0 refills | Status: DC

## 2017-01-13 MED ORDER — METHOCARBAMOL 500 MG PO TABS: 500.0000 mg | ORAL_TABLET | Freq: Two times a day (BID) | ORAL | 0 refills | Status: DC

## 2017-01-13 MED ORDER — IBUPROFEN 800 MG PO TABS
800.0000 mg | ORAL_TABLET | Freq: Once | ORAL | Status: AC
  Administered 2017-01-13: 800 mg via ORAL
  Filled 2017-01-13: qty 1

## 2017-01-13 NOTE — ED Triage Notes (Signed)
Pt fell on Friday. Pt slipped on water. C/o R knee, L shoulder and lower back pain.

## 2017-01-13 NOTE — Discharge Instructions (Signed)
Your x-rays today did not show any acute fractures or dislocations. You likely have some muscle soreness that should improve over the next few days. Medicines I prescribed for you should help with this. Follow-up with your primary care doctor if any ongoing issues. Return here for any new concerns.

## 2017-01-13 NOTE — ED Provider Notes (Signed)
[facility] DEPT [facility] Provider Note   CSN: [id number] Arrival date & time: 01/13/17  1521  By signing my name below, I, Mayer Masker, attest that this documentation has been prepared under the direction and in the presence of Quincy Carnes, PA-C. Electronically Signed: Mayer Masker, Scribe. 01/13/17. 5:23 PM.  History   Chief Complaint Chief Complaint  Patient presents with  . Fall   The history is provided by the patient. No language interpreter was used.    HPI Comments: Felicia Solis is a 51 y.o. female who presents to the Emergency Department complaining of constant, gradually worsening right-sided knee and leg pain and left-sided shoulder and back pain s/p a fall that began prior to arrival. She states she was walking down the hall when she slipped on some water when she fell on her right knee, bent her right leg, and fell onto her left shoulder. Pt has NKDA.  No head injury or LOC.  Has not tried any intervention PTA.  Past Medical History:  Diagnosis Date  . Anemia    anemia  when pregnant in 2002  . Anxiety   . Arthritis    "knees and back" (09/28/2016)  . Asthma 5 yrs ago    told she had asthma.. does not remember doctors name.   . Chronic lower back pain   . Depression    takes citalopram  . History of hiatal hernia   . History of kidney stones   . Hyperlipidemia   . Hypothyroidism   . LBBB (left bundle branch block)    rate-related; identified on stress test 03/21/16  . Migraine    "weekly" (09/28/2016)  . 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 Shortness of breath    with exertion  . Thyroid disease   . Type II diabetes mellitus ([place]) dx'd 2016    Patient Active Problem List   Diagnosis Date Noted  . S/P complete thyroidectomy 09/28/2016  . Memory loss 08/23/2016  . Thyroid nodule 08/02/2016  . Plantar fasciitis of right foot 12/01/2015  . Knee pain, right 12/01/2015  . Headache, migraine 09/06/2015    . Bilateral knee pain 08/22/2015  . Thoracic myofascial strain 08/22/2015  . Dysphagia 04/10/2015  . Atypical chest pain 04/10/2015  . Epigastric pain 04/10/2015  . Functional diarrhea 04/10/2015  . NSAID long-term use 04/10/2015  . Midepigastric pain 03/22/2015  . Cough 01/07/2015  . Allergic rhinitis 12/13/2014  . Acute bronchitis 12/13/2014  . Blood in stool 10/01/2014  . Diabetes mellitus type II, uncontrolled ([place]) 08/13/2014  . Abnormal ECG 05/14/2012  . Chest pain 10/23/2011  . ASCUS on Pap smear 10/08/2011  . H/O thyroid nodule 08/27/2011  . Depression with anxiety 08/27/2011  . Heavy periods 08/27/2011    Past Surgical History:  Procedure Laterality Date  . DILATION AND CURETTAGE OF UTERUS  1999  . ENDOMETRIAL ABLATION    . LAPAROSCOPY     years ago ..pt does not know where surgery was done ... states" my stomach would blow up then go down"  . THYROIDECTOMY  09/28/2011   Procedure: THYROIDECTOMY;  Surgeon: Izora Gala, MD;  Location: [facility];  Service: ENT;  Laterality: Right;  RIGHT THYROIDECTOMY WITH FROZEN SECTION  . THYROIDECTOMY  09/28/2016   completion of thyroidectomy/notes 09/28/2016  . THYROIDECTOMY Left 09/28/2016   Procedure: COMPLETION OF THYROIDECTOMY;  Surgeon: Izora Gala, MD;  Location: [facility];  Service: ENT;  Laterality: Left;  . TUBAL LIGATION      OB History    Gravida Para Term Preterm AB Living   5 3 3   2 3    SAB TAB Ectopic Multiple Live Births   2               Home Medications    Prior to Admission medications   Medication Sig Start Date End Date Taking? Authorizing Provider  acetaminophen (TYLENOL) 500 MG tablet Take 1,000 mg by mouth every 6 (six) hours as needed for mild pain.    [provider]  aspirin EC 81 MG tablet Take 1 tablet (81 mg total) by mouth daily. 03/07/16   End, Harrell Gave, MD  Biotin 5000 MCG CAPS Take 5,000 mcg by mouth daily.     [provider]  Cholecalciferol (VITAMIN D3) 2000 units TABS Take  2,000 Units by mouth daily.     [provider]  citalopram (CELEXA) 20 MG tablet Take 1 tablet (20 mg total) by mouth daily. 05/28/16   Saguier, Percell Miller, PA-C  cyanocobalamin 500 MCG tablet Take 500 mcg by mouth daily.    [provider]  cyclobenzaprine (FLEXERIL) 10 MG tablet Take 1 tablet (10 mg total) by mouth at bedtime. Patient taking differently: Take 10 mg by mouth daily as needed for muscle spasms.  08/21/16   Roma Schanz R, DO  Flaxseed, Linseed, (FLAXSEED OIL PO) Take 1,300 mg by mouth daily.    [provider]  fluticasone (FLONASE) 50 MCG/ACT nasal spray Place 2 sprays into both nostrils daily. Patient taking differently: Place 2 sprays into both nostrils daily as needed for allergies.  05/28/16   Saguier, Percell Miller, PA-C  Ginkgo Biloba Extract 60 MG CAPS Take 60 mg by mouth daily.    [provider]  glucose blood (ONETOUCH VERIO) test strip Onetouch Verio Check blood sugar twice daily 08/26/14   Carollee Herter, Alferd Apa, DO  HYDROcodone-acetaminophen (NORCO) 7.5-325 MG tablet Take 1 tablet by mouth every 6 (six) hours as needed for moderate pain. 09/28/16   Izora Gala, MD  levothyroxine (SYNTHROID) 100 MCG tablet Take 1 tablet (100 mcg total) by mouth daily. 09/28/16   Izora Gala, MD  losartan (COZAAR) 25 MG tablet Take 1 tablet (25 mg total) by mouth daily. 08/23/16   [name], DO  naproxen (NAPROSYN) 500 MG tablet Take 1 tablet (500 mg total) by mouth 2 (two) times daily with a meal. 12/06/16   Wendling, Crosby Oyster, DO  Omega-3 Fatty Acids (FISH OIL) 1200 MG CAPS Take 1,200 mg by mouth daily.    [provider]  [facility] LANCETS FINE MISC Check blood sugar twice daily 08/26/14   Carollee Herter, Kendrick Fries R, DO  pantoprazole (PROTONIX) 40 MG tablet Take 1 tablet (40 mg total) by mouth daily. 07/18/16   Saguier, Percell Miller, PA-C  promethazine (PHENERGAN) 25 MG suppository Place 1 suppository (25 mg total) rectally every 6 (six) hours as  needed for nausea or vomiting. 09/28/16   Izora Gala, MD  promethazine-dextromethorphan (PROMETHAZINE-DM) 6.25-15 MG/5ML syrup Take 5 mLs by mouth 4 (four) times daily as needed for cough. 12/12/16   Shelda Pal, DO  simvastatin (ZOCOR) 40 MG tablet Take 1 tablet (40 mg total) by mouth at bedtime. 08/23/16   [name], DO  SitaGLIPtin-MetFORMIN HCl (JANUMET XR) [id number] MG TB24 Take 1 tablet by mouth daily. 08/23/16   [name], DO    Family History Family  History  Problem Relation Age of Onset  . Diabetes Mother   . Hypertension Mother   . Hyperlipidemia Mother   . Gallbladder disease Mother   . Heart disease Mother   . Diabetes Sister   . Hypertension Father   . Hyperlipidemia Father   . Colon polyps Father   . Ovarian cancer Maternal Grandmother   . Gallbladder disease Maternal Grandmother   . Colon cancer Paternal Grandfather   . Breast cancer Maternal Aunt   . Heart disease Other        early cad  . Diabetes Brother   . Heart attack Brother   . Anesthesia problems Neg Hx   . Hypotension Neg Hx   . Malignant hyperthermia Neg Hx   . Pseudochol deficiency Neg Hx   . Esophageal cancer Neg Hx     Social History Social History  Substance Use Topics  . Smoking status: Never Smoker  . Smokeless tobacco: Never Used  . Alcohol use No     Allergies   Lisinopril   Review of Systems Review of Systems  Musculoskeletal: Positive for arthralgias, back pain and myalgias.  All other systems reviewed and are negative.    Physical Exam Updated Vital Signs BP 130/73 (BP Location: Left Arm)   Pulse 82   Temp 98.2 F (36.8 C) (Oral)   Resp 19   Ht 5\' 6"  (1.676 m)   Wt 228 lb (103.4 kg)   SpO2 100%   BMI 36.80 kg/m   Physical Exam  Constitutional: She is oriented to person, place, and time. She appears well-developed and well-nourished.  HENT:  Head: Normocephalic and atraumatic.  Mouth/Throat: Oropharynx is clear and moist.  Eyes:  Pupils are equal, round, and reactive to light. Conjunctivae and EOM are normal.  Neck: Normal range of motion.  Cardiovascular: Normal rate, regular rhythm and normal heart sounds.   Pulmonary/Chest: Effort normal and breath sounds normal. No respiratory distress. She has no wheezes.  Abdominal: Soft. Bowel sounds are normal. There is no tenderness. There is no rebound.  Musculoskeletal: Normal range of motion.       Left shoulder: She exhibits tenderness, bony tenderness, pain and spasm.       Right knee: She exhibits normal range of motion, no swelling, no effusion, no ecchymosis and no deformity. Tenderness found. Lateral joint line tenderness noted.       Lumbar back: She exhibits tenderness, pain and spasm. She exhibits normal range of motion and no bony tenderness.       Back:       Arms:      Legs: Neurological: She is alert and oriented to person, place, and time.  Skin: Skin is warm and dry.  Psychiatric: She has a normal mood and affect.  Nursing note and vitals reviewed.    ED Treatments / Results  DIAGNOSTIC STUDIES: Oxygen Saturation is 100% on RA, normal by my interpretation.    COORDINATION OF CARE: 5:23 PM Discussed treatment plan with pt at bedside and pt agreed to plan.  Labs (all labs ordered are listed, but only abnormal results are displayed) Labs Reviewed - No data to display  EKG  EKG Interpretation None       Radiology Dg Shoulder Left  Result Date: 01/13/2017 CLINICAL DATA:  Shoulder pain after a fall. EXAM: LEFT SHOULDER - 2+ VIEW COMPARISON:  None. FINDINGS: There is no evidence of fracture or dislocation. There is no evidence of arthropathy or other focal bone abnormality. Soft tissues  are unremarkable. IMPRESSION: Negative. Electronically Signed   By: Misty Stanley M.D.   On: 01/13/2017 18:26   Dg Knee Complete 4 Views Right  Result Date: 01/13/2017 CLINICAL DATA:  Patient fell on Friday.  Now with knee pain. EXAM: RIGHT KNEE - COMPLETE 4+  VIEW COMPARISON:  None. FINDINGS: No evidence of fracture, dislocation, or joint effusion. No evidence of arthropathy or other focal bone abnormality. Soft tissues are unremarkable. IMPRESSION: Negative. Electronically Signed   By: Misty Stanley M.D.   On: 01/13/2017 17:50    Procedures Procedures (including critical care time)  Medications Ordered in ED Medications - No data to display   Initial Impression / Assessment and Plan / ED Course  I have reviewed the triage vital signs and the nursing notes.  Pertinent labs & imaging results that were available during my care of the patient were reviewed by me and considered in my medical decision making (see chart for details).  51 y.o. F here after fall in wet water at work.  No head injury or LOC.  Reports left back pain, left shoulder, and right knee pain.  No acute deformities noted on exam.  No neurologic deficits.  Screening x-rays obtained, no acute findings.  Suspect muscular type pain.  Will d/c home with supportive care.  Can follow-up closely with PCP if any acute changes/concerns.  Discussed plan with patient, she acknowledged understanding and agreed with plan of care.  Return precautions given for new or worsening symptoms.  Final Clinical Impressions(s) / ED Diagnoses   Final diagnoses:  Fall, initial encounter  Acute pain of left shoulder  Acute pain of right knee    New Prescriptions Discharge Medication List as of 01/13/2017  7:19 PM    START taking these medications   Details  methocarbamol (ROBAXIN) 500 MG tablet Take 1 tablet (500 mg total) by mouth 2 (two) times daily., Starting Sun 01/13/2017, Print       I personally performed the services described in this documentation, which was scribed in my presence. The recorded information has been reviewed and is accurate.   Larene Pickett, PA-C 01/13/17 1933    Davonna Belling, MD 01/13/17 [phone]

## 2017-02-15 ENCOUNTER — Other Ambulatory Visit: Payer: Self-pay | Admitting: Family Medicine

## 2017-02-15 ENCOUNTER — Telehealth: Payer: Self-pay | Admitting: Family Medicine

## 2017-02-15 ENCOUNTER — Ambulatory Visit: Payer: Self-pay | Admitting: Family Medicine

## 2017-02-15 DIAGNOSIS — E1165 Type 2 diabetes mellitus with hyperglycemia: Principal | ICD-10-CM

## 2017-02-15 DIAGNOSIS — E1151 Type 2 diabetes mellitus with diabetic peripheral angiopathy without gangrene: Secondary | ICD-10-CM

## 2017-02-15 DIAGNOSIS — IMO0002 Reserved for concepts with insufficient information to code with codable children: Secondary | ICD-10-CM

## 2017-02-15 NOTE — Telephone Encounter (Signed)
charge 

## 2017-02-15 NOTE — Telephone Encounter (Signed)
Patient cancelled her 4pm appointment today, patient declined to [facility] at this time, patient has a follow up appointment 02/18/17 with PCP, charge or no charge

## 2017-02-18 ENCOUNTER — Ambulatory Visit: Payer: Self-pay | Admitting: Family Medicine

## 2017-03-25 ENCOUNTER — Telehealth: Payer: Self-pay | Admitting: Family Medicine

## 2017-03-25 DIAGNOSIS — E1165 Type 2 diabetes mellitus with hyperglycemia: Principal | ICD-10-CM

## 2017-03-25 DIAGNOSIS — E1151 Type 2 diabetes mellitus with diabetic peripheral angiopathy without gangrene: Secondary | ICD-10-CM

## 2017-03-25 DIAGNOSIS — IMO0002 Reserved for concepts with insufficient information to code with codable children: Secondary | ICD-10-CM

## 2017-03-25 NOTE — Telephone Encounter (Signed)
Pt states needs orders for fasting labs for her diabetes medication. Pt has appt 04/02/17 with Carollee Herter. Please put in orders for labs this week so pt can keep getting her medication. Call in to [facility] diabetes medication Pt has two days left of diabetes medication.

## 2017-03-26 ENCOUNTER — Other Ambulatory Visit: Payer: Self-pay | Admitting: Family Medicine

## 2017-03-26 ENCOUNTER — Other Ambulatory Visit: Payer: Self-pay | Admitting: Medical

## 2017-03-26 DIAGNOSIS — E1165 Type 2 diabetes mellitus with hyperglycemia: Principal | ICD-10-CM

## 2017-03-26 DIAGNOSIS — IMO0002 Reserved for concepts with insufficient information to code with codable children: Secondary | ICD-10-CM

## 2017-03-26 DIAGNOSIS — E1151 Type 2 diabetes mellitus with diabetic peripheral angiopathy without gangrene: Secondary | ICD-10-CM

## 2017-03-26 DIAGNOSIS — I10 Essential (primary) hypertension: Secondary | ICD-10-CM

## 2017-03-26 DIAGNOSIS — K219 Gastro-esophageal reflux disease without esophagitis: Secondary | ICD-10-CM

## 2017-03-26 MED ORDER — SITAGLIP PHOS-METFORMIN HCL ER 100-1000 MG PO TB24: 1.0000 | ORAL_TABLET | Freq: Every day | ORAL | 0 refills | Status: DC

## 2017-03-26 NOTE — Telephone Encounter (Signed)
30d supply has already been faxed/since no-showed twice did not approve 90d/thx dmf

## 2017-03-26 NOTE — Telephone Encounter (Signed)
YL-Pt has appt for "female problem" on 10.09.18 for 30 minutes/she has requested lab order be put in to have them drawn prior to visit for her DM labs/plz advise/thx dmf

## 2017-03-28 ENCOUNTER — Other Ambulatory Visit: Payer: Self-pay | Admitting: Emergency Medicine

## 2017-03-28 ENCOUNTER — Other Ambulatory Visit: Payer: Self-pay | Admitting: Family Medicine

## 2017-03-28 ENCOUNTER — Ambulatory Visit: Payer: Self-pay | Admitting: Family Medicine

## 2017-03-28 DIAGNOSIS — N760 Acute vaginitis: Secondary | ICD-10-CM

## 2017-03-28 DIAGNOSIS — E1151 Type 2 diabetes mellitus with diabetic peripheral angiopathy without gangrene: Secondary | ICD-10-CM

## 2017-03-28 DIAGNOSIS — E1165 Type 2 diabetes mellitus with hyperglycemia: Principal | ICD-10-CM

## 2017-03-28 DIAGNOSIS — E785 Hyperlipidemia, unspecified: Secondary | ICD-10-CM

## 2017-03-28 DIAGNOSIS — D2372 Other benign neoplasm of skin of left lower limb, including hip: Secondary | ICD-10-CM | POA: Diagnosis not present

## 2017-03-28 DIAGNOSIS — IMO0002 Reserved for concepts with insufficient information to code with codable children: Secondary | ICD-10-CM

## 2017-03-28 DIAGNOSIS — I10 Essential (primary) hypertension: Secondary | ICD-10-CM

## 2017-03-28 NOTE — Progress Notes (Deleted)
Patient ID: Felicia Solis, female    DOB: 06-Jul-1965  Age: 51 y.o. MRN: [id number]    Subjective:  Subjective  HPI LIYLA RADLIFF presents for ***  Review of Systems  History Past Medical History:  Diagnosis Date  . Anemia    anemia  when pregnant in 2002  . Anxiety   . Arthritis    "knees and back" (09/28/2016)  . Asthma 5 yrs ago    told she had asthma.. does not remember doctors name.   . Chronic lower back pain   . Depression    takes citalopram  . History of hiatal hernia   . History of kidney stones   . Hyperlipidemia   . Hypothyroidism   . LBBB (left bundle branch block)    rate-related; identified on stress test 03/21/16  . Migraine    "weekly" (09/28/2016)  . 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 Shortness of breath    with exertion  . Thyroid disease   . Type II diabetes mellitus ([place]) dx'd 2016    She has a past surgical history that includes Endometrial ablation; Tubal ligation; laparoscopy; Dilation and curettage of uterus (1999); Thyroidectomy (09/28/2011); Thyroidectomy (09/28/2016); and Thyroidectomy (Left, 09/28/2016).   Her family history includes Breast cancer in her maternal aunt; Colon cancer in her paternal grandfather; Colon polyps in her father; Diabetes in her brother, mother, and sister; Gallbladder disease in her maternal grandmother and mother; Heart attack in her brother; Heart disease in her mother and other; Hyperlipidemia in her father and mother; Hypertension in her father and mother; Ovarian cancer in her maternal grandmother.She reports that she has never smoked. She has never used smokeless tobacco. She reports that she does not drink alcohol or use drugs.  Current Outpatient Prescriptions on File Prior to Visit  Medication Sig Dispense Refill  . acetaminophen (TYLENOL) 500 MG tablet Take 1,000 mg by mouth every 6 (six) hours as needed for mild pain.    Marland Kitchen aspirin EC 81 MG tablet Take 1 tablet  (81 mg total) by mouth daily. 90 tablet 3  . Biotin 5000 MCG CAPS Take 5,000 mcg by mouth daily.     . Cholecalciferol (VITAMIN D3) 2000 units TABS Take 2,000 Units by mouth daily.     . citalopram (CELEXA) 20 MG tablet Take 1 tablet (20 mg total) by mouth daily. 30 tablet 11  . cyanocobalamin 500 MCG tablet Take 500 mcg by mouth daily.    . cyclobenzaprine (FLEXERIL) 10 MG tablet Take 1 tablet (10 mg total) by mouth at bedtime. (Patient taking differently: Take 10 mg by mouth daily as needed for muscle spasms. ) 10 tablet 0  . Flaxseed, Linseed, (FLAXSEED OIL PO) Take 1,300 mg by mouth daily.    . fluticasone (FLONASE) 50 MCG/ACT nasal spray Place 2 sprays into both nostrils daily. (Patient taking differently: Place 2 sprays into both nostrils daily as needed for allergies. ) 16 g 1  . Ginkgo Biloba Extract 60 MG CAPS Take 60 mg by mouth daily.    Marland Kitchen glucose blood (ONETOUCH VERIO) test strip Onetouch Verio Check blood sugar twice daily 100 each 12  . HYDROcodone-acetaminophen (NORCO) 7.5-325 MG tablet Take 1 tablet by mouth every 6 (six) hours as needed for moderate pain. 20 tablet 0  . levothyroxine (SYNTHROID) 100 MCG tablet Take 1 tablet (100 mcg total) by mouth daily. 30 tablet 6  . losartan (  COZAAR) 25 MG tablet Take 1 tablet (25 mg total) by mouth daily. 30 tablet 5  . losartan (COZAAR) 25 MG tablet TAKE ONE TABLET BY MOUTH ONCE DAILY 30 tablet 5  . methocarbamol (ROBAXIN) 500 MG tablet Take 1 tablet (500 mg total) by mouth 2 (two) times daily. 20 tablet 0  . naproxen (NAPROSYN) 500 MG tablet Take 1 tablet (500 mg total) by mouth 2 (two) times daily with a meal. 30 tablet 0  . Omega-3 Fatty Acids (FISH OIL) 1200 MG CAPS Take 1,200 mg by mouth daily.    Glory Rosebush DELICA LANCETS FINE MISC Check blood sugar twice daily 100 each 12  . pantoprazole (PROTONIX) 40 MG tablet TAKE ONE TABLET BY MOUTH ONCE DAILY 30 tablet 3  . promethazine (PHENERGAN) 25 MG suppository Place 1 suppository (25 mg  total) rectally every 6 (six) hours as needed for nausea or vomiting. 12 suppository 1  . promethazine-dextromethorphan (PROMETHAZINE-DM) 6.25-15 MG/5ML syrup Take 5 mLs by mouth 4 (four) times daily as needed for cough. 180 mL 0  . simvastatin (ZOCOR) 40 MG tablet Take 1 tablet (40 mg total) by mouth at bedtime. 30 tablet 2  . SitaGLIPtin-MetFORMIN HCl (JANUMET XR) [phone] MG TB24 Take 1 tablet by mouth daily. 30 tablet 0   No current facility-administered medications on file prior to visit.      Objective:  Objective  Physical Exam There were no vitals taken for this visit. Wt Readings from Last 3 Encounters:  01/13/17 228 lb (103.4 kg)  12/06/16 228 lb 3.2 oz (103.5 kg)  10/10/16 220 lb (99.8 kg)     Lab Results  Component Value Date   WBC 11.3 (H) 09/30/2016   HGB 11.4 (L) 09/30/2016   HCT 34.7 (L) 09/30/2016   PLT 266 09/30/2016   GLUCOSE 122 (H) 09/30/2016   CHOL 168 08/21/2016   TRIG 140.0 08/21/2016   HDL 41.00 08/21/2016   LDLCALC 99 08/21/2016   ALT 26 08/21/2016   AST 20 08/21/2016   NA 140 09/30/2016   K 3.4 (L) 09/30/2016   CL 106 09/30/2016   CREATININE 0.54 09/30/2016   BUN 9 09/30/2016   CO2 26 09/30/2016   TSH 1.06 08/15/2015   HGBA1C 7.4 (H) 08/21/2016   MICROALBUR 1.2 08/15/2015    Dg Shoulder Left  Result Date: 01/13/2017 CLINICAL DATA:  Shoulder pain after a fall. EXAM: LEFT SHOULDER - 2+ VIEW COMPARISON:  None. FINDINGS: There is no evidence of fracture or dislocation. There is no evidence of arthropathy or other focal bone abnormality. Soft tissues are unremarkable. IMPRESSION: Negative. Electronically Signed   By: Misty Stanley M.D.   On: 01/13/2017 18:26   Dg Knee Complete 4 Views Right  Result Date: 01/13/2017 CLINICAL DATA:  Patient fell on Friday.  Now with knee pain. EXAM: RIGHT KNEE - COMPLETE 4+ VIEW COMPARISON:  None. FINDINGS: No evidence of fracture, dislocation, or joint effusion. No evidence of arthropathy or other focal bone  abnormality. Soft tissues are unremarkable. IMPRESSION: Negative. Electronically Signed   By: Misty Stanley M.D.   On: 01/13/2017 17:50     Assessment & Plan:  Plan  I am having Ms. Nagengast maintain her acetaminophen, glucose blood, ONETOUCH DELICA LANCETS FINE, cyanocobalamin, Ginkgo Biloba Extract, Biotin, (Flaxseed, Linseed, (FLAXSEED OIL PO)), Fish Oil, Vitamin D3, aspirin EC, fluticasone, citalopram, cyclobenzaprine, losartan, simvastatin, HYDROcodone-acetaminophen, promethazine, levothyroxine, promethazine-dextromethorphan, naproxen, methocarbamol, pantoprazole, losartan, and SitaGLIPtin-MetFORMIN HCl.  No orders of the defined types were placed in this encounter.  Problem List Items Addressed This Visit    None    Visit Diagnoses    DM (diabetes mellitus) type II uncontrolled, periph vascular disorder ([employer])    -  Primary   Relevant Orders   Hemoglobin A1c   Comprehensive metabolic panel   Microalbumin / creatinine urine ratio   POCT Urinalysis Dipstick (Automated)   Essential hypertension       Relevant Orders   Comprehensive metabolic panel   Microalbumin / creatinine urine ratio   POCT Urinalysis Dipstick (Automated)   Hyperlipidemia LDL goal <70       Relevant Orders   Lipid panel   Comprehensive metabolic panel      Follow-up: No Follow-up on file.  [name], DO

## 2017-03-28 NOTE — Telephone Encounter (Signed)
Order is in.

## 2017-03-29 NOTE — Telephone Encounter (Signed)
I'm out of Office 

## 2017-04-02 ENCOUNTER — Ambulatory Visit: Payer: Self-pay | Admitting: Family Medicine

## 2017-04-02 DIAGNOSIS — Z0289 Encounter for other administrative examinations: Secondary | ICD-10-CM

## 2017-04-04 ENCOUNTER — Encounter: Payer: Self-pay | Admitting: Family Medicine

## 2017-04-04 ENCOUNTER — Ambulatory Visit (INDEPENDENT_AMBULATORY_CARE_PROVIDER_SITE_OTHER): Payer: BLUE CROSS/BLUE SHIELD | Admitting: Family Medicine

## 2017-04-04 ENCOUNTER — Ambulatory Visit (HOSPITAL_BASED_OUTPATIENT_CLINIC_OR_DEPARTMENT_OTHER)
Admission: RE | Admit: 2017-04-04 | Discharge: 2017-04-04 | Disposition: A | Discharge status: Home / Self Care | Payer: BLUE CROSS/BLUE SHIELD | Source: Ambulatory Visit | Attending: Family Medicine | Admitting: Family Medicine

## 2017-04-04 VITALS — BP 120/86 | HR 73 | Temp 97.8°F | Resp 16 | Ht 66.0 in | Wt 231.0 lb

## 2017-04-04 DIAGNOSIS — N760 Acute vaginitis: Secondary | ICD-10-CM

## 2017-04-04 DIAGNOSIS — E785 Hyperlipidemia, unspecified: Secondary | ICD-10-CM | POA: Diagnosis not present

## 2017-04-04 DIAGNOSIS — N644 Mastodynia: Secondary | ICD-10-CM | POA: Diagnosis not present

## 2017-04-04 DIAGNOSIS — Z23 Encounter for immunization: Secondary | ICD-10-CM | POA: Diagnosis not present

## 2017-04-04 DIAGNOSIS — E1165 Type 2 diabetes mellitus with hyperglycemia: Secondary | ICD-10-CM

## 2017-04-04 DIAGNOSIS — E1151 Type 2 diabetes mellitus with diabetic peripheral angiopathy without gangrene: Secondary | ICD-10-CM | POA: Diagnosis not present

## 2017-04-04 DIAGNOSIS — M25552 Pain in left hip: Secondary | ICD-10-CM | POA: Insufficient documentation

## 2017-04-04 DIAGNOSIS — IMO0002 Reserved for concepts with insufficient information to code with codable children: Secondary | ICD-10-CM

## 2017-04-04 DIAGNOSIS — M5136 Other intervertebral disc degeneration, lumbar region: Secondary | ICD-10-CM

## 2017-04-04 DIAGNOSIS — M1612 Unilateral primary osteoarthritis, left hip: Secondary | ICD-10-CM | POA: Diagnosis not present

## 2017-04-04 DIAGNOSIS — I10 Essential (primary) hypertension: Secondary | ICD-10-CM

## 2017-04-04 DIAGNOSIS — E1169 Type 2 diabetes mellitus with other specified complication: Secondary | ICD-10-CM

## 2017-04-04 LAB — POC URINALSYSI DIPSTICK (AUTOMATED)
Bilirubin, UA: NEGATIVE
Blood, UA: NEGATIVE
Glucose, UA: NEGATIVE
KETONES UA: NEGATIVE
LEUKOCYTES UA: NEGATIVE
NITRITE UA: NEGATIVE
PROTEIN UA: NEGATIVE
Spec Grav, UA: >=1.03 — AB (ref 1.010–1.025)
UROBILINOGEN UA: 0.2 U/dL
pH, UA: 6 (ref 5.0–8.0)

## 2017-04-04 NOTE — Progress Notes (Signed)
Patient ID: Felicia Solis, female    DOB: December 29, 1965  Age: 51 y.o. MRN: [id number]    Subjective:  Subjective  HPI Felicia Solis presents for L groin and vaginal pain that started about 1 month ago.  No vag d/c. No dysuria.  Back is still bothering her as well and she is going to therapy.   She fell in July and was seen in ER--- xrays were done Pt was set up with PT Pain in vagina with walking , standing ,, stepping up on something.  It comes and goes.    Pt also c/o pain in R breast--  Rt low outer quadrant  Review of Systems  Constitutional: Negative for fever.  HENT: Negative for congestion.   Respiratory: Negative for shortness of breath.   Cardiovascular: Negative for chest pain, palpitations and leg swelling.  Gastrointestinal: Negative for abdominal pain, blood in stool and nausea.  Genitourinary: Positive for pelvic pain and vaginal pain. Negative for decreased urine volume, difficulty urinating, dysuria, frequency, urgency, vaginal bleeding and vaginal discharge.  Skin: Negative for rash.  Allergic/Immunologic: Negative for environmental allergies.  Neurological: Negative for dizziness and headaches.  Psychiatric/Behavioral: The patient is not nervous/anxious.     History Past Medical History:  Diagnosis Date  . Anemia    anemia  when pregnant in 2002  . Anxiety   . Arthritis    "knees and back" (09/28/2016)  . Asthma 5 yrs ago    told she had asthma.. does not remember doctors name.   . Chronic lower back pain   . Depression    takes citalopram  . History of hiatal hernia   . History of kidney stones   . Hyperlipidemia   . Hypothyroidism   . LBBB (left bundle branch block)    rate-related; identified on stress test 03/21/16  . Migraine    "weekly" (09/28/2016)  . 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 Shortness of breath    with exertion  . Thyroid disease   . Type II diabetes mellitus ([place]) dx'd 2016     She has a past surgical history that includes Endometrial ablation; Tubal ligation; laparoscopy; Dilation and curettage of uterus (1999); Thyroidectomy (09/28/2011); Thyroidectomy (09/28/2016); and Thyroidectomy (Left, 09/28/2016).   Her family history includes Breast cancer in her maternal aunt; Colon cancer in her paternal grandfather; Colon polyps in her father; Diabetes in her brother, mother, and sister; Gallbladder disease in her maternal grandmother and mother; Heart attack in her brother; Heart disease in her mother and other; Hyperlipidemia in her father and mother; Hypertension in her father and mother; Ovarian cancer in her maternal grandmother.She reports that she has never smoked. She has never used smokeless tobacco. She reports that she does not drink alcohol or use drugs.  Current Outpatient Prescriptions on File Prior to Visit  Medication Sig Dispense Refill  . acetaminophen (TYLENOL) 500 MG tablet Take 1,000 mg by mouth every 6 (six) hours as needed for mild pain.    Marland Kitchen aspirin EC 81 MG tablet Take 1 tablet (81 mg total) by mouth daily. 90 tablet 3  . Biotin 5000 MCG CAPS Take 5,000 mcg by mouth daily.     . Cholecalciferol (VITAMIN D3) 2000 units TABS Take 2,000 Units by mouth daily.     . citalopram (CELEXA) 20 MG tablet Take 1 tablet (20 mg total) by mouth daily. 30 tablet 11  . cyanocobalamin  500 MCG tablet Take 500 mcg by mouth daily.    . cyclobenzaprine (FLEXERIL) 10 MG tablet Take 1 tablet (10 mg total) by mouth at bedtime. (Patient taking differently: Take 10 mg by mouth daily as needed for muscle spasms. ) 10 tablet 0  . Flaxseed, Linseed, (FLAXSEED OIL PO) Take 1,300 mg by mouth daily.    . fluticasone (FLONASE) 50 MCG/ACT nasal spray Place 2 sprays into both nostrils daily. (Patient taking differently: Place 2 sprays into both nostrils daily as needed for allergies. ) 16 g 1  . Ginkgo Biloba Extract 60 MG CAPS Take 60 mg by mouth daily.    Marland Kitchen glucose blood (ONETOUCH  VERIO) test strip Onetouch Verio Check blood sugar twice daily 100 each 12  . HYDROcodone-acetaminophen (NORCO) 7.5-325 MG tablet Take 1 tablet by mouth every 6 (six) hours as needed for moderate pain. 20 tablet 0  . levothyroxine (SYNTHROID) 100 MCG tablet Take 1 tablet (100 mcg total) by mouth daily. 30 tablet 6  . losartan (COZAAR) 25 MG tablet Take 1 tablet (25 mg total) by mouth daily. 30 tablet 5  . methocarbamol (ROBAXIN) 500 MG tablet Take 1 tablet (500 mg total) by mouth 2 (two) times daily. 20 tablet 0  . Omega-3 Fatty Acids (FISH OIL) 1200 MG CAPS Take 1,200 mg by mouth daily.    Glory Rosebush DELICA LANCETS FINE MISC Check blood sugar twice daily 100 each 12  . pantoprazole (PROTONIX) 40 MG tablet TAKE ONE TABLET BY MOUTH ONCE DAILY 30 tablet 3  . simvastatin (ZOCOR) 40 MG tablet Take 1 tablet (40 mg total) by mouth at bedtime. 30 tablet 2  . SitaGLIPtin-MetFORMIN HCl (JANUMET XR) [id number] MG TB24 Take 1 tablet by mouth daily. 30 tablet 0   No current facility-administered medications on file prior to visit.      Objective:  Objective  Physical Exam  Constitutional: She is oriented to person, place, and time. She appears well-developed and well-nourished.  HENT:  Head: Normocephalic and atraumatic.  Eyes: Conjunctivae and EOM are normal.  Neck: Normal range of motion. Neck supple. No JVD present. Carotid bruit is not present. No thyromegaly present.  Cardiovascular: Normal rate, regular rhythm and normal heart sounds.   No murmur heard. Pulmonary/Chest: Effort normal and breath sounds normal. No respiratory distress. She has no wheezes. She has no rales. She exhibits no tenderness.  Musculoskeletal: She exhibits no edema.       Lumbar back: She exhibits pain and spasm.  Neurological: She is alert and oriented to person, place, and time. Gait normal.  dtr = and b/l  Weakness in L hip and Low leg   Psychiatric: She has a normal mood and affect.   BP 120/86 (BP Location: Right  Arm, Patient Position: Sitting, Cuff Size: Normal)   Pulse 73   Temp 97.8 F (36.6 C) (Oral)   Resp 16   Ht 5\' 6"  (1.676 m)   Wt 231 lb (104.8 kg)   SpO2 96%   BMI 37.28 kg/m  Wt Readings from Last 3 Encounters:  04/04/17 231 lb (104.8 kg)  01/13/17 228 lb (103.4 kg)  12/06/16 228 lb 3.2 oz (103.5 kg)     Lab Results  Component Value Date   WBC 11.3 (H) 09/30/2016   HGB 11.4 (L) 09/30/2016   HCT 34.7 (L) 09/30/2016   PLT 266 09/30/2016   GLUCOSE 104 (H) 04/04/2017   CHOL 160 04/04/2017   TRIG 103.0 04/04/2017   HDL 38.90 (L) 04/04/2017  LDLCALC 100 (H) 04/04/2017   ALT 24 04/04/2017   AST 21 04/04/2017   NA 142 04/04/2017   K 4.1 04/04/2017   CL 106 04/04/2017   CREATININE 0.51 04/04/2017   BUN 13 04/04/2017   CO2 28 04/04/2017   TSH 1.06 08/15/2015   HGBA1C 6.9 (H) 04/04/2017   MICROALBUR 2.5 (H) 04/04/2017    Dg Shoulder Left  Result Date: 01/13/2017 CLINICAL DATA:  Shoulder pain after a fall. EXAM: LEFT SHOULDER - 2+ VIEW COMPARISON:  None. FINDINGS: There is no evidence of fracture or dislocation. There is no evidence of arthropathy or other focal bone abnormality. Soft tissues are unremarkable. IMPRESSION: Negative. Electronically Signed   By: Misty Stanley M.D.   On: 01/13/2017 18:26   Dg Knee Complete 4 Views Right  Result Date: 01/13/2017 CLINICAL DATA:  Patient fell on Friday.  Now with knee pain. EXAM: RIGHT KNEE - COMPLETE 4+ VIEW COMPARISON:  None. FINDINGS: No evidence of fracture, dislocation, or joint effusion. No evidence of arthropathy or other focal bone abnormality. Soft tissues are unremarkable. IMPRESSION: Negative. Electronically Signed   By: Misty Stanley M.D.   On: 01/13/2017 17:50     Assessment & Plan:  Plan  I have discontinued Ms. Peruski's promethazine, promethazine-dextromethorphan, and naproxen. I am also having her maintain her acetaminophen, glucose blood, ONETOUCH DELICA LANCETS FINE, cyanocobalamin, Ginkgo Biloba Extract, Biotin,  (Flaxseed, Linseed, (FLAXSEED OIL PO)), Fish Oil, Vitamin D3, aspirin EC, fluticasone, citalopram, cyclobenzaprine, losartan, simvastatin, HYDROcodone-acetaminophen, levothyroxine, methocarbamol, pantoprazole, SitaGLIPtin-MetFORMIN HCl, and ibuprofen.  Meds ordered this encounter  Medications  . ibuprofen (ADVIL,MOTRIN) 800 MG tablet    Sig: TK 1 T PO  Q 6-8 H PRF PAIN    Refill:  0    Problem List Items Addressed This Visit      Unprioritized   Acute vaginitis    ua ancillary       Breast pain   Relevant Orders   MM Digital Diagnostic Bilat   US BREAST COMPLETE UNI RIGHT INC AXILLA   Diabetes mellitus ([place]) - Primary    hgba1c to be checked, minimize simple carbs. Increase exercise as tolerated. Continue current meds       Essential hypertension    Well controlled, no changes to meds. Encouraged heart healthy diet such as the DASH diet and exercise as tolerated.       Hyperlipidemia LDL goal <70    Encouraged heart healthy diet, increase exercise, avoid trans fats, consider a krill oil cap daily      Left hip pain   Relevant Orders   DG HIP UNILAT WITH PELVIS 2-3 VIEWS LEFT (Completed)   Other intervertebral disc degeneration, lumbar region   Relevant Medications   ibuprofen (ADVIL,MOTRIN) 800 MG tablet   Other Relevant Orders   MR Lumbar Spine Wo Contrast    Other Visit Diagnoses    Need for shingles vaccine       Relevant Orders   Varicella-zoster vaccine IM (Shingrix) (Completed)   DM (diabetes mellitus) type II uncontrolled, periph vascular disorder ([place])          Follow-up: Return in about 6 months (around 10/03/2017), or if symptoms worsen or fail to improve.  [name], DO

## 2017-04-04 NOTE — Patient Instructions (Signed)

## 2017-04-05 LAB — COMPREHENSIVE METABOLIC PANEL
ALT: 24 U/L (ref 0–35)
AST: 21 U/L (ref 0–37)
Albumin: 4.5 g/dL (ref 3.5–5.2)
Alkaline Phosphatase: 43 U/L (ref 39–117)
BILIRUBIN TOTAL: 0.4 mg/dL (ref 0.2–1.2)
BUN: 13 mg/dL (ref 6–23)
CALCIUM: 8.6 mg/dL (ref 8.4–10.5)
CHLORIDE: 106 meq/L (ref 96–112)
CO2: 28 meq/L (ref 19–32)
Creatinine, Ser: 0.51 mg/dL (ref 0.40–1.20)
GFR: 162.56 mL/min (ref >60.00)
Glucose, Bld: 104 mg/dL — ABNORMAL HIGH (ref 70–99)
Potassium: 4.1 mEq/L (ref 3.5–5.1)
Sodium: 142 mEq/L (ref 135–145)
Total Protein: 7.3 g/dL (ref 6.0–8.3)

## 2017-04-05 LAB — LIPID PANEL
CHOL/HDL RATIO: 4
Cholesterol: 160 mg/dL (ref 0–200)
HDL: 38.9 mg/dL — AB (ref >39.00)
LDL CALC: 100 mg/dL — AB (ref 0–99)
NonHDL: 120.89
TRIGLYCERIDES: 103 mg/dL (ref 0.0–149.0)
VLDL: 20.6 mg/dL (ref 0.0–40.0)

## 2017-04-05 LAB — MICROALBUMIN / CREATININE URINE RATIO
CREATININE, U: 133.9 mg/dL
Microalb Creat Ratio: 1.9 mg/g (ref 0.0–30.0)
Microalb, Ur: 2.5 mg/dL — ABNORMAL HIGH (ref 0.0–1.9)

## 2017-04-05 LAB — HEMOGLOBIN A1C: Hgb A1c MFr Bld: 6.9 % — ABNORMAL HIGH (ref 4.6–6.5)

## 2017-04-07 DIAGNOSIS — M25552 Pain in left hip: Secondary | ICD-10-CM | POA: Insufficient documentation

## 2017-04-07 DIAGNOSIS — E785 Hyperlipidemia, unspecified: Secondary | ICD-10-CM | POA: Insufficient documentation

## 2017-04-07 DIAGNOSIS — N760 Acute vaginitis: Secondary | ICD-10-CM | POA: Insufficient documentation

## 2017-04-07 DIAGNOSIS — I10 Essential (primary) hypertension: Secondary | ICD-10-CM | POA: Insufficient documentation

## 2017-04-07 DIAGNOSIS — M5136 Other intervertebral disc degeneration, lumbar region: Secondary | ICD-10-CM | POA: Insufficient documentation

## 2017-04-07 NOTE — Assessment & Plan Note (Signed)
Well controlled, no changes to meds. Encouraged heart healthy diet such as the DASH diet and exercise as tolerated.  °

## 2017-04-07 NOTE — Assessment & Plan Note (Signed)
Encouraged heart healthy diet, increase exercise, avoid trans fats, consider a krill oil cap daily 

## 2017-04-07 NOTE — Assessment & Plan Note (Signed)
hgba1c to be checked, minimize simple carbs. Increase exercise as tolerated. Continue current meds  

## 2017-04-07 NOTE — Assessment & Plan Note (Signed)
ua ancillary

## 2017-04-08 LAB — URINE CYTOLOGY ANCILLARY ONLY
Chlamydia: NEGATIVE
Neisseria Gonorrhea: NEGATIVE
Trichomonas: NEGATIVE

## 2017-04-10 LAB — URINE CYTOLOGY ANCILLARY ONLY
BACTERIAL VAGINITIS: NEGATIVE
CANDIDA VAGINITIS: POSITIVE — AB

## 2017-04-11 ENCOUNTER — Other Ambulatory Visit: Payer: Self-pay | Admitting: Family Medicine

## 2017-04-11 DIAGNOSIS — E785 Hyperlipidemia, unspecified: Secondary | ICD-10-CM

## 2017-04-11 MED ORDER — FLUCONAZOLE 150 MG PO TABS: ORAL_TABLET | ORAL | 0 refills | Status: DC

## 2017-04-19 ENCOUNTER — Other Ambulatory Visit: Payer: Self-pay

## 2017-04-19 ENCOUNTER — Inpatient Hospital Stay: Admission: RE | Admit: 2017-04-19 | Payer: Self-pay | Source: Ambulatory Visit

## 2017-04-30 ENCOUNTER — Ambulatory Visit: Admission: RE | Admit: 2017-04-30 | Payer: Self-pay | Source: Ambulatory Visit

## 2017-04-30 ENCOUNTER — Other Ambulatory Visit: Payer: Self-pay | Admitting: Family Medicine

## 2017-04-30 ENCOUNTER — Ambulatory Visit
Admission: RE | Admit: 2017-04-30 | Discharge: 2017-04-30 | Disposition: A | Discharge status: Home / Self Care | Payer: BLUE CROSS/BLUE SHIELD | Source: Ambulatory Visit | Attending: Family Medicine | Admitting: Family Medicine

## 2017-04-30 DIAGNOSIS — N644 Mastodynia: Secondary | ICD-10-CM

## 2017-05-13 ENCOUNTER — Other Ambulatory Visit: Payer: Self-pay | Admitting: Family Medicine

## 2017-05-13 DIAGNOSIS — E1151 Type 2 diabetes mellitus with diabetic peripheral angiopathy without gangrene: Secondary | ICD-10-CM

## 2017-05-13 DIAGNOSIS — IMO0002 Reserved for concepts with insufficient information to code with codable children: Secondary | ICD-10-CM

## 2017-05-13 DIAGNOSIS — E1165 Type 2 diabetes mellitus with hyperglycemia: Principal | ICD-10-CM

## 2017-05-13 NOTE — Telephone Encounter (Signed)
Patient following up on med refill for SitaGLIPtin-MetFORMIN HCl (JANUMET XR) [phone] MG TB24. Patient request tot have this at [facility] on [address]

## 2017-05-14 MED ORDER — SITAGLIP PHOS-METFORMIN HCL ER 100-1000 MG PO TB24: ORAL_TABLET | ORAL | 0 refills | Status: DC

## 2017-05-14 NOTE — Telephone Encounter (Signed)
rx sent

## 2017-05-15 ENCOUNTER — Ambulatory Visit: Payer: BLUE CROSS/BLUE SHIELD

## 2017-05-15 ENCOUNTER — Ambulatory Visit
Admission: RE | Admit: 2017-05-15 | Discharge: 2017-05-15 | Disposition: A | Discharge status: Home / Self Care | Payer: BLUE CROSS/BLUE SHIELD | Source: Ambulatory Visit | Attending: Family Medicine | Admitting: Family Medicine

## 2017-05-15 DIAGNOSIS — N644 Mastodynia: Secondary | ICD-10-CM

## 2017-05-15 DIAGNOSIS — R928 Other abnormal and inconclusive findings on diagnostic imaging of breast: Secondary | ICD-10-CM | POA: Diagnosis not present

## 2017-05-27 ENCOUNTER — Ambulatory Visit: Payer: BLUE CROSS/BLUE SHIELD | Admitting: Family Medicine

## 2017-05-27 ENCOUNTER — Encounter: Payer: Self-pay | Admitting: Family Medicine

## 2017-05-27 ENCOUNTER — Ambulatory Visit (INDEPENDENT_AMBULATORY_CARE_PROVIDER_SITE_OTHER): Payer: BLUE CROSS/BLUE SHIELD | Admitting: Family Medicine

## 2017-05-27 VITALS — BP 114/66 | HR 77 | Temp 98.1°F | Resp 16 | Ht 66.0 in | Wt 239.4 lb

## 2017-05-27 DIAGNOSIS — F411 Generalized anxiety disorder: Secondary | ICD-10-CM | POA: Diagnosis not present

## 2017-05-27 DIAGNOSIS — E1151 Type 2 diabetes mellitus with diabetic peripheral angiopathy without gangrene: Secondary | ICD-10-CM

## 2017-05-27 DIAGNOSIS — IMO0002 Reserved for concepts with insufficient information to code with codable children: Secondary | ICD-10-CM

## 2017-05-27 DIAGNOSIS — E1165 Type 2 diabetes mellitus with hyperglycemia: Secondary | ICD-10-CM | POA: Diagnosis not present

## 2017-05-27 DIAGNOSIS — E559 Vitamin D deficiency, unspecified: Secondary | ICD-10-CM

## 2017-05-27 DIAGNOSIS — I10 Essential (primary) hypertension: Secondary | ICD-10-CM

## 2017-05-27 DIAGNOSIS — E538 Deficiency of other specified B group vitamins: Secondary | ICD-10-CM | POA: Diagnosis not present

## 2017-05-27 DIAGNOSIS — K219 Gastro-esophageal reflux disease without esophagitis: Secondary | ICD-10-CM

## 2017-05-27 DIAGNOSIS — E785 Hyperlipidemia, unspecified: Secondary | ICD-10-CM | POA: Diagnosis not present

## 2017-05-27 MED ORDER — SIMVASTATIN 40 MG PO TABS: 40.0000 mg | ORAL_TABLET | Freq: Every day | ORAL | 1 refills | Status: DC

## 2017-05-27 MED ORDER — GLUCOSE BLOOD VI STRP: ORAL_STRIP | 12 refills | Status: DC

## 2017-05-27 MED ORDER — CITALOPRAM HYDROBROMIDE 20 MG PO TABS: 20.0000 mg | ORAL_TABLET | Freq: Every day | ORAL | 3 refills | Status: DC

## 2017-05-27 MED ORDER — PANTOPRAZOLE SODIUM 40 MG PO TBEC: 40.0000 mg | DELAYED_RELEASE_TABLET | Freq: Every day | ORAL | 3 refills | Status: DC

## 2017-05-27 MED ORDER — SITAGLIP PHOS-METFORMIN HCL ER 100-1000 MG PO TB24: ORAL_TABLET | ORAL | 1 refills | Status: DC

## 2017-05-27 MED ORDER — LOSARTAN POTASSIUM 25 MG PO TABS: 25.0000 mg | ORAL_TABLET | Freq: Every day | ORAL | 1 refills | Status: DC

## 2017-05-27 NOTE — Progress Notes (Signed)
Patient ID: Felicia Solis, female   DOB: [birth year], 51 y.o.   MRN: [id number]     Subjective:  I acted as a Education administrator for Dr. Carollee Herter.  [name], [place]   Patient ID: Felicia Solis, female    DOB: 1966-05-10, 51 y.o.   MRN: [id number]  Chief Complaint  Patient presents with  . Hypertension  . Hyperlipidemia  . Diabetes    HPI  Patient is in today for follow diabetes, blood pressure, and cholesterol.  HPI HYPERTENSION   Blood pressure range-not checking   Chest pain- no      Dyspnea- no Lightheadedness- no   Edema- no  Other side effects - no   Medication compliance: good  Low salt diet- yes     DIABETES    Blood Sugar ranges-not checking   Polyuria- no New Visual problems- no  Hypoglycemic symptoms- no  Other side effects-no Medication compliance - good Last eye exam- due Foot exam- today   HYPERLIPIDEMIA  Medication compliance- good RUQ pain- no  Muscle aches- no Other side effects-no       Patient Care Team: [name], DO as PCP - General (Family Medicine) Karl Luke, MD as Referring Physician (Optometry) Crawford Givens, MD as Consulting Physician (Obstetrics and Gynecology) Izora Gala, MD as Consulting Physician (Otolaryngology)   Past Medical History:  Diagnosis Date  . Anemia    anemia  when pregnant in 2002  . Anxiety   . Arthritis    "knees and back" (09/28/2016)  . Asthma 5 yrs ago    told she had asthma.. does not remember doctors name.   . Chronic lower back pain   . Depression    takes citalopram  . History of hiatal hernia   . History of kidney stones   . Hyperlipidemia   . Hypothyroidism   . LBBB (left bundle branch block)    rate-related; identified on stress test 03/21/16  . Migraine    "weekly" (09/28/2016)  . 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 Shortness of breath    with exertion  . Thyroid disease   . Type II diabetes mellitus ([place]) dx'd 2016    Past  Surgical History:  Procedure Laterality Date  . DILATION AND CURETTAGE OF UTERUS  1999  . ENDOMETRIAL ABLATION    . LAPAROSCOPY     years ago ..pt does not know where surgery was done ... states" my stomach would blow up then go down"  . THYROIDECTOMY  09/28/2011   Procedure: THYROIDECTOMY;  Surgeon: Izora Gala, MD;  Location: [place];  Service: ENT;  Laterality: Right;  RIGHT THYROIDECTOMY WITH FROZEN SECTION  . THYROIDECTOMY  09/28/2016   completion of thyroidectomy/notes 09/28/2016  . THYROIDECTOMY Left 09/28/2016   Procedure: COMPLETION OF THYROIDECTOMY;  Surgeon: Izora Gala, MD;  Location: [place];  Service: ENT;  Laterality: Left;  . TUBAL LIGATION      Family History  Problem Relation Age of Onset  . Diabetes Mother   . Hypertension Mother   . Hyperlipidemia Mother   . Gallbladder disease Mother   . Heart disease Mother   . Diabetes Sister   . Hypertension Father   . Hyperlipidemia Father   . Colon polyps Father   . Ovarian cancer Maternal Grandmother   . Gallbladder disease Maternal Grandmother   . Colon cancer Paternal Grandfather   . Breast cancer Maternal Aunt   .  Heart disease Other        early cad  . Diabetes Brother   . Heart attack Brother   . Anesthesia problems Neg Hx   . Hypotension Neg Hx   . Malignant hyperthermia Neg Hx   . Pseudochol deficiency Neg Hx   . Esophageal cancer Neg Hx     Social History   Socioeconomic History  . Marital status: Married    Spouse name: Not on file  . Number of children: 3  . Years of education: Not on file  . Highest education level: Not on file  Social Needs  . Financial resource strain: Not on file  . Food insecurity - worry: Not on file  . Food insecurity - inability: Not on file  . Transportation needs - medical: Not on file  . Transportation needs - non-medical: Not on file  Occupational History  . Occupation: CNA  Tobacco Use  . Smoking status: Never Smoker  . Smokeless tobacco: Never Used  Substance and  Sexual Activity  . Alcohol use: No    Alcohol/week: 0.0 oz  . Drug use: No  . Sexual activity: Yes    Partners: Male    Birth control/protection: Surgical  Other Topics Concern  . Not on file  Social History Narrative   No exercise--  Walking and lifting pts at work    Outpatient Medications Prior to Visit  Medication Sig Dispense Refill  . acetaminophen (TYLENOL) 500 MG tablet Take 1,000 mg by mouth every 6 (six) hours as needed for mild pain.    Marland Kitchen aspirin EC 81 MG tablet Take 1 tablet (81 mg total) by mouth daily. 90 tablet 3  . Biotin 5000 MCG CAPS Take 5,000 mcg by mouth daily.     . Cholecalciferol (VITAMIN D3) 2000 units TABS Take 2,000 Units by mouth daily.     . cyanocobalamin 500 MCG tablet Take 500 mcg by mouth daily.    . cyclobenzaprine (FLEXERIL) 10 MG tablet Take 1 tablet (10 mg total) by mouth at bedtime. (Patient taking differently: Take 10 mg by mouth daily as needed for muscle spasms. ) 10 tablet 0  . Flaxseed, Linseed, (FLAXSEED OIL PO) Take 1,300 mg by mouth daily.    . fluconazole (DIFLUCAN) 150 MG tablet Take 1 by mouth once repeat in 3 days if needed. 2 tablet 0  . fluticasone (FLONASE) 50 MCG/ACT nasal spray Place 2 sprays into both nostrils daily. (Patient taking differently: Place 2 sprays into both nostrils daily as needed for allergies. ) 16 g 1  . Ginkgo Biloba Extract 60 MG CAPS Take 60 mg by mouth daily.    Marland Kitchen HYDROcodone-acetaminophen (NORCO) 7.5-325 MG tablet Take 1 tablet by mouth every 6 (six) hours as needed for moderate pain. 20 tablet 0  . ibuprofen (ADVIL,MOTRIN) 800 MG tablet TK 1 T PO  Q 6-8 H PRF PAIN  0  . levothyroxine (SYNTHROID) 100 MCG tablet Take 1 tablet (100 mcg total) by mouth daily. 30 tablet 6  . methocarbamol (ROBAXIN) 500 MG tablet Take 1 tablet (500 mg total) by mouth 2 (two) times daily. 20 tablet 0  . Omega-3 Fatty Acids (FISH OIL) 1200 MG CAPS Take 1,200 mg by mouth daily.    Glory Rosebush DELICA LANCETS FINE MISC Check blood sugar  twice daily 100 each 12  . citalopram (CELEXA) 20 MG tablet Take 1 tablet (20 mg total) by mouth daily. 30 tablet 11  . glucose blood (ONETOUCH VERIO) test strip [employer]  Check blood sugar twice daily 100 each 12  . losartan (COZAAR) 25 MG tablet Take 1 tablet (25 mg total) by mouth daily. 30 tablet 5  . pantoprazole (PROTONIX) 40 MG tablet TAKE ONE TABLET BY MOUTH ONCE DAILY 30 tablet 3  . simvastatin (ZOCOR) 40 MG tablet Take 1 tablet (40 mg total) by mouth at bedtime. 30 tablet 2  . SitaGLIPtin-MetFORMIN HCl (JANUMET XR) [phone] MG TB24 TAKE 1 TABLET BY MOUTH ONCE DAILY **MUST  BE  AT  04-02-17  APPOINTMENT  FOR  FUTURE  FILLS** 30 tablet 0   No facility-administered medications prior to visit.     Allergies  Allergen Reactions  . Lisinopril Cough    Review of Systems  Constitutional: Negative for chills, fever and malaise/fatigue.  HENT: Negative for congestion and hearing loss.   Eyes: Negative for blurred vision and discharge.  Respiratory: Negative for cough, sputum production and shortness of breath.   Cardiovascular: Negative for chest pain, palpitations and leg swelling.  Gastrointestinal: Negative for abdominal pain, blood in stool, constipation, diarrhea, heartburn, nausea and vomiting.  Genitourinary: Negative for dysuria, frequency, hematuria and urgency.  Musculoskeletal: Negative for back pain, falls and myalgias.  Skin: Negative for rash.  Neurological: Negative for dizziness, sensory change, loss of consciousness, weakness and headaches.  Endo/Heme/Allergies: Negative for environmental allergies. Does not bruise/bleed easily.  Psychiatric/Behavioral: Negative for depression and suicidal ideas. The patient is not nervous/anxious and does not have insomnia.        Objective:    Physical Exam  Constitutional: She is oriented to person, place, and time. She appears well-developed and well-nourished.  HENT:  Head: Normocephalic and atraumatic.  Eyes:  Conjunctivae and EOM are normal.  Neck: Normal range of motion. Neck supple. No JVD present. Carotid bruit is not present. No thyromegaly present.  Cardiovascular: Normal rate, regular rhythm and normal heart sounds.  No murmur heard. Pulmonary/Chest: Effort normal and breath sounds normal. No respiratory distress. She has no wheezes. She has no rales. She exhibits no tenderness.  Musculoskeletal: She exhibits no edema.  Neurological: She is alert and oriented to person, place, and time.  Psychiatric: She has a normal mood and affect.  Nursing note and vitals reviewed. Sensory exam of the foot is normal, tested with the monofilament. Good pulses, no lesions or ulcers, good peripheral pulses.   BP 114/66 (BP Location: Right Arm, Cuff Size: Large)   Pulse 77   Temp 98.1 F (36.7 C) (Oral)   Resp 16   Ht 5\' 6"  (1.676 m)   Wt 239 lb 6.4 oz (108.6 kg)   SpO2 97%   BMI 38.64 kg/m  Wt Readings from Last 3 Encounters:  05/27/17 239 lb 6.4 oz (108.6 kg)  04/04/17 231 lb (104.8 kg)  01/13/17 228 lb (103.4 kg)   BP Readings from Last 3 Encounters:  05/27/17 114/66  04/04/17 120/86  01/13/17 128/75     Immunization History  Administered Date(s) Administered  . Zoster Recombinat (Shingrix) 04/04/2017    Health Maintenance  Topic Date Due  . PNEUMOCOCCAL POLYSACCHARIDE VACCINE (1) 10/12/1966  . TETANUS/TDAP  08/27/2015  . INFLUENZA VACCINE  08/21/2017 (Originally 01/23/2017)  . PAP SMEAR  08/06/2017  . FOOT EXAM  08/21/2017  . OPHTHALMOLOGY EXAM  09/18/2017  . HEMOGLOBIN A1C  10/03/2017  . MAMMOGRAM  05/15/2018  . COLONOSCOPY  12/02/2024  . HIV Screening  Completed    Lab Results  Component Value Date   WBC 11.3 (H) 09/30/2016   HGB  11.4 (L) 09/30/2016   HCT 34.7 (L) 09/30/2016   PLT 266 09/30/2016   GLUCOSE 104 (H) 04/04/2017   CHOL 160 04/04/2017   TRIG 103.0 04/04/2017   HDL 38.90 (L) 04/04/2017   LDLCALC 100 (H) 04/04/2017   ALT 24 04/04/2017   AST 21 04/04/2017    NA 142 04/04/2017   K 4.1 04/04/2017   CL 106 04/04/2017   CREATININE 0.51 04/04/2017   BUN 13 04/04/2017   CO2 28 04/04/2017   TSH 1.06 08/15/2015   HGBA1C 6.9 (H) 04/04/2017   MICROALBUR 2.5 (H) 04/04/2017    Lab Results  Component Value Date   TSH 1.06 08/15/2015   Lab Results  Component Value Date   WBC 11.3 (H) 09/30/2016   HGB 11.4 (L) 09/30/2016   HCT 34.7 (L) 09/30/2016   MCV 85.5 09/30/2016   PLT 266 09/30/2016   Lab Results  Component Value Date   NA 142 04/04/2017   K 4.1 04/04/2017   CO2 28 04/04/2017   GLUCOSE 104 (H) 04/04/2017   BUN 13 04/04/2017   CREATININE 0.51 04/04/2017   BILITOT 0.4 04/04/2017   ALKPHOS 43 04/04/2017   AST 21 04/04/2017   ALT 24 04/04/2017   PROT 7.3 04/04/2017   ALBUMIN 4.5 04/04/2017   CALCIUM 8.6 04/04/2017   ANIONGAP 8 09/30/2016   GFR 162.56 04/04/2017   Lab Results  Component Value Date   CHOL 160 04/04/2017   Lab Results  Component Value Date   HDL 38.90 (L) 04/04/2017   Lab Results  Component Value Date   LDLCALC 100 (H) 04/04/2017   Lab Results  Component Value Date   TRIG 103.0 04/04/2017   Lab Results  Component Value Date   CHOLHDL 4 04/04/2017   Lab Results  Component Value Date   HGBA1C 6.9 (H) 04/04/2017         Assessment & Plan:   Problem List Items Addressed This Visit      Unprioritized   DM (diabetes mellitus) type II uncontrolled, periph vascular disorder ([place])    hgba1c to be checked, minimize simple carbs. Increase exercise as tolerated. Continue current meds      Relevant Medications   losartan (COZAAR) 25 MG tablet   simvastatin (ZOCOR) 40 MG tablet   SitaGLIPtin-MetFORMIN HCl (JANUMET XR) [phone] MG TB24   glucose blood (ONETOUCH VERIO) test strip   Essential hypertension    Well controlled, no changes to meds. Encouraged heart healthy diet such as the DASH diet and exercise as tolerated.       Relevant Medications   losartan (COZAAR) 25 MG tablet   simvastatin  (ZOCOR) 40 MG tablet   Hyperlipidemia LDL goal <70 - Primary    Tolerating statin, encouraged heart healthy diet, avoid trans fats, minimize simple carbs and saturated fats. Increase exercise as tolerated      Relevant Medications   losartan (COZAAR) 25 MG tablet   simvastatin (ZOCOR) 40 MG tablet    Other Visit Diagnoses    Generalized anxiety disorder       Relevant Medications   citalopram (CELEXA) 20 MG tablet   Gastroesophageal reflux disease, esophagitis presence not specified       Relevant Medications   pantoprazole (PROTONIX) 40 MG tablet   Hyperlipidemia, unspecified hyperlipidemia type       Relevant Medications   losartan (COZAAR) 25 MG tablet   simvastatin (ZOCOR) 40 MG tablet   Vitamin D deficiency       Relevant Orders  Vitamin D (25 hydroxy)   Vitamin B12 deficiency       Relevant Orders   Vitamin B12      I have changed Adely B. Schwan's pantoprazole and SitaGLIPtin-MetFORMIN HCl. I am also having her maintain her acetaminophen, ONETOUCH DELICA LANCETS FINE, cyanocobalamin, Ginkgo Biloba Extract, Biotin, (Flaxseed, Linseed, (FLAXSEED OIL PO)), Fish Oil, Vitamin D3, aspirin EC, fluticasone, cyclobenzaprine, HYDROcodone-acetaminophen, levothyroxine, methocarbamol, ibuprofen, fluconazole, citalopram, losartan, simvastatin, and glucose blood.  Meds ordered this encounter  Medications  . citalopram (CELEXA) 20 MG tablet    Sig: Take 1 tablet (20 mg total) by mouth daily.    Dispense:  90 tablet    Refill:  3  . losartan (COZAAR) 25 MG tablet    Sig: Take 1 tablet (25 mg total) by mouth daily.    Dispense:  90 tablet    Refill:  1  . pantoprazole (PROTONIX) 40 MG tablet    Sig: Take 1 tablet (40 mg total) by mouth daily.    Dispense:  90 tablet    Refill:  3    Please consider 90 day supplies to promote better adherence  . simvastatin (ZOCOR) 40 MG tablet    Sig: Take 1 tablet (40 mg total) by mouth at bedtime.    Dispense:  90 tablet    Refill:  1  .  SitaGLIPtin-MetFORMIN HCl (JANUMET XR) [phone] MG TB24    Sig: TAKE 1 TABLET BY MOUTH ONCE DAILY    Dispense:  90 tablet    Refill:  1    Please consider 90 day supplies to promote better adherence  . glucose blood (ONETOUCH VERIO) test strip    Sig: Onetouch Verio Check blood sugar twice daily    Dispense:  100 each    Refill:  12    CMA served as scribe during this visit. History, Physical and Plan performed by medical provider. Documentation and orders reviewed and attested to.  [name], DO

## 2017-05-27 NOTE — Assessment & Plan Note (Signed)
Tolerating statin, encouraged heart healthy diet, avoid trans fats, minimize simple carbs and saturated fats. Increase exercise as tolerated 

## 2017-05-27 NOTE — Assessment & Plan Note (Signed)
hgba1c to be checked, minimize simple carbs. Increase exercise as tolerated. Continue current meds  

## 2017-05-27 NOTE — Assessment & Plan Note (Signed)
Well controlled, no changes to meds. Encouraged heart healthy diet such as the DASH diet and exercise as tolerated.  °

## 2017-05-27 NOTE — Patient Instructions (Signed)

## 2017-05-29 ENCOUNTER — Other Ambulatory Visit: Payer: BLUE CROSS/BLUE SHIELD

## 2017-06-05 ENCOUNTER — Other Ambulatory Visit: Payer: BLUE CROSS/BLUE SHIELD

## 2017-06-05 ENCOUNTER — Ambulatory Visit: Payer: BLUE CROSS/BLUE SHIELD

## 2017-06-07 ENCOUNTER — Ambulatory Visit (INDEPENDENT_AMBULATORY_CARE_PROVIDER_SITE_OTHER): Payer: BLUE CROSS/BLUE SHIELD

## 2017-06-07 ENCOUNTER — Other Ambulatory Visit (INDEPENDENT_AMBULATORY_CARE_PROVIDER_SITE_OTHER): Payer: BLUE CROSS/BLUE SHIELD

## 2017-06-07 ENCOUNTER — Ambulatory Visit: Payer: BLUE CROSS/BLUE SHIELD

## 2017-06-07 DIAGNOSIS — Z23 Encounter for immunization: Secondary | ICD-10-CM | POA: Diagnosis not present

## 2017-06-07 DIAGNOSIS — E538 Deficiency of other specified B group vitamins: Secondary | ICD-10-CM | POA: Diagnosis not present

## 2017-06-07 DIAGNOSIS — E559 Vitamin D deficiency, unspecified: Secondary | ICD-10-CM | POA: Diagnosis not present

## 2017-06-07 LAB — VITAMIN D 25 HYDROXY (VIT D DEFICIENCY, FRACTURES): VITD: 25.9 ng/mL — ABNORMAL LOW (ref 30.00–100.00)

## 2017-06-07 LAB — VITAMIN B12: VITAMIN B 12: 822 pg/mL (ref 211–911)

## 2017-06-12 ENCOUNTER — Other Ambulatory Visit: Payer: Self-pay

## 2017-06-12 MED ORDER — VITAMIN D (ERGOCALCIFEROL) 1.25 MG (50000 UNIT) PO CAPS: 50000.0000 [IU] | ORAL_CAPSULE | ORAL | 0 refills | Status: DC

## 2017-07-08 ENCOUNTER — Encounter: Payer: Self-pay | Admitting: Family

## 2017-07-08 ENCOUNTER — Ambulatory Visit (INDEPENDENT_AMBULATORY_CARE_PROVIDER_SITE_OTHER): Payer: BLUE CROSS/BLUE SHIELD | Admitting: Family

## 2017-07-08 VITALS — BP 134/73 | HR 71 | Temp 97.8°F | Resp 16 | Ht 66.0 in | Wt 235.0 lb

## 2017-07-08 DIAGNOSIS — R519 Headache, unspecified: Secondary | ICD-10-CM

## 2017-07-08 DIAGNOSIS — H6980 Other specified disorders of Eustachian tube, unspecified ear: Secondary | ICD-10-CM | POA: Diagnosis not present

## 2017-07-08 DIAGNOSIS — R51 Headache: Secondary | ICD-10-CM

## 2017-07-08 MED ORDER — KETOROLAC TROMETHAMINE 60 MG/2ML IM SOLN
60.0000 mg | Freq: Once | INTRAMUSCULAR | Status: AC
  Administered 2017-07-08: 60 mg via INTRAMUSCULAR

## 2017-07-08 NOTE — Progress Notes (Signed)
Subjective:    Patient ID: Felicia Solis, female    DOB: 11/20/1965, 52 y.o.   MRN: [id number]  HPI  [name] is a 52 yr old female who presents today.  HA- reports HA x 2 days.  Tylenol "just takes the edge of." No photophobia or nausea.  HA is on the top of her head. Earlier today HA was 10/10 now it is 5/10.    Otalgia-  Has had chills/sweats.  Ear pain x 2 days ago.  Temp 99.1 last night.  Took some theraflu which helped her to sleep.   Review of Systems See hpi  Past Medical History:  Diagnosis Date  . Anemia    anemia  when pregnant in 2002  . Anxiety   . Arthritis    "knees and back" (09/28/2016)  . Asthma 5 yrs ago    told she had asthma.. does not remember doctors name.   . Chronic lower back pain   . Depression    takes citalopram  . History of hiatal hernia   . History of kidney stones   . Hyperlipidemia   . Hypothyroidism   . LBBB (left bundle branch block)    rate-related; identified on stress test 03/21/16  . Migraine    "weekly" (09/28/2016)  . 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 Shortness of breath    with exertion  . Thyroid disease   . Type II diabetes mellitus ([place]) dx'd 2016     Social History   Socioeconomic History  . Marital status: Married    Spouse name: Not on file  . Number of children: 3  . Years of education: Not on file  . Highest education level: Not on file  Social Needs  . Financial resource strain: Not on file  . Food insecurity - worry: Not on file  . Food insecurity - inability: Not on file  . Transportation needs - medical: Not on file  . Transportation needs - non-medical: Not on file  Occupational History  . Occupation: CNA  Tobacco Use  . Smoking status: Never Smoker  . Smokeless tobacco: Never Used  Substance and Sexual Activity  . Alcohol use: No    Alcohol/week: 0.0 oz  . Drug use: No  . Sexual activity: Yes    Partners: Male    Birth  control/protection: Surgical  Other Topics Concern  . Not on file  Social History Narrative   No exercise--  Walking and lifting pts at work    Past Surgical History:  Procedure Laterality Date  . DILATION AND CURETTAGE OF UTERUS  1999  . ENDOMETRIAL ABLATION    . LAPAROSCOPY     years ago ..pt does not know where surgery was done ... states" my stomach would blow up then go down"  . THYROIDECTOMY  09/28/2011   Procedure: THYROIDECTOMY;  Surgeon: Izora Gala, MD;  Location: [facility];  Service: ENT;  Laterality: Right;  RIGHT THYROIDECTOMY WITH FROZEN SECTION  . THYROIDECTOMY  09/28/2016   completion of thyroidectomy/notes 09/28/2016  . THYROIDECTOMY Left 09/28/2016   Procedure: COMPLETION OF THYROIDECTOMY;  Surgeon: Izora Gala, MD;  Location: [facility];  Service: ENT;  Laterality: Left;  . TUBAL LIGATION      Family History  Problem Relation Age of Onset  . Diabetes Mother   . Hypertension Mother   . Hyperlipidemia Mother   . Gallbladder disease Mother   .  Heart disease Mother   . Diabetes Sister   . Hypertension Father   . Hyperlipidemia Father   . Colon polyps Father   . Ovarian cancer Maternal Grandmother   . Gallbladder disease Maternal Grandmother   . Colon cancer Paternal Grandfather   . Breast cancer Maternal Aunt   . Heart disease Other        early cad  . Diabetes Brother   . Heart attack Brother   . Anesthesia problems Neg Hx   . Hypotension Neg Hx   . Malignant hyperthermia Neg Hx   . Pseudochol deficiency Neg Hx   . Esophageal cancer Neg Hx     Allergies  Allergen Reactions  . Lisinopril Cough    Current Outpatient Medications on File Prior to Visit  Medication Sig Dispense Refill  . acetaminophen (TYLENOL) 500 MG tablet Take 1,000 mg by mouth every 6 (six) hours as needed for mild pain.    Marland Kitchen aspirin EC 81 MG tablet Take 1 tablet (81 mg total) by mouth daily. 90 tablet 3  . Biotin 5000 MCG CAPS Take 5,000 mcg by mouth daily.     . Cholecalciferol (VITAMIN D3)  2000 units TABS Take 2,000 Units by mouth daily.     . citalopram (CELEXA) 20 MG tablet Take 1 tablet (20 mg total) by mouth daily. 90 tablet 3  . cyanocobalamin 500 MCG tablet Take 500 mcg by mouth daily.    . cyclobenzaprine (FLEXERIL) 10 MG tablet Take 1 tablet (10 mg total) by mouth at bedtime. (Patient taking differently: Take 10 mg by mouth daily as needed for muscle spasms. ) 10 tablet 0  . Flaxseed, Linseed, (FLAXSEED OIL PO) Take 1,300 mg by mouth daily.    . fluconazole (DIFLUCAN) 150 MG tablet Take 1 by mouth once repeat in 3 days if needed. 2 tablet 0  . fluticasone (FLONASE) 50 MCG/ACT nasal spray Place 2 sprays into both nostrils daily. (Patient taking differently: Place 2 sprays into both nostrils daily as needed for allergies. ) 16 g 1  . Ginkgo Biloba Extract 60 MG CAPS Take 60 mg by mouth daily.    Marland Kitchen glucose blood (ONETOUCH VERIO) test strip Onetouch Verio Check blood sugar twice daily 100 each 12  . HYDROcodone-acetaminophen (NORCO) 7.5-325 MG tablet Take 1 tablet by mouth every 6 (six) hours as needed for moderate pain. 20 tablet 0  . ibuprofen (ADVIL,MOTRIN) 800 MG tablet TK 1 T PO  Q 6-8 H PRF PAIN  0  . levothyroxine (SYNTHROID) 100 MCG tablet Take 1 tablet (100 mcg total) by mouth daily. 30 tablet 6  . losartan (COZAAR) 25 MG tablet Take 1 tablet (25 mg total) by mouth daily. 90 tablet 1  . methocarbamol (ROBAXIN) 500 MG tablet Take 1 tablet (500 mg total) by mouth 2 (two) times daily. 20 tablet 0  . Omega-3 Fatty Acids (FISH OIL) 1200 MG CAPS Take 1,200 mg by mouth daily.    Glory Rosebush DELICA LANCETS FINE MISC Check blood sugar twice daily 100 each 12  . pantoprazole (PROTONIX) 40 MG tablet Take 1 tablet (40 mg total) by mouth daily. 90 tablet 3  . simvastatin (ZOCOR) 40 MG tablet Take 1 tablet (40 mg total) by mouth at bedtime. 90 tablet 1  . SitaGLIPtin-MetFORMIN HCl (JANUMET XR) [phone] MG TB24 TAKE 1 TABLET BY MOUTH ONCE DAILY 90 tablet 1  . Vitamin D, Ergocalciferol,  (DRISDOL) 50000 units CAPS capsule Take 1 capsule (50,000 Units total) by mouth every 7 (  seven) days. 12 capsule 0   No current facility-administered medications on file prior to visit.     BP 134/73 (BP Location: Right Arm, Patient Position: Sitting, Cuff Size: Large)   Pulse 71   Temp 97.8 F (36.6 C) (Oral)   Resp 16   Ht 5\' 6"  (1.676 m)   Wt 235 lb (106.6 kg)   SpO2 100%   BMI 37.93 kg/m       Objective:   Physical Exam  Constitutional: She is oriented to person, place, and time. She appears well-developed and well-nourished.  HENT:  Head: Normocephalic and atraumatic.  Right Ear: Tympanic membrane and ear canal normal.  Left Ear: Tympanic membrane and ear canal normal.  Mouth/Throat: No oropharyngeal exudate.  Cardiovascular: Normal rate, regular rhythm and normal heart sounds.  No murmur heard. Pulmonary/Chest: Effort normal and breath sounds normal. No respiratory distress. She has no wheezes.  Lymphadenopathy:    She has no cervical adenopathy.  Neurological: She is alert and oriented to person, place, and time.  Psychiatric: She has a normal mood and affect. Her behavior is normal. Judgment and thought content normal.          Assessment & Plan:  Eustachian Tube Dysfunction- Please begin flonase 2 sprays each nostril once daily for next few weeks. This should help with your ear pain.   HA- pt given Toradol injection. She is advised not to take any other nsaids today. And to let us know if your headache does not improve or if you develop new/worsening symptoms.

## 2017-07-08 NOTE — Patient Instructions (Addendum)
Please begin flonase 2 sprays each nostril once daily for next few weeks. This should help with your ear pain.  Let us know if your headache does not improve or if you develop new/worsening symptoms.

## 2017-07-09 ENCOUNTER — Ambulatory Visit: Payer: BLUE CROSS/BLUE SHIELD | Admitting: Family Medicine

## 2017-07-12 ENCOUNTER — Other Ambulatory Visit: Payer: BLUE CROSS/BLUE SHIELD

## 2017-07-12 ENCOUNTER — Ambulatory Visit: Payer: Self-pay

## 2017-07-31 ENCOUNTER — Ambulatory Visit: Payer: BLUE CROSS/BLUE SHIELD | Admitting: Obstetrics & Gynecology

## 2017-07-31 DIAGNOSIS — Z01419 Encounter for gynecological examination (general) (routine) without abnormal findings: Secondary | ICD-10-CM

## 2017-08-05 ENCOUNTER — Other Ambulatory Visit: Payer: Self-pay

## 2017-08-05 ENCOUNTER — Encounter: Payer: Self-pay | Admitting: Family

## 2017-08-05 ENCOUNTER — Emergency Department (HOSPITAL_BASED_OUTPATIENT_CLINIC_OR_DEPARTMENT_OTHER)
Admission: EM | Admit: 2017-08-05 | Discharge: 2017-08-06 | Disposition: A | Discharge status: Home / Self Care | Payer: BLUE CROSS/BLUE SHIELD | Attending: Emergency Medicine | Admitting: Emergency Medicine

## 2017-08-05 ENCOUNTER — Encounter (HOSPITAL_BASED_OUTPATIENT_CLINIC_OR_DEPARTMENT_OTHER): Payer: Self-pay | Admitting: *Deleted

## 2017-08-05 ENCOUNTER — Emergency Department (HOSPITAL_BASED_OUTPATIENT_CLINIC_OR_DEPARTMENT_OTHER): Payer: BLUE CROSS/BLUE SHIELD

## 2017-08-05 ENCOUNTER — Ambulatory Visit (INDEPENDENT_AMBULATORY_CARE_PROVIDER_SITE_OTHER): Payer: BLUE CROSS/BLUE SHIELD | Admitting: Family

## 2017-08-05 VITALS — BP 136/69 | HR 61 | Temp 98.4°F | Resp 16 | Ht 66.0 in | Wt 237.8 lb

## 2017-08-05 DIAGNOSIS — Z7982 Long term (current) use of aspirin: Secondary | ICD-10-CM | POA: Insufficient documentation

## 2017-08-05 DIAGNOSIS — R1031 Right lower quadrant pain: Secondary | ICD-10-CM | POA: Diagnosis not present

## 2017-08-05 DIAGNOSIS — E039 Hypothyroidism, unspecified: Secondary | ICD-10-CM | POA: Insufficient documentation

## 2017-08-05 DIAGNOSIS — E119 Type 2 diabetes mellitus without complications: Secondary | ICD-10-CM | POA: Insufficient documentation

## 2017-08-05 DIAGNOSIS — J45909 Unspecified asthma, uncomplicated: Secondary | ICD-10-CM | POA: Diagnosis not present

## 2017-08-05 DIAGNOSIS — K5790 Diverticulosis of intestine, part unspecified, without perforation or abscess without bleeding: Secondary | ICD-10-CM | POA: Diagnosis not present

## 2017-08-05 DIAGNOSIS — D259 Leiomyoma of uterus, unspecified: Secondary | ICD-10-CM | POA: Diagnosis not present

## 2017-08-05 DIAGNOSIS — Z7984 Long term (current) use of oral hypoglycemic drugs: Secondary | ICD-10-CM | POA: Insufficient documentation

## 2017-08-05 DIAGNOSIS — Z79899 Other long term (current) drug therapy: Secondary | ICD-10-CM | POA: Insufficient documentation

## 2017-08-05 DIAGNOSIS — R109 Unspecified abdominal pain: Secondary | ICD-10-CM | POA: Diagnosis not present

## 2017-08-05 LAB — COMPREHENSIVE METABOLIC PANEL WITH GFR
ALT: 27 U/L (ref 14–54)
AST: 21 U/L (ref 15–41)
Albumin: 3.8 g/dL (ref 3.5–5.0)
Alkaline Phosphatase: 49 U/L (ref 38–126)
Anion gap: 7 (ref 5–15)
BUN: 10 mg/dL (ref 6–20)
CO2: 26 mmol/L (ref 22–32)
Calcium: 8.9 mg/dL (ref 8.9–10.3)
Chloride: 105 mmol/L (ref 101–111)
Creatinine, Ser: 0.45 mg/dL (ref 0.44–1.00)
GFR calc Af Amer: >60 mL/min
GFR calc non Af Amer: >60 mL/min
Glucose, Bld: 148 mg/dL — ABNORMAL HIGH (ref 65–99)
Potassium: 3.6 mmol/L (ref 3.5–5.1)
Sodium: 138 mmol/L (ref 135–145)
Total Bilirubin: 0.1 mg/dL — ABNORMAL LOW (ref 0.3–1.2)
Total Protein: 7 g/dL (ref 6.5–8.1)

## 2017-08-05 LAB — URINALYSIS, ROUTINE W REFLEX MICROSCOPIC
Bilirubin Urine: NEGATIVE
Glucose, UA: NEGATIVE mg/dL
Ketones, ur: NEGATIVE mg/dL
Leukocytes, UA: NEGATIVE
Nitrite: NEGATIVE
Protein, ur: NEGATIVE mg/dL
Specific Gravity, Urine: 1.02 (ref 1.005–1.030)
pH: 6 (ref 5.0–8.0)

## 2017-08-05 LAB — POC URINALSYSI DIPSTICK (AUTOMATED)
BILIRUBIN UA: NEGATIVE
GLUCOSE UA: NEGATIVE
Ketones, UA: NEGATIVE
LEUKOCYTES UA: NEGATIVE
NITRITE UA: NEGATIVE
PH UA: 6 (ref 5.0–8.0)
Protein, UA: NEGATIVE
RBC UA: NEGATIVE
Spec Grav, UA: 1.025 (ref 1.010–1.025)
Urobilinogen, UA: 0.2 E.U./dL

## 2017-08-05 LAB — CBC WITH DIFFERENTIAL/PLATELET
BASOS ABS: 0 10*3/uL (ref 0.0–0.1)
BASOS PCT: 0 %
Eosinophils Absolute: 0.2 10*3/uL (ref 0.0–0.7)
Eosinophils Relative: 2 %
HEMATOCRIT: 36.7 % (ref 36.0–46.0)
Hemoglobin: 12.3 g/dL (ref 12.0–15.0)
Lymphocytes Relative: 49 %
Lymphs Abs: 4.9 10*3/uL — ABNORMAL HIGH (ref 0.7–4.0)
MCH: 28.8 pg (ref 26.0–34.0)
MCHC: 33.5 g/dL (ref 30.0–36.0)
MCV: 85.9 fL (ref 78.0–100.0)
MONO ABS: 0.8 10*3/uL (ref 0.1–1.0)
Monocytes Relative: 8 %
NEUTROS ABS: 4.2 10*3/uL (ref 1.7–7.7)
NEUTROS PCT: 41 %
Platelets: 292 10*3/uL (ref 150–400)
RBC: 4.27 MIL/uL (ref 3.87–5.11)
RDW: 13.2 % (ref 11.5–15.5)
WBC: 10.1 10*3/uL (ref 4.0–10.5)

## 2017-08-05 LAB — URINALYSIS, MICROSCOPIC (REFLEX)

## 2017-08-05 LAB — PREGNANCY, URINE: Preg Test, Ur: NEGATIVE

## 2017-08-05 LAB — POCT URINE PREGNANCY: Preg Test, Ur: NEGATIVE

## 2017-08-05 LAB — LIPASE, BLOOD: Lipase: 38 U/L (ref 11–51)

## 2017-08-05 MED ORDER — IOPAMIDOL (ISOVUE-300) INJECTION 61%
100.0000 mL | Freq: Once | INTRAVENOUS | Status: AC | PRN
  Administered 2017-08-05: 100 mL via INTRAVENOUS

## 2017-08-05 MED ORDER — LORAZEPAM 2 MG/ML IJ SOLN
1.0000 mg | Freq: Once | INTRAMUSCULAR | Status: AC | PRN
  Administered 2017-08-05: 1 mg via INTRAVENOUS
  Filled 2017-08-05: qty 1

## 2017-08-05 NOTE — Progress Notes (Signed)
Subjective:    Patient ID: Felicia Solis, female    DOB: 08/14/1965, 52 y.o.   MRN: [id number]  HPI  Reports that symptoms just started this AM around 9:45 AM.  Took some tylenol which did not help.  Pain is described as a dull ache, "nagging pain."  Denies dysuria, blood, constipation, fever, nausea/vomitting or diarrhea. She has her appendix.  Describes pain as 8/10    Review of Systems    see HPI  Past Medical History:  Diagnosis Date  . Anemia    anemia  when pregnant in 2002  . Anxiety   . Arthritis    "knees and back" (09/28/2016)  . Asthma 5 yrs ago    told she had asthma.. does not remember doctors name.   . Chronic lower back pain   . Depression    takes citalopram  . History of hiatal hernia   . History of kidney stones   . Hyperlipidemia   . Hypothyroidism   . LBBB (left bundle branch block)    rate-related; identified on stress test 03/21/16  . Migraine    "weekly" (09/28/2016)  . 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 Shortness of breath    with exertion  . Thyroid disease   . Type II diabetes mellitus ([place]) dx'd 2016     Social History   Socioeconomic History  . Marital status: Married    Spouse name: Not on file  . Number of children: 3  . Years of education: Not on file  . Highest education level: Not on file  Social Needs  . Financial resource strain: Not on file  . Food insecurity - worry: Not on file  . Food insecurity - inability: Not on file  . Transportation needs - medical: Not on file  . Transportation needs - non-medical: Not on file  Occupational History  . Occupation: CNA  Tobacco Use  . Smoking status: Never Smoker  . Smokeless tobacco: Never Used  Substance and Sexual Activity  . Alcohol use: No    Alcohol/week: 0.0 oz  . Drug use: No  . Sexual activity: Yes    Partners: Male    Birth control/protection: Surgical  Other Topics Concern  . Not on file  Social History  Narrative   No exercise--  Walking and lifting pts at work    Past Surgical History:  Procedure Laterality Date  . DILATION AND CURETTAGE OF UTERUS  1999  . ENDOMETRIAL ABLATION    . LAPAROSCOPY     years ago ..pt does not know where surgery was done ... states" my stomach would blow up then go down"  . THYROIDECTOMY  09/28/2011   Procedure: THYROIDECTOMY;  Surgeon: Izora Gala, MD;  Location: [facility];  Service: ENT;  Laterality: Right;  RIGHT THYROIDECTOMY WITH FROZEN SECTION  . THYROIDECTOMY  09/28/2016   completion of thyroidectomy/notes 09/28/2016  . THYROIDECTOMY Left 09/28/2016   Procedure: COMPLETION OF THYROIDECTOMY;  Surgeon: Izora Gala, MD;  Location: [facility];  Service: ENT;  Laterality: Left;  . TUBAL LIGATION      Family History  Problem Relation Age of Onset  . Diabetes Mother   . Hypertension Mother   . Hyperlipidemia Mother   . Gallbladder disease Mother   . Heart disease Mother   . Diabetes Sister   . Hypertension Father   . Hyperlipidemia Father   . Colon polyps Father   .  Ovarian cancer Maternal Grandmother   . Gallbladder disease Maternal Grandmother   . Colon cancer Paternal Grandfather   . Breast cancer Maternal Aunt   . Heart disease Other        early cad  . Diabetes Brother   . Heart attack Brother   . Anesthesia problems Neg Hx   . Hypotension Neg Hx   . Malignant hyperthermia Neg Hx   . Pseudochol deficiency Neg Hx   . Esophageal cancer Neg Hx     Allergies  Allergen Reactions  . Lisinopril Cough    Current Outpatient Medications on File Prior to Visit  Medication Sig Dispense Refill  . acetaminophen (TYLENOL) 500 MG tablet Take 1,000 mg by mouth every 6 (six) hours as needed for mild pain.    Marland Kitchen aspirin EC 81 MG tablet Take 1 tablet (81 mg total) by mouth daily. 90 tablet 3  . Biotin 5000 MCG CAPS Take 5,000 mcg by mouth daily.     . Cholecalciferol (VITAMIN D3) 2000 units TABS Take 2,000 Units by mouth daily.     . citalopram (CELEXA) 20 MG  tablet Take 1 tablet (20 mg total) by mouth daily. 90 tablet 3  . cyanocobalamin 500 MCG tablet Take 500 mcg by mouth daily.    . cyclobenzaprine (FLEXERIL) 10 MG tablet Take 1 tablet (10 mg total) by mouth at bedtime. (Patient taking differently: Take 10 mg by mouth daily as needed for muscle spasms. ) 10 tablet 0  . diclofenac (VOLTAREN) 75 MG EC tablet   0  . Flaxseed, Linseed, (FLAXSEED OIL PO) Take 1,300 mg by mouth daily.    . fluconazole (DIFLUCAN) 150 MG tablet Take 1 by mouth once repeat in 3 days if needed. 2 tablet 0  . fluticasone (FLONASE) 50 MCG/ACT nasal spray Place 2 sprays into both nostrils daily. (Patient taking differently: Place 2 sprays into both nostrils daily as needed for allergies. ) 16 g 1  . Ginkgo Biloba Extract 60 MG CAPS Take 60 mg by mouth daily.    Marland Kitchen glucose blood (ONETOUCH VERIO) test strip Onetouch Verio Check blood sugar twice daily 100 each 12  . HYDROcodone-acetaminophen (NORCO) 7.5-325 MG tablet Take 1 tablet by mouth every 6 (six) hours as needed for moderate pain. 20 tablet 0  . ibuprofen (ADVIL,MOTRIN) 800 MG tablet TK 1 T PO  Q 6-8 H PRF PAIN  0  . levothyroxine (SYNTHROID) 100 MCG tablet Take 1 tablet (100 mcg total) by mouth daily. 30 tablet 6  . losartan (COZAAR) 25 MG tablet Take 1 tablet (25 mg total) by mouth daily. 90 tablet 1  . methocarbamol (ROBAXIN) 500 MG tablet Take 1 tablet (500 mg total) by mouth 2 (two) times daily. 20 tablet 0  . Omega-3 Fatty Acids (FISH OIL) 1200 MG CAPS Take 1,200 mg by mouth daily.    Glory Rosebush DELICA LANCETS FINE MISC Check blood sugar twice daily 100 each 12  . pantoprazole (PROTONIX) 40 MG tablet Take 1 tablet (40 mg total) by mouth daily. 90 tablet 3  . simvastatin (ZOCOR) 40 MG tablet Take 1 tablet (40 mg total) by mouth at bedtime. 90 tablet 1  . SitaGLIPtin-MetFORMIN HCl (JANUMET XR) 519 [id number] MG TB24 TAKE 1 TABLET BY MOUTH ONCE DAILY 90 tablet 1  . Vitamin D, Ergocalciferol, (DRISDOL) 50000 units CAPS capsule  Take 1 capsule (50,000 Units total) by mouth every 7 (seven) days. 12 capsule 0   No current facility-administered medications on file prior to visit.  BP 136/69 (BP Location: Right Arm, Patient Position: Sitting, Cuff Size: Large)   Pulse 61   Temp 98.4 F (36.9 C) (Oral)   Resp 16   Ht 5\' 6"  (1.676 m)   Wt 237 lb 12.8 oz (107.9 kg)   SpO2 100%   BMI 38.38 kg/m    Objective:   Physical Exam  Constitutional: She appears well-developed and well-nourished.  Cardiovascular: Normal rate, regular rhythm and normal heart sounds.  No murmur heard. Pulmonary/Chest: Effort normal and breath sounds normal. No respiratory distress. She has no wheezes.  Abdominal: Soft. Bowel sounds are normal. There is tenderness in the right lower quadrant.  Psychiatric: She has a normal mood and affect. Her behavior is normal. Judgment and thought content normal.          Assessment & Plan:  RLQ tenderness- new. UA unremarkable. Needs CT abd/pelvis to rule out appendicitis. Unable to get serum cr in a timely manner at hour of office visit to schedule CT. Pt was referred to ED for further evaluation. Report was given to  Dr. Sherry Ruffing (MD on duty at [facility] ED).

## 2017-08-05 NOTE — ED Triage Notes (Signed)
Pt c/o right lower abd pain x 8 hrs

## 2017-08-05 NOTE — Discharge Instructions (Signed)
Your evaluation is reassuring, labs look good overall and CT scan shows no evidence of appendicitis or other acute intra-abdominal problems.  The scan did show incidental finding of diverticulosis, as well as some small uterine fibroids.  Follow-up with your primary care provider.  If you develop severe focal abdominal pain, fevers or chills, nausea vomiting or other new or concerning symptoms please return to the ED for reevaluation.

## 2017-08-05 NOTE — ED Provider Notes (Signed)
[facility] EMERGENCY DEPARTMENT Provider Note   CSN: [id number] Arrival date & time: 08/05/17  1701     History   Chief Complaint Chief Complaint  Patient presents with  . Abdominal Pain    HPI  Felicia Solis is a 52 y.o. Female with a history of asthma, hyperlipidemia, thyroid disease, diabetes, left bundle branch block kidney stones, presents to the ED from her primary care doctor's office for rule out of appendicitis.  Patient reports acute onset of right lower quadrant pain around 945 this morning.  Patient describes pain as a dull constant ache that is nagging.  Patient reports pain has been constant, she does report pain improved slightly while she was in the waiting room, initially 8/10 on onset now approximately 3/10.  Patient tried some Tylenol without improvement, no other aggravating or alleviating factors.  Patient denies any associated fevers or chills, nausea, vomiting, diarrhea or blood in the stools.  Patient denies any urinary symptoms, reports this does not feel like her previous kidney stones.  Denies any pelvic pain, vaginal discharge or vaginal bleeding.      Past Medical History:  Diagnosis Date  . Anemia    anemia  when pregnant in 2002  . Anxiety   . Arthritis    "knees and back" (09/28/2016)  . Asthma 5 yrs ago    told she had asthma.. does not remember doctors name.   . Chronic lower back pain   . Depression    takes citalopram  . History of hiatal hernia   . History of kidney stones   . Hyperlipidemia   . Hypothyroidism   . LBBB (left bundle branch block)    rate-related; identified on stress test 03/21/16  . Migraine    "weekly" (09/28/2016)  . 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 Shortness of breath    with exertion  . Thyroid disease   . Type II diabetes mellitus ([place]) dx'd 2016    Patient Active Problem List   Diagnosis Date Noted  . Other intervertebral disc  degeneration, lumbar region 04/07/2017  . Left hip pain 04/07/2017  . Hyperlipidemia LDL goal <70 04/07/2017  . Essential hypertension 04/07/2017  . Acute vaginitis 04/07/2017  . S/P complete thyroidectomy 09/28/2016  . Memory loss 08/23/2016  . Thyroid nodule 08/02/2016  . Plantar fasciitis of right foot 12/01/2015  . Knee pain, right 12/01/2015  . Headache, migraine 09/06/2015  . Bilateral knee pain 08/22/2015  . Thoracic myofascial strain 08/22/2015  . Dysphagia 04/10/2015  . Atypical chest pain 04/10/2015  . Epigastric pain 04/10/2015  . Functional diarrhea 04/10/2015  . NSAID long-term use 04/10/2015  . Midepigastric pain 03/22/2015  . Cough 01/07/2015  . Allergic rhinitis 12/13/2014  . Acute bronchitis 12/13/2014  . Blood in stool 10/01/2014  . DM (diabetes mellitus) type II uncontrolled, periph vascular disorder ([place]) 08/13/2014  . Abnormal ECG 05/14/2012  . Breast pain 10/23/2011  . ASCUS on Pap smear 10/08/2011  . H/O thyroid nodule 08/27/2011  . Depression with anxiety 08/27/2011  . Heavy periods 08/27/2011    Past Surgical History:  Procedure Laterality Date  . DILATION AND CURETTAGE OF UTERUS  1999  . ENDOMETRIAL ABLATION    . LAPAROSCOPY     years ago ..pt does not know where surgery was done ... states" my stomach would blow up then go down"  . THYROIDECTOMY  09/28/2011   Procedure:  THYROIDECTOMY;  Surgeon: Izora Gala, MD;  Location: [facility];  Service: ENT;  Laterality: Right;  RIGHT THYROIDECTOMY WITH FROZEN SECTION  . THYROIDECTOMY  09/28/2016   completion of thyroidectomy/notes 09/28/2016  . THYROIDECTOMY Left 09/28/2016   Procedure: COMPLETION OF THYROIDECTOMY;  Surgeon: Izora Gala, MD;  Location: [facility];  Service: ENT;  Laterality: Left;  . TUBAL LIGATION      OB History    Gravida Para Term Preterm AB Living   5 3 3   2 3    SAB TAB Ectopic Multiple Live Births   2               Home Medications    Prior to Admission medications   Medication Sig  Start Date End Date Taking? Authorizing Provider  acetaminophen (TYLENOL) 500 MG tablet Take 1,000 mg by mouth every 6 (six) hours as needed for mild pain.    [provider]  aspirin EC 81 MG tablet Take 1 tablet (81 mg total) by mouth daily. 03/07/16   End, Harrell Gave, MD  Biotin 5000 MCG CAPS Take 5,000 mcg by mouth daily.     [provider]  Cholecalciferol (VITAMIN D3) 2000 units TABS Take 2,000 Units by mouth daily.     [provider]  citalopram (CELEXA) 20 MG tablet Take 1 tablet (20 mg total) by mouth daily. 05/27/17   [name], DO  cyanocobalamin 500 MCG tablet Take 500 mcg by mouth daily.    [provider]  cyclobenzaprine (FLEXERIL) 10 MG tablet Take 1 tablet (10 mg total) by mouth at bedtime. Patient taking differently: Take 10 mg by mouth daily as needed for muscle spasms.  08/21/16   [name], DO  diclofenac (VOLTAREN) 75 MG EC tablet  08/05/17   [provider]  Flaxseed, Linseed, (FLAXSEED OIL PO) Take 1,300 mg by mouth daily.    [provider]  fluconazole (DIFLUCAN) 150 MG tablet Take 1 by mouth once repeat in 3 days if needed. 04/11/17   Roma Schanz R, DO  fluticasone (FLONASE) 50 MCG/ACT nasal spray Place 2 sprays into both nostrils daily. Patient taking differently: Place 2 sprays into both nostrils daily as needed for allergies.  05/28/16   Saguier, Percell Miller, PA-C  Ginkgo Biloba Extract 60 MG CAPS Take 60 mg by mouth daily.    [provider]  glucose blood (ONETOUCH VERIO) test strip Onetouch Verio Check blood sugar twice daily 05/27/17   Carollee Herter, Alferd Apa, DO  HYDROcodone-acetaminophen (NORCO) 7.5-325 MG tablet Take 1 tablet by mouth every 6 (six) hours as needed for moderate pain. 09/28/16   Izora Gala, MD  ibuprofen (ADVIL,MOTRIN) 800 MG tablet TK 1 T PO  Q 6-8 H PRF PAIN 01/21/17   [provider]  levothyroxine (SYNTHROID) 100 MCG tablet Take 1 tablet (100 mcg  total) by mouth daily. 09/28/16   Izora Gala, MD  losartan (COZAAR) 25 MG tablet Take 1 tablet (25 mg total) by mouth daily. 05/27/17   [name], DO  methocarbamol (ROBAXIN) 500 MG tablet Take 1 tablet (500 mg total) by mouth 2 (two) times daily. 01/13/17   Larene Pickett, PA-C  Omega-3 Fatty Acids (FISH OIL) 1200 MG CAPS Take 1,200 mg by mouth daily.    [provider]  [facility] LANCETS FINE MISC Check blood sugar twice daily 08/26/14   Carollee Herter, Kendrick Fries R, DO  pantoprazole (PROTONIX) 40 MG tablet Take 1 tablet (40 mg  total) by mouth daily. 05/27/17   [name], DO  simvastatin (ZOCOR) 40 MG tablet Take 1 tablet (40 mg total) by mouth at bedtime. 05/27/17   [name], DO  SitaGLIPtin-MetFORMIN HCl (JANUMET XR) [id number] MG TB24 TAKE 1 TABLET BY MOUTH ONCE DAILY 05/27/17   [name], DO  Vitamin D, Ergocalciferol, (DRISDOL) 50000 units CAPS capsule Take 1 capsule (50,000 Units total) by mouth every 7 (seven) days. 06/12/17   [name], DO    Family History Family History  Problem Relation Age of Onset  . Diabetes Mother   . Hypertension Mother   . Hyperlipidemia Mother   . Gallbladder disease Mother   . Heart disease Mother   . Diabetes Sister   . Hypertension Father   . Hyperlipidemia Father   . Colon polyps Father   . Ovarian cancer Maternal Grandmother   . Gallbladder disease Maternal Grandmother   . Colon cancer Paternal Grandfather   . Breast cancer Maternal Aunt   . Heart disease Other        early cad  . Diabetes Brother   . Heart attack Brother   . Anesthesia problems Neg Hx   . Hypotension Neg Hx   . Malignant hyperthermia Neg Hx   . Pseudochol deficiency Neg Hx   . Esophageal cancer Neg Hx     Social History Social History   Tobacco Use  . Smoking status: Never Smoker  . Smokeless tobacco: Never Used  Substance Use Topics  . Alcohol use: No    Alcohol/week: 0.0 oz  . Drug use: No      Allergies   Lisinopril   Review of Systems Review of Systems  Constitutional: Negative for chills and fever.  HENT: Negative for congestion, rhinorrhea and sore throat.   Eyes: Negative for visual disturbance.  Respiratory: Negative for cough, chest tightness and shortness of breath.   Cardiovascular: Negative for chest pain.  Gastrointestinal: Positive for abdominal pain. Negative for blood in stool, diarrhea, nausea and vomiting.  Genitourinary: Negative for dysuria, flank pain, frequency, pelvic pain, vaginal bleeding, vaginal discharge and vaginal pain.  Musculoskeletal: Negative for back pain and myalgias.  Skin: Negative for rash and wound.  Neurological: Negative for dizziness and light-headedness.     Physical Exam Updated Vital Signs BP (!) 128/93   Pulse 70   Temp 98.9 F (37.2 C)   Resp 18   Ht 5\' 6"  (1.676 m)   Wt 107.5 kg (237 lb)   SpO2 98%   BMI 38.25 kg/m   Physical Exam  Constitutional: She appears well-developed and well-nourished. No distress.  HENT:  Head: Normocephalic and atraumatic.  Mouth/Throat: Oropharynx is clear and moist.  Eyes: Right eye exhibits no discharge. Left eye exhibits no discharge.  Neck: Neck supple.  Cardiovascular: Normal rate, regular rhythm, normal heart sounds and intact distal pulses.  Pulmonary/Chest: Effort normal and breath sounds normal. No stridor. No respiratory distress. She has no wheezes. She has no rales.  Abdominal: Soft. Bowel sounds are normal.  Abdomen soft, nondistended, bowel sounds present, focal tenderness in the right lower quadrant with minimal guarding, rebound tenderness, all other quadrants nontender to palpation, negative obturator and psoas sign, negative Rovsing sign, no CVA tenderness  Musculoskeletal: She exhibits no edema or deformity.  Neurological: She is alert. Coordination normal.  Skin: Skin is warm and dry. Capillary refill takes less than 2 seconds. She is not diaphoretic.   Psychiatric: She has a  normal mood and affect. Her behavior is normal.  Nursing note and vitals reviewed.    ED Treatments / Results  Labs (all labs ordered are listed, but only abnormal results are displayed) Labs Reviewed  URINALYSIS, ROUTINE W REFLEX MICROSCOPIC - Abnormal; Notable for the following components:      Result Value   Hgb urine dipstick TRACE (*)    All other components within normal limits  URINALYSIS, MICROSCOPIC (REFLEX) - Abnormal; Notable for the following components:   Bacteria, UA RARE (*)    Squamous Epithelial / LPF 0-5 (*)    All other components within normal limits  CBC WITH DIFFERENTIAL/PLATELET - Abnormal; Notable for the following components:   Lymphs Abs 4.9 (*)    All other components within normal limits  COMPREHENSIVE METABOLIC PANEL - Abnormal; Notable for the following components:   Glucose, Bld 148 (*)    Total Bilirubin 0.1 (*)    All other components within normal limits  PREGNANCY, URINE  LIPASE, BLOOD    EKG  EKG Interpretation None       Radiology Ct Abdomen Pelvis W Contrast  Result Date: 08/05/2017 CLINICAL DATA:  Acute onset of right lower quadrant abdominal pain. EXAM: CT ABDOMEN AND PELVIS WITH CONTRAST TECHNIQUE: Multidetector CT imaging of the abdomen and pelvis was performed using the standard protocol following bolus administration of intravenous contrast. CONTRAST:  131mL ISOVUE-300 IOPAMIDOL (ISOVUE-300) INJECTION 61% COMPARISON:  CT of the abdomen and pelvis from 08/30/2011 FINDINGS: Lower chest: The visualized lung bases are grossly clear. The visualized portions of the mediastinum are unremarkable. Hepatobiliary: The liver is unremarkable in appearance. The gallbladder is unremarkable in appearance. The common bile duct remains normal in caliber. Pancreas: The pancreas is within normal limits. Spleen: The spleen is unremarkable in appearance. Adrenals/Urinary Tract: The adrenal glands are unremarkable in appearance. The  kidneys are within normal limits. There is no evidence of hydronephrosis. No renal or ureteral stones are identified. No perinephric stranding is seen. Stomach/Bowel: The stomach is unremarkable in appearance. The small bowel is within normal limits. The appendix is normal in caliber, seen adjacent to the inferior tip of the liver, without evidence of appendicitis. Mild scattered diverticulosis is noted along the distal descending and proximal sigmoid colon, without evidence of diverticulitis. Vascular/Lymphatic: The abdominal aorta is unremarkable in appearance. The inferior vena cava is grossly unremarkable. No retroperitoneal lymphadenopathy is seen. No pelvic sidewall lymphadenopathy is identified. Reproductive: The bladder is mildly distended and grossly unremarkable. Small calcified uterine fibroids are noted. The uterus is otherwise unremarkable. The ovaries are relatively symmetric. No suspicious adnexal masses are seen. Other: No additional soft tissue abnormalities are seen. Musculoskeletal: No acute osseous abnormalities are identified. The visualized musculature is unremarkable in appearance. IMPRESSION: 1. No acute abnormality seen within the abdomen or pelvis. 2. Mild scattered diverticulosis along the distal descending and proximal sigmoid colon, without evidence of diverticulitis. 3. Small calcified uterine fibroids noted. Electronically Signed   By: Garald Balding M.D.   On: 08/05/2017 22:47    Procedures Procedures (including critical care time)  Medications Ordered in ED Medications  LORazepam (ATIVAN) injection 1 mg (1 mg Intravenous Given 08/05/17 2205)  iopamidol (ISOVUE-300) 61 % injection 100 mL (100 mLs Intravenous Contrast Given 08/05/17 2227)     Initial Impression / Assessment and Plan / ED Course  I have reviewed the triage vital signs and the nursing notes.  Pertinent labs & imaging results that were available during my care of the patient were reviewed  by me and  considered in my medical decision making (see chart for details).  Patient presents from PCPs office for rule out of appendicitis, acute onset of right lower quadrant pain this morning.  No associated fevers, nausea vomiting, diarrhea, urinary symptoms or vaginal discharge.  Vitals are normal and patient is overall well-appearing, pain improved while patient was here waiting in the waiting room.  On exam there is some tenderness in the right lower quadrant with minimal guarding no rebound tenderness present.  Will get abdominal labs in CT scan to rule out appendicitis or other acute abdominal pathology.  Hold off on pain medicine at this time since pain has improved.  Labs overall reassuring, no leukocytosis and normal hemoglobin, no acute electrolyte derangements requiring intervention, kidney and liver function normal, glucose is slightly elevated at 148, but patient was not fasting prior to labs.  Lipase normal.  Urine without evidence of infection. Negative pregnancy.  CT scan shows no acute abnormalities within the abdomen or pelvis, incidental findings of some scattered diverticulosis without any evidence of diverticulitis, as well as some small uterine fibroids, discussed these results with the patient.  At this time she is stable for discharge home close follow-up with her primary care doctor.  Strict return precautions discussed regarding worsening abdominal pain, fevers or chills, nausea vomiting or blood in the stool.  Ample time provided for patient to ask questions, no further questions at discharge, patient expressed understanding and is in agreement with plan.  Final Clinical Impressions(s) / ED Diagnoses   Final diagnoses:  Right lower quadrant abdominal pain  Diverticulosis of intestine without bleeding, unspecified intestinal tract location  Uterine leiomyoma, unspecified location    ED Discharge Orders    None       Janet Berlin 08/06/17 0023    Tegeler,  Gwenyth Allegra, MD 08/06/17 [phone]

## 2017-08-05 NOTE — Patient Instructions (Addendum)
Please go to the ER on the first floor for further evaluation.

## 2017-08-06 NOTE — ED Notes (Signed)
Pt discharged to home with family. NAD.  

## 2017-09-08 ENCOUNTER — Other Ambulatory Visit: Payer: Self-pay | Admitting: Family Medicine

## 2017-09-10 NOTE — Telephone Encounter (Signed)
Do you want her to continue? 

## 2017-09-11 ENCOUNTER — Ambulatory Visit (INDEPENDENT_AMBULATORY_CARE_PROVIDER_SITE_OTHER): Payer: BLUE CROSS/BLUE SHIELD | Admitting: Obstetrics & Gynecology

## 2017-09-11 ENCOUNTER — Encounter: Payer: Self-pay | Admitting: Obstetrics & Gynecology

## 2017-09-11 VITALS — BP 106/71 | HR 76 | Ht 66.5 in | Wt 230.0 lb

## 2017-09-11 DIAGNOSIS — Z124 Encounter for screening for malignant neoplasm of cervix: Secondary | ICD-10-CM | POA: Diagnosis not present

## 2017-09-11 DIAGNOSIS — B373 Candidiasis of vulva and vagina: Secondary | ICD-10-CM | POA: Diagnosis not present

## 2017-09-11 DIAGNOSIS — Z1151 Encounter for screening for human papillomavirus (HPV): Secondary | ICD-10-CM

## 2017-09-11 DIAGNOSIS — Z01419 Encounter for gynecological examination (general) (routine) without abnormal findings: Secondary | ICD-10-CM

## 2017-09-11 DIAGNOSIS — B3731 Acute candidiasis of vulva and vagina: Secondary | ICD-10-CM

## 2017-09-11 DIAGNOSIS — N9089 Other specified noninflammatory disorders of vulva and perineum: Secondary | ICD-10-CM

## 2017-09-11 DIAGNOSIS — Z113 Encounter for screening for infections with a predominantly sexual mode of transmission: Secondary | ICD-10-CM

## 2017-09-11 DIAGNOSIS — Z01411 Encounter for gynecological examination (general) (routine) with abnormal findings: Secondary | ICD-10-CM | POA: Diagnosis not present

## 2017-09-11 NOTE — Patient Instructions (Signed)
Vaginitis Vaginitis is a condition in which the vaginal tissue swells and becomes red (inflamed). This condition is most often caused by a change in the normal balance of bacteria and yeast that live in the vagina. This change causes an overgrowth of certain bacteria or yeast, which causes the inflammation. There are different types of vaginitis, but the most common types are:  Bacterial vaginosis.  Yeast infection (candidiasis).  Trichomoniasis vaginitis. This is a sexually transmitted disease (STD).  Viral vaginitis.  Atrophic vaginitis.  Allergic vaginitis.  What are the causes? The cause of this condition depends on the type of vaginitis. It can be caused by:  Bacteria (bacterial vaginosis).  Yeast, which is a fungus (yeast infection).  A parasite (trichomoniasis vaginitis).  A virus (viral vaginitis).  Low hormone levels (atrophic vaginitis). Low hormone levels can occur during pregnancy, breastfeeding, or after menopause.  Irritants, such as bubble baths, scented tampons, and feminine sprays (allergic vaginitis).  Other factors can change the normal balance of the yeast and bacteria that live in the vagina. These include:  Antibiotic medicines.  Poor hygiene.  Diaphragms, vaginal sponges, spermicides, birth control pills, and intrauterine devices (IUD).  Sex.  Infection.  Uncontrolled diabetes.  A weakened defense (immune) system.  What increases the risk? This condition is more likely to develop in women who:  Smoke.  Use vaginal douches, scented tampons, or scented sanitary pads.  Wear tight-fitting pants.  Wear thong underwear.  Use oral birth control pills or an IUD.  Have sex without a condom.  Have multiple sex partners.  Have an STD.  Frequently use the spermicide nonoxynol-9.  Eat lots of foods high in sugar.  Have uncontrolled diabetes.  Have low estrogen levels.  Have a weakened immune system from an immune disorder or medical  treatment.  Are pregnant or breastfeeding.  What are the signs or symptoms? Symptoms vary depending on the cause of the vaginitis. Common symptoms include:  Abnormal vaginal discharge. ? The discharge is white, gray, or yellow with bacterial vaginosis. ? The discharge is thick, white, and cheesy with a yeast infection. ? The discharge is frothy and yellow or greenish with trichomoniasis.  A bad vaginal smell. The smell is fishy with bacterial vaginosis.  Vaginal itching, pain, or swelling.  Sex that is painful.  Pain or burning when urinating.  Sometimes there are no symptoms. How is this diagnosed? This condition is diagnosed based on your symptoms and medical history. A physical exam, including a pelvic exam, will also be done. You may also have other tests, including:  Tests to determine the pH level (acidity or alkalinity) of your vagina.  A whiff test, to assess the odor that results when a sample of your vaginal discharge is mixed with a potassium hydroxide solution.  Tests of vaginal fluid. A sample will be examined under a microscope.  How is this treated? Treatment varies depending on the type of vaginitis you have. Your treatment may include:  Antibiotic creams or pills to treat bacterial vaginosis and trichomoniasis.  Antifungal medicines, such as vaginal creams or suppositories, to treat a yeast infection.  Medicine to ease discomfort if you have viral vaginitis. Your sexual partner should also be treated.  Estrogen delivered in a cream, pill, suppository, or vaginal ring to treat atrophic vaginitis. If vaginal dryness occurs, lubricants and moisturizing creams may help. You may need to avoid scented soaps, sprays, or douches.  Stopping use of a product that is causing allergic vaginitis. Then using a vaginal  cream to treat the symptoms.  Follow these instructions at home: Lifestyle  Keep your genital area clean and dry. Avoid soap, and only rinse the area  with water.  Do not douche or use tampons until your health care provider says it is okay to do so. Use sanitary pads, if needed.  Do not have sex until your health care provider approves. When you can return to sex, practice safe sex and use condoms.  Wipe from front to back. This avoids the spread of bacteria from the rectum to the vagina. General instructions  Take over-the-counter and prescription medicines only as told by your health care provider.  If you were prescribed an antibiotic medicine, take or use it as told by your health care provider. Do not stop taking or using the antibiotic even if you start to feel better.  Keep all follow-up visits as told by your health care provider. This is important. How is this prevented?  Use mild, non-scented products. Do not use things that can irritate the vagina, such as fabric softeners. Avoid the following products if they are scented: ? Feminine sprays. ? Detergents. ? Tampons. ? Feminine hygiene products. ? Soaps or bubble baths.  Let air reach your genital area. ? Wear cotton underwear to reduce moisture buildup. ? Avoid wearing underwear while you sleep. ? Avoid wearing tight pants and underwear or nylons without a cotton panel. ? Avoid wearing thong underwear.  Take off any wet clothing, such as bathing suits, as soon as possible.  Practice safe sex and use condoms. Contact a health care provider if:  You have abdominal pain.  You have a fever.  You have symptoms that last for more than 2-3 days. Get help right away if:  You have a fever and your symptoms suddenly get worse. Summary  Vaginitis is a condition in which the vaginal tissue becomes inflamed.This condition is most often caused by a change in the normal balance of bacteria and yeast that live in the vagina.  Treatment varies depending on the type of vaginitis you have.  Do not douche, use tampons , or have sex until your health care provider approves.  When you can return to sex, practice safe sex and use condoms. This information is not intended to replace advice given to you by your health care provider. Make sure you discuss any questions you have with your health care provider. Document Released: 04/08/2007 Document Revised: 07/17/2016 Document Reviewed: 07/17/2016 Elsevier Interactive Patient Education  2018 [employer]. Atrophic Vaginitis Atrophic vaginitis is a condition in which the tissues that line the vagina become dry and thin. This condition is most common in women who have stopped having regular menstrual periods (menopause). This usually starts when a woman is 59-69 years old. Estrogen helps to keep the vagina moist. It stimulates the vagina to produce a clear fluid that lubricates the vagina for sexual intercourse. This fluid also protects the vagina from infection. Lack of estrogen can cause the lining of the vagina to get thinner and dryer. The vagina may also shrink in size. It may become less elastic. Atrophic vaginitis tends to get worse over time as a woman's estrogen level drops. What are the causes? This condition is caused by the normal drop in estrogen that happens around the time of menopause. What increases the risk? Certain conditions or situations may lower a woman's estrogen level, which increases her risk of atrophic vaginitis. These include:  Taking medicine that blocks estrogen.  Having ovaries removed  surgically.  Being treated for cancer with X-ray treatment (radiation) or medicines (chemotherapy).  Exercising very hard and often.  Having an eating disorder (anorexia).  Giving birth or breastfeeding.  Being over the age of 48.  Smoking.  What are the signs or symptoms? Symptoms of this condition include:  Pain, soreness, or bleeding during sexual intercourse (dyspareunia).  Vaginal burning, irritation, or itching.  Pain or bleeding during a vaginal examination using a speculum (pelvic  exam).  Loss of interest in sexual activity.  Having burning pain when passing urine.  Vaginal discharge that is brown or yellow.  In some cases, there are no symptoms. How is this diagnosed? This condition is diagnosed with a medical history and physical exam. This will include a pelvic exam that checks whether the inside of your vagina appears pale, thin, or dry. Rarely, you may also have other tests, including:  A urine test.  A test that checks the acid balance in your vaginal fluid (acid balance test).  How is this treated? Treatment for this condition may depend on the severity of your symptoms. Treatment may include:  Using an over-the-counter vaginal lubricant before you have sexual intercourse.  Using a long-acting vaginal moisturizer.  Using low-dose vaginal estrogen for moderate to severe symptoms that do not respond to other treatments. Options include creams, tablets, and inserts (vaginal rings). Before using vaginal estrogen, tell your health care provider if you have a history of: ? Breast cancer. ? Endometrial cancer. ? Blood clots.  Taking medicines. You may be able to take a daily pill for dyspareunia. Discuss all of the risks of this medicine with your health care provider. It is usually not recommended for women who have a family history or personal history of breast cancer.  If your symptoms are very mild and you are not sexually active, you may not need treatment. Follow these instructions at home:  Take medicines only as directed by your health care provider. Do not use herbal or alternative medicines unless your health care provider says that you can.  Use over-the-counter creams, lubricants, or moisturizers for dryness only as directed by your health care provider.  If your atrophic vaginitis is caused by menopause, discuss all of your menopausal symptoms and treatment options with your health care provider.  Do not douche.  Do not use products that  can make your vagina dry. These include: ? Scented feminine sprays. ? Scented tampons. ? Scented soaps.  If it hurts to have sex, talk with your sexual partner. Contact a health care provider if:  Your discharge looks different than normal.  Your vagina has an unusual smell.  You have new symptoms.  Your symptoms do not improve with treatment.  Your symptoms get worse. This information is not intended to replace advice given to you by your health care provider. Make sure you discuss any questions you have with your health care provider. Document Released: 10/26/2014 Document Revised: 11/17/2015 Document Reviewed: 06/02/2014 Elsevier Interactive Patient Education  2018 [employer] Years, Female Preventive care refers to lifestyle choices and visits with your health care provider that can promote health and wellness. What does preventive care include?  A yearly physical exam. This is also called an annual well check.  Dental exams once or twice a year.  Routine eye exams. Ask your health care provider how often you should have your eyes checked.  Personal lifestyle choices, including: ? Daily care of your teeth and gums. ? Regular physical activity. ?  Eating a healthy diet. ? Avoiding tobacco and drug use. ? Limiting alcohol use. ? Practicing safe sex. ? Taking low-dose aspirin daily starting at age 65. ? Taking vitamin and mineral supplements as recommended by your health care provider. What happens during an annual well check? The services and screenings done by your health care provider during your annual well check will depend on your age, overall health, lifestyle risk factors, and family history of disease. Counseling Your health care provider may ask you questions about your:  Alcohol use.  Tobacco use.  Drug use.  Emotional well-being.  Home and relationship well-being.  Sexual activity.  Eating habits.  Work and work  Statistician.  Method of birth control.  Menstrual cycle.  Pregnancy history.  Screening You may have the following tests or measurements:  Height, weight, and BMI.  Blood pressure.  Lipid and cholesterol levels. These may be checked every 5 years, or more frequently if you are over 62 years old.  Skin check.  Lung cancer screening. You may have this screening every year starting at age 29 if you have a 30-pack-year history of smoking and currently smoke or have quit within the past 15 years.  Fecal occult blood test (FOBT) of the stool. You may have this test every year starting at age 67.  Flexible sigmoidoscopy or colonoscopy. You may have a sigmoidoscopy every 5 years or a colonoscopy every 10 years starting at age 71.  Hepatitis C blood test.  Hepatitis B blood test.  Sexually transmitted disease (STD) testing.  Diabetes screening. This is done by checking your blood sugar (glucose) after you have not eaten for a while (fasting). You may have this done every 1-3 years.  Mammogram. This may be done every 1-2 years. Talk to your health care provider about when you should start having regular mammograms. This may depend on whether you have a family history of breast cancer.  BRCA-related cancer screening. This may be done if you have a family history of breast, ovarian, tubal, or peritoneal cancers.  Pelvic exam and Pap test. This may be done every 3 years starting at age 48. Starting at age 63, this may be done every 5 years if you have a Pap test in combination with an HPV test.  Bone density scan. This is done to screen for osteoporosis. You may have this scan if you are at high risk for osteoporosis.  Discuss your test results, treatment options, and if necessary, the need for more tests with your health care provider. Vaccines Your health care provider may recommend certain vaccines, such as:  Influenza vaccine. This is recommended every year.  Tetanus,  diphtheria, and acellular pertussis (Tdap, Td) vaccine. You may need a Td booster every 10 years.  Varicella vaccine. You may need this if you have not been vaccinated.  Zoster vaccine. You may need this after age 67.  Measles, mumps, and rubella (MMR) vaccine. You may need at least one dose of MMR if you were born in 1957 or later. You may also need a second dose.  Pneumococcal 13-valent conjugate (PCV13) vaccine. You may need this if you have certain conditions and were not previously vaccinated.  Pneumococcal polysaccharide (PPSV23) vaccine. You may need one or two doses if you smoke cigarettes or if you have certain conditions.  Meningococcal vaccine. You may need this if you have certain conditions.  Hepatitis A vaccine. You may need this if you have certain conditions or if you travel or work in  places where you may be exposed to hepatitis A.  Hepatitis B vaccine. You may need this if you have certain conditions or if you travel or work in places where you may be exposed to hepatitis B.  Haemophilus influenzae type b (Hib) vaccine. You may need this if you have certain conditions.  Talk to your health care provider about which screenings and vaccines you need and how often you need them. This information is not intended to replace advice given to you by your health care provider. Make sure you discuss any questions you have with your health care provider. Document Released: 07/08/2015 Document Revised: 02/29/2016 Document Reviewed: 04/12/2015 Elsevier Interactive Patient Education  [employer].

## 2017-09-13 LAB — CERVICOVAGINAL ANCILLARY ONLY
BACTERIAL VAGINITIS: NEGATIVE
CANDIDA VAGINITIS: POSITIVE — AB
Chlamydia: NEGATIVE
NEISSERIA GONORRHEA: NEGATIVE
TRICH (WINDOWPATH): NEGATIVE

## 2017-09-13 LAB — CYTOLOGY - PAP
Diagnosis: NEGATIVE
HPV: NOT DETECTED

## 2017-09-13 NOTE — Progress Notes (Signed)
GYNECOLOGY ANNUAL PREVENTATIVE CARE ENCOUNTER NOTE  Subjective:   Felicia Solis is a 52 y.o. [phone] female here for a routine annual gynecologic exam.  Current complaints: decreased libido which she attributes to perimenopausal state, does not want intervention.  Also has some vulvar irritation for a few days, wants to be checked for possible vaginitis.    Denies abnormal vaginal bleeding, discharge, pelvic pain, problems with intercourse or other gynecologic concerns.    Gynecologic History No LMP recorded. Patient has had an ablation. Contraception: tubal ligation Last Pap: 2016. Results were: normal Last mammogram: 05/15/2017. Results were: normal  Obstetric History OB History  Gravida Para Term Preterm AB Living  5 3 3   2 3   SAB TAB Ectopic Multiple Live Births  2            # Outcome Date GA Lbr Len/2nd Weight Sex Delivery Anes PTL Lv  5 SAB           4 SAB           3 Term           2 Term           1 Term             Past Medical History:  Diagnosis Date  . Anemia    anemia  when pregnant in 2002  . Anxiety   . Arthritis    "knees and back" (09/28/2016)  . Asthma 5 yrs ago    told she had asthma.. does not remember doctors name.   . Chronic lower back pain   . Depression    takes citalopram  . History of hiatal hernia   . History of kidney stones   . Hyperlipidemia   . Hypothyroidism   . LBBB (left bundle branch block)    rate-related; identified on stress test 03/21/16  . Migraine    "weekly" (09/28/2016)  . 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 Shortness of breath    with exertion  . Thyroid disease   . Type II diabetes mellitus ([place]) dx'd 2016    Past Surgical History:  Procedure Laterality Date  . DILATION AND CURETTAGE OF UTERUS  1999  . ENDOMETRIAL ABLATION    . LAPAROSCOPY     years ago ..pt does not know where surgery was done ... states" my stomach would blow up then go down"  .  THYROIDECTOMY  09/28/2011   Procedure: THYROIDECTOMY;  Surgeon: Izora Gala, MD;  Location: [place] [facility];  Service: ENT;  Laterality: Right;  RIGHT THYROIDECTOMY WITH FROZEN SECTION  . THYROIDECTOMY  09/28/2016   completion of thyroidectomy/notes 09/28/2016  . THYROIDECTOMY Left 09/28/2016   Procedure: COMPLETION OF THYROIDECTOMY;  Surgeon: Izora Gala, MD;  Location: [place];  Service: ENT;  Laterality: Left;  . TUBAL LIGATION      Current Outpatient Medications on File Prior to Visit  Medication Sig Dispense Refill  . acetaminophen (TYLENOL) 500 MG tablet Take 1,000 mg by mouth every 6 (six) hours as needed for mild pain.    Marland Kitchen aspirin EC 81 MG tablet Take 1 tablet (81 mg total) by mouth daily. 90 tablet 3  . Biotin 5000 MCG CAPS Take 5,000 mcg by mouth daily.     . Cholecalciferol (VITAMIN D3) 2000 units TABS Take 2,000 Units by mouth daily.     . citalopram (CELEXA) 20 MG tablet  Take 1 tablet (20 mg total) by mouth daily. 90 tablet 3  . cyclobenzaprine (FLEXERIL) 10 MG tablet Take 1 tablet (10 mg total) by mouth at bedtime. (Patient taking differently: Take 10 mg by mouth daily as needed for muscle spasms. ) 10 tablet 0  . diclofenac (VOLTAREN) 75 MG EC tablet   0  . Flaxseed, Linseed, (FLAXSEED OIL PO) Take 1,300 mg by mouth daily.    . fluticasone (FLONASE) 50 MCG/ACT nasal spray Place 2 sprays into both nostrils daily. (Patient taking differently: Place 2 sprays into both nostrils daily as needed for allergies. ) 16 g 1  . Ginkgo Biloba Extract 60 MG CAPS Take 60 mg by mouth daily.    Marland Kitchen glucose blood (ONETOUCH VERIO) test strip Onetouch Verio Check blood sugar twice daily 100 each 12  . HYDROcodone-acetaminophen (NORCO) 7.5-325 MG tablet Take 1 tablet by mouth every 6 (six) hours as needed for moderate pain. 20 tablet 0  . levothyroxine (SYNTHROID) 100 MCG tablet Take 1 tablet (100 mcg total) by mouth daily. 30 tablet 6  . losartan (COZAAR) 25 MG tablet Take 1 tablet (25 mg total) by mouth daily. 90  tablet 1  . methocarbamol (ROBAXIN) 500 MG tablet Take 1 tablet (500 mg total) by mouth 2 (two) times daily. 20 tablet 0  . Omega-3 Fatty Acids (FISH OIL) 1200 MG CAPS Take 1,200 mg by mouth daily.    Glory Rosebush DELICA LANCETS FINE MISC Check blood sugar twice daily 100 each 12  . simvastatin (ZOCOR) 40 MG tablet Take 1 tablet (40 mg total) by mouth at bedtime. 90 tablet 1  . SitaGLIPtin-MetFORMIN HCl (JANUMET XR) [id number] MG TB24 TAKE 1 TABLET BY MOUTH ONCE DAILY 90 tablet 1  . cyanocobalamin 500 MCG tablet Take 500 mcg by mouth daily.    . fluconazole (DIFLUCAN) 150 MG tablet Take 1 by mouth once repeat in 3 days if needed. 2 tablet 0  . ibuprofen (ADVIL,MOTRIN) 800 MG tablet TK 1 T PO  Q 6-8 H PRF PAIN  0  . pantoprazole (PROTONIX) 40 MG tablet Take 1 tablet (40 mg total) by mouth daily. 90 tablet 3  . Vitamin D, Ergocalciferol, (DRISDOL) 50000 units CAPS capsule  TAKE ONE CAPSULE BY MOUTH EVERY 7 DAYS 12 capsule 0   No current facility-administered medications on file prior to visit.     Allergies  Allergen Reactions  . Lisinopril Cough    Social History   Socioeconomic History  . Marital status: Married    Spouse name: Not on file  . Number of children: 3  . Years of education: Not on file  . Highest education level: Not on file  Occupational History  . Occupation: CNA  Social Needs  . Financial resource strain: Not on file  . Food insecurity:    Worry: Not on file    Inability: Not on file  . Transportation needs:    Medical: Not on file    Non-medical: Not on file  Tobacco Use  . Smoking status: Never Smoker  . Smokeless tobacco: Never Used  Substance and Sexual Activity  . Alcohol use: No    Alcohol/week: 0.0 oz  . Drug use: No  . Sexual activity: Yes    Partners: Male    Birth control/protection: Surgical  Lifestyle  . Physical activity:    Days per week: Not on file    Minutes per session: Not on file  . Stress: Not on file  Relationships  . Social  connections:    Talks on phone: Not on file    Gets together: Not on file    Attends religious service: Not on file    Active member of club or organization: Not on file    Attends meetings of clubs or organizations: Not on file    Relationship status: Not on file  . Intimate partner violence:    Fear of current or ex partner: Not on file    Emotionally abused: Not on file    Physically abused: Not on file    Forced sexual activity: Not on file  Other Topics Concern  . Not on file  Social History Narrative   No exercise--  Walking and lifting pts at work    Family History  Problem Relation Age of Onset  . Diabetes Mother   . Hypertension Mother   . Hyperlipidemia Mother   . Gallbladder disease Mother   . Heart disease Mother   . Diabetes Sister   . Hypertension Father   . Hyperlipidemia Father   . Colon polyps Father   . Ovarian cancer Maternal Grandmother   . Gallbladder disease Maternal Grandmother   . Colon cancer Paternal Grandfather   . Breast cancer Maternal Aunt   . Heart disease Other        early cad  . Diabetes Brother   . Heart attack Brother   . Anesthesia problems Neg Hx   . Hypotension Neg Hx   . Malignant hyperthermia Neg Hx   . Pseudochol deficiency Neg Hx   . Esophageal cancer Neg Hx     The following portions of the patient's history were reviewed and updated as appropriate: allergies, current medications, past family history, past medical history, past social history, past surgical history and problem list.  Review of Systems Pertinent items noted in HPI and remainder of comprehensive ROS otherwise negative.   Objective:  BP 106/71   Pulse 76   Ht 5' 6.5" (1.689 m)   Wt 230 lb (104.3 kg)   BMI 36.57 kg/m  CONSTITUTIONAL: Well-developed, well-nourished female in no acute distress.  HENT:  Normocephalic, atraumatic, External right and left ear normal. Oropharynx is clear and moist EYES: Conjunctivae and EOM are normal. Pupils are equal,  round, and reactive to light. No scleral icterus.  NECK: Normal range of motion, supple, no masses.  Normal thyroid.  SKIN: Skin is warm and dry. No rash noted. Not diaphoretic. No erythema. No pallor. NEUROLOGIC: Alert and oriented to person, place, and time. Normal reflexes, muscle tone coordination. No cranial nerve deficit noted. PSYCHIATRIC: Normal mood and affect. Normal behavior. Normal judgment and thought content. CARDIOVASCULAR: Normal heart rate noted, regular rhythm RESPIRATORY: Clear to auscultation bilaterally. Effort and breath sounds normal, no problems with respiration noted. BREASTS: Symmetric in size. No masses, skin changes, nipple drainage, or lymphadenopathy. ABDOMEN: Soft, normal bowel sounds, no distention noted.  No tenderness, rebound or guarding.  PELVIC: Normal appearing external genitalia; normal appearing vaginal mucosa and cervix with mild atrophy.  Scant clear discharge noted.  Pap smear obtained.  Normal uterine size, no other palpable masses, no uterine or adnexal tenderness. MUSCULOSKELETAL: Normal range of motion. No tenderness.  No cyanosis, clubbing, or edema.  2+ distal pulses.   Assessment and Plan:  1. Encounter for gynecological examination with Papanicolaou smear of cervix - Cytology - PAP Will follow up results of pap smear and manage accordingly.  2. Vulvar irritation - Cervicovaginal ancillary only done, will follow up results and manage accordingly.  Mammogram  Is up to date. Routine preventative health maintenance measures emphasized. Please refer to After Visit Summary for other counseling recommendations.    Verita Schneiders, MD, [place] for [employer] [facility] [employer], [place]

## 2017-09-16 ENCOUNTER — Telehealth: Payer: Self-pay

## 2017-09-16 MED ORDER — FLUCONAZOLE 150 MG PO TABS: ORAL_TABLET | ORAL | 2 refills | Status: DC

## 2017-09-16 NOTE — Telephone Encounter (Signed)
Patient returned call and made aware that diflucan has been sent in for her yeast infection. Also made aware that pap smear was normal . Patient states understanding. Kathrene Alu RNBSN

## 2017-09-16 NOTE — Telephone Encounter (Signed)
Left message for patient to return call to the office. Kathrene Alu RNBSN

## 2017-09-16 NOTE — Addendum Note (Signed)
Addended by: Verita Schneiders A on: 09/16/2017 08:34 AM   Modules accepted: Orders

## 2017-09-16 NOTE — Telephone Encounter (Signed)
-----   Message from Osborne Oman, MD sent at 09/16/2017  8:34 AM EDT ----- Vaginal discharge test is abnormal and showed yeast infection. Diflucan was prescribed. She had a normal pap smear. Please inform patient of results and advise to pick up prescription.

## 2017-09-17 ENCOUNTER — Ambulatory Visit: Payer: BLUE CROSS/BLUE SHIELD | Admitting: Family Medicine

## 2017-09-17 ENCOUNTER — Encounter: Payer: Self-pay | Admitting: Family Medicine

## 2017-09-17 ENCOUNTER — Ambulatory Visit (INDEPENDENT_AMBULATORY_CARE_PROVIDER_SITE_OTHER): Payer: BLUE CROSS/BLUE SHIELD | Admitting: Family Medicine

## 2017-09-17 VITALS — BP 118/78 | HR 72 | Temp 97.9°F | Resp 16 | Ht 66.5 in | Wt 232.0 lb

## 2017-09-17 DIAGNOSIS — E039 Hypothyroidism, unspecified: Secondary | ICD-10-CM

## 2017-09-17 DIAGNOSIS — IMO0002 Reserved for concepts with insufficient information to code with codable children: Secondary | ICD-10-CM

## 2017-09-17 DIAGNOSIS — E785 Hyperlipidemia, unspecified: Secondary | ICD-10-CM | POA: Diagnosis not present

## 2017-09-17 DIAGNOSIS — E559 Vitamin D deficiency, unspecified: Secondary | ICD-10-CM

## 2017-09-17 DIAGNOSIS — E1165 Type 2 diabetes mellitus with hyperglycemia: Secondary | ICD-10-CM

## 2017-09-17 DIAGNOSIS — I10 Essential (primary) hypertension: Secondary | ICD-10-CM

## 2017-09-17 DIAGNOSIS — E1151 Type 2 diabetes mellitus with diabetic peripheral angiopathy without gangrene: Secondary | ICD-10-CM

## 2017-09-17 MED ORDER — BLOOD GLUCOSE MONITOR KIT: PACK | 0 refills | Status: AC

## 2017-09-17 NOTE — Progress Notes (Signed)
Patient ID: Felicia Solis, female   DOB: 07/02/1965, 52 y.o.   MRN: [id number]    Subjective:  I acted as a Education administrator for Dr. Carollee Herter.  Felicia Solis, Felicia Solis   Patient ID: Felicia Solis, female    DOB: [birth year], 52 y.o.   MRN: [id number]  Chief Complaint  Patient presents with  . concerns about CT scan results    HPI  Patient is in today to discuss CT scan from ER.  Gyn not concerned about the fibroids.  No one explained the diverticulosis. ----pt reassured she does not have diverticulitis--- pain has improved significantly  Patient Care Team: Carollee Herter, Alferd Apa, DO as PCP - General (Family Medicine) Karl Luke, MD as Referring Physician (Optometry) Crawford Givens, MD as Consulting Physician (Obstetrics and Gynecology) Izora Gala, MD as Consulting Physician (Otolaryngology)   Past Medical History:  Diagnosis Date  . Anemia    anemia  when pregnant in 2002  . Anxiety   . Arthritis    "knees and back" (09/28/2016)  . Asthma 5 yrs ago    told she had asthma.. does not remember doctors name.   . Chronic lower back pain   . Depression    takes citalopram  . History of hiatal hernia   . History of kidney stones   . Hyperlipidemia   . Hypothyroidism   . LBBB (left bundle branch block)    rate-related; identified on stress test 03/21/16  . Migraine    "weekly" (09/28/2016)  . 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 Shortness of breath    with exertion  . Thyroid disease   . Type II diabetes mellitus ([place]) dx'd 2016    Past Surgical History:  Procedure Laterality Date  . DILATION AND CURETTAGE OF UTERUS  1999  . ENDOMETRIAL ABLATION    . LAPAROSCOPY     years ago ..pt does not know where surgery was done ... states" my stomach would blow up then go down"  . THYROIDECTOMY  09/28/2011   Procedure: THYROIDECTOMY;  Surgeon: Izora Gala, MD;  Location: [facility] [facility];  Service: ENT;  Laterality: Right;  RIGHT THYROIDECTOMY WITH FROZEN  SECTION  . THYROIDECTOMY  09/28/2016   completion of thyroidectomy/notes 09/28/2016  . THYROIDECTOMY Left 09/28/2016   Procedure: COMPLETION OF THYROIDECTOMY;  Surgeon: Izora Gala, MD;  Location: [facility];  Service: ENT;  Laterality: Left;  . TUBAL LIGATION      Family History  Problem Relation Age of Onset  . Diabetes Mother   . Hypertension Mother   . Hyperlipidemia Mother   . Gallbladder disease Mother   . Heart disease Mother   . Diabetes Sister   . Hypertension Father   . Hyperlipidemia Father   . Colon polyps Father   . Ovarian cancer Maternal Grandmother   . Gallbladder disease Maternal Grandmother   . Colon cancer Paternal Grandfather   . Breast cancer Maternal Aunt   . Heart disease Other        early cad  . Diabetes Brother   . Heart attack Brother   . Anesthesia problems Neg Hx   . Hypotension Neg Hx   . Malignant hyperthermia Neg Hx   . Pseudochol deficiency Neg Hx   . Esophageal cancer Neg Hx     Social History   Socioeconomic History  . Marital status: Married    Spouse name: Not on file  . Number of children:  3  . Years of education: Not on file  . Highest education level: Not on file  Occupational History  . Occupation: CNA  Social Needs  . Financial resource strain: Not on file  . Food insecurity:    Worry: Not on file    Inability: Not on file  . Transportation needs:    Medical: Not on file    Non-medical: Not on file  Tobacco Use  . Smoking status: Never Smoker  . Smokeless tobacco: Never Used  Substance and Sexual Activity  . Alcohol use: No    Alcohol/week: 0.0 oz  . Drug use: No  . Sexual activity: Yes    Partners: Male    Birth control/protection: Surgical  Lifestyle  . Physical activity:    Days per week: Not on file    Minutes per session: Not on file  . Stress: Not on file  Relationships  . Social connections:    Talks on phone: Not on file    Gets together: Not on file    Attends religious service: Not on file    Active  member of club or organization: Not on file    Attends meetings of clubs or organizations: Not on file    Relationship status: Not on file  . Intimate partner violence:    Fear of current or ex partner: Not on file    Emotionally abused: Not on file    Physically abused: Not on file    Forced sexual activity: Not on file  Other Topics Concern  . Not on file  Social History Narrative   No exercise--  Walking and lifting pts at work    Outpatient Medications Prior to Visit  Medication Sig Dispense Refill  . acetaminophen (TYLENOL) 500 MG tablet Take 1,000 mg by mouth every 6 (six) hours as needed for mild pain.    Marland Kitchen aspirin EC 81 MG tablet Take 1 tablet (81 mg total) by mouth daily. 90 tablet 3  . Cholecalciferol (VITAMIN D3) 2000 units TABS Take 2,000 Units by mouth daily.     . citalopram (CELEXA) 20 MG tablet Take 1 tablet (20 mg total) by mouth daily. 90 tablet 3  . cyanocobalamin 500 MCG tablet Take 500 mcg by mouth daily.    . cyclobenzaprine (FLEXERIL) 10 MG tablet Take 1 tablet (10 mg total) by mouth at bedtime. (Patient taking differently: Take 10 mg by mouth daily as needed for muscle spasms. ) 10 tablet 0  . diclofenac (VOLTAREN) 75 MG EC tablet   0  . Flaxseed, Linseed, (FLAXSEED OIL PO) Take 1,300 mg by mouth daily.    . fluconazole (DIFLUCAN) 150 MG tablet Take 1 by mouth once; may repeat in 3 days if needed. 2 tablet 2  . fluticasone (FLONASE) 50 MCG/ACT nasal spray Place 2 sprays into both nostrils daily. (Patient taking differently: Place 2 sprays into both nostrils daily as needed for allergies. ) 16 g 1  . Ginkgo Biloba Extract 60 MG CAPS Take 60 mg by mouth daily.    Marland Kitchen glucose blood (ONETOUCH VERIO) test strip Onetouch Verio Check blood sugar twice daily 100 each 12  . ibuprofen (ADVIL,MOTRIN) 800 MG tablet TK 1 T PO  Q 6-8 H PRF PAIN  0  . levothyroxine (SYNTHROID) 100 MCG tablet Take 1 tablet (100 mcg total) by mouth daily. 30 tablet 6  . losartan (COZAAR) 25 MG  tablet Take 1 tablet (25 mg total) by mouth daily. 90 tablet 1  . methocarbamol (  ROBAXIN) 500 MG tablet Take 1 tablet (500 mg total) by mouth 2 (two) times daily. 20 tablet 0  . Omega-3 Fatty Acids (FISH OIL) 1200 MG CAPS Take 1,200 mg by mouth daily.    Glory Rosebush DELICA LANCETS FINE MISC Check blood sugar twice daily 100 each 12  . pantoprazole (PROTONIX) 40 MG tablet Take 1 tablet (40 mg total) by mouth daily. 90 tablet 3  . simvastatin (ZOCOR) 40 MG tablet Take 1 tablet (40 mg total) by mouth at bedtime. 90 tablet 1  . SitaGLIPtin-MetFORMIN HCl (JANUMET XR) [phone] MG TB24 TAKE 1 TABLET BY MOUTH ONCE DAILY 90 tablet 1  . Vitamin D, Ergocalciferol, (DRISDOL) 50000 units CAPS capsule  TAKE ONE CAPSULE BY MOUTH EVERY 7 DAYS 12 capsule 0  . Biotin 5000 MCG CAPS Take 5,000 mcg by mouth daily.     Marland Kitchen HYDROcodone-acetaminophen (NORCO) 7.5-325 MG tablet Take 1 tablet by mouth every 6 (six) hours as needed for moderate pain. 20 tablet 0   No facility-administered medications prior to visit.     Allergies  Allergen Reactions  . Lisinopril Cough    Review of Systems  Constitutional: Negative for fever and malaise/fatigue.  HENT: Negative for congestion.   Eyes: Negative for blurred vision.  Respiratory: Negative for cough and shortness of breath.   Cardiovascular: Negative for chest pain, palpitations and leg swelling.  Gastrointestinal: Negative for vomiting.  Musculoskeletal: Negative for back pain.  Skin: Negative for rash.  Neurological: Negative for loss of consciousness and headaches.       Objective:    Physical Exam  Constitutional: She is oriented to person, place, and time. She appears well-developed and well-nourished.  HENT:  Head: Normocephalic and atraumatic.  Eyes: Conjunctivae and EOM are normal.  Neck: Normal range of motion. Neck supple. No JVD present. Carotid bruit is not present. No thyromegaly present.  Cardiovascular: Normal rate, regular rhythm and normal  heart sounds.  No murmur heard. Pulmonary/Chest: Effort normal and breath sounds normal. No respiratory distress. She has no wheezes. She has no rales. She exhibits no tenderness.  Musculoskeletal: She exhibits no edema.  Neurological: She is alert and oriented to person, place, and time.  Psychiatric: She has a normal mood and affect.  Nursing note and vitals reviewed.   BP 118/78 (BP Location: Left Arm, Cuff Size: Normal)   Pulse 72   Temp 97.9 F (36.6 C) (Oral)   Resp 16   Ht 5' 6.5" (1.689 m)   Wt 232 lb (105.2 kg)   SpO2 98%   BMI 36.88 kg/m  Wt Readings from Last 3 Encounters:  09/17/17 232 lb (105.2 kg)  09/11/17 230 lb (104.3 kg)  08/05/17 237 lb (107.5 kg)   BP Readings from Last 3 Encounters:  09/17/17 118/78  09/11/17 106/71  08/06/17 121/68     Immunization History  Administered Date(s) Administered  . Zoster Recombinat (Shingrix) 04/04/2017, 06/07/2017    Health Maintenance  Topic Date Due  . PNEUMOCOCCAL POLYSACCHARIDE VACCINE (1) 10/12/1966  . TETANUS/TDAP  08/27/2015  . INFLUENZA VACCINE  01/23/2017  . OPHTHALMOLOGY EXAM  09/18/2017  . HEMOGLOBIN A1C  10/03/2017  . MAMMOGRAM  05/15/2018  . FOOT EXAM  09/18/2018  . PAP SMEAR  09/11/2020  . COLONOSCOPY  12/02/2024  . HIV Screening  Completed    Lab Results  Component Value Date   WBC 10.1 08/05/2017   HGB 12.3 08/05/2017   HCT 36.7 08/05/2017   PLT 292 08/05/2017   GLUCOSE 148 (  H) 08/05/2017   CHOL 160 04/04/2017   TRIG 103.0 04/04/2017   HDL 38.90 (L) 04/04/2017   LDLCALC 100 (H) 04/04/2017   ALT 27 08/05/2017   AST 21 08/05/2017   NA 138 08/05/2017   K 3.6 08/05/2017   CL 105 08/05/2017   CREATININE 0.45 08/05/2017   BUN 10 08/05/2017   CO2 26 08/05/2017   TSH 1.06 08/15/2015   HGBA1C 6.9 (H) 04/04/2017   MICROALBUR 2.5 (H) 04/04/2017    Lab Results  Component Value Date   TSH 1.06 08/15/2015   Lab Results  Component Value Date   WBC 10.1 08/05/2017   HGB 12.3 08/05/2017     HCT 36.7 08/05/2017   MCV 85.9 08/05/2017   PLT 292 08/05/2017   Lab Results  Component Value Date   NA 138 08/05/2017   K 3.6 08/05/2017   CO2 26 08/05/2017   GLUCOSE 148 (H) 08/05/2017   BUN 10 08/05/2017   CREATININE 0.45 08/05/2017   BILITOT 0.1 (L) 08/05/2017   ALKPHOS 49 08/05/2017   AST 21 08/05/2017   ALT 27 08/05/2017   PROT 7.0 08/05/2017   ALBUMIN 3.8 08/05/2017   CALCIUM 8.9 08/05/2017   ANIONGAP 7 08/05/2017   GFR 162.56 04/04/2017   Lab Results  Component Value Date   CHOL 160 04/04/2017   Lab Results  Component Value Date   HDL 38.90 (L) 04/04/2017   Lab Results  Component Value Date   LDLCALC 100 (H) 04/04/2017   Lab Results  Component Value Date   TRIG 103.0 04/04/2017   Lab Results  Component Value Date   CHOLHDL 4 04/04/2017   Lab Results  Component Value Date   HGBA1C 6.9 (H) 04/04/2017         Assessment & Plan:   Problem List Items Addressed This Visit      Unprioritized   DM (diabetes mellitus) type II uncontrolled, periph vascular disorder ([place])    hgba1c to be checked, minimize simple carbs. Increase exercise as tolerated. Continue current meds       Relevant Medications   blood glucose meter kit and supplies KIT   Other Relevant Orders   Hemoglobin A1c   Essential hypertension    Well controlled, no changes to meds. Encouraged heart healthy diet such as the DASH diet and exercise as tolerated.       Hyperlipidemia LDL goal <100 - Primary   Relevant Orders   Lipid panel   Comprehensive metabolic panel   Hyperlipidemia LDL goal <70    Tolerating statin, encouraged heart healthy diet, avoid trans fats, minimize simple carbs and saturated fats. Increase exercise as tolerated      Hypothyroidism    Check labs       Relevant Orders   TSH   Vitamin D deficiency   Relevant Orders   Vitamin D (25 hydroxy)      I have discontinued Mariann Laster B. Fickle's Biotin and HYDROcodone-acetaminophen. I am also having her start  on blood glucose meter kit and supplies. Additionally, I am having her maintain her acetaminophen, ONETOUCH DELICA LANCETS FINE, cyanocobalamin, Ginkgo Biloba Extract, (Flaxseed, Linseed, (FLAXSEED OIL PO)), Fish Oil, Vitamin D3, aspirin EC, fluticasone, cyclobenzaprine, levothyroxine, methocarbamol, ibuprofen, citalopram, losartan, pantoprazole, simvastatin, SitaGLIPtin-MetFORMIN HCl, glucose blood, diclofenac, Vitamin D (Ergocalciferol), and fluconazole.  Meds ordered this encounter  Medications  . blood glucose meter kit and supplies KIT    Sig: Dispense based on patient and insurance preference. Use up to four times daily as  directed. (FOR ICD-9 250.00, 250.01).    Dispense:  1 each    Refill:  0    Order Specific Question:   Number of strips    Answer:   100    Order Specific Question:   Number of lancets    Answer:   100    CMA served as scribe during this visit. History, Physical and Plan performed by medical provider. Documentation and orders reviewed and attested to.  [name], DO

## 2017-09-17 NOTE — Patient Instructions (Signed)

## 2017-09-18 DIAGNOSIS — E559 Vitamin D deficiency, unspecified: Secondary | ICD-10-CM | POA: Insufficient documentation

## 2017-09-18 DIAGNOSIS — E039 Hypothyroidism, unspecified: Secondary | ICD-10-CM | POA: Insufficient documentation

## 2017-09-18 DIAGNOSIS — E785 Hyperlipidemia, unspecified: Secondary | ICD-10-CM | POA: Insufficient documentation

## 2017-09-18 LAB — COMPREHENSIVE METABOLIC PANEL
ALT: 25 U/L (ref 0–35)
AST: 21 U/L (ref 0–37)
Albumin: 4.2 g/dL (ref 3.5–5.2)
Alkaline Phosphatase: 42 U/L (ref 39–117)
BUN: 13 mg/dL (ref 6–23)
CHLORIDE: 103 meq/L (ref 96–112)
CO2: 29 meq/L (ref 19–32)
Calcium: 9.2 mg/dL (ref 8.4–10.5)
Creatinine, Ser: 0.53 mg/dL (ref 0.40–1.20)
GFR: 155.23 mL/min (ref >60.00)
Glucose, Bld: 101 mg/dL — ABNORMAL HIGH (ref 70–99)
Potassium: 3.9 mEq/L (ref 3.5–5.1)
SODIUM: 140 meq/L (ref 135–145)
Total Bilirubin: 0.4 mg/dL (ref 0.2–1.2)
Total Protein: 7.2 g/dL (ref 6.0–8.3)

## 2017-09-18 LAB — LIPID PANEL
CHOL/HDL RATIO: 4
Cholesterol: 156 mg/dL (ref 0–200)
HDL: 34.7 mg/dL — ABNORMAL LOW (ref >39.00)
LDL CALC: 93 mg/dL (ref 0–99)
NONHDL: 121.39
Triglycerides: 140 mg/dL (ref 0.0–149.0)
VLDL: 28 mg/dL (ref 0.0–40.0)

## 2017-09-18 LAB — VITAMIN D 25 HYDROXY (VIT D DEFICIENCY, FRACTURES): VITD: 33.39 ng/mL (ref 30.00–100.00)

## 2017-09-18 LAB — HEMOGLOBIN A1C: Hgb A1c MFr Bld: 6.9 % — ABNORMAL HIGH (ref 4.6–6.5)

## 2017-09-18 LAB — TSH: TSH: 0.65 u[IU]/mL (ref 0.35–4.50)

## 2017-09-18 NOTE — Assessment & Plan Note (Signed)
Well controlled, no changes to meds. Encouraged heart healthy diet such as the DASH diet and exercise as tolerated.  °

## 2017-09-18 NOTE — Assessment & Plan Note (Signed)
hgba1c to be checked, minimize simple carbs. Increase exercise as tolerated. Continue current meds  

## 2017-09-18 NOTE — Assessment & Plan Note (Signed)
Tolerating statin, encouraged heart healthy diet, avoid trans fats, minimize simple carbs and saturated fats. Increase exercise as tolerated 

## 2017-09-18 NOTE — Assessment & Plan Note (Signed)
Check labs 

## 2017-09-25 ENCOUNTER — Other Ambulatory Visit: Payer: Self-pay | Admitting: *Deleted

## 2017-09-25 DIAGNOSIS — I10 Essential (primary) hypertension: Secondary | ICD-10-CM

## 2017-09-25 DIAGNOSIS — E785 Hyperlipidemia, unspecified: Secondary | ICD-10-CM

## 2017-09-25 MED ORDER — ROSUVASTATIN CALCIUM 20 MG PO TABS
20.0000 mg | ORAL_TABLET | Freq: Every day | ORAL | 2 refills | Status: DC
Start: 2017-09-25 — End: 2017-09-27

## 2017-09-25 NOTE — Progress Notes (Signed)
c 

## 2017-09-26 ENCOUNTER — Telehealth: Payer: Self-pay | Admitting: Family Medicine

## 2017-09-26 NOTE — Telephone Encounter (Signed)
p pec Copied from [place] [phone]. Topic: Quick Communication - Lab Results >> Sep 26, 2017  3:56 PM Cecelia Byars, NT wrote: Patient called and would like a call with her lab results  [phone]

## 2017-09-26 NOTE — Telephone Encounter (Signed)
Left message to call back regarding lab results.

## 2017-09-27 ENCOUNTER — Ambulatory Visit (INDEPENDENT_AMBULATORY_CARE_PROVIDER_SITE_OTHER): Payer: BLUE CROSS/BLUE SHIELD | Admitting: Family Medicine

## 2017-09-27 ENCOUNTER — Encounter: Payer: Self-pay | Admitting: Family Medicine

## 2017-09-27 VITALS — BP 122/63 | HR 68 | Temp 98.8°F | Resp 16 | Ht 67.0 in | Wt 230.0 lb

## 2017-09-27 DIAGNOSIS — E1151 Type 2 diabetes mellitus with diabetic peripheral angiopathy without gangrene: Secondary | ICD-10-CM

## 2017-09-27 DIAGNOSIS — E1165 Type 2 diabetes mellitus with hyperglycemia: Secondary | ICD-10-CM | POA: Diagnosis not present

## 2017-09-27 DIAGNOSIS — Z0000 Encounter for general adult medical examination without abnormal findings: Secondary | ICD-10-CM | POA: Diagnosis not present

## 2017-09-27 DIAGNOSIS — I10 Essential (primary) hypertension: Secondary | ICD-10-CM

## 2017-09-27 DIAGNOSIS — Z23 Encounter for immunization: Secondary | ICD-10-CM | POA: Diagnosis not present

## 2017-09-27 DIAGNOSIS — K219 Gastro-esophageal reflux disease without esophagitis: Secondary | ICD-10-CM

## 2017-09-27 DIAGNOSIS — IMO0002 Reserved for concepts with insufficient information to code with codable children: Secondary | ICD-10-CM

## 2017-09-27 DIAGNOSIS — E785 Hyperlipidemia, unspecified: Secondary | ICD-10-CM

## 2017-09-27 DIAGNOSIS — E039 Hypothyroidism, unspecified: Secondary | ICD-10-CM

## 2017-09-27 MED ORDER — PANTOPRAZOLE SODIUM 40 MG PO TBEC: 40.0000 mg | DELAYED_RELEASE_TABLET | Freq: Every day | ORAL | 3 refills | Status: DC

## 2017-09-27 MED ORDER — SITAGLIP PHOS-METFORMIN HCL ER 100-1000 MG PO TB24: ORAL_TABLET | ORAL | 1 refills | Status: DC

## 2017-09-27 MED ORDER — SIMVASTATIN 40 MG PO TABS: 40.0000 mg | ORAL_TABLET | Freq: Every day | ORAL | 1 refills | Status: DC

## 2017-09-27 NOTE — Addendum Note (Signed)
Addended by: Hinton Dyer on: 09/27/2017 10:07 AM   Modules accepted: Orders

## 2017-09-27 NOTE — Assessment & Plan Note (Signed)
hgba1c acceptable minimize simple carbs. Increase exercise as tolerated. Continue current meds

## 2017-09-27 NOTE — Assessment & Plan Note (Signed)
Well controlled, no changes to meds. Encouraged heart healthy diet such as the DASH diet and exercise as tolerated.  °

## 2017-09-27 NOTE — Assessment & Plan Note (Signed)
Tolerating statin, encouraged heart healthy diet, avoid trans fats, minimize simple carbs and saturated fats. Increase exercise as tolerated 

## 2017-09-27 NOTE — Progress Notes (Addendum)
Subjective:     Felicia Solis is a 52 y.o. female and is here for a comprehensive physical exam. The patient reports problems - elevated blood sugars ,  struggling with diet and exercise. .  Social History   Socioeconomic History  . Marital status: Married    Spouse name: Not on file  . Number of children: 3  . Years of education: Not on file  . Highest education level: Not on file  Occupational History  . Occupation: CNA  Social Needs  . Financial resource strain: Not on file  . Food insecurity:    Worry: Not on file    Inability: Not on file  . Transportation needs:    Medical: Not on file    Non-medical: Not on file  Tobacco Use  . Smoking status: Never Smoker  . Smokeless tobacco: Never Used  Substance and Sexual Activity  . Alcohol use: No    Alcohol/week: 0.0 oz  . Drug use: No  . Sexual activity: Yes    Partners: Male    Birth control/protection: Surgical  Lifestyle  . Physical activity:    Days per week: Not on file    Minutes per session: Not on file  . Stress: Not on file  Relationships  . Social connections:    Talks on phone: Not on file    Gets together: Not on file    Attends religious service: Not on file    Active member of club or organization: Not on file    Attends meetings of clubs or organizations: Not on file    Relationship status: Not on file  . Intimate partner violence:    Fear of current or ex partner: Not on file    Emotionally abused: Not on file    Physically abused: Not on file    Forced sexual activity: Not on file  Other Topics Concern  . Not on file  Social History Narrative   No exercise--  Walking and lifting pts at work   Health Maintenance  Topic Date Due  . PNEUMOCOCCAL POLYSACCHARIDE VACCINE (1) 10/12/1966  . TETANUS/TDAP  08/27/2015  . OPHTHALMOLOGY EXAM  09/18/2017  . INFLUENZA VACCINE  01/23/2018  . HEMOGLOBIN A1C  03/20/2018  . MAMMOGRAM  05/15/2018  . FOOT EXAM  09/18/2018  . PAP SMEAR  09/11/2020  .  COLONOSCOPY  12/02/2024  . HIV Screening  Completed    The following portions of the patient's history were reviewed and updated as appropriate:  She  has a past medical history of Anemia, Anxiety, Arthritis, Asthma (5 yrs ago ), Chronic lower back pain, Depression, History of hiatal hernia, History of kidney stones, Hyperlipidemia, Hypothyroidism, LBBB (left bundle branch block), Migraine, Positive TB test (2008), Shortness of breath, Thyroid disease, and Type II diabetes mellitus ([place]) (dx'd 2016). She does not have any pertinent problems on file. She  has a past surgical history that includes Endometrial ablation; Tubal ligation; laparoscopy; Dilation and curettage of uterus (1999); Thyroidectomy (09/28/2011); Thyroidectomy (09/28/2016); and Thyroidectomy (Left, 09/28/2016). Her family history includes Breast cancer in her maternal aunt; Colon cancer in her paternal grandfather; Colon polyps in her father; Diabetes in her brother, mother, and sister; Gallbladder disease in her maternal grandmother and mother; Heart attack in her brother; Heart disease in her mother and other; Hyperlipidemia in her father and mother; Hypertension in her father and mother; Ovarian cancer in her maternal grandmother. She  reports that she has never smoked. She has never used smokeless tobacco.  She reports that she does not drink alcohol or use drugs. She has a current medication list which includes the following prescription(s): acetaminophen, aspirin ec, blood glucose meter kit and supplies, vitamin d3, citalopram, cyanocobalamin, cyclobenzaprine, diclofenac, flaxseed (linseed), fluconazole, fluticasone, ginkgo biloba extract, glucose blood, ibuprofen, levothyroxine, losartan, methocarbamol, fish oil, onetouch delica lancets fine, pantoprazole, simvastatin, and sitagliptin-metformin hcl. Current Outpatient Medications on File Prior to Visit  Medication Sig Dispense Refill  . acetaminophen (TYLENOL) 500 MG tablet Take 1,000  mg by mouth every 6 (six) hours as needed for mild pain.    Marland Kitchen aspirin EC 81 MG tablet Take 1 tablet (81 mg total) by mouth daily. 90 tablet 3  . blood glucose meter kit and supplies KIT Dispense based on patient and insurance preference. Use up to four times daily as directed. (FOR ICD-9 250.00, 250.01). 1 each 0  . Cholecalciferol (VITAMIN D3) 2000 units TABS Take 2,000 Units by mouth daily.     . citalopram (CELEXA) 20 MG tablet Take 1 tablet (20 mg total) by mouth daily. 90 tablet 3  . cyanocobalamin 500 MCG tablet Take 500 mcg by mouth daily.    . cyclobenzaprine (FLEXERIL) 10 MG tablet Take 1 tablet (10 mg total) by mouth at bedtime. (Patient taking differently: Take 10 mg by mouth daily as needed for muscle spasms. ) 10 tablet 0  . diclofenac (VOLTAREN) 75 MG EC tablet   0  . Flaxseed, Linseed, (FLAXSEED OIL PO) Take 1,300 mg by mouth daily.    . fluconazole (DIFLUCAN) 150 MG tablet Take 1 by mouth once; may repeat in 3 days if needed. 2 tablet 2  . fluticasone (FLONASE) 50 MCG/ACT nasal spray Place 2 sprays into both nostrils daily. (Patient taking differently: Place 2 sprays into both nostrils daily as needed for allergies. ) 16 g 1  . Ginkgo Biloba Extract 60 MG CAPS Take 60 mg by mouth daily.    Marland Kitchen glucose blood (ONETOUCH VERIO) test strip Onetouch Verio Check blood sugar twice daily 100 each 12  . ibuprofen (ADVIL,MOTRIN) 800 MG tablet TK 1 T PO  Q 6-8 H PRF PAIN  0  . levothyroxine (SYNTHROID) 100 MCG tablet Take 1 tablet (100 mcg total) by mouth daily. 30 tablet 6  . losartan (COZAAR) 25 MG tablet Take 1 tablet (25 mg total) by mouth daily. 90 tablet 1  . methocarbamol (ROBAXIN) 500 MG tablet Take 1 tablet (500 mg total) by mouth 2 (two) times daily. 20 tablet 0  . Omega-3 Fatty Acids (FISH OIL) 1200 MG CAPS Take 1,200 mg by mouth daily.    Glory Rosebush DELICA LANCETS FINE MISC Check blood sugar twice daily 100 each 12   No current facility-administered medications on file prior to  visit.    She is allergic to lisinopril..  Review of Systems Review of Systems  Constitutional: Negative for activity change, appetite change and fatigue.  HENT: Negative for hearing loss, congestion, tinnitus and ear discharge.  dentist q4mEyes: Negative for visual disturbance (see optho q1y -- vision corrected to 20/20 with glasses).  Respiratory: Negative for cough, chest tightness and shortness of breath.    Cardiovascular: Negative for chest pain, palpitations and leg swelling.  Gastrointestinal: Negative for abdominal pain, diarrhea, constipation and abdominal distention.  Genitourinary: Negative for urgency, frequency, decreased urine volume and difficulty urinating.  Musculoskeletal: Negative for back pain, arthralgias and gait problem.  Skin: Negative for color change, pallor and rash.  Neurological: Negative for dizziness, light-headedness, numbness and headaches.  Hematological: Negative for adenopathy. Does not bruise/bleed easily.  Psychiatric/Behavioral: Negative for suicidal ideas, confusion, sleep disturbance, self-injury, dysphoric mood, decreased concentration and agitation.       Objective:    BP 122/63   Pulse 68   Temp 98.8 F (37.1 C) (Oral)   Resp 16   Ht _0  (1.702 m)   Wt 230 lb (104.3 kg)   SpO2 100%   BMI 36.02 kg/m  General appearance: alert, cooperative, appears stated age and no distress Head: Normocephalic, without obvious abnormality, atraumatic Eyes: negative findings: lids and lashes normal and pupils equal, round, reactive to light and accomodation Ears: normal TM's and external ear canals both ears Nose: Nares normal. Septum midline. Mucosa normal. No drainage or sinus tenderness. Throat: lips, mucosa, and tongue normal; teeth and gums normal Neck: no adenopathy, no carotid bruit, no JVD, supple, symmetrical, trachea midline and thyroid not enlarged, symmetric, no tenderness/mass/nodules Back: symmetric, no curvature. ROM normal. No  CVA tenderness. Lungs: clear to auscultation bilaterally Breasts: gyn Heart: regular rate and rhythm, S1, S2 normal, no murmur, click, rub or gallop Abdomen: soft, non-tender; bowel sounds normal; no masses,  no organomegaly Pelvic: deferred Extremities: extremities normal, atraumatic, no cyanosis or edema Pulses: 2+ and symmetric Skin: Skin color, texture, turgor normal. No rashes or lesions Lymph nodes: Cervical, supraclavicular, and axillary nodes normal. Neurologic: Alert and oriented X 3, normal strength and tone. Normal symmetric reflexes. Normal coordination and gait    Diabetic Foot Exam - Simple   Simple Foot Form Diabetic Foot exam was performed with the following findings:  Yes 09/27/2017  9:45 AM  Visual Inspection No deformities, no ulcerations, no other skin breakdown bilaterally:  Yes Sensation Testing Intact to touch and monofilament testing bilaterally:  Yes Pulse Check Posterior Tibialis and Dorsalis pulse intact bilaterally:  Yes Comments     Assessment:    Healthy female exam.      Plan:     ghm utd Check labs See After Visit Summary for Counseling Recommendations   dtap given  1. Preventative health care See above Labs reviewed  2. DM (diabetes mellitus) type II uncontrolled, periph vascular disorder (HCC) hgba1c acceptable, minimize simple carbs. Increase exercise as tolerated. Continue current meds  - SitaGLIPtin-MetFORMIN HCl (JANUMET XR) [phone] MG TB24; TAKE 1 TABLET BY MOUTH ONCE DAILY  Dispense: 90 tablet; Refill: 1  3. Hyperlipidemia, unspecified hyperlipidemia type Encouraged heart healthy diet, increase exercise, avoid trans fats, consider a krill oil cap daily - simvastatin (ZOCOR) 40 MG tablet; Take 1 tablet (40 mg total) by mouth at bedtime.  Dispense: 90 tablet; Refill: 1  4. Gastroesophageal reflux disease, esophagitis presence not specified sstable - pantoprazole (PROTONIX) 40 MG tablet; Take 1 tablet (40 mg total) by mouth daily.   Dispense: 90 tablet; Refill: 3  5. Hyperlipidemia LDL goal <70   6. Essential hypertension Well controlled, no changes to meds. Encouraged heart healthy diet such as the DASH diet and exercise as tolerated.   7. Hypothyroidism, unspecified type Stable Check labs  [name], DO

## 2017-09-27 NOTE — Assessment & Plan Note (Signed)
Check labs 

## 2017-09-27 NOTE — Patient Instructions (Signed)
Preventive Care 40-64 Years, Female Preventive care refers to lifestyle choices and visits with your health care provider that can promote health and wellness. What does preventive care include?  A yearly physical exam. This is also called an annual well check.  Dental exams once or twice a year.  Routine eye exams. Ask your health care provider how often you should have your eyes checked.  Personal lifestyle choices, including: ? Daily care of your teeth and gums. ? Regular physical activity. ? Eating a healthy diet. ? Avoiding tobacco and drug use. ? Limiting alcohol use. ? Practicing safe sex. ? Taking low-dose aspirin daily starting at age 58. ? Taking vitamin and mineral supplements as recommended by your health care provider. What happens during an annual well check? The services and screenings done by your health care provider during your annual well check will depend on your age, overall health, lifestyle risk factors, and family history of disease. Counseling Your health care provider may ask you questions about your:  Alcohol use.  Tobacco use.  Drug use.  Emotional well-being.  Home and relationship well-being.  Sexual activity.  Eating habits.  Work and work Statistician.  Method of birth control.  Menstrual cycle.  Pregnancy history.  Screening You may have the following tests or measurements:  Height, weight, and BMI.  Blood pressure.  Lipid and cholesterol levels. These may be checked every 5 years, or more frequently if you are over 81 years old.  Skin check.  Lung cancer screening. You may have this screening every year starting at age 78 if you have a 30-pack-year history of smoking and currently smoke or have quit within the past 15 years.  Fecal occult blood test (FOBT) of the stool. You may have this test every year starting at age 65.  Flexible sigmoidoscopy or colonoscopy. You may have a sigmoidoscopy every 5 years or a colonoscopy  every 10 years starting at age 30.  Hepatitis C blood test.  Hepatitis B blood test.  Sexually transmitted disease (STD) testing.  Diabetes screening. This is done by checking your blood sugar (glucose) after you have not eaten for a while (fasting). You may have this done every 1-3 years.  Mammogram. This may be done every 1-2 years. Talk to your health care provider about when you should start having regular mammograms. This may depend on whether you have a family history of breast cancer.  BRCA-related cancer screening. This may be done if you have a family history of breast, ovarian, tubal, or peritoneal cancers.  Pelvic exam and Pap test. This may be done every 3 years starting at age 80. Starting at age 36, this may be done every 5 years if you have a Pap test in combination with an HPV test.  Bone density scan. This is done to screen for osteoporosis. You may have this scan if you are at high risk for osteoporosis.  Discuss your test results, treatment options, and if necessary, the need for more tests with your health care provider. Vaccines Your health care provider may recommend certain vaccines, such as:  Influenza vaccine. This is recommended every year.  Tetanus, diphtheria, and acellular pertussis (Tdap, Td) vaccine. You may need a Td booster every 10 years.  Varicella vaccine. You may need this if you have not been vaccinated.  Zoster vaccine. You may need this after age 5.  Measles, mumps, and rubella (MMR) vaccine. You may need at least one dose of MMR if you were born in  1958 or later. You may also need a second dose.  Pneumococcal 13-valent conjugate (PCV13) vaccine. You may need this if you have certain conditions and were not previously vaccinated.  Pneumococcal polysaccharide (PPSV23) vaccine. You may need one or two doses if you smoke cigarettes or if you have certain conditions.  Meningococcal vaccine. You may need this if you have certain  conditions.  Hepatitis A vaccine. You may need this if you have certain conditions or if you travel or work in places where you may be exposed to hepatitis A.  Hepatitis B vaccine. You may need this if you have certain conditions or if you travel or work in places where you may be exposed to hepatitis B.  Haemophilus influenzae type b (Hib) vaccine. You may need this if you have certain conditions.  Talk to your health care provider about which screenings and vaccines you need and how often you need them. This information is not intended to replace advice given to you by your health care provider. Make sure you discuss any questions you have with your health care provider. Document Released: 07/08/2015 Document Revised: 02/29/2016 Document Reviewed: 04/12/2015 [employer] Interactive Patient Education  2018 [employer]  

## 2017-10-03 ENCOUNTER — Ambulatory Visit: Payer: Self-pay | Admitting: Family Medicine

## 2017-10-03 ENCOUNTER — Encounter (INDEPENDENT_AMBULATORY_CARE_PROVIDER_SITE_OTHER): Payer: Self-pay

## 2017-10-17 ENCOUNTER — Encounter (INDEPENDENT_AMBULATORY_CARE_PROVIDER_SITE_OTHER): Payer: Self-pay

## 2017-10-22 ENCOUNTER — Ambulatory Visit (INDEPENDENT_AMBULATORY_CARE_PROVIDER_SITE_OTHER): Payer: Self-pay | Admitting: Family Medicine

## 2017-10-22 ENCOUNTER — Encounter (INDEPENDENT_AMBULATORY_CARE_PROVIDER_SITE_OTHER): Payer: Self-pay

## 2017-11-19 ENCOUNTER — Ambulatory Visit (INDEPENDENT_AMBULATORY_CARE_PROVIDER_SITE_OTHER): Payer: BLUE CROSS/BLUE SHIELD | Admitting: Family Medicine

## 2017-11-19 ENCOUNTER — Encounter: Payer: Self-pay | Admitting: Family Medicine

## 2017-11-19 VITALS — BP 126/80 | HR 86 | Temp 98.3°F | Resp 16 | Ht 67.0 in | Wt 229.8 lb

## 2017-11-19 DIAGNOSIS — B373 Candidiasis of vulva and vagina: Secondary | ICD-10-CM | POA: Diagnosis not present

## 2017-11-19 DIAGNOSIS — F4001 Agoraphobia with panic disorder: Secondary | ICD-10-CM | POA: Diagnosis not present

## 2017-11-19 DIAGNOSIS — J324 Chronic pansinusitis: Secondary | ICD-10-CM

## 2017-11-19 DIAGNOSIS — B3731 Acute candidiasis of vulva and vagina: Secondary | ICD-10-CM

## 2017-11-19 DIAGNOSIS — N9089 Other specified noninflammatory disorders of vulva and perineum: Secondary | ICD-10-CM

## 2017-11-19 MED ORDER — ALPRAZOLAM 0.5 MG PO TABS: 0.5000 mg | ORAL_TABLET | Freq: Three times a day (TID) | ORAL | 0 refills | Status: DC | PRN

## 2017-11-19 MED ORDER — AMOXICILLIN-POT CLAVULANATE 875-125 MG PO TABS: 1.0000 | ORAL_TABLET | Freq: Two times a day (BID) | ORAL | 0 refills | Status: DC

## 2017-11-19 MED ORDER — FLUTICASONE PROPIONATE 50 MCG/ACT NA SUSP: 2.0000 | Freq: Every day | NASAL | 6 refills | Status: DC

## 2017-11-19 MED ORDER — FLUCONAZOLE 150 MG PO TABS
ORAL_TABLET | ORAL | 2 refills | Status: DC
Start: 2017-11-19 — End: 2018-01-09

## 2017-11-19 NOTE — Patient Instructions (Signed)

## 2017-11-19 NOTE — Progress Notes (Signed)
Patient ID: Felicia Solis, female   DOB: May 26, 1966, 52 y.o.   MRN: [id number]    Subjective:  I acted as a Education administrator for Dr. Carollee Herter.  [name], [place]   Patient ID: Felicia Solis, female    DOB: 1966/04/05, 52 y.o.   MRN: [id number]  Chief Complaint  Patient presents with  . Cough  . need medication for flying    HPI  Patient is in today for cough and need something for flying.  Coughing for 3 weeks +sinus tenderness.  No sore throat.  + sinus headache/ pressure.   No otc meds.  + yellow mucus.     Patient Care Team: Carollee Herter, Alferd Apa, DO as PCP - General (Family Medicine) Karl Luke, MD as Referring Physician (Optometry) Izora Gala, MD as Consulting Physician (Otolaryngology) Lavonia Drafts, MD as Consulting Physician (Obstetrics and Gynecology)   Past Medical History:  Diagnosis Date  . Anemia    anemia  when pregnant in 2002  . Anxiety   . Arthritis    "knees and back" (09/28/2016)  . Asthma 5 yrs ago    told she had asthma.. does not remember doctors name.   . Chronic lower back pain   . Depression    takes citalopram  . History of hiatal hernia   . History of kidney stones   . Hyperlipidemia   . Hypothyroidism   . LBBB (left bundle branch block)    rate-related; identified on stress test 03/21/16  . Migraine    "weekly" (09/28/2016)  . 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 Shortness of breath    with exertion  . Thyroid disease   . Type II diabetes mellitus ([place]) dx'd 2016    Past Surgical History:  Procedure Laterality Date  . DILATION AND CURETTAGE OF UTERUS  1999  . ENDOMETRIAL ABLATION    . LAPAROSCOPY     years ago ..pt does not know where surgery was done ... states" my stomach would blow up then go down"  . THYROIDECTOMY  09/28/2011   Procedure: THYROIDECTOMY;  Surgeon: Izora Gala, MD;  Location: [facility];  Service: ENT;  Laterality: Right;  RIGHT THYROIDECTOMY WITH FROZEN SECTION  .  THYROIDECTOMY  09/28/2016   completion of thyroidectomy/notes 09/28/2016  . THYROIDECTOMY Left 09/28/2016   Procedure: COMPLETION OF THYROIDECTOMY;  Surgeon: Izora Gala, MD;  Location: [facility];  Service: ENT;  Laterality: Left;  . TUBAL LIGATION      Family History  Problem Relation Age of Onset  . Diabetes Mother   . Hypertension Mother   . Hyperlipidemia Mother   . Gallbladder disease Mother   . Heart disease Mother   . Diabetes Sister   . Hypertension Father   . Hyperlipidemia Father   . Colon polyps Father   . Ovarian cancer Maternal Grandmother   . Gallbladder disease Maternal Grandmother   . Colon cancer Paternal Grandfather   . Breast cancer Maternal Aunt   . Heart disease Other        early cad  . Diabetes Brother   . Heart attack Brother   . Anesthesia problems Neg Hx   . Hypotension Neg Hx   . Malignant hyperthermia Neg Hx   . Pseudochol deficiency Neg Hx   . Esophageal cancer Neg Hx     Social History   Socioeconomic History  . Marital status: Married    Spouse name: Not  on file  . Number of children: 3  . Years of education: Not on file  . Highest education level: Not on file  Occupational History  . Occupation: CNA  Social Needs  . Financial resource strain: Not on file  . Food insecurity:    Worry: Not on file    Inability: Not on file  . Transportation needs:    Medical: Not on file    Non-medical: Not on file  Tobacco Use  . Smoking status: Never Smoker  . Smokeless tobacco: Never Used  Substance and Sexual Activity  . Alcohol use: No    Alcohol/week: 0.0 oz  . Drug use: No  . Sexual activity: Yes    Partners: Male    Birth control/protection: Surgical  Lifestyle  . Physical activity:    Days per week: Not on file    Minutes per session: Not on file  . Stress: Not on file  Relationships  . Social connections:    Talks on phone: Not on file    Gets together: Not on file    Attends religious service: Not on file    Active member of club  or organization: Not on file    Attends meetings of clubs or organizations: Not on file    Relationship status: Not on file  . Intimate partner violence:    Fear of current or ex partner: Not on file    Emotionally abused: Not on file    Physically abused: Not on file    Forced sexual activity: Not on file  Other Topics Concern  . Not on file  Social History Narrative   No exercise--  Walking and lifting pts at work    Outpatient Medications Prior to Visit  Medication Sig Dispense Refill  . acetaminophen (TYLENOL) 500 MG tablet Take 1,000 mg by mouth every 6 (six) hours as needed for mild pain.    Marland Kitchen aspirin EC 81 MG tablet Take 1 tablet (81 mg total) by mouth daily. 90 tablet 3  . blood glucose meter kit and supplies KIT Dispense based on patient and insurance preference. Use up to four times daily as directed. (FOR ICD-9 250.00, 250.01). 1 each 0  . Cholecalciferol (VITAMIN D3) 2000 units TABS Take 2,000 Units by mouth daily.     . citalopram (CELEXA) 20 MG tablet Take 1 tablet (20 mg total) by mouth daily. 90 tablet 3  . cyanocobalamin 500 MCG tablet Take 500 mcg by mouth daily.    . cyclobenzaprine (FLEXERIL) 10 MG tablet Take 1 tablet (10 mg total) by mouth at bedtime. (Patient taking differently: Take 10 mg by mouth daily as needed for muscle spasms. ) 10 tablet 0  . diclofenac (VOLTAREN) 75 MG EC tablet   0  . Flaxseed, Linseed, (FLAXSEED OIL PO) Take 1,300 mg by mouth daily.    . fluticasone (FLONASE) 50 MCG/ACT nasal spray Place 2 sprays into both nostrils daily. (Patient taking differently: Place 2 sprays into both nostrils daily as needed for allergies. ) 16 g 1  . Ginkgo Biloba Extract 60 MG CAPS Take 60 mg by mouth daily.    Marland Kitchen glucose blood (ONETOUCH VERIO) test strip Onetouch Verio Check blood sugar twice daily 100 each 12  . ibuprofen (ADVIL,MOTRIN) 800 MG tablet TK 1 T PO  Q 6-8 H PRF PAIN  0  . levothyroxine (SYNTHROID) 100 MCG tablet Take 1 tablet (100 mcg total) by  mouth daily. 30 tablet 6  . losartan (COZAAR) 25 MG  tablet Take 1 tablet (25 mg total) by mouth daily. 90 tablet 1  . methocarbamol (ROBAXIN) 500 MG tablet Take 1 tablet (500 mg total) by mouth 2 (two) times daily. 20 tablet 0  . Omega-3 Fatty Acids (FISH OIL) 1200 MG CAPS Take 1,200 mg by mouth daily.    Glory Rosebush DELICA LANCETS FINE MISC Check blood sugar twice daily 100 each 12  . pantoprazole (PROTONIX) 40 MG tablet Take 1 tablet (40 mg total) by mouth daily. 90 tablet 3  . simvastatin (ZOCOR) 40 MG tablet Take 1 tablet (40 mg total) by mouth at bedtime. 90 tablet 1  . SitaGLIPtin-MetFORMIN HCl (JANUMET XR) [phone] MG TB24 TAKE 1 TABLET BY MOUTH ONCE DAILY 90 tablet 1  . fluconazole (DIFLUCAN) 150 MG tablet Take 1 by mouth once; may repeat in 3 days if needed. 2 tablet 2   No facility-administered medications prior to visit.     Allergies  Allergen Reactions  . Lisinopril Cough    Review of Systems  Constitutional: Negative for fever and malaise/fatigue.  HENT: Positive for sinus pain. Negative for congestion and sore throat.   Eyes: Negative for blurred vision.  Respiratory: Positive for cough. Negative for shortness of breath.   Cardiovascular: Negative for chest pain, palpitations and leg swelling.  Gastrointestinal: Negative for vomiting.  Musculoskeletal: Negative for back pain.  Skin: Negative for rash.  Neurological: Negative for loss of consciousness and headaches.       Objective:    Physical Exam  Constitutional: She is oriented to person, place, and time. She appears well-developed and well-nourished.  HENT:  Right Ear: External ear normal.  Left Ear: External ear normal.  Nose: Right sinus exhibits maxillary sinus tenderness and frontal sinus tenderness. Left sinus exhibits maxillary sinus tenderness and frontal sinus tenderness.  + PND + errythema  Eyes: Conjunctivae are normal. Right eye exhibits no discharge. Left eye exhibits no discharge.    Cardiovascular: Normal rate, regular rhythm and normal heart sounds.  No murmur heard. Pulmonary/Chest: Effort normal and breath sounds normal. No respiratory distress. She has no wheezes. She has no rales. She exhibits no tenderness.  Musculoskeletal: She exhibits no edema.  Lymphadenopathy:    She has cervical adenopathy.  Neurological: She is alert and oriented to person, place, and time.  Nursing note and vitals reviewed.   BP 126/80 (BP Location: Right Arm, Cuff Size: Large)   Pulse 86   Temp 98.3 F (36.8 C) (Oral)   Resp 16   Ht _0  (1.702 m)   Wt 229 lb 12.8 oz (104.2 kg)   SpO2 96%   BMI 35.99 kg/m  Wt Readings from Last 3 Encounters:  11/19/17 229 lb 12.8 oz (104.2 kg)  09/27/17 230 lb (104.3 kg)  09/17/17 232 lb (105.2 kg)   BP Readings from Last 3 Encounters:  11/19/17 126/80  09/27/17 122/63  09/17/17 118/78     Immunization History  Administered Date(s) Administered  . Tdap 09/27/2017  . Zoster Recombinat (Shingrix) 04/04/2017, 06/07/2017    Health Maintenance  Topic Date Due  . PNEUMOCOCCAL POLYSACCHARIDE VACCINE (1) 10/12/1966  . OPHTHALMOLOGY EXAM  09/18/2017  . INFLUENZA VACCINE  01/23/2018  . HEMOGLOBIN A1C  03/20/2018  . MAMMOGRAM  05/15/2018  . FOOT EXAM  09/28/2018  . PAP SMEAR  09/11/2020  . COLONOSCOPY  12/02/2024  . TETANUS/TDAP  09/28/2027  . HIV Screening  Completed    Lab Results  Component Value Date   WBC 10.1 08/05/2017  HGB 12.3 08/05/2017   HCT 36.7 08/05/2017   PLT 292 08/05/2017   GLUCOSE 101 (H) 09/17/2017   CHOL 156 09/17/2017   TRIG 140.0 09/17/2017   HDL 34.70 (L) 09/17/2017   LDLCALC 93 09/17/2017   ALT 25 09/17/2017   AST 21 09/17/2017   NA 140 09/17/2017   K 3.9 09/17/2017   CL 103 09/17/2017   CREATININE 0.53 09/17/2017   BUN 13 09/17/2017   CO2 29 09/17/2017   TSH 0.65 09/17/2017   HGBA1C 6.9 (H) 09/17/2017   MICROALBUR 2.5 (H) 04/04/2017    Lab Results  Component Value Date   TSH 0.65  09/17/2017   Lab Results  Component Value Date   WBC 10.1 08/05/2017   HGB 12.3 08/05/2017   HCT 36.7 08/05/2017   MCV 85.9 08/05/2017   PLT 292 08/05/2017   Lab Results  Component Value Date   NA 140 09/17/2017   K 3.9 09/17/2017   CO2 29 09/17/2017   GLUCOSE 101 (H) 09/17/2017   BUN 13 09/17/2017   CREATININE 0.53 09/17/2017   BILITOT 0.4 09/17/2017   ALKPHOS 42 09/17/2017   AST 21 09/17/2017   ALT 25 09/17/2017   PROT 7.2 09/17/2017   ALBUMIN 4.2 09/17/2017   CALCIUM 9.2 09/17/2017   ANIONGAP 7 08/05/2017   GFR 155.23 09/17/2017   Lab Results  Component Value Date   CHOL 156 09/17/2017   Lab Results  Component Value Date   HDL 34.70 (L) 09/17/2017   Lab Results  Component Value Date   LDLCALC 93 09/17/2017   Lab Results  Component Value Date   TRIG 140.0 09/17/2017   Lab Results  Component Value Date   CHOLHDL 4 09/17/2017   Lab Results  Component Value Date   HGBA1C 6.9 (H) 09/17/2017         Assessment & Plan:   Problem List Items Addressed This Visit      Unprioritized   Agoraphobia with panic attacks    Xanax giving for flying Pt to take 30 min prior to flying        Relevant Medications   ALPRAZolam (XANAX) 0.5 MG tablet   Pansinusitis - Primary    abx per orders flonase and antihistamine       Relevant Medications   fluconazole (DIFLUCAN) 150 MG tablet   amoxicillin-clavulanate (AUGMENTIN) 875-125 MG tablet   fluticasone (FLONASE) 50 MCG/ACT nasal spray    Other Visit Diagnoses    Vulvar irritation       Relevant Medications   fluconazole (DIFLUCAN) 150 MG tablet   Yeast infection involving the vagina and surrounding area       Relevant Medications   fluconazole (DIFLUCAN) 150 MG tablet      I am having Felicia Solis start on amoxicillin-clavulanate, fluticasone, and ALPRAZolam. I am also having her maintain her acetaminophen, ONETOUCH DELICA LANCETS FINE, vitamin B-12, Ginkgo Biloba Extract, (Flaxseed, Linseed,  (FLAXSEED OIL PO)), Fish Oil, Vitamin D3, aspirin EC, fluticasone, cyclobenzaprine, levothyroxine, methocarbamol, ibuprofen, citalopram, losartan, glucose blood, diclofenac, blood glucose meter kit and supplies, SitaGLIPtin-MetFORMIN HCl, simvastatin, pantoprazole, and fluconazole.  Meds ordered this encounter  Medications  . fluconazole (DIFLUCAN) 150 MG tablet    Sig: Take 1 by mouth once; may repeat in 3 days if needed.    Dispense:  2 tablet    Refill:  2  . amoxicillin-clavulanate (AUGMENTIN) 875-125 MG tablet    Sig: Take 1 tablet by mouth 2 (two) times daily.  Dispense:  20 tablet    Refill:  0  . fluticasone (FLONASE) 50 MCG/ACT nasal spray    Sig: Place 2 sprays into both nostrils daily.    Dispense:  16 g    Refill:  6  . ALPRAZolam (XANAX) 0.5 MG tablet    Sig: Take 1 tablet (0.5 mg total) by mouth 3 (three) times daily as needed for anxiety.    Dispense:  10 tablet    Refill:  0    CMA served as scribe during this visit. History, Physical and Plan performed by medical provider. Documentation and orders reviewed and attested to.  [name], DO

## 2017-11-19 NOTE — Assessment & Plan Note (Signed)
abx per orders  flonase and antihistamine  

## 2017-11-19 NOTE — Assessment & Plan Note (Signed)
Xanax giving for flying Pt to take 30 min prior to flying

## 2017-11-26 ENCOUNTER — Encounter: Payer: BLUE CROSS/BLUE SHIELD | Admitting: Family Medicine

## 2017-12-02 ENCOUNTER — Encounter: Payer: Self-pay | Admitting: Family Medicine

## 2017-12-02 ENCOUNTER — Ambulatory Visit: Payer: BLUE CROSS/BLUE SHIELD | Admitting: Family Medicine

## 2017-12-02 VITALS — BP 128/76 | HR 94 | Temp 98.7°F | Resp 16 | Wt 228.6 lb

## 2017-12-02 DIAGNOSIS — E785 Hyperlipidemia, unspecified: Secondary | ICD-10-CM | POA: Diagnosis not present

## 2017-12-02 DIAGNOSIS — R319 Hematuria, unspecified: Secondary | ICD-10-CM | POA: Diagnosis not present

## 2017-12-02 DIAGNOSIS — E1169 Type 2 diabetes mellitus with other specified complication: Secondary | ICD-10-CM

## 2017-12-02 DIAGNOSIS — I10 Essential (primary) hypertension: Secondary | ICD-10-CM

## 2017-12-02 DIAGNOSIS — R112 Nausea with vomiting, unspecified: Secondary | ICD-10-CM

## 2017-12-02 LAB — POC URINALSYSI DIPSTICK (AUTOMATED)
BILIRUBIN UA: NEGATIVE
Glucose, UA: NEGATIVE
Ketones, UA: NEGATIVE
Leukocytes, UA: NEGATIVE
NITRITE UA: NEGATIVE
PH UA: 5.5 (ref 5.0–8.0)
PROTEIN UA: POSITIVE — AB
Spec Grav, UA: >=1.03 — AB (ref 1.010–1.025)
Urobilinogen, UA: 0.2 E.U./dL

## 2017-12-02 LAB — POCT URINE PREGNANCY: Preg Test, Ur: NEGATIVE

## 2017-12-02 MED ORDER — ONDANSETRON HCL 8 MG PO TABS: 8.0000 mg | ORAL_TABLET | Freq: Three times a day (TID) | ORAL | 0 refills | Status: DC | PRN

## 2017-12-02 NOTE — Progress Notes (Signed)
Subjective:  I acted as a Education administrator for [employer]. Yancey Flemings, [employer]   Patient ID: Felicia Solis, female    DOB: 12-05-1965, 52 y.o.   MRN: [id number]  Chief Complaint  Patient presents with  . Emesis    started this morning only twice today  . Abdominal Pain    HPI  Patient is in today for vomiting x 2 --  Now nauseous and bloated.  One other co worker is sick as well.  No fever,  Some loose stools.    Patient Care Team: Carollee Herter, Alferd Apa, DO as PCP - General (Family Medicine) Karl Luke, MD as Referring Physician (Optometry) Izora Gala, MD as Consulting Physician (Otolaryngology) Lavonia Drafts, MD as Consulting Physician (Obstetrics and Gynecology)   Past Medical History:  Diagnosis Date  . Anemia    anemia  when pregnant in 2002  . Anxiety   . Arthritis    "knees and back" (09/28/2016)  . Asthma 5 yrs ago    told she had asthma.. does not remember doctors name.   . Chronic lower back pain   . Depression    takes citalopram  . History of hiatal hernia   . History of kidney stones   . Hyperlipidemia   . Hypothyroidism   . LBBB (left bundle branch block)    rate-related; identified on stress test 03/21/16  . Migraine    "weekly" (09/28/2016)  . 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 Shortness of breath    with exertion  . Thyroid disease   . Type II diabetes mellitus ([place]) dx'd 2016    Past Surgical History:  Procedure Laterality Date  . DILATION AND CURETTAGE OF UTERUS  1999  . ENDOMETRIAL ABLATION    . LAPAROSCOPY     years ago ..pt does not know where surgery was done ... states" my stomach would blow up then go down"  . THYROIDECTOMY  09/28/2011   Procedure: THYROIDECTOMY;  Surgeon: Izora Gala, MD;  Location: [facility];  Service: ENT;  Laterality: Right;  RIGHT THYROIDECTOMY WITH FROZEN SECTION  . THYROIDECTOMY  09/28/2016   completion of thyroidectomy/notes 09/28/2016  . THYROIDECTOMY Left  09/28/2016   Procedure: COMPLETION OF THYROIDECTOMY;  Surgeon: Izora Gala, MD;  Location: [facility];  Service: ENT;  Laterality: Left;  . TUBAL LIGATION      Family History  Problem Relation Age of Onset  . Diabetes Mother   . Hypertension Mother   . Hyperlipidemia Mother   . Gallbladder disease Mother   . Heart disease Mother   . Diabetes Sister   . Hypertension Father   . Hyperlipidemia Father   . Colon polyps Father   . Ovarian cancer Maternal Grandmother   . Gallbladder disease Maternal Grandmother   . Colon cancer Paternal Grandfather   . Breast cancer Maternal Aunt   . Heart disease Other        early cad  . Diabetes Brother   . Heart attack Brother   . Anesthesia problems Neg Hx   . Hypotension Neg Hx   . Malignant hyperthermia Neg Hx   . Pseudochol deficiency Neg Hx   . Esophageal cancer Neg Hx     Social History   Socioeconomic History  . Marital status: Married    Spouse name: Not on file  . Number of children: 3  . Years of education: Not on file  . Highest education  level: Not on file  Occupational History  . Occupation: CNA  Social Needs  . Financial resource strain: Not on file  . Food insecurity:    Worry: Not on file    Inability: Not on file  . Transportation needs:    Medical: Not on file    Non-medical: Not on file  Tobacco Use  . Smoking status: Never Smoker  . Smokeless tobacco: Never Used  Substance and Sexual Activity  . Alcohol use: No    Alcohol/week: 0.0 oz  . Drug use: No  . Sexual activity: Yes    Partners: Male    Birth control/protection: Surgical  Lifestyle  . Physical activity:    Days per week: Not on file    Minutes per session: Not on file  . Stress: Not on file  Relationships  . Social connections:    Talks on phone: Not on file    Gets together: Not on file    Attends religious service: Not on file    Active member of club or organization: Not on file    Attends meetings of clubs or organizations: Not on file     Relationship status: Not on file  . Intimate partner violence:    Fear of current or ex partner: Not on file    Emotionally abused: Not on file    Physically abused: Not on file    Forced sexual activity: Not on file  Other Topics Concern  . Not on file  Social History Narrative   No exercise--  Walking and lifting pts at work    Outpatient Medications Prior to Visit  Medication Sig Dispense Refill  . acetaminophen (TYLENOL) 500 MG tablet Take 1,000 mg by mouth every 6 (six) hours as needed for mild pain.    Marland Kitchen ALPRAZolam (XANAX) 0.5 MG tablet Take 1 tablet (0.5 mg total) by mouth 3 (three) times daily as needed for anxiety. 10 tablet 0  . amoxicillin-clavulanate (AUGMENTIN) 875-125 MG tablet Take 1 tablet by mouth 2 (two) times daily. 20 tablet 0  . aspirin EC 81 MG tablet Take 1 tablet (81 mg total) by mouth daily. 90 tablet 3  . blood glucose meter kit and supplies KIT Dispense based on patient and insurance preference. Use up to four times daily as directed. (FOR ICD-9 250.00, 250.01). 1 each 0  . Cholecalciferol (VITAMIN D3) 2000 units TABS Take 2,000 Units by mouth daily.     . citalopram (CELEXA) 20 MG tablet Take 1 tablet (20 mg total) by mouth daily. 90 tablet 3  . cyanocobalamin 500 MCG tablet Take 500 mcg by mouth daily.    . cyclobenzaprine (FLEXERIL) 10 MG tablet Take 1 tablet (10 mg total) by mouth at bedtime. (Patient taking differently: Take 10 mg by mouth daily as needed for muscle spasms. ) 10 tablet 0  . diclofenac (VOLTAREN) 75 MG EC tablet   0  . Flaxseed, Linseed, (FLAXSEED OIL PO) Take 1,300 mg by mouth daily.    . fluconazole (DIFLUCAN) 150 MG tablet Take 1 by mouth once; may repeat in 3 days if needed. 2 tablet 2  . fluticasone (FLONASE) 50 MCG/ACT nasal spray Place 2 sprays into both nostrils daily. (Patient taking differently: Place 2 sprays into both nostrils daily as needed for allergies. ) 16 g 1  . fluticasone (FLONASE) 50 MCG/ACT nasal spray Place 2 sprays  into both nostrils daily. 16 g 6  . Ginkgo Biloba Extract 60 MG CAPS Take 60 mg by mouth  daily.    . glucose blood (ONETOUCH VERIO) test strip Onetouch Verio Check blood sugar twice daily 100 each 12  . ibuprofen (ADVIL,MOTRIN) 800 MG tablet TK 1 T PO  Q 6-8 H PRF PAIN  0  . levothyroxine (SYNTHROID) 100 MCG tablet Take 1 tablet (100 mcg total) by mouth daily. 30 tablet 6  . losartan (COZAAR) 25 MG tablet Take 1 tablet (25 mg total) by mouth daily. 90 tablet 1  . methocarbamol (ROBAXIN) 500 MG tablet Take 1 tablet (500 mg total) by mouth 2 (two) times daily. 20 tablet 0  . Omega-3 Fatty Acids (FISH OIL) 1200 MG CAPS Take 1,200 mg by mouth daily.    Glory Rosebush DELICA LANCETS FINE MISC Check blood sugar twice daily 100 each 12  . pantoprazole (PROTONIX) 40 MG tablet Take 1 tablet (40 mg total) by mouth daily. 90 tablet 3  . simvastatin (ZOCOR) 40 MG tablet Take 1 tablet (40 mg total) by mouth at bedtime. 90 tablet 1  . SitaGLIPtin-MetFORMIN HCl (JANUMET XR) [phone] MG TB24 TAKE 1 TABLET BY MOUTH ONCE DAILY 90 tablet 1   No facility-administered medications prior to visit.     Allergies  Allergen Reactions  . Lisinopril Cough    Review of Systems  Constitutional: Negative for chills, fever and malaise/fatigue.  HENT: Negative for congestion and hearing loss.   Eyes: Negative for discharge.  Respiratory: Negative for cough, sputum production and shortness of breath.   Cardiovascular: Negative for chest pain, palpitations and leg swelling.  Gastrointestinal: Positive for abdominal pain, nausea and vomiting. Negative for blood in stool, constipation, diarrhea and heartburn.  Genitourinary: Negative for dysuria, frequency, hematuria and urgency.  Musculoskeletal: Negative for back pain, falls and myalgias.  Skin: Negative for rash.  Neurological: Negative for dizziness, sensory change, loss of consciousness, weakness and headaches.  Endo/Heme/Allergies: Negative for environmental allergies.  Does not bruise/bleed easily.  Psychiatric/Behavioral: Negative for depression and suicidal ideas. The patient is not nervous/anxious and does not have insomnia.        Objective:    Physical Exam  Constitutional: She is oriented to person, place, and time. She appears well-developed and well-nourished. No distress.  HENT:  Head: Normocephalic and atraumatic.  Right Ear: External ear normal.  Left Ear: External ear normal.  Nose: Nose normal.  Mouth/Throat: Oropharynx is clear and moist.  Eyes: Pupils are equal, round, and reactive to light. Conjunctivae and EOM are normal. Right eye exhibits no discharge. Left eye exhibits no discharge.  Neck: Normal range of motion. Neck supple. No JVD present. Carotid bruit is not present. No thyromegaly present.  Cardiovascular: Normal rate, regular rhythm, normal heart sounds and intact distal pulses.  No murmur heard. Pulmonary/Chest: Effort normal and breath sounds normal. No respiratory distress. She has no wheezes. She has no rales. She exhibits no tenderness.  Abdominal: Soft. Bowel sounds are normal. She exhibits distension. She exhibits no mass. There is no tenderness. There is no rebound and no guarding.  Genitourinary: Vagina normal and uterus normal. Rectal exam shows guaiac negative stool. No vaginal discharge found.  Musculoskeletal: Normal range of motion. She exhibits no edema or tenderness.  Lymphadenopathy:    She has no cervical adenopathy.  Neurological: She is alert and oriented to person, place, and time. She has normal reflexes. No cranial nerve deficit.  Skin: Skin is warm and dry. No rash noted. She is not diaphoretic. No erythema.  Psychiatric: She has a normal mood and affect. Her behavior is normal.  Judgment and thought content normal.  Nursing note and vitals reviewed.   BP 128/76 (BP Location: Left Arm, Patient Position: Sitting, Cuff Size: Large)   Pulse 94   Temp 98.7 F (37.1 C) (Oral)   Resp 16   Wt 228 lb 9.6  oz (103.7 kg)   SpO2 100%   BMI 35.80 kg/m  Wt Readings from Last 3 Encounters:  12/02/17 228 lb 9.6 oz (103.7 kg)  11/19/17 229 lb 12.8 oz (104.2 kg)  09/27/17 230 lb (104.3 kg)   BP Readings from Last 3 Encounters:  12/02/17 128/76  11/19/17 126/80  09/27/17 122/63     Immunization History  Administered Date(s) Administered  . Tdap 09/27/2017  . Zoster Recombinat (Shingrix) 04/04/2017, 06/07/2017    Health Maintenance  Topic Date Due  . PNEUMOCOCCAL POLYSACCHARIDE VACCINE (1) 10/12/1966  . OPHTHALMOLOGY EXAM  09/18/2017  . INFLUENZA VACCINE  01/23/2018  . HEMOGLOBIN A1C  03/20/2018  . MAMMOGRAM  05/15/2018  . FOOT EXAM  09/28/2018  . PAP SMEAR  09/11/2020  . COLONOSCOPY  12/02/2024  . TETANUS/TDAP  09/28/2027  . HIV Screening  Completed    Lab Results  Component Value Date   WBC 10.1 08/05/2017   HGB 12.3 08/05/2017   HCT 36.7 08/05/2017   PLT 292 08/05/2017   GLUCOSE 101 (H) 09/17/2017   CHOL 156 09/17/2017   TRIG 140.0 09/17/2017   HDL 34.70 (L) 09/17/2017   LDLCALC 93 09/17/2017   ALT 25 09/17/2017   AST 21 09/17/2017   NA 140 09/17/2017   K 3.9 09/17/2017   CL 103 09/17/2017   CREATININE 0.53 09/17/2017   BUN 13 09/17/2017   CO2 29 09/17/2017   TSH 0.65 09/17/2017   HGBA1C 6.9 (H) 09/17/2017   MICROALBUR 2.5 (H) 04/04/2017    Lab Results  Component Value Date   TSH 0.65 09/17/2017   Lab Results  Component Value Date   WBC 10.1 08/05/2017   HGB 12.3 08/05/2017   HCT 36.7 08/05/2017   MCV 85.9 08/05/2017   PLT 292 08/05/2017   Lab Results  Component Value Date   NA 140 09/17/2017   K 3.9 09/17/2017   CO2 29 09/17/2017   GLUCOSE 101 (H) 09/17/2017   BUN 13 09/17/2017   CREATININE 0.53 09/17/2017   BILITOT 0.4 09/17/2017   ALKPHOS 42 09/17/2017   AST 21 09/17/2017   ALT 25 09/17/2017   PROT 7.2 09/17/2017   ALBUMIN 4.2 09/17/2017   CALCIUM 9.2 09/17/2017   ANIONGAP 7 08/05/2017   GFR 155.23 09/17/2017   Lab Results  Component  Value Date   CHOL 156 09/17/2017   Lab Results  Component Value Date   HDL 34.70 (L) 09/17/2017   Lab Results  Component Value Date   LDLCALC 93 09/17/2017   Lab Results  Component Value Date   TRIG 140.0 09/17/2017   Lab Results  Component Value Date   CHOLHDL 4 09/17/2017   Lab Results  Component Value Date   HGBA1C 6.9 (H) 09/17/2017         Assessment & Plan:   Problem List Items Addressed This Visit      Unprioritized   Essential hypertension    Well controlled, no changes to meds. Encouraged heart healthy diet such as the DASH diet and exercise as tolerated.       Hematuria   Relevant Orders   Urine Culture   Hyperlipidemia LDL goal <100   Hyperlipidemia LDL goal <70  Tolerating statin, encouraged heart healthy diet, avoid trans fats, minimize simple carbs and saturated fats. Increase exercise as tolerated      Nausea and vomiting - Primary    prob viral  zofran prn If vomiting con't --- go to ER Brat diet       Relevant Medications   ondansetron (ZOFRAN) 8 MG tablet   Other Relevant Orders   CBC with Differential/Platelet   Comprehensive metabolic panel   POCT Urinalysis Dipstick (Automated) (Completed)   POCT urine pregnancy (Completed)      I am having Felicia Solis start on ondansetron. I am also having her maintain her acetaminophen, ONETOUCH DELICA LANCETS FINE, vitamin B-12, Ginkgo Biloba Extract, (Flaxseed, Linseed, (FLAXSEED OIL PO)), Fish Oil, Vitamin D3, aspirin EC, fluticasone, cyclobenzaprine, levothyroxine, methocarbamol, ibuprofen, citalopram, losartan, glucose blood, diclofenac, blood glucose meter kit and supplies, SitaGLIPtin-MetFORMIN HCl, simvastatin, pantoprazole, fluconazole, amoxicillin-clavulanate, fluticasone, and ALPRAZolam.  Meds ordered this encounter  Medications  . ondansetron (ZOFRAN) 8 MG tablet    Sig: Take 1 tablet (8 mg total) by mouth every 8 (eight) hours as needed for nausea or vomiting.    Dispense:  20  tablet    Refill:  0    CMA served as scribe during this visit. History, Physical and Plan performed by medical provider. Documentation and orders reviewed and attested to.  [name], DO

## 2017-12-02 NOTE — Assessment & Plan Note (Signed)
Tolerating statin, encouraged heart healthy diet, avoid trans fats, minimize simple carbs and saturated fats. Increase exercise as tolerated 

## 2017-12-02 NOTE — Assessment & Plan Note (Signed)
Well controlled, no changes to meds. Encouraged heart healthy diet such as the DASH diet and exercise as tolerated.  °

## 2017-12-02 NOTE — Assessment & Plan Note (Addendum)
prob viral  zofran prn If vomiting con't --- go to ER Brat diet

## 2017-12-02 NOTE — Patient Instructions (Signed)
Nausea and Vomiting, Adult Feeling sick to your stomach (nausea) means that your stomach is upset or you feel like you have to throw up (vomit). Feeling more and more sick to your stomach can lead to throwing up. Throwing up happens when food and liquid from your stomach are thrown up and out the mouth. Throwing up can make you feel weak and cause you to get dehydrated. Dehydration can make you tired and thirsty, make you have a dry mouth, and make it so you pee (urinate) less often. Older adults and people with other diseases or a weak defense system (immune system) are at higher risk for dehydration. If you feel sick to your stomach or if you throw up, it is important to follow instructions from your doctor about how to take care of yourself. Follow these instructions at home: Eating and drinking Follow these instructions as told by your doctor:  Take an oral rehydration solution (ORS). This is a drink that is sold at pharmacies and stores.  Drink clear fluids in small amounts as you are able, such as: ? Water. ? Ice chips. ? Diluted fruit juice. ? Low-calorie sports drinks.  Eat bland, easy-to-digest foods in small amounts as you are able, such as: ? Bananas. ? Applesauce. ? Rice. ? Low-fat (lean) meats. ? Toast. ? Crackers.  Avoid fluids that have a lot of sugar or caffeine in them.  Avoid alcohol.  Avoid spicy or fatty foods.  General instructions  Drink enough fluid to keep your pee (urine) clear or pale yellow.  Wash your hands often. If you cannot use soap and water, use hand sanitizer.  Make sure that all people in your home wash their hands well and often.  Take over-the-counter and prescription medicines only as told by your doctor.  Rest at home while you get better.  Watch your condition for any changes.  Breathe slowly and deeply when you feel sick to your stomach.  Keep all follow-up visits as told by your doctor. This is important. Contact a doctor  if:  You have a fever.  You cannot keep fluids down.  Your symptoms get worse.  You have new symptoms.  You feel sick to your stomach for more than two days.  You feel light-headed or dizzy.  You have a headache.  You have muscle cramps. Get help right away if:  You have pain in your chest, neck, arm, or jaw.  You feel very weak or you pass out (faint).  You throw up again and again.  You see blood in your throw-up.  Your throw-up looks like black coffee grounds.  You have bloody or black poop (stools) or poop that look like tar.  You have a very bad headache, a stiff neck, or both.  You have a rash.  You have very bad pain, cramping, or bloating in your belly (abdomen).  You have trouble breathing.  You are breathing very quickly.  Your heart is beating very quickly.  Your skin feels cold and clammy.  You feel confused.  You have pain when you pee.  You have signs of dehydration, such as: ? Dark pee, hardly any pee, or no pee. ? Cracked lips. ? Dry mouth. ? Sunken eyes. ? Sleepiness. ? Weakness. These symptoms may be an emergency. Do not wait to see if the symptoms will go away. Get medical help right away. Call your local emergency services (911 in the [place] Do not drive yourself to the hospital. This information is   not intended to replace advice given to you by your health care provider. Make sure you discuss any questions you have with your health care provider. Document Released: 11/28/2007 Document Revised: 12/30/2015 Document Reviewed: 02/15/2015 Elsevier Interactive Patient Education  2018 [employer]  

## 2017-12-03 ENCOUNTER — Encounter: Payer: Self-pay | Admitting: *Deleted

## 2017-12-03 LAB — COMPREHENSIVE METABOLIC PANEL
ALBUMIN: 4.6 g/dL (ref 3.5–5.2)
ALT: 21 U/L (ref 0–35)
AST: 17 U/L (ref 0–37)
Alkaline Phosphatase: 42 U/L (ref 39–117)
BILIRUBIN TOTAL: 0.7 mg/dL (ref 0.2–1.2)
BUN: 10 mg/dL (ref 6–23)
CALCIUM: 9.6 mg/dL (ref 8.4–10.5)
CO2: 30 mEq/L (ref 19–32)
CREATININE: 0.57 mg/dL (ref 0.40–1.20)
Chloride: 104 mEq/L (ref 96–112)
GFR: 142.61 mL/min (ref >60.00)
Glucose, Bld: 149 mg/dL — ABNORMAL HIGH (ref 70–99)
Potassium: 4.3 mEq/L (ref 3.5–5.1)
SODIUM: 142 meq/L (ref 135–145)
TOTAL PROTEIN: 7.2 g/dL (ref 6.0–8.3)

## 2017-12-03 LAB — CBC WITH DIFFERENTIAL/PLATELET
BASOS ABS: 0 10*3/uL (ref 0.0–0.1)
Basophils Relative: 0.5 % (ref 0.0–3.0)
EOS ABS: 0.1 10*3/uL (ref 0.0–0.7)
Eosinophils Relative: 1.1 % (ref 0.0–5.0)
HEMATOCRIT: 39.3 % (ref 36.0–46.0)
HEMOGLOBIN: 12.9 g/dL (ref 12.0–15.0)
LYMPHS PCT: 22.6 % (ref 12.0–46.0)
Lymphs Abs: 1.9 10*3/uL (ref 0.7–4.0)
MCHC: 32.7 g/dL (ref 30.0–36.0)
MCV: 86.4 fl (ref 78.0–100.0)
MONO ABS: 0.4 10*3/uL (ref 0.1–1.0)
Monocytes Relative: 4.7 % (ref 3.0–12.0)
Neutro Abs: 6 10*3/uL (ref 1.4–7.7)
Neutrophils Relative %: 71.1 % (ref 43.0–77.0)
Platelets: 276 10*3/uL (ref 150.0–400.0)
RBC: 4.55 Mil/uL (ref 3.87–5.11)
RDW: 14 % (ref 11.5–15.5)
WBC: 8.4 10*3/uL (ref 4.0–10.5)

## 2017-12-03 LAB — LIPID PANEL
CHOL/HDL RATIO: 3
Cholesterol: 103 mg/dL (ref 0–200)
HDL: 29.9 mg/dL — AB (ref >39.00)
LDL Cholesterol: 50 mg/dL (ref 0–99)
NonHDL: 73.59
TRIGLYCERIDES: 116 mg/dL (ref 0.0–149.0)
VLDL: 23.2 mg/dL (ref 0.0–40.0)

## 2017-12-03 LAB — URINE CULTURE
MICRO NUMBER: 90693524
SPECIMEN QUALITY: ADEQUATE

## 2017-12-09 ENCOUNTER — Other Ambulatory Visit: Payer: Self-pay | Admitting: Family Medicine

## 2017-12-09 DIAGNOSIS — I10 Essential (primary) hypertension: Secondary | ICD-10-CM

## 2017-12-11 DIAGNOSIS — Z111 Encounter for screening for respiratory tuberculosis: Secondary | ICD-10-CM | POA: Diagnosis not present

## 2018-01-01 DIAGNOSIS — H524 Presbyopia: Secondary | ICD-10-CM | POA: Diagnosis not present

## 2018-01-01 DIAGNOSIS — H52223 Regular astigmatism, bilateral: Secondary | ICD-10-CM | POA: Diagnosis not present

## 2018-01-01 DIAGNOSIS — H5213 Myopia, bilateral: Secondary | ICD-10-CM | POA: Diagnosis not present

## 2018-01-01 LAB — HM DIABETES EYE EXAM

## 2018-01-03 ENCOUNTER — Encounter: Payer: Self-pay | Admitting: *Deleted

## 2018-01-09 ENCOUNTER — Ambulatory Visit: Payer: BLUE CROSS/BLUE SHIELD | Admitting: Family Medicine

## 2018-01-09 ENCOUNTER — Encounter: Payer: Self-pay | Admitting: Family Medicine

## 2018-01-09 VITALS — BP 126/80 | HR 89 | Temp 98.1°F | Resp 16 | Ht 67.0 in | Wt 239.2 lb

## 2018-01-09 DIAGNOSIS — M25561 Pain in right knee: Secondary | ICD-10-CM

## 2018-01-09 NOTE — Assessment & Plan Note (Signed)
Completely resolved rto prn

## 2018-01-09 NOTE — Patient Instructions (Signed)

## 2018-01-09 NOTE — Progress Notes (Signed)
Patient ID: Felicia Solis, female    DOB: 03-11-1966  Age: 52 y.o. MRN: [id number]    Subjective:  Subjective  HPI Felicia Solis presents for pain in R knee. She fell at home om [address] playing with grand kids.    Review of Systems  Constitutional: Negative for appetite change, diaphoresis, fatigue and unexpected weight change.  Eyes: Negative for pain, redness and visual disturbance.  Respiratory: Negative for cough, chest tightness, shortness of breath and wheezing.   Cardiovascular: Negative for chest pain, palpitations and leg swelling.  Endocrine: Negative for cold intolerance, heat intolerance, polydipsia, polyphagia and polyuria.  Genitourinary: Negative for difficulty urinating, dysuria and frequency.  Neurological: Negative for dizziness, light-headedness, numbness and headaches.    History Past Medical History:  Diagnosis Date  . Anemia    anemia  when pregnant in 2002  . Anxiety   . Arthritis    "knees and back" (09/28/2016)  . Asthma 5 yrs ago    told she had asthma.. does not remember doctors name.   . Chronic lower back pain   . Depression    takes citalopram  . History of hiatal hernia   . History of kidney stones   . Hyperlipidemia   . Hypothyroidism   . LBBB (left bundle branch block)    rate-related; identified on stress test 03/21/16  . Migraine    "weekly" (09/28/2016)  . 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 Shortness of breath    with exertion  . Thyroid disease   . Type II diabetes mellitus ([place]) dx'd 2016    She has a past surgical history that includes Endometrial ablation; Tubal ligation; laparoscopy; Dilation and curettage of uterus (1999); Thyroidectomy (09/28/2011); Thyroidectomy (09/28/2016); and Thyroidectomy (Left, 09/28/2016).   Her family history includes Breast cancer in her maternal aunt; Colon cancer in her paternal grandfather; Colon polyps in her father; Diabetes in her brother, mother,  and sister; Gallbladder disease in her maternal grandmother and mother; Heart attack in her brother; Heart disease in her mother and other; Hyperlipidemia in her father and mother; Hypertension in her father and mother; Ovarian cancer in her maternal grandmother.She reports that she has never smoked. She has never used smokeless tobacco. She reports that she does not drink alcohol or use drugs.  Current Outpatient Medications on File Prior to Visit  Medication Sig Dispense Refill  . acetaminophen (TYLENOL) 500 MG tablet Take 1,000 mg by mouth every 6 (six) hours as needed for mild pain.    Marland Kitchen ALPRAZolam (XANAX) 0.5 MG tablet Take 1 tablet (0.5 mg total) by mouth 3 (three) times daily as needed for anxiety. 10 tablet 0  . aspirin EC 81 MG tablet Take 1 tablet (81 mg total) by mouth daily. 90 tablet 3  . blood glucose meter kit and supplies KIT Dispense based on patient and insurance preference. Use up to four times daily as directed. (FOR ICD-9 250.00, 250.01). 1 each 0  . Cholecalciferol (VITAMIN D3) 2000 units TABS Take 2,000 Units by mouth daily.     . citalopram (CELEXA) 20 MG tablet Take 1 tablet (20 mg total) by mouth daily. 90 tablet 3  . cyanocobalamin 500 MCG tablet Take 500 mcg by mouth daily.    . cyclobenzaprine (FLEXERIL) 10 MG tablet Take 1 tablet (10 mg total) by mouth at bedtime. (Patient taking differently: Take 10 mg by mouth daily as needed for muscle  spasms. ) 10 tablet 0  . diclofenac (VOLTAREN) 75 MG EC tablet   0  . Flaxseed, Linseed, (FLAXSEED OIL PO) Take 1,300 mg by mouth daily.    . fluticasone (FLONASE) 50 MCG/ACT nasal spray Place 2 sprays into both nostrils daily. (Patient taking differently: Place 2 sprays into both nostrils daily as needed for allergies. ) 16 g 1  . fluticasone (FLONASE) 50 MCG/ACT nasal spray Place 2 sprays into both nostrils daily. 16 g 6  . Ginkgo Biloba Extract 60 MG CAPS Take 60 mg by mouth daily.    Marland Kitchen glucose blood (ONETOUCH VERIO) test strip  Onetouch Verio Check blood sugar twice daily 100 each 12  . ibuprofen (ADVIL,MOTRIN) 800 MG tablet TK 1 T PO  Q 6-8 H PRF PAIN  0  . levothyroxine (SYNTHROID) 100 MCG tablet Take 1 tablet (100 mcg total) by mouth daily. 30 tablet 6  . losartan (COZAAR) 25 MG tablet Take 1 tablet (25 mg total) by mouth daily. 90 tablet 1  . losartan (COZAAR) 25 MG tablet TAKE 1 TABLET BY MOUTH ONCE DAILY 90 tablet 1  . methocarbamol (ROBAXIN) 500 MG tablet Take 1 tablet (500 mg total) by mouth 2 (two) times daily. 20 tablet 0  . Omega-3 Fatty Acids (FISH OIL) 1200 MG CAPS Take 1,200 mg by mouth daily.    . ondansetron (ZOFRAN) 8 MG tablet Take 1 tablet (8 mg total) by mouth every 8 (eight) hours as needed for nausea or vomiting. 20 tablet 0  . ONETOUCH DELICA LANCETS FINE MISC Check blood sugar twice daily 100 each 12  . pantoprazole (PROTONIX) 40 MG tablet Take 1 tablet (40 mg total) by mouth daily. 90 tablet 3  . simvastatin (ZOCOR) 40 MG tablet Take 1 tablet (40 mg total) by mouth at bedtime. 90 tablet 1  . SitaGLIPtin-MetFORMIN HCl (JANUMET XR) [phone] MG TB24 TAKE 1 TABLET BY MOUTH ONCE DAILY 90 tablet 1   No current facility-administered medications on file prior to visit.      Objective:  Objective  Physical Exam  Constitutional: She is oriented to person, place, and time. She appears well-developed and well-nourished.  HENT:  Head: Normocephalic and atraumatic.  Eyes: Conjunctivae and EOM are normal.  Neck: Normal range of motion. Neck supple. No JVD present. Carotid bruit is not present. No thyromegaly present.  Cardiovascular: Normal rate, regular rhythm and normal heart sounds.  No murmur heard. Pulmonary/Chest: Effort normal and breath sounds normal. No respiratory distress. She has no wheezes. She has no rales. She exhibits no tenderness.  Musculoskeletal: Normal range of motion. She exhibits no edema, tenderness or deformity.  Neurological: She is alert and oriented to person, place, and  time.  Psychiatric: She has a normal mood and affect.  Nursing note and vitals reviewed.  BP 126/80 (BP Location: Right Arm, Cuff Size: Normal)   Pulse 89   Temp 98.1 F (36.7 C) (Oral)   Resp 16   Ht '5\' 7"'$  (1.702 m)   Wt 239 lb 3.2 oz (108.5 kg)   SpO2 98%   BMI 37.46 kg/m  Wt Readings from Last 3 Encounters:  01/09/18 239 lb 3.2 oz (108.5 kg)  12/02/17 228 lb 9.6 oz (103.7 kg)  11/19/17 229 lb 12.8 oz (104.2 kg)     Lab Results  Component Value Date   WBC 8.4 12/02/2017   HGB 12.9 12/02/2017   HCT 39.3 12/02/2017   PLT 276.0 12/02/2017   GLUCOSE 149 (H) 12/02/2017   CHOL  103 12/02/2017   TRIG 116.0 12/02/2017   HDL 29.90 (L) 12/02/2017   LDLCALC 50 12/02/2017   ALT 21 12/02/2017   AST 17 12/02/2017   NA 142 12/02/2017   K 4.3 12/02/2017   CL 104 12/02/2017   CREATININE 0.57 12/02/2017   BUN 10 12/02/2017   CO2 30 12/02/2017   TSH 0.65 09/17/2017   HGBA1C 6.9 (H) 09/17/2017   MICROALBUR 2.5 (H) 04/04/2017    Ct Abdomen Pelvis W Contrast  Result Date: 08/05/2017 CLINICAL DATA:  Acute onset of right lower quadrant abdominal pain. EXAM: CT ABDOMEN AND PELVIS WITH CONTRAST TECHNIQUE: Multidetector CT imaging of the abdomen and pelvis was performed using the standard protocol following bolus administration of intravenous contrast. CONTRAST:  163m ISOVUE-300 IOPAMIDOL (ISOVUE-300) INJECTION 61% COMPARISON:  CT of the abdomen and pelvis from 08/30/2011 FINDINGS: Lower chest: The visualized lung bases are grossly clear. The visualized portions of the mediastinum are unremarkable. Hepatobiliary: The liver is unremarkable in appearance. The gallbladder is unremarkable in appearance. The common bile duct remains normal in caliber. Pancreas: The pancreas is within normal limits. Spleen: The spleen is unremarkable in appearance. Adrenals/Urinary Tract: The adrenal glands are unremarkable in appearance. The kidneys are within normal limits. There is no evidence of hydronephrosis.  No renal or ureteral stones are identified. No perinephric stranding is seen. Stomach/Bowel: The stomach is unremarkable in appearance. The small bowel is within normal limits. The appendix is normal in caliber, seen adjacent to the inferior tip of the liver, without evidence of appendicitis. Mild scattered diverticulosis is noted along the distal descending and proximal sigmoid colon, without evidence of diverticulitis. Vascular/Lymphatic: The abdominal aorta is unremarkable in appearance. The inferior vena cava is grossly unremarkable. No retroperitoneal lymphadenopathy is seen. No pelvic sidewall lymphadenopathy is identified. Reproductive: The bladder is mildly distended and grossly unremarkable. Small calcified uterine fibroids are noted. The uterus is otherwise unremarkable. The ovaries are relatively symmetric. No suspicious adnexal masses are seen. Other: No additional soft tissue abnormalities are seen. Musculoskeletal: No acute osseous abnormalities are identified. The visualized musculature is unremarkable in appearance. IMPRESSION: 1. No acute abnormality seen within the abdomen or pelvis. 2. Mild scattered diverticulosis along the distal descending and proximal sigmoid colon, without evidence of diverticulitis. 3. Small calcified uterine fibroids noted. Electronically Signed   By: JGarald BaldingM.D.   On: 08/05/2017 22:47     Assessment & Plan:  Plan  I have discontinued WLedonnaB. Stickles's fluconazole and amoxicillin-clavulanate. I am also having her maintain her acetaminophen, ONETOUCH DELICA LANCETS FINE, vitamin B-12, Ginkgo Biloba Extract, (Flaxseed, Linseed, (FLAXSEED OIL PO)), Fish Oil, Vitamin D3, aspirin EC, fluticasone, cyclobenzaprine, levothyroxine, methocarbamol, ibuprofen, citalopram, losartan, glucose blood, diclofenac, blood glucose meter kit and supplies, SitaGLIPtin-MetFORMIN HCl, simvastatin, pantoprazole, fluticasone, ALPRAZolam, ondansetron, and losartan.  No orders of the  defined types were placed in this encounter.   Problem List Items Addressed This Visit      Unprioritized   Knee pain, right - Primary    Completely resolved rto prn          Follow-up: Return if symptoms worsen or fail to improve.  YAnn Held DO

## 2018-01-30 DIAGNOSIS — H40013 Open angle with borderline findings, low risk, bilateral: Secondary | ICD-10-CM | POA: Diagnosis not present

## 2018-01-30 DIAGNOSIS — E119 Type 2 diabetes mellitus without complications: Secondary | ICD-10-CM | POA: Diagnosis not present

## 2018-02-03 ENCOUNTER — Other Ambulatory Visit: Payer: Self-pay | Admitting: Family Medicine

## 2018-02-03 DIAGNOSIS — E785 Hyperlipidemia, unspecified: Secondary | ICD-10-CM

## 2018-03-17 ENCOUNTER — Ambulatory Visit: Payer: BLUE CROSS/BLUE SHIELD | Admitting: Family Medicine

## 2018-03-18 ENCOUNTER — Telehealth: Payer: Self-pay | Admitting: Family Medicine

## 2018-03-18 ENCOUNTER — Ambulatory Visit (HOSPITAL_BASED_OUTPATIENT_CLINIC_OR_DEPARTMENT_OTHER)
Admission: RE | Admit: 2018-03-18 | Discharge: 2018-03-18 | Disposition: A | Discharge status: Home / Self Care | Payer: Self-pay | Source: Ambulatory Visit | Attending: Family Medicine | Admitting: Family Medicine

## 2018-03-18 ENCOUNTER — Encounter: Payer: Self-pay | Admitting: Family Medicine

## 2018-03-18 ENCOUNTER — Ambulatory Visit (INDEPENDENT_AMBULATORY_CARE_PROVIDER_SITE_OTHER): Payer: Self-pay | Admitting: Family Medicine

## 2018-03-18 VITALS — BP 136/80 | HR 81 | Temp 98.3°F | Resp 16 | Ht 67.0 in | Wt 242.0 lb

## 2018-03-18 DIAGNOSIS — R05 Cough: Secondary | ICD-10-CM

## 2018-03-18 DIAGNOSIS — E1151 Type 2 diabetes mellitus with diabetic peripheral angiopathy without gangrene: Secondary | ICD-10-CM

## 2018-03-18 DIAGNOSIS — R079 Chest pain, unspecified: Secondary | ICD-10-CM | POA: Insufficient documentation

## 2018-03-18 DIAGNOSIS — IMO0002 Reserved for concepts with insufficient information to code with codable children: Secondary | ICD-10-CM

## 2018-03-18 DIAGNOSIS — E785 Hyperlipidemia, unspecified: Secondary | ICD-10-CM

## 2018-03-18 DIAGNOSIS — M791 Myalgia, unspecified site: Secondary | ICD-10-CM | POA: Insufficient documentation

## 2018-03-18 DIAGNOSIS — E1165 Type 2 diabetes mellitus with hyperglycemia: Secondary | ICD-10-CM

## 2018-03-18 DIAGNOSIS — R059 Cough, unspecified: Secondary | ICD-10-CM

## 2018-03-18 DIAGNOSIS — E1169 Type 2 diabetes mellitus with other specified complication: Secondary | ICD-10-CM | POA: Insufficient documentation

## 2018-03-18 DIAGNOSIS — I10 Essential (primary) hypertension: Secondary | ICD-10-CM

## 2018-03-18 LAB — POC INFLUENZA A&B (BINAX/QUICKVUE)
INFLUENZA A, POC: NEGATIVE
Influenza B, POC: NEGATIVE

## 2018-03-18 MED ORDER — AZITHROMYCIN 250 MG PO TABS: ORAL_TABLET | ORAL | 0 refills | Status: DC

## 2018-03-18 MED ORDER — TRAMADOL HCL 50 MG PO TABS: 50.0000 mg | ORAL_TABLET | Freq: Three times a day (TID) | ORAL | 0 refills | Status: DC | PRN

## 2018-03-18 NOTE — Progress Notes (Signed)
Patient ID: Felicia Solis, female    DOB: [birth year]  Age: 52 y.o. MRN: [id number]    Subjective:  Subjective  HPI Felicia Solis presents for body aches and cough x 3 weeks   No fever/chills, no nasal congestion , no headaches Review of Systems  Constitutional: Negative for chills and fever.  HENT: Negative for congestion and hearing loss.   Eyes: Negative for discharge.  Respiratory: Positive for cough and chest tightness. Negative for shortness of breath.   Cardiovascular: Positive for chest pain. Negative for palpitations and leg swelling.  Gastrointestinal: Negative for abdominal pain, blood in stool, constipation, diarrhea, nausea and vomiting.  Genitourinary: Negative for dysuria, frequency, hematuria and urgency.  Musculoskeletal: Negative for back pain and myalgias.  Skin: Negative for rash.  Allergic/Immunologic: Negative for environmental allergies.  Neurological: Negative for dizziness, weakness and headaches.  Hematological: Does not bruise/bleed easily.  Psychiatric/Behavioral: Negative for suicidal ideas. The patient is not nervous/anxious.     History Past Medical History:  Diagnosis Date  . Anemia    anemia  when pregnant in 2002  . Anxiety   . Arthritis    "knees and back" (09/28/2016)  . Asthma 5 yrs ago    told she had asthma.. does not remember doctors name.   . Chronic lower back pain   . Depression    takes citalopram  . History of hiatal hernia   . History of kidney stones   . Hyperlipidemia   . Hypothyroidism   . LBBB (left bundle branch block)    rate-related; identified on stress test 03/21/16  . Migraine    "weekly" (09/28/2016)  . 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 Shortness of breath    with exertion  . Thyroid disease   . Type II diabetes mellitus ([place]) dx'd 2016    She has a past surgical history that includes Endometrial ablation; Tubal ligation; laparoscopy; Dilation and curettage  of uterus (1999); Thyroidectomy (09/28/2011); Thyroidectomy (09/28/2016); and Thyroidectomy (Left, 09/28/2016).   Her family history includes Breast cancer in her maternal aunt; Colon cancer in her paternal grandfather; Colon polyps in her father; Diabetes in her brother, mother, and sister; Gallbladder disease in her maternal grandmother and mother; Heart attack in her brother; Heart disease in her mother and other; Hyperlipidemia in her father and mother; Hypertension in her father and mother; Ovarian cancer in her maternal grandmother.She reports that she has never smoked. She has never used smokeless tobacco. She reports that she does not drink alcohol or use drugs.  Current Outpatient Medications on File Prior to Visit  Medication Sig Dispense Refill  . acetaminophen (TYLENOL) 500 MG tablet Take 1,000 mg by mouth every 6 (six) hours as needed for mild pain.    Marland Kitchen ALPRAZolam (XANAX) 0.5 MG tablet Take 1 tablet (0.5 mg total) by mouth 3 (three) times daily as needed for anxiety. 10 tablet 0  . aspirin EC 81 MG tablet Take 1 tablet (81 mg total) by mouth daily. 90 tablet 3  . blood glucose meter kit and supplies KIT Dispense based on patient and insurance preference. Use up to four times daily as directed. (FOR ICD-9 250.00, 250.01). 1 each 0  . Cholecalciferol (VITAMIN D3) 2000 units TABS Take 2,000 Units by mouth daily.     . citalopram (CELEXA) 20 MG tablet Take 1 tablet (20 mg total) by mouth daily. 90 tablet 3  . cyanocobalamin  500 MCG tablet Take 500 mcg by mouth daily.    . cyclobenzaprine (FLEXERIL) 10 MG tablet Take 1 tablet (10 mg total) by mouth at bedtime. (Patient taking differently: Take 10 mg by mouth daily as needed for muscle spasms. ) 10 tablet 0  . diclofenac (VOLTAREN) 75 MG EC tablet   0  . Flaxseed, Linseed, (FLAXSEED OIL PO) Take 1,300 mg by mouth daily.    . fluticasone (FLONASE) 50 MCG/ACT nasal spray Place 2 sprays into both nostrils daily. (Patient taking differently: Place 2  sprays into both nostrils daily as needed for allergies. ) 16 g 1  . fluticasone (FLONASE) 50 MCG/ACT nasal spray Place 2 sprays into both nostrils daily. 16 g 6  . Ginkgo Biloba Extract 60 MG CAPS Take 60 mg by mouth daily.    Marland Kitchen glucose blood (ONETOUCH VERIO) test strip Onetouch Verio Check blood sugar twice daily 100 each 12  . ibuprofen (ADVIL,MOTRIN) 800 MG tablet TK 1 T PO  Q 6-8 H PRF PAIN  0  . levothyroxine (SYNTHROID) 100 MCG tablet Take 1 tablet (100 mcg total) by mouth daily. 30 tablet 6  . losartan (COZAAR) 25 MG tablet Take 1 tablet (25 mg total) by mouth daily. 90 tablet 1  . losartan (COZAAR) 25 MG tablet TAKE 1 TABLET BY MOUTH ONCE DAILY 90 tablet 1  . methocarbamol (ROBAXIN) 500 MG tablet Take 1 tablet (500 mg total) by mouth 2 (two) times daily. 20 tablet 0  . Omega-3 Fatty Acids (FISH OIL) 1200 MG CAPS Take 1,200 mg by mouth daily.    . ondansetron (ZOFRAN) 8 MG tablet Take 1 tablet (8 mg total) by mouth every 8 (eight) hours as needed for nausea or vomiting. 20 tablet 0  . ONETOUCH DELICA LANCETS FINE MISC Check blood sugar twice daily 100 each 12  . pantoprazole (PROTONIX) 40 MG tablet Take 1 tablet (40 mg total) by mouth daily. 90 tablet 3  . rosuvastatin (CRESTOR) 20 MG tablet TAKE 1 TABLET BY MOUTH ONCE DAILY 90 tablet 1  . simvastatin (ZOCOR) 40 MG tablet Take 1 tablet (40 mg total) by mouth at bedtime. 90 tablet 1  . SitaGLIPtin-MetFORMIN HCl (JANUMET XR) [phone] MG TB24 TAKE 1 TABLET BY MOUTH ONCE DAILY 90 tablet 1   No current facility-administered medications on file prior to visit.      Objective:  Objective  Physical Exam  Constitutional: She is oriented to person, place, and time. She appears well-developed and well-nourished.  HENT:  Head: Normocephalic and atraumatic.  Eyes: Conjunctivae and EOM are normal.  Neck: Normal range of motion. Neck supple. No JVD present. Carotid bruit is not present. No thyromegaly present.  Cardiovascular: Normal rate,  regular rhythm and normal heart sounds.  No murmur heard. Pulmonary/Chest: Effort normal and breath sounds normal. No respiratory distress. She has no wheezes. She has no rales. She exhibits no tenderness.  Musculoskeletal: She exhibits no edema.  Neurological: She is alert and oriented to person, place, and time.  Psychiatric: She has a normal mood and affect.  Nursing note and vitals reviewed.  BP 136/80 (BP Location: Right Arm, Cuff Size: Large)   Pulse 81   Temp 98.3 F (36.8 C) (Oral)   Resp 16   Ht _0  (1.702 m)   Wt 242 lb (109.8 kg)   SpO2 98%   BMI 37.90 kg/m  Wt Readings from Last 3 Encounters:  03/18/18 242 lb (109.8 kg)  01/09/18 239 lb 3.2 oz (108.5 kg)  12/02/17 228 lb 9.6 oz (103.7 kg)     Lab Results  Component Value Date   WBC 8.4 12/02/2017   HGB 12.9 12/02/2017   HCT 39.3 12/02/2017   PLT 276.0 12/02/2017   GLUCOSE 149 (H) 12/02/2017   CHOL 103 12/02/2017   TRIG 116.0 12/02/2017   HDL 29.90 (L) 12/02/2017   LDLCALC 50 12/02/2017   ALT 21 12/02/2017   AST 17 12/02/2017   NA 142 12/02/2017   K 4.3 12/02/2017   CL 104 12/02/2017   CREATININE 0.57 12/02/2017   BUN 10 12/02/2017   CO2 30 12/02/2017   TSH 0.65 09/17/2017   HGBA1C 6.9 (H) 09/17/2017   MICROALBUR 2.5 (H) 04/04/2017    Ct Abdomen Pelvis W Contrast  Result Date: 08/05/2017 CLINICAL DATA:  Acute onset of right lower quadrant abdominal pain. EXAM: CT ABDOMEN AND PELVIS WITH CONTRAST TECHNIQUE: Multidetector CT imaging of the abdomen and pelvis was performed using the standard protocol following bolus administration of intravenous contrast. CONTRAST:  14m ISOVUE-300 IOPAMIDOL (ISOVUE-300) INJECTION 61% COMPARISON:  CT of the abdomen and pelvis from 08/30/2011 FINDINGS: Lower chest: The visualized lung bases are grossly clear. The visualized portions of the mediastinum are unremarkable. Hepatobiliary: The liver is unremarkable in appearance. The gallbladder is unremarkable in appearance.  The common bile duct remains normal in caliber. Pancreas: The pancreas is within normal limits. Spleen: The spleen is unremarkable in appearance. Adrenals/Urinary Tract: The adrenal glands are unremarkable in appearance. The kidneys are within normal limits. There is no evidence of hydronephrosis. No renal or ureteral stones are identified. No perinephric stranding is seen. Stomach/Bowel: The stomach is unremarkable in appearance. The small bowel is within normal limits. The appendix is normal in caliber, seen adjacent to the inferior tip of the liver, without evidence of appendicitis. Mild scattered diverticulosis is noted along the distal descending and proximal sigmoid colon, without evidence of diverticulitis. Vascular/Lymphatic: The abdominal aorta is unremarkable in appearance. The inferior vena cava is grossly unremarkable. No retroperitoneal lymphadenopathy is seen. No pelvic sidewall lymphadenopathy is identified. Reproductive: The bladder is mildly distended and grossly unremarkable. Small calcified uterine fibroids are noted. The uterus is otherwise unremarkable. The ovaries are relatively symmetric. No suspicious adnexal masses are seen. Other: No additional soft tissue abnormalities are seen. Musculoskeletal: No acute osseous abnormalities are identified. The visualized musculature is unremarkable in appearance. IMPRESSION: 1. No acute abnormality seen within the abdomen or pelvis. 2. Mild scattered diverticulosis along the distal descending and proximal sigmoid colon, without evidence of diverticulitis. 3. Small calcified uterine fibroids noted. Electronically Signed   By: JGarald BaldingM.D.   On: 08/05/2017 22:47     Assessment & Plan:  Plan  I am having Felicia Solis start on azithromycin and traMADol. I am also having her maintain her acetaminophen, ONETOUCH DELICA LANCETS FINE, vitamin B-12, Ginkgo Biloba Extract, (Flaxseed, Linseed, (FLAXSEED OIL PO)), Fish Oil, Vitamin D3, aspirin EC,  fluticasone, cyclobenzaprine, levothyroxine, methocarbamol, ibuprofen, citalopram, losartan, glucose blood, diclofenac, blood glucose meter kit and supplies, SitaGLIPtin-MetFORMIN HCl, simvastatin, pantoprazole, fluticasone, ALPRAZolam, ondansetron, losartan, and rosuvastatin.  Meds ordered this encounter  Medications  . azithromycin (ZITHROMAX Z-PAK) 250 MG tablet    Sig: As directed    Dispense:  6 each    Refill:  0  . traMADol (ULTRAM) 50 MG tablet    Sig: Take 1 tablet (50 mg total) by mouth every 8 (eight) hours as needed.    Dispense:  20 tablet    Refill:  0    Problem List Items Addressed This Visit      Unprioritized   Chest pain   Relevant Orders   EKG 12-Lead (Completed)   DG Chest 2 View (Completed)   Cough    X 3 weeks  Check labs and cxr  z pack       Relevant Medications   azithromycin (ZITHROMAX Z-PAK) 250 MG tablet   DM (diabetes mellitus) type II uncontrolled, periph vascular disorder (HCC)   Relevant Orders   Hemoglobin A1c   Lipid panel   Essential hypertension    Well controlled, no changes to meds. Encouraged heart healthy diet such as the DASH diet and exercise as tolerated.       Hyperlipidemia associated with type 2 diabetes mellitus ([place])    Tolerating statin, encouraged heart healthy diet, avoid trans fats, minimize simple carbs and saturated fats. Increase exercise as tolerated      Relevant Orders   Hemoglobin A1c   Lipid panel   Myalgia - Primary    prob due to viral uri  ----r/o pneumonia due to cough      Relevant Medications   traMADol (ULTRAM) 50 MG tablet   Other Relevant Orders   CBC with Differential/Platelet   Comprehensive metabolic panel   TSH   Vitamin D 1,25 dihydroxy   Antinuclear Antib (ANA)   Rheumatoid Factor   POC Influenza A&B ([place] test) (Completed)      Follow-up: Return in about 3 months (around 06/17/2018), or if symptoms worsen or fail to improve, for hypertension, hyperlipidemia, diabetes  II.  [name], DO

## 2018-03-18 NOTE — Assessment & Plan Note (Signed)
prob due to viral uri  ----r/o pneumonia due to cough

## 2018-03-18 NOTE — Telephone Encounter (Signed)
Copied from [place] [phone]. Topic: Quick Communication - See Telephone Encounter >> Mar 18, 2018  3:59 PM Genella Rife H wrote: CRM for notification. See Telephone encounter for: 03/18/18.  Called pt to let her know her insurance did not verify and we need updated insurance information. Let her know today would be billed as a self pay until we get updated insurance information.

## 2018-03-18 NOTE — Patient Instructions (Signed)
Chest Wall Pain °Chest wall pain is pain in or around the bones and muscles of your chest. Sometimes, an injury causes this pain. Sometimes, the cause may not be known. This pain may take several weeks or longer to get better. °Follow these instructions at home: °Pay attention to any changes in your symptoms. Take these actions to help with your pain: °· Rest as told by your health care provider. °· Avoid activities that cause pain. These include any activities that use your chest muscles or your abdominal and side muscles to lift heavy items. °· If directed, apply ice to the painful area: °? Put ice in a plastic bag. °? Place a towel between your skin and the bag. °? Leave the ice on for 20 minutes, 2-3 times per day. °· Take over-the-counter and prescription medicines only as told by your health care provider. °· Do not use tobacco products, including cigarettes, chewing tobacco, and e-cigarettes. If you need help quitting, ask your health care provider. °· Keep all follow-up visits as told by your health care provider. This is important. ° °Contact a health care provider if: °· You have a fever. °· Your chest pain becomes worse. °· You have new symptoms. °Get help right away if: °· You have nausea or vomiting. °· You feel sweaty or light-headed. °· You have a cough with phlegm (sputum) or you cough up blood. °· You develop shortness of breath. °This information is not intended to replace advice given to you by your health care provider. Make sure you discuss any questions you have with your health care provider. °Document Released: 06/11/2005 Document Revised: 10/20/2015 Document Reviewed: 09/06/2014 °Elsevier Interactive Patient Education © 2018 [employer] ° °

## 2018-03-18 NOTE — Assessment & Plan Note (Signed)
X 3 weeks  Check labs and cxr  z pack

## 2018-03-18 NOTE — Assessment & Plan Note (Signed)
Well controlled, no changes to meds. Encouraged heart healthy diet such as the DASH diet and exercise as tolerated.  °

## 2018-03-18 NOTE — Assessment & Plan Note (Signed)
Tolerating statin, encouraged heart healthy diet, avoid trans fats, minimize simple carbs and saturated fats. Increase exercise as tolerated 

## 2018-03-19 LAB — COMPREHENSIVE METABOLIC PANEL
ALT: 26 U/L (ref 0–35)
AST: 27 U/L (ref 0–37)
Albumin: 4.4 g/dL (ref 3.5–5.2)
Alkaline Phosphatase: 40 U/L (ref 39–117)
BUN: 11 mg/dL (ref 6–23)
CO2: 30 meq/L (ref 19–32)
CREATININE: 0.55 mg/dL (ref 0.40–1.20)
Calcium: 9.2 mg/dL (ref 8.4–10.5)
Chloride: 103 mEq/L (ref 96–112)
GFR: 148.45 mL/min (ref >60.00)
GLUCOSE: 262 mg/dL — AB (ref 70–99)
Potassium: 3.6 mEq/L (ref 3.5–5.1)
SODIUM: 142 meq/L (ref 135–145)
Total Bilirubin: 0.5 mg/dL (ref 0.2–1.2)
Total Protein: 6.9 g/dL (ref 6.0–8.3)

## 2018-03-19 LAB — LIPID PANEL
CHOLESTEROL: 79 mg/dL (ref 0–200)
HDL: 29.7 mg/dL — ABNORMAL LOW (ref >39.00)
LDL CALC: 22 mg/dL (ref 0–99)
NonHDL: 49.2
TRIGLYCERIDES: 136 mg/dL (ref 0.0–149.0)
Total CHOL/HDL Ratio: 3
VLDL: 27.2 mg/dL (ref 0.0–40.0)

## 2018-03-19 LAB — HEMOGLOBIN A1C: Hgb A1c MFr Bld: 8.3 % — ABNORMAL HIGH (ref 4.6–6.5)

## 2018-03-19 LAB — CBC WITH DIFFERENTIAL/PLATELET
BASOS ABS: 0.1 10*3/uL (ref 0.0–0.1)
Basophils Relative: 1 % (ref 0.0–3.0)
EOS ABS: 0.2 10*3/uL (ref 0.0–0.7)
Eosinophils Relative: 1.6 % (ref 0.0–5.0)
HEMATOCRIT: 37.1 % (ref 36.0–46.0)
HEMOGLOBIN: 12.3 g/dL (ref 12.0–15.0)
LYMPHS PCT: 39.5 % (ref 12.0–46.0)
Lymphs Abs: 3.9 10*3/uL (ref 0.7–4.0)
MCHC: 33.1 g/dL (ref 30.0–36.0)
MCV: 84.9 fl (ref 78.0–100.0)
MONOS PCT: 6.4 % (ref 3.0–12.0)
Monocytes Absolute: 0.6 10*3/uL (ref 0.1–1.0)
NEUTROS ABS: 5 10*3/uL (ref 1.4–7.7)
Neutrophils Relative %: 51.5 % (ref 43.0–77.0)
PLATELETS: 282 10*3/uL (ref 150.0–400.0)
RBC: 4.37 Mil/uL (ref 3.87–5.11)
RDW: 13.9 % (ref 11.5–15.5)
WBC: 9.8 10*3/uL (ref 4.0–10.5)

## 2018-03-19 LAB — TSH: TSH: 2.19 u[IU]/mL (ref 0.35–4.50)

## 2018-03-21 LAB — VITAMIN D 1,25 DIHYDROXY
VITAMIN D3 1, 25 (OH): 60 pg/mL
Vitamin D 1, 25 (OH)2 Total: 60 pg/mL (ref 18–72)
Vitamin D2 1, 25 (OH)2: <8 pg/mL

## 2018-03-21 LAB — RHEUMATOID FACTOR: Rheumatoid fact SerPl-aCnc: <14 IU/mL (ref <14)

## 2018-03-21 LAB — ANA: ANA: NEGATIVE

## 2018-03-27 ENCOUNTER — Telehealth: Payer: Self-pay | Admitting: *Deleted

## 2018-03-27 MED ORDER — EMPAGLIFLOZIN 10 MG PO TABS: 10.0000 mg | ORAL_TABLET | Freq: Every day | ORAL | 2 refills | Status: DC

## 2018-03-27 NOTE — Addendum Note (Signed)
Addended byDamita Dunnings D on: 03/27/2018 02:25 PM   Modules accepted: Orders

## 2018-03-27 NOTE — Telephone Encounter (Signed)
See lab note.  

## 2018-03-27 NOTE — Telephone Encounter (Signed)
Copied from [place] [id number]. Topic: General - Other >> Mar 27, 2018  8:45 AM Ivar Drape wrote: Reason for CRM:  Patient would like the results of her last labs

## 2018-04-02 ENCOUNTER — Telehealth: Payer: Self-pay | Admitting: Family Medicine

## 2018-04-02 NOTE — Telephone Encounter (Signed)
It says in her chart she has [employer]---  She has no ins now? Is she taking any meds?   I'm pretty sure we have coupons in the office but don't know how cheap it will be if she has no ins May need to use amaryl ---  But I prefer not to use that one unless we have to

## 2018-04-02 NOTE — Telephone Encounter (Signed)
Copied from [place] [id number]. Topic: Quick Communication - Rx Refill/Question >> Apr 02, 2018  8:59 AM Waylan Rocher, Lumin L wrote: Medication: empagliflozin (JARDIANCE) 10 MG TABS tablet (no insurance and its $500, would like lesser expensive alternate or a coupon)  Has the patient contacted their pharmacy? Yes.   (Agent: If no, request that the patient contact the pharmacy for the refill.) (Agent: If yes, when and what did the pharmacy advise?)  Preferred Pharmacy (with phone number or street name): [facility], [place] [address] Phone: [phone] Fax: [phone]  Agent: Please be advised that RX refills may take up to 3 business days. We ask that you follow-up with your pharmacy.

## 2018-04-02 NOTE — Telephone Encounter (Signed)
Can we change to something else?

## 2018-04-03 NOTE — Telephone Encounter (Signed)
Left message on machine to call back to let us know if she has prescription insurance.

## 2018-04-04 ENCOUNTER — Ambulatory Visit: Payer: BLUE CROSS/BLUE SHIELD | Admitting: Family Medicine

## 2018-04-04 MED ORDER — GLIMEPIRIDE 2 MG PO TABS: 2.0000 mg | ORAL_TABLET | Freq: Every day | ORAL | 3 refills | Status: DC

## 2018-04-04 NOTE — Telephone Encounter (Signed)
Patient notified and medication was sent in.

## 2018-04-04 NOTE — Telephone Encounter (Signed)
Patient called back and does not have rx insurance.

## 2018-04-04 NOTE — Telephone Encounter (Signed)
amaryl 2 mg #30  1 po qd,  2 refills

## 2018-04-23 ENCOUNTER — Other Ambulatory Visit: Payer: Self-pay | Admitting: Family Medicine

## 2018-04-23 DIAGNOSIS — Z1231 Encounter for screening mammogram for malignant neoplasm of breast: Secondary | ICD-10-CM

## 2018-05-14 ENCOUNTER — Other Ambulatory Visit: Payer: Self-pay

## 2018-05-14 ENCOUNTER — Emergency Department (HOSPITAL_BASED_OUTPATIENT_CLINIC_OR_DEPARTMENT_OTHER)
Admission: EM | Admit: 2018-05-14 | Discharge: 2018-05-14 | Disposition: A | Discharge status: Home / Self Care | Payer: Self-pay | Attending: Emergency Medicine | Admitting: Emergency Medicine

## 2018-05-14 ENCOUNTER — Encounter (HOSPITAL_BASED_OUTPATIENT_CLINIC_OR_DEPARTMENT_OTHER): Payer: Self-pay | Admitting: *Deleted

## 2018-05-14 ENCOUNTER — Emergency Department (HOSPITAL_BASED_OUTPATIENT_CLINIC_OR_DEPARTMENT_OTHER): Payer: Self-pay

## 2018-05-14 DIAGNOSIS — I10 Essential (primary) hypertension: Secondary | ICD-10-CM | POA: Insufficient documentation

## 2018-05-14 DIAGNOSIS — J45909 Unspecified asthma, uncomplicated: Secondary | ICD-10-CM | POA: Insufficient documentation

## 2018-05-14 DIAGNOSIS — E039 Hypothyroidism, unspecified: Secondary | ICD-10-CM | POA: Insufficient documentation

## 2018-05-14 DIAGNOSIS — E119 Type 2 diabetes mellitus without complications: Secondary | ICD-10-CM | POA: Insufficient documentation

## 2018-05-14 DIAGNOSIS — Z79899 Other long term (current) drug therapy: Secondary | ICD-10-CM | POA: Insufficient documentation

## 2018-05-14 DIAGNOSIS — Z7984 Long term (current) use of oral hypoglycemic drugs: Secondary | ICD-10-CM | POA: Insufficient documentation

## 2018-05-14 DIAGNOSIS — R0789 Other chest pain: Secondary | ICD-10-CM | POA: Insufficient documentation

## 2018-05-14 DIAGNOSIS — Z7982 Long term (current) use of aspirin: Secondary | ICD-10-CM | POA: Insufficient documentation

## 2018-05-14 LAB — CBC WITH DIFFERENTIAL/PLATELET
ABS IMMATURE GRANULOCYTES: 0.01 10*3/uL (ref 0.00–0.07)
BASOS ABS: 0 10*3/uL (ref 0.0–0.1)
Basophils Relative: 0 %
Eosinophils Absolute: 0.1 10*3/uL (ref 0.0–0.5)
Eosinophils Relative: 2 %
HEMATOCRIT: 40.5 % (ref 36.0–46.0)
Hemoglobin: 12.6 g/dL (ref 12.0–15.0)
IMMATURE GRANULOCYTES: 0 %
LYMPHS ABS: 3.2 10*3/uL (ref 0.7–4.0)
Lymphocytes Relative: 44 %
MCH: 26.9 pg (ref 26.0–34.0)
MCHC: 31.1 g/dL (ref 30.0–36.0)
MCV: 86.5 fL (ref 80.0–100.0)
Monocytes Absolute: 0.6 10*3/uL (ref 0.1–1.0)
Monocytes Relative: 8 %
NEUTROS PCT: 46 %
NRBC: 0 % (ref 0.0–0.2)
Neutro Abs: 3.3 10*3/uL (ref 1.7–7.7)
Platelets: 295 10*3/uL (ref 150–400)
RBC: 4.68 MIL/uL (ref 3.87–5.11)
RDW: 12.9 % (ref 11.5–15.5)
WBC: 7.3 10*3/uL (ref 4.0–10.5)

## 2018-05-14 LAB — COMPREHENSIVE METABOLIC PANEL
ALBUMIN: 4.3 g/dL (ref 3.5–5.0)
ALK PHOS: 37 U/L — AB (ref 38–126)
ALT: 37 U/L (ref 0–44)
AST: 48 U/L — AB (ref 15–41)
Anion gap: 9 (ref 5–15)
BILIRUBIN TOTAL: 0.7 mg/dL (ref 0.3–1.2)
BUN: 7 mg/dL (ref 6–20)
CO2: 27 mmol/L (ref 22–32)
CREATININE: 0.49 mg/dL (ref 0.44–1.00)
Calcium: 8.9 mg/dL (ref 8.9–10.3)
Chloride: 103 mmol/L (ref 98–111)
GFR calc Af Amer: >60 mL/min (ref >60)
GFR calc non Af Amer: >60 mL/min (ref >60)
Glucose, Bld: 178 mg/dL — ABNORMAL HIGH (ref 70–99)
POTASSIUM: 3.3 mmol/L — AB (ref 3.5–5.1)
Sodium: 139 mmol/L (ref 135–145)
Total Protein: 7.5 g/dL (ref 6.5–8.1)

## 2018-05-14 LAB — TROPONIN I
TROPONIN I: 0.03 ng/mL — AB (ref <0.03)
Troponin I: 0.03 ng/mL (ref <0.03)

## 2018-05-14 MED ORDER — IBUPROFEN 600 MG PO TABS: 600.0000 mg | ORAL_TABLET | Freq: Four times a day (QID) | ORAL | 0 refills | Status: DC | PRN

## 2018-05-14 MED ORDER — ASPIRIN EC 325 MG PO TBEC
325.0000 mg | DELAYED_RELEASE_TABLET | Freq: Once | ORAL | Status: AC
  Administered 2018-05-14: 325 mg via ORAL
  Filled 2018-05-14: qty 1

## 2018-05-14 NOTE — ED Provider Notes (Addendum)
[facility] EMERGENCY DEPARTMENT Provider Note   CSN: [id number] Arrival date & time: 05/14/18  1349     History   Chief Complaint Chief Complaint  Patient presents with  . Chest Pain    HPI Felicia Solis is a 52 y.o. female w PMHx asthma, T2DM, LBBB, HLD, HTN, presenting to the ED with complaint of acute onset of midsternal throbbing chest pain that began about 45 minutes prior to arrival. She states she was getting her clothes laid out prior to a shower and began having the pain. Pain radiated to her back and into her bilateral neck. Pain improved without intervention and then returned again. It is currently mild now in the ED. Denies nausea, diaphoresis, SOB, cough. No cardiac hx. No hx blood clot. No recent travel, surgery. No exogenous estrogen use, hemoptysis or unilateral leg pain or swelling.  Per chart review, she had a normal cardiac stress test and echo in 2017.    Past Medical History:  Diagnosis Date  . Anemia    anemia  when pregnant in 2002  . Anxiety   . Arthritis    "knees and back" (09/28/2016)  . Asthma 5 yrs ago    told she had asthma.. does not remember doctors name.   . Chronic lower back pain   . Depression    takes citalopram  . History of hiatal hernia   . History of kidney stones   . Hyperlipidemia   . Hypothyroidism   . LBBB (left bundle branch block)    rate-related; identified on stress test 03/21/16  . Migraine    "weekly" (09/28/2016)  . 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 Shortness of breath    with exertion  . Thyroid disease   . Type II diabetes mellitus ([place]) dx'd 2016    Patient Active Problem List   Diagnosis Date Noted  . Myalgia 03/18/2018  . Hyperlipidemia associated with type 2 diabetes mellitus ([place]) 03/18/2018  . Nausea and vomiting 12/02/2017  . Hematuria 12/02/2017  . Pansinusitis 11/19/2017  . 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 Preventative  health care 09/27/2017  . Hyperlipidemia LDL goal <100 09/18/2017  . Vitamin D deficiency 09/18/2017  . Hypothyroidism 09/18/2017  . Other intervertebral disc degeneration, lumbar region 04/07/2017  . Left hip pain 04/07/2017  . Hyperlipidemia LDL goal <70 04/07/2017  . Essential hypertension 04/07/2017  . Acute vaginitis 04/07/2017  . S/P complete thyroidectomy 09/28/2016  . Memory loss 08/23/2016  . Thyroid nodule 08/02/2016  . Plantar fasciitis of right foot 12/01/2015  . Knee pain, right 12/01/2015  . Headache, migraine 09/06/2015  . Bilateral knee pain 08/22/2015  . Thoracic myofascial strain 08/22/2015  . Dysphagia 04/10/2015  . Chest pain 04/10/2015  . Epigastric pain 04/10/2015  . Functional diarrhea 04/10/2015  . NSAID long-term use 04/10/2015  . Midepigastric pain 03/22/2015  . Cough 01/07/2015  . Allergic rhinitis 12/13/2014  . Acute bronchitis 12/13/2014  . Blood in stool 10/01/2014  . DM (diabetes mellitus) type II uncontrolled, periph vascular disorder ([place]) 08/13/2014  . Abnormal ECG 05/14/2012  . Breast pain 10/23/2011  . ASCUS on Pap smear 10/08/2011  . H/O thyroid nodule 08/27/2011  . Depression with anxiety 08/27/2011  . Heavy periods 08/27/2011    Past Surgical History:  Procedure Laterality Date  . DILATION AND CURETTAGE OF UTERUS  1999  . ENDOMETRIAL ABLATION    . LAPAROSCOPY  years ago ..pt does not know where surgery was done ... states" my stomach would blow up then go down"  . THYROIDECTOMY  09/28/2011   Procedure: THYROIDECTOMY;  Surgeon: Izora Gala, MD;  Location: [facility];  Service: ENT;  Laterality: Right;  RIGHT THYROIDECTOMY WITH FROZEN SECTION  . THYROIDECTOMY  09/28/2016   completion of thyroidectomy/notes 09/28/2016  . THYROIDECTOMY Left 09/28/2016   Procedure: COMPLETION OF THYROIDECTOMY;  Surgeon: Izora Gala, MD;  Location: [facility];  Service: ENT;  Laterality: Left;  . TUBAL LIGATION       OB History    Gravida  5   Para  3   Term   3   Preterm      AB  2   Living  3     SAB  2   TAB      Ectopic      Multiple      Live Births               Home Medications    Prior to Admission medications   Medication Sig Start Date End Date Taking? Authorizing Provider  acetaminophen (TYLENOL) 500 MG tablet Take 1,000 mg by mouth every 6 (six) hours as needed for mild pain.    [provider]  ALPRAZolam Duanne Moron) 0.5 MG tablet Take 1 tablet (0.5 mg total) by mouth 3 (three) times daily as needed for anxiety. 11/19/17   [name], DO  aspirin EC 81 MG tablet Take 1 tablet (81 mg total) by mouth daily. 03/07/16   End, Harrell Gave, MD  azithromycin (ZITHROMAX Z-PAK) 250 MG tablet As directed 03/18/18   Carollee Herter, Kendrick Fries R, DO  blood glucose meter kit and supplies KIT Dispense based on patient and insurance preference. Use up to four times daily as directed. (FOR ICD-9 250.00, 250.01). 09/17/17   Carollee Herter, Alferd Apa, DO  Cholecalciferol (VITAMIN D3) 2000 units TABS Take 2,000 Units by mouth daily.     [provider]  citalopram (CELEXA) 20 MG tablet Take 1 tablet (20 mg total) by mouth daily. 05/27/17   [name], DO  cyanocobalamin 500 MCG tablet Take 500 mcg by mouth daily.    [provider]  cyclobenzaprine (FLEXERIL) 10 MG tablet Take 1 tablet (10 mg total) by mouth at bedtime. Patient taking differently: Take 10 mg by mouth daily as needed for muscle spasms.  08/21/16   [name], DO  diclofenac (VOLTAREN) 75 MG EC tablet  08/05/17   [provider]  Flaxseed, Linseed, (FLAXSEED OIL PO) Take 1,300 mg by mouth daily.    [provider]  fluticasone (FLONASE) 50 MCG/ACT nasal spray Place 2 sprays into both nostrils daily. Patient taking differently: Place 2 sprays into both nostrils daily as needed for allergies.  05/28/16   [place], Percell Miller, PA-C  fluticasone (FLONASE) 50 MCG/ACT nasal spray Place 2 sprays into both nostrils daily. 11/19/17    [name], DO  Ginkgo Biloba Extract 60 MG CAPS Take 60 mg by mouth daily.    [provider]  glimepiride (AMARYL) 2 MG tablet Take 1 tablet (2 mg total) by mouth daily before breakfast. 04/04/18   Carollee Herter, Alferd Apa, DO  glucose blood (ONETOUCH VERIO) test strip [facility] Check blood sugar twice daily 05/27/17   Carollee Herter, Alferd Apa, DO  ibuprofen (ADVIL,MOTRIN) 800 MG tablet TK 1 T PO  Q 6-8 H PRF PAIN 01/21/17   [provider]  levothyroxine (SYNTHROID) 100 MCG tablet Take 1 tablet (100 mcg total) by mouth daily. 09/28/16   Izora Gala, MD  losartan (COZAAR) 25 MG tablet Take 1 tablet (25 mg total) by mouth daily. 05/27/17   Roma Schanz R, DO  losartan (COZAAR) 25 MG tablet TAKE 1 TABLET BY MOUTH ONCE DAILY 12/09/17   Carollee Herter, Alferd Apa, DO  methocarbamol (ROBAXIN) 500 MG tablet Take 1 tablet (500 mg total) by mouth 2 (two) times daily. 01/13/17   Larene Pickett, PA-C  Omega-3 Fatty Acids (FISH OIL) 1200 MG CAPS Take 1,200 mg by mouth daily.    [provider]  ondansetron (ZOFRAN) 8 MG tablet Take 1 tablet (8 mg total) by mouth every 8 (eight) hours as needed for nausea or vomiting. 12/02/17   Carollee Herter, Alferd Apa, DO  [facility] DELICA LANCETS FINE MISC Check blood sugar twice daily 08/26/14   Carollee Herter, Kendrick Fries R, DO  pantoprazole (PROTONIX) 40 MG tablet Take 1 tablet (40 mg total) by mouth daily. 09/27/17   Roma Schanz R, DO  rosuvastatin (CRESTOR) 20 MG tablet TAKE 1 TABLET BY MOUTH ONCE DAILY 02/04/18   Carollee Herter, Alferd Apa, DO  simvastatin (ZOCOR) 40 MG tablet Take 1 tablet (40 mg total) by mouth at bedtime. 09/27/17   Roma Schanz R, DO  SitaGLIPtin-MetFORMIN HCl (JANUMET XR) [phone] MG TB24 TAKE 1 TABLET BY MOUTH ONCE DAILY 09/27/17   Carollee Herter, Alferd Apa, DO  traMADol (ULTRAM) 50 MG tablet Take 1 tablet (50 mg total) by mouth every 8 (eight) hours as needed. 03/18/18   [name], DO    Family History Family  History  Problem Relation Age of Onset  . Diabetes Mother   . Hypertension Mother   . Hyperlipidemia Mother   . Gallbladder disease Mother   . Heart disease Mother   . Diabetes Sister   . Hypertension Father   . Hyperlipidemia Father   . Colon polyps Father   . Ovarian cancer Maternal Grandmother   . Gallbladder disease Maternal Grandmother   . Colon cancer Paternal Grandfather   . Breast cancer Maternal Aunt   . Heart disease Other        early cad  . Diabetes Brother   . Heart attack Brother   . Anesthesia problems Neg Hx   . Hypotension Neg Hx   . Malignant hyperthermia Neg Hx   . Pseudochol deficiency Neg Hx   . Esophageal cancer Neg Hx     Social History Social History   Tobacco Use  . Smoking status: Never Smoker  . Smokeless tobacco: Never Used  Substance Use Topics  . Alcohol use: No    Alcohol/week: 0.0 standard drinks  . Drug use: No     Allergies   Lisinopril   Review of Systems Review of Systems  Constitutional: Negative for diaphoresis.  Respiratory: Negative for cough and shortness of breath.   Cardiovascular: Positive for chest pain. Negative for leg swelling.  Gastrointestinal: Negative for nausea.  All other systems reviewed and are negative.    Physical Exam Updated Vital Signs BP 120/87 (BP Location: Right Arm)   Pulse 75   Temp 99.1 F (37.3 C) (Oral)   Resp 18   Ht '5\' 6"'$  (1.676 m)   Wt 108 kg   SpO2 100%   BMI 38.41 kg/m   Physical Exam  Constitutional: She appears well-developed and well-nourished. No distress.  HENT:  Head: Normocephalic and atraumatic.  Eyes: Conjunctivae are normal.  Neck: Normal range of motion. Neck supple. No JVD present. No tracheal deviation present.  Cardiovascular: Normal rate, regular rhythm and normal heart sounds.  Pulmonary/Chest: Effort normal and breath sounds normal. No respiratory distress. She exhibits tenderness (mild sternal chest tenderness).  Abdominal: Soft. Bowel sounds are  normal. She exhibits no mass. There is no tenderness. There is no guarding.  Musculoskeletal: She exhibits no edema.  Neurological: She is alert.  Skin: Skin is warm. She is not diaphoretic.  Psychiatric: She has a normal mood and affect. Her behavior is normal.  Nursing note and vitals reviewed.    ED Treatments / Results  Labs (all labs ordered are listed, but only abnormal results are displayed) Labs Reviewed  TROPONIN I - Abnormal; Notable for the following components:      Result Value   Troponin I 0.03 (*)    All other components within normal limits  COMPREHENSIVE METABOLIC PANEL - Abnormal; Notable for the following components:   Potassium 3.3 (*)    Glucose, Bld 178 (*)    AST 48 (*)    Alkaline Phosphatase 37 (*)    All other components within normal limits  TROPONIN I - Abnormal; Notable for the following components:   Troponin I 0.03 (*)    All other components within normal limits  CBC WITH DIFFERENTIAL/PLATELET    EKG Date/Time:  14-May-2018 13:57:48 Vent. rate 82 BPM PR interval * ms QRS duration 89 ms QT/QTc 384/449 ms P-R-T axes 54 59 6 Text Interpretation: Sinus rhythm, Confirmed by Veryl Speak [phone]) on 05/14/2018 7:08:27 PM  EKG Interpretation  Date/Time:  Wednesday May 14 2018 17:31:09 EST Ventricular Rate:  88 PR Interval:    QRS Duration: 95 QT Interval:  429 QTC Calculation: 520 R Axis:   72 Text Interpretation:  Sinus rhythm Borderline T abnormalities, inferior leads Prolonged QT interval Confirmed by Veryl Speak [phone]) on 05/14/2018 6:09:31 PM     Radiology Dg Chest 2 View  Result Date: 05/14/2018 CLINICAL DATA:  Chest pain. EXAM: CHEST - 2 VIEW COMPARISON:  Radiographs of March 18, 2018. FINDINGS: The heart size and mediastinal contours are within normal limits. Both lungs are clear. No pneumothorax or pleural effusion is noted. The visualized skeletal structures are unremarkable. IMPRESSION: No active cardiopulmonary  disease. Electronically Signed   By: Marijo Conception, M.D.   On: 05/14/2018 15:02    Procedures Procedures (including critical care time)  Medications Ordered in ED Medications  aspirin EC tablet 325 mg (325 mg Oral Given 05/14/18 1622)     Initial Impression / Assessment and Plan / ED Course  I have reviewed the triage vital signs and the nursing notes.  Pertinent labs & imaging results that were available during my care of the patient were reviewed by me and considered in my medical decision making (see chart for details).     Pt presenting to the ED with acute onset of chest pain that began while gathering her outfit for the day. No cardiac hx, recent stress test and echo in 2017 were normal. On exam, she is not in distress. Chest pain reproducible with palpation. Heart and lungs sounds normal. VSS. Initiated cardiac workup. Low risk wells.  3:15PM Initial EKG is normal. CXR neg. Trop is 0.03. Basic labs reassuring. Pt discussed with Dr. Stark Jock. Pain is atypical in nature, low risk for ACS given previous cardiac testing and atypical presentation. Will plan for delta troponin and repeat EKG. If no change,  pt likely safe for discharge with close follow up.  Repeat troponin and EKG are unchanged. Pt evaluated by Dr. Stark Jock. Plan for discharge with close PCP follow up. Reports she is also followed by cardiology. Discussed strict return precautions. Verbalized understanding and agreeable to discharge.   Discussed results, findings, treatment and follow up. Patient advised of return precautions. Patient verbalized understanding and agreed with plan.  Final Clinical Impressions(s) / ED Diagnoses   Final diagnoses:  Atypical chest pain    ED Discharge Orders    None       Caran Storck, [place] N, PA-C 05/14/18 [place], [place] N, PA-C 05/14/18 [place], [place], DO 05/15/18 1719

## 2018-05-14 NOTE — Discharge Instructions (Signed)
Please read instructions below.  You can take tylenol every 4 hours as needed for pain. Return to the ER for new or worsening symptoms; including worsening chest pain, shortness of breath, pain that radiates to the arm or neck, pain or shortness of breath worsened with exertion.  Follow up with your primary care provider/cardiologist in 2 days.

## 2018-05-14 NOTE — ED Triage Notes (Signed)
Pt c/o mid sternal chest pain x 45 mins , denies n/v or SOB

## 2018-06-03 ENCOUNTER — Ambulatory Visit: Payer: Self-pay

## 2018-06-27 ENCOUNTER — Other Ambulatory Visit (INDEPENDENT_AMBULATORY_CARE_PROVIDER_SITE_OTHER): Payer: Self-pay

## 2018-06-27 DIAGNOSIS — E1151 Type 2 diabetes mellitus with diabetic peripheral angiopathy without gangrene: Secondary | ICD-10-CM

## 2018-06-27 DIAGNOSIS — E1165 Type 2 diabetes mellitus with hyperglycemia: Secondary | ICD-10-CM

## 2018-06-27 DIAGNOSIS — IMO0002 Reserved for concepts with insufficient information to code with codable children: Secondary | ICD-10-CM

## 2018-06-27 LAB — LIPID PANEL
Cholesterol: 110 mg/dL (ref 0–200)
HDL: 32.1 mg/dL — ABNORMAL LOW (ref >39.00)
LDL Cholesterol: 54 mg/dL (ref 0–99)
NONHDL: 78.33
Total CHOL/HDL Ratio: 3
Triglycerides: 122 mg/dL (ref 0.0–149.0)
VLDL: 24.4 mg/dL (ref 0.0–40.0)

## 2018-06-27 LAB — COMPREHENSIVE METABOLIC PANEL
ALK PHOS: 42 U/L (ref 39–117)
ALT: 22 U/L (ref 0–35)
AST: 22 U/L (ref 0–37)
Albumin: 4.3 g/dL (ref 3.5–5.2)
BILIRUBIN TOTAL: 0.6 mg/dL (ref 0.2–1.2)
BUN: 10 mg/dL (ref 6–23)
CO2: 30 mEq/L (ref 19–32)
Calcium: 9.3 mg/dL (ref 8.4–10.5)
Chloride: 103 mEq/L (ref 96–112)
Creatinine, Ser: 0.5 mg/dL (ref 0.40–1.20)
GFR: 165.53 mL/min (ref >60.00)
GLUCOSE: 167 mg/dL — AB (ref 70–99)
Potassium: 4 mEq/L (ref 3.5–5.1)
SODIUM: 141 meq/L (ref 135–145)
TOTAL PROTEIN: 6.6 g/dL (ref 6.0–8.3)

## 2018-06-27 LAB — HEMOGLOBIN A1C: HEMOGLOBIN A1C: 9.3 % — AB (ref 4.6–6.5)

## 2018-07-03 ENCOUNTER — Encounter: Payer: Self-pay | Admitting: Family Medicine

## 2018-07-03 ENCOUNTER — Ambulatory Visit (INDEPENDENT_AMBULATORY_CARE_PROVIDER_SITE_OTHER): Payer: Self-pay | Admitting: Family Medicine

## 2018-07-03 VITALS — BP 124/90 | HR 82 | Temp 98.0°F | Resp 16 | Ht 66.0 in | Wt 232.0 lb

## 2018-07-03 DIAGNOSIS — M25571 Pain in right ankle and joints of right foot: Secondary | ICD-10-CM

## 2018-07-03 DIAGNOSIS — IMO0001 Reserved for inherently not codable concepts without codable children: Secondary | ICD-10-CM

## 2018-07-03 DIAGNOSIS — R209 Unspecified disturbances of skin sensation: Secondary | ICD-10-CM

## 2018-07-03 NOTE — Progress Notes (Signed)
[facility] [address], [place], [place] [address] [phone]  Date:  07/03/2018   Name:  Felicia Solis   DOB:  24-Jul-1965   MRN:  [id number]  PCP:  [name], DO    Chief Complaint: Foot Pain (right foot pain, swelling, noticed last friday, radiating up shin, tingling sensation)   History of Present Illness:  Felicia Solis is a 53 y.o. very pleasant female patient who presents with the following:  Pt of Dr. Etter Sjogren with history of DM, hypertension, hyperlipidemia, hypothyroidism She has noted that her right foot was swollen about a week ago NKI, she cannot recall any recent sprain or other cause for the swelling. No pain or redness At this time the swelling is usually better, however she now notes mild pain in the anterior distal shin. Admits that she was concerned about a blood clot. She is never had DVT or PE, not a smoker, no immobility.  She has noted an issue with her right arm as well for 3-4 days She will feel a "funny feeling" in her right arm that will come and go -she cannot really describe this except for saying that it feels strange Not a pain It seems to run from her anterior right shoulder down into the right scapula On further review she says it feels like it might be going numb She is not aware of any injury or other cause of this symptom No weakness of the arm No slurred speech or facial drooping   No CP or SOB No fever Lab Results  Component Value Date   HGBA1C 9.3 (H) 06/27/2018   Xanax as needed Aspirin 81 Celexa 20 Amaryl 2 mg Synthroid 100 Losartan 25 Robaxin as needed Protonix 40 Simvastatin 40 Janumet X are Tramadol 50   Patient Active Problem List   Diagnosis Date Noted  . Myalgia 03/18/2018  . Hyperlipidemia associated with type 2 diabetes mellitus ([place]) 03/18/2018  . Nausea and vomiting 12/02/2017  . Hematuria 12/02/2017  . Pansinusitis 11/19/2017  .  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 Preventative health care 09/27/2017  . Hyperlipidemia LDL goal <100 09/18/2017  . Vitamin D deficiency 09/18/2017  . Hypothyroidism 09/18/2017  . Other intervertebral disc degeneration, lumbar region 04/07/2017  . Left hip pain 04/07/2017  . Hyperlipidemia LDL goal <70 04/07/2017  . Essential hypertension 04/07/2017  . Acute vaginitis 04/07/2017  . S/P complete thyroidectomy 09/28/2016  . Memory loss 08/23/2016  . Thyroid nodule 08/02/2016  . Plantar fasciitis of right foot 12/01/2015  . Knee pain, right 12/01/2015  . Headache, migraine 09/06/2015  . Bilateral knee pain 08/22/2015  . Thoracic myofascial strain 08/22/2015  . Dysphagia 04/10/2015  . Chest pain 04/10/2015  . Epigastric pain 04/10/2015  . Functional diarrhea 04/10/2015  . NSAID long-term use 04/10/2015  . Midepigastric pain 03/22/2015  . Cough 01/07/2015  . Allergic rhinitis 12/13/2014  . Acute bronchitis 12/13/2014  . Blood in stool 10/01/2014  . DM (diabetes mellitus) type II uncontrolled, periph vascular disorder ([place]) 08/13/2014  . Abnormal ECG 05/14/2012  . Breast pain 10/23/2011  . ASCUS on Pap smear 10/08/2011  . H/O thyroid nodule 08/27/2011  . Depression with anxiety 08/27/2011  . Heavy periods 08/27/2011    Past Medical History:  Diagnosis Date  . Anemia    anemia  when pregnant in 2002  . Anxiety   . Arthritis    "knees and back" (  09/28/2016)  . Asthma 5 yrs ago    told she had asthma.. does not remember doctors name.   . Chronic lower back pain   . Depression    takes citalopram  . History of hiatal hernia   . History of kidney stones   . Hyperlipidemia   . Hypothyroidism   . LBBB (left bundle branch block)    rate-related; identified on stress test 03/21/16  . Migraine    "weekly" (09/28/2016)  . 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 Shortness of breath    with exertion  . Thyroid  disease   . Type II diabetes mellitus ([place]) dx'd 2016    Past Surgical History:  Procedure Laterality Date  . DILATION AND CURETTAGE OF UTERUS  1999  . ENDOMETRIAL ABLATION    . LAPAROSCOPY     years ago ..pt does not know where surgery was done ... states" my stomach would blow up then go down"  . THYROIDECTOMY  09/28/2011   Procedure: THYROIDECTOMY;  Surgeon: Izora Gala, MD;  Location: [facility];  Service: ENT;  Laterality: Right;  RIGHT THYROIDECTOMY WITH FROZEN SECTION  . THYROIDECTOMY  09/28/2016   completion of thyroidectomy/notes 09/28/2016  . THYROIDECTOMY Left 09/28/2016   Procedure: COMPLETION OF THYROIDECTOMY;  Surgeon: Izora Gala, MD;  Location: [facility];  Service: ENT;  Laterality: Left;  . TUBAL LIGATION      Social History   Tobacco Use  . Smoking status: Never Smoker  . Smokeless tobacco: Never Used  Substance Use Topics  . Alcohol use: No    Alcohol/week: 0.0 standard drinks  . Drug use: No    Family History  Problem Relation Age of Onset  . Diabetes Mother   . Hypertension Mother   . Hyperlipidemia Mother   . Gallbladder disease Mother   . Heart disease Mother   . Diabetes Sister   . Hypertension Father   . Hyperlipidemia Father   . Colon polyps Father   . Ovarian cancer Maternal Grandmother   . Gallbladder disease Maternal Grandmother   . Colon cancer Paternal Grandfather   . Breast cancer Maternal Aunt   . Heart disease Other        early cad  . Diabetes Brother   . Heart attack Brother   . Anesthesia problems Neg Hx   . Hypotension Neg Hx   . Malignant hyperthermia Neg Hx   . Pseudochol deficiency Neg Hx   . Esophageal cancer Neg Hx     Allergies  Allergen Reactions  . Lisinopril Cough    Medication list has been reviewed and updated.  Current Outpatient Medications on File Prior to Visit  Medication Sig Dispense Refill  . acetaminophen (TYLENOL) 500 MG tablet Take 1,000 mg by mouth every 6 (six) hours as needed for mild pain.    Marland Kitchen  ALPRAZolam (XANAX) 0.5 MG tablet Take 1 tablet (0.5 mg total) by mouth 3 (three) times daily as needed for anxiety. 10 tablet 0  . aspirin EC 81 MG tablet Take 1 tablet (81 mg total) by mouth daily. 90 tablet 3  . azithromycin (ZITHROMAX Z-PAK) 250 MG tablet As directed 6 each 0  . blood glucose meter kit and supplies KIT Dispense based on patient and insurance preference. Use up to four times daily as directed. (FOR ICD-9 250.00, 250.01). 1 each 0  . Cholecalciferol (VITAMIN D3) 2000 units TABS Take 2,000 Units by  mouth daily.     . citalopram (CELEXA) 20 MG tablet Take 1 tablet (20 mg total) by mouth daily. 90 tablet 3  . cyanocobalamin 500 MCG tablet Take 500 mcg by mouth daily.    . cyclobenzaprine (FLEXERIL) 10 MG tablet Take 1 tablet (10 mg total) by mouth at bedtime. (Patient taking differently: Take 10 mg by mouth daily as needed for muscle spasms. ) 10 tablet 0  . diclofenac (VOLTAREN) 75 MG EC tablet   0  . Flaxseed, Linseed, (FLAXSEED OIL PO) Take 1,300 mg by mouth daily.    . fluticasone (FLONASE) 50 MCG/ACT nasal spray Place 2 sprays into both nostrils daily. (Patient taking differently: Place 2 sprays into both nostrils daily as needed for allergies. ) 16 g 1  . fluticasone (FLONASE) 50 MCG/ACT nasal spray Place 2 sprays into both nostrils daily. 16 g 6  . Ginkgo Biloba Extract 60 MG CAPS Take 60 mg by mouth daily.    Marland Kitchen glimepiride (AMARYL) 2 MG tablet Take 1 tablet (2 mg total) by mouth daily before breakfast. 30 tablet 3  . glucose blood (ONETOUCH VERIO) test strip Onetouch Verio Check blood sugar twice daily 100 each 12  . ibuprofen (ADVIL,MOTRIN) 600 MG tablet Take 1 tablet (600 mg total) by mouth every 6 (six) hours as needed for mild pain. 30 tablet 0  . levothyroxine (SYNTHROID) 100 MCG tablet Take 1 tablet (100 mcg total) by mouth daily. 30 tablet 6  . losartan (COZAAR) 25 MG tablet Take 1 tablet (25 mg total) by mouth daily. 90 tablet 1  . losartan (COZAAR) 25 MG tablet TAKE  1 TABLET BY MOUTH ONCE DAILY 90 tablet 1  . methocarbamol (ROBAXIN) 500 MG tablet Take 1 tablet (500 mg total) by mouth 2 (two) times daily. 20 tablet 0  . Omega-3 Fatty Acids (FISH OIL) 1200 MG CAPS Take 1,200 mg by mouth daily.    . ondansetron (ZOFRAN) 8 MG tablet Take 1 tablet (8 mg total) by mouth every 8 (eight) hours as needed for nausea or vomiting. 20 tablet 0  . ONETOUCH DELICA LANCETS FINE MISC Check blood sugar twice daily 100 each 12  . pantoprazole (PROTONIX) 40 MG tablet Take 1 tablet (40 mg total) by mouth daily. 90 tablet 3  . rosuvastatin (CRESTOR) 20 MG tablet TAKE 1 TABLET BY MOUTH ONCE DAILY 90 tablet 1  . simvastatin (ZOCOR) 40 MG tablet Take 1 tablet (40 mg total) by mouth at bedtime. 90 tablet 1  . SitaGLIPtin-MetFORMIN HCl (JANUMET XR) [phone] MG TB24 TAKE 1 TABLET BY MOUTH ONCE DAILY 90 tablet 1  . traMADol (ULTRAM) 50 MG tablet Take 1 tablet (50 mg total) by mouth every 8 (eight) hours as needed. 20 tablet 0   No current facility-administered medications on file prior to visit.     Review of Systems:  As per HPI- otherwise negative.   Physical Examination: Vitals:   07/03/18 1551  BP: 124/90  Pulse: 82  Resp: 16  Temp: 98 F (36.7 C)  SpO2: 100%   Vitals:   07/03/18 1551  Weight: 232 lb (105.2 kg)  Height: '5\' 6"'$  (1.676 m)   Body mass index is 37.45 kg/m. Ideal Body Weight: Weight in (lb) to have BMI = 25: 154.6  GEN: WDWN, NAD, Non-toxic, A & O x 3 obese, looks well HEENT: Atraumatic, Normocephalic. Neck supple. No masses, No LAD.Bilateral TM wnl, oropharynx normal.  PEERL,EOMI.   Ears and Nose: No external deformity. CV: RRR, No M/G/R.  No JVD. No thrill. No extra heart sounds. PULM: CTA B, no wheezes, crackles, rhonchi. No retractions. No resp. distress. No accessory muscle use. EXTR: No c/c/e NEURO Normal gait.  Normal strength and movement of facial muscles Normal balance PSYCH: Normally interactive. Conversant. Not depressed or anxious  appearing.  Calm demeanor.  There is no swelling, tenderness, or cords of either calf.  The right foot and ankle are basically normal on exam.  No significant edema is appreciated.  She notes mild tenderness along the anterior ankle and lower right shin. Normal strength, sensation, DTR both upper extremities.  She indicates that this "strange feeling" seems to originate over her right shoulder blade.  No swelling or erythema is noted, no change in the skin.  I do not see any rash to suggest shingles.  Normal range of motion of her neck.  Assessment and Plan: Acute right ankle pain  Paresthesias/numbness  Here today with 2 symptoms. She had noticed some right foot swelling about a week ago, now has some mild pain of the right anterior ankle.  I have reassured her that I do not think this is due to a blood clot, other we are happy to do an ultrasound if she would like.  She declines an ultrasound and also x-ray at this time.  She will rest her ankle, and may use an over-the-counter lace up ankle support.  She will let me know if this continues to be a problem. Strange sensation right shoulder, the exact cause of this is not immediately apparent.  Offered to do plain films of her neck, or potentially an MRI.  We discussed that this numbness could be a sign of something more serious like a stroke.  She does not have any other symptoms, and her physical exam is within normal.  However offered to have her seen in the ER now for further evaluation, which she also declines.  Advised her to watch her symptoms closely, and seek care immediately if she has any worsening.  Otherwise she will plan to see her PCP soon to discuss her diabetes, as her recent A1c was worse  Signed Lamar Blinks, MD

## 2018-07-03 NOTE — Patient Instructions (Addendum)
Is good to see you today, I hope that both of your issues resolve soon. I think that your foot and ankle issue is due to a musculoskeletal strain.  If this continues to bother you, please let me know.  In that case we may wish to do an x-ray, and/or have you see sports medicine.  Try to rest your ankle when you can.  Ice and elevation may be helpful.  You might also get an over-the-counter lace up ankle support to use as needed  The feeling in your right shoulder is difficult to diagnose.  If you have any other symptoms such as shortness of breath or chest pain, or stroke symptoms like we discussed, please seek care immediately.  Otherwise, if this continues we may wish to do an x-ray of your neck, an MRI of your neck, and/or refer you to neurology  As always we do not have a definite diagnosis, caution is required.  If you feel like you are not doing okay or if you are feeling worse, please go to the emergency room for evaluation right away.

## 2018-07-04 ENCOUNTER — Ambulatory Visit: Payer: Self-pay | Admitting: Family Medicine

## 2018-07-08 ENCOUNTER — Telehealth: Payer: Self-pay | Admitting: Family Medicine

## 2018-07-08 NOTE — Telephone Encounter (Signed)
Attempted to return call to discuss lab results; left vm to call office; see result note.

## 2018-07-08 NOTE — Telephone Encounter (Signed)
Copied from [name]. Topic: Conservator, museum/gallery Patient (Clinic Use ONLY) >> Jul 08, 2018  2:32 PM Felicia Solis R wrote: Office called patient for results. Pt returning call. See result note

## 2018-07-10 MED ORDER — SITAGLIPTIN PHOS-METFORMIN HCL 50-1000 MG PO TABS: 2.0000 | ORAL_TABLET | Freq: Every day | ORAL | 2 refills | Status: DC

## 2018-07-10 NOTE — Telephone Encounter (Addendum)
Rx sent. Pt is asking if she can come in for lab appt to check B12 level. Left message for pt to return my call and let us know why she is wanting B12 level checked (symptoms?) Ok for [facility] / triage to discuss with pt.  Notes recorded by [name], DO on 07/04/2018 at 2:24 PM EST Dm not controlled ----- Change janument xr to 50/1000 mg 2 po qd #60 2 refills  Rest of labs ok

## 2018-07-10 NOTE — Addendum Note (Signed)
Addended by: Kelle Darting A on: 07/10/2018 11:07 AM   Modules accepted: Orders

## 2018-07-14 ENCOUNTER — Other Ambulatory Visit: Payer: Self-pay

## 2018-07-14 ENCOUNTER — Emergency Department (HOSPITAL_BASED_OUTPATIENT_CLINIC_OR_DEPARTMENT_OTHER)
Admission: EM | Admit: 2018-07-14 | Discharge: 2018-07-14 | Disposition: A | Discharge status: Home / Self Care | Payer: Self-pay | Attending: Emergency Medicine | Admitting: Emergency Medicine

## 2018-07-14 ENCOUNTER — Other Ambulatory Visit: Payer: Self-pay | Admitting: *Deleted

## 2018-07-14 ENCOUNTER — Encounter (HOSPITAL_BASED_OUTPATIENT_CLINIC_OR_DEPARTMENT_OTHER): Payer: Self-pay

## 2018-07-14 DIAGNOSIS — E039 Hypothyroidism, unspecified: Secondary | ICD-10-CM | POA: Insufficient documentation

## 2018-07-14 DIAGNOSIS — Z7982 Long term (current) use of aspirin: Secondary | ICD-10-CM | POA: Diagnosis not present

## 2018-07-14 DIAGNOSIS — E785 Hyperlipidemia, unspecified: Secondary | ICD-10-CM | POA: Diagnosis not present

## 2018-07-14 DIAGNOSIS — Z79899 Other long term (current) drug therapy: Secondary | ICD-10-CM | POA: Insufficient documentation

## 2018-07-14 DIAGNOSIS — J45909 Unspecified asthma, uncomplicated: Secondary | ICD-10-CM | POA: Diagnosis not present

## 2018-07-14 DIAGNOSIS — I10 Essential (primary) hypertension: Secondary | ICD-10-CM | POA: Insufficient documentation

## 2018-07-14 DIAGNOSIS — Z7984 Long term (current) use of oral hypoglycemic drugs: Secondary | ICD-10-CM | POA: Diagnosis not present

## 2018-07-14 DIAGNOSIS — M79601 Pain in right arm: Secondary | ICD-10-CM | POA: Diagnosis not present

## 2018-07-14 DIAGNOSIS — E1169 Type 2 diabetes mellitus with other specified complication: Secondary | ICD-10-CM | POA: Diagnosis not present

## 2018-07-14 DIAGNOSIS — E538 Deficiency of other specified B group vitamins: Secondary | ICD-10-CM

## 2018-07-14 MED ORDER — KETOROLAC TROMETHAMINE 60 MG/2ML IM SOLN
30.0000 mg | Freq: Once | INTRAMUSCULAR | Status: AC
  Administered 2018-07-14: 30 mg via INTRAMUSCULAR
  Filled 2018-07-14: qty 2

## 2018-07-14 MED ORDER — LIDOCAINE 5 % EX PTCH: 1.0000 | MEDICATED_PATCH | CUTANEOUS | 0 refills | Status: DC

## 2018-07-14 MED ORDER — LIDOCAINE 5 % EX PTCH
1.0000 | MEDICATED_PATCH | CUTANEOUS | Status: DC
  Filled 2018-07-14: qty 1

## 2018-07-14 MED ORDER — NAPROXEN 375 MG PO TABS: 375.0000 mg | ORAL_TABLET | Freq: Two times a day (BID) | ORAL | 0 refills | Status: DC

## 2018-07-14 NOTE — ED Triage Notes (Signed)
C/o pain, tingling to right UE and right flank x 2 weeks-NAD-steady gait

## 2018-07-14 NOTE — ED Provider Notes (Signed)
[facility] Provider Note   CSN: [id number] Arrival date & time: 07/14/18  1532     History   Chief Complaint Chief Complaint  Patient presents with  . Arm Pain    HPI ROSALENA Solis is a 53 y.o. female.  HPI   53 year old female, with PMH of [place], HLD, presents with a 2-week history of intermittent right shoulder right arm pain.  She states pain is worse with leaning on her right arm.  She describes the pain as a dull ache starting in the shoulder radiating to her right forearm.  She has taken Tylenol and Motrin with some improvement in her pain.  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  She denies any associated decreased range of motion, decreased sensation.  She denies any chest pain, shortness of breath, fevers or chills.  She denies any direct injury or trauma to the area.  Past Medical History:  Diagnosis Date  . Anemia    anemia  when pregnant in 2002  . Anxiety   . Arthritis    "knees and back" (09/28/2016)  . Asthma 5 yrs ago    told she had asthma.. does not remember doctors name.   . Chronic lower back pain   . Depression    takes citalopram  . History of hiatal hernia   . History of kidney stones   . Hyperlipidemia   . Hypothyroidism   . LBBB (left bundle branch block)    rate-related; identified on stress test 03/21/16  . Migraine    "weekly" (09/28/2016)  . 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 Shortness of breath    with exertion  . Thyroid disease   . Type II diabetes mellitus ([place]) dx'd 2016    Patient Active Problem List   Diagnosis Date Noted  . Myalgia 03/18/2018  . Hyperlipidemia associated with type 2 diabetes mellitus ([place]) 03/18/2018  . Nausea and vomiting 12/02/2017  . Hematuria 12/02/2017  . Pansinusitis 11/19/2017  . 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 Preventative health care 09/27/2017  . Hyperlipidemia LDL goal <100  09/18/2017  . Vitamin D deficiency 09/18/2017  . Hypothyroidism 09/18/2017  . Other intervertebral disc degeneration, lumbar region 04/07/2017  . Left hip pain 04/07/2017  . Hyperlipidemia LDL goal <70 04/07/2017  . Essential hypertension 04/07/2017  . Acute vaginitis 04/07/2017  . S/P complete thyroidectomy 09/28/2016  . Memory loss 08/23/2016  . Thyroid nodule 08/02/2016  . Plantar fasciitis of right foot 12/01/2015  . Knee pain, right 12/01/2015  . Headache, migraine 09/06/2015  . Bilateral knee pain 08/22/2015  . Thoracic myofascial strain 08/22/2015  . Dysphagia 04/10/2015  . Chest pain 04/10/2015  . Epigastric pain 04/10/2015  . Functional diarrhea 04/10/2015  . NSAID long-term use 04/10/2015  . Midepigastric pain 03/22/2015  . Cough 01/07/2015  . Allergic rhinitis 12/13/2014  . Acute bronchitis 12/13/2014  . Blood in stool 10/01/2014  . DM (diabetes mellitus) type II uncontrolled, periph vascular disorder ([place]) 08/13/2014  . Abnormal ECG 05/14/2012  . Breast pain 10/23/2011  . ASCUS on Pap smear 10/08/2011  . H/O thyroid nodule 08/27/2011  . Depression with anxiety 08/27/2011  . Heavy periods 08/27/2011    Past Surgical History:  Procedure Laterality Date  . DILATION AND CURETTAGE OF UTERUS  1999  . ENDOMETRIAL ABLATION    . LAPAROSCOPY  years ago ..pt does not know where surgery was done ... states" my stomach would blow up then go down"  . THYROIDECTOMY  09/28/2011   Procedure: THYROIDECTOMY;  Surgeon: Izora Gala, MD;  Location: [facility];  Service: ENT;  Laterality: Right;  RIGHT THYROIDECTOMY WITH FROZEN SECTION  . THYROIDECTOMY  09/28/2016   completion of thyroidectomy/notes 09/28/2016  . THYROIDECTOMY Left 09/28/2016   Procedure: COMPLETION OF THYROIDECTOMY;  Surgeon: Izora Gala, MD;  Location: [facility];  Service: ENT;  Laterality: Left;  . TUBAL LIGATION       OB History    Gravida  5   Para  3   Term  3   Preterm      AB  2   Living  3     SAB    2   TAB      Ectopic      Multiple      Live Births               Home Medications    Prior to Admission medications   Medication Sig Start Date End Date Taking? Authorizing Provider  acetaminophen (TYLENOL) 500 MG tablet Take 1,000 mg by mouth every 6 (six) hours as needed for mild pain.    [provider]  ALPRAZolam Duanne Moron) 0.5 MG tablet Take 1 tablet (0.5 mg total) by mouth 3 (three) times daily as needed for anxiety. 11/19/17   [name], DO  aspirin EC 81 MG tablet Take 1 tablet (81 mg total) by mouth daily. 03/07/16   End, Harrell Gave, MD  azithromycin (ZITHROMAX Z-PAK) 250 MG tablet As directed 03/18/18   Carollee Herter, Marti Acebo Fries R, DO  blood glucose meter kit and supplies KIT Dispense based on patient and insurance preference. Use up to four times daily as directed. (FOR ICD-9 250.00, 250.01). 09/17/17   Carollee Herter, Alferd Apa, DO  Cholecalciferol (VITAMIN D3) 2000 units TABS Take 2,000 Units by mouth daily.     [provider]  citalopram (CELEXA) 20 MG tablet Take 1 tablet (20 mg total) by mouth daily. 05/27/17   [name], DO  cyanocobalamin 500 MCG tablet Take 500 mcg by mouth daily.    [provider]  cyclobenzaprine (FLEXERIL) 10 MG tablet Take 1 tablet (10 mg total) by mouth at bedtime. Patient taking differently: Take 10 mg by mouth daily as needed for muscle spasms.  08/21/16   [name], DO  diclofenac (VOLTAREN) 75 MG EC tablet  08/05/17   [provider]  Flaxseed, Linseed, (FLAXSEED OIL PO) Take 1,300 mg by mouth daily.    [provider]  fluticasone (FLONASE) 50 MCG/ACT nasal spray Place 2 sprays into both nostrils daily. Patient taking differently: Place 2 sprays into both nostrils daily as needed for allergies.  05/28/16   Saguier, Percell Miller, PA-C  fluticasone (FLONASE) 50 MCG/ACT nasal spray Place 2 sprays into both nostrils daily. 11/19/17   [name], DO  Ginkgo Biloba Extract  60 MG CAPS Take 60 mg by mouth daily.    [provider]  glimepiride (AMARYL) 2 MG tablet Take 1 tablet (2 mg total) by mouth daily before breakfast. 04/04/18   Carollee Herter, Alferd Apa, DO  glucose blood (ONETOUCH VERIO) test strip [facility] Check blood sugar twice daily 05/27/17   Carollee Herter, Alferd Apa, DO  ibuprofen (ADVIL,MOTRIN) 600 MG tablet Take 1 tablet (600 mg total) by mouth every 6 (six) hours as needed  for mild pain. 05/14/18   Robinson, [place] N, PA-C  levothyroxine (SYNTHROID) 100 MCG tablet Take 1 tablet (100 mcg total) by mouth daily. 09/28/16   Izora Gala, MD  losartan (COZAAR) 25 MG tablet Take 1 tablet (25 mg total) by mouth daily. 05/27/17   Roma Schanz R, DO  losartan (COZAAR) 25 MG tablet TAKE 1 TABLET BY MOUTH ONCE DAILY 12/09/17   Carollee Herter, Alferd Apa, DO  methocarbamol (ROBAXIN) 500 MG tablet Take 1 tablet (500 mg total) by mouth 2 (two) times daily. 01/13/17   Larene Pickett, PA-C  Omega-3 Fatty Acids (FISH OIL) 1200 MG CAPS Take 1,200 mg by mouth daily.    [provider]  ondansetron (ZOFRAN) 8 MG tablet Take 1 tablet (8 mg total) by mouth every 8 (eight) hours as needed for nausea or vomiting. 12/02/17   Carollee Herter, Alferd Apa, DO  [facility] DELICA LANCETS FINE MISC Check blood sugar twice daily 08/26/14   Carollee Herter, Tema Alire Fries R, DO  pantoprazole (PROTONIX) 40 MG tablet Take 1 tablet (40 mg total) by mouth daily. 09/27/17   Roma Schanz R, DO  rosuvastatin (CRESTOR) 20 MG tablet TAKE 1 TABLET BY MOUTH ONCE DAILY 02/04/18   Carollee Herter, Alferd Apa, DO  simvastatin (ZOCOR) 40 MG tablet Take 1 tablet (40 mg total) by mouth at bedtime. 09/27/17   [name], DO  sitaGLIPtin-metformin (JANUMET) 50-1000 MG tablet Take 2 tablets by mouth daily with breakfast. 07/10/18   Carollee Herter, Alferd Apa, DO  traMADol (ULTRAM) 50 MG tablet Take 1 tablet (50 mg total) by mouth every 8 (eight) hours as needed. 03/18/18   [name], DO    Family  History Family History  Problem Relation Age of Onset  . Diabetes Mother   . Hypertension Mother   . Hyperlipidemia Mother   . Gallbladder disease Mother   . Heart disease Mother   . Diabetes Sister   . Hypertension Father   . Hyperlipidemia Father   . Colon polyps Father   . Ovarian cancer Maternal Grandmother   . Gallbladder disease Maternal Grandmother   . Colon cancer Paternal Grandfather   . Breast cancer Maternal Aunt   . Heart disease Other        early cad  . Diabetes Brother   . Heart attack Brother   . Anesthesia problems Neg Hx   . Hypotension Neg Hx   . Malignant hyperthermia Neg Hx   . Pseudochol deficiency Neg Hx   . Esophageal cancer Neg Hx     Social History Social History   Tobacco Use  . Smoking status: Never Smoker  . Smokeless tobacco: Never Used  Substance Use Topics  . Alcohol use: No    Alcohol/week: 0.0 standard drinks  . Drug use: No     Allergies   Lisinopril   Review of Systems Review of Systems  Constitutional: Negative for chills and fever.  Respiratory: Negative for shortness of breath.   Cardiovascular: Negative for chest pain.  Gastrointestinal: Negative for abdominal pain, nausea and vomiting.  Musculoskeletal: Positive for arthralgias (right shoulder pain). Negative for joint swelling.  Neurological: Negative for weakness.     Physical Exam Updated Vital Signs BP (!) 175/100 (BP Location: Left Arm)   Pulse 79   Temp 97.9 F (36.6 C) (Oral)   Resp 14   Ht '5\' 6"'$  (1.676 m)   Wt 104.3 kg   SpO2 100%   BMI 37.12 kg/m  Physical Exam Vitals signs and nursing note reviewed.  Constitutional:      Appearance: She is well-developed.  HENT:     Head: Normocephalic and atraumatic.  Eyes:     Conjunctiva/sclera: Conjunctivae normal.  Neck:     Musculoskeletal: Neck supple.  Cardiovascular:     Rate and Rhythm: Normal rate and regular rhythm.     Heart sounds: Normal heart sounds. No murmur.  Pulmonary:     Effort:  Pulmonary effort is normal. No respiratory distress.     Breath sounds: Normal breath sounds. No wheezing or rales.  Abdominal:     General: Bowel sounds are normal. There is no distension.     Palpations: Abdomen is soft.     Tenderness: There is no abdominal tenderness.  Musculoskeletal: Normal range of motion.        General: No deformity.     Right shoulder: She exhibits tenderness (mildly tender of the posterior aspect). She exhibits normal pulse and normal strength.     Comments: Physical exam inconsistent.  When patient tilts her head to the side is hurting, she has worsening in her pain.  With tenderness top and pressing on the ulnar nerve at the elbow, patient's pain increases in the shoulder.  She has full range of motion of bilateral upper extremities, neurovascular intact distally, strength 5 out of 5.  No swelling or erythema noted of the right upper extremity.  Skin:    General: Skin is warm and dry.     Findings: No erythema or rash.  Neurological:     Mental Status: She is alert and oriented to person, place, and time.  Psychiatric:        Behavior: Behavior normal.      ED Treatments / Results  Labs (all labs ordered are listed, but only abnormal results are displayed) Labs Reviewed - No data to display  EKG None  Radiology No results found.  Procedures Procedures (including critical care time)  Medications Ordered in ED Medications  lidocaine (LIDODERM) 5 % 1 patch (has no administration in time range)  ketorolac (TORADOL) injection 30 mg (30 mg Intramuscular Given 07/14/18 1639)     Initial Impression / Assessment and Plan / ED Course  I have reviewed the triage vital signs and the nursing notes.  Pertinent labs & imaging results that were available during my care of the patient were reviewed by me and considered in my medical decision making (see chart for details).     Presented with a 2-week history of right shoulder and right arm pain.  Pain  worsens with moving the neck toward the affected side.  When pressing ulnar nerve at the wrist or elbow her pain worsens in the shoulder.  Physical exam inconsistent with history.  Unclear etiology of her symptoms, although anticipate this is musculoskeletal given the nature of her job.  Unlikely ACS given no chest pain or shortness of breath, no exertional component.  Pain is easily reproduced.  No palpable deformities, she has full range of motion, is neurovascularly intact.  No imaging obtained as there was no trauma or injury.  She was given Toradol in the ED and stated it improved her pain.  Will discharge with an NSAID prescription.  Given strict return precautions.  Patient resting comfortably in bed, no acute distress, nontoxic, non-lethargic.  Vital signs stable.  She is ready and stable for discharge.   At this time there does not appear to be any evidence of an acute  emergency medical condition and the patient appears stable for discharge with appropriate outpatient follow up.Diagnosis was discussed with patient who verbalizes understanding and is agreeable to discharge.   Final Clinical Impressions(s) / ED Diagnoses   Final diagnoses:  None    ED Discharge Orders    None       Rachel Moulds 07/29/18 1259    Hayden Rasmussen, MD 07/30/18 [phone]

## 2018-07-14 NOTE — Discharge Instructions (Signed)
Take naproxen as needed for pain.  Ice affected area.  Follow-up with your primary care provider in 2 days for continued evaluation.  Return to the ED immediately for new or worsening symptoms or concerns, such as fevers, decreased range of motion, weakness, chest pain, shortness of breath or any concerns at all.

## 2018-07-14 NOTE — Telephone Encounter (Signed)
Patient does not have insurance and cannot afford the Janumet.  Can you change to something else?    Patient has a history of b12 deficiency so b12 ordered

## 2018-07-15 NOTE — Telephone Encounter (Signed)
Starlix 60 mg 1 po tid with meals #90  2 refills

## 2018-07-16 MED ORDER — NATEGLINIDE 60 MG PO TABS: 60.0000 mg | ORAL_TABLET | Freq: Three times a day (TID) | ORAL | 2 refills | Status: DC

## 2018-07-16 NOTE — Addendum Note (Signed)
Addended by: Kem Boroughs D on: 07/16/2018 04:36 PM   Modules accepted: Orders

## 2018-07-16 NOTE — Addendum Note (Signed)
Addended by: Kem Boroughs D on: 07/16/2018 04:25 PM   Modules accepted: Orders

## 2018-07-17 ENCOUNTER — Other Ambulatory Visit: Payer: Self-pay

## 2018-07-17 NOTE — Telephone Encounter (Signed)
Spoke with patient on the phone yesterday.  She has been taking Janumet 100/1000.   I think there was a mix up and she had meant she could not afford Jardiance.  In her most recent labs you had stated that you want to increase her Janumet to 50/1000.  It look like another assistant sent in the 50/1000.  Can you please advise on what to do?

## 2018-07-17 NOTE — Telephone Encounter (Signed)
She is to take 2 of the janumet 50/1000 to make dose 100/2000

## 2018-07-21 NOTE — Telephone Encounter (Signed)
Left message on machine that she needs to take  2 tablets of the Janumet 50/1000

## 2018-08-11 ENCOUNTER — Other Ambulatory Visit: Payer: Self-pay | Admitting: Family Medicine

## 2018-08-11 DIAGNOSIS — F411 Generalized anxiety disorder: Secondary | ICD-10-CM

## 2018-08-12 ENCOUNTER — Encounter: Payer: Self-pay | Admitting: Internal Medicine

## 2018-08-12 ENCOUNTER — Ambulatory Visit (INDEPENDENT_AMBULATORY_CARE_PROVIDER_SITE_OTHER): Payer: Self-pay | Admitting: Internal Medicine

## 2018-08-12 ENCOUNTER — Other Ambulatory Visit: Payer: Self-pay

## 2018-08-12 ENCOUNTER — Ambulatory Visit (HOSPITAL_BASED_OUTPATIENT_CLINIC_OR_DEPARTMENT_OTHER)
Admission: RE | Admit: 2018-08-12 | Discharge: 2018-08-12 | Disposition: A | Discharge status: Home / Self Care | Payer: BLUE CROSS/BLUE SHIELD | Source: Ambulatory Visit | Attending: Internal Medicine | Admitting: Internal Medicine

## 2018-08-12 VITALS — BP 126/68 | HR 111 | Temp 100.7°F | Resp 16 | Ht 66.0 in | Wt 229.1 lb

## 2018-08-12 DIAGNOSIS — R509 Fever, unspecified: Secondary | ICD-10-CM

## 2018-08-12 DIAGNOSIS — B349 Viral infection, unspecified: Secondary | ICD-10-CM

## 2018-08-12 LAB — POCT RAPID STREP A (OFFICE): RAPID STREP A SCREEN: NEGATIVE

## 2018-08-12 LAB — POCT INFLUENZA A/B
INFLUENZA A, POC: NEGATIVE
Influenza B, POC: NEGATIVE

## 2018-08-12 MED ORDER — HYDROCODONE-HOMATROPINE 5-1.5 MG/5ML PO SYRP: 5.0000 mL | ORAL_SOLUTION | Freq: Every evening | ORAL | 0 refills | Status: DC | PRN

## 2018-08-12 MED ORDER — OSELTAMIVIR PHOSPHATE 75 MG PO CAPS: 75.0000 mg | ORAL_CAPSULE | Freq: Two times a day (BID) | ORAL | 0 refills | Status: DC

## 2018-08-12 NOTE — Progress Notes (Signed)
Pre visit review using our clinic review tool, if applicable. No additional management support is needed unless otherwise documented below in the visit note. 

## 2018-08-12 NOTE — Progress Notes (Signed)
Subjective:    Patient ID: [facility], female    DOB: [birth year], 53 y.o.   MRN: [id number]  DOS:  08/12/2018 Type of visit - description: Acute Symptoms started yesterday with cough. She woke up today aching all over, reports subjective fever.  She also had nausea and vomiting once. She has not traveled outside of the county. 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  No other sick contacts.  Review of Systems Denies sinus pain and congestion No chest pain no difficulty breathing No diarrhea No sputum production No rash No headache  Past Medical History:  Diagnosis Date  . Anemia    anemia  when pregnant in 2002  . Anxiety   . Arthritis    "knees and back" (09/28/2016)  . Asthma 5 yrs ago    told she had asthma.. does not remember doctors name.   . Chronic lower back pain   . Depression    takes citalopram  . History of hiatal hernia   . History of kidney stones   . Hyperlipidemia   . Hypothyroidism   . LBBB (left bundle branch block)    rate-related; identified on stress test 03/21/16  . Migraine    "weekly" (09/28/2016)  . 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 Shortness of breath    with exertion  . Thyroid disease   . Type II diabetes mellitus ([place]) dx'd 2016    Past Surgical History:  Procedure Laterality Date  . DILATION AND CURETTAGE OF UTERUS  1999  . ENDOMETRIAL ABLATION    . LAPAROSCOPY     years ago ..pt does not know where surgery was done ... states" my stomach would blow up then go down"  . THYROIDECTOMY  09/28/2011   Procedure: THYROIDECTOMY;  Surgeon: Izora Gala, MD;  Location: [place];  Service: ENT;  Laterality: Right;  RIGHT THYROIDECTOMY WITH FROZEN SECTION  . THYROIDECTOMY  09/28/2016   completion of thyroidectomy/notes 09/28/2016  . THYROIDECTOMY Left 09/28/2016   Procedure: COMPLETION OF THYROIDECTOMY;  Surgeon: Izora Gala, MD;   Location: [place];  Service: ENT;  Laterality: Left;  . TUBAL LIGATION      Social History   Socioeconomic History  . Marital status: Married    Spouse name: Not on file  . Number of children: 3  . Years of education: Not on file  . Highest education level: Not on file  Occupational History  . Occupation: CNA  Social Needs  . Financial resource strain: Not on file  . Food insecurity:    Worry: Not on file    Inability: Not on file  . Transportation needs:    Medical: Not on file    Non-medical: Not on file  Tobacco Use  . Smoking status: Never Smoker  . Smokeless tobacco: Never Used  Substance and Sexual Activity  . Alcohol use: No    Alcohol/week: 0.0 standard drinks  . Drug use: No  . Sexual activity: Not on file  Lifestyle  . Physical activity:    Days per week: Not on file    Minutes per session: Not on file  . Stress: Not on file  Relationships  . Social connections:    Talks on phone: Not on file    Gets together: Not on file    Attends religious service: Not on  file    Active member of club or organization: Not on file    Attends meetings of clubs or organizations: Not on file    Relationship status: Not on file  . Intimate partner violence:    Fear of current or ex partner: Not on file    Emotionally abused: Not on file    Physically abused: Not on file    Forced sexual activity: Not on file  Other Topics Concern  . Not on file  Social History Narrative   No exercise--  Walking and lifting pts at work      Allergies as of 08/12/2018      Reactions   Lisinopril Cough      Medication List       Accurate as of August 12, 2018 11:59 PM. Always use your most recent med list.        acetaminophen 500 MG tablet Commonly known as:  TYLENOL Take 1,000 mg by mouth every 6 (six) hours as needed for mild pain.   ALPRAZolam 0.5 MG tablet Commonly known as:  XANAX Take 1 tablet (0.5 mg total) by mouth 3 (three) times daily as needed for anxiety.     aspirin EC 81 MG tablet Take 1 tablet (81 mg total) by mouth daily.   blood glucose meter kit and supplies Kit Dispense based on patient and insurance preference. Use up to four times daily as directed. (FOR ICD-9 250.00, 250.01).   citalopram 20 MG tablet Commonly known as:  CELEXA TAKE 1 TABLET BY MOUTH ONCE DAILY   cyclobenzaprine 10 MG tablet Commonly known as:  FLEXERIL Take 1 tablet (10 mg total) by mouth at bedtime.   diclofenac 75 MG EC tablet Commonly known as:  VOLTAREN   Fish Oil 1200 MG Caps Take 1,200 mg by mouth daily.   FLAXSEED OIL PO Take 1,300 mg by mouth daily.   fluticasone 50 MCG/ACT nasal spray Commonly known as:  FLONASE Place 2 sprays into both nostrils daily.   Ginkgo Biloba Extract 60 MG Caps Take 60 mg by mouth daily.   glimepiride 2 MG tablet Commonly known as:  AMARYL Take 1 tablet (2 mg total) by mouth daily before breakfast.   glucose blood test strip Commonly known as:  ONETOUCH VERIO Onetouch Verio Check blood sugar twice daily   HYDROcodone-homatropine 5-1.5 MG/5ML syrup Commonly known as:  HYCODAN Take 5 mLs by mouth at bedtime as needed for cough.   ibuprofen 600 MG tablet Commonly known as:  ADVIL,MOTRIN Take 1 tablet (600 mg total) by mouth every 6 (six) hours as needed for mild pain.   levothyroxine 100 MCG tablet Commonly known as:  SYNTHROID Take 1 tablet (100 mcg total) by mouth daily.   lidocaine 5 % Commonly known as:  LIDODERM Place 1 patch onto the skin daily. Remove & Discard patch within 12 hours or as directed by MD   losartan 25 MG tablet Commonly known as:  COZAAR TAKE 1 TABLET BY MOUTH ONCE DAILY   methocarbamol 500 MG tablet Commonly known as:  ROBAXIN Take 1 tablet (500 mg total) by mouth 2 (two) times daily.   naproxen 375 MG tablet Commonly known as:  NAPROSYN Take 1 tablet (375 mg total) by mouth 2 (two) times daily.   ondansetron 8 MG tablet Commonly known as:  ZOFRAN Take 1 tablet (8 mg  total) by mouth every 8 (eight) hours as needed for nausea or vomiting.   ONETOUCH DELICA LANCETS FINE Misc Check blood   sugar twice daily   oseltamivir 75 MG capsule Commonly known as:  TAMIFLU Take 1 capsule (75 mg total) by mouth 2 (two) times daily.   pantoprazole 40 MG tablet Commonly known as:  PROTONIX Take 1 tablet (40 mg total) by mouth daily.   rosuvastatin 20 MG tablet Commonly known as:  CRESTOR TAKE 1 TABLET BY MOUTH ONCE DAILY   sitaGLIPtin-metformin 50-1000 MG tablet Commonly known as:  JANUMET Take 2 tablets by mouth daily with breakfast.   traMADol 50 MG tablet Commonly known as:  ULTRAM Take 1 tablet (50 mg total) by mouth every 8 (eight) hours as needed.   vitamin B-12 500 MCG tablet Commonly known as:  CYANOCOBALAMIN Take 500 mcg by mouth daily.   Vitamin D3 50 MCG (2000 UT) Tabs Take 2,000 Units by mouth daily.           Objective:   Physical Exam BP 126/68 (BP Location: Left Arm, Patient Position: Sitting, Cuff Size: Normal)   Pulse (!) 111   Temp (!) 100.7 F (38.2 C) (Oral)   Resp 16   Ht 5' 6" (1.676 m)   Wt 229 lb 2 oz (103.9 kg)   SpO2 98%   BMI 36.98 kg/m  General:   Well developed, slightly uncomfortable appearing but not toxic.  Febrile. HEENT:  Normocephalic . Face symmetric, atraumatic. Nose is slightly congested, throat symmetric, no red. TMs: Both are bulge but no red. Lungs:  Minimal rhonchi, otherwise clear. Normal respiratory effort, no intercostal retractions, no accessory muscle use. Heart: RRR,  no murmur.  no pretibial edema bilaterally  Abdomen:  Not distended, soft, non-tender. No rebound or rigidity.   Skin: Not pale. Not jaundice Neurologic:  alert & oriented X3.  Speech normal, gait appropriate for age and unassisted Psych--  Cognition and judgment appear intact.  Cooperative with normal attention span and concentration.  Behavior appropriate. No anxious or depressed appearing.     Assessment     52-year-old female,PMH includes DM, hypothyroidism, HTN, BTL  presents with  Viral syndrome: Flu and strep test negative.  Symptoms are consistent with influenza however, to be sure will get a chest x-ray to rule out pneumonia and otherwise we will treat with Tamiflu, rest, fluids.  Also Tylenol and ibuprofen, see instructions. Knows to call if he if she is not quickly improving. She is contagious, knows to stay away from elderly people patient aware.  Work note provided. Also request something stronger for cough, in the past a "strong syrup" helped, prescription for hydrocodone provided.  

## 2018-08-12 NOTE — Patient Instructions (Addendum)
Please get a chest x-ray at the first floor.  Rest, fluids   Tylenol  500 mg OTC 2 tabs a day every 8 hours as needed for pain  IBUPROFEN (Advil or Motrin) 200 mg 2 tablets every 6 hours as needed for pain.  Always take it with food because may cause gastritis and ulcers.  If you notice nausea, stomach pain, change in the color of stools --->  Stop the medicine and let us know   For cough:  Take Mucinex DM twice a day as needed until better  For nasal congestion: Use OTC   Flonase : 2 nasal sprays on each side of the nose in the morning until you feel better    Take TAMIFLU   Influenza can be much worse than a  cold, please call us if you are not feeling better within the next few days or if you get worse.

## 2018-08-16 ENCOUNTER — Emergency Department (HOSPITAL_BASED_OUTPATIENT_CLINIC_OR_DEPARTMENT_OTHER): Payer: Self-pay

## 2018-08-16 ENCOUNTER — Other Ambulatory Visit: Payer: Self-pay

## 2018-08-16 ENCOUNTER — Emergency Department (HOSPITAL_BASED_OUTPATIENT_CLINIC_OR_DEPARTMENT_OTHER)
Admission: EM | Admit: 2018-08-16 | Discharge: 2018-08-16 | Disposition: A | Discharge status: Home / Self Care | Payer: Self-pay | Attending: Emergency Medicine | Admitting: Emergency Medicine

## 2018-08-16 ENCOUNTER — Encounter (HOSPITAL_BASED_OUTPATIENT_CLINIC_OR_DEPARTMENT_OTHER): Payer: Self-pay | Admitting: Emergency Medicine

## 2018-08-16 DIAGNOSIS — Z7982 Long term (current) use of aspirin: Secondary | ICD-10-CM | POA: Insufficient documentation

## 2018-08-16 DIAGNOSIS — E876 Hypokalemia: Secondary | ICD-10-CM | POA: Insufficient documentation

## 2018-08-16 DIAGNOSIS — E119 Type 2 diabetes mellitus without complications: Secondary | ICD-10-CM | POA: Insufficient documentation

## 2018-08-16 DIAGNOSIS — Z79899 Other long term (current) drug therapy: Secondary | ICD-10-CM | POA: Insufficient documentation

## 2018-08-16 DIAGNOSIS — M62838 Other muscle spasm: Secondary | ICD-10-CM | POA: Insufficient documentation

## 2018-08-16 DIAGNOSIS — I10 Essential (primary) hypertension: Secondary | ICD-10-CM | POA: Insufficient documentation

## 2018-08-16 DIAGNOSIS — E039 Hypothyroidism, unspecified: Secondary | ICD-10-CM | POA: Insufficient documentation

## 2018-08-16 DIAGNOSIS — M25511 Pain in right shoulder: Secondary | ICD-10-CM

## 2018-08-16 LAB — COMPREHENSIVE METABOLIC PANEL
ALT: 34 U/L (ref 0–44)
ANION GAP: 9 (ref 5–15)
AST: 25 U/L (ref 15–41)
Albumin: 4 g/dL (ref 3.5–5.0)
Alkaline Phosphatase: 40 U/L (ref 38–126)
BUN: 12 mg/dL (ref 6–20)
CO2: 25 mmol/L (ref 22–32)
Calcium: 9 mg/dL (ref 8.9–10.3)
Chloride: 105 mmol/L (ref 98–111)
Creatinine, Ser: 0.58 mg/dL (ref 0.44–1.00)
GFR calc Af Amer: >60 mL/min (ref >60)
GFR calc non Af Amer: >60 mL/min (ref >60)
Glucose, Bld: 202 mg/dL — ABNORMAL HIGH (ref 70–99)
Potassium: 3 mmol/L — ABNORMAL LOW (ref 3.5–5.1)
Sodium: 139 mmol/L (ref 135–145)
TOTAL PROTEIN: 7.2 g/dL (ref 6.5–8.1)
Total Bilirubin: 0.5 mg/dL (ref 0.3–1.2)

## 2018-08-16 LAB — CBC WITH DIFFERENTIAL/PLATELET
Abs Immature Granulocytes: 0.02 10*3/uL (ref 0.00–0.07)
Basophils Absolute: 0 10*3/uL (ref 0.0–0.1)
Basophils Relative: 1 %
EOS ABS: 0.1 10*3/uL (ref 0.0–0.5)
Eosinophils Relative: 2 %
HCT: 38.7 % (ref 36.0–46.0)
Hemoglobin: 12.1 g/dL (ref 12.0–15.0)
Immature Granulocytes: 0 %
Lymphocytes Relative: 61 %
Lymphs Abs: 3.5 10*3/uL (ref 0.7–4.0)
MCH: 26.9 pg (ref 26.0–34.0)
MCHC: 31.3 g/dL (ref 30.0–36.0)
MCV: 86 fL (ref 80.0–100.0)
Monocytes Absolute: 0.4 10*3/uL (ref 0.1–1.0)
Monocytes Relative: 7 %
Neutro Abs: 1.7 10*3/uL (ref 1.7–7.7)
Neutrophils Relative %: 29 %
Platelets: 254 10*3/uL (ref 150–400)
RBC: 4.5 MIL/uL (ref 3.87–5.11)
RDW: 13.2 % (ref 11.5–15.5)
WBC: 5.8 10*3/uL (ref 4.0–10.5)
nRBC: 0 % (ref 0.0–0.2)

## 2018-08-16 LAB — MAGNESIUM: Magnesium: 1.7 mg/dL (ref 1.7–2.4)

## 2018-08-16 MED ORDER — POTASSIUM CHLORIDE CRYS ER 20 MEQ PO TBCR
40.0000 meq | EXTENDED_RELEASE_TABLET | Freq: Once | ORAL | Status: AC
  Administered 2018-08-16: 40 meq via ORAL
  Filled 2018-08-16: qty 2

## 2018-08-16 MED ORDER — CYCLOBENZAPRINE HCL 10 MG PO TABS: 10.0000 mg | ORAL_TABLET | Freq: Once | ORAL | Status: DC

## 2018-08-16 MED ORDER — CYCLOBENZAPRINE HCL 10 MG PO TABS: 10.0000 mg | ORAL_TABLET | Freq: Two times a day (BID) | ORAL | 0 refills | Status: DC | PRN

## 2018-08-16 NOTE — Discharge Instructions (Signed)
Your work-up today was overall reassuring.  Your exam was consistent with muscle spasms and cramps with pain in your back, shoulder, and arm.  Your images were reassuring and your labs showed low potassium likely contributing to the cramping.  We repleted your potassium orally.  Please improve your diet for potassium rich foods.  Please use the muscle relaxant to help with your muscle spasms and follow-up with your PCP.  If any symptoms change or worsen, please return to the nearest emergency department.

## 2018-08-16 NOTE — ED Triage Notes (Signed)
Patient states that she is having pain to her right shoulder neck and back. She was seen for "flu like" symptoms on 08/12/2018

## 2018-08-16 NOTE — ED Provider Notes (Signed)
[facility] EMERGENC[facility] DEPARTMENT Provider Note   CSN: [id number] Arrival date & time: 08/16/18  2021    History   Chief Complaint Chief Complaint  Patient presents with  . Chest Pain    HPI Felicia Solis is a 53 y.o. female.     The history is provided by the patient and medical records. No language interpreter was used.  Shoulder Pain  Location:  Clavicle and shoulder Clavicle location:  R clavicle Shoulder location:  R shoulder Injury: no   Pain details:    Quality:  Aching, cramping and dull   Radiates to:  R shoulder   Severity:  Severe   Onset quality:  Gradual   Timing:  Constant   Progression:  Unchanged Handedness:  Right-handed Dislocation: no   Tetanus status:  Unknown Prior injury to area:  No Relieved by:  Nothing Worsened by:  Nothing Ineffective treatments:  None tried Associated symptoms: back pain   Associated symptoms: no fatigue, no fever, no muscle weakness, no neck pain, no numbness, no stiffness, no swelling and no tingling     Past Medical History:  Diagnosis Date  . Anemia    anemia  when pregnant in 2002  . Anxiety   . Arthritis    "knees and back" (09/28/2016)  . Asthma 5 yrs ago    told she had asthma.. does not remember doctors name.   . Chronic lower back pain   . Depression    takes citalopram  . History of hiatal hernia   . History of kidney stones   . Hyperlipidemia   . Hypothyroidism   . LBBB (left bundle branch block)    rate-related; identified on stress test 03/21/16  . Migraine    "weekly" (09/28/2016)  . 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 Shortness of breath    with exertion  . Thyroid disease   . Type II diabetes mellitus ([place]) dx'd 2016    Patient Active Problem List   Diagnosis Date Noted  . Myalgia 03/18/2018  . Hyperlipidemia associated with type 2 diabetes mellitus ([place]) 03/18/2018  . Nausea and vomiting 12/02/2017  . Hematuria  12/02/2017  . Pansinusitis 11/19/2017  . 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 Preventative health care 09/27/2017  . Hyperlipidemia LDL goal <100 09/18/2017  . Vitamin D deficiency 09/18/2017  . Hypothyroidism 09/18/2017  . Other intervertebral disc degeneration, lumbar region 04/07/2017  . Left hip pain 04/07/2017  . Hyperlipidemia LDL goal <70 04/07/2017  . Essential hypertension 04/07/2017  . Acute vaginitis 04/07/2017  . S/P complete thyroidectomy 09/28/2016  . Memory loss 08/23/2016  . Thyroid nodule 08/02/2016  . Plantar fasciitis of right foot 12/01/2015  . Knee pain, right 12/01/2015  . Headache, migraine 09/06/2015  . Bilateral knee pain 08/22/2015  . Thoracic myofascial strain 08/22/2015  . Dysphagia 04/10/2015  . Chest pain 04/10/2015  . Epigastric pain 04/10/2015  . Functional diarrhea 04/10/2015  . NSAID long-term use 04/10/2015  . Midepigastric pain 03/22/2015  . Cough 01/07/2015  . Allergic rhinitis 12/13/2014  . Acute bronchitis 12/13/2014  . Blood in stool 10/01/2014  . DM (diabetes mellitus) type II uncontrolled, periph vascular disorder ([place]) 08/13/2014  . Abnormal ECG 05/14/2012  . Breast pain 10/23/2011  . ASCUS on Pap smear 10/08/2011  . H/O thyroid nodule 08/27/2011  . Depression with anxiety 08/27/2011  . Heavy periods 08/27/2011    Past Surgical  History:  Procedure Laterality Date  . DILATION AND CURETTAGE OF UTERUS  1999  . ENDOMETRIAL ABLATION    . LAPAROSCOPY     years ago ..pt does not know where surgery was done ... states" my stomach would blow up then go down"  . THYROIDECTOMY  09/28/2011   Procedure: THYROIDECTOMY;  Surgeon: Izora Gala, MD;  Location: [facility];  Service: ENT;  Laterality: Right;  RIGHT THYROIDECTOMY WITH FROZEN SECTION  . THYROIDECTOMY  09/28/2016   completion of thyroidectomy/notes 09/28/2016  . THYROIDECTOMY Left 09/28/2016   Procedure: COMPLETION OF THYROIDECTOMY;  Surgeon: Izora Gala, MD;  Location: [facility];   Service: ENT;  Laterality: Left;  . TUBAL LIGATION       OB History    Gravida  5   Para  3   Term  3   Preterm      AB  2   Living  3     SAB  2   TAB      Ectopic      Multiple      Live Births               Home Medications    Prior to Admission medications   Medication Sig Start Date End Date Taking? Authorizing Provider  acetaminophen (TYLENOL) 500 MG tablet Take 1,000 mg by mouth every 6 (six) hours as needed for mild pain.    [provider]  ALPRAZolam Duanne Moron) 0.5 MG tablet Take 1 tablet (0.5 mg total) by mouth 3 (three) times daily as needed for anxiety. 11/19/17   [name], DO  aspirin EC 81 MG tablet Take 1 tablet (81 mg total) by mouth daily. 03/07/16   End, Harrell Gave, MD  blood glucose meter kit and supplies KIT Dispense based on patient and insurance preference. Use up to four times daily as directed. (FOR ICD-9 250.00, 250.01). 09/17/17   Carollee Herter, Alferd Apa, DO  Cholecalciferol (VITAMIN D3) 2000 units TABS Take 2,000 Units by mouth daily.     [provider]  citalopram (CELEXA) 20 MG tablet TAKE 1 TABLET BY MOUTH ONCE DAILY 08/12/18   Carollee Herter, Alferd Apa, DO  cyanocobalamin 500 MCG tablet Take 500 mcg by mouth daily.    [provider]  cyclobenzaprine (FLEXERIL) 10 MG tablet Take 1 tablet (10 mg total) by mouth at bedtime. Patient taking differently: Take 10 mg by mouth daily as needed for muscle spasms.  08/21/16   [name], DO  diclofenac (VOLTAREN) 75 MG EC tablet  08/05/17   [provider]  Flaxseed, Linseed, (FLAXSEED OIL PO) Take 1,300 mg by mouth daily.    [provider]  fluticasone (FLONASE) 50 MCG/ACT nasal spray Place 2 sprays into both nostrils daily. 11/19/17   [name], DO  Ginkgo Biloba Extract 60 MG CAPS Take 60 mg by mouth daily.    [provider]  glimepiride (AMARYL) 2 MG tablet Take 1 tablet (2 mg total) by mouth daily before  breakfast. 04/04/18   Carollee Herter, Alferd Apa, DO  glucose blood (ONETOUCH VERIO) test strip [facility] Check blood sugar twice daily 05/27/17   Carollee Herter, Alferd Apa, DO  HYDROcodone-homatropine (HYCODAN) 5-1.5 MG/5ML syrup Take 5 mLs by mouth at bedtime as needed for cough. 08/12/18   Colon Branch, MD  ibuprofen (ADVIL,MOTRIN) 600 MG tablet Take 1 tablet (600 mg total) by mouth every 6 (six) hours as needed for mild pain. 05/14/18  Robinson, [place] N, PA-C  levothyroxine (SYNTHROID) 100 MCG tablet Take 1 tablet (100 mcg total) by mouth daily. 09/28/16   Izora Gala, MD  lidocaine (LIDODERM) 5 % Place 1 patch onto the skin daily. Remove & Discard patch within 12 hours or as directed by MD 07/14/18   Elie Goody, Caitlyn S, PA-C  losartan (COZAAR) 25 MG tablet TAKE 1 TABLET BY MOUTH ONCE DAILY 12/09/17   Carollee Herter, Alferd Apa, DO  methocarbamol (ROBAXIN) 500 MG tablet Take 1 tablet (500 mg total) by mouth 2 (two) times daily. 01/13/17   Larene Pickett, PA-C  naproxen (NAPROSYN) 375 MG tablet Take 1 tablet (375 mg total) by mouth 2 (two) times daily. 07/14/18   Kendrick, Caitlyn S, PA-C  Omega-3 Fatty Acids (FISH OIL) 1200 MG CAPS Take 1,200 mg by mouth daily.    [provider]  ondansetron (ZOFRAN) 8 MG tablet Take 1 tablet (8 mg total) by mouth every 8 (eight) hours as needed for nausea or vomiting. 12/02/17   Carollee Herter, Alferd Apa, DO  [facility] DELICA LANCETS FINE MISC Check blood sugar twice daily 08/26/14   Carollee Herter, Alferd Apa, DO  oseltamivir (TAMIFLU) 75 MG capsule Take 1 capsule (75 mg total) by mouth 2 (two) times daily. 08/12/18   Colon Branch, MD  pantoprazole (PROTONIX) 40 MG tablet Take 1 tablet (40 mg total) by mouth daily. 09/27/17   Roma Schanz R, DO  rosuvastatin (CRESTOR) 20 MG tablet TAKE 1 TABLET BY MOUTH ONCE DAILY 02/04/18   Carollee Herter, Alferd Apa, DO  sitaGLIPtin-metformin (JANUMET) 50-1000 MG tablet Take 2 tablets by mouth daily with breakfast. 07/10/18   Carollee Herter, Alferd Apa, DO  traMADol (ULTRAM) 50 MG tablet Take 1 tablet (50 mg total) by mouth every 8 (eight) hours as needed. 03/18/18   [name], DO    Family History Family History  Problem Relation Age of Onset  . Diabetes Mother   . Hypertension Mother   . Hyperlipidemia Mother   . Gallbladder disease Mother   . Heart disease Mother   . Diabetes Sister   . Hypertension Father   . Hyperlipidemia Father   . Colon polyps Father   . Ovarian cancer Maternal Grandmother   . Gallbladder disease Maternal Grandmother   . Colon cancer Paternal Grandfather   . Breast cancer Maternal Aunt   . Heart disease Other        early cad  . Diabetes Brother   . Heart attack Brother   . Anesthesia problems Neg Hx   . Hypotension Neg Hx   . Malignant hyperthermia Neg Hx   . Pseudochol deficiency Neg Hx   . Esophageal cancer Neg Hx     Social History Social History   Tobacco Use  . Smoking status: Never Smoker  . Smokeless tobacco: Never Used  Substance Use Topics  . Alcohol use: No    Alcohol/week: 0.0 standard drinks  . Drug use: No     Allergies   Lisinopril   Review of Systems Review of Systems  Constitutional: Negative for chills, diaphoresis, fatigue and fever.  HENT: Negative for congestion.   Respiratory: Negative for cough, chest tightness, shortness of breath and wheezing.   Cardiovascular: Negative for chest pain and palpitations.  Gastrointestinal: Negative for abdominal pain, constipation, diarrhea, nausea and vomiting.  Genitourinary: Negative for dysuria.  Musculoskeletal: Positive for back pain. Negative for joint swelling, neck pain, neck stiffness and stiffness.  Skin: Negative for rash  and wound.  Neurological: Negative for light-headedness and headaches.  Psychiatric/Behavioral: Negative for agitation.  All other systems reviewed and are negative.    Physical Exam Updated Vital Signs BP (!) 158/78 (BP Location: Right Arm)   Pulse 75   Temp 98.2 F (36.8  C) (Oral)   Resp 17   Ht '5\' 6"'$  (1.676 m)   Wt 103.9 kg   SpO2 99%   BMI 36.97 kg/m   Physical Exam Vitals signs and nursing note reviewed.  Constitutional:      General: She is not in acute distress.    Appearance: She is well-developed.  HENT:     Head: Normocephalic and atraumatic.  Eyes:     Conjunctiva/sclera: Conjunctivae normal.     Pupils: Pupils are equal, round, and reactive to light.  Neck:     Musculoskeletal: Normal range of motion and neck supple.  Cardiovascular:     Rate and Rhythm: Normal rate and regular rhythm.     Heart sounds: No murmur.  Pulmonary:     Effort: Pulmonary effort is normal. No respiratory distress.     Breath sounds: Normal breath sounds. No decreased breath sounds, wheezing, rhonchi or rales.  Chest:     Chest wall: No tenderness.  Abdominal:     Palpations: Abdomen is soft.     Tenderness: There is no abdominal tenderness.  Musculoskeletal: Normal range of motion.     Right shoulder: She exhibits tenderness, pain and spasm. She exhibits no deformity and no laceration.       Arms:     Right lower leg: She exhibits no tenderness. No edema.     Left lower leg: She exhibits no tenderness. No edema.     Comments: Normal grip strength, sensation, pulses in extremities.  Palpable muscle spasms and muscle tenderness in shoulder and upper torso.  Lungs clear chest nontender.  Patient appears well.  Skin:    General: Skin is warm and dry.     Capillary Refill: Capillary refill takes less than 2 seconds.     Findings: No rash.  Neurological:     General: No focal deficit present.     Mental Status: She is alert.  Psychiatric:        Mood and Affect: Mood is not anxious.      ED Treatments / Results  Labs (all labs ordered are listed, but only abnormal results are displayed) Labs Reviewed  COMPREHENSIVE METABOLIC PANEL - Abnormal; Notable for the following components:      Result Value   Potassium 3.0 (*)    Glucose, Bld 202 (*)     All other components within normal limits  CBC WITH DIFFERENTIAL/PLATELET  MAGNESIUM    EKG EKG Interpretation  Date/Time:  Saturday August 16 2018 20:32:24 EST Ventricular Rate:  81 PR Interval:  158 QRS Duration: 80 QT Interval:  372 QTC Calculation: 432 R Axis:   98 Text Interpretation:  Normal sinus rhythm Rightward axis Borderline ECG When compared to prior, no significant changes seen.,  NO STEMI Confirmed by Antony Blackbird [phone]) on 08/16/2018 8:43:52 PM   Radiology Dg Chest 2 View  Result Date: 08/16/2018 CLINICAL DATA:  Right shoulder pain EXAM: CHEST - 2 VIEW COMPARISON:  08/12/2018 FINDINGS: The heart size and mediastinal contours are within normal limits. Both lungs are clear. The visualized skeletal structures are unremarkable. IMPRESSION: No active cardiopulmonary disease. Electronically Signed   By: Donavan Foil M.D.   On: 08/16/2018 22:08   Dg Shoulder  Right  Result Date: 08/16/2018 CLINICAL DATA:  Shoulder pain EXAM: RIGHT SHOULDER - 2+ VIEW COMPARISON:  None. FINDINGS: There is no evidence of fracture or dislocation. There is no evidence of arthropathy or other focal bone abnormality. Soft tissues are unremarkable. IMPRESSION: Negative. Electronically Signed   By: Donavan Foil M.D.   On: 08/16/2018 22:09    Procedures Procedures (including critical care time)  Medications Ordered in ED Medications  cyclobenzaprine (FLEXERIL) tablet 10 mg (10 mg Oral Not Given 08/16/18 2314)  potassium chloride SA (K-DUR,KLOR-CON) CR tablet 40 mEq (40 mEq Oral Given 08/16/18 2310)     Initial Impression / Assessment and Plan / ED Course  I have reviewed the triage vital signs and the nursing notes.  Pertinent labs & imaging results that were available during my care of the patient were reviewed by me and considered in my medical decision making (see chart for details).        Felicia Solis is a 53 y.o. female with a past medical history significant for depression,  anxiety, diabetes, hypertension, hyperlipidemia, hypothyroidism, kidney stone, and asthma who presents with right shoulder pain and concern for muscle spasms.  Patient reports recent URI with coughing and reports that for the last few days she has been having pain in her right upper back rating into her right shoulder.  She reports she is had muscle cramps and spasms in the past but has not had any this persistent.  She reports her cough has improved.  She denies nausea, vomiting, urinary symptoms or GI symptoms.  She denies recent trauma.  No numbness, tingling, weakness of extremities.  No chest pain reported.  On exam, patient with muscle spasms in her clavicle area and on her back.  Tenderness present in her shoulder.  Normal shoulder range of motion, grip strength, sensation, and pulses.  Normal capillary refill.  No other chest tenderness.  Lungs clear and no murmur.  Abdomen nontender.  Patient had reassuring vital signs on arrival.  Clinically suspect muscle spasms contributing symptoms.  Due to the knots and muscle spasm palpated, patient will have electrolytes checked to look for imbalance.  Patient found to have hypokalemia with a potassium of 3.0.  Oral potassium was supplemented.  Patient had x-rays of her shoulder and chest showing no fracture or dislocation.  Extremely low suspicion for PE given the palpable muscle spasms and history.  No pneumonia seen given her recent URI and the pain.  Other work-up reassuring.  Patient given prescription for muscle relaxant and will follow-up with PCP.  Patient understood return precautions and follow-up instructions.  Patient and other questions or concerns and was discharged in good condition.    Final Clinical Impressions(s) / ED Diagnoses   Final diagnoses:  Muscle spasm  Acute pain of right shoulder  Hypokalemia    ED Discharge Orders         Ordered    cyclobenzaprine (FLEXERIL) 10 MG tablet  2 times daily PRN     08/16/18 2310            Clinical Impression: 1. Muscle spasm   2. Acute pain of right shoulder   3. Hypokalemia     Disposition: Discharge  Condition: Good  I have discussed the results, Dx and Tx plan with the pt(& family if present). He/she/they expressed understanding and agree(s) with the plan. Discharge instructions discussed at great length. Strict return precautions discussed and pt &/or family have verbalized understanding of the instructions. No further  questions at time of discharge.    Discharge Medication List as of 08/16/2018 11:11 PM    START taking these medications   Details  !! cyclobenzaprine (FLEXERIL) 10 MG tablet Take 1 tablet (10 mg total) by mouth 2 (two) times daily as needed for muscle spasms., Starting Sat 08/16/2018, Print     !! - Potential duplicate medications found. Please discuss with provider.      Follow Up: [name], DO [address] [place] [address] [phone]     [facility] [place] [facility] [address] [id number] [facility] [place] [phone]       , Gwenyth Allegra, MD 08/17/18 [phone]

## 2018-08-16 NOTE — ED Notes (Signed)
Patient transported to X-ray 

## 2018-08-28 ENCOUNTER — Ambulatory Visit (HOSPITAL_BASED_OUTPATIENT_CLINIC_OR_DEPARTMENT_OTHER)
Admission: RE | Admit: 2018-08-28 | Discharge: 2018-08-28 | Disposition: A | Discharge status: Home / Self Care | Payer: Self-pay | Source: Ambulatory Visit | Attending: Family Medicine | Admitting: Family Medicine

## 2018-08-28 ENCOUNTER — Encounter: Payer: Self-pay | Admitting: Family Medicine

## 2018-08-28 ENCOUNTER — Ambulatory Visit: Payer: Self-pay | Admitting: Family Medicine

## 2018-08-28 VITALS — BP 138/88 | HR 97 | Ht 66.0 in | Wt 232.0 lb

## 2018-08-28 DIAGNOSIS — M79601 Pain in right arm: Secondary | ICD-10-CM | POA: Insufficient documentation

## 2018-08-28 DIAGNOSIS — E785 Hyperlipidemia, unspecified: Secondary | ICD-10-CM

## 2018-08-28 DIAGNOSIS — E1165 Type 2 diabetes mellitus with hyperglycemia: Secondary | ICD-10-CM

## 2018-08-28 DIAGNOSIS — E1151 Type 2 diabetes mellitus with diabetic peripheral angiopathy without gangrene: Secondary | ICD-10-CM

## 2018-08-28 DIAGNOSIS — K219 Gastro-esophageal reflux disease without esophagitis: Secondary | ICD-10-CM

## 2018-08-28 DIAGNOSIS — R600 Localized edema: Secondary | ICD-10-CM

## 2018-08-28 DIAGNOSIS — IMO0002 Reserved for concepts with insufficient information to code with codable children: Secondary | ICD-10-CM

## 2018-08-28 DIAGNOSIS — F411 Generalized anxiety disorder: Secondary | ICD-10-CM

## 2018-08-28 DIAGNOSIS — M25511 Pain in right shoulder: Secondary | ICD-10-CM

## 2018-08-28 LAB — GLUCOSE, POCT (MANUAL RESULT ENTRY): POC Glucose: 161 mg/dl — AB (ref 70–99)

## 2018-08-28 MED ORDER — PREDNISONE 10 MG PO TABS: ORAL_TABLET | ORAL | 0 refills | Status: DC

## 2018-08-28 MED ORDER — ROSUVASTATIN CALCIUM 20 MG PO TABS: 20.0000 mg | ORAL_TABLET | Freq: Every day | ORAL | 1 refills | Status: DC

## 2018-08-28 MED ORDER — CITALOPRAM HYDROBROMIDE 20 MG PO TABS: 20.0000 mg | ORAL_TABLET | Freq: Every day | ORAL | 3 refills | Status: DC

## 2018-08-28 MED ORDER — HYDROCHLOROTHIAZIDE 25 MG PO TABS: 25.0000 mg | ORAL_TABLET | Freq: Every day | ORAL | 2 refills | Status: DC

## 2018-08-28 MED ORDER — METHOCARBAMOL 500 MG PO TABS: 500.0000 mg | ORAL_TABLET | Freq: Four times a day (QID) | ORAL | 0 refills | Status: DC | PRN

## 2018-08-28 MED ORDER — PANTOPRAZOLE SODIUM 40 MG PO TBEC: 40.0000 mg | DELAYED_RELEASE_TABLET | Freq: Every day | ORAL | 3 refills | Status: DC

## 2018-08-28 NOTE — Progress Notes (Signed)
Subjective:    Felicia Solis is a 53 y.o. female who presents with right shoulder pain. The symptoms began several months ago. Aggravating factors: no known event. Pain is located between the neck and shoulder and and upper R arm ----- make entire arm hurt /throb. Discomfort is described as aching and throbbing. Symptoms are exacerbated by repetitive movements. Evaluation to date: none. Therapy to date includes: OTC analgesics which are ineffective.  The following portions of the patient's history were reviewed and updated as appropriate: She  has a past medical history of Anemia, Anxiety, Arthritis, Asthma (5 yrs ago ), Chronic lower back pain, Depression, History of hiatal hernia, History of kidney stones, Hyperlipidemia, Hypothyroidism, LBBB (left bundle branch block), Migraine, Positive TB test (2008), Shortness of breath, Thyroid disease, and Type II diabetes mellitus ([place]) (dx'd 2016). She does not have any pertinent problems on file. She  has a past surgical history that includes Endometrial ablation; Tubal ligation; laparoscopy; Dilation and curettage of uterus (1999); Thyroidectomy (09/28/2011); Thyroidectomy (09/28/2016); and Thyroidectomy (Left, 09/28/2016). Her family history includes Breast cancer in her maternal aunt; Breast cancer (age of onset: 26) in her maternal grandmother; Colon cancer in her paternal grandfather; Colon polyps in her father; Diabetes in her brother, mother, and sister; Gallbladder disease in her maternal grandmother and mother; Heart attack in her brother; Heart disease in her mother and another family member; Hyperlipidemia in her father and mother; Hypertension in her father and mother; Ovarian cancer in her maternal grandmother. She  reports that she has never smoked. She has never used smokeless tobacco. She reports that she does not drink alcohol or use drugs. She has a current medication list which includes the following prescription(s): acetaminophen, alprazolam,  aspirin ec, blood glucose meter kit and supplies, vitamin d3, citalopram, vitamin b-12, cyclobenzaprine, cyclobenzaprine, diclofenac, flaxseed (linseed), fluticasone, ginkgo biloba extract, glimepiride, glucose blood, hydrocodone-homatropine, ibuprofen, levothyroxine, lidocaine, losartan, methocarbamol, naproxen, fish oil, ondansetron, onetouch delica lancets fine, oseltamivir, pantoprazole, rosuvastatin, sitagliptin-metformin, tramadol, hydrochlorothiazide, methocarbamol, and prednisone. Current Outpatient Medications on File Prior to Visit  Medication Sig Dispense Refill  . acetaminophen (TYLENOL) 500 MG tablet Take 1,000 mg by mouth every 6 (six) hours as needed for mild pain.    Marland Kitchen ALPRAZolam (XANAX) 0.5 MG tablet Take 1 tablet (0.5 mg total) by mouth 3 (three) times daily as needed for anxiety. 10 tablet 0  . aspirin EC 81 MG tablet Take 1 tablet (81 mg total) by mouth daily. 90 tablet 3  . blood glucose meter kit and supplies KIT Dispense based on patient and insurance preference. Use up to four times daily as directed. (FOR ICD-9 250.00, 250.01). 1 each 0  . Cholecalciferol (VITAMIN D3) 2000 units TABS Take 2,000 Units by mouth daily.     . cyanocobalamin 500 MCG tablet Take 500 mcg by mouth daily.    . cyclobenzaprine (FLEXERIL) 10 MG tablet Take 1 tablet (10 mg total) by mouth at bedtime. (Patient taking differently: Take 10 mg by mouth daily as needed for muscle spasms. ) 10 tablet 0  . cyclobenzaprine (FLEXERIL) 10 MG tablet Take 1 tablet (10 mg total) by mouth 2 (two) times daily as needed for muscle spasms. 20 tablet 0  . diclofenac (VOLTAREN) 75 MG EC tablet   0  . Flaxseed, Linseed, (FLAXSEED OIL PO) Take 1,300 mg by mouth daily.    . fluticasone (FLONASE) 50 MCG/ACT nasal spray Place 2 sprays into both nostrils daily. 16 g 6  . Ginkgo Biloba Extract 60 MG  CAPS Take 60 mg by mouth daily.    Marland Kitchen glimepiride (AMARYL) 2 MG tablet Take 1 tablet (2 mg total) by mouth daily before breakfast. 30  tablet 3  . glucose blood (ONETOUCH VERIO) test strip Onetouch Verio Check blood sugar twice daily 100 each 12  . HYDROcodone-homatropine (HYCODAN) 5-1.5 MG/5ML syrup Take 5 mLs by mouth at bedtime as needed for cough. 120 mL 0  . ibuprofen (ADVIL,MOTRIN) 600 MG tablet Take 1 tablet (600 mg total) by mouth every 6 (six) hours as needed for mild pain. 30 tablet 0  . levothyroxine (SYNTHROID) 100 MCG tablet Take 1 tablet (100 mcg total) by mouth daily. 30 tablet 6  . lidocaine (LIDODERM) 5 % Place 1 patch onto the skin daily. Remove & Discard patch within 12 hours or as directed by MD 10 patch 0  . losartan (COZAAR) 25 MG tablet TAKE 1 TABLET BY MOUTH ONCE DAILY 90 tablet 1  . methocarbamol (ROBAXIN) 500 MG tablet Take 1 tablet (500 mg total) by mouth 2 (two) times daily. 20 tablet 0  . naproxen (NAPROSYN) 375 MG tablet Take 1 tablet (375 mg total) by mouth 2 (two) times daily. 20 tablet 0  . Omega-3 Fatty Acids (FISH OIL) 1200 MG CAPS Take 1,200 mg by mouth daily.    . ondansetron (ZOFRAN) 8 MG tablet Take 1 tablet (8 mg total) by mouth every 8 (eight) hours as needed for nausea or vomiting. 20 tablet 0  . ONETOUCH DELICA LANCETS FINE MISC Check blood sugar twice daily 100 each 12  . oseltamivir (TAMIFLU) 75 MG capsule Take 1 capsule (75 mg total) by mouth 2 (two) times daily. 10 capsule 0  . sitaGLIPtin-metformin (JANUMET) 50-1000 MG tablet Take 2 tablets by mouth daily with breakfast. 60 tablet 2  . traMADol (ULTRAM) 50 MG tablet Take 1 tablet (50 mg total) by mouth every 8 (eight) hours as needed. 20 tablet 0   No current facility-administered medications on file prior to visit.    She is allergic to lisinopril..  Review of Systems Pertinent items are noted in HPI.   Objective:    BP 138/88   Pulse 97   Ht '5\' 6"'$  (1.676 m)   Wt 232 lb (105.2 kg)   SpO2 98%   BMI 37.45 kg/m  Right shoulder: full ROM, sensory exam normal, motor exam normal and tenderness upper arm ant , no swelling ,  no errhthema   Left shoulder: normal active ROM, no tenderness, no impingement sign    General appearance: alert, cooperative and no distress Neck: no adenopathy, no carotid bruit, no JVD, supple, symmetrical, trachea midline and thyroid not enlarged, symmetric, no tenderness/mass/nodules Lungs: clear to auscultation bilaterally Heart: regular rate and rhythm, S1, S2 normal, no murmur, click, rub or gallop Extremities: edema tr pitting edema R low ext  Assessment:    Right shoulder strain    Plan:    Neurosurgeon distributed. Reduction in offending activity. Rest, ice, compression, and elevation (RICE) therapy. NSAIDs per medication orders. Orthopedics referral.

## 2018-08-28 NOTE — Patient Instructions (Addendum)
Shoulder Pain Many things can cause shoulder pain, including:  An injury to the shoulder.  Overuse of the shoulder.  Arthritis. The source of the pain can be:  Inflammation.  An injury to the shoulder joint.  An injury to a tendon, ligament, or bone. Follow these instructions at home: Pay attention to changes in your symptoms. Let your health care provider know about them. Follow these instructions to relieve your pain. If you have a sling:  Wear the sling as told by your health care provider. Remove it only as told by your health care provider.  Loosen the sling if your fingers tingle, become numb, or turn cold and blue.  Keep the sling clean.  If the sling is not waterproof: ? Do not let it get wet. Remove it to shower or bathe.  Move your arm as little as possible, but keep your hand moving to prevent swelling. Managing pain, stiffness, and swelling   If directed, put ice on the painful area: ? Put ice in a plastic bag. ? Place a towel between your skin and the bag. ? Leave the ice on for 20 minutes, 2-3 times per day. Stop applying ice if it does not help with the pain.  Squeeze a soft ball or a foam pad as much as possible. This helps to keep the shoulder from swelling. It also helps to strengthen the arm. General instructions  Take over-the-counter and prescription medicines only as told by your health care provider.  Keep all follow-up visits as told by your health care provider. This is important. Contact a health care provider if:  Your pain gets worse.  Your pain is not relieved with medicines.  New pain develops in your arm, hand, or fingers. Get help right away if:  Your arm, hand, or fingers: ? Tingle. ? Become numb. ? Become swollen. ? Become painful. ? Turn white or blue. Summary  Shoulder pain can be caused by an injury, overuse, or arthritis.  Pay attention to changes in your symptoms. Let your health care provider know about  them.  This condition may be treated with a sling, ice, and pain medicines.  Contact your health care provider if the pain gets worse or new pain develops. Get help right away if your arm, hand, or fingers tingle or become numb, swollen, or painful.  Keep all follow-up visits as told by your health care provider. This is important. This information is not intended to replace advice given to you by your health care provider. Make sure you discuss any questions you have with your health care provider. Document Released: 03/21/2005 Document Revised: 12/24/2017 Document Reviewed: 12/24/2017 Elsevier Interactive Patient Education  2019 [employer]     Edema  Edema is an abnormal buildup of fluids in the body tissues and under the skin. Swelling of the legs, feet, and ankles is a common symptom that becomes more likely as you get older. Swelling is also common in looser tissues, like around the eyes. When the affected area is squeezed, the fluid may move out of that spot and leave a dent for a few moments. This dent is called pitting edema. There are many possible causes of edema. Eating too much salt (sodium) and being on your feet or sitting for a long time can cause edema in your legs, feet, and ankles. Hot weather may make edema worse. Common causes of edema include:  Heart failure.  Liver or kidney disease.  Weak leg blood vessels.  Cancer.  An injury.  Pregnancy.  Medicines.  Being obese.  Low protein levels in the blood. Edema is usually painless. Your skin may look swollen or shiny. Follow these instructions at home:  Keep the affected body part raised (elevated) above the level of your heart when you are sitting or lying down.  Do not sit still or stand for long periods of time.  Do not wear tight clothing. Do not wear garters on your upper legs.  Exercise your legs to get your circulation going. This helps to move the fluid back into your blood vessels, and it may  help the swelling go down.  Wear elastic bandages or support stockings to reduce swelling as told by your health care provider.  Eat a low-salt (low-sodium) diet to reduce fluid as told by your health care provider.  Depending on the cause of your swelling, you may need to limit how much fluid you drink (fluid restriction).  Take over-the-counter and prescription medicines only as told by your health care provider. Contact a health care provider if:  Your edema does not get better with treatment.  You have heart, liver, or kidney disease and have symptoms of edema.  You have sudden and unexplained weight gain. Get help right away if:  You develop shortness of breath or chest pain.  You cannot breathe when you lie down.  You develop pain, redness, or warmth in the swollen areas.  You have heart, liver, or kidney disease and suddenly get edema.  You have a fever and your symptoms suddenly get worse. Summary  Edema is an abnormal buildup of fluids in the body tissues and under the skin.  Eating too much salt (sodium) and being on your feet or sitting for a long time can cause edema in your legs, feet, and ankles.  Keep the affected body part raised (elevated) above the level of your heart when you are sitting or lying down. This information is not intended to replace advice given to you by your health care provider. Make sure you discuss any questions you have with your health care provider. Document Released: 06/11/2005 Document Revised: 07/14/2016 Document Reviewed: 07/14/2016 Elsevier Interactive Patient Education  2019 [employer].

## 2018-08-29 ENCOUNTER — Ambulatory Visit: Payer: Self-pay

## 2018-08-29 ENCOUNTER — Ambulatory Visit
Admission: RE | Admit: 2018-08-29 | Discharge: 2018-08-29 | Disposition: A | Discharge status: Home / Self Care | Payer: PRIVATE HEALTH INSURANCE | Source: Ambulatory Visit | Attending: Family Medicine | Admitting: Family Medicine

## 2018-08-29 DIAGNOSIS — Z1231 Encounter for screening mammogram for malignant neoplasm of breast: Secondary | ICD-10-CM

## 2018-08-29 NOTE — Telephone Encounter (Signed)
Pt called needing advice on frequency of administration of Novolog. Pt was prescribed prednisone and was told to take Novolog. Sliding scale per pt:  200-250: 2 units 251-300:4 units 301-350: 6 units 351-400: 8 units >400 : call PCP.  Pt needs to know frequency for the insulin. Pt's CBG:  0715 this am fasting 236: pt took 2 units Novolog 11:30 am: CBG 248 1:45 pm: CBG 298. Please call pt for frequency clarification: There are no notes and Novolog is not on active med list.  Sending to office    Reason for Disposition . Caller has NON-URGENT medication question about med that PCP prescribed and triager unable to answer question  Answer Assessment - Initial Assessment Questions 1. SYMPTOMS: "Do you have any symptoms?"     no 2. SEVERITY: If symptoms are present, ask "Are they mild, moderate or severe?"     N/a see documentation note.  Protocols used: MEDICATION QUESTION CALL-A-AH

## 2018-09-01 NOTE — Telephone Encounter (Signed)
Left message on machine for patient to call back.

## 2018-09-01 NOTE — Telephone Encounter (Signed)
She was told to take her bs qid -- fasting and 2 hours after meals

## 2018-09-02 ENCOUNTER — Other Ambulatory Visit: Payer: Self-pay | Admitting: Family Medicine

## 2018-09-02 ENCOUNTER — Ambulatory Visit (INDEPENDENT_AMBULATORY_CARE_PROVIDER_SITE_OTHER): Payer: Self-pay | Admitting: Family Medicine

## 2018-09-02 VITALS — BP 132/94 | Ht 66.0 in | Wt 232.0 lb

## 2018-09-02 DIAGNOSIS — R928 Other abnormal and inconclusive findings on diagnostic imaging of breast: Secondary | ICD-10-CM

## 2018-09-02 DIAGNOSIS — M5412 Radiculopathy, cervical region: Secondary | ICD-10-CM

## 2018-09-02 MED ORDER — HYDROCODONE-ACETAMINOPHEN 5-325 MG PO TABS: 1.0000 | ORAL_TABLET | Freq: Four times a day (QID) | ORAL | 0 refills | Status: DC | PRN

## 2018-09-02 MED ORDER — PREDNISONE 10 MG PO TABS: ORAL_TABLET | ORAL | 0 refills | Status: DC

## 2018-09-02 NOTE — Patient Instructions (Signed)
You have cervical radiculopathy (a pinched nerve in the neck). Start extended prednisone dose pack after you finish your current one Methocarbamol from Dr. Etter Sjogren three times a day as needed for muscle spasms (do not drive with this if it makes you sleepy). Norco as needed for severe pain (no driving on this medicine). Consider cervical collar if severely painful. Simple range of motion exercises within limits of pain to prevent further stiffness. Start physical therapy for stretching, exercises, traction, and modalities in [place]. Heat 15 minutes at a time 3-4 times a day to help with spasms. Watch head position when on computers, texting, when sleeping in bed - should in line with back to prevent further nerve traction and irritation. Consider home traction unit if you get benefit with this in physical therapy. If not improving we will consider an MRI. Follow up with me in 1 month but call me sooner (in 1-2 weeks) if you're struggling.

## 2018-09-03 ENCOUNTER — Encounter: Payer: Self-pay | Admitting: Family Medicine

## 2018-09-03 ENCOUNTER — Telehealth (HOSPITAL_COMMUNITY): Payer: Self-pay

## 2018-09-03 NOTE — Telephone Encounter (Signed)
Returned patient's phone call. Left name and number for her to call back. °

## 2018-09-03 NOTE — Progress Notes (Signed)
PCP and consultation requested by: [name], DO  Subjective:   HPI: Patient is a 53 y.o. female here for right shoulder/neck pain.  Patient reports that for about 1 month she has had pain in the posterior aspect of the right shoulder and neck radiating down her right arm into her second through fifth digits. Feels like her fingers and arm are asleep at times. Pain level is a 4 out of 10 and she reports that the prednisone Dosepak from Dr. Etter Sjogren has brought the pain down some. She is also tried Tylenol, ibuprofen, and Flexeril. No prior issues. No bowel or bladder dysfunction.  Past Medical History:  Diagnosis Date  . Anemia    anemia  when pregnant in 2002  . Anxiety   . Arthritis    "knees and back" (09/28/2016)  . Asthma 5 yrs ago    told she had asthma.. does not remember doctors name.   . Chronic lower back pain   . Depression    takes citalopram  . History of hiatal hernia   . History of kidney stones   . Hyperlipidemia   . Hypothyroidism   . LBBB (left bundle branch block)    rate-related; identified on stress test 03/21/16  . Migraine    "weekly" (09/28/2016)  . 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 Shortness of breath    with exertion  . Thyroid disease   . Type II diabetes mellitus ([place]) dx'd 2016    Current Outpatient Medications on File Prior to Visit  Medication Sig Dispense Refill  . acetaminophen (TYLENOL) 500 MG tablet Take 1,000 mg by mouth every 6 (six) hours as needed for mild pain.    Marland Kitchen aspirin EC 81 MG tablet Take 1 tablet (81 mg total) by mouth daily. 90 tablet 3  . blood glucose meter kit and supplies KIT Dispense based on patient and insurance preference. Use up to four times daily as directed. (FOR ICD-9 250.00, 250.01). 1 each 0  . Cholecalciferol (VITAMIN D3) 2000 units TABS Take 2,000 Units by mouth daily.     . citalopram (CELEXA) 20 MG tablet Take 1 tablet (20 mg total) by mouth  daily. 90 tablet 3  . cyanocobalamin 500 MCG tablet Take 500 mcg by mouth daily.    . cyclobenzaprine (FLEXERIL) 10 MG tablet Take 1 tablet (10 mg total) by mouth 2 (two) times daily as needed for muscle spasms. 20 tablet 0  . Flaxseed, Linseed, (FLAXSEED OIL PO) Take 1,300 mg by mouth daily.    . fluticasone (FLONASE) 50 MCG/ACT nasal spray Place 2 sprays into both nostrils daily. 16 g 6  . Ginkgo Biloba Extract 60 MG CAPS Take 60 mg by mouth daily.    Marland Kitchen glimepiride (AMARYL) 2 MG tablet Take 1 tablet (2 mg total) by mouth daily before breakfast. 30 tablet 3  . glucose blood (ONETOUCH VERIO) test strip Onetouch Verio Check blood sugar twice daily 100 each 12  . levothyroxine (SYNTHROID) 100 MCG tablet Take 1 tablet (100 mcg total) by mouth daily. 30 tablet 6  . losartan (COZAAR) 25 MG tablet TAKE 1 TABLET BY MOUTH ONCE DAILY 90 tablet 1  . methocarbamol (ROBAXIN) 500 MG tablet Take 1 tablet (500 mg total) by mouth every 6 (six) hours as needed for muscle spasms. 30 tablet 0  . Omega-3 Fatty Acids (FISH OIL) 1200 MG CAPS Take 1,200 mg by mouth  daily.    Glory Rosebush DELICA LANCETS FINE MISC Check blood sugar twice daily 100 each 12  . pantoprazole (PROTONIX) 40 MG tablet Take 1 tablet (40 mg total) by mouth daily. 90 tablet 3  . rosuvastatin (CRESTOR) 20 MG tablet Take 1 tablet (20 mg total) by mouth daily. 90 tablet 1  . sitaGLIPtin-metformin (JANUMET) 50-1000 MG tablet Take 2 tablets by mouth daily with breakfast. 60 tablet 2  . traMADol (ULTRAM) 50 MG tablet Take 1 tablet (50 mg total) by mouth every 8 (eight) hours as needed. 20 tablet 0  . ALPRAZolam (XANAX) 0.5 MG tablet Take 1 tablet (0.5 mg total) by mouth 3 (three) times daily as needed for anxiety. (Patient not taking: Reported on 09/02/2018) 10 tablet 0  . diclofenac (VOLTAREN) 75 MG EC tablet   0  . hydrochlorothiazide (HYDRODIURIL) 25 MG tablet Take 1 tablet (25 mg total) by mouth daily. (Patient not taking: Reported on 09/02/2018) 30  tablet 2  . HYDROcodone-homatropine (HYCODAN) 5-1.5 MG/5ML syrup Take 5 mLs by mouth at bedtime as needed for cough. (Patient not taking: Reported on 09/02/2018) 120 mL 0  . lidocaine (LIDODERM) 5 % Place 1 patch onto the skin daily. Remove & Discard patch within 12 hours or as directed by MD (Patient not taking: Reported on 09/02/2018) 10 patch 0  . naproxen (NAPROSYN) 375 MG tablet Take 1 tablet (375 mg total) by mouth 2 (two) times daily. (Patient not taking: Reported on 09/02/2018) 20 tablet 0  . ondansetron (ZOFRAN) 8 MG tablet Take 1 tablet (8 mg total) by mouth every 8 (eight) hours as needed for nausea or vomiting. (Patient not taking: Reported on 09/02/2018) 20 tablet 0  . oseltamivir (TAMIFLU) 75 MG capsule Take 1 capsule (75 mg total) by mouth 2 (two) times daily. (Patient not taking: Reported on 09/02/2018) 10 capsule 0   No current facility-administered medications on file prior to visit.     Past Surgical History:  Procedure Laterality Date  . DILATION AND CURETTAGE OF UTERUS  1999  . ENDOMETRIAL ABLATION    . LAPAROSCOPY     years ago ..pt does not know where surgery was done ... states" my stomach would blow up then go down"  . THYROIDECTOMY  09/28/2011   Procedure: THYROIDECTOMY;  Surgeon: Izora Gala, MD;  Location: [facility];  Service: ENT;  Laterality: Right;  RIGHT THYROIDECTOMY WITH FROZEN SECTION  . THYROIDECTOMY  09/28/2016   completion of thyroidectomy/notes 09/28/2016  . THYROIDECTOMY Left 09/28/2016   Procedure: COMPLETION OF THYROIDECTOMY;  Surgeon: Izora Gala, MD;  Location: [facility];  Service: ENT;  Laterality: Left;  . TUBAL LIGATION      Allergies  Allergen Reactions  . Lisinopril Cough    Social History   Socioeconomic History  . Marital status: Married    Spouse name: Not on file  . Number of children: 3  . Years of education: Not on file  . Highest education level: Not on file  Occupational History  . Occupation: CNA  Social Needs  . Financial resource  strain: Not on file  . Food insecurity:    Worry: Not on file    Inability: Not on file  . Transportation needs:    Medical: Not on file    Non-medical: Not on file  Tobacco Use  . Smoking status: Never Smoker  . Smokeless tobacco: Never Used  Substance and Sexual Activity  . Alcohol use: No    Alcohol/week: 0.0 standard drinks  . Drug  use: No  . Sexual activity: Not on file  Lifestyle  . Physical activity:    Days per week: Not on file    Minutes per session: Not on file  . Stress: Not on file  Relationships  . Social connections:    Talks on phone: Not on file    Gets together: Not on file    Attends religious service: Not on file    Active member of club or organization: Not on file    Attends meetings of clubs or organizations: Not on file    Relationship status: Not on file  . Intimate partner violence:    Fear of current or ex partner: Not on file    Emotionally abused: Not on file    Physically abused: Not on file    Forced sexual activity: Not on file  Other Topics Concern  . Not on file  Social History Narrative   No exercise--  Walking and lifting pts at work    Family History  Problem Relation Age of Onset  . Diabetes Mother   . Hypertension Mother   . Hyperlipidemia Mother   . Gallbladder disease Mother   . Heart disease Mother   . Diabetes Sister   . Hypertension Father   . Hyperlipidemia Father   . Colon polyps Father   . Ovarian cancer Maternal Grandmother   . Gallbladder disease Maternal Grandmother   . Breast cancer Maternal Grandmother 35  . Colon cancer Paternal Grandfather   . Breast cancer Maternal Aunt        late 70s  . Heart disease Other        early cad  . Diabetes Brother   . Heart attack Brother   . Anesthesia problems Neg Hx   . Hypotension Neg Hx   . Malignant hyperthermia Neg Hx   . Pseudochol deficiency Neg Hx   . Esophageal cancer Neg Hx     BP (!) 132/94   Ht 5' 6" (1.676 m)   Wt 232 lb (105.2 kg)   BMI 37.45  kg/m   Review of Systems: See HPI above.     Objective:  Physical Exam:  Gen: NAD, comfortable in exam room  Neck: No gross deformity, swelling, bruising. TTP right cervical paraspinal region.  No midline/bony TTP. FROM with pain on lateral rotations. BUE strength 5/5.   Sensation intact to light touch currently.   2+ reflexes in right triceps, biceps, 1+ brachioradialis tendons, 2+ left triceps, biceps, brachioradialis tendons. Negative spurlings. NV intact distal BUEs.  Right shoulder: No swelling, ecchymoses.  No gross deformity. No TTP. FROM. Negative Hawkins, Neers. Negative Yergasons. Strength 5/5 with empty can and resisted internal/external rotation. NV intact distally.   Assessment & Plan:  1. Neck pain -with radiation into right upper extremity consistent with cervical radiculopathy.  She had some benefit with a 6-day Dosepak of prednisone; we will start her on the 12-day extended course of this.  She will take Robaxin as needed for spasms, Norco as needed.  She will start physical therapy and use heat.  We discussed ergonomic issues as well.  Follow-up in 1 month and will consider an MRI if not improving as expected.

## 2018-09-04 ENCOUNTER — Other Ambulatory Visit: Payer: Self-pay

## 2018-09-04 ENCOUNTER — Ambulatory Visit
Admission: RE | Admit: 2018-09-04 | Discharge: 2018-09-04 | Disposition: A | Discharge status: Home / Self Care | Payer: PRIVATE HEALTH INSURANCE | Source: Ambulatory Visit | Attending: Family Medicine | Admitting: Family Medicine

## 2018-09-04 ENCOUNTER — Ambulatory Visit: Payer: PRIVATE HEALTH INSURANCE

## 2018-09-04 DIAGNOSIS — R928 Other abnormal and inconclusive findings on diagnostic imaging of breast: Secondary | ICD-10-CM

## 2018-09-05 ENCOUNTER — Telehealth: Payer: Self-pay | Admitting: *Deleted

## 2018-09-05 NOTE — Telephone Encounter (Signed)
Received Physician Orders from [facility]; forwarded to provider/SLS 03/13  

## 2018-09-10 ENCOUNTER — Encounter: Payer: Self-pay | Admitting: *Deleted

## 2018-09-10 NOTE — Telephone Encounter (Signed)
Mailbox full

## 2018-09-24 ENCOUNTER — Telehealth: Payer: Self-pay

## 2018-09-24 DIAGNOSIS — M5412 Radiculopathy, cervical region: Secondary | ICD-10-CM

## 2018-09-24 NOTE — Telephone Encounter (Signed)
We generally have 3 options:  MRI of cervical spine which is hard to get right now.  Physical therapy - there are still places that are open but obviously there's risk if she's going twice a week.  Third would be to try something like gabapentin 300mg  three times a day.

## 2018-09-25 MED ORDER — GABAPENTIN 300 MG PO CAPS: 300.0000 mg | ORAL_CAPSULE | Freq: Three times a day (TID) | ORAL | 0 refills | Status: DC

## 2018-09-25 NOTE — Telephone Encounter (Signed)
Spoke with the patient and she would like to have [place] put the order in for a cervical MRI. She understands they won't be scheduling this until at least the middle of May. She would also like to start 300 mg gabapentin TID that Dr. Barbaraann Barthel suggested. Order placed for MRI and RX called into pt's pharmacy.

## 2018-10-01 ENCOUNTER — Other Ambulatory Visit: Payer: Self-pay | Admitting: Family Medicine

## 2018-10-01 DIAGNOSIS — I10 Essential (primary) hypertension: Secondary | ICD-10-CM

## 2018-10-07 ENCOUNTER — Other Ambulatory Visit: Payer: Self-pay | Admitting: *Deleted

## 2018-10-07 MED ORDER — GLIMEPIRIDE 2 MG PO TABS: 2.0000 mg | ORAL_TABLET | Freq: Every day | ORAL | 1 refills | Status: DC

## 2018-10-09 ENCOUNTER — Telehealth: Payer: Self-pay | Admitting: *Deleted

## 2018-10-09 NOTE — Telephone Encounter (Signed)
Copied from [place] [id number]. Topic: General - Inquiry >> Oct 08, 2018  4:13 PM Felicia Solis wrote: Reason for CRM: Patient wants to know if anyone can help her with [employer] coupon for sitaGLIPtin-metformin (JANUMET) 50-1000 MG tablet  that she was given before. Patient requesting [employer] callback.

## 2018-10-09 NOTE — Telephone Encounter (Signed)
Attempted to reach pt and left message to return our call. We do have a janumet copay card that we can mail to pt if that is what she is needing.

## 2018-10-10 NOTE — Telephone Encounter (Signed)
Attempted to reach pt and left message to return my call.

## 2018-10-13 NOTE — Telephone Encounter (Signed)
Placed coupon up front for pickup.  Patient notified.

## 2018-10-20 ENCOUNTER — Encounter: Payer: Self-pay | Admitting: Family Medicine

## 2018-10-20 ENCOUNTER — Other Ambulatory Visit: Payer: Self-pay

## 2018-10-20 DIAGNOSIS — N63 Unspecified lump in unspecified breast: Secondary | ICD-10-CM

## 2018-10-20 NOTE — Progress Notes (Deleted)
Virtual Visit via Video Note  I connected with Felicia Solis on 10/20/18 at  3:30 PM EDT by a video enabled telemedicine application and verified that I am speaking with the correct person using two identifiers.   I discussed the limitations of evaluation and management by telemedicine and the availability of in person appointments. The patient expressed understanding and agreed to proceed.  History of Present Illness:    Observations/Objective:   Assessment and Plan:   Follow Up Instructions:    I discussed the assessment and treatment plan with the patient. The patient was provided an opportunity to ask questions and all were answered. The patient agreed with the plan and demonstrated an understanding of the instructions.   The patient was advised to call back or seek an in-person evaluation if the symptoms worsen or if the condition fails to improve as anticipated.  I provided *** minutes of non-face-to-face time during this encounter.   [name], DO

## 2018-10-21 ENCOUNTER — Other Ambulatory Visit: Payer: Self-pay

## 2018-10-21 ENCOUNTER — Ambulatory Visit (INDEPENDENT_AMBULATORY_CARE_PROVIDER_SITE_OTHER): Payer: Self-pay | Admitting: Family Medicine

## 2018-10-21 ENCOUNTER — Encounter: Payer: Self-pay | Admitting: Family Medicine

## 2018-10-21 VITALS — BP 120/62 | HR 79 | Temp 98.3°F | Resp 12 | Ht 66.0 in | Wt 220.2 lb

## 2018-10-21 DIAGNOSIS — R1013 Epigastric pain: Secondary | ICD-10-CM

## 2018-10-21 DIAGNOSIS — R1906 Epigastric swelling, mass or lump: Secondary | ICD-10-CM

## 2018-10-21 MED ORDER — ALPRAZOLAM 0.5 MG PO TABS: ORAL_TABLET | ORAL | 0 refills | Status: DC

## 2018-10-21 NOTE — Progress Notes (Signed)
Patient ID: Felicia Solis, female    DOB: 1965/11/03  Age: 53 y.o. MRN: [id number]    Subjective:  Subjective  HPI Felicia Solis presents for complaints of mass in upper abe that she noticed a few weeks ago and it has become very tender .  No NVD, no fever , chills  Review of Systems  Constitutional: Negative for appetite change, diaphoresis, fatigue and unexpected weight change.  Eyes: Negative for pain, redness and visual disturbance.  Respiratory: Negative for cough, chest tightness, shortness of breath and wheezing.   Cardiovascular: Negative for chest pain, palpitations and leg swelling.  Gastrointestinal: Positive for abdominal pain. Negative for abdominal distention, constipation, diarrhea, nausea and vomiting.  Endocrine: Negative for cold intolerance, heat intolerance, polydipsia, polyphagia and polyuria.  Genitourinary: Negative for difficulty urinating, dysuria and frequency.  Neurological: Negative for dizziness, light-headedness, numbness and headaches.    History Past Medical History:  Diagnosis Date  . Anemia    anemia  when pregnant in 2002  . Anxiety   . Arthritis    "knees and back" (09/28/2016)  . Asthma 5 yrs ago    told she had asthma.. does not remember doctors name.   . Chronic lower back pain   . Depression    takes citalopram  . History of hiatal hernia   . History of kidney stones   . Hyperlipidemia   . Hypothyroidism   . LBBB (left bundle branch block)    rate-related; identified on stress test 03/21/16  . Migraine    "weekly" (09/28/2016)  . 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 Shortness of breath    with exertion  . Thyroid disease   . Type II diabetes mellitus ([place]) dx'd 2016    She has a past surgical history that includes Endometrial ablation; Tubal ligation; laparoscopy; Dilation and curettage of uterus (1999); Thyroidectomy (09/28/2011); Thyroidectomy (09/28/2016); and Thyroidectomy  (Left, 09/28/2016).   Her family history includes Breast cancer in her maternal aunt; Breast cancer (age of onset: 23) in her maternal grandmother; Colon cancer in her paternal grandfather; Colon polyps in her father; Diabetes in her brother, mother, and sister; Gallbladder disease in her maternal grandmother and mother; Heart attack in her brother; Heart disease in her mother and another family member; Hyperlipidemia in her father and mother; Hypertension in her father and mother; Ovarian cancer in her maternal grandmother.She reports that she has never smoked. She has never used smokeless tobacco. She reports that she does not drink alcohol or use drugs.  Current Outpatient Medications on File Prior to Visit  Medication Sig Dispense Refill  . acetaminophen (TYLENOL) 500 MG tablet Take 1,000 mg by mouth every 6 (six) hours as needed for mild pain.    Marland Kitchen ALPRAZolam (XANAX) 0.5 MG tablet Take 1 tablet (0.5 mg total) by mouth 3 (three) times daily as needed for anxiety. (Patient not taking: Reported on 09/02/2018) 10 tablet 0  . aspirin EC 81 MG tablet Take 1 tablet (81 mg total) by mouth daily. 90 tablet 3  . blood glucose meter kit and supplies KIT Dispense based on patient and insurance preference. Use up to four times daily as directed. (FOR ICD-9 250.00, 250.01). 1 each 0  . Cholecalciferol (VITAMIN D3) 2000 units TABS Take 2,000 Units by mouth daily.     . citalopram (CELEXA) 20 MG tablet Take 1 tablet (20 mg total) by mouth daily. 90 tablet 3  .  cyanocobalamin 500 MCG tablet Take 500 mcg by mouth daily.    . cyclobenzaprine (FLEXERIL) 10 MG tablet Take 1 tablet (10 mg total) by mouth 2 (two) times daily as needed for muscle spasms. 20 tablet 0  . diclofenac (VOLTAREN) 75 MG EC tablet   0  . Flaxseed, Linseed, (FLAXSEED OIL PO) Take 1,300 mg by mouth daily.    . fluticasone (FLONASE) 50 MCG/ACT nasal spray Place 2 sprays into both nostrils daily. 16 g 6  . gabapentin (NEURONTIN) 300 MG capsule Take  1 capsule (300 mg total) by mouth 3 (three) times daily. Can start with 1 capsule at nighttime and increase to taking 1 capsule 3 times daily. 90 capsule 0  . Ginkgo Biloba Extract 60 MG CAPS Take 60 mg by mouth daily.    Marland Kitchen glimepiride (AMARYL) 2 MG tablet Take 1 tablet (2 mg total) by mouth daily before breakfast. 90 tablet 1  . glucose blood (ONETOUCH VERIO) test strip Onetouch Verio Check blood sugar twice daily 100 each 12  . hydrochlorothiazide (HYDRODIURIL) 25 MG tablet Take 1 tablet (25 mg total) by mouth daily. (Patient not taking: Reported on 09/02/2018) 30 tablet 2  . HYDROcodone-acetaminophen (NORCO) 5-325 MG tablet Take 1 tablet by mouth every 6 (six) hours as needed for moderate pain. 20 tablet 0  . HYDROcodone-homatropine (HYCODAN) 5-1.5 MG/5ML syrup Take 5 mLs by mouth at bedtime as needed for cough. (Patient not taking: Reported on 09/02/2018) 120 mL 0  . levothyroxine (SYNTHROID) 100 MCG tablet Take 1 tablet (100 mcg total) by mouth daily. 30 tablet 6  . lidocaine (LIDODERM) 5 % Place 1 patch onto the skin daily. Remove & Discard patch within 12 hours or as directed by MD (Patient not taking: Reported on 09/02/2018) 10 patch 0  . losartan (COZAAR) 25 MG tablet Take 1 tablet by mouth once daily 90 tablet 1  . methocarbamol (ROBAXIN) 500 MG tablet Take 1 tablet (500 mg total) by mouth every 6 (six) hours as needed for muscle spasms. 30 tablet 0  . naproxen (NAPROSYN) 375 MG tablet Take 1 tablet (375 mg total) by mouth 2 (two) times daily. (Patient not taking: Reported on 09/02/2018) 20 tablet 0  . Omega-3 Fatty Acids (FISH OIL) 1200 MG CAPS Take 1,200 mg by mouth daily.    . ondansetron (ZOFRAN) 8 MG tablet Take 1 tablet (8 mg total) by mouth every 8 (eight) hours as needed for nausea or vomiting. (Patient not taking: Reported on 09/02/2018) 20 tablet 0  . ONETOUCH DELICA LANCETS FINE MISC Check blood sugar twice daily 100 each 12  . oseltamivir (TAMIFLU) 75 MG capsule Take 1 capsule (75 mg  total) by mouth 2 (two) times daily. (Patient not taking: Reported on 09/02/2018) 10 capsule 0  . pantoprazole (PROTONIX) 40 MG tablet Take 1 tablet (40 mg total) by mouth daily. 90 tablet 3  . predniSONE (DELTASONE) 10 MG tablet 6 tabs po days 1-2, 5 tabs po days 3-4, 4 tabs po days 5-6, 3 tabs po days 7-8, 2 tabs po days 9-10, 1 tab po days 11-12 42 tablet 0  . rosuvastatin (CRESTOR) 20 MG tablet Take 1 tablet (20 mg total) by mouth daily. 90 tablet 1  . sitaGLIPtin-metformin (JANUMET) 50-1000 MG tablet Take 2 tablets by mouth daily with breakfast. 60 tablet 2  . traMADol (ULTRAM) 50 MG tablet Take 1 tablet (50 mg total) by mouth every 8 (eight) hours as needed. 20 tablet 0   No current facility-administered medications  on file prior to visit.      Objective:  Objective  Physical Exam Vitals signs and nursing note reviewed.  Chest:     Chest wall: Mass present.      BP 120/62 (BP Location: Left Arm, Cuff Size: Large)   Pulse 79   Temp 98.3 F (36.8 C) (Oral)   Resp 12   Ht '5\' 6"'$  (1.676 m)   Wt 220 lb 3.2 oz (99.9 kg)   SpO2 100%   BMI 35.54 kg/m  Wt Readings from Last 3 Encounters:  10/21/18 220 lb 3.2 oz (99.9 kg)  09/02/18 232 lb (105.2 kg)  08/28/18 232 lb (105.2 kg)     Lab Results  Component Value Date   WBC 5.8 08/16/2018   HGB 12.1 08/16/2018   HCT 38.7 08/16/2018   PLT 254 08/16/2018   GLUCOSE 202 (H) 08/16/2018   CHOL 110 06/27/2018   TRIG 122.0 06/27/2018   HDL 32.10 (L) 06/27/2018   LDLCALC 54 06/27/2018   ALT 34 08/16/2018   AST 25 08/16/2018   NA 139 08/16/2018   K 3.0 (L) 08/16/2018   CL 105 08/16/2018   CREATININE 0.58 08/16/2018   BUN 12 08/16/2018   CO2 25 08/16/2018   TSH 2.19 03/18/2018   HGBA1C 9.3 (H) 06/27/2018   MICROALBUR 2.5 (H) 04/04/2017    Mm Diag Breast Tomo Uni Left  Result Date: 09/04/2018 CLINICAL DATA:  Screening recall for left breast asymmetry seen on the CC view only. EXAM: DIGITAL DIAGNOSTIC UNILATERAL LEFT MAMMOGRAM  WITH CAD AND TOMO COMPARISON:  Previous exam(s). ACR Breast Density Category b: There are scattered areas of fibroglandular density. FINDINGS: Additional tomograms were performed of the left breast. No persistent suspicious mass or abnormality identified in the left breast. Mammographic images were processed with CAD. IMPRESSION: No findings of malignancy in the left breast. RECOMMENDATION: Screening mammogram in one year.(Code:[id number]-B-01Y) I have discussed the findings and recommendations with the patient. Results were also provided in writing at the conclusion of the visit. If applicable, a reminder letter will be sent to the patient regarding the next appointment. BI-RADS CATEGORY  1: Negative. Electronically Signed   By: Everlean Alstrom M.D.   On: 09/04/2018 14:43     Assessment & Plan:  Plan  I am having Aniesa B. Guastella start on ALPRAZolam. I am also having her maintain her acetaminophen, OneTouch Delica Lancets Fine, vitamin B-12, Ginkgo Biloba Extract, (Flaxseed, Linseed, (FLAXSEED OIL PO)), Fish Oil, Vitamin D3, aspirin EC, levothyroxine, glucose blood, diclofenac, blood glucose meter kit and supplies, fluticasone, ALPRAZolam, ondansetron, traMADol, sitaGLIPtin-metformin, naproxen, lidocaine, oseltamivir, HYDROcodone-homatropine, cyclobenzaprine, citalopram, rosuvastatin, pantoprazole, hydrochlorothiazide, methocarbamol, predniSONE, HYDROcodone-acetaminophen, gabapentin, losartan, and glimepiride.  Meds ordered this encounter  Medications  . ALPRAZolam (XANAX) 0.5 MG tablet    Sig: 1 po 30 min prior to procedure    Dispense:  10 tablet    Refill:  0    Problem List Items Addressed This Visit      Unprioritized   Epigastric pain   Relevant Orders   CBC with Differential/Platelet   Comprehensive metabolic panel    Other Visit Diagnoses    Epigastric mass    -  Primary   Relevant Orders   CT Abdomen Pelvis W Contrast   CBC with Differential/Platelet   Comprehensive metabolic panel       Follow-up: No follow-ups on file.  [name], DO

## 2018-10-21 NOTE — Assessment & Plan Note (Signed)
New per pt and very painful Check CT for further evaluation

## 2018-10-21 NOTE — Patient Instructions (Signed)
Abdominal Pain, Adult  Abdominal pain can be caused by many things. Often, abdominal pain is not serious and it gets better with no treatment or by being treated at home. However, sometimes abdominal pain is serious. Your health care provider will do a medical history and a physical exam to try to determine the cause of your abdominal pain.  Follow these instructions at home:   Take over-the-counter and prescription medicines only as told by your health care provider. Do not take a laxative unless told by your health care provider.   Drink enough fluid to keep your urine clear or pale yellow.   Watch your condition for any changes.   Keep all follow-up visits as told by your health care provider. This is important.  Contact a health care provider if:   Your abdominal pain changes or gets worse.   You are not hungry or you lose weight without trying.   You are constipated or have diarrhea for more than 2-3 days.   You have pain when you urinate or have a bowel movement.   Your abdominal pain wakes you up at night.   Your pain gets worse with meals, after eating, or with certain foods.   You are throwing up and cannot keep anything down.   You have a fever.  Get help right away if:   Your pain does not go away as soon as your health care provider told you to expect.   You cannot stop throwing up.   Your pain is only in areas of the abdomen, such as the right side or the left lower portion of the abdomen.   You have bloody or black stools, or stools that look like tar.   You have severe pain, cramping, or bloating in your abdomen.   You have signs of dehydration, such as:  ? Dark urine, very little urine, or no urine.  ? Cracked lips.  ? Dry mouth.  ? Sunken eyes.  ? Sleepiness.  ? Weakness.  This information is not intended to replace advice given to you by your health care provider. Make sure you discuss any questions you have with your health care provider.  Document Released: 03/21/2005 Document  Revised: 12/30/2015 Document Reviewed: 11/23/2015  Elsevier Interactive Patient Education  2019 [employer]

## 2018-10-22 LAB — COMPREHENSIVE METABOLIC PANEL
ALT: 15 U/L (ref 0–35)
AST: 16 U/L (ref 0–37)
Albumin: 4.4 g/dL (ref 3.5–5.2)
Alkaline Phosphatase: 38 U/L — ABNORMAL LOW (ref 39–117)
BUN: 12 mg/dL (ref 6–23)
CO2: 29 mEq/L (ref 19–32)
Calcium: 9.1 mg/dL (ref 8.4–10.5)
Chloride: 105 mEq/L (ref 96–112)
Creatinine, Ser: 0.61 mg/dL (ref 0.40–1.20)
GFR: 123.66 mL/min (ref >60.00)
Glucose, Bld: 173 mg/dL — ABNORMAL HIGH (ref 70–99)
Potassium: 3.8 mEq/L (ref 3.5–5.1)
Sodium: 141 mEq/L (ref 135–145)
Total Bilirubin: 0.3 mg/dL (ref 0.2–1.2)
Total Protein: 6.9 g/dL (ref 6.0–8.3)

## 2018-10-22 LAB — CBC WITH DIFFERENTIAL/PLATELET
Basophils Absolute: 0.1 10*3/uL (ref 0.0–0.1)
Basophils Relative: 0.6 % (ref 0.0–3.0)
Eosinophils Absolute: 0.1 10*3/uL (ref 0.0–0.7)
Eosinophils Relative: 1 % (ref 0.0–5.0)
HCT: 37.7 % (ref 36.0–46.0)
Hemoglobin: 12.6 g/dL (ref 12.0–15.0)
Lymphocytes Relative: 45.3 % (ref 12.0–46.0)
Lymphs Abs: 3.8 10*3/uL (ref 0.7–4.0)
MCHC: 33.4 g/dL (ref 30.0–36.0)
MCV: 85.3 fl (ref 78.0–100.0)
Monocytes Absolute: 0.6 10*3/uL (ref 0.1–1.0)
Monocytes Relative: 7.5 % (ref 3.0–12.0)
Neutro Abs: 3.9 10*3/uL (ref 1.4–7.7)
Neutrophils Relative %: 45.6 % (ref 43.0–77.0)
Platelets: 269 10*3/uL (ref 150.0–400.0)
RBC: 4.42 Mil/uL (ref 3.87–5.11)
RDW: 13.8 % (ref 11.5–15.5)
WBC: 8.5 10*3/uL (ref 4.0–10.5)

## 2018-10-23 ENCOUNTER — Other Ambulatory Visit: Payer: Self-pay | Admitting: Family Medicine

## 2018-10-23 DIAGNOSIS — IMO0002 Reserved for concepts with insufficient information to code with codable children: Secondary | ICD-10-CM

## 2018-10-23 DIAGNOSIS — E1165 Type 2 diabetes mellitus with hyperglycemia: Principal | ICD-10-CM

## 2018-10-23 DIAGNOSIS — E1151 Type 2 diabetes mellitus with diabetic peripheral angiopathy without gangrene: Secondary | ICD-10-CM

## 2018-10-24 ENCOUNTER — Other Ambulatory Visit: Payer: Self-pay

## 2018-10-24 ENCOUNTER — Ambulatory Visit (HOSPITAL_BASED_OUTPATIENT_CLINIC_OR_DEPARTMENT_OTHER)
Admission: RE | Admit: 2018-10-24 | Discharge: 2018-10-24 | Disposition: A | Discharge status: Home / Self Care | Payer: Self-pay | Source: Ambulatory Visit | Attending: Family Medicine | Admitting: Family Medicine

## 2018-10-24 DIAGNOSIS — R1906 Epigastric swelling, mass or lump: Secondary | ICD-10-CM | POA: Insufficient documentation

## 2018-10-24 MED ORDER — IOHEXOL 300 MG/ML  SOLN
100.0000 mL | Freq: Once | INTRAMUSCULAR | Status: AC | PRN
  Administered 2018-10-24: 16:00:00 100 mL via INTRAVENOUS

## 2018-10-29 ENCOUNTER — Telehealth: Payer: Self-pay

## 2018-10-29 NOTE — Telephone Encounter (Signed)
Copied from [place] [id number]. Topic: General - Other >> Oct 29, 2018 12:56 PM Nils Flack, Marland Kitchen wrote: Reason for [phone] Pt would like to have call back about results from ct scan  Please call back  Thanks

## 2018-11-03 ENCOUNTER — Telehealth: Payer: Self-pay | Admitting: *Deleted

## 2018-11-03 NOTE — Telephone Encounter (Signed)
No answer and vm is full.

## 2018-11-03 NOTE — Telephone Encounter (Signed)
Ua and culture if abn cmp

## 2018-11-03 NOTE — Telephone Encounter (Signed)
Patient stated that she is having some pain on her right lower side in her back.  She has been nauseated.  Her sugars have been ok.  She has been going to the bathroom a little more than usual.  It does not hurt or burn when she urinates.  She has to come back in for a1c do you want any other labs?

## 2018-11-03 NOTE — Telephone Encounter (Signed)
Left message on machine to call back.  See results.

## 2018-11-03 NOTE — Telephone Encounter (Signed)
-----   Message from [name], DO sent at 10/24/2018  4:14 PM EDT ----- Upturned xiphoid process--- part of the sternum --- - this is a normal variant and is in the area of the abnormality but should not cause pain There is also a kidney stone on the r side but is not is the area to cause her pain How is she feeling?

## 2018-11-04 NOTE — Telephone Encounter (Signed)
No answer/ vm is full

## 2018-11-04 NOTE — Telephone Encounter (Signed)
Notes recorded by Doylene Canning, CMA on 11/03/2018 at 9:18 AM EDT Patient will be back in for redraw for a1c  ------  Notes recorded by Doylene Canning, CMA on 10/27/2018 at 2:46 PM EDT No answer/No VM Will need for patient to come back for redraw if possible ------  Notes recorded by Doylene Canning, CMA on 10/27/2018 at 1:50 PM EDT Must add in 24 hours. I will call her to come back. ------  Notes recorded by [name], DO on 10/23/2018 at 5:14 PM EDT Can we add [id number]?

## 2018-12-03 ENCOUNTER — Other Ambulatory Visit: Payer: PRIVATE HEALTH INSURANCE

## 2018-12-15 ENCOUNTER — Other Ambulatory Visit: Payer: Self-pay

## 2018-12-15 ENCOUNTER — Encounter: Payer: Self-pay | Admitting: Family Medicine

## 2018-12-15 ENCOUNTER — Ambulatory Visit (INDEPENDENT_AMBULATORY_CARE_PROVIDER_SITE_OTHER): Payer: BLUE CROSS/BLUE SHIELD | Admitting: Family Medicine

## 2018-12-15 VITALS — BP 106/71 | HR 76 | Temp 98.1°F | Resp 16 | Ht 66.0 in | Wt 221.0 lb

## 2018-12-15 DIAGNOSIS — M79605 Pain in left leg: Secondary | ICD-10-CM

## 2018-12-15 DIAGNOSIS — M5442 Lumbago with sciatica, left side: Secondary | ICD-10-CM

## 2018-12-15 MED ORDER — CYCLOBENZAPRINE HCL 10 MG PO TABS: 10.0000 mg | ORAL_TABLET | Freq: Two times a day (BID) | ORAL | 0 refills | Status: DC | PRN

## 2018-12-15 MED ORDER — MELOXICAM 15 MG PO TABS: 15.0000 mg | ORAL_TABLET | Freq: Every day | ORAL | 0 refills | Status: DC

## 2018-12-15 NOTE — Patient Instructions (Signed)
Acute Back Pain, Adult  Acute back pain is sudden and usually short-lived. It is often caused by an injury to the muscles and tissues in the back. The injury may result from:   A muscle or ligament getting overstretched or torn (strained). Ligaments are tissues that connect bones to each other. Lifting something improperly can cause a back strain.   Wear and tear (degeneration) of the spinal disks. Spinal disks are circular tissue that provides cushioning between the bones of the spine (vertebrae).   Twisting motions, such as while playing sports or doing yard work.   A hit to the back.   Arthritis.  You may have a physical exam, lab tests, and imaging tests to find the cause of your pain. Acute back pain usually goes away with rest and home care.  Follow these instructions at home:  Managing pain, stiffness, and swelling   Take over-the-counter and prescription medicines only as told by your health care provider.   Your health care provider may recommend applying ice during the first 24-48 hours after your pain starts. To do this:  ? Put ice in a plastic bag.  ? Place a towel between your skin and the bag.  ? Leave the ice on for 20 minutes, 2-3 times a day.   If directed, apply heat to the affected area as often as told by your health care provider. Use the heat source that your health care provider recommends, such as a moist heat pack or a heating pad.  ? Place a towel between your skin and the heat source.  ? Leave the heat on for 20-30 minutes.  ? Remove the heat if your skin turns bright red. This is especially important if you are unable to feel pain, heat, or cold. You have a greater risk of getting burned.  Activity     Do not stay in bed. Staying in bed for more than 1-2 days can delay your recovery.   Sit up and stand up straight. Avoid leaning forward when you sit, or hunching over when you stand.  ? If you work at a desk, sit close to it so you do not need to lean over. Keep your chin tucked  in. Keep your neck drawn back, and keep your elbows bent at a right angle. Your arms should look like the letter "L."  ? Sit high and close to the steering wheel when you drive. Add lower back (lumbar) support to your car seat, if needed.   Take short walks on even surfaces as soon as you are able. Try to increase the length of time you walk each day.   Do not sit, drive, or stand in one place for more than 30 minutes at a time. Sitting or standing for long periods of time can put stress on your back.   Do not drive or use heavy machinery while taking prescription pain medicine.   Use proper lifting techniques. When you bend and lift, use positions that put less stress on your back:  ? Bend your knees.  ? Keep the load close to your body.  ? Avoid twisting.   Exercise regularly as told by your health care provider. Exercising helps your back heal faster and helps prevent back injuries by keeping muscles strong and flexible.   Work with a physical therapist to make a safe exercise program, as recommended by your health care provider. Do any exercises as told by your physical therapist.  Lifestyle   Maintain   a healthy weight. Extra weight puts stress on your back and makes it difficult to have good posture.   Avoid activities or situations that make you feel anxious or stressed. Stress and anxiety increase muscle tension and can make back pain worse. Learn ways to manage anxiety and stress, such as through exercise.  General instructions   Sleep on a firm mattress in a comfortable position. Try lying on your side with your knees slightly bent. If you lie on your back, put a pillow under your knees.   Follow your treatment plan as told by your health care provider. This may include:  ? Cognitive or behavioral therapy.  ? Acupuncture or massage therapy.  ? Meditation or yoga.  Contact a health care provider if:   You have pain that is not relieved with rest or medicine.   You have increasing pain going down  into your legs or buttocks.   Your pain does not improve after 2 weeks.   You have pain at night.   You lose weight without trying.   You have a fever or chills.  Get help right away if:   You develop new bowel or bladder control problems.   You have unusual weakness or numbness in your arms or legs.   You develop nausea or vomiting.   You develop abdominal pain.   You feel faint.  Summary   Acute back pain is sudden and usually short-lived.   Use proper lifting techniques. When you bend and lift, use positions that put less stress on your back.   Take over-the-counter and prescription medicines and apply heat or ice as directed by your health care provider.  This information is not intended to replace advice given to you by your health care provider. Make sure you discuss any questions you have with your health care provider.  Document Released: 06/11/2005 Document Revised: 01/16/2018 Document Reviewed: 01/23/2017  Elsevier Interactive Patient Education  2019 [employer]

## 2018-12-15 NOTE — Progress Notes (Signed)
Patient ID: Felicia Solis, female    DOB: [birth year]  Age: 52 y.o. MRN: [id number]    Subjective:  Subjective  HPI Felicia Solis presents for R leg pain and low back pain x several days   No known injury  calfs hurt and behind L knee  Review of Systems  Constitutional: Negative for appetite change, diaphoresis, fatigue and unexpected weight change.  Eyes: Negative for pain, redness and visual disturbance.  Respiratory: Negative for cough, chest tightness, shortness of breath and wheezing.   Cardiovascular: Negative for chest pain, palpitations and leg swelling.  Endocrine: Negative for cold intolerance, heat intolerance, polydipsia, polyphagia and polyuria.  Genitourinary: Negative for difficulty urinating, dysuria and frequency.  Musculoskeletal: Positive for arthralgias, back pain and gait problem.  Neurological: Negative for dizziness, light-headedness, numbness and headaches.    History Past Medical History:  Diagnosis Date   Anemia    anemia  when pregnant in 2002   Anxiety    Arthritis    "knees and back" (09/28/2016)   Asthma 5 yrs ago    told she had asthma.. does not remember doctors name.    Chronic lower back pain    Depression    takes citalopram   History of hiatal hernia    History of kidney stones    Hyperlipidemia    Hypothyroidism    LBBB (left bundle branch block)    rate-related; identified on stress test 03/21/16   Migraine    "weekly" (09/28/2016)   Positive TB test 2008   pt states she took 9 months of medicine for positive tb skin test   Shortness of breath    with exertion   Thyroid disease    Type II diabetes mellitus ([place]) dx'd 2016    She has a past surgical history that includes Endometrial ablation; Tubal ligation; laparoscopy; Dilation and curettage of uterus (1999); Thyroidectomy (09/28/2011); Thyroidectomy (09/28/2016); and Thyroidectomy (Left, 09/28/2016).   Her family history includes Breast cancer in her maternal aunt; Breast  cancer (age of onset: 51) in her maternal grandmother; Colon cancer in her paternal grandfather; Colon polyps in her father; Diabetes in her brother, mother, and sister; Gallbladder disease in her maternal grandmother and mother; Heart attack in her brother; Heart disease in her mother and another family member; Hyperlipidemia in her father and mother; Hypertension in her father and mother; Ovarian cancer in her maternal grandmother.She reports that she has never smoked. She has never used smokeless tobacco. She reports that she does not drink alcohol or use drugs.  Current Outpatient Medications on File Prior to Visit  Medication Sig Dispense Refill   acetaminophen (TYLENOL) 500 MG tablet Take 1,000 mg by mouth every 6 (six) hours as needed for mild pain.     ALPRAZolam (XANAX) 0.5 MG tablet 1 po 30 min prior to procedure 10 tablet 0   aspirin EC 81 MG tablet Take 1 tablet (81 mg total) by mouth daily. 90 tablet 3   blood glucose meter kit and supplies KIT Dispense based on patient and insurance preference. Use up to four times daily as directed. (FOR ICD-9 250.00, 250.01). 1 each 0   Cholecalciferol (VITAMIN D3) 2000 units TABS Take 2,000 Units by mouth daily.      citalopram (CELEXA) 20 MG tablet Take 1 tablet (20 mg total) by mouth daily. 90 tablet 3   cyanocobalamin 500 MCG tablet Take 500 mcg by mouth daily.     diclofenac (VOLTAREN) 75 MG EC tablet   0  Flaxseed, Linseed, (FLAXSEED OIL PO) Take 1,300 mg by mouth daily.     fluticasone (FLONASE) 50 MCG/ACT nasal spray Place 2 sprays into both nostrils daily. 16 g 6   gabapentin (NEURONTIN) 300 MG capsule Take 1 capsule (300 mg total) by mouth 3 (three) times daily. Can start with 1 capsule at nighttime and increase to taking 1 capsule 3 times daily. 90 capsule 0   Ginkgo Biloba Extract 60 MG CAPS Take 60 mg by mouth daily.     glimepiride (AMARYL) 2 MG tablet Take 1 tablet (2 mg total) by mouth daily before breakfast. 90 tablet 1     glucose blood (ONETOUCH VERIO) test strip Onetouch Verio Check blood sugar twice daily 100 each 12   hydrochlorothiazide (HYDRODIURIL) 25 MG tablet Take 1 tablet (25 mg total) by mouth daily. 30 tablet 2   HYDROcodone-acetaminophen (NORCO) 5-325 MG tablet Take 1 tablet by mouth every 6 (six) hours as needed for moderate pain. 20 tablet 0   HYDROcodone-homatropine (HYCODAN) 5-1.5 MG/5ML syrup Take 5 mLs by mouth at bedtime as needed for cough. 120 mL 0   levothyroxine (SYNTHROID) 100 MCG tablet Take 1 tablet (100 mcg total) by mouth daily. 30 tablet 6   lidocaine (LIDODERM) 5 % Place 1 patch onto the skin daily. Remove & Discard patch within 12 hours or as directed by MD 10 patch 0   losartan (COZAAR) 25 MG tablet Take 1 tablet by mouth once daily 90 tablet 1   naproxen (NAPROSYN) 375 MG tablet Take 1 tablet (375 mg total) by mouth 2 (two) times daily. 20 tablet 0   Omega-3 Fatty Acids (FISH OIL) 1200 MG CAPS Take 1,200 mg by mouth daily.     ondansetron (ZOFRAN) 8 MG tablet Take 1 tablet (8 mg total) by mouth every 8 (eight) hours as needed for nausea or vomiting. 20 tablet 0   ONETOUCH DELICA LANCETS FINE MISC Check blood sugar twice daily 100 each 12   oseltamivir (TAMIFLU) 75 MG capsule Take 1 capsule (75 mg total) by mouth 2 (two) times daily. 10 capsule 0   pantoprazole (PROTONIX) 40 MG tablet Take 1 tablet (40 mg total) by mouth daily. 90 tablet 3   predniSONE (DELTASONE) 10 MG tablet 6 tabs po days 1-2, 5 tabs po days 3-4, 4 tabs po days 5-6, 3 tabs po days 7-8, 2 tabs po days 9-10, 1 tab po days 11-12 42 tablet 0   rosuvastatin (CRESTOR) 20 MG tablet Take 1 tablet (20 mg total) by mouth daily. 90 tablet 1   sitaGLIPtin-metformin (JANUMET) 50-1000 MG tablet Take 2 tablets by mouth daily with breakfast. 60 tablet 2   traMADol (ULTRAM) 50 MG tablet Take 1 tablet (50 mg total) by mouth every 8 (eight) hours as needed. 20 tablet 0   ALPRAZolam (XANAX) 0.5 MG tablet Take 1  tablet (0.5 mg total) by mouth 3 (three) times daily as needed for anxiety. (Patient not taking: Reported on 09/02/2018) 10 tablet 0   No current facility-administered medications on file prior to visit.      Objective:  Objective  Physical Exam Vitals signs and nursing note reviewed.  Constitutional:      Appearance: She is well-developed.  HENT:     Head: Normocephalic and atraumatic.  Eyes:     Conjunctiva/sclera: Conjunctivae normal.  Neck:     Musculoskeletal: Normal range of motion and neck supple.     Thyroid: No thyromegaly.     Vascular: No carotid bruit or  JVD.  Cardiovascular:     Rate and Rhythm: Normal rate and regular rhythm.     Heart sounds: Normal heart sounds. No murmur.  Pulmonary:     Effort: Pulmonary effort is normal. No respiratory distress.     Breath sounds: Normal breath sounds. No wheezing or rales.  Chest:     Chest wall: No tenderness.  Musculoskeletal:        General: Tenderness present.     Left knee: Tenderness found.       Legs:  Neurological:     Mental Status: She is alert and oriented to person, place, and time.    BP 106/71 (BP Location: Right Arm, Patient Position: Sitting, Cuff Size: Normal)    Pulse 76    Temp 98.1 F (36.7 C) (Oral)    Resp 16    Ht '5\' 6"'$  (1.676 m)    Wt 221 lb (100.2 kg)    SpO2 99%    BMI 35.67 kg/m  Wt Readings from Last 3 Encounters:  12/15/18 221 lb (100.2 kg)  10/21/18 220 lb 3.2 oz (99.9 kg)  09/02/18 232 lb (105.2 kg)     Lab Results  Component Value Date   WBC 8.5 10/21/2018   HGB 12.6 10/21/2018   HCT 37.7 10/21/2018   PLT 269.0 10/21/2018   GLUCOSE 173 (H) 10/21/2018   CHOL 110 06/27/2018   TRIG 122.0 06/27/2018   HDL 32.10 (L) 06/27/2018   LDLCALC 54 06/27/2018   ALT 15 10/21/2018   AST 16 10/21/2018   NA 141 10/21/2018   K 3.8 10/21/2018   CL 105 10/21/2018   CREATININE 0.61 10/21/2018   BUN 12 10/21/2018   CO2 29 10/21/2018   TSH 2.19 03/18/2018   HGBA1C 9.3 (H) 06/27/2018    MICROALBUR 2.5 (H) 04/04/2017    Ct Abdomen Pelvis W Contrast  Result Date: 10/24/2018 CLINICAL DATA:  Abdominal pain, palpable epigastric mass EXAM: CT ABDOMEN AND PELVIS WITH CONTRAST TECHNIQUE: Multidetector CT imaging of the abdomen and pelvis was performed using the standard protocol following bolus administration of intravenous contrast. CONTRAST:  174m OMNIPAQUE IOHEXOL 300 MG/ML SOLN, additional oral enteric contrast COMPARISON:  08/05/2017 FINDINGS: Lower chest: No acute abnormality. Hepatobiliary: No focal liver abnormality is seen. No gallstones, gallbladder wall thickening, or biliary dilatation. Pancreas: Unremarkable. No pancreatic ductal dilatation or surrounding inflammatory changes. Spleen: Normal in size without focal abnormality. Adrenals/Urinary Tract: Adrenal glands are unremarkable. Nonobstructive right-sided renal calculi including a 1.3 cm calculus in the right renal pelvis (series 5, image 79). Bladder is unremarkable. Stomach/Bowel: Stomach is within normal limits. Appendix appears normal. No evidence of bowel wall thickening, distention, or inflammatory changes. Occasional sigmoid diverticula. Moderate burden of stool in the colon. Vascular/Lymphatic: No significant vascular findings are present. No enlarged abdominal or pelvic lymph nodes. Reproductive: Fibroid uterus. Other: No abdominal wall hernia or abnormality. No abdominopelvic ascites. Musculoskeletal: No acute or significant osseous findings. There is an upturned xiphoid process (series 6, image 64). IMPRESSION: 1. No acute CT findings of the abdomen or pelvis to explain epigastric abdominal pain. 2. There is an upturned xiphoid process (series 6, image 64), a normal anatomic variant which may explain palpable abnormality. 3. Nonobstructive right-sided renal calculi including a 1.3 cm calculus in the right renal pelvis (series 5, image 79). This calculus has migrated to the renal pelvis from the inferior pole when compared to  prior examination dated 2019. 4.  Other chronic and incidental findings as detailed above. Electronically Signed  By: Eddie Candle M.D.   On: 10/24/2018 16:01     Assessment & Plan:  Plan  I have discontinued Apryll B. Olander's methocarbamol. I am also having her start on meloxicam. Additionally, I am having her maintain her acetaminophen, OneTouch Delica Lancets Fine, vitamin B-12, Ginkgo Biloba Extract, (Flaxseed, Linseed, (FLAXSEED OIL PO)), Fish Oil, Vitamin D3, aspirin EC, levothyroxine, glucose blood, diclofenac, blood glucose meter kit and supplies, fluticasone, ALPRAZolam, ondansetron, traMADol, sitaGLIPtin-metformin, naproxen, lidocaine, oseltamivir, HYDROcodone-homatropine, citalopram, rosuvastatin, pantoprazole, hydrochlorothiazide, predniSONE, HYDROcodone-acetaminophen, gabapentin, losartan, glimepiride, ALPRAZolam, and cyclobenzaprine.  Meds ordered this encounter  Medications   cyclobenzaprine (FLEXERIL) 10 MG tablet    Sig: Take 1 tablet (10 mg total) by mouth 2 (two) times daily as needed for muscle spasms.    Dispense:  20 tablet    Refill:  0   meloxicam (MOBIC) 15 MG tablet    Sig: Take 1 tablet (15 mg total) by mouth daily.    Dispense:  30 tablet    Refill:  0    Problem List Items Addressed This Visit    None    Visit Diagnoses    Left leg pain    -  Primary   Relevant Orders   US Venous Img Lower Unilateral Left   Acute left-sided low back pain with left-sided sciatica       Relevant Medications   cyclobenzaprine (FLEXERIL) 10 MG tablet   meloxicam (MOBIC) 15 MG tablet      Follow-up: No follow-ups on file.  [name], DO

## 2018-12-16 ENCOUNTER — Ambulatory Visit (HOSPITAL_BASED_OUTPATIENT_CLINIC_OR_DEPARTMENT_OTHER)
Admission: RE | Admit: 2018-12-16 | Discharge: 2018-12-16 | Disposition: A | Discharge status: Home / Self Care | Payer: BLUE CROSS/BLUE SHIELD | Source: Ambulatory Visit | Attending: Family Medicine | Admitting: Family Medicine

## 2018-12-16 DIAGNOSIS — M79605 Pain in left leg: Secondary | ICD-10-CM

## 2018-12-16 DIAGNOSIS — M79662 Pain in left lower leg: Secondary | ICD-10-CM | POA: Diagnosis not present

## 2018-12-19 ENCOUNTER — Other Ambulatory Visit: Payer: Self-pay | Admitting: Family Medicine

## 2018-12-19 ENCOUNTER — Telehealth: Payer: Self-pay | Admitting: Family Medicine

## 2018-12-19 DIAGNOSIS — M545 Low back pain, unspecified: Secondary | ICD-10-CM

## 2018-12-19 DIAGNOSIS — M5442 Lumbago with sciatica, left side: Secondary | ICD-10-CM

## 2018-12-19 MED ORDER — CYCLOBENZAPRINE HCL 10 MG PO TABS: 10.0000 mg | ORAL_TABLET | Freq: Two times a day (BID) | ORAL | 0 refills | Status: DC | PRN

## 2018-12-19 NOTE — Telephone Encounter (Signed)
I will refer to ortho

## 2018-12-19 NOTE — Telephone Encounter (Signed)
Ultrasound was negative for dvt, Pt wanting to know what is next in treatment?

## 2018-12-19 NOTE — Telephone Encounter (Signed)
Medication: cyclobenzaprine (FLEXERIL) 10 MG tablet [[id number]] Pharmacy did not receive the prescription. Can this be resent please?  Has the patient contacted their pharmacy? Yes  (Agent: If no, request that the patient contact the pharmacy for the refill.) (Agent: If yes, when and what did the pharmacy advise?)  Preferred Pharmacy (with phone number or street name):   [facility], [place] [phone] (Phone) [phone] (Fax)    Agent: Please be advised that RX refills may take up to 3 business days. We ask that you follow-up with your pharmacy.

## 2018-12-19 NOTE — Telephone Encounter (Signed)
Patient is calling to get ultrasound sound results for her leg and to determine what the next step is for treatment. Please advise. Thank you. Cb- [phone]

## 2018-12-19 NOTE — Telephone Encounter (Signed)
LMOM informing Pt to return call regarding results. Okay for PEC to discuss.

## 2018-12-19 NOTE — Telephone Encounter (Signed)
Rx resent.

## 2018-12-23 ENCOUNTER — Ambulatory Visit
Admission: RE | Admit: 2018-12-23 | Discharge: 2018-12-23 | Disposition: A | Discharge status: Home / Self Care | Payer: BLUE CROSS/BLUE SHIELD | Source: Ambulatory Visit | Attending: Family Medicine | Admitting: Family Medicine

## 2018-12-23 ENCOUNTER — Other Ambulatory Visit: Payer: Self-pay

## 2018-12-23 ENCOUNTER — Telehealth: Payer: Self-pay | Admitting: Family Medicine

## 2018-12-23 DIAGNOSIS — M4802 Spinal stenosis, cervical region: Secondary | ICD-10-CM | POA: Diagnosis not present

## 2018-12-23 DIAGNOSIS — M5412 Radiculopathy, cervical region: Secondary | ICD-10-CM

## 2018-12-23 NOTE — Telephone Encounter (Signed)
Received a voicemail that the mri cervical spine w/o contrast has been approved  Auth # [id number]

## 2018-12-23 NOTE — Telephone Encounter (Signed)
Spoke with patient regarding her neck pain radiating into her left arm.  She reports she continues to have pain with radiation and tingling into the right arm despite conservative measures.  Given COVID and cancellation of non-emergent imaging at imaging facilities she couldn't go ahead with MRI back when we had initially ordered this.

## 2019-01-02 ENCOUNTER — Ambulatory Visit: Payer: Self-pay

## 2019-01-02 ENCOUNTER — Encounter: Payer: Self-pay | Admitting: Orthopaedic Surgery

## 2019-01-02 ENCOUNTER — Other Ambulatory Visit: Payer: Self-pay

## 2019-01-02 ENCOUNTER — Ambulatory Visit (INDEPENDENT_AMBULATORY_CARE_PROVIDER_SITE_OTHER): Payer: BLUE CROSS/BLUE SHIELD | Admitting: Orthopaedic Surgery

## 2019-01-02 VITALS — Ht 66.0 in | Wt 224.0 lb

## 2019-01-02 DIAGNOSIS — G8929 Other chronic pain: Secondary | ICD-10-CM

## 2019-01-02 DIAGNOSIS — M545 Low back pain, unspecified: Secondary | ICD-10-CM

## 2019-01-02 DIAGNOSIS — M5020 Other cervical disc displacement, unspecified cervical region: Secondary | ICD-10-CM

## 2019-01-02 DIAGNOSIS — M25562 Pain in left knee: Secondary | ICD-10-CM | POA: Diagnosis not present

## 2019-01-02 NOTE — Progress Notes (Signed)
Office Visit Note   Patient: Felicia Solis           Date of Birth: 1965/09/15           MRN: [id number] Visit Date: 01/02/2019              Requested by: [address] [place], [place] [address] [place],  [place] [address] PCP: Carollee Herter, Alferd Apa, DO   Assessment & Plan: Visit Diagnoses:  1. Chronic pain of left knee   2. Chronic bilateral low back pain, unspecified whether sciatica present   3. HNP (herniated nucleus pulposus), cervical     Plan: We will set her up to include some physical therapy for her lower back as well as the current therapy that is been ordered for her neck and shoulder.  We reviewed the cervical MRI scan.  Will defer lumbar MRI imaging at this point.  Recheck 3 weeks to check her progress.  Pathophysiology discussed concerning her cervical disc herniation.  Follow-Up Instructions: Return in about 3 weeks (around 01/23/2019).   Orders:  Orders Placed This Encounter  Procedures  . XR Lumbar Spine 2-3 Views  . XR KNEE 3 VIEW LEFT  . Ambulatory referral to Physical Therapy   No orders of the defined types were placed in this encounter.     Procedures: No procedures performed   Clinical Data: No additional findings.   Subjective: Chief Complaint  Patient presents with  . Lower Back - Pain  . Left Leg - Pain    HPI 53 year old female referred by Dr. Carollee Herter for evaluation of low back pain.  Patient states she has had left lower extremity pain and swelling if she does a lot of walking.  She has pain anteriorly over the tibia if she flexes her hip and knee.  She also has had pain behind her knee.  She has had Doppler test negative for DVT.  Pain radiates across lumbosacral junction not down the right leg with down her left.  No chills or fever.  No bowel bladder symptoms.  Sometimes pain radiates all the way to her foot.  Patient states she is also had significant problems with neck pain with pain that radiates from her neck into  her right arm and hand down to her long finger.  She is noticed some weakness with pushing no weakness with grip or pulling.  Pain in her neck is been constant bothers her work worse at night.  MRI scan is been obtained of the cervical spine 12/24/2018 which shows moderate right side spinal stenosis due to moderate size right C6-7 paracentral disc extrusion with right C7 nerve root impingement.  Borderline mild narrowing at C4-5, minimal bulge at C5-6 without compression.  Review of Systems positive for depression, anxiety, history of bronchitis allergies dysphasia chest pain epigastric pain thyroid nodule.  Cervical disc extrusion right C6-7.,  Low back pain, otherwise negative is obtains HPI.   Objective: Vital Signs: Ht 5\' 6"  (1.676 m)   Wt 224 lb (101.6 kg)   BMI 36.15 kg/m   Physical Exam Constitutional:      Appearance: She is well-developed.  HENT:     Head: Normocephalic.     Right Ear: External ear normal.     Left Ear: External ear normal.  Eyes:     Pupils: Pupils are equal, round, and reactive to light.  Neck:     Thyroid: No thyromegaly.     Trachea: No  tracheal deviation.  Cardiovascular:     Rate and Rhythm: Normal rate.  Pulmonary:     Effort: Pulmonary effort is normal.  Abdominal:     Palpations: Abdomen is soft.  Skin:    General: Skin is warm and dry.  Neurological:     Mental Status: She is alert and oriented to person, place, and time.  Psychiatric:        Behavior: Behavior normal.     Ortho Exam patient has brachial plexus tenderness on the right negative on the left positive Spurling on the right.  No triceps weakness upper extremity reflexes are 2+ and symmetrical right and left.  She has trace triceps weakness on the right normal on the left.  No interossei weakness flexors are normal.  Normal wrist extension right and left.  Patient has some sciatic notch tenderness both right and left.  Knee and ankle jerk are intact no clonus.  Anterior tib  gastrocsoleus heel and toe walking is normal no myelopathic gait changes.  Specialty Comments:  No specialty comments available.  Imaging: No results found.   PMFS History: Patient Active Problem List   Diagnosis Date Noted  . HNP (herniated nucleus pulposus), cervical 01/04/2019  . Epigastric mass 10/21/2018  . Myalgia 03/18/2018  . Hyperlipidemia associated with type 2 diabetes mellitus ([place]) 03/18/2018  . Nausea and vomiting 12/02/2017  . Hematuria 12/02/2017  . Pansinusitis 11/19/2017  . 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 Preventative health care 09/27/2017  . Hyperlipidemia LDL goal <100 09/18/2017  . Vitamin D deficiency 09/18/2017  . Hypothyroidism 09/18/2017  . Other intervertebral disc degeneration, lumbar region 04/07/2017  . Left hip pain 04/07/2017  . Hyperlipidemia LDL goal <70 04/07/2017  . Essential hypertension 04/07/2017  . Acute vaginitis 04/07/2017  . S/P complete thyroidectomy 09/28/2016  . Memory loss 08/23/2016  . Thyroid nodule 08/02/2016  . Plantar fasciitis of right foot 12/01/2015  . Knee pain, right 12/01/2015  . Headache, migraine 09/06/2015  . Bilateral knee pain 08/22/2015  . Thoracic myofascial strain 08/22/2015  . Dysphagia 04/10/2015  . Chest pain 04/10/2015  . Epigastric pain 04/10/2015  . Functional diarrhea 04/10/2015  . NSAID long-term use 04/10/2015  . Midepigastric pain 03/22/2015  . Cough 01/07/2015  . Allergic rhinitis 12/13/2014  . Acute bronchitis 12/13/2014  . Blood in stool 10/01/2014  . DM (diabetes mellitus) type II uncontrolled, periph vascular disorder ([place]) 08/13/2014  . Abnormal ECG 05/14/2012  . Breast pain 10/23/2011  . ASCUS on Pap smear 10/08/2011  . H/O thyroid nodule 08/27/2011  . Depression with anxiety 08/27/2011  . Heavy periods 08/27/2011   Past Medical History:  Diagnosis Date  . Anemia    anemia  when pregnant in 2002  . Anxiety   . Arthritis    "knees and back" (09/28/2016)  . Asthma  5 yrs ago    told she had asthma.. does not remember doctors name.   . Chronic lower back pain   . Depression    takes citalopram  . History of hiatal hernia   . History of kidney stones   . Hyperlipidemia   . Hypothyroidism   . LBBB (left bundle branch block)    rate-related; identified on stress test 03/21/16  . Migraine    "weekly" (09/28/2016)  . 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 Shortness of breath    with exertion  . Thyroid disease   .  Type II diabetes mellitus ([place]) dx'd 2016    Family History  Problem Relation Age of Onset  . Diabetes Mother   . Hypertension Mother   . Hyperlipidemia Mother   . Gallbladder disease Mother   . Heart disease Mother   . Diabetes Sister   . Hypertension Father   . Hyperlipidemia Father   . Colon polyps Father   . Ovarian cancer Maternal Grandmother   . Gallbladder disease Maternal Grandmother   . Breast cancer Maternal Grandmother 42  . Colon cancer Paternal Grandfather   . Breast cancer Maternal Aunt        late 61s  . Heart disease Other        early cad  . Diabetes Brother   . Heart attack Brother   . Anesthesia problems Neg Hx   . Hypotension Neg Hx   . Malignant hyperthermia Neg Hx   . Pseudochol deficiency Neg Hx   . Esophageal cancer Neg Hx     Past Surgical History:  Procedure Laterality Date  . DILATION AND CURETTAGE OF UTERUS  1999  . ENDOMETRIAL ABLATION    . LAPAROSCOPY     years ago ..pt does not know where surgery was done ... states" my stomach would blow up then go down"  . THYROIDECTOMY  09/28/2011   Procedure: THYROIDECTOMY;  Surgeon: Izora Gala, MD;  Location: [facility];  Service: ENT;  Laterality: Right;  RIGHT THYROIDECTOMY WITH FROZEN SECTION  . THYROIDECTOMY  09/28/2016   completion of thyroidectomy/notes 09/28/2016  . THYROIDECTOMY Left 09/28/2016   Procedure: COMPLETION OF THYROIDECTOMY;  Surgeon: Izora Gala, MD;  Location: [facility];  Service: ENT;   Laterality: Left;  . TUBAL LIGATION     Social History   Occupational History  . Occupation: CNA  Tobacco Use  . Smoking status: Never Smoker  . Smokeless tobacco: Never Used  Substance and Sexual Activity  . Alcohol use: No    Alcohol/week: 0.0 standard drinks  . Drug use: No  . Sexual activity: Not on file

## 2019-01-04 DIAGNOSIS — M5020 Other cervical disc displacement, unspecified cervical region: Secondary | ICD-10-CM | POA: Insufficient documentation

## 2019-01-16 ENCOUNTER — Other Ambulatory Visit: Payer: Self-pay | Admitting: *Deleted

## 2019-01-16 DIAGNOSIS — M5412 Radiculopathy, cervical region: Secondary | ICD-10-CM

## 2019-01-26 ENCOUNTER — Other Ambulatory Visit: Payer: Self-pay

## 2019-01-26 ENCOUNTER — Encounter: Payer: Self-pay | Admitting: Family Medicine

## 2019-01-26 ENCOUNTER — Ambulatory Visit (INDEPENDENT_AMBULATORY_CARE_PROVIDER_SITE_OTHER): Payer: BLUE CROSS/BLUE SHIELD | Admitting: Family Medicine

## 2019-01-26 DIAGNOSIS — R519 Headache, unspecified: Secondary | ICD-10-CM

## 2019-01-26 DIAGNOSIS — R51 Headache: Secondary | ICD-10-CM

## 2019-01-26 MED ORDER — CEFUROXIME AXETIL 500 MG PO TABS: 500.0000 mg | ORAL_TABLET | Freq: Two times a day (BID) | ORAL | 0 refills | Status: DC

## 2019-01-26 MED ORDER — FLUTICASONE PROPIONATE 50 MCG/ACT NA SUSP: 2.0000 | Freq: Every day | NASAL | 6 refills | Status: DC

## 2019-01-26 MED ORDER — LEVOCETIRIZINE DIHYDROCHLORIDE 5 MG PO TABS: 5.0000 mg | ORAL_TABLET | Freq: Every evening | ORAL | 5 refills | Status: DC

## 2019-01-26 NOTE — Progress Notes (Signed)
Virtual Visit via Video Note  I connected with Felicia Solis on 01/26/19 at  4:00 PM EDT by a video enabled telemedicine application and verified that I am speaking with the correct person using two identifiers.  Location: Patient: in car  Provider: office    I discussed the limitations of evaluation and management by telemedicine and the availability of in person appointments. The patient expressed understanding and agreed to proceed.  History of Present Illness: Pt is in her car c/o sinus headache.  She works in a nsg facility.  No covid cases that she knows of.   Some nasal congestion.  No fever, cough, ST.  + body aches    Observations/Objective: .no vitals obtained  Pt is in NAD   Assessment and Plan: 1. Sinus headache Flonase, antihistamine and abx per orders - Novel Coronavirus, NAA ([facility]); Future - cefUROXime (CEFTIN) 500 MG tablet; Take 1 tablet (500 mg total) by mouth 2 (two) times daily with a meal.  Dispense: 20 tablet; Refill: 0 - fluticasone (FLONASE) 50 MCG/ACT nasal spray; Place 2 sprays into both nostrils daily.  Dispense: 16 g; Refill: 6 - levocetirizine (XYZAL) 5 MG tablet; Take 1 tablet (5 mg total) by mouth every evening.  Dispense: 30 tablet; Refill: 5   Follow Up Instructions:    I discussed the assessment and treatment plan with the patient. The patient was provided an opportunity to ask questions and all were answered. The patient agreed with the plan and demonstrated an understanding of the instructions.   The patient was advised to call back or seek an in-person evaluation if the symptoms worsen or if the condition fails to improve as anticipated.  I provided 15 minutes of non-face-to-face time during this encounter.   [name], DO

## 2019-01-27 ENCOUNTER — Ambulatory Visit: Payer: BLUE CROSS/BLUE SHIELD | Admitting: Orthopaedic Surgery

## 2019-01-27 ENCOUNTER — Other Ambulatory Visit: Payer: Self-pay

## 2019-01-27 DIAGNOSIS — R6889 Other general symptoms and signs: Secondary | ICD-10-CM | POA: Diagnosis not present

## 2019-01-27 DIAGNOSIS — Z20822 Contact with and (suspected) exposure to covid-19: Secondary | ICD-10-CM

## 2019-01-28 LAB — NOVEL CORONAVIRUS, NAA: SARS-CoV-2, NAA: NOT DETECTED

## 2019-01-30 DIAGNOSIS — U071 COVID-19: Secondary | ICD-10-CM | POA: Diagnosis not present

## 2019-02-05 DIAGNOSIS — M5441 Lumbago with sciatica, right side: Secondary | ICD-10-CM | POA: Diagnosis not present

## 2019-02-05 DIAGNOSIS — M545 Low back pain: Secondary | ICD-10-CM | POA: Diagnosis not present

## 2019-02-05 DIAGNOSIS — M542 Cervicalgia: Secondary | ICD-10-CM | POA: Diagnosis not present

## 2019-02-05 DIAGNOSIS — M5442 Lumbago with sciatica, left side: Secondary | ICD-10-CM | POA: Diagnosis not present

## 2019-02-12 DIAGNOSIS — M5441 Lumbago with sciatica, right side: Secondary | ICD-10-CM | POA: Diagnosis not present

## 2019-02-12 DIAGNOSIS — M545 Low back pain: Secondary | ICD-10-CM | POA: Diagnosis not present

## 2019-02-12 DIAGNOSIS — M5442 Lumbago with sciatica, left side: Secondary | ICD-10-CM | POA: Diagnosis not present

## 2019-02-12 DIAGNOSIS — M542 Cervicalgia: Secondary | ICD-10-CM | POA: Diagnosis not present

## 2019-02-17 DIAGNOSIS — M5442 Lumbago with sciatica, left side: Secondary | ICD-10-CM | POA: Diagnosis not present

## 2019-02-17 DIAGNOSIS — M5441 Lumbago with sciatica, right side: Secondary | ICD-10-CM | POA: Diagnosis not present

## 2019-02-17 DIAGNOSIS — M542 Cervicalgia: Secondary | ICD-10-CM | POA: Diagnosis not present

## 2019-02-17 DIAGNOSIS — M545 Low back pain: Secondary | ICD-10-CM | POA: Diagnosis not present

## 2019-02-18 ENCOUNTER — Encounter: Payer: Self-pay | Admitting: Family Medicine

## 2019-02-19 DIAGNOSIS — M542 Cervicalgia: Secondary | ICD-10-CM | POA: Diagnosis not present

## 2019-02-19 DIAGNOSIS — M545 Low back pain: Secondary | ICD-10-CM | POA: Diagnosis not present

## 2019-02-19 DIAGNOSIS — M5441 Lumbago with sciatica, right side: Secondary | ICD-10-CM | POA: Diagnosis not present

## 2019-02-19 DIAGNOSIS — M5442 Lumbago with sciatica, left side: Secondary | ICD-10-CM | POA: Diagnosis not present

## 2019-02-20 DIAGNOSIS — U071 COVID-19: Secondary | ICD-10-CM | POA: Diagnosis not present

## 2019-02-23 ENCOUNTER — Encounter: Payer: Self-pay | Admitting: Family Medicine

## 2019-02-23 ENCOUNTER — Other Ambulatory Visit: Payer: Self-pay

## 2019-02-23 ENCOUNTER — Ambulatory Visit (INDEPENDENT_AMBULATORY_CARE_PROVIDER_SITE_OTHER): Payer: BLUE CROSS/BLUE SHIELD | Admitting: Family Medicine

## 2019-02-23 VITALS — BP 107/62 | HR 79 | Temp 97.5°F | Resp 12 | Ht 66.0 in | Wt 220.0 lb

## 2019-02-23 DIAGNOSIS — Z0000 Encounter for general adult medical examination without abnormal findings: Secondary | ICD-10-CM

## 2019-02-23 DIAGNOSIS — I10 Essential (primary) hypertension: Secondary | ICD-10-CM

## 2019-02-23 DIAGNOSIS — E1169 Type 2 diabetes mellitus with other specified complication: Secondary | ICD-10-CM

## 2019-02-23 DIAGNOSIS — Z0001 Encounter for general adult medical examination with abnormal findings: Secondary | ICD-10-CM

## 2019-02-23 DIAGNOSIS — E1165 Type 2 diabetes mellitus with hyperglycemia: Secondary | ICD-10-CM

## 2019-02-23 DIAGNOSIS — E785 Hyperlipidemia, unspecified: Secondary | ICD-10-CM | POA: Diagnosis not present

## 2019-02-23 DIAGNOSIS — R102 Pelvic and perineal pain: Secondary | ICD-10-CM

## 2019-02-23 NOTE — Progress Notes (Addendum)
Subjective:     Felicia Solis is a 53 y.o. female and is here for a comprehensive physical exam. The patient reports pelvic pain x several days.  No vaginal d/c.  No urinary symptoms   Social History   Socioeconomic History  . Marital status: Married    Spouse name: Not on file  . Number of children: 3  . Years of education: Not on file  . Highest education level: Not on file  Occupational History  . Occupation: CNA  Social Needs  . Financial resource strain: Not on file  . Food insecurity    Worry: Not on file    Inability: Not on file  . Transportation needs    Medical: Not on file    Non-medical: Not on file  Tobacco Use  . Smoking status: Never Smoker  . Smokeless tobacco: Never Used  Substance and Sexual Activity  . Alcohol use: No    Alcohol/week: 0.0 standard drinks  . Drug use: No  . Sexual activity: Not on file  Lifestyle  . Physical activity    Days per week: Not on file    Minutes per session: Not on file  . Stress: Not on file  Relationships  . Social Herbalist on phone: Not on file    Gets together: Not on file    Attends religious service: Not on file    Active member of club or organization: Not on file    Attends meetings of clubs or organizations: Not on file    Relationship status: Not on file  . Intimate partner violence    Fear of current or ex partner: Not on file    Emotionally abused: Not on file    Physically abused: Not on file    Forced sexual activity: Not on file  Other Topics Concern  . Not on file  Social History Narrative   No exercise--  Walking and lifting pts at work   Health Maintenance  Topic Date Due  . HEMOGLOBIN A1C  12/26/2018  . OPHTHALMOLOGY EXAM  01/02/2019  . INFLUENZA VACCINE  09/23/2019 (Originally 01/24/2019)  . PNEUMOCOCCAL POLYSACCHARIDE VACCINE AGE 63-64 HIGH RISK  02/23/2020 (Originally 10/12/1967)  . MAMMOGRAM  08/29/2019  . FOOT EXAM  02/23/2020  . PAP SMEAR-Modifier  09/11/2020  . COLONOSCOPY   12/02/2024  . TETANUS/TDAP  09/28/2027  . HIV Screening  Completed    The following portions of the patient's history were reviewed and updated as appropriate:  She  has a past medical history of Anemia, Anxiety, Arthritis, Asthma (5 yrs ago ), Chronic lower back pain, Depression, History of hiatal hernia, History of kidney stones, Hyperlipidemia, Hypothyroidism, LBBB (left bundle branch block), Migraine, Positive TB test (2008), Shortness of breath, Thyroid disease, and Type II diabetes mellitus ([place]) (dx'd 2016). She does not have any pertinent problems on file. She  has a past surgical history that includes Endometrial ablation; Tubal ligation; laparoscopy; Dilation and curettage of uterus (1999); Thyroidectomy (09/28/2011); Thyroidectomy (09/28/2016); and Thyroidectomy (Left, 09/28/2016). Her family history includes Breast cancer in her maternal aunt; Breast cancer (age of onset: 40) in her maternal grandmother; Colon cancer in her paternal grandfather; Colon polyps in her father; Diabetes in her brother, mother, and sister; Gallbladder disease in her maternal grandmother and mother; Heart attack in her brother; Heart disease in her mother and another family member; Hyperlipidemia in her father and mother; Hypertension in her father and mother; Ovarian cancer in her maternal grandmother. She  reports that she has never smoked. She has never used smokeless tobacco. She reports that she does not drink alcohol or use drugs. She has a current medication list which includes the following prescription(s): acetaminophen, alprazolam, aspirin ec, blood glucose meter kit and supplies, vitamin d3, citalopram, vitamin b-12, flaxseed (linseed), fluticasone, fluticasone, gabapentin, ginkgo biloba extract, glimepiride, glucose blood, hydrochlorothiazide, levocetirizine, levothyroxine, losartan, fish oil, onetouch delica lancets fine, pantoprazole, rosuvastatin, and sitagliptin-metformin. Current Outpatient Medications  on File Prior to Visit  Medication Sig Dispense Refill  . acetaminophen (TYLENOL) 500 MG tablet Take 1,000 mg by mouth every 6 (six) hours as needed for mild pain.    Marland Kitchen ALPRAZolam (XANAX) 0.5 MG tablet Take 1 tablet (0.5 mg total) by mouth 3 (three) times daily as needed for anxiety. 10 tablet 0  . aspirin EC 81 MG tablet Take 1 tablet (81 mg total) by mouth daily. 90 tablet 3  . blood glucose meter kit and supplies KIT Dispense based on patient and insurance preference. Use up to four times daily as directed. (FOR ICD-9 250.00, 250.01). 1 each 0  . Cholecalciferol (VITAMIN D3) 2000 units TABS Take 2,000 Units by mouth daily.     . citalopram (CELEXA) 20 MG tablet Take 1 tablet (20 mg total) by mouth daily. 90 tablet 3  . cyanocobalamin 500 MCG tablet Take 500 mcg by mouth daily.    . Flaxseed, Linseed, (FLAXSEED OIL PO) Take 1,300 mg by mouth daily.    . fluticasone (FLONASE) 50 MCG/ACT nasal spray Place 2 sprays into both nostrils daily. 16 g 6  . fluticasone (FLONASE) 50 MCG/ACT nasal spray Place 2 sprays into both nostrils daily. 16 g 6  . gabapentin (NEURONTIN) 300 MG capsule Take 1 capsule (300 mg total) by mouth 3 (three) times daily. Can start with 1 capsule at nighttime and increase to taking 1 capsule 3 times daily. 90 capsule 0  . Ginkgo Biloba Extract 60 MG CAPS Take 60 mg by mouth daily.    Marland Kitchen glimepiride (AMARYL) 2 MG tablet Take 1 tablet (2 mg total) by mouth daily before breakfast. 90 tablet 1  . glucose blood (ONETOUCH VERIO) test strip Onetouch Verio Check blood sugar twice daily 100 each 12  . hydrochlorothiazide (HYDRODIURIL) 25 MG tablet Take 1 tablet (25 mg total) by mouth daily. 30 tablet 2  . levocetirizine (XYZAL) 5 MG tablet Take 1 tablet (5 mg total) by mouth every evening. 30 tablet 5  . levothyroxine (SYNTHROID) 100 MCG tablet Take 1 tablet (100 mcg total) by mouth daily. 30 tablet 6  . losartan (COZAAR) 25 MG tablet Take 1 tablet by mouth once daily 90 tablet 1  .  Omega-3 Fatty Acids (FISH OIL) 1200 MG CAPS Take 1,200 mg by mouth daily.    Glory Rosebush DELICA LANCETS FINE MISC Check blood sugar twice daily 100 each 12  . pantoprazole (PROTONIX) 40 MG tablet Take 1 tablet (40 mg total) by mouth daily. 90 tablet 3  . rosuvastatin (CRESTOR) 20 MG tablet Take 1 tablet (20 mg total) by mouth daily. 90 tablet 1  . sitaGLIPtin-metformin (JANUMET) 50-1000 MG tablet Take 2 tablets by mouth daily with breakfast. 60 tablet 2   No current facility-administered medications on file prior to visit.    She is allergic to lisinopril..  Review of Systems Review of Systems  Constitutional: Negative for activity change, appetite change and fatigue.  HENT: Negative for hearing loss, congestion, tinnitus and ear discharge.  dentist q28mEyes: Negative for visual  disturbance (see optho q1y -- vision corrected to 20/20 with glasses).  Respiratory: Negative for cough, chest tightness and shortness of breath.   Cardiovascular: Negative for chest pain, palpitations and leg swelling.  Gastrointestinal: Negative for abdominal pain, diarrhea, constipation and abdominal distention.  Genitourinary: Negative for urgency, frequency, decreased urine volume and difficulty urinating.  Musculoskeletal: Negative for back pain, arthralgias and gait problem.  Skin: Negative for color change, pallor and rash.  Neurological: Negative for dizziness, light-headedness, numbness and headaches.  Hematological: Negative for adenopathy. Does not bruise/bleed easily.  Psychiatric/Behavioral: Negative for suicidal ideas, confusion, sleep disturbance, self-injury, dysphoric mood, decreased concentration and agitation.       Objective:    BP 107/62 (BP Location: Left Arm, Cuff Size: Large)   Pulse 79   Temp (!) 97.5 F (36.4 C) (Temporal)   Resp 12   Ht '5\' 6"'$  (1.676 m)   Wt 220 lb (99.8 kg)   SpO2 100%   BMI 35.51 kg/m  General appearance: alert, cooperative, appears stated age and no  distress Head: Normocephalic, without obvious abnormality, atraumatic Eyes: conjunctivae/corneas clear. PERRL, EOM's intact. Fundi benign. Ears: normal TM's and external ear canals both ears Nose: Nares normal. Septum midline. Mucosa normal. No drainage or sinus tenderness. Throat: lips, mucosa, and tongue normal; teeth and gums normal Neck: no adenopathy, no carotid bruit, no JVD, supple, symmetrical, trachea midline and thyroid not enlarged, symmetric, no tenderness/mass/nodules Back: symmetric, no curvature. ROM normal. No CVA tenderness. Lungs: clear to auscultation bilaterally Breasts: normal appearance, no masses or tenderness--gyn Heart: regular rate and rhythm, S1, S2 normal, no murmur, click, rub or gallop Abdomen: soft, non-tender; bowel sounds normal; no masses,  no organomegaly Pelvic: deferred--gyn Extremities: extremities normal, atraumatic, no cyanosis or edema Pulses: 2+ and symmetric Skin: Skin color, texture, turgor normal. No rashes or lesions Lymph nodes: Cervical, supraclavicular, and axillary nodes normal. Neurologic: Alert and oriented X 3, normal strength and tone. Normal symmetric reflexes. Normal coordination and gait    Diabetic Foot Exam - Simple   Simple Foot Form Diabetic Foot exam was performed with the following findings: Yes 02/23/2019  4:55 PM  Visual Inspection No deformities, no ulcerations, no other skin breakdown bilaterally: Yes Sensation Testing Intact to touch and monofilament testing bilaterally: Yes Pulse Check Posterior Tibialis and Dorsalis pulse intact bilaterally: Yes Comments     Assessment:    Healthy female exam.      Plan:    ghm utd  Check labs See After Visit Summary for Counseling Recommendations    1. Uncontrolled type 2 diabetes mellitus with hyperglycemia ([place]) hgba1c to be checked , minimize simple carbs. Increase exercise as tolerated. Continue current meds  - Lipid panel - Hemoglobin A1c - CBC with  Differential/Platelet - TSH - Comprehensive metabolic panel  2. Hyperlipidemia associated with type 2 diabetes mellitus ([place]) Tolerating statin, encouraged heart healthy diet, avoid trans fats, minimize simple carbs and saturated fats. Increase exercise as tolerated - Lipid panel - Hemoglobin A1c - CBC with Differential/Platelet - TSH - Comprehensive metabolic panel  3. Essential hypertension Well controlled, no changes to meds. Encouraged heart healthy diet such as the DASH diet and exercise as tolerated.   - Lipid panel - Hemoglobin A1c - CBC with Differential/Platelet - TSH - Comprehensive metabolic panel  4. Preventative health care ghm utd Check labs  - Lipid panel - Hemoglobin A1c - CBC with Differential/Platelet - TSH - Comprehensive metabolic panel  5. Pelvic pain Check [place]  - US PELVIS (TRANSABDOMINAL  ONLY); Future - US PELVIS TRANSVAGINAL NON-OB (TV ONLY); Future

## 2019-02-23 NOTE — Assessment & Plan Note (Signed)
Check [place]  F/u gyn

## 2019-02-23 NOTE — Patient Instructions (Signed)

## 2019-02-24 ENCOUNTER — Encounter: Payer: Self-pay | Admitting: Orthopaedic Surgery

## 2019-02-24 ENCOUNTER — Ambulatory Visit (INDEPENDENT_AMBULATORY_CARE_PROVIDER_SITE_OTHER): Payer: BLUE CROSS/BLUE SHIELD | Admitting: Orthopaedic Surgery

## 2019-02-24 VITALS — BP 119/76 | HR 79 | Ht 66.0 in | Wt 220.0 lb

## 2019-02-24 DIAGNOSIS — M5136 Other intervertebral disc degeneration, lumbar region: Secondary | ICD-10-CM | POA: Diagnosis not present

## 2019-02-24 DIAGNOSIS — G8929 Other chronic pain: Secondary | ICD-10-CM

## 2019-02-24 DIAGNOSIS — M5020 Other cervical disc displacement, unspecified cervical region: Secondary | ICD-10-CM | POA: Diagnosis not present

## 2019-02-24 DIAGNOSIS — M545 Low back pain: Secondary | ICD-10-CM

## 2019-02-24 LAB — CBC WITH DIFFERENTIAL/PLATELET
Basophils Absolute: 0.1 10*3/uL (ref 0.0–0.1)
Basophils Relative: 0.8 % (ref 0.0–3.0)
Eosinophils Absolute: 0.1 10*3/uL (ref 0.0–0.7)
Eosinophils Relative: 1.5 % (ref 0.0–5.0)
HCT: 40.2 % (ref 36.0–46.0)
Hemoglobin: 13.2 g/dL (ref 12.0–15.0)
Lymphocytes Relative: 39 % (ref 12.0–46.0)
Lymphs Abs: 3.5 10*3/uL (ref 0.7–4.0)
MCHC: 32.9 g/dL (ref 30.0–36.0)
MCV: 85.8 fl (ref 78.0–100.0)
Monocytes Absolute: 0.7 10*3/uL (ref 0.1–1.0)
Monocytes Relative: 7.2 % (ref 3.0–12.0)
Neutro Abs: 4.7 10*3/uL (ref 1.4–7.7)
Neutrophils Relative %: 51.5 % (ref 43.0–77.0)
Platelets: 297 10*3/uL (ref 150.0–400.0)
RBC: 4.69 Mil/uL (ref 3.87–5.11)
RDW: 13.6 % (ref 11.5–15.5)
WBC: 9 10*3/uL (ref 4.0–10.5)

## 2019-02-24 LAB — LIPID PANEL
Cholesterol: 135 mg/dL (ref 0–200)
HDL: 39.5 mg/dL (ref >39.00)
LDL Cholesterol: 62 mg/dL (ref 0–99)
NonHDL: 95.2
Total CHOL/HDL Ratio: 3
Triglycerides: 166 mg/dL — ABNORMAL HIGH (ref 0.0–149.0)
VLDL: 33.2 mg/dL (ref 0.0–40.0)

## 2019-02-24 LAB — COMPREHENSIVE METABOLIC PANEL
ALT: 18 U/L (ref 0–35)
AST: 16 U/L (ref 0–37)
Albumin: 4.6 g/dL (ref 3.5–5.2)
Alkaline Phosphatase: 44 U/L (ref 39–117)
BUN: 13 mg/dL (ref 6–23)
CO2: 30 mEq/L (ref 19–32)
Calcium: 9.7 mg/dL (ref 8.4–10.5)
Chloride: 107 mEq/L (ref 96–112)
Creatinine, Ser: 0.51 mg/dL (ref 0.40–1.20)
GFR: 151.85 mL/min (ref >60.00)
Glucose, Bld: 81 mg/dL (ref 70–99)
Potassium: 4 mEq/L (ref 3.5–5.1)
Sodium: 146 mEq/L — ABNORMAL HIGH (ref 135–145)
Total Bilirubin: 0.4 mg/dL (ref 0.2–1.2)
Total Protein: 7.2 g/dL (ref 6.0–8.3)

## 2019-02-24 LAB — HEMOGLOBIN A1C: Hgb A1c MFr Bld: 7.9 % — ABNORMAL HIGH (ref 4.6–6.5)

## 2019-02-24 LAB — TSH: TSH: 0.48 u[IU]/mL (ref 0.35–4.50)

## 2019-02-24 NOTE — Progress Notes (Signed)
Office Visit Note   Patient: Felicia Solis           Date of Birth: 28-Apr-1966           MRN: [id number] Visit Date: 02/24/2019              Requested by: [address], [place], [place] [address] [place],  [place] [address] PCP: Carollee Herter, Alferd Apa, DO   Assessment & Plan: Visit Diagnoses:  1. Chronic bilateral low back pain, unspecified whether sciatica present   2. Other intervertebral disc degeneration, lumbar region   3. HNP (herniated nucleus pulposus), cervical     Plan: Patient with chronic back pain failed physical therapy, Tylenol, ibuprofen, muscle relaxants.  She has had persistent symptoms and I recommend proceeding with lumbar MRI scan.  Office follow-up after scan for review.  Follow-Up Instructions: Office follow-up after lumbar MRI scan  Orders:  Orders Placed This Encounter  Procedures   MR Lumbar Spine w/o contrast   No orders of the defined types were placed in this encounter.     Procedures: No procedures performed   Clinical Data: No additional findings.   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  Back pain is lumbosacral junction radiates into her legs and Tylenol ibuprofen regularly helps her legs but not her back pain.  She has pain that radiates worse down to her left heel and also pain that radiates into the calf posterior and laterally on the right.  She is continuing regular work.  Cervical MRI showed moderate sized disc extrusion C6-7 right with some hemicord pressure but she is not really having persistent problems with right arm symptoms and the numbness and weakness in her arm has improved.  She denies fever chills no bowel or bladder symptoms.  She has had several therapy visits for back and helps short time but then she has recurrence  of her symptoms.  Review of Systems 14 system update positive for depression anxiety cervical disc extrusion right C6-7 and chronic low back pain.  History of dysphasia thyroid nodule otherwise negative as it pertains HPI.   Objective: Vital Signs: BP 119/76    Pulse 79    Ht 5\' 6"  (1.676 m)    Wt 220 lb (99.8 kg)    BMI 35.51 kg/m   Physical Exam Constitutional:      Appearance: She is well-developed.  HENT:     Head: Normocephalic.     Right Ear: External ear normal.     Left Ear: External ear normal.  Eyes:     Pupils: Pupils are equal, round, and reactive to light.  Neck:     Thyroid: No thyromegaly.     Trachea: No tracheal deviation.  Cardiovascular:     Rate and Rhythm: Normal rate.  Pulmonary:     Effort: Pulmonary effort is normal.  Abdominal:     Palpations: Abdomen is soft.  Skin:    General: Skin is warm and dry.  Neurological:     Mental Status: She is alert and oriented to person, place, and time.  Psychiatric:        Behavior: Behavior normal.     Ortho Exam patient has mild right brachial plexus tenderness upper extremity reflexes are 2+  and symmetrical no triceps wrist flexion and finger extension weakness.  Some pain with straight leg raising right and left with bilateral sciatic notch tenderness positive popliteal compression test at 90 degrees.  She is able to heel and toe walk no atrophy distal pulses are intact.  Specialty Comments:  No specialty comments available.  Imaging: Lumbar x-ray 01/02/2019 showed normal curvature no significant disc space narrowing mild facet arthropathy L4-5 L5-S1.  Negative for acute changes.   PMFS History: Patient Active Problem List   Diagnosis Date Noted   Pelvic pain 02/23/2019   HNP (herniated nucleus pulposus), cervical 01/04/2019   Epigastric mass 10/21/2018   Myalgia 03/18/2018   Hyperlipidemia associated with type 2 diabetes mellitus ([place]) 03/18/2018   Nausea and vomiting 12/02/2017   Hematuria  12/02/2017   Pansinusitis 11/19/2017   Agoraphobia with panic attacks 11/19/2017   Preventative health care 09/27/2017   Hyperlipidemia LDL goal <100 09/18/2017   Vitamin D deficiency 09/18/2017   Hypothyroidism 09/18/2017   Other intervertebral disc degeneration, lumbar region 04/07/2017   Left hip pain 04/07/2017   Hyperlipidemia LDL goal <70 04/07/2017   Essential hypertension 04/07/2017   Acute vaginitis 04/07/2017   S/P complete thyroidectomy 09/28/2016   Memory loss 08/23/2016   Thyroid nodule 08/02/2016   Plantar fasciitis of right foot 12/01/2015   Knee pain, right 12/01/2015   Headache, migraine 09/06/2015   Bilateral knee pain 08/22/2015   Thoracic myofascial strain 08/22/2015   Dysphagia 04/10/2015   Chest pain 04/10/2015   Epigastric pain 04/10/2015   Functional diarrhea 04/10/2015   NSAID long-term use 04/10/2015   Midepigastric pain 03/22/2015   Cough 01/07/2015   Allergic rhinitis 12/13/2014   Acute bronchitis 12/13/2014   Blood in stool 10/01/2014   DM (diabetes mellitus) type II uncontrolled, periph vascular disorder ([place]) 08/13/2014   Abnormal ECG 05/14/2012   Breast pain 10/23/2011   ASCUS on Pap smear 10/08/2011   H/O thyroid nodule 08/27/2011   Depression with anxiety 08/27/2011   Heavy periods 08/27/2011   Past Medical History:  Diagnosis Date   Anemia    anemia  when pregnant in 2002   Anxiety    Arthritis    "knees and back" (09/28/2016)   Asthma 5 yrs ago    told she had asthma.. does not remember doctors name.    Chronic lower back pain    Depression    takes citalopram   History of hiatal hernia    History of kidney stones    Hyperlipidemia    Hypothyroidism    LBBB (left bundle branch block)    rate-related; identified on stress test 03/21/16   Migraine    "weekly" (09/28/2016)   Positive TB test 2008   pt states she took 9 months of medicine for positive tb skin test   Shortness of  breath    with exertion   Thyroid disease    Type II diabetes mellitus ([place]) dx'd 2016    Family History  Problem Relation Age of Onset   Diabetes Mother    Hypertension Mother    Hyperlipidemia Mother    Gallbladder disease Mother    Heart disease Mother    Diabetes Sister    Hypertension Father    Hyperlipidemia Father    Colon polyps Father    Ovarian cancer Maternal Grandmother    Gallbladder disease Maternal Grandmother    Breast cancer Maternal Grandmother 29   Colon cancer Paternal Grandfather    Breast cancer Maternal Aunt  late 70s   Heart disease Other        early cad   Diabetes Brother    Heart attack Brother    Anesthesia problems Neg Hx    Hypotension Neg Hx    Malignant hyperthermia Neg Hx    Pseudochol deficiency Neg Hx    Esophageal cancer Neg Hx     Past Surgical History:  Procedure Laterality Date   DILATION AND CURETTAGE OF UTERUS  1999   ENDOMETRIAL ABLATION     LAPAROSCOPY     years ago ..pt does not know where surgery was done ... states" my stomach would blow up then go down"   THYROIDECTOMY  09/28/2011   Procedure: THYROIDECTOMY;  Surgeon: Izora Gala, MD;  Location: [facility];  Service: ENT;  Laterality: Right;  RIGHT THYROIDECTOMY WITH FROZEN SECTION   THYROIDECTOMY  09/28/2016   completion of thyroidectomy/notes 09/28/2016   THYROIDECTOMY Left 09/28/2016   Procedure: COMPLETION OF THYROIDECTOMY;  Surgeon: Izora Gala, MD;  Location: [facility];  Service: ENT;  Laterality: Left;   TUBAL LIGATION     Social History   Occupational History   Occupation: CNA  Tobacco Use   Smoking status: Never Smoker   Smokeless tobacco: Never Used  Substance and Sexual Activity   Alcohol use: No    Alcohol/week: 0.0 standard drinks   Drug use: No   Sexual activity: Not on file

## 2019-02-25 ENCOUNTER — Encounter (HOSPITAL_BASED_OUTPATIENT_CLINIC_OR_DEPARTMENT_OTHER): Payer: Self-pay

## 2019-02-25 ENCOUNTER — Ambulatory Visit (HOSPITAL_BASED_OUTPATIENT_CLINIC_OR_DEPARTMENT_OTHER): Payer: BLUE CROSS/BLUE SHIELD

## 2019-02-25 ENCOUNTER — Ambulatory Visit (HOSPITAL_BASED_OUTPATIENT_CLINIC_OR_DEPARTMENT_OTHER)
Admission: RE | Admit: 2019-02-25 | Discharge: 2019-02-25 | Disposition: A | Discharge status: Home / Self Care | Payer: BLUE CROSS/BLUE SHIELD | Source: Ambulatory Visit | Attending: Family Medicine | Admitting: Family Medicine

## 2019-02-25 ENCOUNTER — Other Ambulatory Visit: Payer: Self-pay

## 2019-02-25 DIAGNOSIS — U071 COVID-19: Secondary | ICD-10-CM | POA: Diagnosis not present

## 2019-02-25 DIAGNOSIS — R102 Pelvic and perineal pain: Secondary | ICD-10-CM

## 2019-02-27 ENCOUNTER — Ambulatory Visit (HOSPITAL_BASED_OUTPATIENT_CLINIC_OR_DEPARTMENT_OTHER): Payer: BLUE CROSS/BLUE SHIELD

## 2019-03-01 ENCOUNTER — Other Ambulatory Visit: Payer: Self-pay | Admitting: Family Medicine

## 2019-03-01 DIAGNOSIS — E785 Hyperlipidemia, unspecified: Secondary | ICD-10-CM

## 2019-03-01 DIAGNOSIS — E1165 Type 2 diabetes mellitus with hyperglycemia: Secondary | ICD-10-CM

## 2019-03-01 DIAGNOSIS — E1169 Type 2 diabetes mellitus with other specified complication: Secondary | ICD-10-CM

## 2019-03-03 ENCOUNTER — Other Ambulatory Visit (HOSPITAL_BASED_OUTPATIENT_CLINIC_OR_DEPARTMENT_OTHER): Payer: BLUE CROSS/BLUE SHIELD

## 2019-03-04 ENCOUNTER — Ambulatory Visit (HOSPITAL_BASED_OUTPATIENT_CLINIC_OR_DEPARTMENT_OTHER)
Admission: RE | Admit: 2019-03-04 | Discharge: 2019-03-04 | Disposition: A | Discharge status: Home / Self Care | Payer: BLUE CROSS/BLUE SHIELD | Source: Ambulatory Visit | Attending: Family Medicine | Admitting: Family Medicine

## 2019-03-04 ENCOUNTER — Other Ambulatory Visit: Payer: Self-pay

## 2019-03-04 DIAGNOSIS — D259 Leiomyoma of uterus, unspecified: Secondary | ICD-10-CM | POA: Diagnosis not present

## 2019-03-04 DIAGNOSIS — R102 Pelvic and perineal pain: Secondary | ICD-10-CM | POA: Diagnosis not present

## 2019-03-04 DIAGNOSIS — U071 COVID-19: Secondary | ICD-10-CM | POA: Diagnosis not present

## 2019-03-05 ENCOUNTER — Other Ambulatory Visit: Payer: Self-pay

## 2019-03-05 DIAGNOSIS — D259 Leiomyoma of uterus, unspecified: Secondary | ICD-10-CM

## 2019-03-05 MED ORDER — METFORMIN HCL ER 500 MG PO TB24: ORAL_TABLET | ORAL | 2 refills | Status: DC

## 2019-03-05 MED ORDER — RYBELSUS 3 MG PO TABS: 1.0000 | ORAL_TABLET | Freq: Every day | ORAL | 2 refills | Status: DC

## 2019-03-11 ENCOUNTER — Telehealth: Payer: Self-pay

## 2019-03-11 DIAGNOSIS — Z20828 Contact with and (suspected) exposure to other viral communicable diseases: Secondary | ICD-10-CM | POA: Diagnosis not present

## 2019-03-11 NOTE — Telephone Encounter (Signed)
Pt states she doesn't think the medication is causing her symptoms. She has a virtual with you tomorrow and she is being tested for COVID via work

## 2019-03-11 NOTE — Telephone Encounter (Signed)
She was on metformin before so is she talking about rybelsus?

## 2019-03-11 NOTE — Telephone Encounter (Signed)
Copied from [place] [phone]. Topic: General - Other >> Mar 11, 2019  9:56 AM Rainey Pines A wrote: Patient is returning [place] call and would like a callback   ** Pt states having nausea, vomiting, and diarrhea. Virtual set up for tomorrow. Pt having COVID testing down through work.**

## 2019-03-11 NOTE — Telephone Encounter (Signed)
Copied from [place] [phone]. Topic: Quick Communication - See Telephone Encounter >> Mar 10, 2019  4:51 PM Loma [place] wrote: CRM for notification. See Telephone encounter for: 03/10/19. Pt is not clear on the medicine changing that was on last Friday. She says that she would feel better to continue if she could talk to [place]. She thinks it may be making her sick [phone]  **Left VM for patient to call back**

## 2019-03-11 NOTE — Telephone Encounter (Signed)
Ok

## 2019-03-12 ENCOUNTER — Ambulatory Visit (INDEPENDENT_AMBULATORY_CARE_PROVIDER_SITE_OTHER): Payer: BLUE CROSS/BLUE SHIELD | Admitting: Family Medicine

## 2019-03-12 ENCOUNTER — Other Ambulatory Visit: Payer: Self-pay

## 2019-03-12 ENCOUNTER — Encounter: Payer: Self-pay | Admitting: Family Medicine

## 2019-03-12 DIAGNOSIS — E1165 Type 2 diabetes mellitus with hyperglycemia: Secondary | ICD-10-CM | POA: Diagnosis not present

## 2019-03-12 DIAGNOSIS — K529 Noninfective gastroenteritis and colitis, unspecified: Secondary | ICD-10-CM | POA: Diagnosis not present

## 2019-03-12 DIAGNOSIS — R112 Nausea with vomiting, unspecified: Secondary | ICD-10-CM

## 2019-03-12 DIAGNOSIS — IMO0002 Reserved for concepts with insufficient information to code with codable children: Secondary | ICD-10-CM

## 2019-03-12 DIAGNOSIS — E1151 Type 2 diabetes mellitus with diabetic peripheral angiopathy without gangrene: Secondary | ICD-10-CM

## 2019-03-12 MED ORDER — ONETOUCH DELICA LANCETS 30G MISC: 1.0000 | Freq: Two times a day (BID) | 5 refills | Status: DC | PRN

## 2019-03-12 MED ORDER — ONDANSETRON HCL 4 MG PO TABS: 4.0000 mg | ORAL_TABLET | Freq: Three times a day (TID) | ORAL | 0 refills | Status: DC | PRN

## 2019-03-12 MED ORDER — ONETOUCH VERIO VI STRP: ORAL_STRIP | 12 refills | Status: DC

## 2019-03-12 NOTE — Progress Notes (Signed)
Virtual Visit via Telephone Note  I connected with Felicia Solis on 03/12/19 at 10:20 AM EDT by telephone and verified that I am speaking with the correct person using two identifiers.  Location: Patient: work Provider: office   I discussed the limitations, risks, security and privacy concerns of performing an evaluation and management service by telephone and the availability of in person appointments. I also discussed with the patient that there may be a patient responsible charge related to this service. The patient expressed understanding and agreed to proceed.   History of Present Illness: Pt is home c/o NVD ----x 1 week--- last episode 2 days ago but now she is nauseous and has diarrhea ---- no diarrhea today   Observations/Objective: Afebrile ,  No other vitals obtained  bs 86 Pt is in nad  Assessment and Plan: 1. Nausea and vomiting, intractability of vomiting not specified, unspecified vomiting type Brat diet  G2, pedialyte ice pops  - ondansetron (ZOFRAN) 4 MG tablet; Take 1 tablet (4 mg total) by mouth every 8 (eight) hours as needed for nausea or vomiting.  Dispense: 20 tablet; Refill: 0  2. Gastroenteritis Brat diet F/u in 2-3 days  - ondansetron (ZOFRAN) 4 MG tablet; Take 1 tablet (4 mg total) by mouth every 8 (eight) hours as needed for nausea or vomiting.  Dispense: 20 tablet; Refill: 0  3. Uncontrolled type 2 diabetes mellitus with hyperglycemia ([facility]) Check labs   - OneTouch Delica Lancets [id number] MISC; 1 Device by Does not apply route 2 (two) times daily as needed.  Dispense: 60 each; Refill: 5  4. DM (diabetes mellitus) type II uncontrolled, periph vascular disorder ([place]) hgba1c to be checked , minimize simple carbs. Increase exercise as tolerated. Continue current meds  - glucose blood (ONETOUCH VERIO) test strip; Onetouch Verio Check blood sugar twice daily  Dispense: 100 each; Refill: 12   Follow Up Instructions:    I discussed the assessment and  treatment plan with the patient. The patient was provided an opportunity to ask questions and all were answered. The patient agreed with the plan and demonstrated an understanding of the instructions.   The patient was advised to call back or seek an in-person evaluation if the symptoms worsen or if the condition fails to improve as anticipated.  I provided 15 minutes of non-face-to-face time during this encounter.   [name], DO

## 2019-03-13 ENCOUNTER — Telehealth: Payer: Self-pay | Admitting: Orthopaedic Surgery

## 2019-03-13 NOTE — Telephone Encounter (Signed)
Patient called and stated she is claustrophobic and need a Rx called in for her to do MRI on 03/17/19.  Please call patient to advise.  [phone]

## 2019-03-15 ENCOUNTER — Other Ambulatory Visit: Payer: Self-pay

## 2019-03-15 ENCOUNTER — Ambulatory Visit
Admission: RE | Admit: 2019-03-15 | Discharge: 2019-03-15 | Disposition: A | Discharge status: Home / Self Care | Payer: BLUE CROSS/BLUE SHIELD | Source: Ambulatory Visit | Attending: Orthopaedic Surgery | Admitting: Orthopaedic Surgery

## 2019-03-15 DIAGNOSIS — M5126 Other intervertebral disc displacement, lumbar region: Secondary | ICD-10-CM | POA: Diagnosis not present

## 2019-03-15 DIAGNOSIS — G8929 Other chronic pain: Secondary | ICD-10-CM

## 2019-03-16 MED ORDER — DIAZEPAM 5 MG PO TABS: ORAL_TABLET | ORAL | 0 refills | Status: DC

## 2019-03-16 NOTE — Telephone Encounter (Signed)
Valium called to pharmacy. I left detailed message for patient advising how to take medication and to have a driver.

## 2019-03-17 ENCOUNTER — Encounter: Payer: Self-pay | Admitting: Orthopaedic Surgery

## 2019-03-17 ENCOUNTER — Ambulatory Visit (INDEPENDENT_AMBULATORY_CARE_PROVIDER_SITE_OTHER): Payer: BLUE CROSS/BLUE SHIELD | Admitting: Orthopaedic Surgery

## 2019-03-17 VITALS — BP 138/85 | HR 80 | Ht 66.5 in | Wt 220.0 lb

## 2019-03-17 DIAGNOSIS — M5136 Other intervertebral disc degeneration, lumbar region: Secondary | ICD-10-CM | POA: Diagnosis not present

## 2019-03-17 NOTE — Progress Notes (Signed)
Office Visit Note   Patient: Felicia Solis           Date of Birth: Apr 18, 1966           MRN: [id number] Visit Date: 03/17/2019              Requested by: [address] [place], [place] [address] [place],  [place] [address] PCP: Carollee Herter, Alferd Apa, DO   Assessment & Plan: Visit Diagnoses:  1. Other intervertebral disc degeneration, lumbar region     Plan: Patient has some mild disc degeneration with bulge at L3-4 with some facet arthropathy without compression.  No compressive lesions are present.  We discussed continued weight loss core strengthening occasional anti-inflammatory usage that she is already doing.  She can return if she develops radicular symptoms.  Copy of the MRI scan was given the patient images were reviewed in detail.  Follow-Up Instructions: Return if symptoms worsen or fail to improve.   Orders:  No orders of the defined types were placed in this encounter.  No orders of the defined types were placed in this encounter.     Procedures: No procedures performed   Clinical Data: No additional findings.   Subjective: Chief Complaint  Patient presents with  . Lower Back - Pain, Follow-up    MRI Lumbar Review    HPI 53 year old female returns with ongoing problems with chronic low back pain.  Pain radiates into her legs she has been through physical therapy, Tylenol, ibuprofen, exercise program with core strengthening.  She is continue working regularly.  She has a cervical disc at C6-7 on the right with mild symptoms despite some hemicord pressure.  She denies bowel bladder symptoms no fever or chills.  Review of Systems 14 point update unchanged from 02/24/2019 office visit other than as mentioned HPI.   Objective: Vital Signs: BP 138/85   Pulse 80   Ht 5' 6.5" (1.689 m)   Wt 220 lb (99.8 kg)   BMI 34.98 kg/m   Physical Exam Constitutional:      Appearance: She is well-developed.  HENT:     Head: Normocephalic.     Right Ear:  External ear normal.     Left Ear: External ear normal.  Eyes:     Pupils: Pupils are equal, round, and reactive to light.  Neck:     Thyroid: No thyromegaly.     Trachea: No tracheal deviation.  Cardiovascular:     Rate and Rhythm: Normal rate.  Pulmonary:     Effort: Pulmonary effort is normal.  Abdominal:     Palpations: Abdomen is soft.  Skin:    General: Skin is warm and dry.  Neurological:     Mental Status: She is alert and oriented to person, place, and time.  Psychiatric:        Behavior: Behavior normal.     Ortho Exam patient has good cervical range of motion.  No weakness in upper extremities.  Negative straight leg raising 90 degrees.  Lumbar tenderness.  She is able to heel and toe walk anterior tib gastrocsoleus is strong negative logroll to the hips distal pulses are intact. Specialty Comments:  No specialty comments available.  Imaging: CLINICAL DATA:  Lower back pain radiating down bilateral legs  EXAM: MRI LUMBAR SPINE WITHOUT CONTRAST  TECHNIQUE: Multiplanar, multisequence MR imaging of the lumbar spine was performed. No intravenous contrast was administered.  COMPARISON:  None.  FINDINGS: Segmentation:  Standard.  Alignment:  1 mm anterolisthesis of L3 on L4 secondary to facet disease.  Vertebrae:  No fracture, evidence of discitis, or bone lesion.  Conus medullaris and cauda equina: Conus extends to the L1 level. Conus and cauda equina appear normal.  Paraspinal and other soft tissues: No acute paraspinal abnormality.  Disc levels:  Disc spaces: Disc spaces are maintained. Mild disc desiccation at L3-4 and L5-S1.  T12-L1: No significant disc bulge. No evidence of neural foraminal stenosis. No central canal stenosis.  L1-L2: No significant disc bulge. No evidence of neural foraminal stenosis. No central canal stenosis.  L2-L3: No significant disc bulge. No evidence of neural foraminal stenosis. No central canal stenosis.   L3-L4: Mild broad-based disc bulge. Mild bilateral facet arthropathy. No evidence of neural foraminal stenosis. No central canal stenosis.  L4-L5: No significant disc bulge. No evidence of neural foraminal stenosis. No central canal stenosis.  L5-S1: No significant disc bulge. No evidence of neural foraminal stenosis. No central canal stenosis.  IMPRESSION: 1. At L3-4 there is a mild broad-based disc bulge with mild bilateral facet arthropathy. No significant lumbar spine disc protrusion, foraminal stenosis or central canal stenosis.   Electronically Signed   By: Kathreen Devoid   On: 03/15/2019 09:40   PMFS History: Patient Active Problem List   Diagnosis Date Noted  . Pelvic pain 02/23/2019  . HNP (herniated nucleus pulposus), cervical 01/04/2019  . Epigastric mass 10/21/2018  . Myalgia 03/18/2018  . Hyperlipidemia associated with type 2 diabetes mellitus ([place]) 03/18/2018  . Nausea and vomiting 12/02/2017  . Hematuria 12/02/2017  . Pansinusitis 11/19/2017  . 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 Preventative health care 09/27/2017  . Hyperlipidemia LDL goal <100 09/18/2017  . Vitamin D deficiency 09/18/2017  . Hypothyroidism 09/18/2017  . Other intervertebral disc degeneration, lumbar region 04/07/2017  . Left hip pain 04/07/2017  . Hyperlipidemia LDL goal <70 04/07/2017  . Essential hypertension 04/07/2017  . Acute vaginitis 04/07/2017  . S/P complete thyroidectomy 09/28/2016  . Memory loss 08/23/2016  . Thyroid nodule 08/02/2016  . Plantar fasciitis of right foot 12/01/2015  . Knee pain, right 12/01/2015  . Headache, migraine 09/06/2015  . Bilateral knee pain 08/22/2015  . Thoracic myofascial strain 08/22/2015  . Dysphagia 04/10/2015  . Chest pain 04/10/2015  . Epigastric pain 04/10/2015  . Functional diarrhea 04/10/2015  . NSAID long-term use 04/10/2015  . Midepigastric pain 03/22/2015  . Cough 01/07/2015  . Allergic rhinitis 12/13/2014  .  Acute bronchitis 12/13/2014  . Blood in stool 10/01/2014  . DM (diabetes mellitus) type II uncontrolled, periph vascular disorder ([facility]) 08/13/2014  . Abnormal ECG 05/14/2012  . Breast pain 10/23/2011  . ASCUS on Pap smear 10/08/2011  . H/O thyroid nodule 08/27/2011  . Depression with anxiety 08/27/2011  . Heavy periods 08/27/2011   Past Medical History:  Diagnosis Date  . Anemia    anemia  when pregnant in 2002  . Anxiety   . Arthritis    "knees and back" (09/28/2016)  . Asthma 5 yrs ago    told she had asthma.. does not remember doctors name.   . Chronic lower back pain   . Depression    takes citalopram  . History of hiatal hernia   . History of kidney stones   . Hyperlipidemia   . Hypothyroidism   . LBBB (left bundle branch block)    rate-related; identified on stress test 03/21/16  . Migraine    "weekly" (09/28/2016)  . Positive TB test 2008  pt states she took 9 months of medicine for positive tb skin test  . Shortness of breath    with exertion  . Thyroid disease   . Type II diabetes mellitus ([place]) dx'd 2016    Family History  Problem Relation Age of Onset  . Diabetes Mother   . Hypertension Mother   . Hyperlipidemia Mother   . Gallbladder disease Mother   . Heart disease Mother   . Diabetes Sister   . Hypertension Father   . Hyperlipidemia Father   . Colon polyps Father   . Ovarian cancer Maternal Grandmother   . Gallbladder disease Maternal Grandmother   . Breast cancer Maternal Grandmother 11  . Colon cancer Paternal Grandfather   . Breast cancer Maternal Aunt        late 35s  . Heart disease Other        early cad  . Diabetes Brother   . Heart attack Brother   . Anesthesia problems Neg Hx   . Hypotension Neg Hx   . Malignant hyperthermia Neg Hx   . Pseudochol deficiency Neg Hx   . Esophageal cancer Neg Hx     Past Surgical History:  Procedure Laterality Date  . DILATION AND CURETTAGE OF UTERUS  1999  . ENDOMETRIAL ABLATION    . LAPAROSCOPY      years ago ..pt does not know where surgery was done ... states" my stomach would blow up then go down"  . THYROIDECTOMY  09/28/2011   Procedure: THYROIDECTOMY;  Surgeon: Izora Gala, MD;  Location: [facility];  Service: ENT;  Laterality: Right;  RIGHT THYROIDECTOMY WITH FROZEN SECTION  . THYROIDECTOMY  09/28/2016   completion of thyroidectomy/notes 09/28/2016  . THYROIDECTOMY Left 09/28/2016   Procedure: COMPLETION OF THYROIDECTOMY;  Surgeon: Izora Gala, MD;  Location: [facility];  Service: ENT;  Laterality: Left;  . TUBAL LIGATION     Social History   Occupational History  . Occupation: CNA  Tobacco Use  . Smoking status: Never Smoker  . Smokeless tobacco: Never Used  Substance and Sexual Activity  . Alcohol use: No    Alcohol/week: 0.0 standard drinks  . Drug use: No  . Sexual activity: Not on file

## 2019-03-18 DIAGNOSIS — Z20828 Contact with and (suspected) exposure to other viral communicable diseases: Secondary | ICD-10-CM | POA: Diagnosis not present

## 2019-03-18 DIAGNOSIS — U071 COVID-19: Secondary | ICD-10-CM | POA: Diagnosis not present

## 2019-03-23 ENCOUNTER — Ambulatory Visit (INDEPENDENT_AMBULATORY_CARE_PROVIDER_SITE_OTHER): Payer: BLUE CROSS/BLUE SHIELD | Admitting: Family Medicine

## 2019-03-23 ENCOUNTER — Other Ambulatory Visit: Payer: Self-pay

## 2019-03-23 DIAGNOSIS — E785 Hyperlipidemia, unspecified: Secondary | ICD-10-CM

## 2019-03-23 DIAGNOSIS — I10 Essential (primary) hypertension: Secondary | ICD-10-CM

## 2019-03-24 ENCOUNTER — Encounter: Payer: Self-pay | Admitting: Family Medicine

## 2019-03-24 ENCOUNTER — Ambulatory Visit (INDEPENDENT_AMBULATORY_CARE_PROVIDER_SITE_OTHER): Payer: BLUE CROSS/BLUE SHIELD | Admitting: Family Medicine

## 2019-03-24 ENCOUNTER — Ambulatory Visit (HOSPITAL_BASED_OUTPATIENT_CLINIC_OR_DEPARTMENT_OTHER)
Admission: RE | Admit: 2019-03-24 | Discharge: 2019-03-24 | Disposition: A | Discharge status: Home / Self Care | Payer: BLUE CROSS/BLUE SHIELD | Source: Ambulatory Visit | Attending: Family Medicine | Admitting: Family Medicine

## 2019-03-24 ENCOUNTER — Other Ambulatory Visit: Payer: Self-pay | Admitting: Family Medicine

## 2019-03-24 ENCOUNTER — Other Ambulatory Visit: Payer: Self-pay

## 2019-03-24 VITALS — BP 122/78 | HR 92 | Temp 97.1°F | Resp 18 | Ht 66.5 in | Wt 221.2 lb

## 2019-03-24 DIAGNOSIS — R109 Unspecified abdominal pain: Secondary | ICD-10-CM | POA: Diagnosis not present

## 2019-03-24 DIAGNOSIS — R1010 Upper abdominal pain, unspecified: Secondary | ICD-10-CM | POA: Diagnosis not present

## 2019-03-24 DIAGNOSIS — R3129 Other microscopic hematuria: Secondary | ICD-10-CM

## 2019-03-24 DIAGNOSIS — N200 Calculus of kidney: Secondary | ICD-10-CM

## 2019-03-24 DIAGNOSIS — N132 Hydronephrosis with renal and ureteral calculous obstruction: Secondary | ICD-10-CM | POA: Diagnosis not present

## 2019-03-24 DIAGNOSIS — R102 Pelvic and perineal pain: Secondary | ICD-10-CM

## 2019-03-24 LAB — CBC WITH DIFFERENTIAL/PLATELET
Basophils Absolute: 0 10*3/uL (ref 0.0–0.1)
Basophils Relative: 0.4 % (ref 0.0–3.0)
Eosinophils Absolute: 0.1 10*3/uL (ref 0.0–0.7)
Eosinophils Relative: 1.2 % (ref 0.0–5.0)
HCT: 40.2 % (ref 36.0–46.0)
Hemoglobin: 13.1 g/dL (ref 12.0–15.0)
Lymphocytes Relative: 43.2 % (ref 12.0–46.0)
Lymphs Abs: 3.6 10*3/uL (ref 0.7–4.0)
MCHC: 32.7 g/dL (ref 30.0–36.0)
MCV: 85.6 fl (ref 78.0–100.0)
Monocytes Absolute: 0.6 10*3/uL (ref 0.1–1.0)
Monocytes Relative: 7.1 % (ref 3.0–12.0)
Neutro Abs: 4 10*3/uL (ref 1.4–7.7)
Neutrophils Relative %: 48.1 % (ref 43.0–77.0)
Platelets: 270 10*3/uL (ref 150.0–400.0)
RBC: 4.69 Mil/uL (ref 3.87–5.11)
RDW: 13.6 % (ref 11.5–15.5)
WBC: 8.2 10*3/uL (ref 4.0–10.5)

## 2019-03-24 LAB — COMPREHENSIVE METABOLIC PANEL
ALT: 17 U/L (ref 0–35)
AST: 15 U/L (ref 0–37)
Albumin: 4.7 g/dL (ref 3.5–5.2)
Alkaline Phosphatase: 43 U/L (ref 39–117)
BUN: 11 mg/dL (ref 6–23)
CO2: 29 mEq/L (ref 19–32)
Calcium: 9.7 mg/dL (ref 8.4–10.5)
Chloride: 105 mEq/L (ref 96–112)
Creatinine, Ser: 0.49 mg/dL (ref 0.40–1.20)
GFR: 158.98 mL/min (ref >60.00)
Glucose, Bld: 108 mg/dL — ABNORMAL HIGH (ref 70–99)
Potassium: 3.7 mEq/L (ref 3.5–5.1)
Sodium: 143 mEq/L (ref 135–145)
Total Bilirubin: 0.7 mg/dL (ref 0.2–1.2)
Total Protein: 7.2 g/dL (ref 6.0–8.3)

## 2019-03-24 LAB — POC URINALSYSI DIPSTICK (AUTOMATED)
Bilirubin, UA: NEGATIVE
Glucose, UA: NEGATIVE
Ketones, UA: NEGATIVE
Leukocytes, UA: NEGATIVE
Nitrite, UA: NEGATIVE
Protein, UA: POSITIVE — AB
Spec Grav, UA: >=1.03 — AB (ref 1.010–1.025)
Urobilinogen, UA: 0.2 E.U./dL
pH, UA: 5 (ref 5.0–8.0)

## 2019-03-24 LAB — AMYLASE: Amylase: 34 U/L (ref 27–131)

## 2019-03-24 MED ORDER — HYDROCODONE-ACETAMINOPHEN 5-325 MG PO TABS: 1.0000 | ORAL_TABLET | Freq: Four times a day (QID) | ORAL | 0 refills | Status: DC | PRN

## 2019-03-24 NOTE — Patient Instructions (Signed)
Hematuria, Adult Hematuria is blood in the urine. Blood may be visible in the urine, or it may be identified with a test. This condition can be caused by infections of the bladder, urethra, kidney, or prostate. Other possible causes include:  Kidney stones.  Cancer of the urinary tract.  Too much calcium in the urine.  Conditions that are passed from parent to child (inherited conditions).  Exercise that requires a lot of energy. Infections can usually be treated with medicine, and a kidney stone usually will pass through your urine. If neither of these is the cause of your hematuria, more tests may be needed to identify the cause of your symptoms. It is very important to tell your health care provider about any blood in your urine, even if it is painless or the blood stops without treatment. Blood in the urine, when it happens and then stops and then happens again, can be a symptom of a very serious condition, including cancer. There is no pain in the initial stages of many urinary cancers. Follow these instructions at home: Medicines  Take over-the-counter and prescription medicines only as told by your health care provider.  If you were prescribed an antibiotic medicine, take it as told by your health care provider. Do not stop taking the antibiotic even if you start to feel better. Eating and drinking  Drink enough fluid to keep your urine clear or pale yellow. It is recommended that you drink 3-4 quarts (2.8-3.8 L) a day. If you have been diagnosed with an infection, it is recommended that you drink cranberry juice in addition to large amounts of water.  Avoid caffeine, tea, and carbonated beverages. These tend to irritate the bladder.  Avoid alcohol because it may irritate the prostate (men). General instructions  If you have been diagnosed with a kidney stone, follow your health care provider's instructions about straining your urine to catch the stone.  Empty your bladder  often. Avoid holding urine for long periods of time.  If you are female: ? After a bowel movement, wipe from front to back and use each piece of toilet paper only once. ? Empty your bladder before and after sex.  Pay attention to any changes in your symptoms. Tell your health care provider about any changes or any new symptoms.  It is your responsibility to get your test results. Ask your health care provider, or the department performing the test, when your results will be ready.  Keep all follow-up visits as told by your health care provider. This is important. Contact a health care provider if:  You develop back pain.  You have a fever.  You have nausea or vomiting.  Your symptoms do not improve after 3 days.  Your symptoms get worse. Get help right away if:  You develop severe vomiting and are unable take medicine without vomiting.  You develop severe pain in your back or abdomen even though you are taking medicine.  You pass a large amount of blood in your urine.  You pass blood clots in your urine.  You feel very weak or like you might faint.  You faint. Summary  Hematuria is blood in the urine. It has many possible causes.  It is very important that you tell your health care provider about any blood in your urine, even if it is painless or the blood stops without treatment.  Take over-the-counter and prescription medicines only as told by your health care provider.  Drink enough fluid to keep   your urine clear or pale yellow. This information is not intended to replace advice given to you by your health care provider. Make sure you discuss any questions you have with your health care provider. Document Released: 06/11/2005 Document Revised: 05/24/2017 Document Reviewed: 07/14/2016 Elsevier Patient Education  2020 Elsevier Inc.  

## 2019-03-24 NOTE — Progress Notes (Signed)
Patient ID: Felicia Solis, female    DOB: [birth year]  Age: 53 y.o. MRN: [id number]    Subjective:  Subjective  HPI Felicia Solis presents for low abd pain and flank pain.   Pt has had [place] and labs done .  Pain is worsening ---- she has a hx of kidney stones   Review of Systems  Constitutional: Negative for appetite change, diaphoresis, fatigue and unexpected weight change.  Eyes: Negative for pain, redness and visual disturbance.  Respiratory: Negative for cough, chest tightness, shortness of breath and wheezing.   Cardiovascular: Negative for chest pain, palpitations and leg swelling.  Endocrine: Negative for cold intolerance, heat intolerance, polydipsia, polyphagia and polyuria.  Genitourinary: Positive for flank pain and frequency. Negative for difficulty urinating, dysuria, urgency, vaginal bleeding, vaginal discharge and vaginal pain.  Musculoskeletal: Positive for back pain.  Neurological: Negative for dizziness, light-headedness, numbness and headaches.    History Past Medical History:  Diagnosis Date  . Anemia    anemia  when pregnant in 2002  . Anxiety   . Arthritis    "knees and back" (09/28/2016)  . Asthma 5 yrs ago    told she had asthma.. does not remember doctors name.   . Chronic lower back pain   . Depression    takes citalopram  . History of hiatal hernia   . History of kidney stones   . Hyperlipidemia   . Hypothyroidism   . LBBB (left bundle branch block)    rate-related; identified on stress test 03/21/16  . Migraine    "weekly" (09/28/2016)  . 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 Shortness of breath    with exertion  . Thyroid disease   . Type II diabetes mellitus ([place]) dx'd 2016    She has a past surgical history that includes Endometrial ablation; Tubal ligation; laparoscopy; Dilation and curettage of uterus (1999); Thyroidectomy (09/28/2011); Thyroidectomy (09/28/2016); and Thyroidectomy (Left,  09/28/2016).   Her family history includes Breast cancer in her maternal aunt; Breast cancer (age of onset: 77) in her maternal grandmother; Colon cancer in her paternal grandfather; Colon polyps in her father; Diabetes in her brother, mother, and sister; Gallbladder disease in her maternal grandmother and mother; Heart attack in her brother; Heart disease in her mother and another family member; Hyperlipidemia in her father and mother; Hypertension in her father and mother; Ovarian cancer in her maternal grandmother.She reports that she has never smoked. She has never used smokeless tobacco. She reports that she does not drink alcohol or use drugs.  Current Outpatient Medications on File Prior to Visit  Medication Sig Dispense Refill  . acetaminophen (TYLENOL) 500 MG tablet Take 1,000 mg by mouth every 6 (six) hours as needed for mild pain.    Marland Kitchen ALPRAZolam (XANAX) 0.5 MG tablet Take 1 tablet (0.5 mg total) by mouth 3 (three) times daily as needed for anxiety. 10 tablet 0  . aspirin EC 81 MG tablet Take 1 tablet (81 mg total) by mouth daily. 90 tablet 3  . blood glucose meter kit and supplies KIT Dispense based on patient and insurance preference. Use up to four times daily as directed. (FOR ICD-9 250.00, 250.01). 1 each 0  . Cholecalciferol (VITAMIN D3) 2000 units TABS Take 2,000 Units by mouth daily.     . citalopram (CELEXA) 20 MG tablet Take 1 tablet (20 mg total) by mouth daily. 90 tablet 3  .  cyanocobalamin 500 MCG tablet Take 500 mcg by mouth daily.    . diazepam (VALIUM) 5 MG tablet Take as directed prior to procedure. 3 tablet 0  . Flaxseed, Linseed, (FLAXSEED OIL PO) Take 1,300 mg by mouth daily.    . fluticasone (FLONASE) 50 MCG/ACT nasal spray Place 2 sprays into both nostrils daily. 16 g 6  . fluticasone (FLONASE) 50 MCG/ACT nasal spray Place 2 sprays into both nostrils daily. 16 g 6  . gabapentin (NEURONTIN) 300 MG capsule Take 1 capsule (300 mg total) by mouth 3 (three) times daily. Can  start with 1 capsule at nighttime and increase to taking 1 capsule 3 times daily. 90 capsule 0  . Ginkgo Biloba Extract 60 MG CAPS Take 60 mg by mouth daily.    Marland Kitchen glimepiride (AMARYL) 2 MG tablet Take 1 tablet (2 mg total) by mouth daily before breakfast. 90 tablet 1  . glucose blood (ONETOUCH VERIO) test strip Onetouch Verio Check blood sugar twice daily 100 each 12  . hydrochlorothiazide (HYDRODIURIL) 25 MG tablet Take 1 tablet (25 mg total) by mouth daily. 30 tablet 2  . levocetirizine (XYZAL) 5 MG tablet Take 1 tablet (5 mg total) by mouth every evening. 30 tablet 5  . levothyroxine (SYNTHROID) 100 MCG tablet Take 1 tablet (100 mcg total) by mouth daily. 30 tablet 6  . losartan (COZAAR) 25 MG tablet Take 1 tablet by mouth once daily 90 tablet 1  . metFORMIN (GLUCOPHAGE XR) 500 MG 24 hr tablet Take 2 tablets by mouth daily 60 tablet 2  . Omega-3 Fatty Acids (FISH OIL) 1200 MG CAPS Take 1,200 mg by mouth daily.    . ondansetron (ZOFRAN) 4 MG tablet Take 1 tablet (4 mg total) by mouth every 8 (eight) hours as needed for nausea or vomiting. 20 tablet 0  . OneTouch Delica Lancets 32D MISC 1 Device by Does not apply route 2 (two) times daily as needed. 60 each 5  . pantoprazole (PROTONIX) 40 MG tablet Take 1 tablet (40 mg total) by mouth daily. 90 tablet 3  . rosuvastatin (CRESTOR) 20 MG tablet Take 1 tablet (20 mg total) by mouth daily. 90 tablet 1  . Semaglutide (RYBELSUS) 3 MG TABS Take 1 tablet by mouth daily. 30 tablet 2  . sitaGLIPtin-metformin (JANUMET) 50-1000 MG tablet Take 2 tablets by mouth daily with breakfast. 60 tablet 2   No current facility-administered medications on file prior to visit.      Objective:  Objective  Physical Exam Vitals signs and nursing note reviewed.  Constitutional:      Appearance: She is well-developed.  HENT:     Head: Normocephalic and atraumatic.  Eyes:     Conjunctiva/sclera: Conjunctivae normal.  Neck:     Musculoskeletal: Normal range of motion  and neck supple.     Thyroid: No thyromegaly.     Vascular: No carotid bruit or JVD.  Cardiovascular:     Rate and Rhythm: Normal rate and regular rhythm.     Heart sounds: Normal heart sounds. No murmur.  Pulmonary:     Effort: Pulmonary effort is normal. No respiratory distress.     Breath sounds: Normal breath sounds. No wheezing or rales.  Chest:     Chest wall: No tenderness.  Abdominal:     Tenderness: There is right CVA tenderness and left CVA tenderness. There is no guarding or rebound.  Neurological:     Mental Status: She is alert and oriented to person, place, and time.  BP 122/78 (BP Location: Right Arm, Patient Position: Sitting, Cuff Size: Normal)   Pulse 92   Temp (!) 97.1 F (36.2 C) (Temporal)   Resp 18   Ht 5' 6.5" (1.689 m)   Wt 221 lb 3.2 oz (100.3 kg)   SpO2 95%   BMI 35.17 kg/m  Wt Readings from Last 3 Encounters:  03/24/19 221 lb 3.2 oz (100.3 kg)  03/17/19 220 lb (99.8 kg)  02/24/19 220 lb (99.8 kg)     Lab Results  Component Value Date   WBC 8.2 03/24/2019   HGB 13.1 03/24/2019   HCT 40.2 03/24/2019   PLT 270.0 03/24/2019   GLUCOSE 108 (H) 03/24/2019   CHOL 135 02/23/2019   TRIG 166.0 (H) 02/23/2019   HDL 39.50 02/23/2019   LDLCALC 62 02/23/2019   ALT 17 03/24/2019   AST 15 03/24/2019   NA 143 03/24/2019   K 3.7 03/24/2019   CL 105 03/24/2019   CREATININE 0.49 03/24/2019   BUN 11 03/24/2019   CO2 29 03/24/2019   TSH 0.48 02/23/2019   HGBA1C 7.9 (H) 02/23/2019   MICROALBUR 2.5 (H) 04/04/2017    Mr Lumbar Spine W/o Contrast  Result Date: 03/15/2019 CLINICAL DATA:  Lower back pain radiating down bilateral legs EXAM: MRI LUMBAR SPINE WITHOUT CONTRAST TECHNIQUE: Multiplanar, multisequence MR imaging of the lumbar spine was performed. No intravenous contrast was administered. COMPARISON:  None. FINDINGS: Segmentation:  Standard. Alignment: 1 mm anterolisthesis of L3 on L4 secondary to facet disease. Vertebrae:  No fracture, evidence of  discitis, or bone lesion. Conus medullaris and cauda equina: Conus extends to the L1 level. Conus and cauda equina appear normal. Paraspinal and other soft tissues: No acute paraspinal abnormality. Disc levels: Disc spaces: Disc spaces are maintained. Mild disc desiccation at L3-4 and L5-S1. T12-L1: No significant disc bulge. No evidence of neural foraminal stenosis. No central canal stenosis. L1-L2: No significant disc bulge. No evidence of neural foraminal stenosis. No central canal stenosis. L2-L3: No significant disc bulge. No evidence of neural foraminal stenosis. No central canal stenosis. L3-L4: Mild broad-based disc bulge. Mild bilateral facet arthropathy. No evidence of neural foraminal stenosis. No central canal stenosis. L4-L5: No significant disc bulge. No evidence of neural foraminal stenosis. No central canal stenosis. L5-S1: No significant disc bulge. No evidence of neural foraminal stenosis. No central canal stenosis. IMPRESSION: 1. At L3-4 there is a mild broad-based disc bulge with mild bilateral facet arthropathy. No significant lumbar spine disc protrusion, foraminal stenosis or central canal stenosis. Electronically Signed   By: Kathreen Devoid   On: 03/15/2019 09:40     Assessment & Plan:  Plan  I am having Mariann Laster B. Sarkisyan start on HYDROcodone-acetaminophen. I am also having her maintain her acetaminophen, vitamin B-12, Ginkgo Biloba Extract, (Flaxseed, Linseed, (FLAXSEED OIL PO)), Fish Oil, Vitamin D3, aspirin EC, levothyroxine, blood glucose meter kit and supplies, fluticasone, ALPRAZolam, sitaGLIPtin-metformin, citalopram, rosuvastatin, pantoprazole, hydrochlorothiazide, gabapentin, losartan, glimepiride, fluticasone, levocetirizine, metFORMIN, Rybelsus, ondansetron, OneTouch Delica Lancets 14E, OneTouch Verio, and diazepam.  Meds ordered this encounter  Medications  . HYDROcodone-acetaminophen (NORCO) 5-325 MG tablet    Sig: Take 1 tablet by mouth every 6 (six) hours as needed for  moderate pain.    Dispense:  30 tablet    Refill:  0    Problem List Items Addressed This Visit      Unprioritized   Hematuria   Relevant Medications   HYDROcodone-acetaminophen ([place]) 5-325 MG tablet   Other Relevant Orders  CT RENAL STONE STUDY   Urine Culture    Other Visit Diagnoses    Pelvic pressure in female    -  Primary   Relevant Orders   POCT Urinalysis Dipstick (Automated) (Completed)   Urine Culture   Abdominal pain, unspecified abdominal location       Relevant Medications   HYDROcodone-acetaminophen ([place]) 5-325 MG tablet   Other Relevant Orders   CBC with Differential/Platelet (Completed)   Comprehensive metabolic panel (Completed)   Amylase (Completed)   Urine Culture   Pain of upper abdomen       Relevant Orders   CT RENAL STONE STUDY      Follow-up: Return if symptoms worsen or fail to improve.  [name], DO

## 2019-03-25 DIAGNOSIS — U071 COVID-19: Secondary | ICD-10-CM | POA: Diagnosis not present

## 2019-03-25 DIAGNOSIS — Z20828 Contact with and (suspected) exposure to other viral communicable diseases: Secondary | ICD-10-CM | POA: Diagnosis not present

## 2019-03-26 ENCOUNTER — Other Ambulatory Visit: Payer: Self-pay | Admitting: Urology

## 2019-03-26 ENCOUNTER — Other Ambulatory Visit: Payer: Self-pay

## 2019-03-26 DIAGNOSIS — R3121 Asymptomatic microscopic hematuria: Secondary | ICD-10-CM | POA: Diagnosis not present

## 2019-03-26 DIAGNOSIS — R8271 Bacteriuria: Secondary | ICD-10-CM | POA: Diagnosis not present

## 2019-03-26 DIAGNOSIS — N200 Calculus of kidney: Secondary | ICD-10-CM | POA: Diagnosis not present

## 2019-03-26 LAB — URINE CULTURE
MICRO NUMBER:: 934124
SPECIMEN QUALITY:: ADEQUATE

## 2019-03-27 ENCOUNTER — Encounter (HOSPITAL_COMMUNITY): Payer: Self-pay | Admitting: General Practice

## 2019-03-27 ENCOUNTER — Ambulatory Visit: Payer: BLUE CROSS/BLUE SHIELD | Admitting: Gynecology

## 2019-03-30 ENCOUNTER — Encounter: Payer: Self-pay | Admitting: Obstetrics & Gynecology

## 2019-03-30 ENCOUNTER — Other Ambulatory Visit: Payer: Self-pay

## 2019-03-30 ENCOUNTER — Ambulatory Visit (INDEPENDENT_AMBULATORY_CARE_PROVIDER_SITE_OTHER): Payer: BLUE CROSS/BLUE SHIELD | Admitting: Obstetrics & Gynecology

## 2019-03-30 ENCOUNTER — Ambulatory Visit: Payer: BLUE CROSS/BLUE SHIELD | Admitting: Gynecology

## 2019-03-30 VITALS — BP 131/83 | HR 72 | Ht 66.5 in | Wt 223.0 lb

## 2019-03-30 DIAGNOSIS — D219 Benign neoplasm of connective and other soft tissue, unspecified: Secondary | ICD-10-CM

## 2019-03-30 DIAGNOSIS — Z01419 Encounter for gynecological examination (general) (routine) without abnormal findings: Secondary | ICD-10-CM | POA: Diagnosis not present

## 2019-03-30 DIAGNOSIS — N393 Stress incontinence (female) (male): Secondary | ICD-10-CM

## 2019-03-30 DIAGNOSIS — N3941 Urge incontinence: Secondary | ICD-10-CM

## 2019-03-30 NOTE — Progress Notes (Signed)
Subjective:     Felicia Solis is a 53 y.o. female here for a routine exam.  LMP >10 years prev after endometrial ablation.  Current complaints: Pt reports that she has urge sx. She reports that if she does not get to the restroom in time, she will leak urine. This occurs ~2x/day. Pt also reports occ incontinence with coughing. This is not common. Pt does not smoke. She does drink caffeinated products but, reports that this is less than prev. She still drinks caffeinated soda and teas.   Pt had a CT for reasons that she cannot remember and was noted to have fibroids. Pt is s/p endometrial ablation prev for AUB. She believes that she was told that she had fibroids at that time. She denies PMPB.     Gynecologic History No LMP recorded. Patient has had an ablation. Contraception: post menopausal status and tubal ligation Last Pap:09/11/2017 . Results were: normal Last mammogram: 09/04/2018. Results were: normal  Obstetric History OB History  Gravida Para Term Preterm AB Living  5 3 3   2 3   SAB TAB Ectopic Multiple Live Births  2            # Outcome Date GA Lbr Len/2nd Weight Sex Delivery Anes PTL Lv  5 SAB           4 SAB           3 Term           2 Term           1 Term            The following portions of the patient's history were reviewed and updated as appropriate: allergies, current medications, past family history, past medical history, past social history, past surgical history and problem list.  Review of Systems Pertinent items are noted in HPI.    Objective:  BP 131/83   Pulse 72   Ht 5' 6.5" (1.689 m)   Wt 223 lb (101.2 kg)   BMI 35.45 kg/m  General Appearance:    Alert, cooperative, no distress, appears stated age  Head:    Normocephalic, without obvious abnormality, atraumatic  Eyes:    conjunctiva/corneas clear, EOM's intact, both eyes  Ears:    Normal external ear canals, both ears  Nose:   Nares normal, septum midline, mucosa normal, no drainage    or sinus  tenderness  Throat:   Lips, mucosa, and tongue normal; teeth and gums normal  Neck:   Supple, symmetrical, trachea midline, no adenopathy;    thyroid:  no enlargement/tenderness/nodules  Back:     Symmetric, no curvature, ROM normal, no CVA tenderness  Lungs:     respirations unlabored  Chest Wall:    No tenderness or deformity   Heart:    Regular rate and rhythm  Breast Exam:    No tenderness, masses, or nipple abnormality; bilaterally inverted nipples.   Abdomen:     Soft, non-tender, bowel sounds active all four quadrants,    no masses, no organomegaly  Genitalia:    Normal female without lesion, discharge or tenderness   Uterus small; mobile.    Extremities:   Extremities normal, atraumatic, no cyanosis or edema  Pulses:   2+ and symmetric all extremities  Skin:   Skin color, texture, turgor normal, no rashes or lesions   10/24/2018 CLINICAL DATA:  Abdominal pain, palpable epigastric mass  EXAM: CT ABDOMEN AND PELVIS WITH CONTRAST  TECHNIQUE: Multidetector CT imaging  of the abdomen and pelvis was performed using the standard protocol following bolus administration of intravenous contrast.  CONTRAST:  167mL OMNIPAQUE IOHEXOL 300 MG/ML SOLN, additional oral enteric contrast  COMPARISON:  08/05/2017  FINDINGS: Lower chest: No acute abnormality.  Hepatobiliary: No focal liver abnormality is seen. No gallstones, gallbladder wall thickening, or biliary dilatation.  Pancreas: Unremarkable. No pancreatic ductal dilatation or surrounding inflammatory changes.  Spleen: Normal in size without focal abnormality.  Adrenals/Urinary Tract: Adrenal glands are unremarkable. Nonobstructive right-sided renal calculi including a 1.3 cm calculus in the right renal pelvis (series 5, image 79). Bladder is unremarkable.  Stomach/Bowel: Stomach is within normal limits. Appendix appears normal. No evidence of bowel wall thickening, distention, or inflammatory changes. Occasional  sigmoid diverticula. Moderate burden of stool in the colon.  Vascular/Lymphatic: No significant vascular findings are present. No enlarged abdominal or pelvic lymph nodes.  Reproductive: Fibroid uterus.  Other: No abdominal wall hernia or abnormality. No abdominopelvic ascites.  Musculoskeletal: No acute or significant osseous findings. There is an upturned xiphoid process (series 6, image 64).  IMPRESSION: 1. No acute CT findings of the abdomen or pelvis to explain epigastric abdominal pain.  2. There is an upturned xiphoid process (series 6, image 64), a normal anatomic variant which may explain palpable abnormality.  3. Nonobstructive right-sided renal calculi including a 1.3 cm calculus in the right renal pelvis (series 5, image 79). This calculus has migrated to the renal pelvis from the inferior pole when compared to prior examination dated 2019.  4.  Other chronic and incidental findings as detailed above. Assessment:    Healthy female exam.   Mixed incontinence- stress and urge  D/w pt caffeine use and its relationship to urge sx. She would like to try to stop the caffeine prior to starting meds. Would like to consider PT for stress sx.    Fibroids_ asymptomatic Plan:    Follow up in: 6 weeks.    Referral to PT for pelvic exercises to manage SUI D/c all caffeine.   Lief Palmatier L. Harraway-Smith, M.D., Cherlynn June

## 2019-03-30 NOTE — Patient Instructions (Addendum)
Kegel Exercises  Kegel exercises can help strengthen your pelvic floor muscles. The pelvic floor is a group of muscles that support your rectum, small intestine, and bladder. In females, pelvic floor muscles also help support the womb (uterus). These muscles help you control the flow of urine and stool. Kegel exercises are painless and simple, and they do not require any equipment. Your provider may suggest Kegel exercises to:  Improve bladder and bowel control.  Improve sexual response.  Improve weak pelvic floor muscles after surgery to remove the uterus (hysterectomy) or pregnancy (females).  Improve weak pelvic floor muscles after prostate gland removal or surgery (males). Kegel exercises involve squeezing your pelvic floor muscles, which are the same muscles you squeeze when you try to stop the flow of urine or keep from passing gas. The exercises can be done while sitting, standing, or lying down, but it is best to vary your position. Exercises How to do Kegel exercises: 1. Squeeze your pelvic floor muscles tight. You should feel a tight lift in your rectal area. If you are a female, you should also feel a tightness in your vaginal area. Keep your stomach, buttocks, and legs relaxed. 2. Hold the muscles tight for up to 10 seconds. 3. Breathe normally. 4. Relax your muscles. 5. Repeat as told by your health care provider. Repeat this exercise daily as told by your health care provider. Continue to do this exercise for at least 4-6 weeks, or for as long as told by your health care provider. You may be referred to a physical therapist who can help you learn more about how to do Kegel exercises. Depending on your condition, your health care provider may recommend:  Varying how long you squeeze your muscles.  Doing several sets of exercises every day.  Doing exercises for several weeks.  Making Kegel exercises a part of your regular exercise routine. This information is not intended  to replace advice given to you by your health care provider. Make sure you discuss any questions you have with your health care provider. Document Released: 05/28/2012 Document Revised: 01/29/2018 Document Reviewed: 01/29/2018 Elsevier Patient Education  [name] Urinary Incontinence  Urinary incontinence refers to a condition in which a person is unable to control where and when to pass urine. A person with this condition will urinate when he or she does not mean to (involuntarily). What are the causes? This condition may be caused by:  Medicines.  Infections.  Constipation.  Overactive bladder muscles.  Weak bladder muscles.  Weak pelvic floor muscles. These muscles provide support for the bladder, intestine, and, in women, the uterus.  Enlarged prostate in men. The prostate is a gland near the bladder. When it gets too big, it can pinch the urethra. With the urethra blocked, the bladder can weaken and lose the ability to empty properly.  Surgery.  Emotional factors, such as anxiety, stress, or post-traumatic stress disorder (PTSD).  Pelvic organ prolapse. This happens in women when organs shift out of place and into the vagina. This shift can prevent the bladder and urethra from working properly. What increases the risk? The following factors may make you more likely to develop this condition:  Older age.  Obesity and physical inactivity.  Pregnancy and childbirth.  Menopause.  Diseases that affect the nerves or spinal cord (neurological diseases).  Long-term (chronic) coughing. This can increase pressure on the bladder and pelvic floor muscles. What are the signs or symptoms? Symptoms may vary depending on the type  of urinary incontinence you have. They include:  A sudden urge to urinate, but passing urine involuntarily before you can get to a bathroom (urge incontinence).  Suddenly passing urine with any activity that forces urine to pass, such as  coughing, laughing, exercise, or sneezing (stress incontinence).  Needing to urinate often, but urinating only a small amount, or constantly dribbling urine (overflow incontinence).  Urinating because you cannot get to the bathroom in time due to a physical disability, such as arthritis or injury, or communication and thinking problems, such as Alzheimer disease (functional incontinence). How is this diagnosed? This condition may be diagnosed based on:  Your medical history.  A physical exam.  Tests, such as: ? Urine tests. ? X-rays of your kidney and bladder. ? Ultrasound. ? CT scan. ? Cystoscopy. In this procedure, a health care provider inserts a tube with a light and camera (cystoscope) through the urethra and into the bladder in order to check for problems. ? Urodynamic testing. These tests assess how well the bladder, urethra, and sphincter can store and release urine. There are different types of urodynamic tests, and they vary depending on what the test is measuring. To help diagnose your condition, your health care provider may recommend that you keep a log of when you urinate and how much you urinate. How is this treated? Treatment for this condition depends on the type of incontinence that you have and its cause. Treatment may include:  Lifestyle changes, such as: ? Quitting smoking. ? Maintaining a healthy weight. ? Staying active. Try to get 150 minutes of moderate-intensity exercise every week. Ask your health care provider which activities are safe for you. ? Eating a healthy diet.  Avoid high-fat foods, like fried foods.  Avoid refined carbohydrates like white bread and white rice.  Limit how much alcohol and caffeine you drink.  Increase your fiber intake. Foods such as fresh fruits, vegetables, beans, and whole grains are healthy sources of fiber.  Pelvic floor muscle exercises.  Bladder training, such as lengthening the amount of time between bathroom breaks,  or using the bathroom at regular intervals.  Using techniques to suppress bladder urges. This can include distraction techniques or controlled breathing exercises.  Medicines to relax the bladder muscles and prevent bladder spasms.  Medicines to help slow or prevent the growth of a man's prostate.  Botox injections. These can help relax the bladder muscles.  Using pulses of electricity to help change bladder reflexes (electrical nerve stimulation).  For women, using a medical device to prevent urine leaks. This is a small, tampon-like, disposable device that is inserted into the urethra.  Injecting collagen or carbon beads (bulking agents) into the urinary sphincter. These can help thicken tissue and close the bladder opening.  Surgery. Follow these instructions at home: Lifestyle  Limit alcohol and caffeine. These can fill your bladder quickly and irritate it.  Keep yourself clean to help prevent odors and skin damage. Ask your doctor about special skin creams and cleansers that can protect the skin from urine.  Consider wearing pads or adult diapers. Make sure to change them regularly, and always change them right after experiencing incontinence. General instructions  Take over-the-counter and prescription medicines only as told by your health care provider.  Use the bathroom about every 3-4 hours, even if you do not feel the need to urinate. Try to empty your bladder completely every time. After urinating, wait a minute. Then try to urinate again.  Make sure you are in  a relaxed position while urinating.  If your incontinence is caused by nerve problems, keep a log of the medicines you take and the times you go to the bathroom.  Keep all follow-up visits as told by your health care provider. This is important. Contact a health care provider if:  You have pain that gets worse.  Your incontinence gets worse. Get help right away if:  You have a fever or chills.  You are  unable to urinate.  You have redness in your groin area or down your legs. Summary  Urinary incontinence refers to a condition in which a person is unable to control where and when to pass urine.  This condition may be caused by medicines, infection, weak bladder muscles, weak pelvic floor muscles, enlargement of the prostate (in men), or surgery.  The following factors increase your risk for developing this condition: older age, obesity, pregnancy and childbirth, menopause, neurological diseases, and chronic coughing.  There are several types of urinary incontinence. They include urge incontinence, stress incontinence, overflow incontinence, and functional incontinence.  This condition is usually treated first with lifestyle and behavioral changes, such as quitting smoking, eating a healthier diet, and doing regular pelvic floor exercises. Other treatment options include medicines, bulking agents, medical devices, electrical nerve stimulation, or surgery. This information is not intended to replace advice given to you by your health care provider. Make sure you discuss any questions you have with your health care provider. Document Released: 07/19/2004 Document Revised: 06/21/2017 Document Reviewed: 09/20/2016 Elsevier Patient Education  2020 [employer].

## 2019-04-01 ENCOUNTER — Telehealth: Payer: Self-pay

## 2019-04-01 NOTE — Telephone Encounter (Signed)
Letter created. Left VM for patient to pick letter up at the front desk.

## 2019-04-01 NOTE — Telephone Encounter (Signed)
Copied from [place] [id number]. Topic: General - Inquiry >> Mar 31, 2019  1:19 PM Mathis Bud wrote: Reason for CRM: Patient is requesting a out of work letter for patient. 10/7-10/9 for her kidney stones.  Patient is having her kidney stone procedure done on Friday.  She is in a lot of pain and is requesting if PCP can provide this for her.  Patient would like to come pick up letter. If approved patient would like a call back when it is ready for her to pick up Pt call back [phone]

## 2019-04-02 ENCOUNTER — Ambulatory Visit (HOSPITAL_COMMUNITY): Payer: BLUE CROSS/BLUE SHIELD

## 2019-04-02 ENCOUNTER — Encounter (HOSPITAL_COMMUNITY)
Admission: RE | Disposition: A | Discharge status: Home / Self Care | Payer: Self-pay | Source: Other Acute Inpatient Hospital | Attending: Urology

## 2019-04-02 ENCOUNTER — Emergency Department (HOSPITAL_COMMUNITY)
Admission: EM | Admit: 2019-04-02 | Discharge: 2019-04-02 | Disposition: A | Discharge status: Home / Self Care | Payer: BLUE CROSS/BLUE SHIELD | Source: Home / Self Care

## 2019-04-02 ENCOUNTER — Encounter (HOSPITAL_COMMUNITY): Payer: Self-pay | Admitting: *Deleted

## 2019-04-02 ENCOUNTER — Other Ambulatory Visit: Payer: Self-pay

## 2019-04-02 ENCOUNTER — Ambulatory Visit (HOSPITAL_COMMUNITY)
Admission: RE | Admit: 2019-04-02 | Discharge: 2019-04-02 | Disposition: A | Discharge status: Home / Self Care | Payer: BLUE CROSS/BLUE SHIELD | Source: Other Acute Inpatient Hospital | Attending: Urology | Admitting: Urology

## 2019-04-02 DIAGNOSIS — M479 Spondylosis, unspecified: Secondary | ICD-10-CM | POA: Insufficient documentation

## 2019-04-02 DIAGNOSIS — G43909 Migraine, unspecified, not intractable, without status migrainosus: Secondary | ICD-10-CM | POA: Diagnosis not present

## 2019-04-02 DIAGNOSIS — E039 Hypothyroidism, unspecified: Secondary | ICD-10-CM | POA: Insufficient documentation

## 2019-04-02 DIAGNOSIS — R109 Unspecified abdominal pain: Secondary | ICD-10-CM | POA: Insufficient documentation

## 2019-04-02 DIAGNOSIS — N200 Calculus of kidney: Secondary | ICD-10-CM

## 2019-04-02 DIAGNOSIS — F329 Major depressive disorder, single episode, unspecified: Secondary | ICD-10-CM | POA: Insufficient documentation

## 2019-04-02 DIAGNOSIS — F419 Anxiety disorder, unspecified: Secondary | ICD-10-CM | POA: Insufficient documentation

## 2019-04-02 DIAGNOSIS — M170 Bilateral primary osteoarthritis of knee: Secondary | ICD-10-CM | POA: Insufficient documentation

## 2019-04-02 DIAGNOSIS — R0902 Hypoxemia: Secondary | ICD-10-CM | POA: Diagnosis not present

## 2019-04-02 DIAGNOSIS — Z01818 Encounter for other preprocedural examination: Secondary | ICD-10-CM | POA: Diagnosis not present

## 2019-04-02 DIAGNOSIS — E890 Postprocedural hypothyroidism: Secondary | ICD-10-CM | POA: Insufficient documentation

## 2019-04-02 DIAGNOSIS — I10 Essential (primary) hypertension: Secondary | ICD-10-CM | POA: Diagnosis not present

## 2019-04-02 DIAGNOSIS — E785 Hyperlipidemia, unspecified: Secondary | ICD-10-CM | POA: Insufficient documentation

## 2019-04-02 DIAGNOSIS — Z87442 Personal history of urinary calculi: Secondary | ICD-10-CM | POA: Diagnosis not present

## 2019-04-02 DIAGNOSIS — Z5321 Procedure and treatment not carried out due to patient leaving prior to being seen by health care provider: Secondary | ICD-10-CM | POA: Insufficient documentation

## 2019-04-02 DIAGNOSIS — E119 Type 2 diabetes mellitus without complications: Secondary | ICD-10-CM | POA: Insufficient documentation

## 2019-04-02 DIAGNOSIS — I447 Left bundle-branch block, unspecified: Secondary | ICD-10-CM | POA: Diagnosis not present

## 2019-04-02 DIAGNOSIS — R52 Pain, unspecified: Secondary | ICD-10-CM | POA: Diagnosis not present

## 2019-04-02 HISTORY — PX: EXTRACORPOREAL SHOCK WAVE LITHOTRIPSY: SHX1557

## 2019-04-02 LAB — CBC
HCT: 38.3 % (ref 36.0–46.0)
Hemoglobin: 12.6 g/dL (ref 12.0–15.0)
MCH: 28.7 pg (ref 26.0–34.0)
MCHC: 32.9 g/dL (ref 30.0–36.0)
MCV: 87.2 fL (ref 80.0–100.0)
Platelets: 260 10*3/uL (ref 150–400)
RBC: 4.39 MIL/uL (ref 3.87–5.11)
RDW: 13.2 % (ref 11.5–15.5)
WBC: 15.2 10*3/uL — ABNORMAL HIGH (ref 4.0–10.5)
nRBC: 0 % (ref 0.0–0.2)

## 2019-04-02 LAB — COMPREHENSIVE METABOLIC PANEL
ALT: 26 U/L (ref 0–44)
AST: 29 U/L (ref 15–41)
Albumin: 4.4 g/dL (ref 3.5–5.0)
Alkaline Phosphatase: 44 U/L (ref 38–126)
Anion gap: 11 (ref 5–15)
BUN: 8 mg/dL (ref 6–20)
CO2: 26 mmol/L (ref 22–32)
Calcium: 8.7 mg/dL — ABNORMAL LOW (ref 8.9–10.3)
Chloride: 104 mmol/L (ref 98–111)
Creatinine, Ser: 0.55 mg/dL (ref 0.44–1.00)
GFR calc Af Amer: >60 mL/min (ref >60)
GFR calc non Af Amer: >60 mL/min (ref >60)
Glucose, Bld: 167 mg/dL — ABNORMAL HIGH (ref 70–99)
Potassium: 3.4 mmol/L — ABNORMAL LOW (ref 3.5–5.1)
Sodium: 141 mmol/L (ref 135–145)
Total Bilirubin: 0.9 mg/dL (ref 0.3–1.2)
Total Protein: 7.2 g/dL (ref 6.5–8.1)

## 2019-04-02 LAB — I-STAT BETA HCG BLOOD, ED (MC, WL, AP ONLY): I-stat hCG, quantitative: <5 m[IU]/mL (ref <5)

## 2019-04-02 LAB — LIPASE, BLOOD: Lipase: 32 U/L (ref 11–51)

## 2019-04-02 LAB — GLUCOSE, CAPILLARY: Glucose-Capillary: 125 mg/dL — ABNORMAL HIGH (ref 70–99)

## 2019-04-02 SURGERY — LITHOTRIPSY, ESWL
Anesthesia: LOCAL | Laterality: Right

## 2019-04-02 MED ORDER — DIAZEPAM 5 MG PO TABS
10.0000 mg | ORAL_TABLET | ORAL | Status: AC
  Administered 2019-04-02: 10 mg via ORAL
  Filled 2019-04-02: qty 2

## 2019-04-02 MED ORDER — DIPHENHYDRAMINE HCL 25 MG PO CAPS
25.0000 mg | ORAL_CAPSULE | ORAL | Status: AC
  Administered 2019-04-02: 25 mg via ORAL
  Filled 2019-04-02: qty 1

## 2019-04-02 MED ORDER — SODIUM CHLORIDE 0.9 % IV SOLN
INTRAVENOUS | Status: DC
  Administered 2019-04-02: 07:00:00 via INTRAVENOUS

## 2019-04-02 MED ORDER — SODIUM CHLORIDE 0.9% FLUSH: 3.0000 mL | Freq: Once | INTRAVENOUS | Status: DC

## 2019-04-02 MED ORDER — CIPROFLOXACIN HCL 500 MG PO TABS
500.0000 mg | ORAL_TABLET | ORAL | Status: AC
  Administered 2019-04-02: 500 mg via ORAL
  Filled 2019-04-02: qty 1

## 2019-04-02 MED ORDER — TAMSULOSIN HCL 0.4 MG PO CAPS: 0.4000 mg | ORAL_CAPSULE | Freq: Every day | ORAL | 0 refills | Status: DC

## 2019-04-02 NOTE — ED Notes (Signed)
Called pt x2 for vitals, no response. °

## 2019-04-02 NOTE — Op Note (Signed)
[facility] Operative Note  Treating Physician: Ellison Hughs, MD  Pre-op diagnosis: 14 mm right renal stone  Post-op diagnosis: Same   Procedure: RIGHT ESWL  See Aris Everts OP note scanned into chart. Also because of the size, density, location and other factors that cannot be anticipated I feel this will likely be a staged procedure. This fact supersedes any indication in the scanned [place] stone operative note to the contrary

## 2019-04-02 NOTE — ED Triage Notes (Addendum)
Pt arrived by [facility] complaining of abdominal pain. Pt stated she was treated at [facility] this morning for kidney stones and has a lithotripsy. Pt called EMS 2 hours after she arrived home for continuing abdominal pain, she didn't pick up prescribed pain medication. Pt received 100 mcg fentanyl by EMS

## 2019-04-02 NOTE — ED Notes (Signed)
Pt called x2 for vitals recheck  

## 2019-04-02 NOTE — Discharge Instructions (Signed)
Dietary Guidelines to Help Prevent Kidney Stones Kidney stones are deposits of minerals and salts that form inside your kidneys. Your risk of developing kidney stones may be greater depending on your diet, your lifestyle, the medicines you take, and whether you have certain medical conditions. Most people can reduce their chances of developing kidney stones by following the instructions below. Depending on your overall health and the type of kidney stones you tend to develop, your dietitian may give you more specific instructions. What are tips for following this plan? Reading food labels  Choose foods with "no salt added" or "low-salt" labels. Limit your sodium intake to less than 1500 mg per day.  Choose foods with calcium for each meal and snack. Try to eat about 300 mg of calcium at each meal. Foods that contain 200-500 mg of calcium per serving include: ? 8 oz (237 ml) of milk, fortified nondairy milk, and fortified fruit juice. ? 8 oz (237 ml) of kefir, yogurt, and soy yogurt. ? 4 oz (118 ml) of tofu. ? 1 oz of cheese. ? 1 cup (300 g) of dried figs. ? 1 cup (91 g) of cooked broccoli. ? 1-3 oz can of sardines or mackerel.  Most people need 1000 to 1500 mg of calcium each day. Talk to your dietitian about how much calcium is recommended for you. Shopping  Buy plenty of fresh fruits and vegetables. Most people do not need to avoid fruits and vegetables, even if they contain nutrients that may contribute to kidney stones.  When shopping for convenience foods, choose: ? Whole pieces of fruit. ? Premade salads with dressing on the side. ? Low-fat fruit and yogurt smoothies.  Avoid buying frozen meals or prepared deli foods.  Look for foods with live cultures, such as yogurt and kefir. Cooking  Do not add salt to food when cooking. Place a salt shaker on the table and allow each person to add his or her own salt to taste.  Use vegetable protein, such as beans, textured vegetable  protein (TVP), or tofu instead of meat in pasta, casseroles, and soups. Meal planning   Eat less salt, if told by your dietitian. To do this: ? Avoid eating processed or premade food. ? Avoid eating fast food.  Eat less animal protein, including cheese, meat, poultry, or fish, if told by your dietitian. To do this: ? Limit the number of times you have meat, poultry, fish, or cheese each week. Eat a diet free of meat at least 2 days a week. ? Eat only one serving each day of meat, poultry, fish, or seafood. ? When you prepare animal protein, cut pieces into small portion sizes. For most meat and fish, one serving is about the size of one deck of cards.  Eat at least 5 servings of fresh fruits and vegetables each day. To do this: ? Keep fruits and vegetables on hand for snacks. ? Eat 1 piece of fruit or a handful of berries with breakfast. ? Have a salad and fruit at lunch. ? Have two kinds of vegetables at dinner.  Limit foods that are high in a substance called oxalate. These include: ? Spinach. ? Rhubarb. ? Beets. ? Potato chips and french fries. ? Nuts.  If you regularly take a diuretic medicine, make sure to eat at least 1-2 fruits or vegetables high in potassium each day. These include: ? Avocado. ? Banana. ? Orange, prune, carrot, or tomato juice. ? Baked potato. ? Cabbage. ? Beans and split   peas. General instructions   Drink enough fluid to keep your urine clear or pale yellow. This is the most important thing you can do.  Talk to your health care provider and dietitian about taking daily supplements. Depending on your health and the cause of your kidney stones, you may be advised: ? Not to take supplements with vitamin C. ? To take a calcium supplement. ? To take a daily probiotic supplement. ? To take other supplements such as magnesium, fish oil, or vitamin B6.  Take all medicines and supplements as told by your health care provider.  Limit alcohol intake to no  more than 1 drink a day for nonpregnant women and 2 drinks a day for men. One drink equals 12 oz of beer, 5 oz of wine, or 1 oz of hard liquor.  Lose weight if told by your health care provider. Work with your dietitian to find strategies and an eating plan that works best for you. What foods are not recommended? Limit your intake of the following foods, or as told by your dietitian. Talk to your dietitian about specific foods you should avoid based on the type of kidney stones and your overall health. Grains Breads. Bagels. Rolls. Baked goods. Salted crackers. Cereal. Pasta. Vegetables Spinach. Rhubarb. Beets. Canned vegetables. Pickles. Olives. Meats and other protein foods Nuts. Nut butters. Large portions of meat, poultry, or fish. Salted or cured meats. Deli meats. Hot dogs. Sausages. Dairy Cheese. Beverages Regular soft drinks. Regular vegetable juice. Seasonings and other foods Seasoning blends with salt. Salad dressings. Canned soups. Soy sauce. Ketchup. Barbecue sauce. Canned pasta sauce. Casseroles. Pizza. Lasagna. Frozen meals. Potato chips. French fries. Summary  You can reduce your risk of kidney stones by making changes to your diet.  The most important thing you can do is drink enough fluid. You should drink enough fluid to keep your urine clear or pale yellow.  Ask your health care provider or dietitian how much protein from animal sources you should eat each day, and also how much salt and calcium you should have each day. This information is not intended to replace advice given to you by your health care provider. Make sure you discuss any questions you have with your health care provider. Document Released: 10/06/2010 Document Revised: 10/01/2018 Document Reviewed: 05/22/2016 Elsevier Patient Education  2020 [employer]  

## 2019-04-02 NOTE — H&P (Signed)
Urology Preoperative H&P   Chief Complaint: right flank pain  History of Present Illness: Felicia Solis is a 53 y.o. female established patient who is here for renal calculi.  The problem is on the right side. She first stated noticing pain on 03/24/2019. This is not her first kidney stone. She has had 1 stones prior to getting this one.   She developed right flank and RLQ pain. She had frequency. CT 03/24/2019 revealed a 14 mm left renal pelvic stone (visible?, ssd 9.5 cm, HU 900 or less). No stone passage. No gross hematuria.   She passed a stone in 2013.   Past Medical History:  Diagnosis Date  . Anemia    anemia  when pregnant in 2002  . Anxiety   . Arthritis    "knees and back" (09/28/2016)  . Asthma 5 yrs ago    told she had asthma.. does not remember doctors name.   . Chronic lower back pain   . Depression    takes citalopram  . History of hiatal hernia   . History of kidney stones   . Hyperlipidemia   . Hypothyroidism   . LBBB (left bundle branch block)    rate-related; identified on stress test 03/21/16  . Migraine    "weekly" (09/28/2016)  . 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 Shortness of breath    with exertion  . Thyroid disease   . Type II diabetes mellitus ([place]) dx'd 2016    Past Surgical History:  Procedure Laterality Date  . DILATION AND CURETTAGE OF UTERUS  1999  . ENDOMETRIAL ABLATION    . LAPAROSCOPY     years ago ..pt does not know where surgery was done ... states" my stomach would blow up then go down"  . THYROIDECTOMY  09/28/2011   Procedure: THYROIDECTOMY;  Surgeon: Izora Gala, MD;  Location: [facility];  Service: ENT;  Laterality: Right;  RIGHT THYROIDECTOMY WITH FROZEN SECTION  . THYROIDECTOMY  09/28/2016   completion of thyroidectomy/notes 09/28/2016  . THYROIDECTOMY Left 09/28/2016   Procedure: COMPLETION OF THYROIDECTOMY;  Surgeon: Izora Gala, MD;  Location: [facility];  Service: ENT;  Laterality:  Left;  . TUBAL LIGATION      Allergies:  Allergies  Allergen Reactions  . Lisinopril Cough    Family History  Problem Relation Age of Onset  . Diabetes Mother   . Hypertension Mother   . Hyperlipidemia Mother   . Gallbladder disease Mother   . Heart disease Mother   . Diabetes Sister   . Hypertension Father   . Hyperlipidemia Father   . Colon polyps Father   . Ovarian cancer Maternal Grandmother   . Gallbladder disease Maternal Grandmother   . Breast cancer Maternal Grandmother 32  . Colon cancer Paternal Grandfather   . Breast cancer Maternal Aunt        late 38s  . Heart disease Other        early cad  . Diabetes Brother   . Heart attack Brother   . Anesthesia problems Neg Hx   . Hypotension Neg Hx   . Malignant hyperthermia Neg Hx   . Pseudochol deficiency Neg Hx   . Esophageal cancer Neg Hx     Social History:  reports that she has never smoked. She has never used smokeless tobacco. She reports that she does not drink alcohol or use drugs.  ROS: A complete review  of systems was performed.  All systems are negative except for pertinent findings as noted.  Physical Exam:  Vital signs in last 24 hours: Temp:  [98.8 F (37.1 C)] 98.8 F (37.1 C) (10/08 0621) Pulse Rate:  [79] 79 (10/08 0621) Resp:  [17] 17 (10/08 0621) BP: (135)/(93) 135/93 (10/08 0621) SpO2:  [100 %] 100 % (10/08 0621) Weight:  [100.2 kg] 100.2 kg (10/08 [id number]) Constitutional:  Alert and oriented, No acute distress Cardiovascular: Regular rate and rhythm, No JVD Respiratory: Normal respiratory effort, Lungs clear bilaterally GI: Abdomen is soft, nontender, nondistended, no abdominal masses GU: No CVA tenderness Lymphatic: No lymphadenopathy Neurologic: Grossly intact, no focal deficits Psychiatric: Normal mood and affect  Laboratory Data:  No results for input(s): WBC, HGB, HCT, PLT in the last 72 hours.  No results for input(s): NA, K, CL, GLUCOSE, BUN, CALCIUM, CREATININE in the last  72 hours.  Invalid input(s): CO3   Results for orders placed or performed during the hospital encounter of 04/02/19 (from the past 24 hour(s))  Glucose, capillary     Status: Abnormal   Collection Time: 04/02/19  6:23 AM  Result Value Ref Range   Glucose-Capillary 125 (H) 70 - 99 mg/dL   Recent Results (from the past 240 hour(s))  Urine Culture     Status: None   Collection Time: 03/24/19  3:25 PM   Specimen: Blood  Result Value Ref Range Status   MICRO NUMBER: [id number]  Final   SPECIMEN QUALITY: Adequate  Final   Sample Source URINE  Final   STATUS: FINAL  Final   ISOLATE 1:   Final    Growth of mixed flora was isolated, suggesting probable contamination. No further testing will be performed. If clinically indicated, recollection using a method to minimize contamination, with prompt transfer to Urine Culture Transport Tube, is  recommended.     Renal Function: No results for input(s): CREATININE in the last 168 hours. Estimated Creatinine Clearance: 96.1 mL/min (by C-G formula based on SCr of 0.49 mg/dL).  Radiologic Imaging: No results found.  I independently reviewed the above imaging studies.  Assessment and Plan Felicia Solis is a 53 y.o. female with a 14 mm right renal stone associated with right flank pain  The risks, benefits and alternatives of RIGHT ESWL was discussed with the patient. I described the risks which include arrhythmia, kidney contusion, kidney hemorrhage, need for transfusion, back discomfort, flank ecchymosis, flank abrasion, inability to fracture the stone, inability to pass stone fragments, Steinstrasse, infection associated with obstructing stones, need for an alternative surgical procedure and possible need for repeat shockwave lithotripsy.  The patient voices understanding and wishes to proceed.    Ellison Hughs, MD 04/02/2019, 7:36 AM  [facility] Pager: [phone]

## 2019-04-03 ENCOUNTER — Encounter (HOSPITAL_BASED_OUTPATIENT_CLINIC_OR_DEPARTMENT_OTHER): Payer: Self-pay | Admitting: Emergency Medicine

## 2019-04-03 ENCOUNTER — Emergency Department (HOSPITAL_BASED_OUTPATIENT_CLINIC_OR_DEPARTMENT_OTHER): Payer: BLUE CROSS/BLUE SHIELD

## 2019-04-03 ENCOUNTER — Emergency Department (HOSPITAL_BASED_OUTPATIENT_CLINIC_OR_DEPARTMENT_OTHER)
Admission: EM | Admit: 2019-04-03 | Discharge: 2019-04-03 | Disposition: A | Discharge status: Home / Self Care | Payer: BLUE CROSS/BLUE SHIELD | Attending: Emergency Medicine | Admitting: Emergency Medicine

## 2019-04-03 ENCOUNTER — Other Ambulatory Visit: Payer: Self-pay

## 2019-04-03 DIAGNOSIS — I447 Left bundle-branch block, unspecified: Secondary | ICD-10-CM | POA: Insufficient documentation

## 2019-04-03 DIAGNOSIS — Z79899 Other long term (current) drug therapy: Secondary | ICD-10-CM | POA: Diagnosis not present

## 2019-04-03 DIAGNOSIS — R112 Nausea with vomiting, unspecified: Secondary | ICD-10-CM

## 2019-04-03 DIAGNOSIS — E1169 Type 2 diabetes mellitus with other specified complication: Secondary | ICD-10-CM | POA: Insufficient documentation

## 2019-04-03 DIAGNOSIS — I10 Essential (primary) hypertension: Secondary | ICD-10-CM | POA: Insufficient documentation

## 2019-04-03 DIAGNOSIS — J45909 Unspecified asthma, uncomplicated: Secondary | ICD-10-CM | POA: Insufficient documentation

## 2019-04-03 DIAGNOSIS — R1030 Lower abdominal pain, unspecified: Secondary | ICD-10-CM | POA: Diagnosis not present

## 2019-04-03 DIAGNOSIS — Z7984 Long term (current) use of oral hypoglycemic drugs: Secondary | ICD-10-CM | POA: Insufficient documentation

## 2019-04-03 DIAGNOSIS — E785 Hyperlipidemia, unspecified: Secondary | ICD-10-CM | POA: Diagnosis not present

## 2019-04-03 DIAGNOSIS — N200 Calculus of kidney: Secondary | ICD-10-CM

## 2019-04-03 DIAGNOSIS — Z7982 Long term (current) use of aspirin: Secondary | ICD-10-CM | POA: Diagnosis not present

## 2019-04-03 DIAGNOSIS — Z888 Allergy status to other drugs, medicaments and biological substances status: Secondary | ICD-10-CM | POA: Insufficient documentation

## 2019-04-03 DIAGNOSIS — E039 Hypothyroidism, unspecified: Secondary | ICD-10-CM | POA: Insufficient documentation

## 2019-04-03 DIAGNOSIS — N1330 Unspecified hydronephrosis: Secondary | ICD-10-CM

## 2019-04-03 DIAGNOSIS — N132 Hydronephrosis with renal and ureteral calculous obstruction: Secondary | ICD-10-CM | POA: Diagnosis not present

## 2019-04-03 DIAGNOSIS — R1111 Vomiting without nausea: Secondary | ICD-10-CM | POA: Diagnosis not present

## 2019-04-03 DIAGNOSIS — R109 Unspecified abdominal pain: Secondary | ICD-10-CM | POA: Diagnosis not present

## 2019-04-03 DIAGNOSIS — N202 Calculus of kidney with calculus of ureter: Secondary | ICD-10-CM | POA: Diagnosis not present

## 2019-04-03 LAB — URINALYSIS, MICROSCOPIC (REFLEX): Bacteria, UA: NONE SEEN

## 2019-04-03 LAB — URINALYSIS, ROUTINE W REFLEX MICROSCOPIC
Bilirubin Urine: NEGATIVE
Glucose, UA: NEGATIVE mg/dL
Ketones, ur: NEGATIVE mg/dL
Leukocytes,Ua: NEGATIVE
Nitrite: NEGATIVE
Protein, ur: 100 mg/dL — AB
Specific Gravity, Urine: >1.03 — ABNORMAL HIGH (ref 1.005–1.030)
pH: 6 (ref 5.0–8.0)

## 2019-04-03 LAB — BASIC METABOLIC PANEL
Anion gap: 14 (ref 5–15)
BUN: 11 mg/dL (ref 6–20)
CO2: 24 mmol/L (ref 22–32)
Calcium: 8.9 mg/dL (ref 8.9–10.3)
Chloride: 100 mmol/L (ref 98–111)
Creatinine, Ser: 0.79 mg/dL (ref 0.44–1.00)
GFR calc Af Amer: >60 mL/min (ref >60)
GFR calc non Af Amer: >60 mL/min (ref >60)
Glucose, Bld: 157 mg/dL — ABNORMAL HIGH (ref 70–99)
Potassium: 3.3 mmol/L — ABNORMAL LOW (ref 3.5–5.1)
Sodium: 138 mmol/L (ref 135–145)

## 2019-04-03 MED ORDER — ONDANSETRON HCL 4 MG/2ML IJ SOLN
4.0000 mg | Freq: Once | INTRAMUSCULAR | Status: AC
  Administered 2019-04-03: 03:00:00 4 mg via INTRAVENOUS
  Filled 2019-04-03: qty 2

## 2019-04-03 MED ORDER — KETOROLAC TROMETHAMINE 30 MG/ML IJ SOLN
15.0000 mg | Freq: Once | INTRAMUSCULAR | Status: AC
  Administered 2019-04-03: 15 mg via INTRAVENOUS
  Filled 2019-04-03: qty 1

## 2019-04-03 MED ORDER — OXYCODONE-ACETAMINOPHEN 5-325 MG PO TABS
1.0000 | ORAL_TABLET | Freq: Once | ORAL | Status: AC
  Administered 2019-04-03: 1 via ORAL
  Filled 2019-04-03: qty 1

## 2019-04-03 MED ORDER — FENTANYL CITRATE (PF) 100 MCG/2ML IJ SOLN
100.0000 ug | Freq: Once | INTRAMUSCULAR | Status: AC
  Administered 2019-04-03: 100 ug via INTRAVENOUS
  Filled 2019-04-03: qty 2

## 2019-04-03 MED ORDER — DICLOFENAC SODIUM ER 100 MG PO TB24: 100.0000 mg | ORAL_TABLET | Freq: Every day | ORAL | 0 refills | Status: DC

## 2019-04-03 MED ORDER — ONDANSETRON 8 MG PO TBDP: ORAL_TABLET | ORAL | 0 refills | Status: DC

## 2019-04-03 NOTE — ED Provider Notes (Signed)
[facility] EMERGENCY DEPARTMENT Provider Note   CSN: [id number] Arrival date & time: 04/03/19  3419     History   Chief Complaint Chief Complaint  Patient presents with   Flank Pain    HPI [name] is a 53 y.o. female.     The history is provided by the patient.  Flank Pain This is a recurrent problem. The current episode started 12 to 24 hours ago. The problem occurs constantly. The problem has not changed since onset.Pertinent negatives include no chest pain, no headaches and no shortness of breath. Nothing aggravates the symptoms. Nothing relieves the symptoms. She has tried nothing for the symptoms. The treatment provided no relief.  Patient s/p shock wave done at [facility] yesterday presents with ongoing pain and emesis post procedure.  Took EMS to the hospital earlier and was given fentanyl but left due to wait.  She reports she called the urologist and phenergan suppositories were called in.  She states hydrocodone is not helping.  No f/c/r.    Past Medical History:  Diagnosis Date   Anemia    anemia  when pregnant in 2002   Anxiety    Arthritis    "knees and back" (09/28/2016)   Asthma 5 yrs ago    told she had asthma.. does not remember doctors name.    Chronic lower back pain    Depression    takes citalopram   History of hiatal hernia    History of kidney stones    Hyperlipidemia    Hypothyroidism    LBBB (left bundle branch block)    rate-related; identified on stress test 03/21/16   Migraine    "weekly" (09/28/2016)   Positive TB test 2008   pt states she took 9 months of medicine for positive tb skin test   Shortness of breath    with exertion   Thyroid disease    Type II diabetes mellitus ([place]) dx'd 2016    Patient Active Problem List   Diagnosis Date Noted   Pelvic pain 02/23/2019   HNP (herniated nucleus pulposus), cervical 01/04/2019   Epigastric mass 10/21/2018   Myalgia 03/18/2018   Hyperlipidemia associated with  type 2 diabetes mellitus ([place]) 03/18/2018   Nausea and vomiting 12/02/2017   Hematuria 12/02/2017   Pansinusitis 11/19/2017   Agoraphobia with panic attacks 11/19/2017   Preventative health care 09/27/2017   Hyperlipidemia LDL goal <100 09/18/2017   Vitamin D deficiency 09/18/2017   Hypothyroidism 09/18/2017   Other intervertebral disc degeneration, lumbar region 04/07/2017   Left hip pain 04/07/2017   Hyperlipidemia LDL goal <70 04/07/2017   Essential hypertension 04/07/2017   Acute vaginitis 04/07/2017   S/P complete thyroidectomy 09/28/2016   Memory loss 08/23/2016   Thyroid nodule 08/02/2016   Plantar fasciitis of right foot 12/01/2015   Knee pain, right 12/01/2015   Headache, migraine 09/06/2015   Bilateral knee pain 08/22/2015   Thoracic myofascial strain 08/22/2015   Dysphagia 04/10/2015   Chest pain 04/10/2015   Epigastric pain 04/10/2015   Functional diarrhea 04/10/2015   NSAID long-term use 04/10/2015   Midepigastric pain 03/22/2015   Cough 01/07/2015   Allergic rhinitis 12/13/2014   Acute bronchitis 12/13/2014   Blood in stool 10/01/2014   DM (diabetes mellitus) type II uncontrolled, periph vascular disorder ([place]) 08/13/2014   Abnormal ECG 05/14/2012   Breast pain 10/23/2011   ASCUS on Pap smear 10/08/2011   H/O thyroid nodule 08/27/2011   Depression with anxiety 08/27/2011   Heavy  periods 08/27/2011    Past Surgical History:  Procedure Laterality Date   DILATION AND CURETTAGE OF UTERUS  1999   ENDOMETRIAL ABLATION     LAPAROSCOPY     years ago ..pt does not know where surgery was done ... states" my stomach would blow up then go down"   THYROIDECTOMY  09/28/2011   Procedure: THYROIDECTOMY;  Surgeon: Izora Gala, MD;  Location: [facility];  Service: ENT;  Laterality: Right;  RIGHT THYROIDECTOMY WITH FROZEN SECTION   THYROIDECTOMY  09/28/2016   completion of thyroidectomy/notes 09/28/2016   THYROIDECTOMY Left 09/28/2016    Procedure: COMPLETION OF THYROIDECTOMY;  Surgeon: Izora Gala, MD;  Location: [facility];  Service: ENT;  Laterality: Left;   TUBAL LIGATION       OB History    Gravida  5   Para  3   Term  3   Preterm      AB  2   Living  3     SAB  2   TAB      Ectopic      Multiple      Live Births               Home Medications    Prior to Admission medications   Medication Sig Start Date End Date Taking? Authorizing Provider  acetaminophen (TYLENOL) 500 MG tablet Take 1,000 mg by mouth every 6 (six) hours as needed for mild pain.    [provider]  ALPRAZolam Duanne Moron) 0.5 MG tablet Take 1 tablet (0.5 mg total) by mouth 3 (three) times daily as needed for anxiety. 11/19/17   [name], DO  aspirin EC 81 MG tablet Take 1 tablet (81 mg total) by mouth daily. 03/07/16   End, Harrell Gave, MD  blood glucose meter kit and supplies KIT Dispense based on patient and insurance preference. Use up to four times daily as directed. (FOR ICD-9 250.00, 250.01). 09/17/17   Carollee Herter, Alferd Apa, DO  Cholecalciferol (VITAMIN D3) 2000 units TABS Take 2,000 Units by mouth daily.     [provider]  citalopram (CELEXA) 20 MG tablet Take 1 tablet (20 mg total) by mouth daily. 08/28/18   [name], DO  cyanocobalamin 500 MCG tablet Take 1,000 mcg by mouth daily.     [provider]  diazepam (VALIUM) 5 MG tablet Take as directed prior to procedure. 03/16/19   Marybelle Killings, MD  Diclofenac Sodium CR 100 MG 24 hr tablet Take 1 tablet (100 mg total) by mouth daily. 04/03/19   Shalice Woodring, MD  Flaxseed, Linseed, (FLAXSEED OIL PO) Take 1,300 mg by mouth daily.    [provider]  fluticasone (FLONASE) 50 MCG/ACT nasal spray Place 2 sprays into both nostrils daily. 11/19/17   Roma Schanz R, DO  fluticasone (FLONASE) 50 MCG/ACT nasal spray Place 2 sprays into both nostrils daily. Patient not taking: Reported on 03/30/2019 01/26/19   Roma Schanz  R, DO  gabapentin (NEURONTIN) 300 MG capsule Take 1 capsule (300 mg total) by mouth 3 (three) times daily. Can start with 1 capsule at nighttime and increase to taking 1 capsule 3 times daily. Patient not taking: Reported on 03/30/2019 09/25/18   Dene Gentry, MD  Ginkgo Biloba Extract 60 MG CAPS Take 60 mg by mouth daily.    [provider]  glimepiride (AMARYL) 2 MG tablet Take 1 tablet (2 mg total) by mouth daily before breakfast. 10/07/18  Carollee Herter, Kendrick Fries R, DO  glucose blood (ONETOUCH VERIO) test strip [facility] Check blood sugar twice daily 03/12/19   Carollee Herter, Alferd Apa, DO  hydrochlorothiazide (HYDRODIURIL) 25 MG tablet Take 1 tablet (25 mg total) by mouth daily. Patient taking differently: Take 25 mg by mouth daily as needed.  08/28/18   [name], DO  HYDROcodone-acetaminophen (NORCO) 5-325 MG tablet Take 1 tablet by mouth every 6 (six) hours as needed for moderate pain. 03/24/19   [name], DO  levocetirizine (XYZAL) 5 MG tablet Take 1 tablet (5 mg total) by mouth every evening. Patient not taking: Reported on 03/30/2019 01/26/19   Carollee Herter, Alferd Apa, DO  levothyroxine (SYNTHROID) 100 MCG tablet Take 1 tablet (100 mcg total) by mouth daily. 09/28/16   Izora Gala, MD  losartan (COZAAR) 25 MG tablet Take 1 tablet by mouth once daily 10/06/18   Carollee Herter, Alferd Apa, DO  metFORMIN (GLUCOPHAGE XR) 500 MG 24 hr tablet Take 2 tablets by mouth daily Patient not taking: Reported on 03/30/2019 03/05/19   [name], DO  Omega-3 Fatty Acids (FISH OIL) 1200 MG CAPS Take 1,200 mg by mouth daily.    [provider]  ondansetron (ZOFRAN ODT) 8 MG disintegrating tablet 54m ODT q8 hours prn nausea 04/03/19   Audriana Aldama, MD  ondansetron (ZOFRAN) 4 MG tablet Take 1 tablet (4 mg total) by mouth every 8 (eight) hours as needed for nausea or vomiting. Patient not taking: Reported on 03/30/2019 03/12/19   LCarollee Herter YAlferd Apa DO  OneTouch Delica  Lancets 378HMISC 1 Device by Does not apply route 2 (two) times daily as needed. 03/12/19   LAnn Held DO  pantoprazole (PROTONIX) 40 MG tablet Take 1 tablet (40 mg total) by mouth daily. Patient not taking: Reported on 03/30/2019 08/28/18   LRoma SchanzR, DO  rosuvastatin (CRESTOR) 20 MG tablet Take 1 tablet (20 mg total) by mouth daily. 08/28/18   Lowne Chase, Yvonne R, DO  Semaglutide (RYBELSUS) 3 MG TABS Take 1 tablet by mouth daily. 03/05/19   LAnn Held DO  sitaGLIPtin-metformin (JANUMET) 50-1000 MG tablet Take 2 tablets by mouth daily with breakfast. Patient not taking: Reported on 03/30/2019 07/10/18   LAnn Held DO  tamsulosin (FLOMAX) 0.4 MG CAPS capsule Take 1 capsule (0.4 mg total) by mouth daily. 04/02/19   WCeasar Mons MD    Family History Family History  Problem Relation Age of Onset   Diabetes Mother    Hypertension Mother    Hyperlipidemia Mother    Gallbladder disease Mother    Heart disease Mother    Diabetes Sister    Hypertension Father    Hyperlipidemia Father    Colon polyps Father    Ovarian cancer Maternal Grandmother    Gallbladder disease Maternal Grandmother    Breast cancer Maternal Grandmother 533  Colon cancer Paternal Grandfather    Breast cancer Maternal Aunt        late 459s  Heart disease Other        early cad   Diabetes Brother    Heart attack Brother    Anesthesia problems Neg Hx    Hypotension Neg Hx    Malignant hyperthermia Neg Hx    Pseudochol deficiency Neg Hx    Esophageal cancer Neg Hx     Social History Social History   Tobacco Use   Smoking status: Never  Smoker   Smokeless tobacco: Never Used  Substance Use Topics   Alcohol use: No    Alcohol/week: 0.0 standard drinks   Drug use: No     Allergies   Lisinopril   Review of Systems Review of Systems  Constitutional: Negative for fever.  HENT: Negative for congestion.   Eyes: Negative for  visual disturbance.  Respiratory: Negative for shortness of breath.   Cardiovascular: Negative for chest pain.  Gastrointestinal: Positive for nausea and vomiting.  Genitourinary: Positive for flank pain. Negative for dysuria.  Musculoskeletal: Negative for arthralgias.  Skin: Negative for color change.  Neurological: Negative for headaches.  Psychiatric/Behavioral: Negative for agitation.  All other systems reviewed and are negative.    Physical Exam Updated Vital Signs BP (!) 135/98    Pulse 84    Temp 97.9 F (36.6 C) (Oral)    Ht _0  (1.676 m)    Wt 99.8 kg    SpO2 99%    BMI 35.51 kg/m   Physical Exam Vitals signs and nursing note reviewed.  Constitutional:      General: She is not in acute distress.    Appearance: Normal appearance.  HENT:     Head: Normocephalic and atraumatic.     Nose: Nose normal.  Eyes:     Conjunctiva/sclera: Conjunctivae normal.     Pupils: Pupils are equal, round, and reactive to light.  Neck:     Musculoskeletal: Normal range of motion and neck supple.  Cardiovascular:     Rate and Rhythm: Normal rate and regular rhythm.     Pulses: Normal pulses.     Heart sounds: Normal heart sounds.  Pulmonary:     Effort: Pulmonary effort is normal.     Breath sounds: Normal breath sounds.  Abdominal:     General: Abdomen is flat. Bowel sounds are normal.     Tenderness: There is no abdominal tenderness. There is no guarding or rebound.  Musculoskeletal: Normal range of motion.  Skin:    General: Skin is warm and dry.     Capillary Refill: Capillary refill takes less than 2 seconds.  Neurological:     General: No focal deficit present.     Mental Status: She is alert and oriented to person, place, and time.  Psychiatric:        Mood and Affect: Mood normal.      ED Treatments / Results  Labs (all labs ordered are listed, but only abnormal results are displayed) Results for orders placed or performed during the hospital encounter of  04/03/19  Urinalysis, Routine w reflex microscopic  Result Value Ref Range   Color, Urine YELLOW YELLOW   APPearance CLEAR CLEAR   Specific Gravity, Urine >1.030 (H) 1.005 - 1.030   pH 6.0 5.0 - 8.0   Glucose, UA NEGATIVE NEGATIVE mg/dL   Hgb urine dipstick SMALL (A) NEGATIVE   Bilirubin Urine NEGATIVE NEGATIVE   Ketones, ur NEGATIVE NEGATIVE mg/dL   Protein, ur 100 (A) NEGATIVE mg/dL   Nitrite NEGATIVE NEGATIVE   Leukocytes,Ua NEGATIVE NEGATIVE  Basic metabolic panel  Result Value Ref Range   Sodium 138 135 - 145 mmol/L   Potassium 3.3 (L) 3.5 - 5.1 mmol/L   Chloride 100 98 - 111 mmol/L   CO2 24 22 - 32 mmol/L   Glucose, Bld 157 (H) 70 - 99 mg/dL   BUN 11 6 - 20 mg/dL   Creatinine, Ser 0.79 0.44 - 1.00 mg/dL   Calcium 8.9 8.9 -  10.3 mg/dL   GFR calc non Af Amer >60 >60 mL/min   GFR calc Af Amer >60 >60 mL/min   Anion gap 14 5 - 15  Urinalysis, Microscopic (reflex)  Result Value Ref Range   RBC / HPF 0-5 0 - 5 RBC/hpf   WBC, UA 0-5 0 - 5 WBC/hpf   Bacteria, UA NONE SEEN NONE SEEN   Squamous Epithelial / LPF 0-5 0 - 5   Dg Abd 1 View  Result Date: 04/02/2019 CLINICAL DATA:  Preop for lithotripsy of right-sided renal calculus. EXAM: ABDOMEN - 1 VIEW COMPARISON:  CT study 03/24/2019 FINDINGS: Two renal calculi project over right renal contour in the more central portion of the renal shadow, largest approximately 19 mm. Measurements likely affected by magnification, grossly similar to study of 03/24/2019. No additional calculi are seen. Stool and gas overlies renal contours. IMPRESSION: Stable appearance of renal calculi accounting for magnification on the current exam, calculi project over right renal contour. Electronically Signed   By: Zetta Bills M.D.   On: 04/02/2019 09:39   Mr Lumbar Spine W/o Contrast  Result Date: 03/15/2019 CLINICAL DATA:  Lower back pain radiating down bilateral legs EXAM: MRI LUMBAR SPINE WITHOUT CONTRAST TECHNIQUE: Multiplanar, multisequence MR  imaging of the lumbar spine was performed. No intravenous contrast was administered. COMPARISON:  None. FINDINGS: Segmentation:  Standard. Alignment: 1 mm anterolisthesis of L3 on L4 secondary to facet disease. Vertebrae:  No fracture, evidence of discitis, or bone lesion. Conus medullaris and cauda equina: Conus extends to the L1 level. Conus and cauda equina appear normal. Paraspinal and other soft tissues: No acute paraspinal abnormality. Disc levels: Disc spaces: Disc spaces are maintained. Mild disc desiccation at L3-4 and L5-S1. T12-L1: No significant disc bulge. No evidence of neural foraminal stenosis. No central canal stenosis. L1-L2: No significant disc bulge. No evidence of neural foraminal stenosis. No central canal stenosis. L2-L3: No significant disc bulge. No evidence of neural foraminal stenosis. No central canal stenosis. L3-L4: Mild broad-based disc bulge. Mild bilateral facet arthropathy. No evidence of neural foraminal stenosis. No central canal stenosis. L4-L5: No significant disc bulge. No evidence of neural foraminal stenosis. No central canal stenosis. L5-S1: No significant disc bulge. No evidence of neural foraminal stenosis. No central canal stenosis. IMPRESSION: 1. At L3-4 there is a mild broad-based disc bulge with mild bilateral facet arthropathy. No significant lumbar spine disc protrusion, foraminal stenosis or central canal stenosis. Electronically Signed   By: Kathreen Devoid   On: 03/15/2019 09:40   Ct Renal Stone Study  Result Date: 04/03/2019 CLINICAL DATA:  Lithotripsy yesterday, right flank pain and vomiting EXAM: CT ABDOMEN AND PELVIS WITHOUT CONTRAST TECHNIQUE: Multidetector CT imaging of the abdomen and pelvis was performed following the standard protocol without IV contrast. COMPARISON:  March 24, 2019 FINDINGS: Lower chest: The visualized heart size within normal limits. No pericardial fluid/thickening. No hiatal hernia. Bibasilar streaky atelectasis is seen.  Hepatobiliary: Although limited due to the lack of intravenous contrast, normal in appearance without gross focal abnormality. No evidence of calcified gallstones or biliary ductal dilatation. Pancreas:  Unremarkable.  No surrounding inflammatory changes. Spleen: Normal in size. Although limited due to the lack of intravenous contrast, normal in appearance. Adrenals/Urinary Tract: Both adrenal glands appear normal. Again noted is multiple calcifications within the lower pole of the right kidney the largest cluster in the lower measuring 1.1 cm. There is new enlargement of the right kidney with perinephric stranding moderate pelvicaliectasis and ureterectasis with ureteral stranding  down to the level of the posterior right bladder were there is a 1 cm linear cluster of calcifications in the posterior bladder. The left kidney is unremarkable. No left-sided renal or collecting system calculi. Stomach/Bowel: The stomach, small bowel, and colon are normal in appearance. No inflammatory changes or obstructive findings. appendix is normal. Vascular/Lymphatic: There are no enlarged abdominal or pelvic lymph nodes. No significant gross vascular findings are present. Reproductive: The uterus and adnexa are unremarkable. Coarse calcifications seen within the fundal uterine fibroids. Other: No evidence of abdominal wall mass or hernia. Musculoskeletal: No acute or significant osseous findings. IMPRESSION: 1. New 1 cm linear cluster calcifications the posterior right bladder causing moderate right hydronephrosis and ureterectasis with significant perinephric and periureteral stranding. 2. multiple calcifications in the lower pole of the right kidney the largest cluster measuring 1.1 cm. Electronically Signed   By: Prudencio Pair M.D.   On: 04/03/2019 03:35   Ct Renal Stone Study  Result Date: 03/24/2019 CLINICAL DATA:  53 year old female with flank pain. Concern for kidney stone. EXAM: CT ABDOMEN AND PELVIS WITHOUT CONTRAST  TECHNIQUE: Multidetector CT imaging of the abdomen and pelvis was performed following the standard protocol without IV contrast. COMPARISON:  CT of the abdomen pelvis dated 10/24/2018 FINDINGS: Evaluation of this exam is limited in the absence of intravenous contrast. Lower chest: The visualized lung bases are clear. No intra-abdominal free air or free fluid. Hepatobiliary: Diffuse fatty infiltration of the liver. No intrahepatic biliary ductal dilatation. The gallbladder is unremarkable. Pancreas: Unremarkable. No pancreatic ductal dilatation or surrounding inflammatory changes. Spleen: Normal in size without focal abnormality. Adrenals/Urinary Tract: The adrenal glands are unremarkable. There is a 14 mm stone in the right renal pelvis. There is mild associated right hydronephrosis. Additional calculi or clusters of stones noted in the inferior pole of the right kidney. The left kidney is unremarkable. The visualized ureters and urinary bladder appear unremarkable as well. Stomach/Bowel: There is sigmoid diverticulosis without active inflammatory changes. There is no bowel obstruction or active inflammation. The appendix is normal. Vascular/Lymphatic: The abdominal aorta and IVC are grossly unremarkable on this noncontrast CT. No portal venous gas. There is no adenopathy. Reproductive: The uterus is anteverted. Small calcified fibroids noted. The ovaries are grossly unremarkable. Other: None Musculoskeletal: Disc desiccation and vacuum phenomena at L5-S1. Multiple facet desiccation noted. No acute osseous pathology. IMPRESSION: 1. A 14 mm right renal pelvis stone with mild right hydronephrosis. Overall new or increased degree of hydronephrosis since the prior CT. Additional smaller calculi or clusters of stones noted in the inferior pole of the right kidney. 2. Fatty liver. 3. Sigmoid diverticulosis. No bowel obstruction or active inflammation. Normal appendix. Electronically Signed   By: Anner Crete M.D.    On: 03/24/2019 20:44   US Pelvic Complete With Transvaginal  Result Date: 03/04/2019 CLINICAL DATA:  Pelvic pain for several days EXAM: TRANSABDOMINAL AND TRANSVAGINAL ULTRASOUND OF PELVIS TECHNIQUE: Both transabdominal and transvaginal ultrasound examinations of the pelvis were performed. Transabdominal technique was performed for global imaging of the pelvis including uterus, ovaries, adnexal regions, and pelvic cul-de-sac. It was necessary to proceed with endovaginal exam following the transabdominal exam to visualize the uterus endometrium ovaries. COMPARISON:  None FINDINGS: Uterus Measurements: 7.1 x 4.3 by 6.6 cm = volume: 106 mL. Multiple calcified fibroids. Right fundal fibroid measures 2.1 x 2.2 x 2.1 cm. Left fundal fibroid measures 2.1 x 1.8 x 1.9 cm. Midline fundal fibroid measures 1.3 x 1.4 x 1.3 cm Endometrium Thickness: 5 mm.  No focal abnormality visualized. Right ovary Measurements: 2.3 x 2.1 x 1.6 cm = volume: 4.2 mL. Normal appearance/no adnexal mass. Left ovary Measurements: 3 x 1.6 x 2.3 cm = volume: 5.8 mL. Normal appearance/no adnexal mass. Other findings No abnormal free fluid. IMPRESSION: 1. Multiple uterine fibroids. 2. Otherwise negative pelvic ultrasound Electronically Signed   By: Donavan Foil M.D.   On: 03/04/2019 20:35    Radiology Dg Abd 1 View  Result Date: 04/02/2019 CLINICAL DATA:  Preop for lithotripsy of right-sided renal calculus. EXAM: ABDOMEN - 1 VIEW COMPARISON:  CT study 03/24/2019 FINDINGS: Two renal calculi project over right renal contour in the more central portion of the renal shadow, largest approximately 19 mm. Measurements likely affected by magnification, grossly similar to study of 03/24/2019. No additional calculi are seen. Stool and gas overlies renal contours. IMPRESSION: Stable appearance of renal calculi accounting for magnification on the current exam, calculi project over right renal contour. Electronically Signed   By: Zetta Bills M.D.   On:  04/02/2019 09:39   Ct Renal Stone Study  Result Date: 04/03/2019 CLINICAL DATA:  Lithotripsy yesterday, right flank pain and vomiting EXAM: CT ABDOMEN AND PELVIS WITHOUT CONTRAST TECHNIQUE: Multidetector CT imaging of the abdomen and pelvis was performed following the standard protocol without IV contrast. COMPARISON:  March 24, 2019 FINDINGS: Lower chest: The visualized heart size within normal limits. No pericardial fluid/thickening. No hiatal hernia. Bibasilar streaky atelectasis is seen. Hepatobiliary: Although limited due to the lack of intravenous contrast, normal in appearance without gross focal abnormality. No evidence of calcified gallstones or biliary ductal dilatation. Pancreas:  Unremarkable.  No surrounding inflammatory changes. Spleen: Normal in size. Although limited due to the lack of intravenous contrast, normal in appearance. Adrenals/Urinary Tract: Both adrenal glands appear normal. Again noted is multiple calcifications within the lower pole of the right kidney the largest cluster in the lower measuring 1.1 cm. There is new enlargement of the right kidney with perinephric stranding moderate pelvicaliectasis and ureterectasis with ureteral stranding down to the level of the posterior right bladder were there is a 1 cm linear cluster of calcifications in the posterior bladder. The left kidney is unremarkable. No left-sided renal or collecting system calculi. Stomach/Bowel: The stomach, small bowel, and colon are normal in appearance. No inflammatory changes or obstructive findings. appendix is normal. Vascular/Lymphatic: There are no enlarged abdominal or pelvic lymph nodes. No significant gross vascular findings are present. Reproductive: The uterus and adnexa are unremarkable. Coarse calcifications seen within the fundal uterine fibroids. Other: No evidence of abdominal wall mass or hernia. Musculoskeletal: No acute or significant osseous findings. IMPRESSION: 1. New 1 cm linear cluster  calcifications the posterior right bladder causing moderate right hydronephrosis and ureterectasis with significant perinephric and periureteral stranding. 2. multiple calcifications in the lower pole of the right kidney the largest cluster measuring 1.1 cm. Electronically Signed   By: Prudencio Pair M.D.   On: 04/03/2019 03:35    Procedures Procedures (including critical care time)  Medications Ordered in ED Medications  oxyCODONE-acetaminophen (PERCOCET/ROXICET) 5-325 MG per tablet 1 tablet (has no administration in time range)  ondansetron (ZOFRAN) injection 4 mg (4 mg Intravenous Given 04/03/19 0312)  ketorolac (TORADOL) 30 MG/ML injection 15 mg (15 mg Intravenous Given 04/03/19 0312)  fentaNYL (SUBLIMAZE) injection 100 mcg (100 mcg Intravenous Given 04/03/19 0338)     450 Case d/w Dr. Alyson Ingles, of urology.  Pain control and call to be seen in the office today.  No indication for  antibiotics at this time.    Patient's pain is currently a 2/10.  No further vomiting in the ED.  Sleeping in the room. PO challenged successfully.  She is stable for discharge and understands that she is to call the office in the am to be seen for ongoing symptoms.    KINISHA SOPER was evaluated in Emergency Department on 04/03/2019 for the symptoms described in the history of present illness. She was evaluated in the context of the global COVID-19 pandemic, which necessitated consideration that the patient might be at risk for infection with the SARS-CoV-2 virus that causes COVID-19. Institutional protocols and algorithms that pertain to the evaluation of patients at risk for COVID-19 are in a state of rapid change based on information released by regulatory bodies including the CDC and federal and state organizations. These policies and algorithms were followed during the patient's care in the ED.      Final Clinical Impressions(s) / ED Diagnoses   Return for intractable cough, coughing up blood,fevers >100.4  unrelieved by medication, shortness of breath, intractable vomiting, chest pain, shortness of breath, weakness,numbness, changes in speech, facial asymmetry,abdominal pain, passing out,Inability to tolerate liquids or food, cough, altered mental status or any concerns. No signs of systemic illness or infection. The patient is nontoxic-appearing on exam and vital signs are within normal limits.   I have reviewed the triage vital signs and the nursing notes. Pertinent labs &imaging results that were available during my care of the patient were reviewed by me and considered in my medical decision making (see chart for details).After history, exam, and medical workup I feel the patient has beenappropriately medically screened and is safe for discharge home. Pertinent diagnoses were discussed with the patient. Patient was given return precautions. ED Discharge Orders         Ordered    ondansetron (ZOFRAN ODT) 8 MG disintegrating tablet     04/03/19 0401    Diclofenac Sodium CR 100 MG 24 hr tablet  Daily     04/03/19 0401           Jamira Barfuss, MD 04/03/19 0289

## 2019-04-03 NOTE — ED Triage Notes (Signed)
Pt c/o right flank pain with vomiting. Pt states she had lithotripsy yesterday.

## 2019-04-05 ENCOUNTER — Telehealth: Payer: Self-pay | Admitting: Student in an Organized Health Care Education/Training Program

## 2019-04-05 NOTE — Telephone Encounter (Signed)
Patient is trying to pass some known residual stone after ESWL last week.  She called on-call pager with continued back pain.  She had one episode of emesis but nausea has improved.  She denies fevers, chills.  She is hesitant to go to emergency department and would prefer seeing if stones will pass and calling us tomorrow for update.  This is reasonable at this time.  I advised her on maximizing safe dosages of Norco (okay to take 10 mg instead of 5 mg every 6 hours), recommended good p.o. hydration, and Flomax which she has at home.  She will come to emergency department if pain worsens or she develops fevers today.  Otherwise, she will call urology clinic tomorrow if pain continues and she is not able to pass the stones.

## 2019-04-06 ENCOUNTER — Encounter: Payer: Self-pay | Admitting: Family Medicine

## 2019-04-07 DIAGNOSIS — N132 Hydronephrosis with renal and ureteral calculous obstruction: Secondary | ICD-10-CM | POA: Diagnosis not present

## 2019-04-07 DIAGNOSIS — R8271 Bacteriuria: Secondary | ICD-10-CM | POA: Diagnosis not present

## 2019-04-07 DIAGNOSIS — N202 Calculus of kidney with calculus of ureter: Secondary | ICD-10-CM | POA: Diagnosis not present

## 2019-04-07 DIAGNOSIS — N23 Unspecified renal colic: Secondary | ICD-10-CM | POA: Diagnosis not present

## 2019-04-08 ENCOUNTER — Encounter: Payer: BLUE CROSS/BLUE SHIELD | Admitting: Obstetrics & Gynecology

## 2019-04-09 ENCOUNTER — Ambulatory Visit: Payer: Self-pay | Admitting: *Deleted

## 2019-04-09 NOTE — Telephone Encounter (Signed)
Chest pain since yesterday- sharp pains on L side. Last 2 days patient was having hard time catching breath- but she states that is better now. Patient reports back pain on the R- now on L. Patient states she had arm pain yesterday on the L. Patient states the pain comes and goes- but is a 10 when it occurs- lasting only seconds. Patient states it has happened while answering triage questions- advised patient ED per protocol.  Reason for Disposition . [1] Chest pain (or "angina") comes and goes AND [2] is happening more often (increasing in frequency) or getting worse (increasing in severity)  Answer Assessment - Initial Assessment Questions 1. LOCATION: "Where does it hurt?"       Left side of chest- upper chest 2. RADIATION: "Does the pain go anywhere else?" (e.g., into neck, jaw, arms, back)     No radiation- did have pain in L arm yesterday- above the elbow 3. ONSET: "When did the chest pain begin?" (Minutes, hours or days)      Yesterday- 11:00 4. PATTERN "Does the pain come and go, or has it been constant since it started?"  "Does it get worse with exertion?"      Comes and goes 5. DURATION: "How long does it last" (e.g., seconds, minutes, hours)     seconds 6. SEVERITY: "How bad is the pain?"  (e.g., Scale 1-10; mild, moderate, or severe)    - MILD (1-3): doesn't interfere with normal activities     - MODERATE (4-7): interferes with normal activities or awakens from sleep    - SEVERE (8-10): excruciating pain, unable to do any normal activities       Close to 10- doesn't last that long 7. CARDIAC RISK FACTORS: "Do you have any history of heart problems or risk factors for heart disease?" (e.g., angina, prior heart attack; diabetes, high blood pressure, high cholesterol, smoker, or strong family history of heart disease)     Diabetic, high cholesterol  8. PULMONARY RISK FACTORS: "Do you have any history of lung disease?"  (e.g., blood clots in lung, asthma, emphysema, birth control  pills)     no 9. CAUSE: "What do you think is causing the chest pain?"     unknown 10. OTHER SYMPTOMS: "Do you have any other symptoms?" (e.g., dizziness, nausea, vomiting, sweating, fever, difficulty breathing, cough)       Nausea- kidney stone 11. PREGNANCY: "Is there any chance you are pregnant?" "When was your last menstrual period?"       n/a  Protocols used: CHEST PAIN-A-AH

## 2019-04-14 ENCOUNTER — Ambulatory Visit (INDEPENDENT_AMBULATORY_CARE_PROVIDER_SITE_OTHER): Payer: BLUE CROSS/BLUE SHIELD | Admitting: Medical

## 2019-04-14 ENCOUNTER — Other Ambulatory Visit: Payer: Self-pay

## 2019-04-14 ENCOUNTER — Encounter: Payer: Self-pay | Admitting: Medical

## 2019-04-14 DIAGNOSIS — J309 Allergic rhinitis, unspecified: Secondary | ICD-10-CM

## 2019-04-14 DIAGNOSIS — J320 Chronic maxillary sinusitis: Secondary | ICD-10-CM

## 2019-04-14 MED ORDER — DOXYCYCLINE HYCLATE 100 MG PO TABS: 100.0000 mg | ORAL_TABLET | Freq: Two times a day (BID) | ORAL | 0 refills | Status: DC

## 2019-04-14 NOTE — Patient Instructions (Signed)
You do appear to have sinus infection and year-round allergic rhinitis.  Will prescribe doxycycline antibiotic.  Rx advisement given.  Also do recommend that he use Flonase daily over the next 10 days.  After he finished antibiotic recommend that you start and stay on Xyzal throughout the fall and winter.  Also continue with Flonase.  During the pandemic I think is better today go ahead and aggressively treat allergy symptoms.  Work note printed and given to patient's daughter today.  She will give that to her mother.  Reception staff checked to make sure that was okay.  Follow-up in 7 to 10 days or as needed.

## 2019-04-14 NOTE — Progress Notes (Signed)
   Subjective:    Patient ID: Felicia Solis, female    DOB: Dec 06, 1965, 53 y.o.   MRN: [id number]  HPI  Virtual Visit via Video Note  I connected with Felicia Solis on 04/14/19 at  2:20 PM EDT by a video enabled telemedicine application and verified that I am speaking with the correct person using two identifiers.  Location: Patient: in car parked. Provider: home  Pt has not checked.bp but has cuff at home. Pt will call and update [place] by calling today or tomorrow.   I discussed the limitations of evaluation and management by telemedicine and the availability of in person appointments. The patient expressed understanding and agreed to proceed.  History of Present Illness:  Pt has some recent sinus pressure and ha over past 4-5 days. She states trying both tylenol and ibuprofen but has not helped. as frontal and maxillary pain. Mild occipital ha/neck tense.   Pt states hx of sinus infection. She thinks has infection presently.  Pt also has year round allergies.She has been on flonase sporadically.  Pt will get tested tomorrow at work for covid.   Observations/Objective:  General-no acute distress, pleasant, oriented. Lungs- on inspection lungs appear unlabored. Neck- no tracheal deviation or jvd on inspection. Neuro- gross motor function appears intact.  heent- frontal and maxillary sinus pressure. Rt side maxillary worse. Neuro- gross motor and sensory function intact.   Assessment and Plan: You do appear to have sinus infection and year-round allergic rhinitis.  Will prescribe doxycycline antibiotic.  Rx advisement given.  Also do recommend that he use Flonase daily over the next 10 days.  After he finished antibiotic recommend that you start and stay on Xyzal throughout the fall and winter.  Also continue with Flonase.  During the pandemic I think is better today go ahead and aggressively treat allergy symptoms.  Work note printed and given to patient's daughter today.  She  will give that to her mother.  Reception staff checked to make sure that was okay.  Follow-up in 7 to 10 days or as needed.  Follow Up Instructions:    I discussed the assessment and treatment plan with the patient. The patient was provided an opportunity to ask questions and all were answered. The patient agreed with the plan and demonstrated an understanding of the instructions.   The patient was advised to call back or seek an in-person evaluation if the symptoms worsen or if the condition fails to improve as anticipated.  I provided 25 minutes of non-face-to-face time during this encounter.   Mackie Pai, PA-C    Review of Systems  Constitutional: Negative for chills, fatigue and fever.  HENT: Positive for congestion, sinus pressure and sinus pain.   Respiratory: Negative for cough, shortness of breath and wheezing.   Cardiovascular: Negative for chest pain and palpitations.  Gastrointestinal: Negative for abdominal pain and diarrhea.  Musculoskeletal: Negative for back pain.  Skin: Negative for rash.  Neurological: Negative for dizziness and headaches.  Hematological: Negative for adenopathy. Does not bruise/bleed easily.  Psychiatric/Behavioral: Negative for behavioral problems and confusion.       Objective:   Physical Exam        Assessment & Plan:

## 2019-04-15 DIAGNOSIS — U071 COVID-19: Secondary | ICD-10-CM | POA: Diagnosis not present

## 2019-04-16 DIAGNOSIS — N201 Calculus of ureter: Secondary | ICD-10-CM | POA: Diagnosis not present

## 2019-04-20 ENCOUNTER — Encounter: Payer: Self-pay | Admitting: Family Medicine

## 2019-04-20 ENCOUNTER — Ambulatory Visit: Payer: Self-pay

## 2019-04-20 ENCOUNTER — Other Ambulatory Visit: Payer: Self-pay

## 2019-04-20 ENCOUNTER — Ambulatory Visit (INDEPENDENT_AMBULATORY_CARE_PROVIDER_SITE_OTHER): Payer: BLUE CROSS/BLUE SHIELD | Admitting: Family Medicine

## 2019-04-20 VITALS — BP 120/84 | HR 96 | Temp 97.7°F | Resp 18 | Ht 66.0 in | Wt 223.6 lb

## 2019-04-20 DIAGNOSIS — R079 Chest pain, unspecified: Secondary | ICD-10-CM

## 2019-04-20 DIAGNOSIS — N202 Calculus of kidney with calculus of ureter: Secondary | ICD-10-CM | POA: Diagnosis not present

## 2019-04-20 LAB — TROPONIN I: Troponin I: <0.01 ng/mL (ref <=0.0)

## 2019-04-20 LAB — D-DIMER, QUANTITATIVE: D-Dimer, Quant: 0.53 mcg/mL FEU — ABNORMAL HIGH (ref <0.50)

## 2019-04-20 NOTE — Telephone Encounter (Signed)
Appt scheduled

## 2019-04-20 NOTE — Patient Instructions (Signed)

## 2019-04-20 NOTE — Telephone Encounter (Signed)
Pt. Reports chest pain started 04/09/19. Comes and goes. Was triaged to go to ED that day, but states "I don't want to go to the ED." Pain is a 10/10 when it occurs. Sharp that lasts "a few seconds." At "different times I have shortness of breath." Denies any chest pain now. Warm transfer to [facility] in the practice.  Answer Assessment - Initial Assessment Questions 1. LOCATION: "Where does it hurt?"       Hurts in the middle and to the left 2. RADIATION: "Does the pain go anywhere else?" (e.g., into neck, jaw, arms, back)     No 3. ONSET: "When did the chest pain begin?" (Minutes, hours or days)      04/09/19 4. PATTERN "Does the pain come and go, or has it been constant since it started?"  "Does it get worse with exertion?"      Comes and goes 5. DURATION: "How long does it last" (e.g., seconds, minutes, hours)     A few seconds 6. SEVERITY: "How bad is the pain?"  (e.g., Scale 1-10; mild, moderate, or severe)    - MILD (1-3): doesn't interfere with normal activities     - MODERATE (4-7): interferes with normal activities or awakens from sleep    - SEVERE (8-10): excruciating pain, unable to do any normal activities       10 7. CARDIAC RISK FACTORS: "Do you have any history of heart problems or risk factors for heart disease?" (e.g., angina, prior heart attack; diabetes, high blood pressure, high cholesterol, smoker, or strong family history of heart disease)     No 8. PULMONARY RISK FACTORS: "Do you have any history of lung disease?"  (e.g., blood clots in lung, asthma, emphysema, birth control pills)     No 9. CAUSE: "What do you think is causing the chest pain?"     Unsure 10. OTHER SYMPTOMS: "Do you have any other symptoms?" (e.g., dizziness, nausea, vomiting, sweating, fever, difficulty breathing, cough)       No 11. PREGNANCY: "Is there any chance you are pregnant?" "When was your last menstrual period?"       No  Protocols used: CHEST PAIN-A-AH

## 2019-04-20 NOTE — Progress Notes (Signed)
Patient ID: Felicia Solis, female    DOB: 04/25/1966  Age: 53 y.o. MRN: [id number] ° ° ° °Subjective:  °Subjective  °HPI °Felicia Solis presents for chest pain on and off since the beginning of the month.  It occurs at rest or with activity.  Its a burning stinging and some sob.  Last episode of chest pain earlier today. Pt refused er  ° °Review of Systems  °Constitutional: Negative for appetite change, diaphoresis, fatigue and unexpected weight change.  °Eyes: Negative for pain, redness and visual disturbance.  °Respiratory: Positive for shortness of breath. Negative for cough, chest tightness and wheezing.   °Cardiovascular: Positive for chest pain. Negative for palpitations and leg swelling.  °Endocrine: Negative for cold intolerance, heat intolerance, polydipsia, polyphagia and polyuria.  °Genitourinary: Negative for difficulty urinating, dysuria and frequency.  °Neurological: Negative for dizziness, light-headedness, numbness and headaches.  ° ° °History °Past Medical History:  °Diagnosis Date  °• Anemia   ° anemia  when pregnant in 2002  °• Anxiety   °• Arthritis   ° "knees and back" (09/28/2016)  °• Asthma 5 yrs ago   ° told she had asthma.. does not remember doctors name.   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  ° °Her family history includes Breast cancer in her maternal aunt; Breast cancer (age of onset: 50) in her maternal grandmother; Colon cancer in her paternal grandfather; Colon polyps in her father; Diabetes in her brother, mother, and sister; Gallbladder disease in her maternal grandmother and mother; Heart attack in her brother; Heart disease in her mother and another family member; Hyperlipidemia in her father and mother; Hypertension in her father and mother; Ovarian cancer in her maternal grandmother.She reports that she has never smoked. She has never used smokeless tobacco. She reports that she does not drink alcohol or use drugs. ° °Current Outpatient Medications on File Prior to Visit  °Medication Sig Dispense Refill  °• acetaminophen (TYLENOL) 500 MG tablet Take 1,000 mg by mouth every 6 (six) hours as needed for mild pain.    °• ALPRAZolam (XANAX) 0.5 MG tablet Take 1 tablet (0.5 mg total) by mouth 3 (three) times daily as needed for anxiety. 10 tablet 0  °• aspirin EC 81 MG tablet Take 1 tablet (81 mg total) by mouth daily. 90 tablet 3  °• blood glucose meter kit and supplies KIT Dispense based on patient and insurance preference. Use up to four times daily as directed. (FOR ICD-9 250.00, 250.01). 1 each 0  °• Cholecalciferol (VITAMIN D3) 2000 units TABS Take 2,000 Units by mouth daily.     °• citalopram (CELEXA) 20 MG tablet Take 1 tablet (20 mg total) by mouth daily. 90 tablet   3   cyanocobalamin 500 MCG tablet Take 1,000 mcg by mouth daily.      diazepam (VALIUM) 5 MG tablet Take as directed prior to procedure. 3 tablet 0   Diclofenac Sodium CR 100 MG 24 hr tablet Take 1 tablet (100 mg total) by mouth daily. 7 tablet 0   doxycycline (VIBRA-TABS) 100 MG tablet Take 1 tablet (100 mg total) by mouth 2 (two) times daily. Can give caps or generic. 20 tablet 0   Flaxseed, Linseed, (FLAXSEED OIL PO) Take 1,300 mg by mouth daily.     fluticasone (FLONASE) 50 MCG/ACT  nasal spray Place 2 sprays into both nostrils daily. 16 g 6   fluticasone (FLONASE) 50 MCG/ACT nasal spray Place 2 sprays into both nostrils daily. 16 g 6   gabapentin (NEURONTIN) 300 MG capsule Take 1 capsule (300 mg total) by mouth 3 (three) times daily. Can start with 1 capsule at nighttime and increase to taking 1 capsule 3 times daily. 90 capsule 0   Ginkgo Biloba Extract 60 MG CAPS Take 60 mg by mouth daily.     glimepiride (AMARYL) 2 MG tablet Take 1 tablet (2 mg total) by mouth daily before breakfast. 90 tablet 1   glucose blood (ONETOUCH VERIO) test strip Onetouch Verio Check blood sugar twice daily 100 each 12   hydrochlorothiazide (HYDRODIURIL) 25 MG tablet Take 1 tablet (25 mg total) by mouth daily. (Patient taking differently: Take 25 mg by mouth daily as needed. ) 30 tablet 2   HYDROcodone-acetaminophen (NORCO) 5-325 MG tablet Take 1 tablet by mouth every 6 (six) hours as needed for moderate pain. 30 tablet 0   levocetirizine (XYZAL) 5 MG tablet Take 1 tablet (5 mg total) by mouth every evening. 30 tablet 5   levothyroxine (SYNTHROID) 100 MCG tablet Take 1 tablet (100 mcg total) by mouth daily. 30 tablet 6   losartan (COZAAR) 25 MG tablet Take 1 tablet by mouth once daily 90 tablet 1   metFORMIN (GLUCOPHAGE XR) 500 MG 24 hr tablet Take 2 tablets by mouth daily 60 tablet 2   Omega-3 Fatty Acids (FISH OIL) 1200 MG CAPS Take 1,200 mg by mouth daily.     ondansetron (ZOFRAN ODT) 8 MG disintegrating tablet 52m ODT q8 hours prn nausea 10 tablet 0   ondansetron (ZOFRAN) 4 MG tablet Take 1 tablet (4 mg total) by mouth every 8 (eight) hours as needed for nausea or vomiting. 20 tablet 0   OneTouch Delica Lancets 320FMISC 1 Device by Does not apply route 2 (two) times daily as needed. 60 each 5   pantoprazole (PROTONIX) 40 MG tablet Take 1 tablet (40 mg total) by mouth daily. 90 tablet 3   rosuvastatin (CRESTOR) 20 MG tablet Take 1 tablet (20 mg total) by mouth daily. 90 tablet 1    Semaglutide (RYBELSUS) 3 MG TABS Take 1 tablet by mouth daily. 30 tablet 2   sitaGLIPtin-metformin (JANUMET) 50-1000 MG tablet Take 2 tablets by mouth daily with breakfast. 60 tablet 2   tamsulosin (FLOMAX) 0.4 MG CAPS capsule Take 1 capsule (0.4 mg total) by mouth daily. 30 capsule 0   No current facility-administered medications on file prior to visit.      Objective:  Objective  Physical Exam Vitals signs and nursing note reviewed.  Constitutional:      Appearance: She is well-developed.  HENT:     Head: Normocephalic and atraumatic.  Eyes:     Conjunctiva/sclera: Conjunctivae normal.  Neck:  Musculoskeletal: Normal range of motion and neck supple.     Thyroid: No thyromegaly.     Vascular: No carotid bruit or JVD.  Cardiovascular:     Rate and Rhythm: Normal rate and regular rhythm.     Heart sounds: Normal heart sounds. No murmur.  Pulmonary:     Effort: Pulmonary effort is normal. No respiratory distress.     Breath sounds: Normal breath sounds. No wheezing or rales.  Chest:     Chest wall: No tenderness.  Neurological:     Mental Status: She is alert and oriented to person, place, and time.    BP 120/84 (BP Location: Left Arm, Patient Position: Sitting, Cuff Size: Normal)    Pulse 96    Temp 97.7 F (36.5 C) (Temporal)    Resp 18    Ht 5' 6" (1.676 m)    Wt 223 lb 9.6 oz (101.4 kg)    SpO2 97%    BMI 36.09 kg/m  Wt Readings from Last 3 Encounters:  04/20/19 223 lb 9.6 oz (101.4 kg)  04/03/19 220 lb (99.8 kg)  04/02/19 220 lb (99.8 kg)     Lab Results  Component Value Date   WBC 15.2 (H) 04/02/2019   HGB 12.6 04/02/2019   HCT 38.3 04/02/2019   PLT 260 04/02/2019   GLUCOSE 157 (H) 04/03/2019   CHOL 135 02/23/2019   TRIG 166.0 (H) 02/23/2019   HDL 39.50 02/23/2019   LDLCALC 62 02/23/2019   ALT 26 04/02/2019   AST 29 04/02/2019   NA 138 04/03/2019   K 3.3 (L) 04/03/2019   CL 100 04/03/2019   CREATININE 0.79 04/03/2019   BUN 11 04/03/2019   CO2  24 04/03/2019   TSH 0.48 02/23/2019   HGBA1C 7.9 (H) 02/23/2019   MICROALBUR 2.5 (H) 04/04/2017   ekg-- sinus, nonspec t abnormality Ct Renal Stone Study  Result Date: 04/03/2019 CLINICAL DATA:  Lithotripsy yesterday, right flank pain and vomiting EXAM: CT ABDOMEN AND PELVIS WITHOUT CONTRAST TECHNIQUE: Multidetector CT imaging of the abdomen and pelvis was performed following the standard protocol without IV contrast. COMPARISON:  March 24, 2019 FINDINGS: Lower chest: The visualized heart size within normal limits. No pericardial fluid/thickening. No hiatal hernia. Bibasilar streaky atelectasis is seen. Hepatobiliary: Although limited due to the lack of intravenous contrast, normal in appearance without gross focal abnormality. No evidence of calcified gallstones or biliary ductal dilatation. Pancreas:  Unremarkable.  No surrounding inflammatory changes. Spleen: Normal in size. Although limited due to the lack of intravenous contrast, normal in appearance. Adrenals/Urinary Tract: Both adrenal glands appear normal. Again noted is multiple calcifications within the lower pole of the right kidney the largest cluster in the lower measuring 1.1 cm. There is new enlargement of the right kidney with perinephric stranding moderate pelvicaliectasis and ureterectasis with ureteral stranding down to the level of the posterior right bladder were there is a 1 cm linear cluster of calcifications in the posterior bladder. The left kidney is unremarkable. No left-sided renal or collecting system calculi. Stomach/Bowel: The stomach, small bowel, and colon are normal in appearance. No inflammatory changes or obstructive findings. appendix is normal. Vascular/Lymphatic: There are no enlarged abdominal or pelvic lymph nodes. No significant gross vascular findings are present. Reproductive: The uterus and adnexa are unremarkable. Coarse calcifications seen within the fundal uterine fibroids. Other: No evidence of abdominal  wall mass or hernia. Musculoskeletal: No acute or significant osseous findings. IMPRESSION: 1. New 1 cm linear cluster calcifications the posterior right  bladder causing moderate right hydronephrosis and ureterectasis with significant perinephric and periureteral stranding. 2. multiple calcifications in the lower pole of the right kidney the largest cluster measuring 1.1 cm. Electronically Signed   By: Prudencio Pair M.D.   On: 04/03/2019 03:35     Assessment & Plan:  Plan  I am having Renne B. Warf maintain her acetaminophen, vitamin B-12, Ginkgo Biloba Extract, (Flaxseed, Linseed, (FLAXSEED OIL PO)), Fish Oil, Vitamin D3, aspirin EC, levothyroxine, blood glucose meter kit and supplies, fluticasone, ALPRAZolam, sitaGLIPtin-metformin, citalopram, rosuvastatin, pantoprazole, hydrochlorothiazide, gabapentin, losartan, glimepiride, fluticasone, levocetirizine, metFORMIN, Rybelsus, ondansetron, OneTouch Delica Lancets 52W, OneTouch Verio, diazepam, HYDROcodone-acetaminophen, tamsulosin, ondansetron, Diclofenac Sodium CR, and doxycycline.  No orders of the defined types were placed in this encounter.   Problem List Items Addressed This Visit      Unprioritized   Chest pain - Primary   Relevant Orders   EKG 12-Lead (Completed)   CBC with Differential/Platelet   Comprehensive metabolic panel   D-Dimer, Quantitative   ECHOCARDIOGRAM COMPLETE   Troponin I   DG Chest 2 View    pt agreed to go to ER if cp returns   Follow-up: Return if symptoms worsen or fail to improve.  [name], DO

## 2019-04-21 ENCOUNTER — Other Ambulatory Visit: Payer: Self-pay | Admitting: Family Medicine

## 2019-04-21 ENCOUNTER — Encounter (HOSPITAL_BASED_OUTPATIENT_CLINIC_OR_DEPARTMENT_OTHER): Payer: Self-pay

## 2019-04-21 ENCOUNTER — Ambulatory Visit (HOSPITAL_BASED_OUTPATIENT_CLINIC_OR_DEPARTMENT_OTHER)
Admission: RE | Admit: 2019-04-21 | Discharge: 2019-04-21 | Disposition: A | Discharge status: Home / Self Care | Payer: BLUE CROSS/BLUE SHIELD | Source: Ambulatory Visit | Attending: Family Medicine | Admitting: Family Medicine

## 2019-04-21 DIAGNOSIS — R079 Chest pain, unspecified: Secondary | ICD-10-CM | POA: Insufficient documentation

## 2019-04-21 DIAGNOSIS — R0602 Shortness of breath: Secondary | ICD-10-CM | POA: Diagnosis not present

## 2019-04-21 LAB — COMPREHENSIVE METABOLIC PANEL
ALT: 16 U/L (ref 0–35)
AST: 16 U/L (ref 0–37)
Albumin: 4.3 g/dL (ref 3.5–5.2)
Alkaline Phosphatase: 40 U/L (ref 39–117)
BUN: 13 mg/dL (ref 6–23)
CO2: 30 mEq/L (ref 19–32)
Calcium: 9.2 mg/dL (ref 8.4–10.5)
Chloride: 105 mEq/L (ref 96–112)
Creatinine, Ser: 0.61 mg/dL (ref 0.40–1.20)
GFR: 123.43 mL/min (ref >60.00)
Glucose, Bld: 252 mg/dL — ABNORMAL HIGH (ref 70–99)
Potassium: 3.8 mEq/L (ref 3.5–5.1)
Sodium: 143 mEq/L (ref 135–145)
Total Bilirubin: 0.4 mg/dL (ref 0.2–1.2)
Total Protein: 6.9 g/dL (ref 6.0–8.3)

## 2019-04-21 LAB — CBC WITH DIFFERENTIAL/PLATELET
Basophils Absolute: 0.1 10*3/uL (ref 0.0–0.1)
Basophils Relative: 0.7 % (ref 0.0–3.0)
Eosinophils Absolute: 0.1 10*3/uL (ref 0.0–0.7)
Eosinophils Relative: 1.5 % (ref 0.0–5.0)
HCT: 36.5 % (ref 36.0–46.0)
Hemoglobin: 11.8 g/dL — ABNORMAL LOW (ref 12.0–15.0)
Lymphocytes Relative: 36.3 % (ref 12.0–46.0)
Lymphs Abs: 3.3 10*3/uL (ref 0.7–4.0)
MCHC: 32.4 g/dL (ref 30.0–36.0)
MCV: 86.5 fl (ref 78.0–100.0)
Monocytes Absolute: 0.5 10*3/uL (ref 0.1–1.0)
Monocytes Relative: 6 % (ref 3.0–12.0)
Neutro Abs: 5.1 10*3/uL (ref 1.4–7.7)
Neutrophils Relative %: 55.5 % (ref 43.0–77.0)
Platelets: 324 10*3/uL (ref 150.0–400.0)
RBC: 4.22 Mil/uL (ref 3.87–5.11)
RDW: 14 % (ref 11.5–15.5)
WBC: 9.1 10*3/uL (ref 4.0–10.5)

## 2019-04-21 MED ORDER — IOHEXOL 350 MG/ML SOLN
100.0000 mL | Freq: Once | INTRAVENOUS | Status: AC | PRN
  Administered 2019-04-21: 100 mL via INTRAVENOUS

## 2019-04-22 ENCOUNTER — Other Ambulatory Visit: Payer: Self-pay | Admitting: Family Medicine

## 2019-04-22 ENCOUNTER — Telehealth: Payer: Self-pay

## 2019-04-22 DIAGNOSIS — Z20828 Contact with and (suspected) exposure to other viral communicable diseases: Secondary | ICD-10-CM | POA: Diagnosis not present

## 2019-04-22 DIAGNOSIS — U071 COVID-19: Secondary | ICD-10-CM | POA: Diagnosis not present

## 2019-04-22 DIAGNOSIS — R079 Chest pain, unspecified: Secondary | ICD-10-CM

## 2019-04-22 NOTE — Telephone Encounter (Signed)
I put in a cardiology referral in but I agree with er if chest pain worsens

## 2019-04-22 NOTE — Telephone Encounter (Signed)
Spoke with patient and she states chest pain is the same and still having SOB. Pt states having to take deeps breaths while talking. Pt states chest pain still comes and goes. Pt states symptoms are not worse. Pt scheduled for a virtual tomorrow. Advised patient that if symptoms worsen to go to the ED.

## 2019-04-23 ENCOUNTER — Encounter: Payer: BLUE CROSS/BLUE SHIELD | Admitting: Family Medicine

## 2019-04-23 ENCOUNTER — Other Ambulatory Visit: Payer: Self-pay

## 2019-04-23 NOTE — Telephone Encounter (Signed)
Left detailed message on machine.    Did not see appointment scheduled for tomorrow.   I advised on machine that if she needs to be seen to call back to schedule and also that the referral has been placed to cardiology

## 2019-04-27 ENCOUNTER — Ambulatory Visit: Payer: BLUE CROSS/BLUE SHIELD | Attending: Obstetrics & Gynecology | Admitting: Physical Therapy

## 2019-04-30 ENCOUNTER — Encounter: Payer: Self-pay | Admitting: Cardiology

## 2019-04-30 ENCOUNTER — Other Ambulatory Visit: Payer: Self-pay

## 2019-04-30 ENCOUNTER — Ambulatory Visit (INDEPENDENT_AMBULATORY_CARE_PROVIDER_SITE_OTHER): Payer: BLUE CROSS/BLUE SHIELD | Admitting: Cardiology

## 2019-04-30 VITALS — BP 120/70 | HR 88 | Ht 66.0 in | Wt 222.0 lb

## 2019-04-30 DIAGNOSIS — U071 COVID-19: Secondary | ICD-10-CM | POA: Diagnosis not present

## 2019-04-30 DIAGNOSIS — E1165 Type 2 diabetes mellitus with hyperglycemia: Secondary | ICD-10-CM | POA: Diagnosis not present

## 2019-04-30 DIAGNOSIS — I10 Essential (primary) hypertension: Secondary | ICD-10-CM

## 2019-04-30 DIAGNOSIS — E1151 Type 2 diabetes mellitus with diabetic peripheral angiopathy without gangrene: Secondary | ICD-10-CM

## 2019-04-30 DIAGNOSIS — R0789 Other chest pain: Secondary | ICD-10-CM | POA: Diagnosis not present

## 2019-04-30 DIAGNOSIS — IMO0002 Reserved for concepts with insufficient information to code with codable children: Secondary | ICD-10-CM

## 2019-04-30 DIAGNOSIS — Z20828 Contact with and (suspected) exposure to other viral communicable diseases: Secondary | ICD-10-CM | POA: Diagnosis not present

## 2019-04-30 NOTE — Progress Notes (Signed)
Cardiology Consultation:    Date:  04/30/2019   ID:  Felicia Solis, DOB February 14, 1966, MRN [id number]  PCP:  Carollee Herter, Alferd Apa, DO  Cardiologist:  Jenne Campus, MD   Referring MD: Carollee Herter, Alferd Apa, *   Chief Complaint  Patient presents with  . Chest Pain    about a month  . Shortness of Breath    random    History of Present Illness:    Felicia Solis is a 53 y.o. female who is being seen today for the evaluation of of chest pain and shortness of breath at the request of [name], *.  Past medical history significant for dyslipidemia, diabetes which is poorly controlled.  She was referred to [place] because of chest pain.  She described pain as sharp and stabbing lasting only for split-second not related to exercise.  That happens about 3-4 times a week interestingly she had previously few stress test done for the same sensation and stress test was negative.  She is busy working and have no difficulty doing it she is able to walk climb stairs with no sensation of chest pain.  She does get fatigue and shortness of breath while walking.  Denies having any swelling of lower extremities no palpitations no dizziness.  Past Medical History:  Diagnosis Date  . Anemia    anemia  when pregnant in 2002  . Anxiety   . Arthritis    "knees and back" (09/28/2016)  . Asthma 5 yrs ago    told she had asthma.. does not remember doctors name.   . Chronic lower back pain   . Depression    takes citalopram  . History of hiatal hernia   . History of kidney stones   . Hyperlipidemia   . Hypothyroidism   . LBBB (left bundle branch block)    rate-related; identified on stress test 03/21/16  . Migraine    "weekly" (09/28/2016)  . 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 Shortness of breath    with exertion  . Thyroid disease   . Type II diabetes mellitus ([place]) dx'd 2016    Past Surgical History:  Procedure Laterality Date  .  DILATION AND CURETTAGE OF UTERUS  1999  . ENDOMETRIAL ABLATION    . EXTRACORPOREAL SHOCK WAVE LITHOTRIPSY Right 04/02/2019   Procedure: EXTRACORPOREAL SHOCK WAVE LITHOTRIPSY (ESWL);  Surgeon: Ceasar Mons, MD;  Location: [facility];  Service: Urology;  Laterality: Right;  . LAPAROSCOPY     years ago ..pt does not know where surgery was done ... states" my stomach would blow up then go down"  . THYROIDECTOMY  09/28/2011   Procedure: THYROIDECTOMY;  Surgeon: Izora Gala, MD;  Location: [facility];  Service: ENT;  Laterality: Right;  RIGHT THYROIDECTOMY WITH FROZEN SECTION  . THYROIDECTOMY  09/28/2016   completion of thyroidectomy/notes 09/28/2016  . THYROIDECTOMY Left 09/28/2016   Procedure: COMPLETION OF THYROIDECTOMY;  Surgeon: Izora Gala, MD;  Location: [facility];  Service: ENT;  Laterality: Left;  . TUBAL LIGATION      Current Medications: Current Meds  Medication Sig  . acetaminophen (TYLENOL) 500 MG tablet Take 1,000 mg by mouth every 6 (six) hours as needed for mild pain.  Marland Kitchen ALPRAZolam (XANAX) 0.5 MG tablet Take 1 tablet (0.5 mg total) by mouth 3 (three) times daily as needed for anxiety.  Marland Kitchen aspirin EC 81 MG tablet  Take 1 tablet (81 mg total) by mouth daily.  . blood glucose meter kit and supplies KIT Dispense based on patient and insurance preference. Use up to four times daily as directed. (FOR ICD-9 250.00, 250.01).  . Cholecalciferol (VITAMIN D3) 2000 units TABS Take 2,000 Units by mouth daily.   . citalopram (CELEXA) 20 MG tablet Take 1 tablet (20 mg total) by mouth daily.  . cyanocobalamin 500 MCG tablet Take 1,000 mcg by mouth daily.   . Flaxseed, Linseed, (FLAXSEED OIL PO) Take 1,300 mg by mouth daily.  . fluticasone (FLONASE) 50 MCG/ACT nasal spray Place 2 sprays into both nostrils daily.  . Ginkgo Biloba Extract 60 MG CAPS Take 60 mg by mouth daily.  Marland Kitchen glimepiride (AMARYL) 2 MG tablet Take 1 tablet (2 mg total) by mouth daily before breakfast.  . glucose blood (ONETOUCH VERIO)  test strip Onetouch Verio Check blood sugar twice daily  . hydrochlorothiazide (HYDRODIURIL) 25 MG tablet Take 1 tablet (25 mg total) by mouth daily. (Patient taking differently: Take 25 mg by mouth daily as needed. )  . HYDROcodone-acetaminophen (NORCO) 5-325 MG tablet Take 1 tablet by mouth every 6 (six) hours as needed for moderate pain.  Marland Kitchen levocetirizine (XYZAL) 5 MG tablet Take 1 tablet (5 mg total) by mouth every evening.  Marland Kitchen levothyroxine (SYNTHROID) 100 MCG tablet Take 1 tablet (100 mcg total) by mouth daily.  Marland Kitchen losartan (COZAAR) 25 MG tablet Take 1 tablet by mouth once daily  . Omega-3 Fatty Acids (FISH OIL) 1200 MG CAPS Take 1,200 mg by mouth daily.  Glory Rosebush Delica Lancets 70J MISC 1 Device by Does not apply route 2 (two) times daily as needed.  . pantoprazole (PROTONIX) 40 MG tablet Take 1 tablet (40 mg total) by mouth daily.  . rosuvastatin (CRESTOR) 20 MG tablet Take 1 tablet (20 mg total) by mouth daily.  . Semaglutide (RYBELSUS) 3 MG TABS Take 1 tablet by mouth daily.     Allergies:   Lisinopril   Social History   Socioeconomic History  . Marital status: Married    Spouse name: Not on file  . Number of children: 3  . Years of education: Not on file  . Highest education level: Not on file  Occupational History  . Occupation: CNA  Social Needs  . Financial resource strain: Not on file  . Food insecurity    Worry: Not on file    Inability: Not on file  . Transportation needs    Medical: Not on file    Non-medical: Not on file  Tobacco Use  . Smoking status: Never Smoker  . Smokeless tobacco: Never Used  Substance and Sexual Activity  . Alcohol use: No    Alcohol/week: 0.0 standard drinks  . Drug use: No  . Sexual activity: Not on file  Lifestyle  . Physical activity    Days per week: Not on file    Minutes per session: Not on file  . Stress: Not on file  Relationships  . Social Herbalist on phone: Not on file    Gets together: Not on file     Attends religious service: Not on file    Active member of club or organization: Not on file    Attends meetings of clubs or organizations: Not on file    Relationship status: Not on file  Other Topics Concern  . Not on file  Social History Narrative   No exercise--  Walking and lifting pts  at work     Family History: The patient's family history includes Breast cancer in her maternal aunt; Breast cancer (age of onset: 37) in her maternal grandmother; Colon cancer in her paternal grandfather; Colon polyps in her father; Diabetes in her brother, mother, and sister; Gallbladder disease in her maternal grandmother and mother; Heart attack in her brother; Heart disease in her mother and another family member; Hyperlipidemia in her father and mother; Hypertension in her father and mother; Ovarian cancer in her maternal grandmother. There is no history of Anesthesia problems, Hypotension, Malignant hyperthermia, Pseudochol deficiency, or Esophageal cancer. ROS:   Please see the history of present illness.    All 14 point review of systems negative except as described per history of present illness.  EKGs/Labs/Other Studies Reviewed:    The following studies were reviewed today:   EKG:  EKG is  ordered today.  The ekg ordered today demonstrates EKG done by primary care physician showed normal sinus rhythm, normal P interval, nonspecific ST segment changes.  Interestingly doing stress test before she developed rate related bundle branch block  Recent Labs: 08/16/2018: Magnesium 1.7 02/23/2019: TSH 0.48 04/20/2019: ALT 16; BUN 13; Creatinine, Ser 0.61; Hemoglobin 11.8; Platelets 324.0; Potassium 3.8; Sodium 143  Recent Lipid Panel    Component Value Date/Time   CHOL 135 02/23/2019 1552   TRIG 166.0 (H) 02/23/2019 1552   HDL 39.50 02/23/2019 1552   CHOLHDL 3 02/23/2019 1552   VLDL 33.2 02/23/2019 1552   LDLCALC 62 02/23/2019 1552    Physical Exam:    VS:  BP 120/70   Pulse 88   Ht '5\' 6"'$   (1.676 m)   Wt 222 lb (100.7 kg)   SpO2 97%   BMI 35.83 kg/m     Wt Readings from Last 3 Encounters:  04/30/19 222 lb (100.7 kg)  04/20/19 223 lb 9.6 oz (101.4 kg)  04/03/19 220 lb (99.8 kg)     GEN:  Well nourished, well developed in no acute distress HEENT: Normal NECK: No JVD; No carotid bruits LYMPHATICS: No lymphadenopathy CARDIAC: RRR, no murmurs, no rubs, no gallops RESPIRATORY:  Clear to auscultation without rales, wheezing or rhonchi  ABDOMEN: Soft, non-tender, non-distended MUSCULOSKELETAL:  No edema; No deformity  SKIN: Warm and dry NEUROLOGIC:  Alert and oriented x 3 PSYCHIATRIC:  Normal affect   ASSESSMENT:    1. Essential hypertension   2. DM (diabetes mellitus) type II uncontrolled, periph vascular disorder ([place])   3. Atypical chest pain    PLAN:    In order of problems listed above:  1. Atypical chest pain I have overall low level suspicion that this is coronary disease guessing on characteristic of the pain.  Also she gets stress test done before for the same kind of pain.  I think the best approach for this situation will be to do calcium score trying to determine what is her prognosis and based on that manage her risk factors appropriately.  Obviously if her calcium score will be very high then will go either to cardiac catheterization or stress testing. 2. Diabetes mellitus followed by internal medicine team last hemoglobin A1c 7.8.  She understand that she need to get better control her diabetes she admits that she did not do drinks a lot of soda. 3. Essential hypertension blood pressure well controlled 4. Dyslipidemia fasting lipid profile acceptable.   Medication Adjustments/Labs and Tests Ordered: Current medicines are reviewed at length with the patient today.  Concerns regarding medicines are outlined  above.  No orders of the defined types were placed in this encounter.  No orders of the defined types were placed in this encounter.   Signed,  Park Liter, MD, [facility]. 04/30/2019 3:37 PM    [facility]

## 2019-04-30 NOTE — Patient Instructions (Signed)
Medication Instructions:  Your physician recommends that you continue on your current medications as directed. Please refer to the Current Medication list given to you today.  *If you need a refill on your cardiac medications before your next appointment, please call your pharmacy*  Lab Work: None.  If you have labs (blood work) drawn today and your tests are completely normal, you will receive your results only by: Marland Kitchen MyChart Message (if you have MyChart) OR . A paper copy in the mail If you have any lab test that is abnormal or we need to change your treatment, we will call you to review the results.  Testing/Procedures: Non-Cardiac CT scanning, (CAT scanning), is a noninvasive, special x-ray that produces cross-sectional images of the body using x-rays and a computer. CT scans help physicians diagnose and treat medical conditions. For some CT exams, a contrast material is used to enhance visibility in the area of the body being studied. CT scans provide greater clarity and reveal more details than regular x-ray exams.    Follow-Up: At [facility], you and your health needs are our priority.  As part of our continuing mission to provide you with exceptional heart care, we have created designated Provider Care Teams.  These Care Teams include your primary Cardiologist (physician) and Advanced Practice Providers (APPs -  Physician Assistants and Nurse Practitioners) who all work together to provide you with the care you need, when you need it.  Your next appointment:   3 months  The format for your next appointment:   In Person  Provider:   You may see Dr. Agustin Cree or the following Advanced Practice Provider on your designated Care Team:    Laurann Montana, FNP   Other Instructions   Coronary Calcium Scan A coronary calcium scan is an imaging test used to look for deposits of calcium and other fatty materials (plaques) in the inner lining of the blood vessels of the heart  (coronary arteries). These deposits of calcium and plaques can partly clog and narrow the coronary arteries without producing any symptoms or warning signs. This puts a person at risk for a heart attack. This test can detect these deposits before symptoms develop. Tell a health care provider about:  Any allergies you have.  All medicines you are taking, including vitamins, herbs, eye drops, creams, and over-the-counter medicines.  Any problems you or family members have had with anesthetic medicines.  Any blood disorders you have.  Any surgeries you have had.  Any medical conditions you have.  Whether you are pregnant or may be pregnant. What are the risks? Generally, this is a safe procedure. However, problems may occur, including:  Harm to a pregnant woman and her unborn baby. This test involves the use of radiation. Radiation exposure can be dangerous to a pregnant woman and her unborn baby. If you are pregnant, you generally should not have this procedure done.  Slight increase in the risk of cancer. This is because of the radiation involved in the test. What happens before the procedure? No preparation is needed for this procedure. What happens during the procedure?   You will undress and remove any jewelry around your neck or chest.  You will put on a hospital gown.  Sticky electrodes will be placed on your chest. The electrodes will be connected to an electrocardiogram (ECG) machine to record a tracing of the electrical activity of your heart.  A CT scanner will take pictures of your heart. During this time, you will  be asked to lie still and hold your breath for 2-3 seconds while a picture of your heart is being taken. The procedure may vary among health care providers and hospitals. What happens after the procedure?  You can get dressed.  You can return to your normal activities.  It is up to you to get the results of your test. Ask your health care provider, or the  department that is doing the test, when your results will be ready. Summary  A coronary calcium scan is an imaging test used to look for deposits of calcium and other fatty materials (plaques) in the inner lining of the blood vessels of the heart (coronary arteries).  Generally, this is a safe procedure. Tell your health care provider if you are pregnant or may be pregnant.  No preparation is needed for this procedure.  A CT scanner will take pictures of your heart.  You can return to your normal activities after the scan is done. This information is not intended to replace advice given to you by your health care provider. Make sure you discuss any questions you have with your health care provider. Document Released: 12/08/2007 Document Revised: 05/24/2017 Document Reviewed: 04/30/2016 Elsevier Patient Education  2020 [employer].

## 2019-04-30 NOTE — Addendum Note (Signed)
Addended by: Ashok Norris on: 04/30/2019 03:48 PM   Modules accepted: Orders

## 2019-05-05 DIAGNOSIS — H40013 Open angle with borderline findings, low risk, bilateral: Secondary | ICD-10-CM | POA: Diagnosis not present

## 2019-05-05 DIAGNOSIS — E119 Type 2 diabetes mellitus without complications: Secondary | ICD-10-CM | POA: Diagnosis not present

## 2019-05-06 DIAGNOSIS — Z20828 Contact with and (suspected) exposure to other viral communicable diseases: Secondary | ICD-10-CM | POA: Diagnosis not present

## 2019-05-06 DIAGNOSIS — U071 COVID-19: Secondary | ICD-10-CM | POA: Diagnosis not present

## 2019-05-12 ENCOUNTER — Ambulatory Visit (INDEPENDENT_AMBULATORY_CARE_PROVIDER_SITE_OTHER)
Admission: RE | Admit: 2019-05-12 | Discharge: 2019-05-12 | Disposition: A | Discharge status: Home / Self Care | Payer: Self-pay | Source: Ambulatory Visit | Attending: Cardiology | Admitting: Cardiology

## 2019-05-12 ENCOUNTER — Other Ambulatory Visit: Payer: Self-pay

## 2019-05-12 DIAGNOSIS — R0789 Other chest pain: Secondary | ICD-10-CM

## 2019-05-13 ENCOUNTER — Ambulatory Visit: Payer: BLUE CROSS/BLUE SHIELD | Admitting: Obstetrics & Gynecology

## 2019-05-13 DIAGNOSIS — Z20828 Contact with and (suspected) exposure to other viral communicable diseases: Secondary | ICD-10-CM | POA: Diagnosis not present

## 2019-05-13 DIAGNOSIS — U071 COVID-19: Secondary | ICD-10-CM | POA: Diagnosis not present

## 2019-05-13 DIAGNOSIS — Z09 Encounter for follow-up examination after completed treatment for conditions other than malignant neoplasm: Secondary | ICD-10-CM

## 2019-05-19 ENCOUNTER — Telehealth: Payer: Self-pay | Admitting: Emergency Medicine

## 2019-05-19 NOTE — Telephone Encounter (Signed)
Left message for patient to return call regarding results  

## 2019-05-20 DIAGNOSIS — Z20828 Contact with and (suspected) exposure to other viral communicable diseases: Secondary | ICD-10-CM | POA: Diagnosis not present

## 2019-05-20 DIAGNOSIS — U071 COVID-19: Secondary | ICD-10-CM | POA: Diagnosis not present

## 2019-05-25 ENCOUNTER — Other Ambulatory Visit: Payer: Self-pay

## 2019-05-25 ENCOUNTER — Encounter: Payer: Self-pay | Admitting: Family Medicine

## 2019-05-25 ENCOUNTER — Ambulatory Visit (INDEPENDENT_AMBULATORY_CARE_PROVIDER_SITE_OTHER): Payer: BLUE CROSS/BLUE SHIELD | Admitting: Family Medicine

## 2019-05-25 VITALS — Temp 96.8°F

## 2019-05-25 DIAGNOSIS — E039 Hypothyroidism, unspecified: Secondary | ICD-10-CM

## 2019-05-25 DIAGNOSIS — E785 Hyperlipidemia, unspecified: Secondary | ICD-10-CM

## 2019-05-25 DIAGNOSIS — E1165 Type 2 diabetes mellitus with hyperglycemia: Secondary | ICD-10-CM

## 2019-05-25 DIAGNOSIS — E1169 Type 2 diabetes mellitus with other specified complication: Secondary | ICD-10-CM

## 2019-05-25 DIAGNOSIS — I10 Essential (primary) hypertension: Secondary | ICD-10-CM | POA: Diagnosis not present

## 2019-05-25 MED ORDER — OZEMPIC (0.25 OR 0.5 MG/DOSE) 2 MG/1.5ML ~~LOC~~ SOPN: 0.2500 mg | PEN_INJECTOR | SUBCUTANEOUS | 5 refills | Status: DC

## 2019-05-25 NOTE — Progress Notes (Signed)
Virtual Visit via Video Note  I connected with Felicia Solis on 05/25/19 at  3:40 PM EST by a video enabled telemedicine application and verified that I am speaking with the correct person using two identifiers.  Location: Patient: home alone Provider: office    I discussed the limitations of evaluation and management by telemedicine and the availability of in person appointments. The patient expressed understanding and agreed to proceed.  History of Present Illness: Pt is home --- we had to transition to phone visit due to pt having trouble with her connection HYPERTENSION   Blood pressure range-not checking   Chest pain- no      Dyspnea- no Lightheadedness- no   Edema- no  Other side effects - no   Medication compliance: good Low salt diet- yes     DIABETES    Blood Sugar ranges-not checking   Polyuria- no New Visual problems- no  Hypoglycemic symptoms- no  Other side effects-no Medication compliance - poor Last eye exam- this month    HYPERLIPIDEMIA  Medication compliance- good RUQ pain- no  Muscle aches- no Other side effects-no       Observations/Objective: Vitals:   05/25/19 1413  Temp: (!) 96.8 F (36 C)  no other vitals obtained Pt in NAD Assessment and Plan: 1. Uncontrolled type 2 diabetes mellitus with hyperglycemia (HCC) Check labs  Try victoza  con't amaryl  - Hemoglobin A1c; Future - Comprehensive metabolic panel; Future - Semaglutide,0.25 or 0.5MG /DOS, (OZEMPIC, 0.25 OR 0.5 MG/DOSE,) 2 MG/1.5ML SOPN; Inject 0.25 mg into the skin once a week.  Dispense: 1.5 mL; Refill: 5  2. Hyperlipidemia associated with type 2 diabetes mellitus ([place]) Tolerating statin, encouraged heart healthy diet, avoid trans fats, minimize simple carbs and saturated fats. Increase exercise as tolerated - Lipid panel; Future - Comprehensive metabolic panel; Future  3. Essential hypertension Well controlled, no changes to meds. Encouraged heart healthy diet such as the  DASH diet and exercise as tolerated.  - Lipid panel; Future - Comprehensive metabolic panel; Future   Follow Up Instructions:    I discussed the assessment and treatment plan with the patient. The patient was provided an opportunity to ask questions and all were answered. The patient agreed with the plan and demonstrated an understanding of the instructions.   The patient was advised to call back or seek an in-person evaluation if the symptoms worsen or if the condition fails to improve as anticipated.  I provided 15 minutes of non-face-to-face time during this encounter.   [name], DO

## 2019-05-25 NOTE — Patient Instructions (Signed)
Carbohydrate Counting for Diabetes Mellitus, Adult  Carbohydrate counting is a method of keeping track of how many carbohydrates you eat. Eating carbohydrates naturally increases the amount of sugar (glucose) in the blood. Counting how many carbohydrates you eat helps keep your blood glucose within normal limits, which helps you manage your diabetes (diabetes mellitus). It is important to know how many carbohydrates you can safely have in each meal. This is different for every person. A diet and nutrition specialist (registered dietitian) can help you make a meal plan and calculate how many carbohydrates you should have at each meal and snack. Carbohydrates are found in the following foods:  Grains, such as breads and cereals.  Dried beans and soy products.  Starchy vegetables, such as potatoes, peas, and corn.  Fruit and fruit juices.  Milk and yogurt.  Sweets and snack foods, such as cake, cookies, candy, chips, and soft drinks. How do I count carbohydrates? There are two ways to count carbohydrates in food. You can use either of the methods or a combination of both. Reading "Nutrition Facts" on packaged food The "Nutrition Facts" list is included on the labels of almost all packaged foods and beverages in the [place] It includes:  The serving size.  Information about nutrients in each serving, including the grams (g) of carbohydrate per serving. To use the "Nutrition Facts":  Decide how many servings you will have.  Multiply the number of servings by the number of carbohydrates per serving.  The resulting number is the total amount of carbohydrates that you will be having. Learning standard serving sizes of other foods When you eat carbohydrate foods that are not packaged or do not include "Nutrition Facts" on the label, you need to measure the servings in order to count the amount of carbohydrates:  Measure the foods that you will eat with a food scale or measuring cup, if needed.   Decide how many standard-size servings you will eat.  Multiply the number of servings by 15. Most carbohydrate-rich foods have about 15 g of carbohydrates per serving. ? For example, if you eat 8 oz (170 g) of strawberries, you will have eaten 2 servings and 30 g of carbohydrates (2 servings x 15 g = 30 g).  For foods that have more than one food mixed, such as soups and casseroles, you must count the carbohydrates in each food that is included. The following list contains standard serving sizes of common carbohydrate-rich foods. Each of these servings has about 15 g of carbohydrates:   hamburger bun or  English muffin.   oz (15 mL) syrup.   oz (14 g) jelly.  1 slice of bread.  1 six-inch tortilla.  3 oz (85 g) cooked rice or pasta.  4 oz (113 g) cooked dried beans.  4 oz (113 g) starchy vegetable, such as peas, corn, or potatoes.  4 oz (113 g) hot cereal.  4 oz (113 g) mashed potatoes or  of a large baked potato.  4 oz (113 g) canned or frozen fruit.  4 oz (120 mL) fruit juice.  4-6 crackers.  6 chicken nuggets.  6 oz (170 g) unsweetened dry cereal.  6 oz (170 g) plain fat-free yogurt or yogurt sweetened with artificial sweeteners.  8 oz (240 mL) milk.  8 oz (170 g) fresh fruit or one small piece of fruit.  24 oz (680 g) popped popcorn. Example of carbohydrate counting Sample meal  3 oz (85 g) chicken breast.  6 oz (170 g)   brown rice.  4 oz (113 g) corn.  8 oz (240 mL) milk.  8 oz (170 g) strawberries with sugar-free whipped topping. Carbohydrate calculation 1. Identify the foods that contain carbohydrates: ? Rice. ? Corn. ? Milk. ? Strawberries. 2. Calculate how many servings you have of each food: ? 2 servings rice. ? 1 serving corn. ? 1 serving milk. ? 1 serving strawberries. 3. Multiply each number of servings by 15 g: ? 2 servings rice x 15 g = 30 g. ? 1 serving corn x 15 g = 15 g. ? 1 serving milk x 15 g = 15 g. ? 1 serving  strawberries x 15 g = 15 g. 4. Add together all of the amounts to find the total grams of carbohydrates eaten: ? 30 g + 15 g + 15 g + 15 g = 75 g of carbohydrates total. Summary  Carbohydrate counting is a method of keeping track of how many carbohydrates you eat.  Eating carbohydrates naturally increases the amount of sugar (glucose) in the blood.  Counting how many carbohydrates you eat helps keep your blood glucose within normal limits, which helps you manage your diabetes.  A diet and nutrition specialist (registered dietitian) can help you make a meal plan and calculate how many carbohydrates you should have at each meal and snack. This information is not intended to replace advice given to you by your health care provider. Make sure you discuss any questions you have with your health care provider. Document Released: 06/11/2005 Document Revised: 01/03/2017 Document Reviewed: 11/23/2015 Elsevier Patient Education  2020 Elsevier Inc[employer]  

## 2019-05-26 ENCOUNTER — Other Ambulatory Visit (INDEPENDENT_AMBULATORY_CARE_PROVIDER_SITE_OTHER): Payer: BLUE CROSS/BLUE SHIELD

## 2019-05-26 ENCOUNTER — Other Ambulatory Visit: Payer: Self-pay

## 2019-05-26 DIAGNOSIS — E039 Hypothyroidism, unspecified: Secondary | ICD-10-CM | POA: Diagnosis not present

## 2019-05-26 DIAGNOSIS — E1169 Type 2 diabetes mellitus with other specified complication: Secondary | ICD-10-CM | POA: Diagnosis not present

## 2019-05-26 DIAGNOSIS — E785 Hyperlipidemia, unspecified: Secondary | ICD-10-CM

## 2019-05-26 DIAGNOSIS — E1165 Type 2 diabetes mellitus with hyperglycemia: Secondary | ICD-10-CM | POA: Diagnosis not present

## 2019-05-26 DIAGNOSIS — I10 Essential (primary) hypertension: Secondary | ICD-10-CM | POA: Diagnosis not present

## 2019-05-26 LAB — COMPREHENSIVE METABOLIC PANEL
ALT: 17 U/L (ref 0–35)
AST: 14 U/L (ref 0–37)
Albumin: 4.2 g/dL (ref 3.5–5.2)
Alkaline Phosphatase: 41 U/L (ref 39–117)
BUN: 10 mg/dL (ref 6–23)
CO2: 26 mEq/L (ref 19–32)
Calcium: 8.9 mg/dL (ref 8.4–10.5)
Chloride: 107 mEq/L (ref 96–112)
Creatinine, Ser: 0.47 mg/dL (ref 0.40–1.20)
GFR: 166.7 mL/min (ref >60.00)
Glucose, Bld: 113 mg/dL — ABNORMAL HIGH (ref 70–99)
Potassium: 3.7 mEq/L (ref 3.5–5.1)
Sodium: 143 mEq/L (ref 135–145)
Total Bilirubin: 0.4 mg/dL (ref 0.2–1.2)
Total Protein: 6.5 g/dL (ref 6.0–8.3)

## 2019-05-26 LAB — LIPID PANEL
Cholesterol: 100 mg/dL (ref 0–200)
HDL: 34.7 mg/dL — ABNORMAL LOW (ref >39.00)
LDL Cholesterol: 54 mg/dL (ref 0–99)
NonHDL: 65.6
Total CHOL/HDL Ratio: 3
Triglycerides: 56 mg/dL (ref 0.0–149.0)
VLDL: 11.2 mg/dL (ref 0.0–40.0)

## 2019-05-26 LAB — TSH: TSH: 0.23 u[IU]/mL — ABNORMAL LOW (ref 0.35–4.50)

## 2019-05-26 LAB — HEMOGLOBIN A1C: Hgb A1c MFr Bld: 7.3 % — ABNORMAL HIGH (ref 4.6–6.5)

## 2019-05-27 ENCOUNTER — Other Ambulatory Visit: Payer: Self-pay | Admitting: Family Medicine

## 2019-05-27 ENCOUNTER — Other Ambulatory Visit: Payer: Self-pay

## 2019-05-27 DIAGNOSIS — E039 Hypothyroidism, unspecified: Secondary | ICD-10-CM

## 2019-05-27 MED ORDER — LEVOTHYROXINE SODIUM 88 MCG PO TABS: 88.0000 ug | ORAL_TABLET | Freq: Every day | ORAL | 2 refills | Status: DC

## 2019-05-28 DIAGNOSIS — U071 COVID-19: Secondary | ICD-10-CM | POA: Diagnosis not present

## 2019-05-28 DIAGNOSIS — Z20828 Contact with and (suspected) exposure to other viral communicable diseases: Secondary | ICD-10-CM | POA: Diagnosis not present

## 2019-05-29 ENCOUNTER — Encounter: Payer: Self-pay | Admitting: Family Medicine

## 2019-05-29 ENCOUNTER — Other Ambulatory Visit: Payer: Self-pay

## 2019-05-29 ENCOUNTER — Ambulatory Visit (INDEPENDENT_AMBULATORY_CARE_PROVIDER_SITE_OTHER): Payer: BLUE CROSS/BLUE SHIELD | Admitting: Family Medicine

## 2019-05-29 DIAGNOSIS — R42 Dizziness and giddiness: Secondary | ICD-10-CM

## 2019-05-29 DIAGNOSIS — H9201 Otalgia, right ear: Secondary | ICD-10-CM | POA: Diagnosis not present

## 2019-05-29 MED ORDER — AZELASTINE HCL 0.1 % NA SOLN: 1.0000 | Freq: Two times a day (BID) | NASAL | 12 refills | Status: DC

## 2019-05-29 MED ORDER — AMOXICILLIN 875 MG PO TABS: 875.0000 mg | ORAL_TABLET | Freq: Two times a day (BID) | ORAL | 0 refills | Status: DC

## 2019-05-29 NOTE — Patient Instructions (Signed)

## 2019-05-29 NOTE — Progress Notes (Signed)
Virtual Visit via Video Note  I connected with Felicia Solis on 05/29/19 at  1:40 PM EST by a video enabled telemedicine application and verified that I am speaking with the correct person using two identifiers. in Location: Patient: in her car  Provider: office    I discussed the limitations of evaluation and management by telemedicine and the availability of in person appointments. The patient expressed understanding and agreed to proceed.  History of Present Illness: Pt c/o dizzy and funny feeling in R ear  No congestion but her ears have felt stopped up   episode lasted 10-15 min  Observations/Objective: bs 125 -- unable to check other vitals  She came in to get epley manuver instructions and I was able to look in her ears + cerumen imaction b/l and L ear canal is red   Assessment and Plan: 1. Dizziness Seasonal allergies  Cont flonase and xyzal  - azelastine (ASTELIN) 0.1 % nasal spray; Place 1 spray into both nostrils 2 (two) times daily. Use in each nostril as directed  Dispense: 30 mL; Refill: 12  2. Right ear pain Debox and rto next week for irrigation if needed - amoxicillin (AMOXIL) 875 MG tablet; Take 1 tablet (875 mg total) by mouth 2 (two) times daily.  Dispense: 20 tablet; Refill: 0   Follow Up Instructions:    I discussed the assessment and treatment plan with the patient. The patient was provided an opportunity to ask questions and all were answered. The patient agreed with the plan and demonstrated an understanding of the instructions.   The patient was advised to call back or seek an in-person evaluation if the symptoms worsen or if the condition fails to improve as anticipated.  I provided 15 minutes of non-face-to-face time during this encounter.   [name], DO

## 2019-06-01 DIAGNOSIS — U071 COVID-19: Secondary | ICD-10-CM | POA: Diagnosis not present

## 2019-06-01 DIAGNOSIS — Z20828 Contact with and (suspected) exposure to other viral communicable diseases: Secondary | ICD-10-CM | POA: Diagnosis not present

## 2019-06-08 ENCOUNTER — Other Ambulatory Visit: Payer: Self-pay

## 2019-06-08 DIAGNOSIS — U071 COVID-19: Secondary | ICD-10-CM | POA: Diagnosis not present

## 2019-06-08 DIAGNOSIS — Z20828 Contact with and (suspected) exposure to other viral communicable diseases: Secondary | ICD-10-CM | POA: Diagnosis not present

## 2019-06-08 NOTE — Progress Notes (Signed)
This encounter was created in error - please disregard.

## 2019-06-09 ENCOUNTER — Encounter: Payer: Self-pay | Admitting: Family Medicine

## 2019-06-09 ENCOUNTER — Ambulatory Visit: Payer: BLUE CROSS/BLUE SHIELD | Admitting: Family Medicine

## 2019-06-09 ENCOUNTER — Other Ambulatory Visit: Payer: Self-pay

## 2019-06-09 VITALS — BP 120/82 | HR 76 | Temp 97.6°F | Resp 18 | Ht 66.0 in | Wt 223.8 lb

## 2019-06-09 DIAGNOSIS — R42 Dizziness and giddiness: Secondary | ICD-10-CM | POA: Diagnosis not present

## 2019-06-09 DIAGNOSIS — H6501 Acute serous otitis media, right ear: Secondary | ICD-10-CM

## 2019-06-09 DIAGNOSIS — H6122 Impacted cerumen, left ear: Secondary | ICD-10-CM

## 2019-06-09 NOTE — Assessment & Plan Note (Signed)
con't astelin / flonase and antihistamine See ENT today

## 2019-06-09 NOTE — Assessment & Plan Note (Signed)
con't with prn meclizine See ENT today Consider MRI / neuro if problem con't

## 2019-06-09 NOTE — Patient Instructions (Signed)
Dizziness Dizziness is a common problem. It is a feeling of unsteadiness or light-headedness. You may feel like you are about to faint. Dizziness can lead to injury if you stumble or fall. Anyone can become dizzy, but dizziness is more common in older adults. This condition can be caused by a number of things, including medicines, dehydration, or illness. Follow these instructions at home: Eating and drinking  Drink enough fluid to keep your urine clear or pale yellow. This helps to keep you from becoming dehydrated. Try to drink more clear fluids, such as water.  Do not drink alcohol.  Limit your caffeine intake if told to do so by your health care provider. Check ingredients and nutrition facts to see if a food or beverage contains caffeine.  Limit your salt (sodium) intake if told to do so by your health care provider. Check ingredients and nutrition facts to see if a food or beverage contains sodium. Activity  Avoid making quick movements. ? Rise slowly from chairs and steady yourself until you feel okay. ? In the morning, first sit up on the side of the bed. When you feel okay, stand slowly while you hold onto something until you know that your balance is fine.  If you need to stand in one place for a long time, move your legs often. Tighten and relax the muscles in your legs while you are standing.  Do not drive or use heavy machinery if you feel dizzy.  Avoid bending down if you feel dizzy. Place items in your home so that they are easy for you to reach without leaning over. Lifestyle  Do not use any products that contain nicotine or tobacco, such as cigarettes and e-cigarettes. If you need help quitting, ask your health care provider.  Try to reduce your stress level by using methods such as yoga or meditation. Talk with your health care provider if you need help to manage your stress. General instructions  Watch your dizziness for any changes.  Take over-the-counter and  prescription medicines only as told by your health care provider. Talk with your health care provider if you think that your dizziness is caused by a medicine that you are taking.  Tell a friend or a family member that you are feeling dizzy. If he or she notices any changes in your behavior, have this person call your health care provider.  Keep all follow-up visits as told by your health care provider. This is important. Contact a health care provider if:  Your dizziness does not go away.  Your dizziness or light-headedness gets worse.  You feel nauseous.  You have reduced hearing.  You have new symptoms.  You are unsteady on your feet or you feel like the room is spinning. Get help right away if:  You vomit or have diarrhea and are unable to eat or drink anything.  You have problems talking, walking, swallowing, or using your arms, hands, or legs.  You feel generally weak.  You are not thinking clearly or you have trouble forming sentences. It may take a friend or family member to notice this.  You have chest pain, abdominal pain, shortness of breath, or sweating.  Your vision changes.  You have any bleeding.  You have a severe headache.  You have neck pain or a stiff neck.  You have a fever. These symptoms may represent a serious problem that is an emergency. Do not wait to see if the symptoms will go away. Get medical help   right away. Call your local emergency services (911 in the [place] Do not drive yourself to the hospital. Summary  Dizziness is a feeling of unsteadiness or light-headedness. This condition can be caused by a number of things, including medicines, dehydration, or illness.  Anyone can become dizzy, but dizziness is more common in older adults.  Drink enough fluid to keep your urine clear or pale yellow. Do not drink alcohol.  Avoid making quick movements if you feel dizzy. Monitor your dizziness for any changes. This information is not intended to  replace advice given to you by your health care provider. Make sure you discuss any questions you have with your health care provider. Document Released: 12/05/2000 Document Revised: 06/14/2017 Document Reviewed: 07/14/2016 Elsevier Patient Education  2020 [employer].

## 2019-06-09 NOTE — Progress Notes (Signed)
Patient ID: Felicia Solis, female    DOB: 09/11/1965  Age: 53 y.o. MRN: [id number]    Subjective:  Subjective  HPI Felicia Solis presents for f/u dizziness.  It occurred again.  She has an appointment with ENT today.  Her ears still feel full.  No other complaints No cp, no sob, no palpitations   Review of Systems  Constitutional: Negative for appetite change, diaphoresis, fatigue and unexpected weight change.  Eyes: Negative for pain, redness and visual disturbance.  Respiratory: Negative for cough, chest tightness, shortness of breath and wheezing.   Cardiovascular: Negative for chest pain, palpitations and leg swelling.  Endocrine: Negative for cold intolerance, heat intolerance, polydipsia, polyphagia and polyuria.  Genitourinary: Negative for difficulty urinating, dysuria and frequency.  Neurological: Positive for dizziness and light-headedness. Negative for numbness and headaches.    History Past Medical History:  Diagnosis Date   Anemia    anemia  when pregnant in 2002   Anxiety    Arthritis    "knees and back" (09/28/2016)   Asthma 5 yrs ago    told she had asthma.. does not remember doctors name.    Chronic lower back pain    Depression    takes citalopram   History of hiatal hernia    History of kidney stones    Hyperlipidemia    Hypothyroidism    LBBB (left bundle branch block)    rate-related; identified on stress test 03/21/16   Migraine    "weekly" (09/28/2016)   Positive TB test 2008   pt states she took 9 months of medicine for positive tb skin test   Shortness of breath    with exertion   Thyroid disease    Type II diabetes mellitus ([place]) dx'd 2016    She has a past surgical history that includes Endometrial ablation; Tubal ligation; laparoscopy; Dilation and curettage of uterus (1999); Thyroidectomy (09/28/2011); Thyroidectomy (09/28/2016); Thyroidectomy (Left, 09/28/2016); and Extracorporeal shock wave lithotripsy (Right, 04/02/2019).   Her  family history includes Breast cancer in her maternal aunt; Breast cancer (age of onset: 13) in her maternal grandmother; Colon cancer in her paternal grandfather; Colon polyps in her father; Diabetes in her brother, mother, and sister; Gallbladder disease in her maternal grandmother and mother; Heart attack in her brother; Heart disease in her mother and another family member; Hyperlipidemia in her father and mother; Hypertension in her father and mother; Ovarian cancer in her maternal grandmother.She reports that she has never smoked. She has never used smokeless tobacco. She reports that she does not drink alcohol or use drugs.  Current Outpatient Medications on File Prior to Visit  Medication Sig Dispense Refill   acetaminophen (TYLENOL) 500 MG tablet Take 1,000 mg by mouth every 6 (six) hours as needed for mild pain.     ALPRAZolam (XANAX) 0.5 MG tablet Take 1 tablet (0.5 mg total) by mouth 3 (three) times daily as needed for anxiety. 10 tablet 0   amoxicillin (AMOXIL) 875 MG tablet Take 1 tablet (875 mg total) by mouth 2 (two) times daily. 20 tablet 0   aspirin EC 81 MG tablet Take 1 tablet (81 mg total) by mouth daily. 90 tablet 3   azelastine (ASTELIN) 0.1 % nasal spray Place 1 spray into both nostrils 2 (two) times daily. Use in each nostril as directed 30 mL 12   blood glucose meter kit and supplies KIT Dispense based on patient and insurance preference. Use up to four times daily as directed. (FOR ICD-9  250.00, 250.01). 1 each 0   Cholecalciferol (VITAMIN D3) 2000 units TABS Take 2,000 Units by mouth daily.      citalopram (CELEXA) 20 MG tablet Take 1 tablet (20 mg total) by mouth daily. 90 tablet 3   cyanocobalamin 500 MCG tablet Take 1,000 mcg by mouth daily.      Flaxseed, Linseed, (FLAXSEED OIL PO) Take 1,300 mg by mouth daily.     fluticasone (FLONASE) 50 MCG/ACT nasal spray Place 2 sprays into both nostrils daily. 16 g 6   Ginkgo Biloba Extract 60 MG CAPS Take 60 mg by  mouth daily.     glimepiride (AMARYL) 2 MG tablet Take 1 tablet (2 mg total) by mouth daily before breakfast. 90 tablet 1   glucose blood (ONETOUCH VERIO) test strip Onetouch Verio Check blood sugar twice daily 100 each 12   hydrochlorothiazide (HYDRODIURIL) 25 MG tablet Take 1 tablet (25 mg total) by mouth daily. (Patient taking differently: Take 25 mg by mouth daily as needed. ) 30 tablet 2   HYDROcodone-acetaminophen (NORCO) 5-325 MG tablet Take 1 tablet by mouth every 6 (six) hours as needed for moderate pain. 30 tablet 0   levocetirizine (XYZAL) 5 MG tablet Take 1 tablet (5 mg total) by mouth every evening. 30 tablet 5   levothyroxine (SYNTHROID) 88 MCG tablet Take 1 tablet (88 mcg total) by mouth daily. 30 tablet 2   losartan (COZAAR) 25 MG tablet Take 1 tablet by mouth once daily 90 tablet 1   Omega-3 Fatty Acids (FISH OIL) 1200 MG CAPS Take 1,200 mg by mouth daily.     OneTouch Delica Lancets 13K MISC 1 Device by Does not apply route 2 (two) times daily as needed. 60 each 5   pantoprazole (PROTONIX) 40 MG tablet Take 1 tablet (40 mg total) by mouth daily. 90 tablet 3   rosuvastatin (CRESTOR) 20 MG tablet Take 1 tablet (20 mg total) by mouth daily. 90 tablet 1   Semaglutide,0.25 or 0.'5MG'$ /DOS, (OZEMPIC, 0.25 OR 0.5 MG/DOSE,) 2 MG/1.5ML SOPN Inject 0.25 mg into the skin once a week. 1.5 mL 5   No current facility-administered medications on file prior to visit.     Objective:  Objective  Physical Exam Vitals and nursing note reviewed.  Constitutional:      Appearance: She is well-developed.  HENT:     Head: Normocephalic and atraumatic.     Right Ear: Tympanic membrane, ear canal and external ear normal. There is no impacted cerumen.     Left Ear: There is impacted cerumen.  Eyes:     Conjunctiva/sclera: Conjunctivae normal.  Neck:     Thyroid: No thyromegaly.     Vascular: No carotid bruit or JVD.  Cardiovascular:     Rate and Rhythm: Normal rate and regular rhythm.      Heart sounds: Normal heart sounds. No murmur.  Pulmonary:     Effort: Pulmonary effort is normal. No respiratory distress.     Breath sounds: Normal breath sounds. No wheezing or rales.  Chest:     Chest wall: No tenderness.  Musculoskeletal:     Cervical back: Normal range of motion and neck supple.  Neurological:     Mental Status: She is alert and oriented to person, place, and time.    BP 120/82    Pulse 76    Temp 97.6 F (36.4 C) (Temporal)    Resp 18    Ht '5\' 6"'$  (1.676 m)    Wt 223 lb 12.8 oz (  101.5 kg)    SpO2 99%    BMI 36.12 kg/m  Wt Readings from Last 3 Encounters:  06/09/19 223 lb 12.8 oz (101.5 kg)  04/30/19 222 lb (100.7 kg)  04/20/19 223 lb 9.6 oz (101.4 kg)     Lab Results  Component Value Date   WBC 9.1 04/20/2019   HGB 11.8 (L) 04/20/2019   HCT 36.5 04/20/2019   PLT 324.0 04/20/2019   GLUCOSE 113 (H) 05/26/2019   CHOL 100 05/26/2019   TRIG 56.0 05/26/2019   HDL 34.70 (L) 05/26/2019   LDLCALC 54 05/26/2019   ALT 17 05/26/2019   AST 14 05/26/2019   NA 143 05/26/2019   K 3.7 05/26/2019   CL 107 05/26/2019   CREATININE 0.47 05/26/2019   BUN 10 05/26/2019   CO2 26 05/26/2019   TSH 0.23 (L) 05/26/2019   HGBA1C 7.3 (H) 05/26/2019   MICROALBUR 2.5 (H) 04/04/2017    CT CARDIAC SCORING  Addendum Date: 05/12/2019   ADDENDUM REPORT: 05/12/2019 19:41 EXAM: OVER-READ INTERPRETATION  CT CHEST The following report is an over-read performed by radiologist Dr. Collene Leyden [facility], [place] on 05/12/2019. This over-read does not include interpretation of cardiac or coronary anatomy or pathology. The coronary calcium score interpretation by the cardiologist is attached. COMPARISON:  CTA chest 04/21/2019 FINDINGS: Heart is normal size. Visualized aorta normal caliber. No adenopathy in the lower mediastinum or hila. Visualized lungs clear. No effusions. Imaging into the upper abdomen shows no acute findings. Chest wall soft tissues are unremarkable. No acute  bony abnormality. IMPRESSION: No acute or significant extracardiac abnormality. Electronically Signed   By: Rolm Baptise M.D.   On: 05/12/2019 19:41   Result Date: 05/12/2019 CLINICAL DATA:  Risk stratification HISTORY OF PRESENT ILLNESS: Coronary Calcium Score PROCEDURE: The patient was scanned on a [employer] scanner. Axial non-contrast 3 mm slices were carried out through the heart. The data set was analyzed on a dedicated work station and scored using the [place]. FINDINGS: Non-cardiac: See separate report from [facility]. Ascending Aorta: Normal caliber Pericardium: Normal Coronary arteries: Normal origins IMPRESSION: 1. Coronary calcium score of 0. This was 0 percentile for age and sex matched control. Electronically Signed: By: Pixie Casino M.D. On: 05/12/2019 16:09     Assessment & Plan:  Plan  I am having Wynell B. Campillo maintain her acetaminophen, vitamin B-12, Ginkgo Biloba Extract, (Flaxseed, Linseed, (FLAXSEED OIL PO)), Fish Oil, Vitamin D3, aspirin EC, blood glucose meter kit and supplies, ALPRAZolam, citalopram, rosuvastatin, pantoprazole, hydrochlorothiazide, losartan, glimepiride, fluticasone, levocetirizine, OneTouch Delica Lancets 78E, OneTouch Verio, HYDROcodone-acetaminophen, Ozempic (0.25 or 0.5 MG/DOSE), levothyroxine, azelastine, and amoxicillin.  No orders of the defined types were placed in this encounter.   Problem List Items Addressed This Visit      Unprioritized   Impacted cerumen of left ear - Primary    Unable to use cerumen hoop---  Irrigated successfully        Right acute serous otitis media    con't astelin / flonase and antihistamine See ENT today       Vertigo    con't with prn meclizine See ENT today Consider MRI / neuro if problem con't          Follow-up: Return in about 3 weeks (around 06/30/2019) for hypertension.  [name], DO

## 2019-06-09 NOTE — Assessment & Plan Note (Signed)
Unable to use cerumen hoop---  Irrigated successfully

## 2019-06-10 NOTE — Progress Notes (Signed)
This encounter was created in error - please disregard.

## 2019-06-15 DIAGNOSIS — Z20828 Contact with and (suspected) exposure to other viral communicable diseases: Secondary | ICD-10-CM | POA: Diagnosis not present

## 2019-06-15 DIAGNOSIS — U071 COVID-19: Secondary | ICD-10-CM | POA: Diagnosis not present

## 2019-06-15 DIAGNOSIS — N202 Calculus of kidney with calculus of ureter: Secondary | ICD-10-CM | POA: Diagnosis not present

## 2019-06-22 DIAGNOSIS — U071 COVID-19: Secondary | ICD-10-CM | POA: Diagnosis not present

## 2019-06-22 DIAGNOSIS — Z20828 Contact with and (suspected) exposure to other viral communicable diseases: Secondary | ICD-10-CM | POA: Diagnosis not present

## 2019-06-25 ENCOUNTER — Other Ambulatory Visit: Payer: Self-pay

## 2019-06-25 ENCOUNTER — Ambulatory Visit (INDEPENDENT_AMBULATORY_CARE_PROVIDER_SITE_OTHER): Payer: BLUE CROSS/BLUE SHIELD | Admitting: Internal Medicine

## 2019-06-25 ENCOUNTER — Encounter: Payer: Self-pay | Admitting: Internal Medicine

## 2019-06-25 DIAGNOSIS — R109 Unspecified abdominal pain: Secondary | ICD-10-CM

## 2019-06-25 DIAGNOSIS — M545 Low back pain, unspecified: Secondary | ICD-10-CM

## 2019-06-25 DIAGNOSIS — R3129 Other microscopic hematuria: Secondary | ICD-10-CM

## 2019-06-25 MED ORDER — CYCLOBENZAPRINE HCL 10 MG PO TABS: 10.0000 mg | ORAL_TABLET | Freq: Every evening | ORAL | 0 refills | Status: DC | PRN

## 2019-06-25 MED ORDER — HYDROCODONE-ACETAMINOPHEN 5-325 MG PO TABS: 1.0000 | ORAL_TABLET | Freq: Four times a day (QID) | ORAL | 0 refills | Status: DC | PRN

## 2019-06-25 NOTE — Progress Notes (Signed)
Subjective:    Patient ID: Felicia Solis, female    DOB: [birth year], 53 y.o.   MRN: [id number]  DOS:  06/25/2019 Type of visit - description: Virtual Visit via Video Note  I connected with the above patient  by a video enabled telemedicine application and verified that I am speaking with the correct person using two identifiers.   THIS ENCOUNTER IS A VIRTUAL VISIT DUE TO COVID-19 - PATIENT WAS NOT SEEN IN THE OFFICE. PATIENT HAS CONSENTED TO VIRTUAL VISIT / TELEMEDICINE VISIT   Location of patient: home  Location of provider: office  I discussed the limitations of evaluation and management by telemedicine and the availability of in person appointments. The patient expressed understanding and agreed to proceed.  History of Present Illness:  Acute For several days, the patient had mild low back pain, at the mid back, reports no radiation, paresthesias or a rash. She is a CNA, does heavy lifting at work. She took a Tylenol earlier today with some help.     Review of Systems Denies fever chills No recent fall or injury No history of back surgery No dysuria, gross hematuria or difficulty urinating.  She does have a history of kidney stones but this pain is different from previous stones.   Past Medical History:  Diagnosis Date  . Anemia    anemia  when pregnant in 2002  . Anxiety   . Arthritis    "knees and back" (09/28/2016)  . Asthma 5 yrs ago    told she had asthma.. does not remember doctors name.   . Chronic lower back pain   . Depression    takes citalopram  . History of hiatal hernia   . History of kidney stones   . Hyperlipidemia   . Hypothyroidism   . LBBB (left bundle branch block)    rate-related; identified on stress test 03/21/16  . Migraine    "weekly" (09/28/2016)  . 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 Shortness of breath    with exertion  . Thyroid disease   . Type II diabetes mellitus ([place])  dx'd 2016    Past Surgical History:  Procedure Laterality Date  . DILATION AND CURETTAGE OF UTERUS  1999  . ENDOMETRIAL ABLATION    . EXTRACORPOREAL SHOCK WAVE LITHOTRIPSY Right 04/02/2019   Procedure: EXTRACORPOREAL SHOCK WAVE LITHOTRIPSY (ESWL);  Surgeon: Ceasar Mons, MD;  Location: [facility];  Service: Urology;  Laterality: Right;  . LAPAROSCOPY     years ago ..pt does not know where surgery was done ... states" my stomach would blow up then go down"  . THYROIDECTOMY  09/28/2011   Procedure: THYROIDECTOMY;  Surgeon: Izora Gala, MD;  Location: [place];  Service: ENT;  Laterality: Right;  RIGHT THYROIDECTOMY WITH FROZEN SECTION  . THYROIDECTOMY  09/28/2016   completion of thyroidectomy/notes 09/28/2016  . THYROIDECTOMY Left 09/28/2016   Procedure: COMPLETION OF THYROIDECTOMY;  Surgeon: Izora Gala, MD;  Location: [place];  Service: ENT;  Laterality: Left;  . TUBAL LIGATION      Social History   Socioeconomic History  . Marital status: Married    Spouse name: Not on file  . Number of children: 3  . Years of education: Not on file  . Highest education level: Not on file  Occupational History  . Occupation: CNA  Tobacco Use  . Smoking status: Never Smoker  . Smokeless tobacco: Never  Used  Substance and Sexual Activity  . Alcohol use: No    Alcohol/week: 0.0 standard drinks  . Drug use: No  . Sexual activity: Not on file  Other Topics Concern  . Not on file  Social History Narrative   No exercise--  Walking and lifting pts at work   Social Determinants of Health   Financial Resource Strain:   . Difficulty of Paying Living Expenses: Not on file  Food Insecurity:   . Worried About Charity fundraiser in the Last Year: Not on file  . Ran Out of Food in the Last Year: Not on file  Transportation Needs:   . Lack of Transportation (Medical): Not on file  . Lack of Transportation (Non-Medical): Not on file  Physical Activity:   . Days of Exercise per Week: Not on file  .  Minutes of Exercise per Session: Not on file  Stress:   . Feeling of Stress : Not on file  Social Connections:   . Frequency of Communication with Friends and Family: Not on file  . Frequency of Social Gatherings with Friends and Family: Not on file  . Attends Religious Services: Not on file  . Active Member of Clubs or Organizations: Not on file  . Attends Archivist Meetings: Not on file  . Marital Status: Not on file  Intimate Partner Violence:   . Fear of Current or Ex-Partner: Not on file  . Emotionally Abused: Not on file  . Physically Abused: Not on file  . Sexually Abused: Not on file      Allergies as of 06/25/2019      Reactions   Lisinopril Cough      Medication List       Accurate as of June 25, 2019 11:31 AM. If you have any questions, ask your nurse or doctor.        acetaminophen 500 MG tablet Commonly known as: TYLENOL Take 1,000 mg by mouth every 6 (six) hours as needed for mild pain.   ALPRAZolam 0.5 MG tablet Commonly known as: Xanax Take 1 tablet (0.5 mg total) by mouth 3 (three) times daily as needed for anxiety.   amoxicillin 875 MG tablet Commonly known as: AMOXIL Take 1 tablet (875 mg total) by mouth 2 (two) times daily.   aspirin EC 81 MG tablet Take 1 tablet (81 mg total) by mouth daily.   azelastine 0.1 % nasal spray Commonly known as: ASTELIN Place 1 spray into both nostrils 2 (two) times daily. Use in each nostril as directed   blood glucose meter kit and supplies Kit Dispense based on patient and insurance preference. Use up to four times daily as directed. (FOR ICD-9 250.00, 250.01).   citalopram 20 MG tablet Commonly known as: CELEXA Take 1 tablet (20 mg total) by mouth daily.   Fish Oil 1200 MG Caps Take 1,200 mg by mouth daily.   FLAXSEED OIL PO Take 1,300 mg by mouth daily.   fluticasone 50 MCG/ACT nasal spray Commonly known as: FLONASE Place 2 sprays into both nostrils daily.   Ginkgo Biloba Extract 60  MG Caps Take 60 mg by mouth daily.   glimepiride 2 MG tablet Commonly known as: AMARYL Take 1 tablet (2 mg total) by mouth daily before breakfast.   hydrochlorothiazide 25 MG tablet Commonly known as: HYDRODIURIL Take 1 tablet (25 mg total) by mouth daily. What changed:   when to take this  reasons to take this   HYDROcodone-acetaminophen 5-325 MG  tablet Commonly known as: Norco Take 1 tablet by mouth every 6 (six) hours as needed for moderate pain.   levocetirizine 5 MG tablet Commonly known as: Xyzal Take 1 tablet (5 mg total) by mouth every evening.   levothyroxine 88 MCG tablet Commonly known as: SYNTHROID Take 1 tablet (88 mcg total) by mouth daily.   losartan 25 MG tablet Commonly known as: COZAAR Take 1 tablet by mouth once daily   OneTouch Delica Lancets 35D Misc 1 Device by Does not apply route 2 (two) times daily as needed.   OneTouch Verio test strip Generic drug: glucose blood Onetouch Verio Check blood sugar twice daily   Ozempic (0.25 or 0.5 MG/DOSE) 2 MG/1.5ML Sopn Generic drug: Semaglutide(0.25 or 0.'5MG'$ /DOS) Inject 0.25 mg into the skin once a week.   pantoprazole 40 MG tablet Commonly known as: PROTONIX Take 1 tablet (40 mg total) by mouth daily.   rosuvastatin 20 MG tablet Commonly known as: CRESTOR Take 1 tablet (20 mg total) by mouth daily.   vitamin B-12 500 MCG tablet Commonly known as: CYANOCOBALAMIN Take 1,000 mcg by mouth daily.   Vitamin D3 50 MCG (2000 UT) Tabs Take 2,000 Units by mouth daily.           Objective:   Physical Exam There were no vitals taken for this visit. This is a virtual video visit, she is alert oriented x3, sitting in her car, in no apparent distress.    Assessment    46 -year-old female,PMH includes DM, hypothyroidism, HTN, BTL, kidney stones presents with  Lumbalgia: Low back pain without radiation or paresthesias for the last few days, increased for 1 day. Took one Tylenol this morning with  some relief. On clinical grounds, no evidence of radiculopathy or kidney stones. We agreed on the following: Ibuprofen helps better than Tylenol typically, thus rec to take ibuprofen 200 mg 2 tablets twice a day as needed, with food, watch for GI side effects If the pain continue she can take Tylenol. If the pain is severe, she could use hydrocodone, watch for drowsiness, do not drive if she has side effects.Rx sent Also can take Flexeril as needed, Rx sent See PCP in few days if not improving. If severe symptoms, fever, chills, rash: Go to the ER.    I discussed the assessment and treatment plan with the patient. The patient was provided an opportunity to ask questions and all were answered. The patient agreed with the plan and demonstrated an understanding of the instructions.   The patient was advised to call back or seek an in-person evaluation if the symptoms worsen or if the condition fails to improve as anticipated.

## 2019-06-29 ENCOUNTER — Ambulatory Visit: Payer: Self-pay | Admitting: Family Medicine

## 2019-06-29 DIAGNOSIS — Z20828 Contact with and (suspected) exposure to other viral communicable diseases: Secondary | ICD-10-CM | POA: Diagnosis not present

## 2019-06-29 NOTE — Telephone Encounter (Signed)
Pt  scheduled for virtual tomorrow  

## 2019-06-29 NOTE — Telephone Encounter (Signed)
Pt seen 12/4 and 12/15 for similar issue.ENT 06/09/2019.  Reports dizziness reoccurred this weekend. States lightheaded at times, other times "Spinning." Also reports pain of right eye and headache presently. States completed antibiotics prescribed at earlier appt. States was told to CB if dizziness unresolved.  No new meds (Dizziness began prior to flexeril Rx.)  Denies any CP, SOB, no weakness or visual changes.  Seen by Dr. Larose Kells 06/25/2019  reports "Still have lower back pain."  Please advise: [phone]  Reason for Disposition . [1] MODERATE dizziness (e.g., vertigo; feels very unsteady, interferes with normal activities) AND [2] has been evaluated by physician for this  Answer Assessment - Initial Assessment Questions 1. DESCRIPTION: "Describe your dizziness."     Spinning sometimes, other times lightheaded 2. VERTIGO: "Do you feel like either you or the room is spinning or tilting?"      At times 3. LIGHTHEADED: "Do you feel lightheaded?" (e.g., somewhat faint, woozy, weak upon standing)     yes 4. SEVERITY: "How bad is it?"  "Can you walk?"   - MILD - Feels unsteady but walking normally.   - MODERATE - Feels very unsteady when walking, but not falling; interferes with normal activities (e.g., school, work) .   - SEVERE - Unable to walk without falling (requires assistance).     moderate 5. ONSET:  "When did the dizziness begin?"     3-4 weeks ago, reoccurred over weekend 6. AGGRAVATING FACTORS: "Does anything make it worse?" (e.g., standing, change in head position)     no 7. CAUSE: "What do you think is causing the dizziness?"     unsure 8. RECURRENT SYMPTOM: "Have you had dizziness before?" If so, ask: "When was the last time?" "What happened that time?"     Yes 3-4 weeks ago 9. OTHER SYMPTOMS: "Do you have any other symptoms?" (e.g., headache, weakness, numbness, vomiting, earache)    Headache, right eye pain 7/10  Protocols used: DIZZINESS - VERTIGO-A-AH

## 2019-06-30 ENCOUNTER — Encounter: Payer: Self-pay | Admitting: Family Medicine

## 2019-06-30 ENCOUNTER — Ambulatory Visit (INDEPENDENT_AMBULATORY_CARE_PROVIDER_SITE_OTHER): Payer: BLUE CROSS/BLUE SHIELD | Admitting: Family Medicine

## 2019-06-30 ENCOUNTER — Other Ambulatory Visit: Payer: Self-pay

## 2019-06-30 ENCOUNTER — Ambulatory Visit (HOSPITAL_BASED_OUTPATIENT_CLINIC_OR_DEPARTMENT_OTHER)
Admission: RE | Admit: 2019-06-30 | Discharge: 2019-06-30 | Disposition: A | Discharge status: Home / Self Care | Payer: BLUE CROSS/BLUE SHIELD | Source: Ambulatory Visit | Attending: Family Medicine | Admitting: Family Medicine

## 2019-06-30 DIAGNOSIS — R42 Dizziness and giddiness: Secondary | ICD-10-CM

## 2019-06-30 DIAGNOSIS — M5442 Lumbago with sciatica, left side: Secondary | ICD-10-CM | POA: Insufficient documentation

## 2019-06-30 DIAGNOSIS — G4452 New daily persistent headache (NDPH): Secondary | ICD-10-CM

## 2019-06-30 DIAGNOSIS — M545 Low back pain: Secondary | ICD-10-CM | POA: Diagnosis not present

## 2019-06-30 MED ORDER — PREDNISONE 10 MG PO TABS: ORAL_TABLET | ORAL | 0 refills | Status: DC

## 2019-06-30 NOTE — Progress Notes (Signed)
Virtual Visit via Video Note  I connected with Felicia Solis on 06/30/19 at  2:40 PM EST by a video enabled telemedicine application and verified that I am speaking with the correct person using two identifiers.  Location: Patient: home alone Provider: home   I discussed the limitations of evaluation and management by telemedicine and the availability of in person appointments. The patient expressed understanding and agreed to proceed.  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  Pain came on over the a couple of days   We she saw Dr Larose Kells it was in the mid low back but the last few days it was in the L low back and radiated to L buttock.  She is taking the flexeril and hydrocodone with some relief but it makes her sleepy.   Walking exacerbates pain and bending and arching back exacerbates pain.      Observations/Objective: There were no vitals filed for this visit.  Pt is in nad Visit had to be transitioned to phone call due to trouble with connection   Assessment and Plan: 1. New daily persistent headache With dizziness Ongoing Check MRI/ MRA  Refer to neuro  - MR Brain Wo Contrast; Future - MR Angiogram Head Wo Contrast; Future  2. Dizziness MRI/ MRA ---- refer to neuro rto if symptoms worsen or go to ER - MR Brain Wo Contrast; Future - MR Angiogram Head Wo Contrast; Future - Ambulatory referral to Neurology  3. Acute left-sided low back pain with left-sided sciatica Pt has muscle relaxer and pain med Check xray She may want to f/u ortho-- she saw Dr Lorin Mercy in the past  - DG Lumbar Spine Complete; Future - predniSONE (DELTASONE) 10 MG tablet; TAKE 3 TABLETS PO QD FOR 3 DAYS THEN TAKE 2 TABLETS PO QD FOR 3 DAYS THEN TAKE 1 TABLET PO QD FOR 3 DAYS THEN TAKE 1/2 TAB PO QD FOR 3 DAYS  Dispense: 20 tablet; Refill: 0  Follow Up Instructions:    I discussed the  assessment and treatment plan with the patient. The patient was provided an opportunity to ask questions and all were answered. The patient agreed with the plan and demonstrated an understanding of the instructions.   The patient was advised to call back or seek an in-person evaluation if the symptoms worsen or if the condition fails to improve as anticipated.  I provided 25 minutes of non-face-to-face time during this encounter.   [name], DO

## 2019-07-02 DIAGNOSIS — U071 COVID-19: Secondary | ICD-10-CM | POA: Diagnosis not present

## 2019-07-02 DIAGNOSIS — Z20828 Contact with and (suspected) exposure to other viral communicable diseases: Secondary | ICD-10-CM | POA: Diagnosis not present

## 2019-07-06 DIAGNOSIS — U071 COVID-19: Secondary | ICD-10-CM | POA: Diagnosis not present

## 2019-07-09 DIAGNOSIS — Z20828 Contact with and (suspected) exposure to other viral communicable diseases: Secondary | ICD-10-CM | POA: Diagnosis not present

## 2019-07-13 ENCOUNTER — Other Ambulatory Visit: Payer: Self-pay | Admitting: Family Medicine

## 2019-07-13 DIAGNOSIS — R42 Dizziness and giddiness: Secondary | ICD-10-CM

## 2019-07-13 DIAGNOSIS — R519 Headache, unspecified: Secondary | ICD-10-CM

## 2019-07-13 DIAGNOSIS — Z20828 Contact with and (suspected) exposure to other viral communicable diseases: Secondary | ICD-10-CM | POA: Diagnosis not present

## 2019-07-13 DIAGNOSIS — U071 COVID-19: Secondary | ICD-10-CM | POA: Diagnosis not present

## 2019-07-13 DIAGNOSIS — G8929 Other chronic pain: Secondary | ICD-10-CM

## 2019-07-16 DIAGNOSIS — U071 COVID-19: Secondary | ICD-10-CM | POA: Diagnosis not present

## 2019-07-20 DIAGNOSIS — Z20828 Contact with and (suspected) exposure to other viral communicable diseases: Secondary | ICD-10-CM | POA: Diagnosis not present

## 2019-07-20 DIAGNOSIS — U071 COVID-19: Secondary | ICD-10-CM | POA: Diagnosis not present

## 2019-07-21 ENCOUNTER — Encounter: Payer: Self-pay | Admitting: Orthopaedic Surgery

## 2019-07-21 ENCOUNTER — Other Ambulatory Visit: Payer: Self-pay

## 2019-07-21 ENCOUNTER — Ambulatory Visit (INDEPENDENT_AMBULATORY_CARE_PROVIDER_SITE_OTHER): Payer: BLUE CROSS/BLUE SHIELD | Admitting: Orthopaedic Surgery

## 2019-07-21 VITALS — BP 134/85 | HR 78 | Ht 66.0 in | Wt 223.0 lb

## 2019-07-21 DIAGNOSIS — M5020 Other cervical disc displacement, unspecified cervical region: Secondary | ICD-10-CM | POA: Diagnosis not present

## 2019-07-21 DIAGNOSIS — M5136 Other intervertebral disc degeneration, lumbar region: Secondary | ICD-10-CM | POA: Diagnosis not present

## 2019-07-21 NOTE — Progress Notes (Signed)
Office Visit Note   Patient: Felicia Solis           Date of Birth: July 24, 1965           MRN: [id number] Visit Date: 07/21/2019              Requested by: [address] [place], [place] [address] [place],  [place] [address] PCP: Carollee Herter, Alferd Apa, DO   Assessment & Plan: Visit Diagnoses:  1. HNP (herniated nucleus pulposus), cervical   2. Other intervertebral disc degeneration, lumbar region           With L3-4 mild to moderate facet degenerative changes without stenosis.  Plan: We reviewed lumbar and cervical MRI scans today as well as radiologist report.  Presently her cervical disc protrusion is not bothering her enough to consider operative intervention.  We will recheck her back in 2 to 3 months.  MRI scan lumbar reviewed.  No compressive lesions.  Should do walking and stretching program she is already been through therapy for her back prior to her lumbar MRI scan.  Follow-Up Instructions: Return in about 2 months (around 09/18/2019).   Orders:  No orders of the defined types were placed in this encounter.  No orders of the defined types were placed in this encounter.     Procedures: No procedures performed   Clinical Data: No additional findings.   Subjective: Chief Complaint  Patient presents with  . Lower Back - Pain    HPI 54 year old female returns with ongoing problems with low back pain that radiates from the mid upper lumbar region into the left buttocks around the left trochanter and down to the left knee stopping at the knee joint.  Symptoms of been on the left none on the right.  She is also had disc protrusion cervical spine right C6-7 with hand numbness and tends to wake her up.  She recently had some prednisone for her back she states that shot her sugars up she gets Victoza once a week also is on oral medication.  She is used hydrocodone and Flexeril.  She is finished the prednisone pack hopefully her sugars will be settling down  shortly. Patient had some dizzy spells and has MRI of the brain ordered and this test is pending in the next couple weeks. Review of Systems 14 point systems update unchanged from 03/17/2019 office visit other than as mentioned in HPI.  Objective: Vital Signs: BP 134/85   Pulse 78   Ht 5\' 6"  (1.676 m)   Wt 223 lb (101.2 kg)   BMI 35.99 kg/m   Physical Exam Constitutional:      Appearance: She is well-developed.  HENT:     Head: Normocephalic.     Right Ear: External ear normal.     Left Ear: External ear normal.  Eyes:     Pupils: Pupils are equal, round, and reactive to light.  Neck:     Thyroid: No thyromegaly.     Trachea: No tracheal deviation.  Cardiovascular:     Rate and Rhythm: Normal rate.  Pulmonary:     Effort: Pulmonary effort is normal.  Abdominal:     Palpations: Abdomen is soft.  Skin:    General: Skin is warm and dry.  Neurological:     Mental Status: She is alert and oriented to person, place, and time.  Psychiatric:        Behavior: Behavior normal.     Ortho Exam  patient is able to heel and toe walk.  She has discomfort palpation of the left trochanter also some pain with 30 degrees internal rotation left hip with left groin pain.  No hip flexion contracture.  Negative Trendelenburg gait.  Knees reach full extension negative popliteal compression test knee and ankle jerk 1+ and symmetrical.  Distal pulses are normal.  She has mild to moderate brachial plexus tenderness on the right negative on the left.  Biceps triceps wrist flexion extension finger extension interossei are all normal to testing.  Specialty Comments:  No specialty comments available.  Imaging: No results found.   PMFS History: Patient Active Problem List   Diagnosis Date Noted  . Vertigo 06/09/2019  . Right acute serous otitis media 06/09/2019  . Impacted cerumen of left ear 06/09/2019  . Pelvic pain 02/23/2019  . HNP (herniated nucleus pulposus), cervical 01/04/2019  .  Epigastric mass 10/21/2018  . Myalgia 03/18/2018  . Hyperlipidemia associated with type 2 diabetes mellitus ([place]) 03/18/2018  . Nausea and vomiting 12/02/2017  . Hematuria 12/02/2017  . Pansinusitis 11/19/2017  . 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 Preventative health care 09/27/2017  . Hyperlipidemia LDL goal <100 09/18/2017  . Vitamin D deficiency 09/18/2017  . Hypothyroidism 09/18/2017  . Other intervertebral disc degeneration, lumbar region 04/07/2017  . Left hip pain 04/07/2017  . Hyperlipidemia LDL goal <70 04/07/2017  . Essential hypertension 04/07/2017  . Acute vaginitis 04/07/2017  . S/P complete thyroidectomy 09/28/2016  . Memory loss 08/23/2016  . Thyroid nodule 08/02/2016  . Plantar fasciitis of right foot 12/01/2015  . Knee pain, right 12/01/2015  . Headache, migraine 09/06/2015  . Bilateral knee pain 08/22/2015  . Thoracic myofascial strain 08/22/2015  . Dysphagia 04/10/2015  . Atypical chest pain 04/10/2015  . Epigastric pain 04/10/2015  . Functional diarrhea 04/10/2015  . NSAID long-term use 04/10/2015  . Midepigastric pain 03/22/2015  . Cough 01/07/2015  . Allergic rhinitis 12/13/2014  . Acute bronchitis 12/13/2014  . Blood in stool 10/01/2014  . DM (diabetes mellitus) type II uncontrolled, periph vascular disorder ([place]) 08/13/2014  . Abnormal ECG 05/14/2012  . Breast pain 10/23/2011  . ASCUS on Pap smear 10/08/2011  . H/O thyroid nodule 08/27/2011  . Depression with anxiety 08/27/2011  . Heavy periods 08/27/2011   Past Medical History:  Diagnosis Date  . Anemia    anemia  when pregnant in 2002  . Anxiety   . Arthritis    "knees and back" (09/28/2016)  . Asthma 5 yrs ago    told she had asthma.. does not remember doctors name.   . Chronic lower back pain   . Depression    takes citalopram  . History of hiatal hernia   . History of kidney stones   . Hyperlipidemia   . Hypothyroidism   . LBBB (left bundle branch block)    rate-related;  identified on stress test 03/21/16  . Migraine    "weekly" (09/28/2016)  . 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 Shortness of breath    with exertion  . Thyroid disease   . Type II diabetes mellitus ([place]) dx'd 2016    Family History  Problem Relation Age of Onset  . Diabetes Mother   . Hypertension Mother   . Hyperlipidemia Mother   . Gallbladder disease Mother   . Heart disease Mother   . Diabetes Sister   . Hypertension Father   .  Hyperlipidemia Father   . Colon polyps Father   . Ovarian cancer Maternal Grandmother   . Gallbladder disease Maternal Grandmother   . Breast cancer Maternal Grandmother 46  . Colon cancer Paternal Grandfather   . Breast cancer Maternal Aunt        late 48s  . Heart disease Other        early cad  . Diabetes Brother   . Heart attack Brother   . Anesthesia problems Neg Hx   . Hypotension Neg Hx   . Malignant hyperthermia Neg Hx   . Pseudochol deficiency Neg Hx   . Esophageal cancer Neg Hx     Past Surgical History:  Procedure Laterality Date  . DILATION AND CURETTAGE OF UTERUS  1999  . ENDOMETRIAL ABLATION    . EXTRACORPOREAL SHOCK WAVE LITHOTRIPSY Right 04/02/2019   Procedure: EXTRACORPOREAL SHOCK WAVE LITHOTRIPSY (ESWL);  Surgeon: Ceasar Mons, MD;  Location: [facility];  Service: Urology;  Laterality: Right;  . LAPAROSCOPY     years ago ..pt does not know where surgery was done ... states" my stomach would blow up then go down"  . THYROIDECTOMY  09/28/2011   Procedure: THYROIDECTOMY;  Surgeon: Izora Gala, MD;  Location: [facility];  Service: ENT;  Laterality: Right;  RIGHT THYROIDECTOMY WITH FROZEN SECTION  . THYROIDECTOMY  09/28/2016   completion of thyroidectomy/notes 09/28/2016  . THYROIDECTOMY Left 09/28/2016   Procedure: COMPLETION OF THYROIDECTOMY;  Surgeon: Izora Gala, MD;  Location: [facility];  Service: ENT;  Laterality: Left;  . TUBAL LIGATION     Social History   Occupational  History  . Occupation: CNA  Tobacco Use  . Smoking status: Never Smoker  . Smokeless tobacco: Never Used  Substance and Sexual Activity  . Alcohol use: No    Alcohol/week: 0.0 standard drinks  . Drug use: No  . Sexual activity: Not on file

## 2019-07-22 ENCOUNTER — Other Ambulatory Visit: Payer: Self-pay | Admitting: Family Medicine

## 2019-07-22 DIAGNOSIS — I10 Essential (primary) hypertension: Secondary | ICD-10-CM

## 2019-07-23 DIAGNOSIS — U071 COVID-19: Secondary | ICD-10-CM | POA: Diagnosis not present

## 2019-07-27 DIAGNOSIS — U071 COVID-19: Secondary | ICD-10-CM | POA: Diagnosis not present

## 2019-07-28 ENCOUNTER — Other Ambulatory Visit: Payer: Self-pay

## 2019-07-28 ENCOUNTER — Other Ambulatory Visit (INDEPENDENT_AMBULATORY_CARE_PROVIDER_SITE_OTHER): Payer: BLUE CROSS/BLUE SHIELD

## 2019-07-28 DIAGNOSIS — E039 Hypothyroidism, unspecified: Secondary | ICD-10-CM | POA: Diagnosis not present

## 2019-07-29 LAB — THYROID PANEL WITH TSH
Free Thyroxine Index: 2.4 (ref 1.4–3.8)
T3 Uptake: 30 % (ref 22–35)
T4, Total: 8 ug/dL (ref 5.1–11.9)
TSH: 0.85 mIU/L

## 2019-07-30 DIAGNOSIS — U071 COVID-19: Secondary | ICD-10-CM | POA: Diagnosis not present

## 2019-07-30 DIAGNOSIS — Z20828 Contact with and (suspected) exposure to other viral communicable diseases: Secondary | ICD-10-CM | POA: Diagnosis not present

## 2019-08-03 DIAGNOSIS — Z20828 Contact with and (suspected) exposure to other viral communicable diseases: Secondary | ICD-10-CM | POA: Diagnosis not present

## 2019-08-03 DIAGNOSIS — U071 COVID-19: Secondary | ICD-10-CM | POA: Diagnosis not present

## 2019-08-04 NOTE — Progress Notes (Signed)
NEUROLOGY CONSULTATION NOTE  TARAH BUBOLTZ MRN: [id number] DOB: 03-14-1966  Referring provider: Roma Schanz, DO Primary care provider: Roma Schanz, DO  Reason for consult:  Dizziness, headache  HISTORY OF PRESENT ILLNESS: Felicia Solis is a 54 year old female who presents for dizziness and headache.  History supplemented by ENT and referring provider notes.  In December, she began experiencing dizziness described as spinning sensation.  It first occurred upon getting up after she was bent over in the kitchen to clean the floor.  However, it occurs spontaneously as well.  It would last a couple of minutes.  No visual disturbance, nausea, vomiting, photophobia, phonophobia, weakness, slurred speech or headache.  She also felt that her left ear was clogged up and would pop.  She initially went to her PCP who found cerumen impaction which was removed and she was prescribed seasonal allergies and amoxicillin for possible otitis media.  However, she continued to have these spells.  She was evaluated by ENT, Dr. Constance Holster, in December.  Her exam was unremarkable and she reported no recent episodes.  She was advised to follow up if symptoms return.  However, the vertigo returned.  She also started developing aural fullness in her right ear as well (like she was on an airplane) associated with muffled hearing.  No tinnitus.  On one occasion, the episode was followed by a 7/10 right retr-orbital headache lasting for about an hour.  On occasion, she has a pressure on the top of her head.  She reports remote history of migraines.  Over the past year, she developed a right cervical radiculopathy with pain and numbness.  MRI of cervical spine from 12/24/2018 was personally reviewed and showed C6-7 disc extrusion with right-sided spinal stenosis and neural foraminal stenosis affecting the C7 nerve root.    PAST MEDICAL HISTORY: Past Medical History:  Diagnosis Date  . Anemia    anemia  when  pregnant in 2002  . Anxiety   . Arthritis    "knees and back" (09/28/2016)  . Asthma 5 yrs ago    told she had asthma.. does not remember doctors name.   . Chronic lower back pain   . Depression    takes citalopram  . History of hiatal hernia   . History of kidney stones   . Hyperlipidemia   . Hypothyroidism   . LBBB (left bundle branch block)    rate-related; identified on stress test 03/21/16  . Migraine    "weekly" (09/28/2016)  . 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 Shortness of breath    with exertion  . Thyroid disease   . Type II diabetes mellitus ([place]) dx'd 2016    PAST SURGICAL HISTORY: Past Surgical History:  Procedure Laterality Date  . DILATION AND CURETTAGE OF UTERUS  1999  . ENDOMETRIAL ABLATION    . EXTRACORPOREAL SHOCK WAVE LITHOTRIPSY Right 04/02/2019   Procedure: EXTRACORPOREAL SHOCK WAVE LITHOTRIPSY (ESWL);  Surgeon: Ceasar Mons, MD;  Location: [facility];  Service: Urology;  Laterality: Right;  . LAPAROSCOPY     years ago ..pt does not know where surgery was done ... states" my stomach would blow up then go down"  . THYROIDECTOMY  09/28/2011   Procedure: THYROIDECTOMY;  Surgeon: Izora Gala, MD;  Location: [facility];  Service: ENT;  Laterality: Right;  RIGHT THYROIDECTOMY WITH FROZEN SECTION  . THYROIDECTOMY  09/28/2016  completion of thyroidectomy/notes 09/28/2016  . THYROIDECTOMY Left 09/28/2016   Procedure: COMPLETION OF THYROIDECTOMY;  Surgeon: Izora Gala, MD;  Location: [facility];  Service: ENT;  Laterality: Left;  . TUBAL LIGATION      MEDICATIONS: Current Outpatient Medications on File Prior to Visit  Medication Sig Dispense Refill  . losartan (COZAAR) 25 MG tablet Take 1 tablet by mouth once daily 90 tablet 1  . acetaminophen (TYLENOL) 500 MG tablet Take 1,000 mg by mouth every 6 (six) hours as needed for mild pain.    Marland Kitchen ALPRAZolam (XANAX) 0.5 MG tablet Take 1 tablet (0.5 mg total) by mouth 3  (three) times daily as needed for anxiety. 10 tablet 0  . aspirin EC 81 MG tablet Take 1 tablet (81 mg total) by mouth daily. 90 tablet 3  . azelastine (ASTELIN) 0.1 % nasal spray Place 1 spray into both nostrils 2 (two) times daily. Use in each nostril as directed 30 mL 12  . blood glucose meter kit and supplies KIT Dispense based on patient and insurance preference. Use up to four times daily as directed. (FOR ICD-9 250.00, 250.01). 1 each 0  . Cholecalciferol (VITAMIN D3) 2000 units TABS Take 2,000 Units by mouth daily.     . citalopram (CELEXA) 20 MG tablet Take 1 tablet (20 mg total) by mouth daily. 90 tablet 3  . cyanocobalamin 500 MCG tablet Take 1,000 mcg by mouth daily.     . cyclobenzaprine (FLEXERIL) 10 MG tablet Take 1 tablet (10 mg total) by mouth at bedtime as needed for muscle spasms. 14 tablet 0  . Flaxseed, Linseed, (FLAXSEED OIL PO) Take 1,300 mg by mouth daily.    . fluticasone (FLONASE) 50 MCG/ACT nasal spray Place 2 sprays into both nostrils daily. 16 g 6  . Ginkgo Biloba Extract 60 MG CAPS Take 60 mg by mouth daily.    Marland Kitchen glimepiride (AMARYL) 2 MG tablet Take 1 tablet (2 mg total) by mouth daily before breakfast. 90 tablet 1  . glucose blood (ONETOUCH VERIO) test strip Onetouch Verio Check blood sugar twice daily 100 each 12  . hydrochlorothiazide (HYDRODIURIL) 25 MG tablet Take 1 tablet (25 mg total) by mouth daily. 30 tablet 2  . HYDROcodone-acetaminophen (NORCO) 5-325 MG tablet Take 1 tablet by mouth every 6 (six) hours as needed for moderate pain. 12 tablet 0  . levocetirizine (XYZAL) 5 MG tablet Take 1 tablet (5 mg total) by mouth every evening. 30 tablet 5  . levothyroxine (SYNTHROID) 88 MCG tablet Take 1 tablet (88 mcg total) by mouth daily. 30 tablet 2  . Omega-3 Fatty Acids (FISH OIL) 1200 MG CAPS Take 1,200 mg by mouth daily.    Glory Rosebush Delica Lancets 62I MISC 1 Device by Does not apply route 2 (two) times daily as needed. 60 each 5  . pantoprazole (PROTONIX) 40  MG tablet Take 1 tablet (40 mg total) by mouth daily. 90 tablet 3  . predniSONE (DELTASONE) 10 MG tablet TAKE 3 TABLETS PO QD FOR 3 DAYS THEN TAKE 2 TABLETS PO QD FOR 3 DAYS THEN TAKE 1 TABLET PO QD FOR 3 DAYS THEN TAKE 1/2 TAB PO QD FOR 3 DAYS (Patient not taking: Reported on 07/21/2019) 20 tablet 0  . rosuvastatin (CRESTOR) 20 MG tablet Take 1 tablet (20 mg total) by mouth daily. 90 tablet 1  . Semaglutide,0.25 or 0.'5MG'$ /DOS, (OZEMPIC, 0.25 OR 0.5 MG/DOSE,) 2 MG/1.5ML SOPN Inject 0.25 mg into the skin once a week. 1.5 mL 5  No current facility-administered medications on file prior to visit.    ALLERGIES: Allergies  Allergen Reactions  . Lisinopril Cough    FAMILY HISTORY: Family History  Problem Relation Age of Onset  . Diabetes Mother   . Hypertension Mother   . Hyperlipidemia Mother   . Gallbladder disease Mother   . Heart disease Mother   . Diabetes Sister   . Hypertension Father   . Hyperlipidemia Father   . Colon polyps Father   . Ovarian cancer Maternal Grandmother   . Gallbladder disease Maternal Grandmother   . Breast cancer Maternal Grandmother 65  . Colon cancer Paternal Grandfather   . Breast cancer Maternal Aunt        late 50s  . Heart disease Other        early cad  . Diabetes Brother   . Heart attack Brother   . Anesthesia problems Neg Hx   . Hypotension Neg Hx   . Malignant hyperthermia Neg Hx   . Pseudochol deficiency Neg Hx   . Esophageal cancer Neg Hx     SOCIAL HISTORY: Social History   Socioeconomic History  . Marital status: Married    Spouse name: Not on file  . Number of children: 3  . Years of education: Not on file  . Highest education level: Not on file  Occupational History  . Occupation: CNA  Tobacco Use  . Smoking status: Never Smoker  . Smokeless tobacco: Never Used  Substance and Sexual Activity  . Alcohol use: No    Alcohol/week: 0.0 standard drinks  . Drug use: No  . Sexual activity: Not on file  Other Topics Concern    . Not on file  Social History Narrative   No exercise--  Walking and lifting pts at work   Social Determinants of Health   Financial Resource Strain:   . Difficulty of Paying Living Expenses: Not on file  Food Insecurity:   . Worried About Charity fundraiser in the Last Year: Not on file  . Ran Out of Food in the Last Year: Not on file  Transportation Needs:   . Lack of Transportation (Medical): Not on file  . Lack of Transportation (Non-Medical): Not on file  Physical Activity:   . Days of Exercise per Week: Not on file  . Minutes of Exercise per Session: Not on file  Stress:   . Feeling of Stress : Not on file  Social Connections:   . Frequency of Communication with Friends and Family: Not on file  . Frequency of Social Gatherings with Friends and Family: Not on file  . Attends Religious Services: Not on file  . Active Member of Clubs or Organizations: Not on file  . Attends Archivist Meetings: Not on file  . Marital Status: Not on file  Intimate Partner Violence:   . Fear of Current or Ex-Partner: Not on file  . Emotionally Abused: Not on file  . Physically Abused: Not on file  . Sexually Abused: Not on file    PHYSICAL EXAM: Blood pressure 124/78, pulse 86, height '5\' 6"'$  (1.676 m), weight 220 lb (99.8 kg), SpO2 99 %.  General: No acute distress.  Patient appears well-groomed.  Head:  Normocephalic/atraumatic Eyes:  fundi examined but not visualized Neck: supple, no paraspinal tenderness, full range of motion Back: No paraspinal tenderness Heart: regular rate and rhythm Lungs: Clear to auscultation bilaterally. Vascular: No carotid bruits. Neurological Exam: Mental status: alert and oriented to person, place,  and time, recent and remote memory intact, fund of knowledge intact, attention and concentration intact, speech fluent and not dysarthric, language intact. Cranial nerves: CN I: not tested CN II: pupils equal, round and reactive to light, visual  fields intact CN III, IV, VI:  full range of motion, no nystagmus, no ptosis CN V: facial sensation intact CN VII: upper and lower face symmetric CN VIII: hearing intact CN IX, X: gag intact, uvula midline CN XI: sternocleidomastoid and trapezius muscles intact CN XII: tongue midline Bulk & Tone: normal, no fasciculations. Motor:  5/5 throughout  Sensation: temperature and vibration sensation intact. Deep Tendon Reflexes:  2+ throughout, toes downgoing.  Finger to nose testing:  Without dysmetria.  Heel to shin:  Without dysmetria.  Gait:  Normal station and stride.  Able to turn and tandem walk. Romberg negative.  IMPRESSION: Episodic vertigo.  Given the brief duration of the symptoms as well as the associated aural fullness, consider eustachian tube dysfunction.  She did report a headache, so vestibular migraine is considered as well.  Sometimes dizziness may be due to a cervicogenic component, but I do not think findings on cervical MRI would be contributing.  PLAN:  I would like her evaluated by Dr. Constance Holster again to see if he thinks eustachian tube dysfunction is probable.  She will follow up with me afterwards.  If inner ear etiology is ruled out, then we will proceed with further neurologic workup and initiate management for presumed migraine.   Thank you for allowing me to take part in the care of this patient.  Metta Clines, DO  CC: Roma Schanz, DO

## 2019-08-06 ENCOUNTER — Ambulatory Visit (INDEPENDENT_AMBULATORY_CARE_PROVIDER_SITE_OTHER): Payer: BLUE CROSS/BLUE SHIELD | Admitting: Neurology

## 2019-08-06 ENCOUNTER — Other Ambulatory Visit: Payer: Self-pay

## 2019-08-06 ENCOUNTER — Encounter: Payer: Self-pay | Admitting: Neurology

## 2019-08-06 VITALS — BP 124/78 | HR 86 | Ht 66.0 in | Wt 220.0 lb

## 2019-08-06 DIAGNOSIS — R42 Dizziness and giddiness: Secondary | ICD-10-CM

## 2019-08-06 DIAGNOSIS — Z20828 Contact with and (suspected) exposure to other viral communicable diseases: Secondary | ICD-10-CM | POA: Diagnosis not present

## 2019-08-06 DIAGNOSIS — U071 COVID-19: Secondary | ICD-10-CM | POA: Diagnosis not present

## 2019-08-06 NOTE — Patient Instructions (Addendum)
1.  First see Dr. Constance Holster to make sure it isn't anything like eustachian tube dysfunction.  If it isn't anything inner ear, then we will talk about further workup and possibly treating for migraine.  Make 3 month follow up for now (virtual visit).  I will see you sooner if needed (after visit with Dr. Constance Holster)  2. Appointment with Dr Constance Holster 08/11/2019 at 220.   [phone]

## 2019-08-10 DIAGNOSIS — Z20828 Contact with and (suspected) exposure to other viral communicable diseases: Secondary | ICD-10-CM | POA: Diagnosis not present

## 2019-08-10 DIAGNOSIS — U071 COVID-19: Secondary | ICD-10-CM | POA: Diagnosis not present

## 2019-08-11 ENCOUNTER — Encounter: Payer: Self-pay | Admitting: Family Medicine

## 2019-08-11 ENCOUNTER — Other Ambulatory Visit: Payer: Self-pay

## 2019-08-11 ENCOUNTER — Ambulatory Visit (INDEPENDENT_AMBULATORY_CARE_PROVIDER_SITE_OTHER): Payer: BLUE CROSS/BLUE SHIELD | Admitting: Family Medicine

## 2019-08-11 VITALS — BP 130/90 | HR 78 | Temp 97.3°F | Resp 18 | Ht 66.0 in | Wt 223.8 lb

## 2019-08-11 DIAGNOSIS — M654 Radial styloid tenosynovitis [de Quervain]: Secondary | ICD-10-CM

## 2019-08-11 DIAGNOSIS — M25512 Pain in left shoulder: Secondary | ICD-10-CM

## 2019-08-11 DIAGNOSIS — M25532 Pain in left wrist: Secondary | ICD-10-CM | POA: Diagnosis not present

## 2019-08-11 DIAGNOSIS — R519 Headache, unspecified: Secondary | ICD-10-CM

## 2019-08-11 MED ORDER — KETOROLAC TROMETHAMINE 60 MG/2ML IM SOLN
60.0000 mg | Freq: Once | INTRAMUSCULAR | Status: AC
  Administered 2019-08-11: 16:00:00 60 mg via INTRAMUSCULAR

## 2019-08-11 MED ORDER — MELOXICAM 15 MG PO TABS: 15.0000 mg | ORAL_TABLET | Freq: Every day | ORAL | 0 refills | Status: DC

## 2019-08-11 MED ORDER — CYCLOBENZAPRINE HCL 10 MG PO TABS: 10.0000 mg | ORAL_TABLET | Freq: Three times a day (TID) | ORAL | 0 refills | Status: DC | PRN

## 2019-08-11 NOTE — Progress Notes (Signed)
Patient ID: Felicia Solis, female    DOB: [birth year]  Age: 54 y.o. MRN: [id number]    Subjective:  Subjective  HPI Felicia Solis presents for R jaw pain and clicking x 2 months -- she denies grinding her teeth but sees the dentist next month She also c/o pain L shoulder and L thumb and wrist   No known injury-- but she does work wit patients-- lifting , holding up etc  Pt also c/o L hip pain --- hurts to lay on it -- no known injury   Review of Systems  Constitutional: Negative for appetite change, diaphoresis, fatigue and unexpected weight change.  Eyes: Negative for pain, redness and visual disturbance.  Respiratory: Negative for cough, chest tightness, shortness of breath and wheezing.   Cardiovascular: Negative for chest pain, palpitations and leg swelling.  Endocrine: Negative for cold intolerance, heat intolerance, polydipsia, polyphagia and polyuria.  Genitourinary: Negative for difficulty urinating, dysuria and frequency.  Musculoskeletal: Positive for arthralgias.  Neurological: Negative for dizziness, light-headedness, numbness and headaches.    History Past Medical History:  Diagnosis Date  . Anemia    anemia  when pregnant in 2002  . Anxiety   . Arthritis    "knees and back" (09/28/2016)  . Asthma 5 yrs ago    told she had asthma.. does not remember doctors name.   . Chronic lower back pain   . Depression    takes citalopram  . History of hiatal hernia   . History of kidney stones   . Hyperlipidemia   . Hypothyroidism   . LBBB (left bundle branch block)    rate-related; identified on stress test 03/21/16  . Migraine    "weekly" (09/28/2016)  . 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 Shortness of breath    with exertion  . Thyroid disease   . Type II diabetes mellitus ([place]) dx'd 2016    She has a past surgical history that includes Endometrial ablation; Tubal ligation; laparoscopy; Dilation and curettage of  uterus (1999); Thyroidectomy (09/28/2011); Thyroidectomy (09/28/2016); Thyroidectomy (Left, 09/28/2016); and Extracorporeal shock wave lithotripsy (Right, 04/02/2019).   Her family history includes Breast cancer in her maternal aunt; Breast cancer (age of onset: 36) in her maternal grandmother; Colon cancer in her paternal grandfather; Colon polyps in her father; Diabetes in her brother, mother, and sister; Gallbladder disease in her maternal grandmother and mother; Heart attack in her brother; Heart disease in her mother and another family member; Hyperlipidemia in her father and mother; Hypertension in her father and mother; Ovarian cancer in her maternal grandmother.She reports that she has never smoked. She has never used smokeless tobacco. She reports that she does not drink alcohol or use drugs.  Current Outpatient Medications on File Prior to Visit  Medication Sig Dispense Refill  . acetaminophen (TYLENOL) 500 MG tablet Take 1,000 mg by mouth every 6 (six) hours as needed for mild pain.    Marland Kitchen ALPRAZolam (XANAX) 0.5 MG tablet Take 1 tablet (0.5 mg total) by mouth 3 (three) times daily as needed for anxiety. 10 tablet 0  . aspirin EC 81 MG tablet Take 1 tablet (81 mg total) by mouth daily. 90 tablet 3  . azelastine (ASTELIN) 0.1 % nasal spray Place 1 spray into both nostrils 2 (two) times daily. Use in each nostril as directed 30 mL 12  . blood glucose meter kit and supplies KIT Dispense based on  patient and insurance preference. Use up to four times daily as directed. (FOR ICD-9 250.00, 250.01). 1 each 0  . Cholecalciferol (VITAMIN D3) 2000 units TABS Take 2,000 Units by mouth daily.     . citalopram (CELEXA) 20 MG tablet Take 1 tablet (20 mg total) by mouth daily. 90 tablet 3  . cyanocobalamin 500 MCG tablet Take 1,000 mcg by mouth daily.     . cyclobenzaprine (FLEXERIL) 10 MG tablet Take 1 tablet (10 mg total) by mouth at bedtime as needed for muscle spasms. 14 tablet 0  . Flaxseed, Linseed,  (FLAXSEED OIL PO) Take 1,300 mg by mouth daily.    . fluticasone (FLONASE) 50 MCG/ACT nasal spray Place 2 sprays into both nostrils daily. 16 g 6  . Ginkgo Biloba Extract 60 MG CAPS Take 60 mg by mouth daily.    Marland Kitchen glimepiride (AMARYL) 2 MG tablet Take 1 tablet (2 mg total) by mouth daily before breakfast. 90 tablet 1  . glucose blood (ONETOUCH VERIO) test strip Onetouch Verio Check blood sugar twice daily 100 each 12  . levocetirizine (XYZAL) 5 MG tablet Take 1 tablet (5 mg total) by mouth every evening. 30 tablet 5  . levothyroxine (SYNTHROID) 88 MCG tablet Take 1 tablet (88 mcg total) by mouth daily. 30 tablet 2  . losartan (COZAAR) 25 MG tablet Take 1 tablet by mouth once daily 90 tablet 1  . Omega-3 Fatty Acids (FISH OIL) 1200 MG CAPS Take 1,200 mg by mouth daily.    Glory Rosebush Delica Lancets 79T MISC 1 Device by Does not apply route 2 (two) times daily as needed. 60 each 5  . pantoprazole (PROTONIX) 40 MG tablet Take 1 tablet (40 mg total) by mouth daily. 90 tablet 3  . rosuvastatin (CRESTOR) 20 MG tablet Take 1 tablet (20 mg total) by mouth daily. 90 tablet 1  . Semaglutide,0.25 or 0.5MG/DOS, (OZEMPIC, 0.25 OR 0.5 MG/DOSE,) 2 MG/1.5ML SOPN Inject 0.25 mg into the skin once a week. 1.5 mL 5   No current facility-administered medications on file prior to visit.     Objective:  Objective  Physical Exam Vitals and nursing note reviewed.  Constitutional:      Appearance: She is well-developed.  HENT:     Head: Normocephalic and atraumatic.  Eyes:     Conjunctiva/sclera: Conjunctivae normal.  Neck:     Thyroid: No thyromegaly.     Vascular: No carotid bruit or JVD.  Cardiovascular:     Rate and Rhythm: Normal rate and regular rhythm.     Heart sounds: Normal heart sounds. No murmur.  Pulmonary:     Effort: Pulmonary effort is normal. No respiratory distress.     Breath sounds: Normal breath sounds. No wheezing or rales.  Chest:     Chest wall: No tenderness.  Musculoskeletal:         General: Tenderness present. No swelling or deformity.     Left wrist: Tenderness present. No swelling, snuff box tenderness or crepitus. Decreased range of motion. Normal pulse.       Arms:     Cervical back: Normal range of motion and neck supple.  Neurological:     Mental Status: She is alert and oriented to person, place, and time.    BP 130/90 (BP Location: Left Arm, Patient Position: Sitting, Cuff Size: Large)   Pulse 78   Temp (!) 97.3 F (36.3 C) (Temporal)   Resp 18   Ht _0  (1.676 m)   Wt 223  lb 12.8 oz (101.5 kg)   SpO2 98%   BMI 36.12 kg/m  Wt Readings from Last 3 Encounters:  08/11/19 223 lb 12.8 oz (101.5 kg)  08/06/19 220 lb (99.8 kg)  07/21/19 223 lb (101.2 kg)     Lab Results  Component Value Date   WBC 9.1 04/20/2019   HGB 11.8 (L) 04/20/2019   HCT 36.5 04/20/2019   PLT 324.0 04/20/2019   GLUCOSE 113 (H) 05/26/2019   CHOL 100 05/26/2019   TRIG 56.0 05/26/2019   HDL 34.70 (L) 05/26/2019   LDLCALC 54 05/26/2019   ALT 17 05/26/2019   AST 14 05/26/2019   NA 143 05/26/2019   K 3.7 05/26/2019   CL 107 05/26/2019   CREATININE 0.47 05/26/2019   BUN 10 05/26/2019   CO2 26 05/26/2019   TSH 0.85 07/28/2019   HGBA1C 7.3 (H) 05/26/2019   MICROALBUR 2.5 (H) 04/04/2017    DG Lumbar Spine Complete  Result Date: 07/01/2019 CLINICAL DATA:  Low back pain for 2 weeks EXAM: LUMBAR SPINE - COMPLETE 4+ VIEW COMPARISON:  01/02/2019 FINDINGS: Six non-rib-bearing lumbar type segments. Vertebral body and disc space heights maintained. Mild facet degenerative changes lower lumbar spine. Vertebral body and disc space heights maintained. No fracture, subluxation or bone destruction. No spondylolysis. SI joints preserved. IMPRESSION: Mild facet degenerative changes lower lumbar spine. No acute abnormalities. Electronically Signed   By: Lavonia Dana M.D.   On: 07/01/2019 08:26     Assessment & Plan:  Plan  I am having Rennae B. Trant start on cyclobenzaprine and  meloxicam. I am also having her maintain her acetaminophen, vitamin B-12, Ginkgo Biloba Extract, (Flaxseed, Linseed, (FLAXSEED OIL PO)), Fish Oil, Vitamin D3, aspirin EC, blood glucose meter kit and supplies, ALPRAZolam, citalopram, rosuvastatin, pantoprazole, glimepiride, fluticasone, levocetirizine, OneTouch Delica Lancets 74J, OneTouch Verio, Ozempic (0.25 or 0.5 MG/DOSE), levothyroxine, azelastine, cyclobenzaprine, and losartan. We administered ketorolac.  Meds ordered this encounter  Medications  . cyclobenzaprine (FLEXERIL) 10 MG tablet    Sig: Take 1 tablet (10 mg total) by mouth 3 (three) times daily as needed for muscle spasms.    Dispense:  30 tablet    Refill:  0  . meloxicam (MOBIC) 15 MG tablet    Sig: Take 1 tablet (15 mg total) by mouth daily.    Dispense:  30 tablet    Refill:  0  . ketorolac (TORADOL) injection 60 mg    Problem List Items Addressed This Visit      Unprioritized   Acute nonintractable headache    Hx migraines---- muscle relaxer and mobic prn  Pt has been under a lot of stress --- also may be from bp  Pt did not want to inc meds right now -- she wants to see if pain meds/ muscle relaxers help  toradol given in office also  F/u 2-3 weeks or sooner prn       Relevant Medications   cyclobenzaprine (FLEXERIL) 10 MG tablet   meloxicam (MOBIC) 15 MG tablet   Acute pain of left shoulder    Worsening pain       Relevant Medications   meloxicam (MOBIC) 15 MG tablet   Other Relevant Orders   Ambulatory referral to Sports Medicine   De Quervain's tenosynovitis, left    Pt to wear splint  mobic  Refer to sport med due to her having difficulty working       Relevant Medications   meloxicam (MOBIC) 15 MG tablet   Other Relevant  Orders   Ambulatory referral to Sports Medicine   Left wrist pain - Primary   Relevant Orders   Ambulatory referral to Sports Medicine      Follow-up: Return if symptoms worsen or fail to improve, for  hypertension.  [name], DO

## 2019-08-11 NOTE — Assessment & Plan Note (Signed)
Pt to wear splint  mobic  Refer to sport med due to her having difficulty working

## 2019-08-11 NOTE — Patient Instructions (Signed)
General Headache Without Cause A headache is pain or discomfort felt around the head or neck area. The specific cause of a headache may not be found. There are many causes and types of headaches. A few common ones are:  Tension headaches.  Migraine headaches.  Cluster headaches.  Chronic daily headaches. Follow these instructions at home: Watch your condition for any changes. Let your health care provider know about them. Take these steps to help with your condition: Managing pain      Take over-the-counter and prescription medicines only as told by your health care provider.  Lie down in a dark, quiet room when you have a headache.  If directed, put ice on your head and neck area: ? Put ice in a plastic bag. ? Place a towel between your skin and the bag. ? Leave the ice on for 20 minutes, 2-3 times per day.  If directed, apply heat to the affected area. Use the heat source that your health care provider recommends, such as a moist heat pack or a heating pad. ? Place a towel between your skin and the heat source. ? Leave the heat on for 20-30 minutes. ? Remove the heat if your skin turns bright red. This is especially important if you are unable to feel pain, heat, or cold. You may have a greater risk of getting burned.  Keep lights dim if bright lights bother you or make your headaches worse. Eating and drinking  Eat meals on a regular schedule.  If you drink alcohol: ? Limit how much you use to:  0-1 drink a day for women.  0-2 drinks a day for men. ? Be aware of how much alcohol is in your drink. In the [place] one drink equals one 12 oz bottle of beer (355 mL), one 5 oz glass of wine (148 mL), or one 1 oz glass of hard liquor (44 mL).  Stop drinking caffeine, or decrease the amount of caffeine you drink. General instructions   Keep a headache journal to help find out what may trigger your headaches. For example, write down: ? What you eat and drink. ? How much  sleep you get. ? Any change to your diet or medicines.  Try massage or other relaxation techniques.  Limit stress.  Sit up straight, and do not tense your muscles.  Do not use any products that contain nicotine or tobacco, such as cigarettes, e-cigarettes, and chewing tobacco. If you need help quitting, ask your health care provider.  Exercise regularly as told by your health care provider.  Sleep on a regular schedule. Get 7-9 hours of sleep each night, or the amount recommended by your health care provider.  Keep all follow-up visits as told by your health care provider. This is important. Contact a health care provider if:  Your symptoms are not helped by medicine.  You have a headache that is different from the usual headache.  You have nausea or you vomit.  You have a fever. Get help right away if:  Your headache becomes severe quickly.  Your headache gets worse after moderate to intense physical activity.  You have repeated vomiting.  You have a stiff neck.  You have a loss of vision.  You have problems with speech.  You have pain in the eye or ear.  You have muscular weakness or loss of muscle control.  You lose your balance or have trouble walking.  You feel faint or pass out.  You have confusion.    You have a seizure. Summary  A headache is pain or discomfort felt around the head or neck area.  There are many causes and types of headaches. In some cases, the cause may not be found.  Keep a headache journal to help find out what may trigger your headaches. Watch your condition for any changes. Let your health care provider know about them.  Contact a health care provider if you have a headache that is different from the usual headache, or if your symptoms are not helped by medicine.  Get help right away if your headache becomes severe, you vomit, you have a loss of vision, you lose your balance, or you have a seizure. This information is not  intended to replace advice given to you by your health care provider. Make sure you discuss any questions you have with your health care provider. Document Revised: 12/30/2017 Document Reviewed: 12/30/2017 Elsevier Patient Education  2020 [employer]  

## 2019-08-11 NOTE — Assessment & Plan Note (Signed)
Worsening pain

## 2019-08-11 NOTE — Assessment & Plan Note (Addendum)
Hx migraines---- muscle relaxer and mobic prn  Pt has been under a lot of stress --- also may be from bp  Pt did not want to inc meds right now -- she wants to see if pain meds/ muscle relaxers help  toradol given in office also  F/u 2-3 weeks or sooner prn

## 2019-08-12 ENCOUNTER — Encounter (HOSPITAL_BASED_OUTPATIENT_CLINIC_OR_DEPARTMENT_OTHER): Payer: Self-pay | Admitting: *Deleted

## 2019-08-12 ENCOUNTER — Emergency Department (HOSPITAL_BASED_OUTPATIENT_CLINIC_OR_DEPARTMENT_OTHER)
Admission: EM | Admit: 2019-08-12 | Discharge: 2019-08-12 | Disposition: A | Discharge status: Home / Self Care | Payer: BLUE CROSS/BLUE SHIELD | Attending: Emergency Medicine | Admitting: Emergency Medicine

## 2019-08-12 ENCOUNTER — Telehealth: Payer: Self-pay

## 2019-08-12 ENCOUNTER — Telehealth: Payer: Self-pay | Admitting: Family Medicine

## 2019-08-12 ENCOUNTER — Other Ambulatory Visit: Payer: Self-pay

## 2019-08-12 DIAGNOSIS — Z888 Allergy status to other drugs, medicaments and biological substances status: Secondary | ICD-10-CM | POA: Diagnosis not present

## 2019-08-12 DIAGNOSIS — E039 Hypothyroidism, unspecified: Secondary | ICD-10-CM | POA: Diagnosis not present

## 2019-08-12 DIAGNOSIS — G44209 Tension-type headache, unspecified, not intractable: Secondary | ICD-10-CM | POA: Diagnosis not present

## 2019-08-12 DIAGNOSIS — I10 Essential (primary) hypertension: Secondary | ICD-10-CM

## 2019-08-12 DIAGNOSIS — Z79899 Other long term (current) drug therapy: Secondary | ICD-10-CM | POA: Insufficient documentation

## 2019-08-12 DIAGNOSIS — E785 Hyperlipidemia, unspecified: Secondary | ICD-10-CM | POA: Diagnosis not present

## 2019-08-12 DIAGNOSIS — R519 Headache, unspecified: Secondary | ICD-10-CM | POA: Diagnosis not present

## 2019-08-12 DIAGNOSIS — Z7982 Long term (current) use of aspirin: Secondary | ICD-10-CM | POA: Insufficient documentation

## 2019-08-12 DIAGNOSIS — Z7984 Long term (current) use of oral hypoglycemic drugs: Secondary | ICD-10-CM | POA: Insufficient documentation

## 2019-08-12 DIAGNOSIS — E119 Type 2 diabetes mellitus without complications: Secondary | ICD-10-CM | POA: Diagnosis not present

## 2019-08-12 MED ORDER — KETOROLAC TROMETHAMINE 15 MG/ML IJ SOLN
15.0000 mg | Freq: Once | INTRAMUSCULAR | Status: AC
  Administered 2019-08-12: 15 mg via INTRAVENOUS
  Filled 2019-08-12: qty 1

## 2019-08-12 MED ORDER — LOSARTAN POTASSIUM 50 MG PO TABS: 50.0000 mg | ORAL_TABLET | Freq: Every day | ORAL | 2 refills | Status: DC

## 2019-08-12 MED ORDER — METOCLOPRAMIDE HCL 5 MG/ML IJ SOLN
10.0000 mg | Freq: Once | INTRAMUSCULAR | Status: AC
  Administered 2019-08-12: 10 mg via INTRAVENOUS
  Filled 2019-08-12: qty 2

## 2019-08-12 MED ORDER — SODIUM CHLORIDE 0.9 % IV BOLUS
1000.0000 mL | Freq: Once | INTRAVENOUS | Status: AC
  Administered 2019-08-12: 1000 mL via INTRAVENOUS

## 2019-08-12 MED ORDER — DIAZEPAM 2 MG PO TABS
2.0000 mg | ORAL_TABLET | Freq: Once | ORAL | Status: AC
  Administered 2019-08-12: 2 mg via ORAL
  Filled 2019-08-12: qty 1

## 2019-08-12 NOTE — ED Triage Notes (Signed)
Pt c/o h/a and increased BP x 2 days , sent by PMD

## 2019-08-12 NOTE — Telephone Encounter (Signed)
Left VM with instructions. New Rx sent in. Advised patient that we closing office tomorrow and to call back today with any questions.

## 2019-08-12 NOTE — ED Provider Notes (Signed)
[facility] DEPART[facility] Provider Note   CSN: [id number] Arrival date & time: 08/12/19  1455     History Chief Complaint  Patient presents with  . Hypertension    Felicia Solis is a 54 y.o. female presenting to the emergency department with headache and hypertension.  The patient reports a hx of migraines and headaches, but feels this has worsened in the past 3 days.  She describes right sided headache for 2-3 days and is concerned that her blood pressure has been more elevated than normal.  She denies current visual changes.  She reports neck pain that is acute on chronic.  She denies falls or trauma.  She reports some nausea but no vomiting.  Denies numbness or weakness.  Denies fevers or chills.  Her PCP Dr Cheri Rous yesterday increased her home losartan dose to 50 mg daily.  HPI     Past Medical History:  Diagnosis Date  . Anemia    anemia  when pregnant in 2002  . Anxiety   . Arthritis    "knees and back" (09/28/2016)  . Asthma 5 yrs ago    told she had asthma.. does not remember doctors name.   . Chronic lower back pain   . Depression    takes citalopram  . History of hiatal hernia   . History of kidney stones   . Hyperlipidemia   . Hypothyroidism   . LBBB (left bundle branch block)    rate-related; identified on stress test 03/21/16  . Migraine    "weekly" (09/28/2016)  . 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 Shortness of breath    with exertion  . Thyroid disease   . Type II diabetes mellitus ([place]) dx'd 2016    Patient Active Problem List   Diagnosis Date Noted  . Left wrist pain 08/11/2019  . Acute pain of left shoulder 08/11/2019  . Acute nonintractable headache 08/11/2019  . De Quervain's tenosynovitis, left 08/11/2019  . Vertigo 06/09/2019  . Right acute serous otitis media 06/09/2019  . Impacted cerumen of left ear 06/09/2019  . Pelvic pain 02/23/2019  . HNP (herniated nucleus  pulposus), cervical 01/04/2019  . Epigastric mass 10/21/2018  . Myalgia 03/18/2018  . Hyperlipidemia associated with type 2 diabetes mellitus ([place]) 03/18/2018  . Nausea and vomiting 12/02/2017  . Hematuria 12/02/2017  . Pansinusitis 11/19/2017  . 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 Preventative health care 09/27/2017  . Hyperlipidemia LDL goal <100 09/18/2017  . Vitamin D deficiency 09/18/2017  . Hypothyroidism 09/18/2017  . Other intervertebral disc degeneration, lumbar region 04/07/2017  . Left hip pain 04/07/2017  . Hyperlipidemia LDL goal <70 04/07/2017  . Essential hypertension 04/07/2017  . Acute vaginitis 04/07/2017  . S/P complete thyroidectomy 09/28/2016  . Memory loss 08/23/2016  . Thyroid nodule 08/02/2016  . Plantar fasciitis of right foot 12/01/2015  . Knee pain, right 12/01/2015  . Headache, migraine 09/06/2015  . Bilateral knee pain 08/22/2015  . Thoracic myofascial strain 08/22/2015  . Dysphagia 04/10/2015  . Atypical chest pain 04/10/2015  . Epigastric pain 04/10/2015  . Functional diarrhea 04/10/2015  . NSAID long-term use 04/10/2015  . Midepigastric pain 03/22/2015  . Cough 01/07/2015  . Allergic rhinitis 12/13/2014  . Acute bronchitis 12/13/2014  . Blood in stool 10/01/2014  . DM (diabetes mellitus) type II uncontrolled, periph vascular disorder ([place]) 08/13/2014  . Abnormal ECG 05/14/2012  . Breast  pain 10/23/2011  . ASCUS on Pap smear 10/08/2011  . H/O thyroid nodule 08/27/2011  . Depression with anxiety 08/27/2011  . Heavy periods 08/27/2011    Past Surgical History:  Procedure Laterality Date  . DILATION AND CURETTAGE OF UTERUS  1999  . ENDOMETRIAL ABLATION    . EXTRACORPOREAL SHOCK WAVE LITHOTRIPSY Right 04/02/2019   Procedure: EXTRACORPOREAL SHOCK WAVE LITHOTRIPSY (ESWL);  Surgeon: Ceasar Mons, MD;  Location: [facility];  Service: Urology;  Laterality: Right;  . LAPAROSCOPY     years ago ..pt does not know where surgery was  done ... states" my stomach would blow up then go down"  . THYROIDECTOMY  09/28/2011   Procedure: THYROIDECTOMY;  Surgeon: Izora Gala, MD;  Location: [facility];  Service: ENT;  Laterality: Right;  RIGHT THYROIDECTOMY WITH FROZEN SECTION  . THYROIDECTOMY  09/28/2016   completion of thyroidectomy/notes 09/28/2016  . THYROIDECTOMY Left 09/28/2016   Procedure: COMPLETION OF THYROIDECTOMY;  Surgeon: Izora Gala, MD;  Location: [facility];  Service: ENT;  Laterality: Left;  . TUBAL LIGATION       OB History    Gravida  5   Para  3   Term  3   Preterm      AB  2   Living  3     SAB  2   TAB      Ectopic      Multiple      Live Births              Family History  Problem Relation Age of Onset  . Diabetes Mother   . Hypertension Mother   . Hyperlipidemia Mother   . Gallbladder disease Mother   . Heart disease Mother   . Diabetes Sister   . Hypertension Father   . Hyperlipidemia Father   . Colon polyps Father   . Ovarian cancer Maternal Grandmother   . Gallbladder disease Maternal Grandmother   . Breast cancer Maternal Grandmother 40  . Colon cancer Paternal Grandfather   . Breast cancer Maternal Aunt        late 33s  . Heart disease Other        early cad  . Diabetes Brother   . Heart attack Brother   . Anesthesia problems Neg Hx   . Hypotension Neg Hx   . Malignant hyperthermia Neg Hx   . Pseudochol deficiency Neg Hx   . Esophageal cancer Neg Hx     Social History   Tobacco Use  . Smoking status: Never Smoker  . Smokeless tobacco: Never Used  Substance Use Topics  . Alcohol use: No    Alcohol/week: 0.0 standard drinks  . Drug use: No    Home Medications Prior to Admission medications   Medication Sig Start Date End Date Taking? Authorizing Provider  acetaminophen (TYLENOL) 500 MG tablet Take 1,000 mg by mouth every 6 (six) hours as needed for mild pain.    [provider]  ALPRAZolam Duanne Moron) 0.5 MG tablet Take 1 tablet (0.5 mg total) by mouth 3  (three) times daily as needed for anxiety. 11/19/17   [name], DO  aspirin EC 81 MG tablet Take 1 tablet (81 mg total) by mouth daily. 03/07/16   End, Harrell Gave, MD  azelastine (ASTELIN) 0.1 % nasal spray Place 1 spray into both nostrils 2 (two) times daily. Use in each nostril as directed 05/29/19   Carollee Herter, Kendrick Fries R, DO  blood glucose meter kit and supplies  KIT Dispense based on patient and insurance preference. Use up to four times daily as directed. (FOR ICD-9 250.00, 250.01). 09/17/17   Carollee Herter, Alferd Apa, DO  Cholecalciferol (VITAMIN D3) 2000 units TABS Take 2,000 Units by mouth daily.     [provider]  citalopram (CELEXA) 20 MG tablet Take 1 tablet (20 mg total) by mouth daily. 08/28/18   [name], DO  cyanocobalamin 500 MCG tablet Take 1,000 mcg by mouth daily.     [provider]  cyclobenzaprine (FLEXERIL) 10 MG tablet Take 1 tablet (10 mg total) by mouth at bedtime as needed for muscle spasms. 06/25/19   Colon Branch, MD  cyclobenzaprine (FLEXERIL) 10 MG tablet Take 1 tablet (10 mg total) by mouth 3 (three) times daily as needed for muscle spasms. 08/11/19   Roma Schanz R, DO  Flaxseed, Linseed, (FLAXSEED OIL PO) Take 1,300 mg by mouth daily.    [provider]  fluticasone (FLONASE) 50 MCG/ACT nasal spray Place 2 sprays into both nostrils daily. 01/26/19   [name], DO  Ginkgo Biloba Extract 60 MG CAPS Take 60 mg by mouth daily.    [provider]  glimepiride (AMARYL) 2 MG tablet Take 1 tablet (2 mg total) by mouth daily before breakfast. 10/07/18   Carollee Herter, Alferd Apa, DO  glucose blood (ONETOUCH VERIO) test strip [facility] Check blood sugar twice daily 03/12/19   Carollee Herter, Alferd Apa, DO  levocetirizine (XYZAL) 5 MG tablet Take 1 tablet (5 mg total) by mouth every evening. 01/26/19   [name], DO  levothyroxine (SYNTHROID) 88 MCG tablet Take 1 tablet (88 mcg total) by mouth daily.  05/27/19   [name], DO  losartan (COZAAR) 50 MG tablet Take 1 tablet (50 mg total) by mouth daily. 08/12/19   [name], DO  meloxicam (MOBIC) 15 MG tablet Take 1 tablet (15 mg total) by mouth daily. 08/11/19   [name], DO  Omega-3 Fatty Acids (FISH OIL) 1200 MG CAPS Take 1,200 mg by mouth daily.    [provider]  OneTouch Delica Lancets 32D MISC 1 Device by Does not apply route 2 (two) times daily as needed. 03/12/19   [name], DO  pantoprazole (PROTONIX) 40 MG tablet Take 1 tablet (40 mg total) by mouth daily. 08/28/18   Roma Schanz R, DO  rosuvastatin (CRESTOR) 20 MG tablet Take 1 tablet (20 mg total) by mouth daily. 08/28/18   [name], DO  Semaglutide,0.25 or 0.5MG/DOS, (OZEMPIC, 0.25 OR 0.5 MG/DOSE,) 2 MG/1.5ML SOPN Inject 0.25 mg into the skin once a week. 05/25/19   [name], DO    Allergies    Lisinopril  Review of Systems   Review of Systems  Constitutional: Negative for chills and fever.  Respiratory: Negative for cough and shortness of breath.   Gastrointestinal: Positive for nausea. Negative for abdominal pain and vomiting.  Musculoskeletal: Positive for neck pain and neck stiffness.  Skin: Negative for pallor and rash.  Neurological: Positive for light-headedness and headaches. Negative for seizures, syncope, facial asymmetry, speech difficulty, weakness and numbness.  Psychiatric/Behavioral: Negative for agitation and confusion.  All other systems reviewed and are negative.   Physical Exam Updated Vital Signs BP 133/88 (BP Location: Left Arm)   Pulse 74   Temp 98.5 F (36.9 C) (Oral)   Resp 16   Ht 5' 6" (1.676  m)   Wt 101 kg   SpO2 100%   BMI 35.94 kg/m   Physical Exam Vitals and nursing note reviewed.  Constitutional:      General: She is not in acute distress.    Appearance: She is well-developed.  HENT:     Head: Normocephalic and atraumatic.  Eyes:     General:  No visual field deficit.    Extraocular Movements: Extraocular movements intact.     Conjunctiva/sclera: Conjunctivae normal.     Pupils: Pupils are equal, round, and reactive to light.     Comments: No photophobia  Neck:     Comments: Full ROM of the neck Large palpable knots in right trapezius near base of neck and insertion point in back of neck, trigger point tenderness Cardiovascular:     Rate and Rhythm: Normal rate and regular rhythm.     Pulses: Normal pulses.  Pulmonary:     Effort: Pulmonary effort is normal. No respiratory distress.     Breath sounds: Normal breath sounds.  Abdominal:     Palpations: Abdomen is soft.     Tenderness: There is no abdominal tenderness.  Musculoskeletal:     Cervical back: Normal range of motion and neck supple.  Skin:    General: Skin is warm and dry.  Neurological:     General: No focal deficit present.     Mental Status: She is alert and oriented to person, place, and time.     GCS: GCS eye subscore is 4. GCS verbal subscore is 5. GCS motor subscore is 6.     Cranial Nerves: Cranial nerves are intact. No cranial nerve deficit, dysarthria or facial asymmetry.     Sensory: Sensation is intact.     Motor: Motor function is intact.     Coordination: Coordination is intact.     Gait: Gait is intact.     ED Results / Procedures / Treatments   Labs (all labs ordered are listed, but only abnormal results are displayed) Labs Reviewed - No data to display  EKG None  Radiology No results found.  Procedures Procedures (including critical care time)  Medications Ordered in ED Medications  diazepam (VALIUM) tablet 2 mg (2 mg Oral Given 08/12/19 1646)  metoCLOPramide (REGLAN) injection 10 mg (10 mg Intravenous Given 08/12/19 1646)  ketorolac (TORADOL) 15 MG/ML injection 15 mg (15 mg Intravenous Given 08/12/19 1646)  sodium chloride 0.9 % bolus 1,000 mL (0 mLs Intravenous Stopped 08/12/19 1755)    ED Course  I have reviewed the triage  vital signs and the nursing notes.  Pertinent labs & imaging results that were available during my care of the patient were reviewed by me and considered in my medical decision making (see chart for details).  54 yo female presenting to the ED with headache x several days, as well as high BP.  She has a benign neurovascular exam.  I suspect this may be a migraine headache (she has a history of these), and her pain may be inducing her hypertension.  We'll try a migraine cocktail and reassess her pain and BP afterwards.  She has a benign neurological exam, and I do not suspect this is a stroke or ICH.  I do not believe she needs CT imaging.  I have a very low suspicion for meningitis.  She has no fever, photophobia, or true neck stiffness.  Her neck pain is related to her significant muscle tension and trigger point pain on exam, which may also  be contributing to her "right sided" headache.  We'll try a small dose of valium, but I discussed this muscle stress and tension may be affecting her headaches. Try icy hot or muscle rub at home.    Clinical Course as of Aug 13 1231  Wed Aug 12, 2019  1848 Feeling much better, will discharge   [MT]    Clinical Course User Index [MT] Trifan, Carola Rhine, MD    Final Clinical Impression(s) / ED Diagnoses Final diagnoses:  Hypertension, unspecified type  Nonintractable headache, unspecified chronicity pattern, unspecified headache type    Rx / DC Orders ED Discharge Orders    None       Wyvonnia Dusky, MD 08/13/19 1233

## 2019-08-12 NOTE — Telephone Encounter (Signed)
Pt called wanting a note to be written out of for a few days due to the migraine that was discussed at the yesterday. Please advise

## 2019-08-12 NOTE — Telephone Encounter (Signed)
Please advise 

## 2019-08-12 NOTE — Telephone Encounter (Signed)
Increase losartan 50 mg daily  #30  2 refills  F/u 2-3 weeks

## 2019-08-12 NOTE — Telephone Encounter (Signed)
Pt states he headaches has gotten worse today. She was told to call back if her BP was high and if she still has an headache to call back and ask for meds .  BP  Reading ( 162/98 101/105 155/100)  Please call back to advise

## 2019-08-13 ENCOUNTER — Ambulatory Visit: Payer: BLUE CROSS/BLUE SHIELD | Admitting: Cardiology

## 2019-08-13 ENCOUNTER — Encounter: Payer: Self-pay | Admitting: Family Medicine

## 2019-08-13 NOTE — Telephone Encounter (Signed)
Note is in mychart

## 2019-08-14 ENCOUNTER — Encounter: Payer: Self-pay | Admitting: Family Medicine

## 2019-08-14 ENCOUNTER — Other Ambulatory Visit: Payer: Self-pay

## 2019-08-14 ENCOUNTER — Ambulatory Visit (INDEPENDENT_AMBULATORY_CARE_PROVIDER_SITE_OTHER): Payer: BLUE CROSS/BLUE SHIELD | Admitting: Family Medicine

## 2019-08-14 VITALS — BP 170/99 | Ht 66.0 in

## 2019-08-14 DIAGNOSIS — I10 Essential (primary) hypertension: Secondary | ICD-10-CM | POA: Diagnosis not present

## 2019-08-14 MED ORDER — LOSARTAN POTASSIUM 100 MG PO TABS: 100.0000 mg | ORAL_TABLET | Freq: Every day | ORAL | 3 refills | Status: DC

## 2019-08-14 NOTE — Patient Instructions (Signed)
Vaginitis Vaginitis is a condition in which the vaginal tissue swells and becomes red (inflamed). This condition is most often caused by a change in the normal balance of bacteria and yeast that live in the vagina. This change causes an overgrowth of certain bacteria or yeast, which causes the inflammation. There are different types of vaginitis, but the most common types are:  Bacterial vaginosis.  Yeast infection (candidiasis).  Trichomoniasis vaginitis. This is a sexually transmitted disease (STD).  Viral vaginitis.  Atrophic vaginitis.  Allergic vaginitis. What are the causes? The cause of this condition depends on the type of vaginitis. It can be caused by:  Bacteria (bacterial vaginosis).  Yeast, which is a fungus (yeast infection).  A parasite (trichomoniasis vaginitis).  A virus (viral vaginitis).  Low hormone levels (atrophic vaginitis). Low hormone levels can occur during pregnancy, breastfeeding, or after menopause.  Irritants, such as bubble baths, scented tampons, and feminine sprays (allergic vaginitis). Other factors can change the normal balance of the yeast and bacteria that live in the vagina. These include:  Antibiotic medicines.  Poor hygiene.  Diaphragms, vaginal sponges, spermicides, birth control pills, and intrauterine devices (IUD).  Sex.  Infection.  Uncontrolled diabetes.  A weakened defense (immune) system. What increases the risk? This condition is more likely to develop in women who:  Smoke.  Use vaginal douches, scented tampons, or scented sanitary pads.  Wear tight-fitting pants.  Wear thong underwear.  Use oral birth control pills or an IUD.  Have sex without a condom.  Have multiple sex partners.  Have an STD.  Frequently use the spermicide nonoxynol-9.  Eat lots of foods high in sugar.  Have uncontrolled diabetes.  Have low estrogen levels.  Have a weakened immune system from an immune disorder or medical  treatment.  Are pregnant or breastfeeding. What are the signs or symptoms? Symptoms vary depending on the cause of the vaginitis. Common symptoms include:  Abnormal vaginal discharge. ? The discharge is white, gray, or yellow with bacterial vaginosis. ? The discharge is thick, white, and cheesy with a yeast infection. ? The discharge is frothy and yellow or greenish with trichomoniasis.  A bad vaginal smell. The smell is fishy with bacterial vaginosis.  Vaginal itching, pain, or swelling.  Sex that is painful.  Pain or burning when urinating. Sometimes there are no symptoms. How is this diagnosed? This condition is diagnosed based on your symptoms and medical history. A physical exam, including a pelvic exam, will also be done. You may also have other tests, including:  Tests to determine the pH level (acidity or alkalinity) of your vagina.  A whiff test, to assess the odor that results when a sample of your vaginal discharge is mixed with a potassium hydroxide solution.  Tests of vaginal fluid. A sample will be examined under a microscope. How is this treated? Treatment varies depending on the type of vaginitis you have. Your treatment may include:  Antibiotic creams or pills to treat bacterial vaginosis and trichomoniasis.  Antifungal medicines, such as vaginal creams or suppositories, to treat a yeast infection.  Medicine to ease discomfort if you have viral vaginitis. Your sexual partner should also be treated.  Estrogen delivered in a cream, pill, suppository, or vaginal ring to treat atrophic vaginitis. If vaginal dryness occurs, lubricants and moisturizing creams may help. You may need to avoid scented soaps, sprays, or douches.  Stopping use of a product that is causing allergic vaginitis. Then using a vaginal cream to treat the symptoms. Follow   these instructions at home: Lifestyle  Keep your genital area clean and dry. Avoid soap, and only rinse the area with  water.  Do not douche or use tampons until your health care provider says it is okay to do so. Use sanitary pads, if needed.  Do not have sex until your health care provider approves. When you can return to sex, practice safe sex and use condoms.  Wipe from front to back. This avoids the spread of bacteria from the rectum to the vagina. General instructions  Take over-the-counter and prescription medicines only as told by your health care provider.  If you were prescribed an antibiotic medicine, take or use it as told by your health care provider. Do not stop taking or using the antibiotic even if you start to feel better.  Keep all follow-up visits as told by your health care provider. This is important. How is this prevented?  Use mild, non-scented products. Do not use things that can irritate the vagina, such as fabric softeners. Avoid the following products if they are scented: ? Feminine sprays. ? Detergents. ? Tampons. ? Feminine hygiene products. ? Soaps or bubble baths.  Let air reach your genital area. ? Wear cotton underwear to reduce moisture buildup. ? Avoid wearing underwear while you sleep. ? Avoid wearing tight pants and underwear or nylons without a cotton panel. ? Avoid wearing thong underwear.  Take off any wet clothing, such as bathing suits, as soon as possible.  Practice safe sex and use condoms. Contact a health care provider if:  You have abdominal pain.  You have a fever.  You have symptoms that last for more than 2-3 days. Get help right away if:  You have a fever and your symptoms suddenly get worse. Summary  Vaginitis is a condition in which the vaginal tissue becomes inflamed.This condition is most often caused by a change in the normal balance of bacteria and yeast that live in the vagina.  Treatment varies depending on the type of vaginitis you have.  Do not douche, use tampons , or have sex until your health care provider approves. When  you can return to sex, practice safe sex and use condoms. This information is not intended to replace advice given to you by your health care provider. Make sure you discuss any questions you have with your health care provider. Document Revised: 05/24/2017 Document Reviewed: 07/17/2016 Elsevier Patient Education  2020 [employer]  

## 2019-08-14 NOTE — Progress Notes (Signed)
Virtual Visit via Video Note  I connected with Felicia Solis on 08/14/19 at  2:00 PM EST by a video enabled telemedicine application and verified that I am speaking with the correct person using two identifiers.  Location: Patient: home alone Provider: office    I discussed the limitations of evaluation and management by telemedicine and the availability of in person appointments. The patient expressed understanding and agreed to proceed.  History of Present Illness: Pt is home c/o bp con't to be elevated and headache She has an app with [facility] Monday for her arm/ hand    Observations/Objective: Vitals:   08/14/19 1351  BP: (!) 170/99  pt is in nad  Assessment and Plan: 1. Essential hypertension Poorly controlled will alter medications, encouraged DASH diet, minimize caffeine and obtain adequate sleep. Report concerning symptoms and follow up as directed and as needed - losartan (COZAAR) 100 MG tablet; Take 1 tablet (100 mg total) by mouth daily.  Dispense: 90 tablet; Refill: 3 - US Renal; Future   Follow Up Instructions:    I discussed the assessment and treatment plan with the patient. The patient was provided an opportunity to ask questions and all were answered. The patient agreed with the plan and demonstrated an understanding of the instructions.   The patient was advised to call back or seek an in-person evaluation if the symptoms worsen or if the condition fails to improve as anticipated.  I provided 25 minutes of non-face-to-face time during this encounter.   [name], DO

## 2019-08-17 ENCOUNTER — Other Ambulatory Visit: Payer: Self-pay

## 2019-08-17 ENCOUNTER — Ambulatory Visit (INDEPENDENT_AMBULATORY_CARE_PROVIDER_SITE_OTHER): Payer: BLUE CROSS/BLUE SHIELD | Admitting: Family Medicine

## 2019-08-17 ENCOUNTER — Encounter: Payer: Self-pay | Admitting: Family Medicine

## 2019-08-17 DIAGNOSIS — R42 Dizziness and giddiness: Secondary | ICD-10-CM | POA: Diagnosis not present

## 2019-08-17 DIAGNOSIS — M654 Radial styloid tenosynovitis [de Quervain]: Secondary | ICD-10-CM | POA: Diagnosis not present

## 2019-08-17 DIAGNOSIS — S46812A Strain of other muscles, fascia and tendons at shoulder and upper arm level, left arm, initial encounter: Secondary | ICD-10-CM | POA: Insufficient documentation

## 2019-08-17 DIAGNOSIS — G43719 Chronic migraine without aura, intractable, without status migrainosus: Secondary | ICD-10-CM | POA: Diagnosis not present

## 2019-08-17 NOTE — Assessment & Plan Note (Signed)
deQuervain's tenosynovitis of L thumb/wrist.  Positive Finkelstein.  No concern for scaphoid etiology given no tenderness of snuff box.  Avoid painful activities as much as possible.  No concern for [facility] OA or wrist OA given exam findings. -Patient to wear the thumb spica brace as often as possible to rest this. -encouraged to ice 15 minutes at a time 3-4 times a day. -will trial naproxen 500mg  twice a day with food for pain and inflammation, and she may decrease to prn once pain improved. -Follow up in 1 month for reevaluation and may consider cortisone injection at that time if pain not significantly improved. -may also consider PT for iontophoresis if no improvement.

## 2019-08-17 NOTE — Progress Notes (Signed)
   HPI  CC: Wrist and shoulder pain  Left wrist pain 54 year old female who reports about 2 weeks of left wrist pain.  States has gotten worse over the past week.  She denies any known trauma or falls.  She states that she has noticed some swelling along her wrist on the radial side.  States that the pain will sometimes radiate to the end of her thumb.  She denies any numbness or tingling.  She reports that she has not really tried any interventions yet.  Left shoulder pain Patient reports that she has had about 1 week of left shoulder pain.  States that it is located on the anterior portion of her shoulder.  Pain is worse when she lifts her arm in abduction.  She reports that she has chronic neck pain but this feels different.  She denies any numbness or tingling down her arm.  She reports that she is still able to work through the pain.  See HPI and/or previous note for associated ROS.  Objective: BP 130/86   Ht 5\' 6"  (1.676 m)   Wt 220 lb (99.8 kg)   BMI 35.51 kg/m  Gen: R-Hand Dominant. NAD, well groomed, a/o x3, normal affect.  CV: Well-perfused. Warm.  Resp: Non-labored.  Neuro: Sensation intact throughout. No gross coordination deficits.  Gait: Nonpathologic posture  Shoulder, L:Inspection reveals no abnormalities, atrophy or asymmetry. TTP over trapezius muscle.  Palpation normal with no tenderness over AC joint or bicipital groove. ROM is full in all planes. Rotator cuff strength normal throughout. No signs of impingement with negative Neer and Hawkin's tests, empty can sign. Speeds and Yergason's tests normal. No labral pathology noted with negative Obrien's, good stability. Negative Spurling's Normal scapular function observed.  Left wrist/hand: No deformity noted. FROM with 5/5 strength digits and wrist. Mild tenderness to palpation radial head.  Not TTP over [facility].  No tenderness of snuffbox. NVI distally. Negative tinels, phalens. Positive Finkelstein.  Right  wrist/hand: No deformity noted. FROM with 5/5 strength digits and wrist. No TTP. NVI distally.  Assessment and plan:  Trapezius muscle strain, left, initial encounter History and exam c/w trapezius spasms/strain. No concern for rotator cuff etiology given exam findings. Spurling's negative and less concern for neck etiology.  -Given home exercises/stretches for this. -Instructed to apply heat 15 minutes at a time 3-4 times a day. -Patient encouraged to trial massage as well.   De Quervain's tenosynovitis, left deQuervain's tenosynovitis of L thumb/wrist.  Positive Finkelstein.  No concern for scaphoid etiology given no tenderness of snuff box.  Avoid painful activities as much as possible.  No concern for [facility] OA or wrist OA given exam findings. -Patient to wear the thumb spica brace as often as possible to rest this. -encouraged to ice 15 minutes at a time 3-4 times a day. -will trial naproxen 500mg  twice a day with food for pain and inflammation, and she may decrease to prn once pain improved. -Follow up in 1 month for reevaluation and may consider cortisone injection at that time if pain not significantly improved. -may also consider PT for iontophoresis if no improvement.   Martinique Kahiau Schewe, DO PGY-3, [facility]  08/17/2019 11:57 AM

## 2019-08-17 NOTE — Patient Instructions (Signed)
You have deQuervain's tenosynovitis of your thumb/wrist. Avoid painful activities as much as possible. Wear the thumb spica brace as often as possible to rest this. Ice 15 minutes at a time 3-4 times a day. Naproxen 500mg  twice a day with food for pain and inflammation - can back down to taking as needed when you feel better. A cortisone injection is an option. Physical therapy for iontophoresis is another consideration. Follow up with me in 1 month for reevaluation.  You also have trapezius spasms/strain. Do home exercises/stretches for this. Heat 15 minutes at a time 3-4 times a day. A massage would be helpful as well.

## 2019-08-17 NOTE — Assessment & Plan Note (Signed)
History and exam c/w trapezius spasms/strain. No concern for rotator cuff etiology given exam findings. Spurling's negative and less concern for neck etiology.  -Given home exercises/stretches for this. -Instructed to apply heat 15 minutes at a time 3-4 times a day. -Patient encouraged to trial massage as well.

## 2019-08-18 ENCOUNTER — Ambulatory Visit (HOSPITAL_BASED_OUTPATIENT_CLINIC_OR_DEPARTMENT_OTHER)
Admission: RE | Admit: 2019-08-18 | Discharge: 2019-08-18 | Disposition: A | Discharge status: Home / Self Care | Payer: BLUE CROSS/BLUE SHIELD | Source: Ambulatory Visit | Attending: Family Medicine | Admitting: Family Medicine

## 2019-08-18 DIAGNOSIS — I10 Essential (primary) hypertension: Secondary | ICD-10-CM

## 2019-08-19 MED ORDER — NAPROXEN 500 MG PO TABS: 500.0000 mg | ORAL_TABLET | Freq: Two times a day (BID) | ORAL | 1 refills | Status: DC | PRN

## 2019-08-19 NOTE — Addendum Note (Signed)
Addended by: Jolinda Croak E on: 08/19/2019 02:30 PM   Modules accepted: Orders

## 2019-08-31 ENCOUNTER — Other Ambulatory Visit: Payer: Self-pay

## 2019-08-31 ENCOUNTER — Ambulatory Visit (INDEPENDENT_AMBULATORY_CARE_PROVIDER_SITE_OTHER): Payer: BLUE CROSS/BLUE SHIELD | Admitting: Family Medicine

## 2019-08-31 ENCOUNTER — Other Ambulatory Visit: Payer: Self-pay | Admitting: Family Medicine

## 2019-08-31 VITALS — BP 120/84 | Ht 66.0 in | Wt 220.0 lb

## 2019-08-31 DIAGNOSIS — U071 COVID-19: Secondary | ICD-10-CM | POA: Diagnosis not present

## 2019-08-31 DIAGNOSIS — Z20828 Contact with and (suspected) exposure to other viral communicable diseases: Secondary | ICD-10-CM | POA: Diagnosis not present

## 2019-08-31 DIAGNOSIS — M79605 Pain in left leg: Secondary | ICD-10-CM

## 2019-08-31 NOTE — Patient Instructions (Signed)
You have greater trochanteric bursitis and piriformis syndrome. Avoid painful activities as much as possible. Ice over area of pain 3-4 times a day for 15 minutes at a time Hip side raise exercise 3 sets of 10 once a day - add weights if this becomes too easy. Stretches - pick 2-3 and hold for 20-30 seconds x 3 - do once or twice a day. Continue naproxen as you have been. Start physical therapy and do the home exercises on days you don't go to therapy. If not improving, can consider steroid injection. Follow up with me in 6 weeks for this otherwise.

## 2019-09-01 ENCOUNTER — Encounter: Payer: Self-pay | Admitting: Family Medicine

## 2019-09-01 DIAGNOSIS — R42 Dizziness and giddiness: Secondary | ICD-10-CM | POA: Diagnosis not present

## 2019-09-01 NOTE — Progress Notes (Signed)
PCP: [name], DO  Subjective:   HPI: Patient is a 54 y.o. female here for left hip pain.  Patient reports she's had left lateral hip pain for over a month. Some pain into left gluteal musculature as well. Worse lying on her left side - can only stand this for a few minutes and has to reposition. No pain in back or down her left leg. No right sided pain. No numbness, tingling. Has not tried any treatment for this yet.  Past Medical History:  Diagnosis Date  . Anemia    anemia  when pregnant in 2002  . Anxiety   . Arthritis    "knees and back" (09/28/2016)  . Asthma 5 yrs ago    told she had asthma.. does not remember doctors name.   . Chronic lower back pain   . Depression    takes citalopram  . History of hiatal hernia   . History of kidney stones   . Hyperlipidemia   . Hypothyroidism   . LBBB (left bundle branch block)    rate-related; identified on stress test 03/21/16  . Migraine    "weekly" (09/28/2016)  . 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 Shortness of breath    with exertion  . Thyroid disease   . Type II diabetes mellitus ([place]) dx'd 2016    Current Outpatient Medications on File Prior to Visit  Medication Sig Dispense Refill  . acetaminophen (TYLENOL) 500 MG tablet Take 1,000 mg by mouth every 6 (six) hours as needed for mild pain.    Marland Kitchen ALPRAZolam (XANAX) 0.5 MG tablet Take 1 tablet (0.5 mg total) by mouth 3 (three) times daily as needed for anxiety. 10 tablet 0  . aspirin EC 81 MG tablet Take 1 tablet (81 mg total) by mouth daily. 90 tablet 3  . azelastine (ASTELIN) 0.1 % nasal spray Place 1 spray into both nostrils 2 (two) times daily. Use in each nostril as directed 30 mL 12  . blood glucose meter kit and supplies KIT Dispense based on patient and insurance preference. Use up to four times daily as directed. (FOR ICD-9 250.00, 250.01). 1 each 0  . Cholecalciferol (VITAMIN D3) 2000 units TABS  Take 2,000 Units by mouth daily.     . citalopram (CELEXA) 20 MG tablet Take 1 tablet (20 mg total) by mouth daily. 90 tablet 3  . cyanocobalamin 500 MCG tablet Take 1,000 mcg by mouth daily.     . cyclobenzaprine (FLEXERIL) 10 MG tablet Take 1 tablet (10 mg total) by mouth 3 (three) times daily as needed for muscle spasms. 30 tablet 0  . Flaxseed, Linseed, (FLAXSEED OIL PO) Take 1,300 mg by mouth daily.    . fluticasone (FLONASE) 50 MCG/ACT nasal spray Place 2 sprays into both nostrils daily. 16 g 6  . Ginkgo Biloba Extract 60 MG CAPS Take 60 mg by mouth daily.    Marland Kitchen glimepiride (AMARYL) 2 MG tablet Take 1 tablet (2 mg total) by mouth daily before breakfast. 90 tablet 1  . glucose blood (ONETOUCH VERIO) test strip Onetouch Verio Check blood sugar twice daily 100 each 12  . levocetirizine (XYZAL) 5 MG tablet Take 1 tablet (5 mg total) by mouth every evening. 30 tablet 5  . losartan (COZAAR) 50 MG tablet Take 50 mg by mouth daily.    . meloxicam (MOBIC) 15 MG tablet Take 1 tablet (15 mg  total) by mouth daily. 30 tablet 0  . naproxen (NAPROSYN) 500 MG tablet Take 1 tablet (500 mg total) by mouth 2 (two) times daily as needed. 60 tablet 1  . Omega-3 Fatty Acids (FISH OIL) 1200 MG CAPS Take 1,200 mg by mouth daily.    Glory Rosebush Delica Lancets 12X MISC 1 Device by Does not apply route 2 (two) times daily as needed. 60 each 5  . pantoprazole (PROTONIX) 40 MG tablet Take 1 tablet (40 mg total) by mouth daily. 90 tablet 3  . rosuvastatin (CRESTOR) 20 MG tablet Take 1 tablet (20 mg total) by mouth daily. 90 tablet 1  . Semaglutide,0.25 or 0.5MG/DOS, (OZEMPIC, 0.25 OR 0.5 MG/DOSE,) 2 MG/1.5ML SOPN Inject 0.25 mg into the skin once a week. 1.5 mL 5   No current facility-administered medications on file prior to visit.    Past Surgical History:  Procedure Laterality Date  . DILATION AND CURETTAGE OF UTERUS  1999  . ENDOMETRIAL ABLATION    . EXTRACORPOREAL SHOCK WAVE LITHOTRIPSY Right 04/02/2019    Procedure: EXTRACORPOREAL SHOCK WAVE LITHOTRIPSY (ESWL);  Surgeon: Ceasar Mons, MD;  Location: [facility];  Service: Urology;  Laterality: Right;  . LAPAROSCOPY     years ago ..pt does not know where surgery was done ... states" my stomach would blow up then go down"  . THYROIDECTOMY  09/28/2011   Procedure: THYROIDECTOMY;  Surgeon: Izora Gala, MD;  Location: [place];  Service: ENT;  Laterality: Right;  RIGHT THYROIDECTOMY WITH FROZEN SECTION  . THYROIDECTOMY  09/28/2016   completion of thyroidectomy/notes 09/28/2016  . THYROIDECTOMY Left 09/28/2016   Procedure: COMPLETION OF THYROIDECTOMY;  Surgeon: Izora Gala, MD;  Location: [place];  Service: ENT;  Laterality: Left;  . TUBAL LIGATION      Allergies  Allergen Reactions  . Lisinopril Cough    Social History   Socioeconomic History  . Marital status: Married    Spouse name: Not on file  . Number of children: 3  . Years of education: Not on file  . Highest education level: Not on file  Occupational History  . Occupation: CNA  Tobacco Use  . Smoking status: Never Smoker  . Smokeless tobacco: Never Used  Substance and Sexual Activity  . Alcohol use: No    Alcohol/week: 0.0 standard drinks  . Drug use: No  . Sexual activity: Not on file  Other Topics Concern  . Not on file  Social History Narrative   No exercise--  Walking and lifting pts at work   Right handed   Lives in single story home   Social Determinants of Health   Financial Resource Strain:   . Difficulty of Paying Living Expenses: Not on file  Food Insecurity:   . Worried About Charity fundraiser in the Last Year: Not on file  . Ran Out of Food in the Last Year: Not on file  Transportation Needs:   . Lack of Transportation (Medical): Not on file  . Lack of Transportation (Non-Medical): Not on file  Physical Activity:   . Days of Exercise per Week: Not on file  . Minutes of Exercise per Session: Not on file  Stress:   . Feeling of Stress : Not on file   Social Connections:   . Frequency of Communication with Friends and Family: Not on file  . Frequency of Social Gatherings with Friends and Family: Not on file  . Attends Religious Services: Not on file  . Active Member of  Clubs or Organizations: Not on file  . Attends Archivist Meetings: Not on file  . Marital Status: Not on file  Intimate Partner Violence:   . Fear of Current or Ex-Partner: Not on file  . Emotionally Abused: Not on file  . Physically Abused: Not on file  . Sexually Abused: Not on file    Family History  Problem Relation Age of Onset  . Diabetes Mother   . Hypertension Mother   . Hyperlipidemia Mother   . Gallbladder disease Mother   . Heart disease Mother   . Diabetes Sister   . Hypertension Father   . Hyperlipidemia Father   . Colon polyps Father   . Ovarian cancer Maternal Grandmother   . Gallbladder disease Maternal Grandmother   . Breast cancer Maternal Grandmother 60  . Colon cancer Paternal Grandfather   . Breast cancer Maternal Aunt        late 23s  . Heart disease Other        early cad  . Diabetes Brother   . Heart attack Brother   . Anesthesia problems Neg Hx   . Hypotension Neg Hx   . Malignant hyperthermia Neg Hx   . Pseudochol deficiency Neg Hx   . Esophageal cancer Neg Hx     BP 120/84   Ht _0  (1.676 m)   Wt 220 lb (99.8 kg)   BMI 35.51 kg/m   Review of Systems: See HPI above.     Objective:  Physical Exam:  Gen: NAD, comfortable in exam room  Back: No gross deformity, scoliosis. No paraspinal TTP .  No midline or bony TTP. FROM without pain. Strength LEs 5/5 all muscle groups except left hip abduction.   Negative SLRs. Sensation intact to light touch bilaterally.  Left hip: No deformity. FROM with 5/5 strength except 5-/5 with hip abduction. TTP greater trochanter and over piriformis.  No other tenderness. NVI distally. Negative logroll. Negative fabers and piriformis stretches.   Assessment &  Plan:  1. Left hip pain - 2/2 greater trochanteric pain syndrome with piriformis syndrome.  Discussed options - she will start physical therapy, home exercises, naproxen.  Icing, consider steroid injection if not improving.  F/u in 6 weeks.

## 2019-09-05 ENCOUNTER — Other Ambulatory Visit: Payer: Self-pay | Admitting: Family Medicine

## 2019-09-05 DIAGNOSIS — E785 Hyperlipidemia, unspecified: Secondary | ICD-10-CM

## 2019-09-06 DIAGNOSIS — N200 Calculus of kidney: Secondary | ICD-10-CM | POA: Diagnosis not present

## 2019-09-07 ENCOUNTER — Other Ambulatory Visit: Payer: Self-pay | Admitting: Family Medicine

## 2019-09-07 DIAGNOSIS — N200 Calculus of kidney: Secondary | ICD-10-CM | POA: Diagnosis not present

## 2019-09-07 DIAGNOSIS — Z1231 Encounter for screening mammogram for malignant neoplasm of breast: Secondary | ICD-10-CM

## 2019-09-08 DIAGNOSIS — U071 COVID-19: Secondary | ICD-10-CM | POA: Diagnosis not present

## 2019-09-08 DIAGNOSIS — Z20828 Contact with and (suspected) exposure to other viral communicable diseases: Secondary | ICD-10-CM | POA: Diagnosis not present

## 2019-09-10 ENCOUNTER — Ambulatory Visit: Payer: BLUE CROSS/BLUE SHIELD | Admitting: Physical Therapy

## 2019-09-14 ENCOUNTER — Ambulatory Visit (INDEPENDENT_AMBULATORY_CARE_PROVIDER_SITE_OTHER): Payer: BLUE CROSS/BLUE SHIELD | Admitting: Family Medicine

## 2019-09-14 ENCOUNTER — Other Ambulatory Visit: Payer: Self-pay

## 2019-09-14 VITALS — BP 122/88 | Ht 66.0 in | Wt 220.0 lb

## 2019-09-14 DIAGNOSIS — S46812A Strain of other muscles, fascia and tendons at shoulder and upper arm level, left arm, initial encounter: Secondary | ICD-10-CM

## 2019-09-14 DIAGNOSIS — M654 Radial styloid tenosynovitis [de Quervain]: Secondary | ICD-10-CM

## 2019-09-14 MED ORDER — DICLOFENAC SODIUM 75 MG PO TBEC: 75.0000 mg | DELAYED_RELEASE_TABLET | Freq: Two times a day (BID) | ORAL | 1 refills | Status: DC

## 2019-09-14 NOTE — Patient Instructions (Signed)
Try diclofenac twice a day with food - stop your naproxen. Continue the brace. Avoid painful activities as much as possible. Ice 15 minutes at a time 3-4 times a day. A cortisone injection is an option. Physical therapy for iontophoresis is another consideration. Follow up with me in 6 weeks for reevaluation but call me sooner if you want to do the injection.  You also have trapezius spasms/strain, AC joint arthritis, biceps tendinitis. Start physical therapy and do home exercises they teach you on days you don't go to therapy. Heat 15 minutes at a time 3-4 times a day. Follow up with me in 6 weeks.

## 2019-09-14 NOTE — Progress Notes (Signed)
PCP: Felicia Solis  Subjective:   HPI: Patient is a 54 y.o. female here for f/u of left wrist De Quervain's Tenosynovitis and left shoulder pain.  2/22 - 2 weeks of left wrist pain.  States has gotten worse over the past week.  She denies any known trauma or falls.  She states that she has noticed some swelling along her wrist on the radial side.  States that the pain will sometimes radiate to the end of her thumb.  Treated with naproxen and thumb spica brace - 1 week of left shoulder pain located on the anterior and posterior portions of her shoulder. Pain is worse when she lifts her arm in abduction.  She reports that she has chronic neck pain but this feels different.  She denies any numbness or tingling down her arm.  Today: -today patient endorses little improvement of wrist pain. Pain continues along the lateral aspect of the left thumb and radiates proximally up the radial side of arm. She has utilized brace daily at work as well as at night without improvement. NSAIDS have been ineffective at pain relief.  -Shoulder pain has continued. Notes the pain located in the anterior portion over bicipital groove is worse than posterior shoulder pain. She has not attempted home exercises denies radiating pain down arm.   Past Medical History:  Diagnosis Date  . Anemia    anemia  when pregnant in 2002  . Anxiety   . Arthritis    "knees and back" (09/28/2016)  . Asthma 5 yrs ago    told she had asthma.. does not remember doctors name.   . Chronic lower back pain   . Depression    takes citalopram  . History of hiatal hernia   . History of kidney stones   . Hyperlipidemia   . Hypothyroidism   . LBBB (left bundle branch block)    rate-related; identified on stress test 03/21/16  . Migraine    "weekly" (09/28/2016)  . 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 Shortness of breath    with exertion  . Thyroid disease   . Type II  diabetes mellitus (Felicia Solis) dx'd 2016    Current Outpatient Medications on File Prior to Visit  Medication Sig Dispense Refill  . acetaminophen (TYLENOL) 500 MG tablet Take 1,000 mg by mouth every 6 (six) hours as needed for mild pain.    Marland Kitchen ALPRAZolam (XANAX) 0.5 MG tablet Take 1 tablet (0.5 mg total) by mouth 3 (three) times daily as needed for anxiety. 10 tablet 0  . aspirin EC 81 MG tablet Take 1 tablet (81 mg total) by mouth daily. 90 tablet 3  . azelastine (ASTELIN) 0.1 % nasal spray Place 1 spray into both nostrils 2 (two) times daily. Use in each nostril as directed 30 mL 12  . blood glucose meter kit and supplies KIT Dispense based on patient and insurance preference. Use up to four times daily as directed. (FOR ICD-9 250.00, 250.01). 1 each 0  . Cholecalciferol (VITAMIN D3) 2000 units TABS Take 2,000 Units by mouth daily.     . citalopram (CELEXA) 20 MG tablet Take 1 tablet (20 mg total) by mouth daily. 90 tablet 3  . cyanocobalamin 500 MCG tablet Take 1,000 mcg by mouth daily.     . cyclobenzaprine (FLEXERIL) 10 MG tablet Take 1 tablet (10 mg total) by mouth 3 (three) times daily as  needed for muscle spasms. 30 tablet 0  . EUTHYROX 88 MCG tablet Take 1 tablet by mouth once daily 90 tablet 0  . Flaxseed, Linseed, (FLAXSEED OIL PO) Take 1,300 mg by mouth daily.    . fluticasone (FLONASE) 50 MCG/ACT nasal spray Place 2 sprays into both nostrils daily. 16 g 6  . Ginkgo Biloba Extract 60 MG CAPS Take 60 mg by mouth daily.    Marland Kitchen glimepiride (AMARYL) 2 MG tablet TAKE 1 TABLET BY MOUTH ONCE DAILY BEFORE BREAKFAST 90 tablet 0  . glucose blood (ONETOUCH VERIO) test strip Onetouch Verio Check blood sugar twice daily 100 each 12  . levocetirizine (XYZAL) 5 MG tablet Take 1 tablet (5 mg total) by mouth every evening. 30 tablet 5  . losartan (COZAAR) 50 MG tablet Take 50 mg by mouth daily.    . Omega-3 Fatty Acids (FISH OIL) 1200 MG CAPS Take 1,200 mg by mouth daily.    Glory Rosebush Delica Lancets 33K  MISC 1 Device by Does not apply route 2 (two) times daily as needed. 60 each 5  . pantoprazole (PROTONIX) 40 MG tablet Take 1 tablet (40 mg total) by mouth daily. 90 tablet 3  . rosuvastatin (CRESTOR) 20 MG tablet Take 1 tablet by mouth once daily 90 tablet 0  . Semaglutide,0.25 or 0.5MG/DOS, (OZEMPIC, 0.25 OR 0.5 MG/DOSE,) 2 MG/1.5ML SOPN Inject 0.25 mg into the skin once a week. 1.5 mL 5   No current facility-administered medications on file prior to visit.    Past Surgical History:  Procedure Laterality Date  . DILATION AND CURETTAGE OF UTERUS  1999  . ENDOMETRIAL ABLATION    . EXTRACORPOREAL SHOCK WAVE LITHOTRIPSY Right 04/02/2019   Procedure: EXTRACORPOREAL SHOCK WAVE LITHOTRIPSY (ESWL);  Surgeon: Ceasar Mons, MD;  Location: [facility];  Service: Urology;  Laterality: Right;  . LAPAROSCOPY     years ago ..pt does not know where surgery was done ... states" my stomach would blow up then go down"  . THYROIDECTOMY  09/28/2011   Procedure: THYROIDECTOMY;  Surgeon: Izora Gala, MD;  Location: [place];  Service: ENT;  Laterality: Right;  RIGHT THYROIDECTOMY WITH FROZEN SECTION  . THYROIDECTOMY  09/28/2016   completion of thyroidectomy/notes 09/28/2016  . THYROIDECTOMY Left 09/28/2016   Procedure: COMPLETION OF THYROIDECTOMY;  Surgeon: Izora Gala, MD;  Location: [place];  Service: ENT;  Laterality: Left;  . TUBAL LIGATION      Allergies  Allergen Reactions  . Lisinopril Cough    Social History   Socioeconomic History  . Marital status: Married    Spouse name: Not on file  . Number of children: 3  . Years of education: Not on file  . Highest education level: Not on file  Occupational History  . Occupation: CNA  Tobacco Use  . Smoking status: Never Smoker  . Smokeless tobacco: Never Used  Substance and Sexual Activity  . Alcohol use: No    Alcohol/week: 0.0 standard drinks  . Drug use: No  . Sexual activity: Not on file  Other Topics Concern  . Not on file  Social  History Narrative   No exercise--  Walking and lifting pts at work   Right handed   Lives in single story home   Social Determinants of Health   Financial Resource Strain:   . Difficulty of Paying Living Expenses:   Food Insecurity:   . Worried About Charity fundraiser in the Last Year:   . [employer]  in the Last Year:   Transportation Needs:   . Film/video editor (Medical):   Marland Kitchen Lack of Transportation (Non-Medical):   Physical Activity:   . Days of Exercise per Week:   . Minutes of Exercise per Session:   Stress:   . Feeling of Stress :   Social Connections:   . Frequency of Communication with Friends and Family:   . Frequency of Social Gatherings with Friends and Family:   . Attends Religious Services:   . Active Member of Clubs or Organizations:   . Attends Archivist Meetings:   Marland Kitchen Marital Status:   Intimate Partner Violence:   . Fear of Current or Ex-Partner:   . Emotionally Abused:   Marland Kitchen Physically Abused:   . Sexually Abused:     Family History  Problem Relation Age of Onset  . Diabetes Mother   . Hypertension Mother   . Hyperlipidemia Mother   . Gallbladder disease Mother   . Heart disease Mother   . Diabetes Sister   . Hypertension Father   . Hyperlipidemia Father   . Colon polyps Father   . Ovarian cancer Maternal Grandmother   . Gallbladder disease Maternal Grandmother   . Breast cancer Maternal Grandmother 17  . Colon cancer Paternal Grandfather   . Breast cancer Maternal Aunt        late 54s  . Heart disease Other        early cad  . Diabetes Brother   . Heart attack Brother   . Anesthesia problems Neg Hx   . Hypotension Neg Hx   . Malignant hyperthermia Neg Hx   . Pseudochol deficiency Neg Hx   . Esophageal cancer Neg Hx     BP 122/88   Ht 5' 6" (1.676 m)   Wt 220 lb (99.8 kg)   BMI 35.51 kg/m   Review of Systems: See HPI above.     Objective:  Physical Exam:  General: Alert and oriented x3, NAD, comfortable in  exam room HEENT: normocephalic and atraumatic, EOMI, conjunctivae and sclerae clear, MMM, no erythema or exudates, trachea midline,  Left Wrist Inspection: mild swelling over radial side of hand, no scarring, no overlying bruising or breaks in the skin, no obvious bony abnormalities Palpation: tenderness 1st dorsal compartment.  No tenderness 1st CMC, carpal tunnel  ROM (passive): complete ROM in all direction however patient endorses pain with thumb motions ROM (active): complete ROM in all direction, pain with both ulnar and radial deviation Special Tests: positive Finkelstein's   Left Shoulder Inspection: no swelling, no scarring, no overlying bruising or breaks in the skin, no obvious bony abnormalities Palpation: TTP over bicipital groove and AC joint, increased trapezius spasm compared to contralateral side ROM (passive): complete ROM in all directions ROM (active): complete ROM in all directions Strength: 5/5 in all directions without pain IR, ER, empty can Special Tests: negative Hawkin's, negative Neer's, negative Yergason's, positive Cross-arm test, negative empty can sign, negative liftoff  Assessment & Plan:   Felicia Solis is a 54 y.o. female presenting for f/u of left shoulder and left wrist pain. Exam reassuring for no rotator cuff pathology with preserved strength in all directions. Pain in anterior and superior aspects more consistent with biceps tendinitis and AC joint arthritis. Trapezius spasm consistent with trapezius strain. Patient's de quervain's has not improved with conservative bracing and naproxen and continued pain on exam with positive Finkelstein's.    1. Left De Quervain's Tenosynovitis -- patient deferral of  cortisone injection, if not improving patient can return for injection -- stop naproxen -- diclofenac BID with food -- continue bracing -- f/u in 6 weeks   2. Left Shoulder Pain -- start PT for trapezius strain/AC arthritis/biceps tendinitis --  f/u in 6 weeks   [place]

## 2019-09-15 DIAGNOSIS — U071 COVID-19: Secondary | ICD-10-CM | POA: Diagnosis not present

## 2019-09-15 DIAGNOSIS — Z20828 Contact with and (suspected) exposure to other viral communicable diseases: Secondary | ICD-10-CM | POA: Diagnosis not present

## 2019-09-16 ENCOUNTER — Encounter: Payer: Self-pay | Admitting: Family Medicine

## 2019-09-17 ENCOUNTER — Ambulatory Visit: Payer: BLUE CROSS/BLUE SHIELD | Attending: Family Medicine | Admitting: Physical Therapy

## 2019-09-17 ENCOUNTER — Encounter: Payer: Self-pay | Admitting: Physical Therapy

## 2019-09-17 ENCOUNTER — Other Ambulatory Visit: Payer: Self-pay

## 2019-09-17 DIAGNOSIS — R29898 Other symptoms and signs involving the musculoskeletal system: Secondary | ICD-10-CM | POA: Insufficient documentation

## 2019-09-17 DIAGNOSIS — M62838 Other muscle spasm: Secondary | ICD-10-CM

## 2019-09-17 DIAGNOSIS — R293 Abnormal posture: Secondary | ICD-10-CM | POA: Diagnosis not present

## 2019-09-17 DIAGNOSIS — M6281 Muscle weakness (generalized): Secondary | ICD-10-CM

## 2019-09-17 DIAGNOSIS — M25512 Pain in left shoulder: Secondary | ICD-10-CM | POA: Diagnosis not present

## 2019-09-17 DIAGNOSIS — M25552 Pain in left hip: Secondary | ICD-10-CM | POA: Diagnosis not present

## 2019-09-17 NOTE — Therapy (Signed)
[facility] [address]  [place], [place], [address] Phone: [phone]   Fax:  [phone]  Physical Therapy Evaluation  Patient Details  Name: Felicia Solis MRN: [id number] Date of Birth: [birth year] Referring Provider (PT): Karlton Lemon, MD   Encounter Date: 09/17/2019  PT End of Session - 09/17/19 0853    Visit Number  1    Number of Visits  16    Date for PT Re-Evaluation  11/12/19    Authorization Type  [employer]    PT Start Time  [id number]   Pt arrived late   PT Stop Time  0935    PT Time Calculation (min)  42 min    Activity Tolerance  Patient tolerated treatment well    Behavior During Therapy  [facility] for tasks assessed/performed       Past Medical History:  Diagnosis Date  . Anemia    anemia  when pregnant in 2002  . Anxiety   . Arthritis    "knees and back" (09/28/2016)  . Asthma 5 yrs ago    told she had asthma.. does not remember doctors name.   . Chronic lower back pain   . Depression    takes citalopram  . History of hiatal hernia   . History of kidney stones   . Hyperlipidemia   . Hypothyroidism   . LBBB (left bundle branch block)    rate-related; identified on stress test 03/21/16  . Migraine    "weekly" (09/28/2016)  . 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 Shortness of breath    with exertion  . Thyroid disease   . Type II diabetes mellitus ([place]) dx'd 2016    Past Surgical History:  Procedure Laterality Date  . DILATION AND CURETTAGE OF UTERUS  1999  . ENDOMETRIAL ABLATION    . EXTRACORPOREAL SHOCK WAVE LITHOTRIPSY Right 04/02/2019   Procedure: EXTRACORPOREAL SHOCK WAVE LITHOTRIPSY (ESWL);  Surgeon: Ceasar Mons, MD;  Location: [facility];  Service: Urology;  Laterality: Right;  . LAPAROSCOPY     years ago ..pt does not know where surgery was done ... states" my stomach would blow up then go down"  . THYROIDECTOMY  09/28/2011    Procedure: THYROIDECTOMY;  Surgeon: Izora Gala, MD;  Location: [facility];  Service: ENT;  Laterality: Right;  RIGHT THYROIDECTOMY WITH FROZEN SECTION  . THYROIDECTOMY  09/28/2016   completion of thyroidectomy/notes 09/28/2016  . THYROIDECTOMY Left 09/28/2016   Procedure: COMPLETION OF THYROIDECTOMY;  Surgeon: Izora Gala, MD;  Location: [facility];  Service: ENT;  Laterality: Left;  . TUBAL LIGATION      There were no vitals filed for this visit.   Subjective Assessment - 09/17/19 0858    Subjective  Pt reports gradual onset of L upper shoulder pain for >1 month w/o known MOI. Pain limits use of L arm with most activites. Pt also noting pain in L lateral hip and buttock which orginated around the same time.Creates difficulty finding a comfortable position to sleep.    Limitations  Sitting;Walking;Lifting;House hold activities    How long can you sit comfortably?  45-60 minutes    How long can you walk comfortably?  1 hr    Diagnostic tests  n/a    Patient Stated Goals  "to get this pain away"    Currently in Pain?  Yes    Pain Score  4     Pain Location  Shoulder    Pain Orientation  Left;Upper    Pain Descriptors / Indicators  Constant;Aching;Throbbing    Pain Type  Acute pain    Pain Radiating Towards  into L side of neck    Pain Onset  More than a month ago    Pain Frequency  Constant    Aggravating Factors   raising L arm overhead, heavy lifting    Pain Relieving Factors  Tylenol, diclofenac    Effect of Pain on Daily Activities  limits use of L arm    Pain Score  3    Pain Location  Hip    Pain Orientation  Left    Pain Descriptors / Indicators  Aching;Throbbing    Pain Type  Acute pain    Pain Onset  More than a month ago    Pain Frequency  Intermittent    Aggravating Factors   laying on L side    Pain Relieving Factors  Tylenol, diclofenac    Effect of Pain on Daily Activities  interferes with sleeping         [facility] PT Assessment - 09/17/19 0853      Assessment   Medical  Diagnosis  L bicep tendinopathy, trapezius spasm, and AC joint arthritis; L piriformis syndrome & greater trochanteric pain syndrome    Referring Provider (PT)  Karlton Lemon, MD    Onset Date/Surgical Date  --   > 1 month   Hand Dominance  Right    Next MD Visit  10/26/19    Prior Therapy  prior PT for back and knee a few years ago      Precautions   Precautions  None      Balance Screen   Has the patient fallen in the past 6 months  No    Has the patient had a decrease in activity level because of a fear of falling?   No    Is the patient reluctant to leave their home because of a fear of falling?   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    Leisure  puzzles, time with family, watching game shows, no regular exercise      Cognition   Overall Cognitive Status  Within Functional Limits for tasks assessed      Observation/Other Assessments   Focus on Therapeutic Outcomes (FOTO)   Not assesses d/t mutiple active problems      Sensation   Light Touch  Appears Intact      Posture/Postural Control   Posture/Postural Control  Postural limitations    Postural Limitations  Forward head;Rounded Shoulders      ROM / Strength   AROM / PROM / Strength  AROM;Strength      AROM   Overall AROM Comments  B shoulder ROM WNL but painful on L; B hip ROM WFL    AROM Assessment Site  Cervical;Shoulder;Hip    Cervical Flexion  50    Cervical Extension  38    Cervical - Right Side Bend  20   pain   Cervical - Left Side Bend  29    Cervical - Right Rotation  60   pain & pulling   Cervical - Left Rotation  60   pulling     Strength   Strength Assessment Site  Shoulder;Hip;Knee    Right/Left  Shoulder  Right;Left    Right Shoulder Flexion  4+/5    Right Shoulder ABduction  5/5    Right Shoulder Internal Rotation  5/5    Right Shoulder External Rotation  5/5    Left Shoulder Flexion  4/5    Left Shoulder ABduction  4+/5    Left Shoulder Internal Rotation  5/5    Left Shoulder External Rotation  4+/5    Right/Left Hip  Right;Left    Right Hip Flexion  4+/5    Right Hip Extension  4/5    Right Hip External Rotation   4-/5    Right Hip Internal Rotation  4+/5    Right Hip ABduction  4/5    Right Hip ADduction  4/5    Left Hip Flexion  4+/5    Left Hip Extension  4-/5    Left Hip External Rotation  4-/5    Left Hip Internal Rotation  4/5    Left Hip ABduction  4-/5    Left Hip ADduction  4/5    Right/Left Knee  Right;Left    Right Knee Flexion  5/5    Right Knee Extension  5/5    Left Knee Flexion  5/5    Left Knee Extension  5/5      Flexibility   Soft Tissue Assessment /Muscle Length  yes    Hamstrings  mild tight B    Quadriceps  mild tight B    ITB  mild tight B    Piriformis  mild tight L > R      Palpation   Palpation comment  ttp t/o L buttock, esp glute min/med, and lateral hip over greater trochanter; ttp in L UT, pecs & proximal biceps                Objective measurements completed on examination: See above findings.      [facility] PT Treatment/Exercise - 09/17/19 0853      Self-Care   Self-Care  Posture    Posture  Provided cues for chin tuck and scap retraction to promote neutral head and shoulde alignment             PT Education - 09/17/19 0935    Education Details  PT eval findings and anticipated POC    Person(s) Educated  Patient    Methods  Explanation    Comprehension  Verbalized understanding       PT Short Term Goals - 09/17/19 0935      PT SHORT TERM GOAL #1   Title  Patient will be independent with initial HEP    Status  New    Target Date  10/08/19      PT SHORT TERM GOAL #2   Title  Patient to report  pain reduction in frequency and intensity by >/= 25%    Status  New    Target Date  10/15/19      PT SHORT TERM GOAL #3   Title  Patient to demonstrate improved tissue quality and pliability with reduced pain    Status  New    Target Date  10/15/19  PT Long Term Goals - 09/17/19 0935      PT LONG TERM GOAL #1   Title  Patient will be independent with ongoing/advanced HEP    Status  New    Target Date  11/12/19      PT LONG TERM GOAL #2   Title  Patient to demonstrate appropriate posture and body mechanics needed for daily activities    Status  New    Target Date  11/12/19      PT LONG TERM GOAL #3   Title  Patient to improve cervical  and L shoulder AROM to WNL without pain provocation    Status  New    Target Date  11/12/19      PT LONG TERM GOAL #4   Title  Patient will demonstrate improved L proximal UE and LE strength to >/= 4+/5 for improved function    Status  New    Target Date  11/12/19      PT LONG TERM GOAL #5   Title  Patient to report ability to perform work and daily activities without pain provocation    Status  New    Target Date  11/12/19      PT LONG TERM GOAL #6   Title  Patient will report no sleep disturbance due to pain    Status  New    Target Date  11/12/19             Plan - 09/17/19 0935    Clinical Impression Statement  Lyndora is a 54 y/o female who presents to OP PT with 2 referrals for L piriformis syndrome/greater trochanteric pain syndrome and L bicep tendinopathy, trapezius spasm, and AC joint arthritis. She reports onset of pain for both issues ~1 month ago without known MOI. She notes some relief of pain from switch from Naproxen to Voltaren but pain returns as meds wear off. Current deficits include ongoing pain in L upper shoulder/neck and L hip/buttock, limited B proximal LE flexibility, increased muscle tension with ttp throughout glutes and piriformis and over greater trochanter at the L hip as well as L UT, pecs and  proximal biceps, limited cervical ROM, as well as proximal B LE weakness (L>R) and L shoulder weakness. L hip/buttock pain limits walking tolerance and ability to find comfortable sleeping position, while L shoulder/neck pain limits functional use of L UE and ability to comfortably move neck. Paelyn will benefit from skilled PT to address above deficits to restore normal flexibility and functional ROM without pain, improve strength for better stability and functional use of L UE, and to reduce or eliminate pain with daily activities.    Personal Factors and Comorbidities  Comorbidity 3+;Time since onset of injury/illness/exacerbation;Past/Current Experience;Fitness;Profession    Comorbidities  De Quervain's tenosynovitis (L); cervical HNP; lumbar DDD; B knee pain; DM - type II; peripheral vascular disorder; HTN; migraines; hypothyroidism; vertigo    Examination-Activity Limitations  Caring for Others;Carry;Lift;Locomotion Level;Sit;Sleep;Squat;Stand;Transfers;Reach Overhead    Examination-Participation Restrictions  Cleaning;Community Activity;Interpersonal Relationship;Laundry;Meal Prep;Shop;Volunteer;Yard Work    [employer]  Evolving/Moderate complexity    Clinical Decision Making  Moderate    Rehab Potential  Good    PT Frequency  2x / week    PT Duration  8 weeks    PT Treatment/Interventions  ADLs/Self Care Home Management;Cryotherapy;Electrical Stimulation;Iontophoresis 4mg /ml Dexamethasone;Moist Heat;Ultrasound;Gait training;Stair training;Functional mobility training;Therapeutic activities;Therapeutic exercise;Balance training;Neuromuscular re-education;Patient/family education;Manual techniques;Passive range of motion;Dry needling;Taping;Vasopneumatic Device;Joint Manipulations    PT Next Visit Plan  Initial HEP  Consulted and Agree with Plan of Care  Patient       Patient will benefit from skilled therapeutic intervention in order to improve the following deficits  and impairments:  Abnormal gait, Decreased activity tolerance, Decreased balance, Decreased endurance, Decreased knowledge of precautions, Decreased mobility, Decreased range of motion, Decreased safety awareness, Decreased strength, Difficulty walking, Increased edema, Increased fascial restricitons, Increased muscle spasms, Impaired perceived functional ability, Impaired flexibility, Impaired UE functional use, Improper body mechanics, Postural dysfunction, Pain  Visit Diagnosis: Acute pain of left shoulder  Pain in left hip  Muscle weakness (generalized)  Other muscle spasm  Other symptoms and signs involving the musculoskeletal system  Abnormal posture     Problem List Patient Active Problem List   Diagnosis Date Noted  . Trapezius muscle strain, left, initial encounter 08/17/2019  . Left wrist pain 08/11/2019  . Acute pain of left shoulder 08/11/2019  . Acute nonintractable headache 08/11/2019  . De Quervain's tenosynovitis, left 08/11/2019  . Vertigo 06/09/2019  . Right acute serous otitis media 06/09/2019  . Impacted cerumen of left ear 06/09/2019  . Pelvic pain 02/23/2019  . HNP (herniated nucleus pulposus), cervical 01/04/2019  . Epigastric mass 10/21/2018  . Myalgia 03/18/2018  . Hyperlipidemia associated with type 2 diabetes mellitus ([place]) 03/18/2018  . Nausea and vomiting 12/02/2017  . Hematuria 12/02/2017  . Pansinusitis 11/19/2017  . 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 Preventative health care 09/27/2017  . Hyperlipidemia LDL goal <100 09/18/2017  . Vitamin D deficiency 09/18/2017  . Hypothyroidism 09/18/2017  . Other intervertebral disc degeneration, lumbar region 04/07/2017  . Left hip pain 04/07/2017  . Hyperlipidemia LDL goal <70 04/07/2017  . Essential hypertension 04/07/2017  . Acute vaginitis 04/07/2017  . S/P complete thyroidectomy 09/28/2016  . Memory loss 08/23/2016  . Thyroid nodule 08/02/2016  . Plantar fasciitis of right foot  12/01/2015  . Knee pain, right 12/01/2015  . Headache, migraine 09/06/2015  . Bilateral knee pain 08/22/2015  . Thoracic myofascial strain 08/22/2015  . Dysphagia 04/10/2015  . Atypical chest pain 04/10/2015  . Epigastric pain 04/10/2015  . Functional diarrhea 04/10/2015  . NSAID long-term use 04/10/2015  . Midepigastric pain 03/22/2015  . Cough 01/07/2015  . Allergic rhinitis 12/13/2014  . Acute bronchitis 12/13/2014  . Blood in stool 10/01/2014  . DM (diabetes mellitus) type II uncontrolled, periph vascular disorder ([place]) 08/13/2014  . Abnormal ECG 05/14/2012  . Breast pain 10/23/2011  . ASCUS on Pap smear 10/08/2011  . H/O thyroid nodule 08/27/2011  . Depression with anxiety 08/27/2011  . Heavy periods 08/27/2011    Percival Spanish, PT, MPT 09/17/2019, 6:36 PM  [facility] [address]  [place], [place], [address] Phone: [phone]   Fax:  [phone]  Name: TYJAE ZOTTOLA MRN: [id number] Date of Birth: [birth year]

## 2019-09-18 ENCOUNTER — Ambulatory Visit: Payer: BLUE CROSS/BLUE SHIELD | Admitting: Orthopaedic Surgery

## 2019-09-21 ENCOUNTER — Ambulatory Visit: Payer: BLUE CROSS/BLUE SHIELD | Admitting: Family Medicine

## 2019-09-28 ENCOUNTER — Ambulatory Visit: Payer: BLUE CROSS/BLUE SHIELD | Admitting: Family Medicine

## 2019-09-29 ENCOUNTER — Ambulatory Visit: Payer: BC Managed Care – PPO | Attending: Family Medicine

## 2019-09-29 DIAGNOSIS — R29898 Other symptoms and signs involving the musculoskeletal system: Secondary | ICD-10-CM | POA: Insufficient documentation

## 2019-09-29 DIAGNOSIS — M25512 Pain in left shoulder: Secondary | ICD-10-CM | POA: Insufficient documentation

## 2019-09-29 DIAGNOSIS — R293 Abnormal posture: Secondary | ICD-10-CM | POA: Insufficient documentation

## 2019-09-29 DIAGNOSIS — M62838 Other muscle spasm: Secondary | ICD-10-CM | POA: Insufficient documentation

## 2019-09-29 DIAGNOSIS — M6281 Muscle weakness (generalized): Secondary | ICD-10-CM | POA: Insufficient documentation

## 2019-09-29 DIAGNOSIS — M25552 Pain in left hip: Secondary | ICD-10-CM | POA: Insufficient documentation

## 2019-10-01 ENCOUNTER — Encounter: Payer: Self-pay | Admitting: Physical Therapy

## 2019-10-01 ENCOUNTER — Ambulatory Visit: Payer: BC Managed Care – PPO | Admitting: Physical Therapy

## 2019-10-01 ENCOUNTER — Ambulatory Visit
Admission: RE | Admit: 2019-10-01 | Discharge: 2019-10-01 | Disposition: A | Discharge status: Home / Self Care | Payer: BLUE CROSS/BLUE SHIELD | Source: Ambulatory Visit | Attending: Family Medicine | Admitting: Family Medicine

## 2019-10-01 ENCOUNTER — Other Ambulatory Visit: Payer: Self-pay

## 2019-10-01 DIAGNOSIS — R293 Abnormal posture: Secondary | ICD-10-CM

## 2019-10-01 DIAGNOSIS — M25512 Pain in left shoulder: Secondary | ICD-10-CM | POA: Diagnosis not present

## 2019-10-01 DIAGNOSIS — M25552 Pain in left hip: Secondary | ICD-10-CM

## 2019-10-01 DIAGNOSIS — M6281 Muscle weakness (generalized): Secondary | ICD-10-CM

## 2019-10-01 DIAGNOSIS — R29898 Other symptoms and signs involving the musculoskeletal system: Secondary | ICD-10-CM | POA: Diagnosis not present

## 2019-10-01 DIAGNOSIS — M62838 Other muscle spasm: Secondary | ICD-10-CM

## 2019-10-01 DIAGNOSIS — Z1231 Encounter for screening mammogram for malignant neoplasm of breast: Secondary | ICD-10-CM | POA: Diagnosis not present

## 2019-10-01 NOTE — Therapy (Signed)
[facility] [address]  [place], [place], [address] Phone: [phone]   Fax:  [phone]  Physical Therapy Treatment  Patient Details  Name: Felicia Solis MRN: [id number] Date of Birth: [birth year] Referring Provider (PT): Karlton Lemon, MD   Encounter Date: 10/01/2019  PT End of Session - 10/01/19 1532    Visit Number  2    Number of Visits  16    Date for PT Re-Evaluation  11/12/19    Authorization Type  BCBS    PT Start Time  1532    PT Stop Time  1618    PT Time Calculation (min)  46 min    Activity Tolerance  Patient tolerated treatment well    Behavior During Therapy  [facility] for tasks assessed/performed       Past Medical History:  Diagnosis Date  . Anemia    anemia  when pregnant in 2002  . Anxiety   . Arthritis    "knees and back" (09/28/2016)  . Asthma 5 yrs ago    told she had asthma.. does not remember doctors name.   . Chronic lower back pain   . Depression    takes citalopram  . History of hiatal hernia   . History of kidney stones   . Hyperlipidemia   . Hypothyroidism   . LBBB (left bundle branch block)    rate-related; identified on stress test 03/21/16  . Migraine    "weekly" (09/28/2016)  . 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 Shortness of breath    with exertion  . Thyroid disease   . Type II diabetes mellitus ([place]) dx'd 2016    Past Surgical History:  Procedure Laterality Date  . DILATION AND CURETTAGE OF UTERUS  1999  . ENDOMETRIAL ABLATION    . EXTRACORPOREAL SHOCK WAVE LITHOTRIPSY Right 04/02/2019   Procedure: EXTRACORPOREAL SHOCK WAVE LITHOTRIPSY (ESWL);  Surgeon: Ceasar Mons, MD;  Location: [facility];  Service: Urology;  Laterality: Right;  . LAPAROSCOPY     years ago ..pt does not know where surgery was done ... states" my stomach would blow up then go down"  . THYROIDECTOMY  09/28/2011   Procedure:  THYROIDECTOMY;  Surgeon: Izora Gala, MD;  Location: [place];  Service: ENT;  Laterality: Right;  RIGHT THYROIDECTOMY WITH FROZEN SECTION  . THYROIDECTOMY  09/28/2016   completion of thyroidectomy/notes 09/28/2016  . THYROIDECTOMY Left 09/28/2016   Procedure: COMPLETION OF THYROIDECTOMY;  Surgeon: Izora Gala, MD;  Location: [place];  Service: ENT;  Laterality: Left;  . TUBAL LIGATION      There were no vitals filed for this visit.  Subjective Assessment - 10/01/19 1537    Subjective  Pt reporting she is "not doing too bad today."    Patient Stated Goals  "to get this pain away"    Currently in Pain?  Yes    Pain Score  2     Pain Location  Shoulder    Pain Orientation  Left;Upper    Pain Descriptors / Indicators  Dull;Aching    Pain Type  Acute pain    Pain Frequency  Constant    Pain Score  4    Pain Location  Hip    Pain Orientation  Left    Pain Descriptors / Indicators  Dull;Aching;Stabbing    Pain Type  Acute pain    Pain  Frequency  Intermittent                       [facility] Adult PT Treatment/Exercise - 10/01/19 1532      Neck Exercises: Standing   Neck Retraction  10 reps;5 secs      Knee/Hip Exercises: Stretches   Passive Hamstring Stretch  Left;30 seconds;2 reps    Passive Hamstring Stretch Limitations  seated hip hinge    Hip Flexor Stretch  Left;30 seconds;2 reps    Hip Flexor Stretch Limitations  seated lunge position over edge chair    ITB Stretch  Left;30 seconds;2 reps    ITB Stretch Limitations  standing lateral lean with hip pointing away from wall    Piriformis Stretch  Left;30 seconds;2 reps    Piriformis Stretch Limitations  seated figure 4 with LE supported on mat table + slight hip hinge      Knee/Hip Exercises: Aerobic   Recumbent Bike  L1 x 6 min      Knee/Hip Exercises: Standing   Hip Abduction  Left;Right;Knee straight;Stengthening    Abduction Limitations  glute medius 45 dg kick-back      Knee/Hip Exercises: Sidelying   Clams  L  clam 10 x 3"      Shoulder Exercises: Standing   Row  Both;10 reps;Strengthening;Theraband    Theraband Level (Shoulder Row)  Level 1 (Yellow)   loop for L hand/wrist d/t splint   Row Limitations  emphasis on scap retraction with elbows tucking into ribs      Neck Exercises: Stretches   Upper Trapezius Stretch  Left;30 seconds;2 reps    Upper Trapezius Stretch Limitations  hand under/behind hip    Levator Stretch  Left;30 seconds;2 reps    Levator Stretch Limitations  hand behind hip    Corner Stretch  30 seconds;3 reps    Corner Stretch Limitations  3-way doorway stretch             PT Education - 10/01/19 1615    Education Details  Initial HEP    Person(s) Educated  Patient    Methods  Explanation;Demonstration;Handout    Comprehension  Verbalized understanding;Returned demonstration       PT Short Term Goals - 10/01/19 1540      PT SHORT TERM GOAL #1   Title  Patient will be independent with initial HEP    Status  On-going    Target Date  10/08/19      PT SHORT TERM GOAL #2   Title  Patient to report pain reduction in frequency and intensity by >/= 25%    Status  On-going    Target Date  10/15/19      PT SHORT TERM GOAL #3   Title  Patient to demonstrate improved tissue quality and pliability with reduced pain    Status  On-going    Target Date  10/15/19        PT Long Term Goals - 10/01/19 1540      PT LONG TERM GOAL #1   Title  Patient will be independent with ongoing/advanced HEP    Status  On-going    Target Date  11/12/19      PT LONG TERM GOAL #2   Title  Patient to demonstrate appropriate posture and body mechanics needed for daily activities    Status  On-going    Target Date  11/12/19      PT LONG TERM GOAL #3   Title  Patient  to improve cervical  and L shoulder AROM to WNL without pain provocation    Status  On-going    Target Date  11/12/19      PT LONG TERM GOAL #4   Title  Patient will demonstrate improved L proximal UE and LE  strength to >/= 4+/5 for improved function    Status  On-going    Target Date  11/12/19      PT LONG TERM GOAL #5   Title  Patient to report ability to perform work and daily activities without pain provocation    Status  On-going    Target Date  11/12/19      PT LONG TERM GOAL #6   Title  Patient will report no sleep disturbance due to pain    Status  On-going    Target Date  11/12/19            Plan - 10/01/19 1541    Clinical Impression Statement  Instruction provided in initial HEP for L bicep tendinopathy, trapezius spasm, and AC joint arthritis as well as L piriformis syndrome and greater trochanteric pain syndrome. Initial exercises focusing on stretches to reduce muscle tension/tightness and basic strengthening to promote normal muscle activation. Stretches and exercises modified/adjusted to minimize strain on L wrist due to splint for De Quervain's tenosynovitis. Felicia Solis able to perform good return demonstration of all stretches and exercises.    Personal Factors and Comorbidities  Comorbidity 3+;Time since onset of injury/illness/exacerbation;Past/Current Experience;Fitness;Profession    Comorbidities  De Quervain's tenosynovitis (L); cervical HNP; lumbar DDD; B knee pain; DM - type II; peripheral vascular disorder; HTN; migraines; hypothyroidism; vertigo    Examination-Activity Limitations  Caring for Others;Carry;Lift;Locomotion Level;Sit;Sleep;Squat;Stand;Transfers;Reach Overhead    Examination-Participation Restrictions  Cleaning;Community Activity;Interpersonal Relationship;Laundry;Meal Prep;Shop;Volunteer;Yard Work    Rehab Potential  Good    PT Frequency  2x / week    PT Duration  8 weeks    PT Treatment/Interventions  ADLs/Self Care Home Management;Cryotherapy;Electrical Stimulation;Iontophoresis 4mg /ml Dexamethasone;Moist Heat;Ultrasound;Gait training;Stair training;Functional mobility training;Therapeutic activities;Therapeutic exercise;Balance training;Neuromuscular  re-education;Patient/family education;Manual techniques;Passive range of motion;Dry needling;Taping;Vasopneumatic Device;Joint Manipulations    PT Next Visit Plan  review initial HEP as needed; L shoulder/neck stretches; postural strengthening; L hip stretches and strengthening; manual therapy and modalities as indicated    PT Home Exercise Plan  10/01/19 - UT, LS and doorway pes/biceps stretches, cervical retraction, yellow TB scap retraction/rows, HS, ITB, piriformis and hip flexor stretches, glute med kick-backs, clams    Consulted and Agree with Plan of Care  Patient       Patient will benefit from skilled therapeutic intervention in order to improve the following deficits and impairments:  Abnormal gait, Decreased activity tolerance, Decreased balance, Decreased endurance, Decreased knowledge of precautions, Decreased mobility, Decreased range of motion, Decreased safety awareness, Decreased strength, Difficulty walking, Increased edema, Increased fascial restricitons, Increased muscle spasms, Impaired perceived functional ability, Impaired flexibility, Impaired UE functional use, Improper body mechanics, Postural dysfunction, Pain  Visit Diagnosis: Acute pain of left shoulder  Pain in left hip  Muscle weakness (generalized)  Other muscle spasm  Other symptoms and signs involving the musculoskeletal system  Abnormal posture     Problem List Patient Active Problem List   Diagnosis Date Noted  . Trapezius muscle strain, left, initial encounter 08/17/2019  . Left wrist pain 08/11/2019  . Acute pain of left shoulder 08/11/2019  . Acute nonintractable headache 08/11/2019  . De Quervain's tenosynovitis, left 08/11/2019  . Vertigo 06/09/2019  . Right acute serous otitis  media 06/09/2019  . Impacted cerumen of left ear 06/09/2019  . Pelvic pain 02/23/2019  . HNP (herniated nucleus pulposus), cervical 01/04/2019  . Epigastric mass 10/21/2018  . Myalgia 03/18/2018  . Hyperlipidemia  associated with type 2 diabetes mellitus ([place]) 03/18/2018  . Nausea and vomiting 12/02/2017  . Hematuria 12/02/2017  . Pansinusitis 11/19/2017  . 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 Preventative health care 09/27/2017  . Hyperlipidemia LDL goal <100 09/18/2017  . Vitamin D deficiency 09/18/2017  . Hypothyroidism 09/18/2017  . Other intervertebral disc degeneration, lumbar region 04/07/2017  . Left hip pain 04/07/2017  . Hyperlipidemia LDL goal <70 04/07/2017  . Essential hypertension 04/07/2017  . Acute vaginitis 04/07/2017  . S/P complete thyroidectomy 09/28/2016  . Memory loss 08/23/2016  . Thyroid nodule 08/02/2016  . Plantar fasciitis of right foot 12/01/2015  . Knee pain, right 12/01/2015  . Headache, migraine 09/06/2015  . Bilateral knee pain 08/22/2015  . Thoracic myofascial strain 08/22/2015  . Dysphagia 04/10/2015  . Atypical chest pain 04/10/2015  . Epigastric pain 04/10/2015  . Functional diarrhea 04/10/2015  . NSAID long-term use 04/10/2015  . Midepigastric pain 03/22/2015  . Cough 01/07/2015  . Allergic rhinitis 12/13/2014  . Acute bronchitis 12/13/2014  . Blood in stool 10/01/2014  . DM (diabetes mellitus) type II uncontrolled, periph vascular disorder ([place]) 08/13/2014  . Abnormal ECG 05/14/2012  . Breast pain 10/23/2011  . ASCUS on Pap smear 10/08/2011  . H/O thyroid nodule 08/27/2011  . Depression with anxiety 08/27/2011  . Heavy periods 08/27/2011    Percival Spanish, PT, MPT 10/01/2019, 6:31 PM  [facility] [address]  [place], [place], [address] Phone: [phone]   Fax:  [phone]  Name: Felicia Solis MRN: [id number] Date of Birth: Jun 27, 1965

## 2019-10-05 ENCOUNTER — Other Ambulatory Visit: Payer: Self-pay | Admitting: Family Medicine

## 2019-10-05 DIAGNOSIS — R928 Other abnormal and inconclusive findings on diagnostic imaging of breast: Secondary | ICD-10-CM

## 2019-10-06 ENCOUNTER — Ambulatory Visit: Payer: BC Managed Care – PPO

## 2019-10-06 ENCOUNTER — Other Ambulatory Visit: Payer: Self-pay

## 2019-10-06 DIAGNOSIS — M6281 Muscle weakness (generalized): Secondary | ICD-10-CM

## 2019-10-06 DIAGNOSIS — R293 Abnormal posture: Secondary | ICD-10-CM | POA: Diagnosis not present

## 2019-10-06 DIAGNOSIS — M25552 Pain in left hip: Secondary | ICD-10-CM | POA: Diagnosis not present

## 2019-10-06 DIAGNOSIS — R29898 Other symptoms and signs involving the musculoskeletal system: Secondary | ICD-10-CM

## 2019-10-06 DIAGNOSIS — M62838 Other muscle spasm: Secondary | ICD-10-CM | POA: Diagnosis not present

## 2019-10-06 DIAGNOSIS — M25512 Pain in left shoulder: Secondary | ICD-10-CM

## 2019-10-06 NOTE — Therapy (Signed)
[facility] [address]  [place], [place], [address] Phone: [phone]   Fax:  [phone]  Physical Therapy Treatment  Patient Details  Name: Felicia Solis MRN: [id number] Date of Birth: 1965/12/28 Referring Provider (PT): Karlton Lemon, MD   Encounter Date: 10/06/2019  PT End of Session - 10/06/19 1544    Visit Number  3    Number of Visits  16    Date for PT Re-Evaluation  11/12/19    Authorization Type  BCBS    PT Start Time  1537    PT Stop Time  1615    PT Time Calculation (min)  38 min    Activity Tolerance  Patient tolerated treatment well    Behavior During Therapy  [facility] for tasks assessed/performed       Past Medical History:  Diagnosis Date  . Anemia    anemia  when pregnant in 2002  . Anxiety   . Arthritis    "knees and back" (09/28/2016)  . Asthma 5 yrs ago    told she had asthma.. does not remember doctors name.   . Chronic lower back pain   . Depression    takes citalopram  . History of hiatal hernia   . History of kidney stones   . Hyperlipidemia   . Hypothyroidism   . LBBB (left bundle branch block)    rate-related; identified on stress test 03/21/16  . Migraine    "weekly" (09/28/2016)  . 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 Shortness of breath    with exertion  . Thyroid disease   . Type II diabetes mellitus ([place]) dx'd 2016    Past Surgical History:  Procedure Laterality Date  . DILATION AND CURETTAGE OF UTERUS  1999  . ENDOMETRIAL ABLATION    . EXTRACORPOREAL SHOCK WAVE LITHOTRIPSY Right 04/02/2019   Procedure: EXTRACORPOREAL SHOCK WAVE LITHOTRIPSY (ESWL);  Surgeon: Ceasar Mons, MD;  Location: [facility];  Service: Urology;  Laterality: Right;  . LAPAROSCOPY     years ago ..pt does not know where surgery was done ... states" my stomach would blow up then go down"  . THYROIDECTOMY  09/28/2011   Procedure:  THYROIDECTOMY;  Surgeon: Izora Gala, MD;  Location: [facility];  Service: ENT;  Laterality: Right;  RIGHT THYROIDECTOMY WITH FROZEN SECTION  . THYROIDECTOMY  09/28/2016   completion of thyroidectomy/notes 09/28/2016  . THYROIDECTOMY Left 09/28/2016   Procedure: COMPLETION OF THYROIDECTOMY;  Surgeon: Izora Gala, MD;  Location: [facility];  Service: ENT;  Laterality: Left;  . TUBAL LIGATION      There were no vitals filed for this visit.  Subjective Assessment - 10/06/19 1542    Subjective  Pt. denying pain today.    Patient Stated Goals  "to get this pain away"    Currently in Pain?  No/denies    Pain Score  0-No pain   up to 7/10 at worst   Pain Location  Shoulder    Pain Orientation  Left;Upper    Pain Descriptors / Indicators  Dull;Aching    Pain Type  Acute pain    Multiple Pain Sites  Yes    Pain Score  0   up to 8/10 hip pain last night without known trigger   Pain Location  Hip    Pain Orientation  Left    Pain Descriptors / Indicators  Dull;Aching;Stabbing    Pain Type  Acute pain    Pain Frequency  Intermittent                       [facility] Adult PT Treatment/Exercise - 10/06/19 0001      Knee/Hip Exercises: Stretches   Hip Flexor Stretch  Left;30 seconds;2 reps    Hip Flexor Stretch Limitations  mod thomas + strap    Piriformis Stretch  Left;30 seconds;2 reps    Piriformis Stretch Limitations  KTOS      Knee/Hip Exercises: Aerobic   Nustep  lvl 3, 6 min (UE/LE)      Knee/Hip Exercises: Sidelying   Clams  L clam 10 x 3"      Neck Exercises: Stretches   Upper Trapezius Stretch  Left;Right;1 rep;30 seconds    Upper Trapezius Stretch Limitations  hand under/behind hip    Levator Stretch  Left;Right;1 rep;30 seconds    Levator Stretch Limitations  hand behind hip    Corner Stretch  30 seconds;3 reps   reviewed as pt. noting high version some L UT pain at home    Corner Stretch Limitations  3-way doorway stretch   pain free in session with all three versions                PT Short Term Goals - 10/06/19 1601      PT SHORT TERM GOAL #1   Title  Patient will be independent with initial HEP    Status  Achieved    Target Date  10/08/19      PT SHORT TERM GOAL #2   Title  Patient to report pain reduction in frequency and intensity by >/= 25%    Status  On-going    Target Date  10/15/19      PT SHORT TERM GOAL #3   Title  Patient to demonstrate improved tissue quality and pliability with reduced pain    Status  On-going    Target Date  10/15/19        PT Long Term Goals - 10/01/19 1540      PT LONG TERM GOAL #1   Title  Patient will be independent with ongoing/advanced HEP    Status  On-going    Target Date  11/12/19      PT LONG TERM GOAL #2   Title  Patient to demonstrate appropriate posture and body mechanics needed for daily activities    Status  On-going    Target Date  11/12/19      PT LONG TERM GOAL #3   Title  Patient to improve cervical  and L shoulder AROM to WNL without pain provocation    Status  On-going    Target Date  11/12/19      PT LONG TERM GOAL #4   Title  Patient will demonstrate improved L proximal UE and LE strength to >/= 4+/5 for improved function    Status  On-going    Target Date  11/12/19      PT LONG TERM GOAL #5   Title  Patient to report ability to perform work and daily activities without pain provocation    Status  On-going    Target Date  11/12/19      PT LONG TERM GOAL #6   Title  Patient will report no sleep disturbance due to pain    Status  On-going    Target Date  11/12/19  Plan - 10/06/19 1605    Clinical Impression Statement  Pt. able to verbalize/demonstrate good understnading of HEP after review today.  STG #1 met.  Session focused on anterior chest stretching, scapular strengthening, and cervical/upper shoulder flexibility activities with pt. denying increased pain.  Ended visit without pain thus modalties deferred.    Comorbidities  De Quervain's  tenosynovitis (L); cervical HNP; lumbar DDD; B knee pain; DM - type II; peripheral vascular disorder; HTN; migraines; hypothyroidism; vertigo    Rehab Potential  Good    PT Treatment/Interventions  ADLs/Self Care Home Management;Cryotherapy;Electrical Stimulation;Iontophoresis '4mg'$ /ml Dexamethasone;Moist Heat;Ultrasound;Gait training;Stair training;Functional mobility training;Therapeutic activities;Therapeutic exercise;Balance training;Neuromuscular re-education;Patient/family education;Manual techniques;Passive range of motion;Dry needling;Taping;Vasopneumatic Device;Joint Manipulations    PT Next Visit Plan  L shoulder/neck stretches; postural strengthening; L hip stretches and strengthening; manual therapy and modalities as indicated    PT Home Exercise Plan  10/01/19 - UT, LS and doorway pes/biceps stretches, cervical retraction, yellow TB scap retraction/rows, HS, ITB, piriformis and hip flexor stretches, glute med kick-backs, clams    Consulted and Agree with Plan of Care  Patient       Patient will benefit from skilled therapeutic intervention in order to improve the following deficits and impairments:  Abnormal gait, Decreased activity tolerance, Decreased balance, Decreased endurance, Decreased knowledge of precautions, Decreased mobility, Decreased range of motion, Decreased safety awareness, Decreased strength, Difficulty walking, Increased edema, Increased fascial restricitons, Increased muscle spasms, Impaired perceived functional ability, Impaired flexibility, Impaired UE functional use, Improper body mechanics, Postural dysfunction, Pain  Visit Diagnosis: Acute pain of left shoulder  Pain in left hip  Muscle weakness (generalized)  Other muscle spasm  Other symptoms and signs involving the musculoskeletal system  Abnormal posture     Problem List Patient Active Problem List   Diagnosis Date Noted  . Trapezius muscle strain, left, initial encounter 08/17/2019  . Left wrist  pain 08/11/2019  . Acute pain of left shoulder 08/11/2019  . Acute nonintractable headache 08/11/2019  . De Quervain's tenosynovitis, left 08/11/2019  . Vertigo 06/09/2019  . Right acute serous otitis media 06/09/2019  . Impacted cerumen of left ear 06/09/2019  . Pelvic pain 02/23/2019  . HNP (herniated nucleus pulposus), cervical 01/04/2019  . Epigastric mass 10/21/2018  . Myalgia 03/18/2018  . Hyperlipidemia associated with type 2 diabetes mellitus ([place]) 03/18/2018  . Nausea and vomiting 12/02/2017  . Hematuria 12/02/2017  . Pansinusitis 11/19/2017  . 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 Preventative health care 09/27/2017  . Hyperlipidemia LDL goal <100 09/18/2017  . Vitamin D deficiency 09/18/2017  . Hypothyroidism 09/18/2017  . Other intervertebral disc degeneration, lumbar region 04/07/2017  . Left hip pain 04/07/2017  . Hyperlipidemia LDL goal <70 04/07/2017  . Essential hypertension 04/07/2017  . Acute vaginitis 04/07/2017  . S/P complete thyroidectomy 09/28/2016  . Memory loss 08/23/2016  . Thyroid nodule 08/02/2016  . Plantar fasciitis of right foot 12/01/2015  . Knee pain, right 12/01/2015  . Headache, migraine 09/06/2015  . Bilateral knee pain 08/22/2015  . Thoracic myofascial strain 08/22/2015  . Dysphagia 04/10/2015  . Atypical chest pain 04/10/2015  . Epigastric pain 04/10/2015  . Functional diarrhea 04/10/2015  . NSAID long-term use 04/10/2015  . Midepigastric pain 03/22/2015  . Cough 01/07/2015  . Allergic rhinitis 12/13/2014  . Acute bronchitis 12/13/2014  . Blood in stool 10/01/2014  . DM (diabetes mellitus) type II uncontrolled, periph vascular disorder ([place]) 08/13/2014  . Abnormal ECG 05/14/2012  . Breast pain 10/23/2011  . ASCUS on Pap  smear 10/08/2011  . H/O thyroid nodule 08/27/2011  . Depression with anxiety 08/27/2011  . Heavy periods 08/27/2011    Bess Harvest, PTA 10/06/19 6:11 PM   [facility] [address]  [place], [place], [address] Phone: [phone]   Fax:  [phone]  Name: Felicia Solis MRN: [id number] Date of Birth: [birth year]

## 2019-10-08 ENCOUNTER — Other Ambulatory Visit: Payer: Self-pay

## 2019-10-08 ENCOUNTER — Ambulatory Visit: Payer: BC Managed Care – PPO

## 2019-10-08 DIAGNOSIS — R293 Abnormal posture: Secondary | ICD-10-CM | POA: Diagnosis not present

## 2019-10-08 DIAGNOSIS — M62838 Other muscle spasm: Secondary | ICD-10-CM | POA: Diagnosis not present

## 2019-10-08 DIAGNOSIS — R29898 Other symptoms and signs involving the musculoskeletal system: Secondary | ICD-10-CM | POA: Diagnosis not present

## 2019-10-08 DIAGNOSIS — M25552 Pain in left hip: Secondary | ICD-10-CM | POA: Diagnosis not present

## 2019-10-08 DIAGNOSIS — M25512 Pain in left shoulder: Secondary | ICD-10-CM | POA: Diagnosis not present

## 2019-10-08 DIAGNOSIS — M6281 Muscle weakness (generalized): Secondary | ICD-10-CM | POA: Diagnosis not present

## 2019-10-08 NOTE — Therapy (Addendum)
[facility] [address]  [place], [place], [address] Phone: [phone]   Fax:  [phone]  Physical Therapy Treatment / Discharge Summary  Patient Details  Name: Felicia Solis MRN: [id number] Date of Birth: September 29, 1965 Referring Provider (PT): Karlton Lemon, MD   Encounter Date: 10/08/2019  PT End of Session - 10/08/19 1545    Visit Number  4    Number of Visits  16    Date for PT Re-Evaluation  11/12/19    Authorization Type  BCBS    PT Start Time  1537    PT Stop Time  1619    PT Time Calculation (min)  42 min    Activity Tolerance  Patient tolerated treatment well    Behavior During Therapy  [facility] for tasks assessed/performed       Past Medical History:  Diagnosis Date  . Anemia    anemia  when pregnant in 2002  . Anxiety   . Arthritis    "knees and back" (09/28/2016)  . Asthma 5 yrs ago    told she had asthma.. does not remember doctors name.   . Chronic lower back pain   . Depression    takes citalopram  . History of hiatal hernia   . History of kidney stones   . Hyperlipidemia   . Hypothyroidism   . LBBB (left bundle branch block)    rate-related; identified on stress test 03/21/16  . Migraine    "weekly" (09/28/2016)  . 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 Shortness of breath    with exertion  . Thyroid disease   . Type II diabetes mellitus ([place]) dx'd 2016    Past Surgical History:  Procedure Laterality Date  . DILATION AND CURETTAGE OF UTERUS  1999  . ENDOMETRIAL ABLATION    . EXTRACORPOREAL SHOCK WAVE LITHOTRIPSY Right 04/02/2019   Procedure: EXTRACORPOREAL SHOCK WAVE LITHOTRIPSY (ESWL);  Surgeon: Ceasar Mons, MD;  Location: [facility];  Service: Urology;  Laterality: Right;  . LAPAROSCOPY     years ago ..pt does not know where surgery was done ... states" my stomach would blow up then go down"  . THYROIDECTOMY  09/28/2011    Procedure: THYROIDECTOMY;  Surgeon: Izora Gala, MD;  Location: [facility];  Service: ENT;  Laterality: Right;  RIGHT THYROIDECTOMY WITH FROZEN SECTION  . THYROIDECTOMY  09/28/2016   completion of thyroidectomy/notes 09/28/2016  . THYROIDECTOMY Left 09/28/2016   Procedure: COMPLETION OF THYROIDECTOMY;  Surgeon: Izora Gala, MD;  Location: [facility];  Service: ENT;  Laterality: Left;  . TUBAL LIGATION      There were no vitals filed for this visit.  Subjective Assessment - 10/08/19 1543    Subjective  Pt. reporting mild soreness in L hip after last visit.    Diagnostic tests  n/a    Patient Stated Goals  "to get this pain away"    Currently in Pain?  No/denies    Pain Score  3     Pain Location  Shoulder    Pain Orientation  Left;Upper    Pain Descriptors / Indicators  Dull;Aching    Pain Type  Acute pain    Pain Radiating Towards  Radiating to "under armpit"    Pain Onset  More than a month ago    Pain Frequency  Intermittent    Multiple Pain Sites  No  Pain Score  0   up to 4/10 when laying on L side   Pain Location  Hip    Pain Orientation  Left    Pain Descriptors / Indicators  Dull;Aching    Pain Type  Acute pain    Pain Onset  More than a month ago    Pain Frequency  Intermittent    Aggravating Factors   laying on L side                       [facility] Adult PT Treatment/Exercise - 10/08/19 0001      Knee/Hip Exercises: Stretches   Hip Flexor Stretch  Left;30 seconds;1 rep   L UE pulling strap    Hip Flexor Stretch Limitations  mod thomas + strap    Piriformis Stretch  Left;30 seconds;2 reps    Piriformis Stretch Limitations  KTOS    Other Knee/Hip Stretches  hooklying LTR 5" x 10 reps     Other Knee/Hip Stretches  L Figure-4 stretch x 30 sec       Knee/Hip Exercises: Aerobic   Other Aerobic  UBE: lvl 1.0, 3 min forwards      Knee/Hip Exercises: Supine   Bridges  Both;10 reps    Bridges Limitations  cues for hip extension     Bridges with Clamshell  Both;10  reps;Strengthening   + isometric hip abd/ER into yellow TB    Other Supine Knee/Hip Exercises  Hooklying alternating yellow TB clam shell x 10 reps       Knee/Hip Exercises: Sidelying   Clams  L clam yellow TB 10 x 3"       Neck Exercises: Stretches   Upper Trapezius Stretch  Left;1 rep;30 seconds    Upper Trapezius Stretch Limitations  hand under/behind hip    Levator Stretch  Left;1 rep;30 seconds    Levator Stretch Limitations  hand behind hip    Corner Stretch  30 seconds;3 reps    Corner Stretch Limitations  3-way doorway stretch               PT Short Term Goals - 10/08/19 1607      PT SHORT TERM GOAL #1   Title  Patient will be independent with initial HEP    Status  Achieved    Target Date  10/08/19      PT SHORT TERM GOAL #2   Title  Patient to report pain reduction in frequency and intensity by >/= 25%    Status  Achieved   10/08/19   Target Date  10/15/19      PT SHORT TERM GOAL #3   Title  Patient to demonstrate improved tissue quality and pliability with reduced pain    Status  On-going    Target Date  10/15/19        PT Long Term Goals - 10/01/19 1540      PT LONG TERM GOAL #1   Title  Patient will be independent with ongoing/advanced HEP    Status  On-going    Target Date  11/12/19      PT LONG TERM GOAL #2   Title  Patient to demonstrate appropriate posture and body mechanics needed for daily activities    Status  On-going    Target Date  11/12/19      PT LONG TERM GOAL #3   Title  Patient to improve cervical  and L shoulder AROM to WNL without pain provocation  Status  On-going    Target Date  11/12/19      PT LONG TERM GOAL #4   Title  Patient will demonstrate improved L proximal UE and LE strength to >/= 4+/5 for improved function    Status  On-going    Target Date  11/12/19      PT LONG TERM GOAL #5   Title  Patient to report ability to perform work and daily activities without pain provocation    Status  On-going    Target  Date  11/12/19      PT LONG TERM GOAL #6   Title  Patient will report no sleep disturbance due to pain    Status  On-going    Target Date  11/12/19            Plan - 10/08/19 1548    Clinical Impression Statement  Pt. reporting 25% improvement in overall pain frequency and intensity since starting therapy.  STG #2 met.  Notes she has seen the most improvement in L hip pain - "it hasn't bothered me as much recently".  Progressed proximal hip/glute strengthening today without increased pain.  Duration of session focused on gentle cervical/shoulder stretching for improved comfort.  Pt. reports she is performing full HEP on days not in therapy.  Ended session with pt. denying pain thus modalities deferred.    Comorbidities  De Quervain's tenosynovitis (L); cervical HNP; lumbar DDD; B knee pain; DM - type II; peripheral vascular disorder; HTN; migraines; hypothyroidism; vertigo    Rehab Potential  Good    PT Treatment/Interventions  ADLs/Self Care Home Management;Cryotherapy;Electrical Stimulation;Iontophoresis 47m/ml Dexamethasone;Moist Heat;Ultrasound;Gait training;Stair training;Functional mobility training;Therapeutic activities;Therapeutic exercise;Balance training;Neuromuscular re-education;Patient/family education;Manual techniques;Passive range of motion;Dry needling;Taping;Vasopneumatic Device;Joint Manipulations    PT Next Visit Plan  L shoulder/neck stretches; postural strengthening; L hip stretches and strengthening; manual therapy and modalities as indicated    PT Home Exercise Plan  10/01/19 - UT, LS and doorway pes/biceps stretches, cervical retraction, yellow TB scap retraction/rows, HS, ITB, piriformis and hip flexor stretches, glute med kick-backs, clams    Consulted and Agree with Plan of Care  Patient       Patient will benefit from skilled therapeutic intervention in order to improve the following deficits and impairments:  Abnormal gait, Decreased activity tolerance, Decreased  balance, Decreased endurance, Decreased knowledge of precautions, Decreased mobility, Decreased range of motion, Decreased safety awareness, Decreased strength, Difficulty walking, Increased edema, Increased fascial restricitons, Increased muscle spasms, Impaired perceived functional ability, Impaired flexibility, Impaired UE functional use, Improper body mechanics, Postural dysfunction, Pain  Visit Diagnosis: Acute pain of left shoulder  Pain in left hip  Muscle weakness (generalized)  Other muscle spasm  Other symptoms and signs involving the musculoskeletal system  Abnormal posture     Problem List Patient Active Problem List   Diagnosis Date Noted  . Trapezius muscle strain, left, initial encounter 08/17/2019  . Left wrist pain 08/11/2019  . Acute pain of left shoulder 08/11/2019  . Acute nonintractable headache 08/11/2019  . De Quervain's tenosynovitis, left 08/11/2019  . Vertigo 06/09/2019  . Right acute serous otitis media 06/09/2019  . Impacted cerumen of left ear 06/09/2019  . Pelvic pain 02/23/2019  . HNP (herniated nucleus pulposus), cervical 01/04/2019  . Epigastric mass 10/21/2018  . Myalgia 03/18/2018  . Hyperlipidemia [place] 2 diabetes [place] 03/18/2018  . Nausea and vomiting 12/02/2017  . Hematuria 12/02/2017  . Pansinusitis 11/19/2017  . 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  Preventative health care 09/27/2017  . Hyperlipidemia LDL goal <100 09/18/2017  . Vitamin D deficiency 09/18/2017  . Hypothyroidism 09/18/2017  . Other intervertebral disc degeneration, lumbar region 04/07/2017  . Left hip pain 04/07/2017  . Hyperlipidemia LDL goal <70 04/07/2017  . Essential hypertension 04/07/2017  . Acute vaginitis 04/07/2017  . S/P complete thyroidectomy 09/28/2016  . Memory loss 08/23/2016  . Thyroid nodule 08/02/2016  . Plantar fasciitis of right foot 12/01/2015  . Knee pain, right 12/01/2015  . Headache, migraine 09/06/2015   . Bilateral knee pain 08/22/2015  . Thoracic myofascial strain 08/22/2015  . Dysphagia 04/10/2015  . Atypical chest pain 04/10/2015  . Epigastric pain 04/10/2015  . Functional diarrhea 04/10/2015  . NSAID long-term use 04/10/2015  . Midepigastric pain 03/22/2015  . Cough 01/07/2015  . Allergic rhinitis 12/13/2014  . Acute bronchitis 12/13/2014  . Blood in stool 10/01/2014  . DM (diabetes mellitus) type II uncontrolled, periph vascular disorder ([place]) 08/13/2014  . Abnormal ECG 05/14/2012  . Breast pain 10/23/2011  . ASCUS on Pap smear 10/08/2011  . H/O thyroid nodule 08/27/2011  . Depression with anxiety 08/27/2011  . Heavy periods 08/27/2011    Bess Harvest, PTA 10/08/19 4:41 PM   [facility] [address]  [place], [place], [address] Phone: [phone]   Fax:  [phone]  Name: GILLIAN MEEUWSEN MRN: [id number] Date of Birth: [birth year]   PHYSICAL THERAPY DISCHARGE SUMMARY  Visits from Start of Care: 4  Current functional level related to goals / functional outcomes:   Refer to above clinical impression for status as of last visit on 10/08/2019. As of 10/19/2019, patient called to cancel all remaining appointments due to inability to afford PT. 30 days have passed since last PT visit, therefore will proceed with discharge from PT for this episode.   Remaining deficits:   As above.    Education / Equipment:   HEP  Plan: Patient agrees to discharge.  Patient goals were partially met. Patient is being discharged due to financial reasons.  ?????     Percival Spanish, PT, MPT 11/06/19, 8:26 AM  [facility] [address]  [place], [place], [address] Phone: [phone]   Fax:  [phone]

## 2019-10-09 ENCOUNTER — Ambulatory Visit
Admission: RE | Admit: 2019-10-09 | Discharge: 2019-10-09 | Disposition: A | Discharge status: Home / Self Care | Payer: BC Managed Care – PPO | Source: Ambulatory Visit | Attending: Family Medicine | Admitting: Family Medicine

## 2019-10-09 ENCOUNTER — Other Ambulatory Visit: Payer: Self-pay | Admitting: Family Medicine

## 2019-10-09 DIAGNOSIS — R928 Other abnormal and inconclusive findings on diagnostic imaging of breast: Secondary | ICD-10-CM

## 2019-10-09 DIAGNOSIS — R921 Mammographic calcification found on diagnostic imaging of breast: Secondary | ICD-10-CM | POA: Diagnosis not present

## 2019-10-12 ENCOUNTER — Ambulatory Visit: Payer: BLUE CROSS/BLUE SHIELD | Admitting: Family Medicine

## 2019-10-13 ENCOUNTER — Ambulatory Visit: Payer: BC Managed Care – PPO

## 2019-10-14 ENCOUNTER — Ambulatory Visit: Payer: BC Managed Care – PPO

## 2019-10-20 ENCOUNTER — Ambulatory Visit: Payer: BC Managed Care – PPO

## 2019-10-22 ENCOUNTER — Encounter: Payer: BLUE CROSS/BLUE SHIELD | Admitting: Physical Therapy

## 2019-10-26 ENCOUNTER — Other Ambulatory Visit: Payer: Self-pay

## 2019-10-26 ENCOUNTER — Ambulatory Visit (INDEPENDENT_AMBULATORY_CARE_PROVIDER_SITE_OTHER): Payer: BC Managed Care – PPO | Admitting: Family Medicine

## 2019-10-26 ENCOUNTER — Encounter: Payer: Self-pay | Admitting: Family Medicine

## 2019-10-26 VITALS — BP 118/82 | Ht 66.0 in | Wt 222.0 lb

## 2019-10-26 DIAGNOSIS — M654 Radial styloid tenosynovitis [de Quervain]: Secondary | ICD-10-CM

## 2019-10-26 MED ORDER — METHYLPREDNISOLONE ACETATE 40 MG/ML IJ SUSP
20.0000 mg | Freq: Once | INTRAMUSCULAR | Status: AC
  Administered 2019-10-26: 20 mg via INTRA_ARTICULAR

## 2019-10-26 NOTE — Progress Notes (Signed)
    SUBJECTIVE:   CHIEF COMPLAINT / HPI:   Left de Quervain's tenosynovitis Patient presents for follow-up for left de Quervain's tenosynovitis.  This is for 6-week follow-up. She has been on diclofenac p.o. and wearing a spica brace.  Patient reports minimal improvement.   PERTINENT  PMH / PSH: Noncontributory  OBJECTIVE:   BP 118/82   Ht 5\' 6"  (1.676 m)   Wt 222 lb (100.7 kg)   BMI 35.83 kg/m   General: Well-appearing, no acute distress Psych: Nervous affect Left thumb Inspection: No bruising or effusion Palpation: Base of thumb on the dorsal aspect is tender to palpation.  Tenderness 1st dorsal compartment. ROM: Normal range of motion Strength: Normal strength Stability: Joint stable Special tests: Positive Finkelstein's Vascular studies:NVI   ASSESSMENT/PLAN:   De Quervain's tenosynovitis, left Patient follows up for left de Quervain's tenosynovitis.  Minimal improvement with NSAIDs and bracing.  -Steroid injection given today. -Follow-up in 1 month as needed   After informed written consent timeout was performed and patient was seated on exam table.  Area overlying left 1st dorsal compartment prepped with alcohol swab x 2.  Then utilizing ultrasound guidance, patient's left 1st dorsal compartment was injected with 0.5:0.86mL bupivicaine: depomedrol.  Patient tolerated procedure well without immediate complications.   Bonnita Hollow, MD [place]

## 2019-10-26 NOTE — Assessment & Plan Note (Signed)
Patient follows up for left de Quervain's tenosynovitis.  Minimal improvement with NSAIDs and bracing.  -Steroid injection given today. -Follow-up in 1 month as needed

## 2019-10-26 NOTE — Patient Instructions (Signed)
Follow up with me in a month if this comes back and we need to repeat the shot. Otherwise follow up with me as needed.

## 2019-10-29 ENCOUNTER — Encounter: Payer: BLUE CROSS/BLUE SHIELD | Admitting: Physical Therapy

## 2019-11-03 NOTE — Progress Notes (Addendum)
Virtual Visit via Video Note The purpose of this virtual visit is to provide medical care while limiting exposure to the novel coronavirus.    Consent was obtained for video visit:  Yes.   Answered questions that patient had about telehealth interaction:  Yes.   I discussed the limitations, risks, security and privacy concerns of performing an evaluation and management service by telemedicine. I also discussed with the patient that there may be a patient responsible charge related to this service. The patient expressed understanding and agreed to proceed.  Pt location: Home Physician Location: office Name of referring provider:  [name], * I connected with Felicia Solis at patients initiation/request on 11/04/2019 at  8:30 AM EDT by video enabled telemedicine application and verified that I am speaking with the correct person using two identifiers. Pt MRN:  [id number] Pt DOB:  May 04, 1966 Video Participants:  Felicia Solis   History of Present Illness:  Felicia Solis is a 54 year old female who follows up for dizziness and headache.  UPDATE: She has continued to have brief episodes of vertigo lasting 1 to 2 minutes at a time.  She returned to Dr. Constance Holster and Dix-Hallpike was negative.  She hasn't had a dizzy spell in over a month.  She started having more headaches, lasting an hour and occurring 2 to 3 days a week, sometimes more frequently.  She treats with Tylenol.  She also reports jaw pain and popping sensation when she would open her mouth, mostly right side.  Current NSAIDs:  ASA '81mg'$  daily Current analgesics:  Tylenol; Excedrin Migraine (hasn't been able to find in the store, which works better than Tylenol). Current muscle relaxant:  Flexeril '10mg'$  Current antihypertensive:  Losartan Current antidepressant:  Citalopram '20mg'$  Current antihistamine/decongestants:  Astelin; Flonase  HISTORY: In December 2020, she began experiencing dizziness described as spinning  sensation.  It first occurred upon getting up after she was bent over in the kitchen to clean the floor.  However, it occurs spontaneously as well.  It would last a couple of minutes.  No visual disturbance, nausea, vomiting, photophobia, phonophobia, weakness, slurred speech or headache.  She also felt that her left ear was clogged up and would pop.  She initially went to her PCP who found cerumen impaction which was removed and she was prescribed seasonal allergies and amoxicillin for possible otitis media.  However, she continued to have these spells.  She was evaluated by ENT, Dr. Constance Holster.  Her exam was unremarkable and she reported no recent episodes.  She was advised to follow up if symptoms return.  However, the vertigo returned.  She also started developing aural fullness in her right ear as well (like she was on an airplane) associated with muffled hearing.  No tinnitus.  On one occasion, the episode was followed by a 7/10 right pressure-like retro-orbital headache lasting for about an hour.  Associated with photophobia and phonophobia but not nausea, vomiting or visual disturbance.  On occasion, she has a pressure on the top of her head.  She reports remote history of migraines.  Over the past year, she developed a right cervical radiculopathy with pain and numbness.  MRI of cervical spine from 12/24/2018 was personally reviewed and showed C6-7 disc extrusion with right-sided spinal stenosis and neural foraminal stenosis affecting the C7 nerve root.    Past NSAIDs:  Diclofenac; meloxicam; naproxen Past analgesic:  Tylenol; tramadol Past anti-emetic:  Zofran '8mg'$  Past muscle relaxant:  methocarbamol Past  antihypertensive:  HCTZ; lisinopril Past antidepressant:  Sertraline  Past antiepileptic:  Gabapentin Past antihistamine/decongestant:  Claritin  Past Medical History: Past Medical History:  Diagnosis Date  . Anemia    anemia  when pregnant in 2002  . Anxiety   . Arthritis    "knees and back"  (09/28/2016)  . Asthma 5 yrs ago    told she had asthma.. does not remember doctors name.   . Chronic lower back pain   . Depression    takes citalopram  . History of hiatal hernia   . History of kidney stones   . Hyperlipidemia   . Hypothyroidism   . LBBB (left bundle branch block)    rate-related; identified on stress test 03/21/16  . Migraine    "weekly" (09/28/2016)  . 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 Shortness of breath    with exertion  . Thyroid disease   . Type II diabetes mellitus ([place]) dx'd 2016    Medications: Outpatient Encounter Medications as of 11/04/2019  Medication Sig  . acetaminophen (TYLENOL) 500 MG tablet Take 1,000 mg by mouth every 6 (six) hours as needed for mild pain.  Marland Kitchen ALPRAZolam (XANAX) 0.5 MG tablet Take 1 tablet (0.5 mg total) by mouth 3 (three) times daily as needed for anxiety.  Marland Kitchen aspirin EC 81 MG tablet Take 1 tablet (81 mg total) by mouth daily.  Marland Kitchen azelastine (ASTELIN) 0.1 % nasal spray Place 1 spray into both nostrils 2 (two) times daily. Use in each nostril as directed  . blood glucose meter kit and supplies KIT Dispense based on patient and insurance preference. Use up to four times daily as directed. (FOR ICD-9 250.00, 250.01).  . Cholecalciferol (VITAMIN D3) 2000 units TABS Take 2,000 Units by mouth daily.   . citalopram (CELEXA) 20 MG tablet Take 1 tablet (20 mg total) by mouth daily.  . cyanocobalamin 500 MCG tablet Take 1,000 mcg by mouth daily.   . cyclobenzaprine (FLEXERIL) 10 MG tablet Take 1 tablet (10 mg total) by mouth 3 (three) times daily as needed for muscle spasms.  . diclofenac (VOLTAREN) 75 MG EC tablet Take 1 tablet (75 mg total) by mouth 2 (two) times daily.  Arna Medici 88 MCG tablet Take 1 tablet by mouth once daily  . Flaxseed, Linseed, (FLAXSEED OIL PO) Take 1,300 mg by mouth daily.  . fluticasone (FLONASE) 50 MCG/ACT nasal spray Place 2 sprays into both nostrils  daily.  . Ginkgo Biloba Extract 60 MG CAPS Take 60 mg by mouth daily.  Marland Kitchen glimepiride (AMARYL) 2 MG tablet TAKE 1 TABLET BY MOUTH ONCE DAILY BEFORE BREAKFAST  . glucose blood (ONETOUCH VERIO) test strip Onetouch Verio Check blood sugar twice daily  . levocetirizine (XYZAL) 5 MG tablet Take 1 tablet (5 mg total) by mouth every evening.  Marland Kitchen losartan (COZAAR) 50 MG tablet Take 50 mg by mouth daily.  . Omega-3 Fatty Acids (FISH OIL) 1200 MG CAPS Take 1,200 mg by mouth daily.  Glory Rosebush Delica Lancets 26Z MISC 1 Device by Does not apply route 2 (two) times daily as needed.  . pantoprazole (PROTONIX) 40 MG tablet Take 1 tablet (40 mg total) by mouth daily.  . rosuvastatin (CRESTOR) 20 MG tablet Take 1 tablet by mouth once daily  . Semaglutide,0.25 or 0.'5MG'$ /DOS, (OZEMPIC, 0.25 OR 0.5 MG/DOSE,) 2 MG/1.5ML SOPN Inject 0.25 mg into the skin once a week.   No  facility-administered encounter medications on file as of 11/04/2019.    Allergies: Allergies  Allergen Reactions  . Lisinopril Cough    Family History: Family History  Problem Relation Age of Onset  . Diabetes Mother   . Hypertension Mother   . Hyperlipidemia Mother   . Gallbladder disease Mother   . Heart disease Mother   . Diabetes Sister   . Hypertension Father   . Hyperlipidemia Father   . Colon polyps Father   . Ovarian cancer Maternal Grandmother   . Gallbladder disease Maternal Grandmother   . Breast cancer Maternal Grandmother 53  . Colon cancer Paternal Grandfather   . Breast cancer Maternal Aunt        late 80s  . Heart disease Other        early cad  . Diabetes Brother   . Heart attack Brother   . Anesthesia problems Neg Hx   . Hypotension Neg Hx   . Malignant hyperthermia Neg Hx   . Pseudochol deficiency Neg Hx   . Esophageal cancer Neg Hx     Social History: Social History   Socioeconomic History  . Marital status: Married    Spouse name: Not on file  . Number of children: 3  . Years of education: Not on  file  . Highest education level: Not on file  Occupational History  . Occupation: CNA  Tobacco Use  . Smoking status: Never Smoker  . Smokeless tobacco: Never Used  Substance and Sexual Activity  . Alcohol use: No    Alcohol/week: 0.0 standard drinks  . Drug use: No  . Sexual activity: Not on file  Other Topics Concern  . Not on file  Social History Narrative   No exercise--  Walking and lifting pts at work   Right handed   Lives in single story home   Social Determinants of Health   Financial Resource Strain:   . Difficulty of Paying Living Expenses:   Food Insecurity:   . Worried About Charity fundraiser in the Last Year:   . Arboriculturist in the Last Year:   Transportation Needs:   . Film/video editor (Medical):   Marland Kitchen Lack of Transportation (Non-Medical):   Physical Activity:   . Days of Exercise per Week:   . Minutes of Exercise per Session:   Stress:   . Feeling of Stress :   Social Connections:   . Frequency of Communication with Friends and Family:   . Frequency of Social Gatherings with Friends and Family:   . Attends Religious Services:   . Active Member of Clubs or Organizations:   . Attends Archivist Meetings:   Marland Kitchen Marital Status:   Intimate Partner Violence:   . Fear of Current or Ex-Partner:   . Emotionally Abused:   Marland Kitchen Physically Abused:   . Sexually Abused:     Observations/Objective:   There were no vitals taken for this visit. No acute distress.  Alert and oriented.  Speech fluent and not dysarthric.  Language intact.  Eyes orthophoric on primary gaze.  Face symmetric.  Assessment and Plan:   1.  Migraine without aura, without status migrainosus, not intractable 2.  Vertigo, possibly migraine-associated.  1.  For preventative management, start zonisamide '25mg'$  titrating to '100mg'$  daily 2.  For abortive therapy, rizatriptan 3.  Limit use of pain relievers to no more than 2 days out of week to prevent risk of rebound or  medication-overuse headache. 4.  Keep  headache diary 5.  Exercise, hydration, caffeine cessation, sleep hygiene, monitor for and avoid triggers 6.  Consider:  magnesium citrate '400mg'$  daily, riboflavin '400mg'$  daily, and coenzyme Q10 '100mg'$  three times daily 7. If jaw pain worsens, may want to see dentist to evaluate for TMJ dysfunction 8.  Follow up 4 months.   Follow Up Instructions:    -I discussed the assessment and treatment plan with the patient. The patient was provided an opportunity to ask questions and all were answered. The patient agreed with the plan and demonstrated an understanding of the instructions.   The patient was advised to call back or seek an in-person evaluation if the symptoms worsen or if the condition fails to improve as anticipated.   Dudley Major, DO

## 2019-11-04 ENCOUNTER — Telehealth (INDEPENDENT_AMBULATORY_CARE_PROVIDER_SITE_OTHER): Payer: BC Managed Care – PPO | Admitting: Neurology

## 2019-11-04 ENCOUNTER — Encounter: Payer: Self-pay | Admitting: Neurology

## 2019-11-04 ENCOUNTER — Other Ambulatory Visit: Payer: Self-pay

## 2019-11-04 DIAGNOSIS — R42 Dizziness and giddiness: Secondary | ICD-10-CM | POA: Diagnosis not present

## 2019-11-04 DIAGNOSIS — G43009 Migraine without aura, not intractable, without status migrainosus: Secondary | ICD-10-CM | POA: Diagnosis not present

## 2019-11-04 MED ORDER — RIZATRIPTAN BENZOATE 10 MG PO TABS
ORAL_TABLET | ORAL | 3 refills | Status: DC
Start: 2019-11-04 — End: 2020-08-19

## 2019-11-04 MED ORDER — ZONISAMIDE 25 MG PO CAPS: ORAL_CAPSULE | ORAL | 0 refills | Status: DC

## 2019-11-05 ENCOUNTER — Encounter: Payer: BLUE CROSS/BLUE SHIELD | Admitting: Physical Therapy

## 2019-11-12 ENCOUNTER — Encounter: Payer: BLUE CROSS/BLUE SHIELD | Admitting: Physical Therapy

## 2019-11-19 ENCOUNTER — Encounter: Payer: BLUE CROSS/BLUE SHIELD | Admitting: Physical Therapy

## 2019-11-27 ENCOUNTER — Other Ambulatory Visit: Payer: Self-pay | Admitting: Family Medicine

## 2019-12-01 ENCOUNTER — Other Ambulatory Visit: Payer: Self-pay | Admitting: Family Medicine

## 2019-12-01 DIAGNOSIS — F411 Generalized anxiety disorder: Secondary | ICD-10-CM

## 2019-12-02 ENCOUNTER — Other Ambulatory Visit: Payer: Self-pay

## 2019-12-13 ENCOUNTER — Other Ambulatory Visit: Payer: Self-pay | Admitting: Family Medicine

## 2019-12-13 DIAGNOSIS — F411 Generalized anxiety disorder: Secondary | ICD-10-CM

## 2019-12-13 DIAGNOSIS — E785 Hyperlipidemia, unspecified: Secondary | ICD-10-CM

## 2020-02-05 ENCOUNTER — Encounter: Payer: Self-pay | Admitting: Family Medicine

## 2020-02-05 ENCOUNTER — Other Ambulatory Visit: Payer: Self-pay

## 2020-02-05 ENCOUNTER — Ambulatory Visit (INDEPENDENT_AMBULATORY_CARE_PROVIDER_SITE_OTHER): Payer: Self-pay | Admitting: Family Medicine

## 2020-02-05 VITALS — BP 108/74 | HR 93 | Temp 98.2°F | Ht 66.0 in | Wt 230.0 lb

## 2020-02-05 DIAGNOSIS — H6983 Other specified disorders of Eustachian tube, bilateral: Secondary | ICD-10-CM

## 2020-02-05 MED ORDER — PREDNISONE 20 MG PO TABS: 40.0000 mg | ORAL_TABLET | Freq: Every day | ORAL | 0 refills | Status: AC

## 2020-02-05 NOTE — Patient Instructions (Addendum)
OK to use Debrox (peroxide) in the ear to loosen up wax. Also recommend using a bulb syringe (for removing boogers from baby's noses) to flush through warm water. Do not use Q-tips as this can impact wax further.  Take 2 tabs of Amaryl while on prednisone. Let me know if you are still having sugar issues.  BeBird ear wax remover is what I use to look in my own ear.   Let us know if you need anything.

## 2020-02-05 NOTE — Progress Notes (Signed)
Chief Complaint  Patient presents with  . Ear Fullness    Subjective: Patient is a 54 y.o. female here for b/l ear fullness.  Both ears feel full and having popping 3-4 days ago. Single episode of dizziness 3 d ago, no issues since then. Denies fevers, pain, drainage, URI s/s's.    Past Medical History:  Diagnosis Date  . Anemia    anemia  when pregnant in 2002  . Anxiety   . Arthritis    "knees and back" (09/28/2016)  . Asthma 5 yrs ago    told she had asthma.. does not remember doctors name.   . Chronic lower back pain   . Depression    takes citalopram  . History of hiatal hernia   . History of kidney stones   . Hyperlipidemia   . Hypothyroidism   . LBBB (left bundle branch block)    rate-related; identified on stress test 03/21/16  . Migraine    "weekly" (09/28/2016)  . 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 Shortness of breath    with exertion  . Thyroid disease   . Type II diabetes mellitus ([place]) dx'd 2016    Objective: BP 108/74 (BP Location: Left Arm, Patient Position: Sitting, Cuff Size: Large)   Pulse 93   Temp 98.2 F (36.8 C) (Oral)   Ht 5\' 6"  (1.676 m)   Wt 230 lb (104.3 kg)   SpO2 96%   BMI 37.12 kg/m  General: Awake, appears stated age Ears: neg b/l Lungs: No accessory muscle use Psych: Age appropriate judgment and insight, normal affect and mood  Assessment and Plan: Dysfunction of both eustachian tubes - Plan: predniSONE (DELTASONE) 20 MG tablet  Pred burst. Take extra tab of Amaryl while on pred.  Fu prn.  The patient voiced understanding and agreement to the plan.  [place], DO 02/05/20  2:56 PM

## 2020-02-25 ENCOUNTER — Other Ambulatory Visit: Payer: Self-pay

## 2020-02-25 ENCOUNTER — Encounter: Payer: Self-pay | Admitting: Family Medicine

## 2020-02-25 ENCOUNTER — Ambulatory Visit (INDEPENDENT_AMBULATORY_CARE_PROVIDER_SITE_OTHER): Payer: Self-pay | Admitting: Family Medicine

## 2020-02-25 VITALS — BP 104/80 | HR 84 | Temp 98.3°F | Resp 18 | Ht 66.0 in | Wt 232.4 lb

## 2020-02-25 DIAGNOSIS — E1169 Type 2 diabetes mellitus with other specified complication: Secondary | ICD-10-CM

## 2020-02-25 DIAGNOSIS — I10 Essential (primary) hypertension: Secondary | ICD-10-CM

## 2020-02-25 DIAGNOSIS — E1151 Type 2 diabetes mellitus with diabetic peripheral angiopathy without gangrene: Secondary | ICD-10-CM

## 2020-02-25 DIAGNOSIS — E785 Hyperlipidemia, unspecified: Secondary | ICD-10-CM

## 2020-02-25 DIAGNOSIS — IMO0002 Reserved for concepts with insufficient information to code with codable children: Secondary | ICD-10-CM

## 2020-02-25 DIAGNOSIS — E1165 Type 2 diabetes mellitus with hyperglycemia: Secondary | ICD-10-CM

## 2020-02-25 LAB — POCT GLYCOSYLATED HEMOGLOBIN (HGB A1C): Hemoglobin A1C: 8.3 % — AB (ref 4.0–5.6)

## 2020-02-25 NOTE — Assessment & Plan Note (Signed)
Encouraged heart healthy diet, increase exercise, avoid trans fats, consider a krill oil cap daily 

## 2020-02-25 NOTE — Patient Instructions (Signed)
Carbohydrate Counting for Diabetes Mellitus, Adult  Carbohydrate counting is a method of keeping track of how many carbohydrates you eat. Eating carbohydrates naturally increases the amount of sugar (glucose) in the blood. Counting how many carbohydrates you eat helps keep your blood glucose within normal limits, which helps you manage your diabetes (diabetes mellitus). It is important to know how many carbohydrates you can safely have in each meal. This is different for every person. A diet and nutrition specialist (registered dietitian) can help you make a meal plan and calculate how many carbohydrates you should have at each meal and snack. Carbohydrates are found in the following foods:  Grains, such as breads and cereals.  Dried beans and soy products.  Starchy vegetables, such as potatoes, peas, and corn.  Fruit and fruit juices.  Milk and yogurt.  Sweets and snack foods, such as cake, cookies, candy, chips, and soft drinks. How do I count carbohydrates? There are two ways to count carbohydrates in food. You can use either of the methods or a combination of both. Reading "Nutrition Facts" on packaged food The "Nutrition Facts" list is included on the labels of almost all packaged foods and beverages in the [place] It includes:  The serving size.  Information about nutrients in each serving, including the grams (g) of carbohydrate per serving. To use the "Nutrition Facts":  Decide how many servings you will have.  Multiply the number of servings by the number of carbohydrates per serving.  The resulting number is the total amount of carbohydrates that you will be having. Learning standard serving sizes of other foods When you eat carbohydrate foods that are not packaged or do not include "Nutrition Facts" on the label, you need to measure the servings in order to count the amount of carbohydrates:  Measure the foods that you will eat with a food scale or measuring cup, if  needed.  Decide how many standard-size servings you will eat.  Multiply the number of servings by 15. Most carbohydrate-rich foods have about 15 g of carbohydrates per serving. ? For example, if you eat 8 oz (170 g) of strawberries, you will have eaten 2 servings and 30 g of carbohydrates (2 servings x 15 g = 30 g).  For foods that have more than one food mixed, such as soups and casseroles, you must count the carbohydrates in each food that is included. The following list contains standard serving sizes of common carbohydrate-rich foods. Each of these servings has about 15 g of carbohydrates:   hamburger bun or  English muffin.   oz (15 mL) syrup.   oz (14 g) jelly.  1 slice of bread.  1 six-inch tortilla.  3 oz (85 g) cooked rice or pasta.  4 oz (113 g) cooked dried beans.  4 oz (113 g) starchy vegetable, such as peas, corn, or potatoes.  4 oz (113 g) hot cereal.  4 oz (113 g) mashed potatoes or  of a large baked potato.  4 oz (113 g) canned or frozen fruit.  4 oz (120 mL) fruit juice.  4-6 crackers.  6 chicken nuggets.  6 oz (170 g) unsweetened dry cereal.  6 oz (170 g) plain fat-free yogurt or yogurt sweetened with artificial sweeteners.  8 oz (240 mL) milk.  8 oz (170 g) fresh fruit or one small piece of fruit.  24 oz (680 g) popped popcorn. Example of carbohydrate counting Sample meal  3 oz (85 g) chicken breast.  6 oz (170 g)   brown rice.  4 oz (113 g) corn.  8 oz (240 mL) milk.  8 oz (170 g) strawberries with sugar-free whipped topping. Carbohydrate calculation 1. Identify the foods that contain carbohydrates: ? Rice. ? Corn. ? Milk. ? Strawberries. 2. Calculate how many servings you have of each food: ? 2 servings rice. ? 1 serving corn. ? 1 serving milk. ? 1 serving strawberries. 3. Multiply each number of servings by 15 g: ? 2 servings rice x 15 g = 30 g. ? 1 serving corn x 15 g = 15 g. ? 1 serving milk x 15 g = 15 g. ? 1  serving strawberries x 15 g = 15 g. 4. Add together all of the amounts to find the total grams of carbohydrates eaten: ? 30 g + 15 g + 15 g + 15 g = 75 g of carbohydrates total. Summary  Carbohydrate counting is a method of keeping track of how many carbohydrates you eat.  Eating carbohydrates naturally increases the amount of sugar (glucose) in the blood.  Counting how many carbohydrates you eat helps keep your blood glucose within normal limits, which helps you manage your diabetes.  A diet and nutrition specialist (registered dietitian) can help you make a meal plan and calculate how many carbohydrates you should have at each meal and snack. This information is not intended to replace advice given to you by your health care provider. Make sure you discuss any questions you have with your health care provider. Document Revised: 01/03/2017 Document Reviewed: 11/23/2015 Elsevier Patient Education  2020 Elsevier Inc.  

## 2020-02-25 NOTE — Progress Notes (Deleted)
Subjective:     Felicia Solis is a 54 y.o. female and is here for a comprehensive physical exam. The patient reports {problems:16946}.  Social History   Socioeconomic History  . Marital status: Married    Spouse name: Not on file  . Number of children: 3  . Years of education: Not on file  . Highest education level: Not on file  Occupational History  . Occupation: CNA  Tobacco Use  . Smoking status: Never Smoker  . Smokeless tobacco: Never Used  Vaping Use  . Vaping Use: Never used  Substance and Sexual Activity  . Alcohol use: No    Alcohol/week: 0.0 standard drinks  . Drug use: No  . Sexual activity: Not on file  Other Topics Concern  . Not on file  Social History Narrative   No exercise--  Walking and lifting pts at work   Right handed   Lives in single story home   Social Determinants of Health   Financial Resource Strain:   . Difficulty of Paying Living Expenses: Not on file  Food Insecurity:   . Worried About Charity fundraiser in the Last Year: Not on file  . Ran Out of Food in the Last Year: Not on file  Transportation Needs:   . Lack of Transportation (Medical): Not on file  . Lack of Transportation (Non-Medical): Not on file  Physical Activity:   . Days of Exercise per Week: Not on file  . Minutes of Exercise per Session: Not on file  Stress:   . Feeling of Stress : Not on file  Social Connections:   . Frequency of Communication with Friends and Family: Not on file  . Frequency of Social Gatherings with Friends and Family: Not on file  . Attends Religious Services: Not on file  . Active Member of Clubs or Organizations: Not on file  . Attends Archivist Meetings: Not on file  . Marital Status: Not on file  Intimate Partner Violence:   . Fear of Current or Ex-Partner: Not on file  . Emotionally Abused: Not on file  . Physically Abused: Not on file  . Sexually Abused: Not on file   Health Maintenance  Topic Date Due  . Hepatitis C  Screening  Never done  . PNEUMOCOCCAL POLYSACCHARIDE VACCINE AGE 56-64 HIGH RISK  Never done  . COVID-19 Vaccine (1) Never done  . HEMOGLOBIN A1C  11/24/2019  . INFLUENZA VACCINE  01/24/2020  . FOOT EXAM  02/23/2020  . PAP SMEAR-Modifier  09/11/2020  . MAMMOGRAM  10/08/2020  . OPHTHALMOLOGY EXAM  10/25/2020  . COLONOSCOPY  12/02/2024  . TETANUS/TDAP  09/28/2027  . HIV Screening  Completed    {Common ambulatory SmartLinks:19316}  Review of Systems {ros; complete:30496}   Objective:    {Exam, Complete:[phone]}    Assessment:    Healthy female exam. ***     Plan:     See After Visit Summary for Counseling Recommendations

## 2020-02-25 NOTE — Assessment & Plan Note (Signed)
hgba1c --8.2 minimize simple carbs. Increase exercise as tolerated. Continue current meds Pt wants to work on diet and exercise and recheck 3 months

## 2020-02-25 NOTE — Progress Notes (Signed)
Patient ID: Felicia Solis, female    DOB: 05-Mar-1966  Age: 54 y.o. MRN: [id number]    Subjective:  Subjective  HPI ASHTON SABINE presents for f/u dm, chol and bp No complaints She has a new job and ins will not kick in for 2 months   HPI HYPERTENSION   Blood pressure range-not checking   Chest pain- no      Dyspnea- no Lightheadedness- no   Edema- no  Other side effects - no   Medication compliance: good Low salt diet- yes     DIABETES    Blood Sugar ranges-not checking   Polyuria- no New Visual problems- no  Hypoglycemic symptoms- no  Other side effects-no Medication compliance - no Last eye exam- due    HYPERLIPIDEMIA  Medication compliance- good RUQ pain- no  Muscle aches- no Other side effects-no    Review of Systems  Constitutional: Negative for appetite change, diaphoresis, fatigue and unexpected weight change.  Eyes: Negative for pain, redness and visual disturbance.  Respiratory: Negative for cough, chest tightness, shortness of breath and wheezing.   Cardiovascular: Negative for chest pain, palpitations and leg swelling.  Endocrine: Negative for cold intolerance, heat intolerance, polydipsia, polyphagia and polyuria.  Genitourinary: Negative for difficulty urinating, dysuria and frequency.  Neurological: Negative for dizziness, light-headedness, numbness and headaches.    History Past Medical History:  Diagnosis Date  . Anemia    anemia  when pregnant in 2002  . Anxiety   . Arthritis    "knees and back" (09/28/2016)  . Asthma 5 yrs ago    told she had asthma.. does not remember doctors name.   . Chronic lower back pain   . Depression    takes citalopram  . History of hiatal hernia   . History of kidney stones   . Hyperlipidemia   . Hypothyroidism   . LBBB (left bundle branch block)    rate-related; identified on stress test 03/21/16  . Migraine    "weekly" (09/28/2016)  . Positive TB test 2008   pt states she took 9 months of medicine for  positive tb skin test  . Shortness of breath    with exertion  . Thyroid disease   . Type II diabetes mellitus ([place]) dx'd 2016    She has a past surgical history that includes Endometrial ablation; Tubal ligation; laparoscopy; Dilation and curettage of uterus (1999); Thyroidectomy (09/28/2011); Thyroidectomy (09/28/2016); Thyroidectomy (Left, 09/28/2016); and Extracorporeal shock wave lithotripsy (Right, 04/02/2019).   Her family history includes Breast cancer in her maternal aunt; Breast cancer (age of onset: 39) in her maternal grandmother; Colon cancer in her paternal grandfather; Colon polyps in her father; Diabetes in her brother, mother, and sister; Gallbladder disease in her maternal grandmother and mother; Heart attack in her brother; Heart disease in her mother and another family member; Hyperlipidemia in her father and mother; Hypertension in her father and mother; Ovarian cancer in her maternal grandmother.She reports that she has never smoked. She has never used smokeless tobacco. She reports that she does not drink alcohol and does not use drugs.  Current Outpatient Medications on File Prior to Visit  Medication Sig Dispense Refill  . acetaminophen (TYLENOL) 500 MG tablet Take 1,000 mg by mouth every 6 (six) hours as needed for mild pain.    Marland Kitchen ALPRAZolam (XANAX) 0.5 MG tablet Take 1 tablet (0.5 mg total) by mouth 3 (three) times daily as needed for anxiety. 10 tablet 0  . aspirin EC 81 MG  tablet Take 1 tablet (81 mg total) by mouth daily. 90 tablet 3  . blood glucose meter kit and supplies KIT Dispense based on patient and insurance preference. Use up to four times daily as directed. (FOR ICD-9 250.00, 250.01). 1 each 0  . Cholecalciferol (VITAMIN D3) 2000 units TABS Take 2,000 Units by mouth daily.     . citalopram (CELEXA) 20 MG tablet Take 1 tablet by mouth once daily 90 tablet 1  . cyanocobalamin 500 MCG tablet Take 1,000 mcg by mouth daily.     . cyclobenzaprine (FLEXERIL) 10 MG  tablet Take 1 tablet (10 mg total) by mouth 3 (three) times daily as needed for muscle spasms. 30 tablet 0  . diclofenac (VOLTAREN) 75 MG EC tablet Take 1 tablet (75 mg total) by mouth 2 (two) times daily. 60 tablet 1  . EUTHYROX 88 MCG tablet Take 1 tablet by mouth once daily 90 tablet 2  . Flaxseed, Linseed, (FLAXSEED OIL PO) Take 1,300 mg by mouth daily.    . fluticasone (FLONASE) 50 MCG/ACT nasal spray Place 2 sprays into both nostrils daily. 16 g 6  . Ginkgo Biloba Extract 60 MG CAPS Take 60 mg by mouth daily.    Marland Kitchen glimepiride (AMARYL) 2 MG tablet TAKE 1 TABLET BY MOUTH ONCE DAILY BEFORE BREAKFAST 90 tablet 1  . glucose blood (ONETOUCH VERIO) test strip Onetouch Verio Check blood sugar twice daily 100 each 12  . losartan (COZAAR) 50 MG tablet Take 1 tablet by mouth once daily 90 tablet 1  . Omega-3 Fatty Acids (FISH OIL) 1200 MG CAPS Take 1,200 mg by mouth daily.    Glory Rosebush Delica Lancets 29B MISC 1 Device by Does not apply route 2 (two) times daily as needed. 60 each 5  . pantoprazole (PROTONIX) 40 MG tablet Take 1 tablet (40 mg total) by mouth daily. 90 tablet 3  . rizatriptan (MAXALT) 10 MG tablet Take 1 tablet earliest onset of headache.  May repeat in 2 hours if needed.  Maximum 2 tablets in 24 hours. 10 tablet 3  . rosuvastatin (CRESTOR) 20 MG tablet Take 1 tablet by mouth once daily 90 tablet 1  . Semaglutide,0.25 or 0.5MG /DOS, (OZEMPIC, 0.25 OR 0.5 MG/DOSE,) 2 MG/1.5ML SOPN Inject 0.25 mg into the skin once a week. 1.5 mL 5  . zonisamide (ZONEGRAN) 25 MG capsule Take 1 capsule daily for 1 week, then 2 capsules daily for 1 week, then 3 capsules daily for 1 week, then 4 capsules daily. 120 capsule 0   No current facility-administered medications on file prior to visit.     Objective:  Objective  Physical Exam Vitals and nursing note reviewed.  Constitutional:      Appearance: She is well-developed.  HENT:     Head: Normocephalic and atraumatic.  Eyes:      Conjunctiva/sclera: Conjunctivae normal.  Neck:     Thyroid: No thyromegaly.     Vascular: No carotid bruit or JVD.  Cardiovascular:     Rate and Rhythm: Normal rate and regular rhythm.     Heart sounds: Normal heart sounds. No murmur heard.   Pulmonary:     Effort: Pulmonary effort is normal. No respiratory distress.     Breath sounds: Normal breath sounds. No wheezing or rales.  Chest:     Chest wall: No tenderness.  Musculoskeletal:     Cervical back: Normal range of motion and neck supple.  Neurological:     Mental Status: She is alert and oriented  to person, place, and time.    BP 104/80 (BP Location: Right Arm, Patient Position: Sitting, Cuff Size: Large)   Pulse 84   Temp 98.3 F (36.8 C) (Oral)   Resp 18   Ht 5\' 6"  (1.676 m)   Wt 232 lb 6.4 oz (105.4 kg)   SpO2 99%   BMI 37.51 kg/m  Wt Readings from Last 3 Encounters:  02/25/20 232 lb 6.4 oz (105.4 kg)  02/05/20 230 lb (104.3 kg)  10/26/19 222 lb (100.7 kg)     Lab Results  Component Value Date   WBC 9.1 04/20/2019   HGB 11.8 (L) 04/20/2019   HCT 36.5 04/20/2019   PLT 324.0 04/20/2019   GLUCOSE 113 (H) 05/26/2019   CHOL 100 05/26/2019   TRIG 56.0 05/26/2019   HDL 34.70 (L) 05/26/2019   LDLCALC 54 05/26/2019   ALT 17 05/26/2019   AST 14 05/26/2019   NA 143 05/26/2019   K 3.7 05/26/2019   CL 107 05/26/2019   CREATININE 0.47 05/26/2019   BUN 10 05/26/2019   CO2 26 05/26/2019   TSH 0.85 07/28/2019   HGBA1C 8.3 (A) 02/25/2020   MICROALBUR 2.5 (H) 04/04/2017    MM Digital Diagnostic Unilat R  Result Date: 10/09/2019 CLINICAL DATA:  Recall from screening mammography with tomosynthesis, calcifications involving the UPPER OUTER QUADRANT of the RIGHT breast at MIDDLE to POSTERIOR depth. EXAM: DIGITAL DIAGNOSTIC RIGHT MAMMOGRAM WITH CAD COMPARISON:  Previous exam(s). ACR Breast Density Category b: There are scattered areas of fibroglandular density. FINDINGS: Standard spot magnification CC and mediolateral  views of the RIGHT breast calcifications and a standard 2D full field mediolateral view of the RIGHT breast were obtained. Spot magnification images demonstrate a 5 mm group of punctate and dystrophic appearing calcifications in the UPPER OUTER QUADRANT at MIDDLE to POSTERIOR depth. On the screening tomosynthesis images, the calcifications are likely associated with a circumscribed mass, though the mass is not conspicuous on the spot magnification images. There is no associated architectural distortion. No suspicious findings elsewhere on the full field mediolateral image. The full field mediolateral image was processed with CAD. IMPRESSION: Likely benign 5 mm group of calcifications involving the UPPER OUTER RIGHT breast at MIDDLE to POSTERIOR depth, possibly fibroadenomatoid. RECOMMENDATION: Diagnostic RIGHT mammogram with spot magnification views of the RIGHT breast calcifications in 6 months. The option of core needle biopsy was discussed with the patient, but she wishes to proceed with short-term imaging follow-up. She was told that should she change her mind and wish to proceed with biopsy, she can contact the [facility]. I have discussed the findings and recommendations with the patient. If applicable, a reminder letter will be sent to the patient regarding the next appointment. BI-RADS CATEGORY  3: Probably benign. Electronically Signed   By: [facility] M.D.   On: 10/09/2019 16:14     Assessment & Plan:  Plan  I am having Anylah B. Cage maintain her acetaminophen, vitamin B-12, Ginkgo Biloba Extract, (Flaxseed, Linseed, (FLAXSEED OIL PO)), Fish Oil, Vitamin D3, aspirin EC, blood glucose meter kit and supplies, ALPRAZolam, pantoprazole, fluticasone, OneTouch Delica Lancets 30G, OneTouch Verio, Ozempic (0.25 or 0.5 MG/DOSE), cyclobenzaprine, diclofenac, zonisamide, rizatriptan, Euthyrox, citalopram, losartan, glimepiride, and rosuvastatin.  No orders of the defined types  were placed in this encounter.   Problem List Items Addressed This Visit      Unprioritized   DM (diabetes mellitus) type II uncontrolled, periph vascular disorder (HCC)    hgba1c --8.2 minimize  simple carbs. Increase exercise as tolerated. Continue current meds Pt wants to work on diet and exercise and recheck 3 months       Essential hypertension - Primary    Well controlled, no changes to meds. Encouraged heart healthy diet such as the DASH diet and exercise as tolerated.       Relevant Orders   Comprehensive metabolic panel   Hyperlipidemia associated with type 2 diabetes mellitus ([place])    Encouraged heart healthy diet, increase exercise, avoid trans fats, consider a krill oil cap daily      Uncontrolled type 2 diabetes mellitus with hyperglycemia (HCC)   Relevant Orders   POCT glycosylated hemoglobin (Hb A1C) (Completed)    Other Visit Diagnoses    Hyperlipidemia, unspecified hyperlipidemia type          Follow-up: Return in about 3 months (around 05/26/2020), or if symptoms worsen or fail to improve, for annual exam, fasting.  [name], DO

## 2020-02-25 NOTE — Assessment & Plan Note (Signed)
Well controlled, no changes to meds. Encouraged heart healthy diet such as the DASH diet and exercise as tolerated.  °

## 2020-02-26 LAB — COMPREHENSIVE METABOLIC PANEL
AG Ratio: 1.8 (calc) (ref 1.0–2.5)
ALT: 23 U/L (ref 6–29)
AST: 20 U/L (ref 10–35)
Albumin: 4.5 g/dL (ref 3.6–5.1)
Alkaline phosphatase (APISO): 38 U/L (ref 37–153)
BUN: 14 mg/dL (ref 7–25)
CO2: 27 mmol/L (ref 20–32)
Calcium: 9.5 mg/dL (ref 8.6–10.4)
Chloride: 105 mmol/L (ref 98–110)
Creat: 0.51 mg/dL (ref 0.50–1.05)
Globulin: 2.5 g/dL (calc) (ref 1.9–3.7)
Glucose, Bld: 179 mg/dL — ABNORMAL HIGH (ref 65–99)
Potassium: 3.7 mmol/L (ref 3.5–5.3)
Sodium: 142 mmol/L (ref 135–146)
Total Bilirubin: 0.3 mg/dL (ref 0.2–1.2)
Total Protein: 7 g/dL (ref 6.1–8.1)

## 2020-03-17 NOTE — Progress Notes (Deleted)
NEUROLOGY FOLLOW UP OFFICE NOTE  Felicia Solis [id number]  HISTORY OF PRESENT ILLNESS: Felicia Solis. [name] is a 54 year old female who follows up for migraine.  UPDATE: Started on zonisamide in May. ***  Current NSAIDs:  ASA 51m daily Current analgesics:  Tylenol; Excedrin Migraine (hasn't been able to find in the store, which works better than Tylenol). Current triptan:  Rizatriptan 160mCurrent muscle relaxant:  Flexeril 1058murrent antihypertensive:  Losartan Current antidepressant:  Citalopram 62m75mrrent antiepileptic:  zonisamide 100mg39mly Current antihistamine/decongestants:  Astelin; Flonase  HISTORY: In December 2020, she began experiencing dizzinessdescribed as spinning sensation. It first occurred upon getting up after she was bent over in the kitchen to clean the floor. However, it occurs spontaneously as well. It would last a couple of minutes. No visual disturbance, nausea, vomiting, photophobia, phonophobia, weakness, slurred speech or headache. She also felt that her left ear was clogged up and would pop. She initially went to her PCP who found cerumen impaction which was removed and she was prescribed seasonal allergies and amoxicillin for possible otitis media. However, she continued to have these spells. She was evaluated by ENT, Dr. RosenConstance Holster exam was unremarkable and she reported no recent episodes. She was advised to follow up if symptoms return. However, the vertigo returned. She also started developing aural fullness in her right ear as well (like she was on an airplane) associated with muffled hearing. No tinnitus. On one occasion, the episode was followed by a 7/10 right pressure-like retro-orbital headache lasting for about an hour. Associated with photophobia and phonophobia but not nausea, vomiting or visual disturbance.  On occasion, she has a pressure on the top of her head. She reports remote history of migraines. In 2021, she developed a  right cervical radiculopathy with pain and numbness. MRI of cervical spine from 12/24/2018 was personally reviewed and showed C6-7 disc extrusion with right-sided spinal stenosis and neural foraminal stenosis affecting the C7 nerve root.   Past NSAIDs:  Diclofenac; meloxicam; naproxen Past analgesic:  Tylenol; tramadol Past anti-emetic:  Zofran 8mg P62m muscle relaxant:  methocarbamol Past antihypertensive:  HCTZ; lisinopril Past antidepressant:  Sertraline  Past antiepileptic:  Gabapentin Past antihistamine/decongestant:  Claritin  PAST MEDICAL HISTORY: Past Medical History:  Diagnosis Date   Anemia    anemia  when pregnant in 2002   Anxiety    Arthritis    "knees and back" (09/28/2016)   Asthma 5 yrs ago    told she had asthma.. does not remember doctors name.    Chronic lower back pain    Depression    takes citalopram   History of hiatal hernia    History of kidney stones    Hyperlipidemia    Hypothyroidism    LBBB (left bundle branch block)    rate-related; identified on stress test 03/21/16   Migraine    "weekly" (09/28/2016)   Positive TB test 2008   pt states she took 9 months of medicine for positive tb skin test   Shortness of breath    with exertion   Thyroid disease    Type II diabetes mellitus (HCC) [place] 2016    MEDICATIONS: Current Outpatient Medications on File Prior to Visit  Medication Sig Dispense Refill   acetaminophen (TYLENOL) 500 MG tablet Take 1,000 mg by mouth every 6 (six) hours as needed for mild pain.     ALPRAZolam (XANAX) 0.5 MG tablet Take 1 tablet (0.5 mg total) by mouth 3 (three) times daily  as needed for anxiety. 10 tablet 0   aspirin EC 81 MG tablet Take 1 tablet (81 mg total) by mouth daily. 90 tablet 3   blood glucose meter kit and supplies KIT Dispense based on patient and insurance preference. Use up to four times daily as directed. (FOR ICD-9 250.00, 250.01). 1 each 0   Cholecalciferol (VITAMIN D3) 2000 units  TABS Take 2,000 Units by mouth daily.      citalopram (CELEXA) 20 MG tablet Take 1 tablet by mouth once daily 90 tablet 1   cyanocobalamin 500 MCG tablet Take 1,000 mcg by mouth daily.      cyclobenzaprine (FLEXERIL) 10 MG tablet Take 1 tablet (10 mg total) by mouth 3 (three) times daily as needed for muscle spasms. 30 tablet 0   diclofenac (VOLTAREN) 75 MG EC tablet Take 1 tablet (75 mg total) by mouth 2 (two) times daily. 60 tablet 1   EUTHYROX 88 MCG tablet Take 1 tablet by mouth once daily 90 tablet 2   Flaxseed, Linseed, (FLAXSEED OIL PO) Take 1,300 mg by mouth daily.     fluticasone (FLONASE) 50 MCG/ACT nasal spray Place 2 sprays into both nostrils daily. 16 g 6   Ginkgo Biloba Extract 60 MG CAPS Take 60 mg by mouth daily.     glimepiride (AMARYL) 2 MG tablet TAKE 1 TABLET BY MOUTH ONCE DAILY BEFORE BREAKFAST 90 tablet 1   glucose blood (ONETOUCH VERIO) test strip Onetouch Verio Check blood sugar twice daily 100 each 12   losartan (COZAAR) 50 MG tablet Take 1 tablet by mouth once daily 90 tablet 1   Omega-3 Fatty Acids (FISH OIL) 1200 MG CAPS Take 1,200 mg by mouth daily.     OneTouch Delica Lancets 29J MISC 1 Device by Does not apply route 2 (two) times daily as needed. 60 each 5   pantoprazole (PROTONIX) 40 MG tablet Take 1 tablet (40 mg total) by mouth daily. 90 tablet 3   rizatriptan (MAXALT) 10 MG tablet Take 1 tablet earliest onset of headache.  May repeat in 2 hours if needed.  Maximum 2 tablets in 24 hours. 10 tablet 3   rosuvastatin (CRESTOR) 20 MG tablet Take 1 tablet by mouth once daily 90 tablet 1   Semaglutide,0.25 or 0.5MG/DOS, (OZEMPIC, 0.25 OR 0.5 MG/DOSE,) 2 MG/1.5ML SOPN Inject 0.25 mg into the skin once a week. 1.5 mL 5   zonisamide (ZONEGRAN) 25 MG capsule Take 1 capsule daily for 1 week, then 2 capsules daily for 1 week, then 3 capsules daily for 1 week, then 4 capsules daily. 120 capsule 0   No current facility-administered medications on file prior  to visit.    ALLERGIES: Allergies  Allergen Reactions   Lisinopril Cough    FAMILY HISTORY: Family History  Problem Relation Age of Onset   Diabetes Mother    Hypertension Mother    Hyperlipidemia Mother    Gallbladder disease Mother    Heart disease Mother    Diabetes Sister    Hypertension Father    Hyperlipidemia Father    Colon polyps Father    Ovarian cancer Maternal Grandmother    Gallbladder disease Maternal Grandmother    Breast cancer Maternal Grandmother 43   Colon cancer Paternal Grandfather    Breast cancer Maternal Aunt        late 56s   Heart disease Other        early cad   Diabetes Brother    Heart attack Brother  Anesthesia problems Neg Hx    Hypotension Neg Hx    Malignant hyperthermia Neg Hx    Pseudochol deficiency Neg Hx    Esophageal cancer Neg Hx     SOCIAL HISTORY: Social History   Socioeconomic History   Marital status: Married    Spouse name: Not on file   Number of children: 3   Years of education: Not on file   Highest education level: Not on file  Occupational History   Occupation: CNA  Tobacco Use   Smoking status: Never Smoker   Smokeless tobacco: Never Used  Scientific laboratory technician Use: Never used  Substance and Sexual Activity   Alcohol use: No    Alcohol/week: 0.0 standard drinks   Drug use: No   Sexual activity: Not on file  Other Topics Concern   Not on file  Social History Narrative   No exercise--  Walking and lifting pts at work   Right handed   Lives in single story home   Social Determinants of Health   Financial Resource Strain:    Difficulty of Paying Living Expenses: Not on file  Food Insecurity:    Worried About Charity fundraiser in the Last Year: Not on file   [employer] of Food in the Last Year: Not on file  Transportation Needs:    Lack of Transportation (Medical): Not on file   Lack of Transportation (Non-Medical): Not on file  Physical Activity:    Days  of Exercise per Week: Not on file   Minutes of Exercise per Session: Not on file  Stress:    Feeling of Stress : Not on file  Social Connections:    Frequency of Communication with Friends and Family: Not on file   Frequency of Social Gatherings with Friends and Family: Not on file   Attends Religious Services: Not on file   Active Member of Clubs or Organizations: Not on file   Attends Archivist Meetings: Not on file   Marital Status: Not on file  Intimate Partner Violence:    Fear of Current or Ex-Partner: Not on file   Emotionally Abused: Not on file   Physically Abused: Not on file   Sexually Abused: Not on file    PHYSICAL EXAM: *** General: No acute distress.  Patient appears well-groomed.   Head:  Normocephalic/atraumatic Eyes:  Fundi examined but not visualized Neck: supple, no paraspinal tenderness, full range of motion Heart:  Regular rate and rhythm Lungs:  Clear to auscultation bilaterally Back: No paraspinal tenderness Neurological Exam: alert and oriented to person, place, and time. Attention span and concentration intact, recent and remote memory intact, fund of knowledge intact.  Speech fluent and not dysarthric, language intact.  CN II-XII intact. Bulk and tone normal, muscle strength 5/5 throughout.  Sensation to light touch, temperature and vibration intact.  Deep tendon reflexes 2+ throughout, toes downgoing.  Finger to nose and heel to shin testing intact.  Gait normal, Romberg negative.  IMPRESSION: 1.  Migraine without aura, without status migrainosus, not intractable 2.  Vertigo, possibly migraine-associated  PLAN: ***  Metta Clines, DO  CC: ***

## 2020-03-21 ENCOUNTER — Ambulatory Visit: Payer: BC Managed Care – PPO | Admitting: Neurology

## 2020-04-11 ENCOUNTER — Other Ambulatory Visit: Payer: Self-pay | Admitting: Family Medicine

## 2020-04-11 ENCOUNTER — Ambulatory Visit
Admission: RE | Admit: 2020-04-11 | Discharge: 2020-04-11 | Disposition: A | Discharge status: Home / Self Care | Payer: No Typology Code available for payment source | Source: Ambulatory Visit | Attending: Family Medicine | Admitting: Family Medicine

## 2020-04-11 ENCOUNTER — Other Ambulatory Visit: Payer: Self-pay

## 2020-04-11 DIAGNOSIS — R921 Mammographic calcification found on diagnostic imaging of breast: Secondary | ICD-10-CM

## 2020-04-18 ENCOUNTER — Telehealth: Payer: Self-pay | Admitting: Family Medicine

## 2020-04-18 NOTE — Telephone Encounter (Signed)
Patient would like to know if we have any samples :   Semaglutide,0.25 or 0.5MG /DOS, (OZEMPIC, 0.25 OR 0.5 MG/DOSE,) 2 MG/1.5ML SOPN [[id number]]

## 2020-04-19 NOTE — Telephone Encounter (Signed)
I do not believe we have any samples

## 2020-04-19 NOTE — Telephone Encounter (Signed)
Please advise. Pt last seen on 02/25/20

## 2020-04-20 NOTE — Telephone Encounter (Signed)
We have Ozempic samples. Would you like a visit?

## 2020-04-20 NOTE — Telephone Encounter (Signed)
Ok to have one

## 2020-04-20 NOTE — Telephone Encounter (Signed)
Pt called. LVM regarding Ozempic samples. Advised patient to call back about picking up sample

## 2020-04-22 NOTE — Telephone Encounter (Signed)
Patient will come today to get ozempic

## 2020-05-04 ENCOUNTER — Ambulatory Visit (INDEPENDENT_AMBULATORY_CARE_PROVIDER_SITE_OTHER): Payer: Self-pay | Admitting: Family Medicine

## 2020-05-04 ENCOUNTER — Other Ambulatory Visit: Payer: Self-pay

## 2020-05-04 ENCOUNTER — Encounter: Payer: Self-pay | Admitting: Family Medicine

## 2020-05-04 VITALS — BP 130/84 | Ht 66.0 in | Wt 227.0 lb

## 2020-05-04 DIAGNOSIS — M654 Radial styloid tenosynovitis [de Quervain]: Secondary | ICD-10-CM

## 2020-05-04 NOTE — Patient Instructions (Signed)
Thank you for coming in to see [place] today! Please see below to review our plan for today's visit:   1.   Please start using the thumb spica brace as much as possible, you may take it off for showers and to apply the cream. 2.   Please start using Voltaren (diclofenac) cream, which you can get at any pharmacy.  Please apply 3 times per day. 3.   Lets plan to follow-up in 6 weeks.   Please call the clinic at [phone] if your symptoms worsen or you have any concerns. It was our pleasure to serve you.       Dr. Dagoberto Ligas Dr. Karlton Lemon [facility]

## 2020-05-04 NOTE — Progress Notes (Signed)
   PCP: [name], DO  Subjective:   HPI: Patient is a 54 y.o. female here for reevaluation of left wrist pain.  She was previously diagnosed with de Quervain's tenosynovitis in 08/2019, at that time she was treated conservatively with thumb spica brace and NSAIDs, she did not have relieved and therefore received a corticosteroid injection under ultrasound guidance in 10/2019.  She had improvement in symptoms for 1 to 2 months, however over the last month or 2 has had recurrence and worsening symptoms.  She has pain primarily over the radial aspect of her dorsal wrist.  She also has some pain more proximally in her dorsal forearm and extending up into her shoulder.  No numbness or tingling.  No neck pain.  No weakness.  Review of Systems:  Per HPI.   [place], medications and smoking status reviewed.      Objective:  Physical Exam:  No flowsheet data found.   Gen: awake, alert, NAD, comfortable in exam room Pulm: breathing unlabored  Left wrist:  Inspection: Very mild swelling over the first dorsal compartment.  No evidence of erythema, ecchymosis ROM: Intact ROM to wrist flexion/extension, ulnar/radial deviation.  Some pain with radial deviation.  Pain with extension of the thumb. Strength: 5/5 strength to resisted wrist flexion/extension, ulnar/radial deviation  Palpation: Tender to palpation over the base of the thumb on the dorsal aspect.  No tenderness to palpation to distal radius/ulna, DRUJ, scaphoid, scapholunate joint, metacarpals, TFCC  Special tests: Neg axial load to scaphoid.  Positive Finkelstein's   Assessment & Plan:  1.  Left de Quervain's tenosynovitis Patient with recurrence of de Quervain's tenosynovitis.  Differential also includes intersection syndrome though the location of pain more consistent with de Quervain's.  We discussed options including NSAIDs, bracing, repeat steroid injection, PT.  She would like to avoid steroid injection if  possible.  Plan: -Thumb spica -Topical Voltaren 3 times daily -Follow-up in 6 weeks, sooner if concerns.  If recalcitrant would try corticosteroid injection and/or PT.  Dagoberto Ligas, MD [facility] 05/04/2020 4:41 PM

## 2020-05-09 ENCOUNTER — Other Ambulatory Visit: Payer: Self-pay

## 2020-05-09 ENCOUNTER — Ambulatory Visit: Payer: Self-pay | Admitting: Family Medicine

## 2020-05-09 DIAGNOSIS — Z0289 Encounter for other administrative examinations: Secondary | ICD-10-CM

## 2020-05-10 ENCOUNTER — Telehealth (INDEPENDENT_AMBULATORY_CARE_PROVIDER_SITE_OTHER): Payer: Self-pay | Admitting: Family Medicine

## 2020-05-10 ENCOUNTER — Encounter: Payer: Self-pay | Admitting: Family Medicine

## 2020-05-10 VITALS — Temp 97.7°F

## 2020-05-10 DIAGNOSIS — IMO0002 Reserved for concepts with insufficient information to code with codable children: Secondary | ICD-10-CM

## 2020-05-10 DIAGNOSIS — E1151 Type 2 diabetes mellitus with diabetic peripheral angiopathy without gangrene: Secondary | ICD-10-CM

## 2020-05-10 DIAGNOSIS — R42 Dizziness and giddiness: Secondary | ICD-10-CM

## 2020-05-10 DIAGNOSIS — E1165 Type 2 diabetes mellitus with hyperglycemia: Secondary | ICD-10-CM

## 2020-05-10 MED ORDER — OZEMPIC (0.25 OR 0.5 MG/DOSE) 2 MG/1.5ML ~~LOC~~ SOPN: 0.5000 mg | PEN_INJECTOR | SUBCUTANEOUS | 5 refills | Status: DC

## 2020-05-10 MED ORDER — LORATADINE 10 MG PO TABS: 10.0000 mg | ORAL_TABLET | Freq: Every day | ORAL | 11 refills | Status: DC

## 2020-05-10 MED ORDER — ONETOUCH DELICA LANCETS 30G MISC: 1.0000 | Freq: Two times a day (BID) | 5 refills | Status: DC | PRN

## 2020-05-10 MED ORDER — ONETOUCH VERIO VI STRP: ORAL_STRIP | 12 refills | Status: AC

## 2020-05-10 NOTE — Progress Notes (Signed)
Virtual Visit via Video Note  I connected with Felicia Solis on 05/10/20 at  4:20 PM EST by a video enabled telemedicine application and verified that I am speaking with the correct person using two identifiers.  Location: Patient: work alone  Provider: office    I discussed the limitations of evaluation and management by telemedicine and the availability of in person appointments. The patient expressed understanding and agreed to proceed.  History of Present Illness: Pt is at work c/o dizziness and trouble with concentration   She feels pressure in her ears and nausea    Observations/Objective: Vitals:   05/10/20 1619  Temp: 97.7 F (36.5 C)   Pt is in nad   Assessment and Plan: 1. Dizzy con't flonase and start claritin  Consider f/u Dr Tomi Likens  epley manuver  - loratadine (CLARITIN) 10 MG tablet; Take 1 tablet (10 mg total) by mouth daily.  Dispense: 30 tablet; Refill: 11  2. Uncontrolled type 2 diabetes mellitus with hyperglycemia (HCC) Pt not checking bs-- recenly but bs running high.  Refill lancets / strips Increase ozempic ---  - OneTouch Delica Lancets 88K MISC; 1 Device by Does not apply route 2 (two) times daily as needed.  Dispense: 60 each; Refill: 5 - Semaglutide,0.25 or 0.5MG /DOS, (OZEMPIC, 0.25 OR 0.5 MG/DOSE,) 2 MG/1.5ML SOPN; Inject 0.5 mg into the skin once a week.  Dispense: 1.5 mL; Refill: 5  3. DM (diabetes mellitus) type II uncontrolled, periph vascular disorder (HCC)   - glucose blood (ONETOUCH VERIO) test strip; Onetouch Verio Check blood sugar twice daily  Dispense: 100 each; Refill: 12   Follow Up Instructions:    I discussed the assessment and treatment plan with the patient. The patient was provided an opportunity to ask questions and all were answered. The patient agreed with the plan and demonstrated an understanding of the instructions.   The patient was advised to call back or seek an in-person evaluation if the symptoms worsen or if the  condition fails to improve as anticipated.  I provided 25 minutes of non-face-to-face time during this encounter.   [name], DO

## 2020-05-10 NOTE — Patient Instructions (Signed)

## 2020-05-17 ENCOUNTER — Ambulatory Visit (HOSPITAL_BASED_OUTPATIENT_CLINIC_OR_DEPARTMENT_OTHER)
Admission: RE | Admit: 2020-05-17 | Discharge: 2020-05-17 | Disposition: A | Discharge status: Home / Self Care | Payer: Self-pay | Source: Ambulatory Visit | Attending: Medical | Admitting: Medical

## 2020-05-17 ENCOUNTER — Ambulatory Visit (INDEPENDENT_AMBULATORY_CARE_PROVIDER_SITE_OTHER): Payer: Self-pay | Admitting: Medical

## 2020-05-17 ENCOUNTER — Other Ambulatory Visit: Payer: Self-pay

## 2020-05-17 VITALS — BP 118/75 | HR 80 | Temp 98.2°F | Resp 18 | Ht 66.0 in | Wt 227.0 lb

## 2020-05-17 DIAGNOSIS — E1165 Type 2 diabetes mellitus with hyperglycemia: Secondary | ICD-10-CM

## 2020-05-17 DIAGNOSIS — M79671 Pain in right foot: Secondary | ICD-10-CM

## 2020-05-17 LAB — URIC ACID: Uric Acid, Serum: 5.6 mg/dL (ref 2.4–7.0)

## 2020-05-17 NOTE — Progress Notes (Signed)
Subjective:    Patient ID: Felicia Solis, female    DOB: [birth year], 54 y.o.   MRN: [id number]  HPI  Pt in for rt ankle for one day. Just states woke up yesterday morning and felt the pain. Pain at rest but when applies pressure and walks pain is worse.  Pt is about to go out town.    On review saw your a1c was elevated at 8.1. Pt on ozempic and glimepride. Pt just got new strips.    Review of Systems  Constitutional: Negative for chills, fatigue and fever.  Respiratory: Negative for cough, chest tightness, shortness of breath and wheezing.   Cardiovascular: Negative for chest pain and palpitations.  Gastrointestinal: Negative for abdominal pain.  Musculoskeletal: Negative for back pain and neck pain.       Rt foot and ankle pain.  Skin: Negative for rash.  Hematological: Negative for adenopathy. Does not bruise/bleed easily.  Psychiatric/Behavioral: Negative for behavioral problems and confusion.    Past Medical History:  Diagnosis Date   Anemia    anemia  when pregnant in 2002   Anxiety    Arthritis    "knees and back" (09/28/2016)   Asthma 5 yrs ago    told she had asthma.. does not remember doctors name.    Chronic lower back pain    Depression    takes citalopram   History of hiatal hernia    History of kidney stones    Hyperlipidemia    Hypothyroidism    LBBB (left bundle branch block)    rate-related; identified on stress test 03/21/16   Migraine    "weekly" (09/28/2016)   Positive TB test 2008   pt states she took 9 months of medicine for positive tb skin test   Shortness of breath    with exertion   Thyroid disease    Type II diabetes mellitus ([place]) dx'd 2016     Social History   Socioeconomic History   Marital status: Married    Spouse name: Not on file   Number of children: 3   Years of education: Not on file   Highest education level: Not on file  Occupational History   Occupation: CNA  Tobacco Use   Smoking status: Never  Smoker   Smokeless tobacco: Never Used  Scientific laboratory technician Use: Never used  Substance and Sexual Activity   Alcohol use: No    Alcohol/week: 0.0 standard drinks   Drug use: No   Sexual activity: Not on file  Other Topics Concern   Not on file  Social History Narrative   No exercise--  Walking and lifting pts at work   Right handed   Lives in single story home   Social Determinants of Health   Financial Resource Strain:    Difficulty of Paying Living Expenses: Not on file  Food Insecurity:    Worried About Charity fundraiser in the Last Year: Not on file   [employer] of Food in the Last Year: Not on file  Transportation Needs:    Lack of Transportation (Medical): Not on file   Lack of Transportation (Non-Medical): Not on file  Physical Activity:    Days of Exercise per Week: Not on file   Minutes of Exercise per Session: Not on file  Stress:    Feeling of Stress : Not on file  Social Connections:    Frequency of Communication with Friends and Family: Not on file   Frequency  of Social Gatherings with Friends and Family: Not on file   Attends Religious Services: Not on file   Active Member of Clubs or Organizations: Not on file   Attends Banker Meetings: Not on file   Marital Status: Not on file  Intimate Partner Violence:    Fear of Current or Ex-Partner: Not on file   Emotionally Abused: Not on file   Physically Abused: Not on file   Sexually Abused: Not on file    Past Surgical History:  Procedure Laterality Date   DILATION AND CURETTAGE OF UTERUS  1999   ENDOMETRIAL ABLATION     EXTRACORPOREAL SHOCK WAVE LITHOTRIPSY Right 04/02/2019   Procedure: EXTRACORPOREAL SHOCK WAVE LITHOTRIPSY (ESWL);  Surgeon: Rene Paci, MD;  Location: [facility];  Service: Urology;  Laterality: Right;   LAPAROSCOPY     years ago ..pt does not know where surgery was done ... states" my stomach would blow up then go down"   THYROIDECTOMY   09/28/2011   Procedure: THYROIDECTOMY;  Surgeon: [name], MD;  Location: [facility] OR;  Service: ENT;  Laterality: Right;  RIGHT THYROIDECTOMY WITH FROZEN SECTION   THYROIDECTOMY  09/28/2016   completion of thyroidectomy/notes 09/28/2016   THYROIDECTOMY Left 09/28/2016   Procedure: COMPLETION OF THYROIDECTOMY;  Surgeon: [name], MD;  Location: [facility] OR;  Service: ENT;  Laterality: Left;   TUBAL LIGATION      Family History  Problem Relation Age of Onset   Diabetes Mother    Hypertension Mother    Hyperlipidemia Mother    Gallbladder disease Mother    Heart disease Mother    Diabetes Sister    Hypertension Father    Hyperlipidemia Father    Colon polyps Father    Ovarian cancer Maternal Grandmother    Gallbladder disease Maternal Grandmother    Breast cancer Maternal Grandmother 36   Colon cancer Paternal Grandfather    Breast cancer Maternal Aunt        late 33s   Heart disease Other        early cad   Diabetes Brother    Heart attack Brother    Anesthesia problems Neg Hx    Hypotension Neg Hx    Malignant hyperthermia Neg Hx    Pseudochol deficiency Neg Hx    Esophageal cancer Neg Hx     Allergies  Allergen Reactions   Lisinopril Cough    Current Outpatient Medications on File Prior to Visit  Medication Sig Dispense Refill   acetaminophen (TYLENOL) 500 MG tablet Take 1,000 mg by mouth every 6 (six) hours as needed for mild pain.     ALPRAZolam (XANAX) 0.5 MG tablet Take 1 tablet (0.5 mg total) by mouth 3 (three) times daily as needed for anxiety. 10 tablet 0   aspirin EC 81 MG tablet Take 1 tablet (81 mg total) by mouth daily. 90 tablet 3   blood glucose meter kit and supplies KIT Dispense based on patient and insurance preference. Use up to four times daily as directed. (FOR ICD-9 250.00, 250.01). 1 each 0   Cholecalciferol (VITAMIN D3) 2000 units TABS Take 2,000 Units by mouth daily.      citalopram (CELEXA) 20 MG tablet Take 1 tablet by  mouth once daily 90 tablet 1   cyanocobalamin 500 MCG tablet Take 1,000 mcg by mouth daily.      cyclobenzaprine (FLEXERIL) 10 MG tablet Take 1 tablet (10 mg total) by mouth 3 (three) times daily as needed for muscle  spasms. 30 tablet 0   diclofenac (VOLTAREN) 75 MG EC tablet Take 1 tablet (75 mg total) by mouth 2 (two) times daily. 60 tablet 1   EUTHYROX 88 MCG tablet Take 1 tablet by mouth once daily 90 tablet 2   Flaxseed, Linseed, (FLAXSEED OIL PO) Take 1,300 mg by mouth daily.     fluticasone (FLONASE) 50 MCG/ACT nasal spray Place 2 sprays into both nostrils daily. 16 g 6   Ginkgo Biloba Extract 60 MG CAPS Take 60 mg by mouth daily.     glimepiride (AMARYL) 2 MG tablet TAKE 1 TABLET BY MOUTH ONCE DAILY BEFORE BREAKFAST 90 tablet 1   glucose blood (ONETOUCH VERIO) test strip Onetouch Verio Check blood sugar twice daily 100 each 12   loratadine (CLARITIN) 10 MG tablet Take 1 tablet (10 mg total) by mouth daily. 30 tablet 11   losartan (COZAAR) 50 MG tablet Take 1 tablet by mouth once daily 90 tablet 1   Omega-3 Fatty Acids (FISH OIL) 1200 MG CAPS Take 1,200 mg by mouth daily.     OneTouch Delica Lancets 28M MISC 1 Device by Does not apply route 2 (two) times daily as needed. 60 each 5   pantoprazole (PROTONIX) 40 MG tablet Take 1 tablet (40 mg total) by mouth daily. 90 tablet 3   rizatriptan (MAXALT) 10 MG tablet Take 1 tablet earliest onset of headache.  May repeat in 2 hours if needed.  Maximum 2 tablets in 24 hours. 10 tablet 3   rosuvastatin (CRESTOR) 20 MG tablet Take 1 tablet by mouth once daily 90 tablet 1   Semaglutide,0.25 or 0.5MG /DOS, (OZEMPIC, 0.25 OR 0.5 MG/DOSE,) 2 MG/1.5ML SOPN Inject 0.5 mg into the skin once a week. 1.5 mL 5   zonisamide (ZONEGRAN) 25 MG capsule Take 1 capsule daily for 1 week, then 2 capsules daily for 1 week, then 3 capsules daily for 1 week, then 4 capsules daily. 120 capsule 0   No current facility-administered medications on file prior  to visit.    BP 118/75    Pulse 80    Temp 98.2 F (36.8 C) (Oral)    Resp 18    Ht 5\' 6"  (1.676 m)    Wt 227 lb (103 kg)    SpO2 96%    BMI 36.64 kg/m       Objective:   Physical Exam  General Mental Status- Alert. General Appearance- Not in acute distress.    Chest and Lung Exam Auscultation: Breath Sounds:-Normal.  Cardiovascular Auscultation:Rythm- Regular. Murmurs & Other Heart Sounds:Auscultation of the heart reveals- No Murmurs.  Neurologic Cranial Nerve exam:- CN III-XII intact(No nystagmus), symmetric smile. Strength:- 5/5 equal and symmetric strength both upper and lower extremities.  Rt ankle- faint mild tender talofibular area pain on palpation.  Rt foot very tender lateral foot pain near heel.    Assessment & Plan:  For rt foot/ankle area pain will get xray of foot. Note to radiology to include lateral ankle on films. Get uric acid today. Use ibuprofen low dose over the counter and can use combo of tylenol if needed.   For diabetes encourage diet and med compliance. Offered referral to diabetic education. Declined but if you change our mind let us know.  Htn history. Well controlled. Ibuprofen can increase bp so low dose 200 mg max recommended.   Follow up 10-14 days or as needed. Also might refer to sports med.

## 2020-05-17 NOTE — Patient Instructions (Addendum)
For rt foot/ankle area pain will get xray of foot. Note to radiology to include lateral ankle on films. Get uric acid today. Use ibuprofen low dose over the counter and can use combo of tylenol if needed.   For diabetes(a1c 8 range)  encourage diet and med compliance. Offered referral to diabetic education. Declined but if you change our mind let us know.  Htn history. Well controlled. Ibuprofen can increase bp so low dose 200 mg max recommended.   Follow up 10-14 days or as needed. Also might refer to sports med.

## 2020-05-26 ENCOUNTER — Other Ambulatory Visit: Payer: Self-pay

## 2020-05-26 DIAGNOSIS — Z20822 Contact with and (suspected) exposure to covid-19: Secondary | ICD-10-CM

## 2020-05-27 LAB — SARS-COV-2, NAA 2 DAY TAT

## 2020-05-27 LAB — NOVEL CORONAVIRUS, NAA: SARS-CoV-2, NAA: NOT DETECTED

## 2020-05-28 ENCOUNTER — Ambulatory Visit: Payer: Self-pay

## 2020-06-09 LAB — HM DIABETES EYE EXAM

## 2020-06-13 ENCOUNTER — Ambulatory Visit: Payer: Self-pay | Admitting: Family Medicine

## 2020-06-23 ENCOUNTER — Other Ambulatory Visit: Payer: Self-pay

## 2020-06-23 ENCOUNTER — Encounter: Payer: Self-pay | Admitting: Family

## 2020-06-23 ENCOUNTER — Telehealth (INDEPENDENT_AMBULATORY_CARE_PROVIDER_SITE_OTHER): Payer: Self-pay | Admitting: Family

## 2020-06-23 VITALS — Temp 97.4°F | Ht 66.0 in | Wt 227.0 lb

## 2020-06-23 DIAGNOSIS — R059 Cough, unspecified: Secondary | ICD-10-CM

## 2020-06-23 DIAGNOSIS — Z20822 Contact with and (suspected) exposure to covid-19: Secondary | ICD-10-CM

## 2020-06-23 DIAGNOSIS — J209 Acute bronchitis, unspecified: Secondary | ICD-10-CM

## 2020-06-23 MED ORDER — PROMETHAZINE-DM 6.25-15 MG/5ML PO SYRP: 5.0000 mL | ORAL_SOLUTION | Freq: Four times a day (QID) | ORAL | 0 refills | Status: DC | PRN

## 2020-06-23 MED ORDER — PREDNISONE 20 MG PO TABS: 20.0000 mg | ORAL_TABLET | Freq: Every day | ORAL | 0 refills | Status: DC

## 2020-06-23 NOTE — Progress Notes (Signed)
Virtual Visit via Video   I connected with patient on 06/23/20 at  1:20 PM EST by a video enabled telemedicine application and verified that I am speaking with the correct person using two identifiers.  Location patient: Home Location provider: [employer], Office Persons participating in the virtual visit: Patient, Provider, CMA   I discussed the limitations of evaluation and management by telemedicine and the availability of in person appointments. The patient expressed understanding and agreed to proceed.  Subjective:   HPI:  54 year old unvaccinated female is presents via video visit with concerns of cough, fever, congestion, body aches and chills x 2 days and worsening, She denies any known sick contact. Recently traveled to [place] and visited family for Christmas. She reports going to several houses in settings of 6 people or less    ROS:   See pertinent positives and negatives per HPI.  Patient Active Problem List   Diagnosis Date Noted  . Uncontrolled type 2 diabetes mellitus with hyperglycemia ([place]) 02/25/2020  . Trapezius muscle strain, left, initial encounter 08/17/2019  . Left wrist pain 08/11/2019  . Acute pain of left shoulder 08/11/2019  . Acute nonintractable headache 08/11/2019  . De Quervain's tenosynovitis, left 08/11/2019  . Vertigo 06/09/2019  . Right acute serous otitis media 06/09/2019  . Impacted cerumen of left ear 06/09/2019  . Pelvic pain 02/23/2019  . HNP (herniated nucleus pulposus), cervical 01/04/2019  . Epigastric mass 10/21/2018  . Myalgia 03/18/2018  . Hyperlipidemia associated with type 2 diabetes mellitus ([place]) 03/18/2018  . Nausea and vomiting 12/02/2017  . Hematuria 12/02/2017  . Pansinusitis 11/19/2017  . 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 Preventative health care 09/27/2017  . Hyperlipidemia LDL goal <100 09/18/2017  . Vitamin D deficiency 09/18/2017  . Hypothyroidism 09/18/2017  . Other intervertebral  disc degeneration, lumbar region 04/07/2017  . Left hip pain 04/07/2017  . Hyperlipidemia LDL goal <70 04/07/2017  . Essential hypertension 04/07/2017  . Acute vaginitis 04/07/2017  . S/P complete thyroidectomy 09/28/2016  . Memory loss 08/23/2016  . Thyroid nodule 08/02/2016  . Plantar fasciitis of right foot 12/01/2015  . Knee pain, right 12/01/2015  . Headache, migraine 09/06/2015  . Bilateral knee pain 08/22/2015  . Thoracic myofascial strain 08/22/2015  . Dysphagia 04/10/2015  . Atypical chest pain 04/10/2015  . Epigastric pain 04/10/2015  . Functional diarrhea 04/10/2015  . NSAID long-term use 04/10/2015  . Midepigastric pain 03/22/2015  . Cough 01/07/2015  . Allergic rhinitis 12/13/2014  . Acute bronchitis 12/13/2014  . Blood in stool 10/01/2014  . DM (diabetes mellitus) type II uncontrolled, periph vascular disorder ([place]) 08/13/2014  . Abnormal ECG 05/14/2012  . Breast pain 10/23/2011  . ASCUS on Pap smear 10/08/2011  . H/O thyroid nodule 08/27/2011  . Depression with anxiety 08/27/2011  . Heavy periods 08/27/2011    Social History   Tobacco Use  . Smoking status: Never Smoker  . Smokeless tobacco: Never Used  Substance Use Topics  . Alcohol use: No    Alcohol/week: 0.0 standard drinks    Current Outpatient Medications:  .  acetaminophen (TYLENOL) 500 MG tablet, Take 1,000 mg by mouth every 6 (six) hours as needed for mild pain., Disp: , Rfl:  .  ALPRAZolam (XANAX) 0.5 MG tablet, Take 1 tablet (0.5 mg total) by mouth 3 (three) times daily as needed for anxiety., Disp: 10 tablet, Rfl: 0 .  aspirin EC 81 MG tablet, Take 1 tablet (81 mg total) by mouth daily.,  Disp: 90 tablet, Rfl: 3 .  blood glucose meter kit and supplies KIT, Dispense based on patient and insurance preference. Use up to four times daily as directed. (FOR ICD-9 250.00, 250.01)., Disp: 1 each, Rfl: 0 .  Cholecalciferol (VITAMIN D3) 2000 units TABS, Take 2,000 Units by mouth daily. , Disp: , Rfl:  .   citalopram (CELEXA) 20 MG tablet, Take 1 tablet by mouth once daily, Disp: 90 tablet, Rfl: 1 .  cyclobenzaprine (FLEXERIL) 10 MG tablet, Take 1 tablet (10 mg total) by mouth 3 (three) times daily as needed for muscle spasms., Disp: 30 tablet, Rfl: 0 .  diclofenac (VOLTAREN) 75 MG EC tablet, Take 1 tablet (75 mg total) by mouth 2 (two) times daily., Disp: 60 tablet, Rfl: 1 .  EUTHYROX 88 MCG tablet, Take 1 tablet by mouth once daily, Disp: 90 tablet, Rfl: 2 .  Flaxseed, Linseed, (FLAXSEED OIL PO), Take 1,300 mg by mouth daily., Disp: , Rfl:  .  fluticasone (FLONASE) 50 MCG/ACT nasal spray, Place 2 sprays into both nostrils daily., Disp: 16 g, Rfl: 6 .  glimepiride (AMARYL) 2 MG tablet, TAKE 1 TABLET BY MOUTH ONCE DAILY BEFORE BREAKFAST, Disp: 90 tablet, Rfl: 1 .  glucose blood (ONETOUCH VERIO) test strip, Onetouch Verio Check blood sugar twice daily, Disp: 100 each, Rfl: 12 .  loratadine (CLARITIN) 10 MG tablet, Take 1 tablet (10 mg total) by mouth daily., Disp: 30 tablet, Rfl: 11 .  losartan (COZAAR) 50 MG tablet, Take 1 tablet by mouth once daily, Disp: 90 tablet, Rfl: 1 .  Omega-3 Fatty Acids (FISH OIL) 1200 MG CAPS, Take 1,200 mg by mouth daily., Disp: , Rfl:  .  OneTouch Delica Lancets 40J MISC, 1 Device by Does not apply route 2 (two) times daily as needed., Disp: 60 each, Rfl: 5 .  predniSONE (DELTASONE) 20 MG tablet, Take 1 tablet (20 mg total) by mouth daily with breakfast., Disp: 5 tablet, Rfl: 0 .  promethazine-dextromethorphan (PROMETHAZINE-DM) 6.25-15 MG/5ML syrup, Take 5 mLs by mouth 4 (four) times daily as needed for cough., Disp: 118 mL, Rfl: 0 .  rosuvastatin (CRESTOR) 20 MG tablet, Take 1 tablet by mouth once daily, Disp: 90 tablet, Rfl: 1 .  Semaglutide,0.25 or 0.5MG /DOS, (OZEMPIC, 0.25 OR 0.5 MG/DOSE,) 2 MG/1.5ML SOPN, Inject 0.5 mg into the skin once a week., Disp: 1.5 mL, Rfl: 5 .  zonisamide (ZONEGRAN) 25 MG capsule, Take 1 capsule daily for 1 week, then 2 capsules daily for 1  week, then 3 capsules daily for 1 week, then 4 capsules daily., Disp: 120 capsule, Rfl: 0 .  cyanocobalamin 500 MCG tablet, Take 1,000 mcg by mouth daily. , Disp: , Rfl:  .  Ginkgo Biloba Extract 60 MG CAPS, Take 60 mg by mouth daily. (Patient not taking: Reported on 06/23/2020), Disp: , Rfl:  .  pantoprazole (PROTONIX) 40 MG tablet, Take 1 tablet (40 mg total) by mouth daily. (Patient not taking: Reported on 06/23/2020), Disp: 90 tablet, Rfl: 3 .  rizatriptan (MAXALT) 10 MG tablet, Take 1 tablet earliest onset of headache.  May repeat in 2 hours if needed.  Maximum 2 tablets in 24 hours., Disp: 10 tablet, Rfl: 3  Allergies  Allergen Reactions  . Lisinopril Cough    Objective:   Temp (!) 97.4 F (36.3 C) (Temporal)   Ht 5\' 6"  (1.676 m)   Wt 227 lb (103 kg)   BMI 36.64 kg/m   Patient is well-developed, well-nourished in no acute distress.  Resting comfortably  at home.  Head is normocephalic, atraumatic.  No labored breathing.  Speech is clear and coherent with logical content.  Patient is alert and oriented at baseline.    Assessment and Plan:   Felicia Solis was seen today for cough.  Diagnoses and all orders for this visit:  Cough  Acute bronchitis, unspecified organism  Other orders -     predniSONE (DELTASONE) 20 MG tablet; Take 1 tablet (20 mg total) by mouth daily with breakfast. -     promethazine-dextromethorphan (PROMETHAZINE-DM) 6.25-15 MG/5ML syrup; Take 5 mLs by mouth 4 (four) times daily as needed for cough.  Advised to test for COVID-19. Rest, drink plenty of fluids. Call if symptoms worsen or persist   Kennyth Arnold, [place] 06/23/2020

## 2020-06-25 LAB — SARS-COV-2, NAA 2 DAY TAT

## 2020-06-25 LAB — NOVEL CORONAVIRUS, NAA: SARS-CoV-2, NAA: DETECTED — AB

## 2020-06-26 ENCOUNTER — Telehealth: Payer: Self-pay | Admitting: Physician Assistant

## 2020-06-26 NOTE — Telephone Encounter (Signed)
Called to discuss with Felicia Solis about Covid symptoms and the use of  monoclonal antibody infusion for those with mild to moderate Covid symptoms and at a high risk of hospitalization.     Pt is qualified for this infusion due to co-morbid conditions and/or a member of an at-risk group, however declines infusion at this time.  Symptoms reviewed as well as criteria for ending isolation.  Symptoms reviewed that would warrant ED/Hospital evaluation. Preventative practices reviewed. Patient verbalized understanding. Patient advised to call back if he decides that he does want to get infusion. Callback number to the infusion center given. Patient advised to go to Urgent care or ED with severe symptoms.  Last date she would be eligible for Sortovimab  infusion is 06/27/20.  Risk factors: Unvaccinated, DM, HTN and BMI >25   Patient Active Problem List   Diagnosis Date Noted  . Uncontrolled type 2 diabetes mellitus with hyperglycemia ([facility]) 02/25/2020  . Trapezius muscle strain, left, initial encounter 08/17/2019  . Left wrist pain 08/11/2019  . Acute pain of left shoulder 08/11/2019  . Acute nonintractable headache 08/11/2019  . De Quervain's tenosynovitis, left 08/11/2019  . Vertigo 06/09/2019  . Right acute serous otitis media 06/09/2019  . Impacted cerumen of left ear 06/09/2019  . Pelvic pain 02/23/2019  . HNP (herniated nucleus pulposus), cervical 01/04/2019  . Epigastric mass 10/21/2018  . Myalgia 03/18/2018  . Hyperlipidemia associated with type 2 diabetes mellitus ([facility]) 03/18/2018  . Nausea and vomiting 12/02/2017  . Hematuria 12/02/2017  . Pansinusitis 11/19/2017  . 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 Preventative health care 09/27/2017  . Hyperlipidemia LDL goal <100 09/18/2017  . Vitamin D deficiency 09/18/2017  . Hypothyroidism 09/18/2017  . Other intervertebral disc degeneration, lumbar region 04/07/2017  . Left hip pain 04/07/2017  . Hyperlipidemia LDL goal <70  04/07/2017  . Essential hypertension 04/07/2017  . Acute vaginitis 04/07/2017  . S/P complete thyroidectomy 09/28/2016  . Memory loss 08/23/2016  . Thyroid nodule 08/02/2016  . Plantar fasciitis of right foot 12/01/2015  . Knee pain, right 12/01/2015  . Headache, migraine 09/06/2015  . Bilateral knee pain 08/22/2015  . Thoracic myofascial strain 08/22/2015  . Dysphagia 04/10/2015  . Atypical chest pain 04/10/2015  . Epigastric pain 04/10/2015  . Functional diarrhea 04/10/2015  . NSAID long-term use 04/10/2015  . Midepigastric pain 03/22/2015  . Cough 01/07/2015  . Allergic rhinitis 12/13/2014  . Acute bronchitis 12/13/2014  . Blood in stool 10/01/2014  . DM (diabetes mellitus) type II uncontrolled, periph vascular disorder ([facility]) 08/13/2014  . Abnormal ECG 05/14/2012  . Breast pain 10/23/2011  . ASCUS on Pap smear 10/08/2011  . H/O thyroid nodule 08/27/2011  . Depression with anxiety 08/27/2011  . Heavy periods 08/27/2011     Manson Passey, PA-C

## 2020-06-27 ENCOUNTER — Encounter: Payer: Self-pay | Admitting: Family Medicine

## 2020-06-28 ENCOUNTER — Telehealth: Payer: Self-pay | Admitting: Family Medicine

## 2020-06-28 NOTE — Telephone Encounter (Signed)
Patient states she has a bad cough and body aches other than that she is doing ok. Patient is non vacinnated

## 2020-06-29 ENCOUNTER — Other Ambulatory Visit: Payer: Self-pay

## 2020-06-29 DIAGNOSIS — Z20822 Contact with and (suspected) exposure to covid-19: Secondary | ICD-10-CM

## 2020-06-29 NOTE — Telephone Encounter (Signed)
Do you remember - was this just an FYI for [place]?

## 2020-06-30 ENCOUNTER — Telehealth: Payer: Self-pay | Admitting: Family Medicine

## 2020-06-30 LAB — NOVEL CORONAVIRUS, NAA: SARS-CoV-2, NAA: NOT DETECTED

## 2020-06-30 LAB — SARS-COV-2, NAA 2 DAY TAT

## 2020-06-30 NOTE — Telephone Encounter (Signed)
Please look at Dr. Laury Axon note on Covid test results.

## 2020-06-30 NOTE — Telephone Encounter (Signed)
Again is this just an FYI? I'm confused as to what the patient is wanting?

## 2020-06-30 NOTE — Telephone Encounter (Signed)
Error

## 2020-06-30 NOTE — Telephone Encounter (Signed)
FYI. You asked how Pt was doing after virtual visit- she is vaccinated.

## 2020-06-30 NOTE — Telephone Encounter (Signed)
Patient is returning a call to [facility] in reference to covid test and how she is feeling. Patient is having bodyaches, bad cough.

## 2020-07-01 ENCOUNTER — Telehealth (INDEPENDENT_AMBULATORY_CARE_PROVIDER_SITE_OTHER): Payer: HRSA Program | Admitting: Family Medicine

## 2020-07-01 ENCOUNTER — Other Ambulatory Visit: Payer: Self-pay

## 2020-07-01 ENCOUNTER — Encounter: Payer: Self-pay | Admitting: Family Medicine

## 2020-07-01 VITALS — BP 108/75 | HR 73 | Temp 97.2°F | Ht 66.0 in | Wt 227.0 lb

## 2020-07-01 DIAGNOSIS — R519 Headache, unspecified: Secondary | ICD-10-CM

## 2020-07-01 DIAGNOSIS — U071 COVID-19: Secondary | ICD-10-CM | POA: Diagnosis not present

## 2020-07-01 DIAGNOSIS — S0340XA Sprain of jaw, unspecified side, initial encounter: Secondary | ICD-10-CM | POA: Diagnosis not present

## 2020-07-01 MED ORDER — METHOCARBAMOL 500 MG PO TABS: 500.0000 mg | ORAL_TABLET | Freq: Four times a day (QID) | ORAL | 1 refills | Status: DC

## 2020-07-01 NOTE — Progress Notes (Signed)
Virtual Visit via Video Note  I connected with Felicia Solis on 07/03/20 at  9:20 AM EST by a video enabled telemedicine application and verified that I am speaking with the correct person using two identifiers.  Location: Patient: home with her husband  Provider: office    I discussed the limitations of evaluation and management by telemedicine and the availability of in person appointments. The patient expressed understanding and agreed to proceed.  History of Present Illness: Pt is home tested positive a week ago sat for covid --- yesterday her test was neg.  She is still coughing  No fevers   She c/o jaw clicking and causing pain -- she has an app with the dentist next week Observations/Objective: Vitals:   07/01/20 0913  BP: 108/75  Pulse: 73  Temp: (!) 97.2 F (36.2 C)   Pt is in nad  Assessment and Plan: 1. Acute nonintractable headache, unspecified headache type ? From TMJ 2. TMJ (sprain of temporomandibular joint), initial encounter May be cause of headache Muscle relaxer Pt has app with dentist next week-- she will discuss  - methocarbamol (ROBAXIN) 500 MG tablet; Take 1 tablet (500 mg total) by mouth 4 (four) times daily.  Dispense: 45 tablet; Refill: 1  3. COVID-19 Still in quarantine  Symptoms improving   Follow Up Instructions:    I discussed the assessment and treatment plan with the patient. The patient was provided an opportunity to ask questions and all were answered. The patient agreed with the plan and demonstrated an understanding of the instructions.   The patient was advised to call back or seek an in-person evaluation if the symptoms worsen or if the condition fails to improve as anticipated.  I provided 25 minutes of non-face-to-face time during this encounter.   [name], DO

## 2020-07-01 NOTE — Patient Instructions (Signed)

## 2020-07-03 DIAGNOSIS — U071 COVID-19: Secondary | ICD-10-CM | POA: Insufficient documentation

## 2020-07-03 DIAGNOSIS — S0340XA Sprain of jaw, unspecified side, initial encounter: Secondary | ICD-10-CM | POA: Insufficient documentation

## 2020-07-06 ENCOUNTER — Other Ambulatory Visit: Payer: Self-pay

## 2020-07-06 DIAGNOSIS — Z20822 Contact with and (suspected) exposure to covid-19: Secondary | ICD-10-CM

## 2020-07-07 IMAGING — CT CT ANGIO CHEST
2 of 9 series · 18 of 36 positions shown · IV contrast (omnipaque)
Comparison: 10/01/2016

CLINICAL DATA: Positive D-dimer.  Chest pain, shortness of breath.

EXAM:
CT ANGIOGRAPHY CHEST WITH CONTRAST
TECHNIQUE: Multidetector CT imaging of the chest was performed using the
standard protocol during bolus administration of intravenous
contrast. Multiplanar CT image reconstructions and MIPs were
obtained to evaluate the vascular anatomy.
CONTRAST:  100mL OMNIPAQUE IOHEXOL 350 MG/ML SOLN

[Series 7: pe coronal mpr · coronal · 0.51mm/px · 1 of 119 slices shown]
[im 60/119  mediastinal]
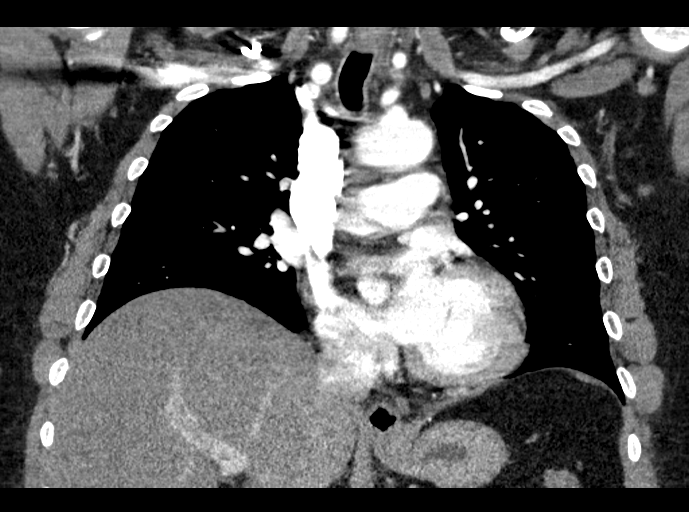

[Series 11: pe thins · axial · 0.73mm/px · z∈[-234,-26]mm · 17 of 234 slices shown]
[im 13/234  lung]
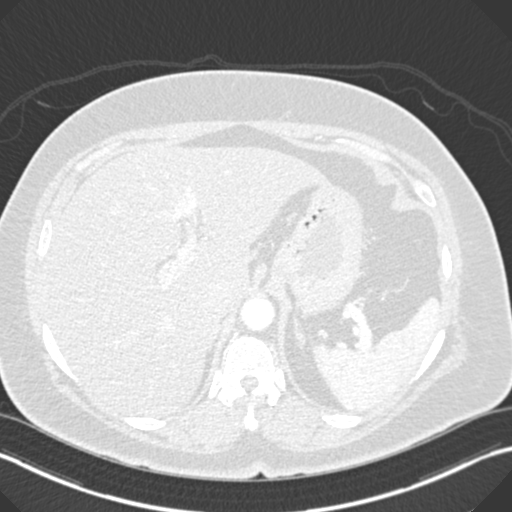
[im 26/234  mediastinal]
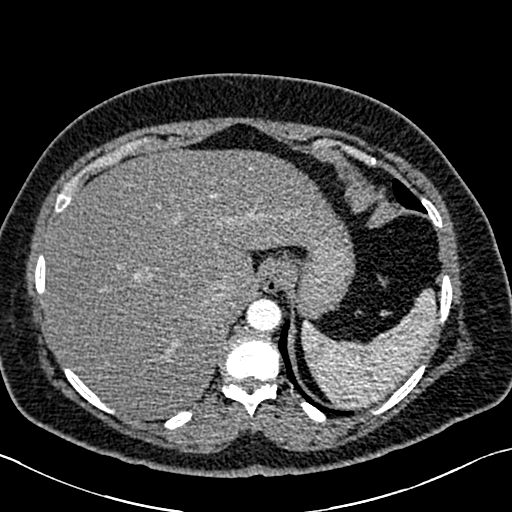
[im 39/234  lung]
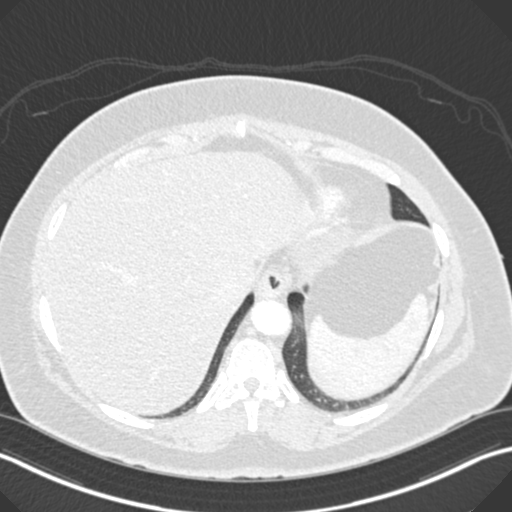
[im 52/234  mediastinal]
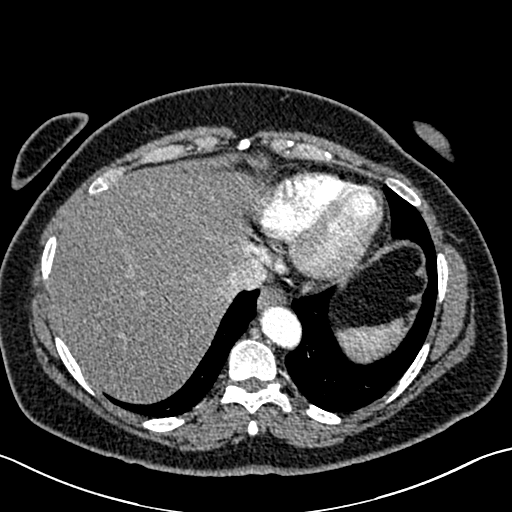
[im 65/234  lung]
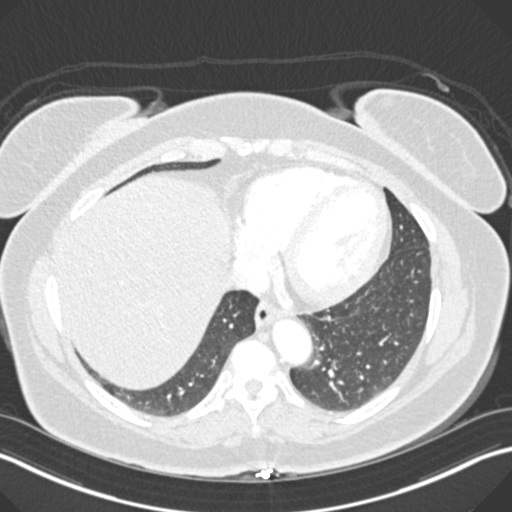
[im 78/234  mediastinal]
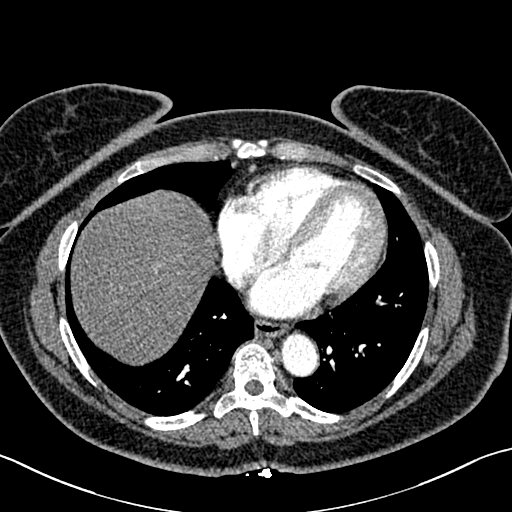
[im 91/234  lung]
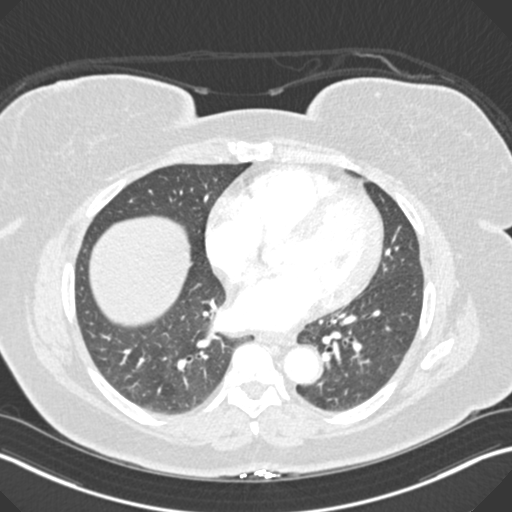
[im 104/234  mediastinal]
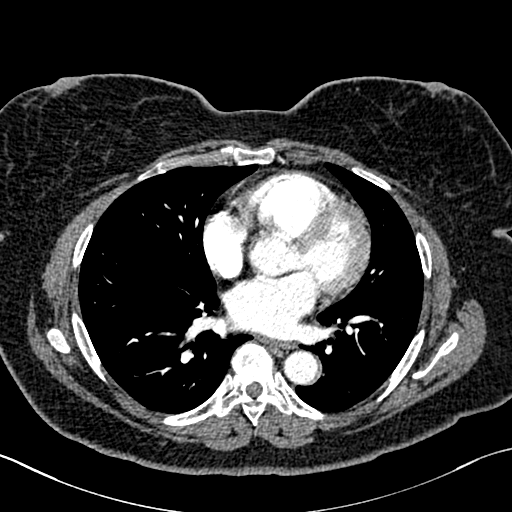
[im 117/234  lung]
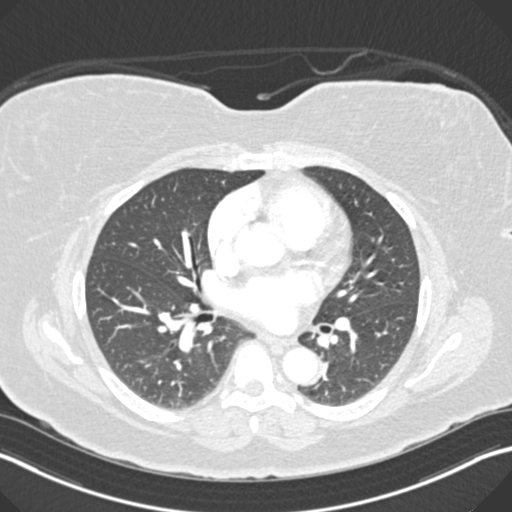
[im 130/234  mediastinal]
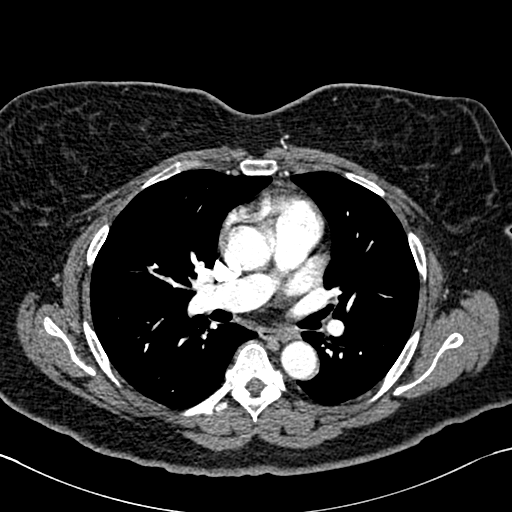
[im 143/234  lung]
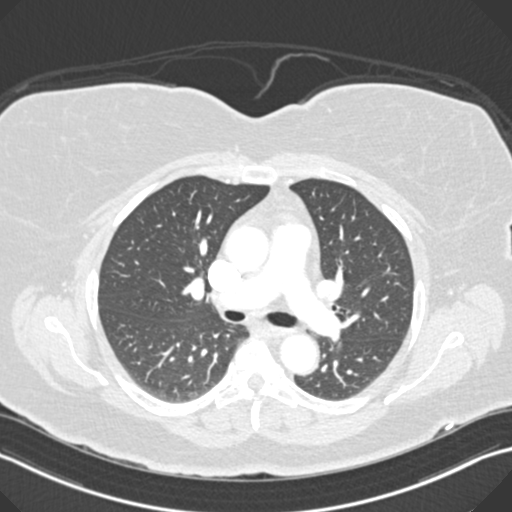
[im 156/234  mediastinal]
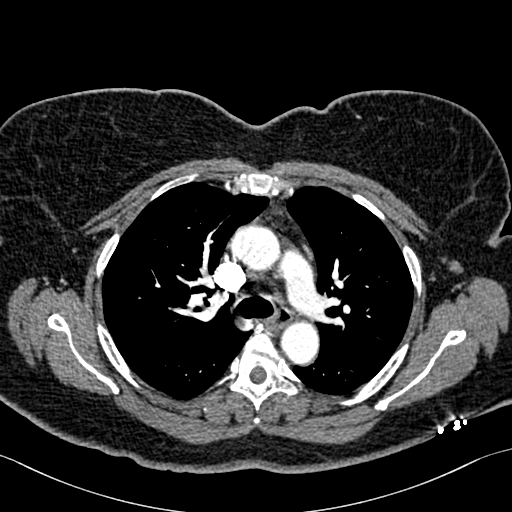
[im 169/234  lung]
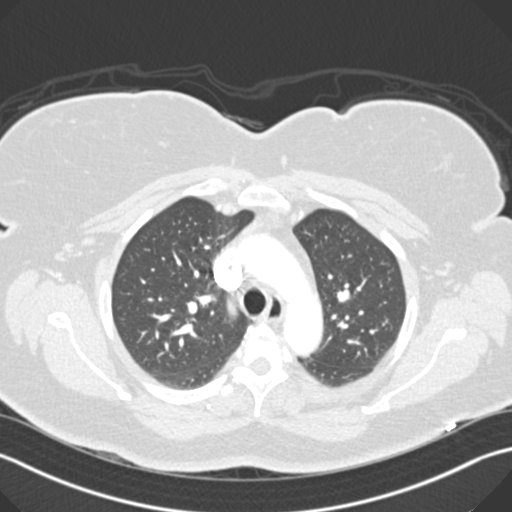
[im 182/234  mediastinal]
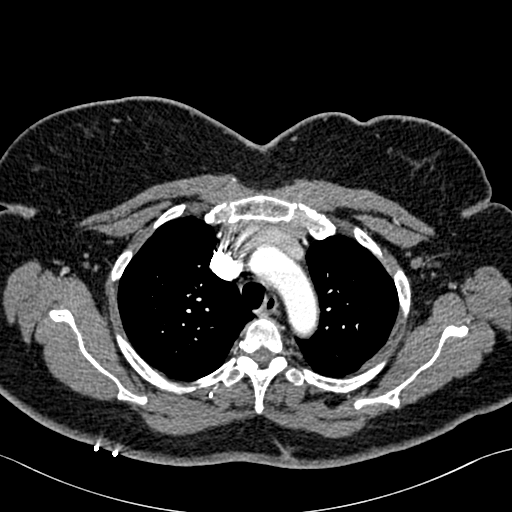
[im 195/234  lung]
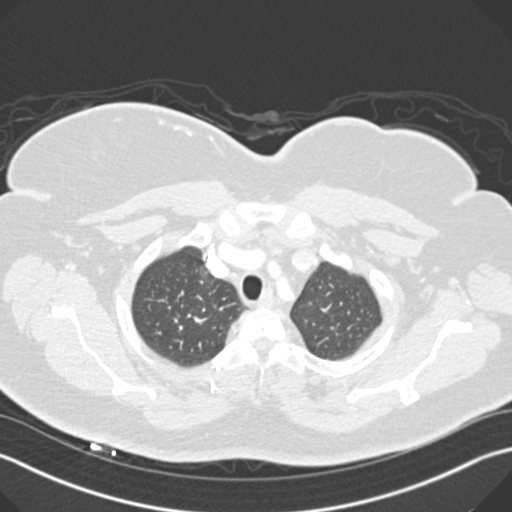
[im 208/234  mediastinal]
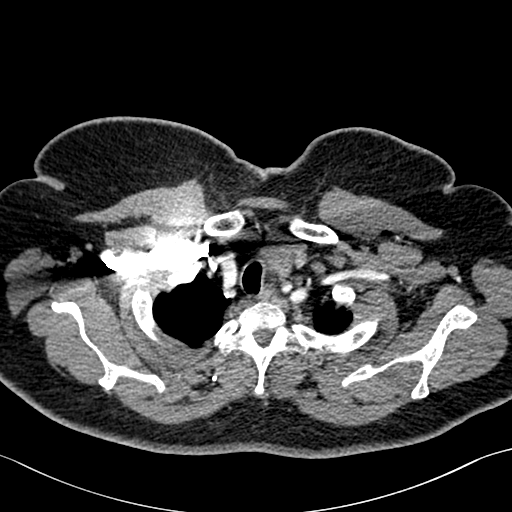
[im 221/234  lung]
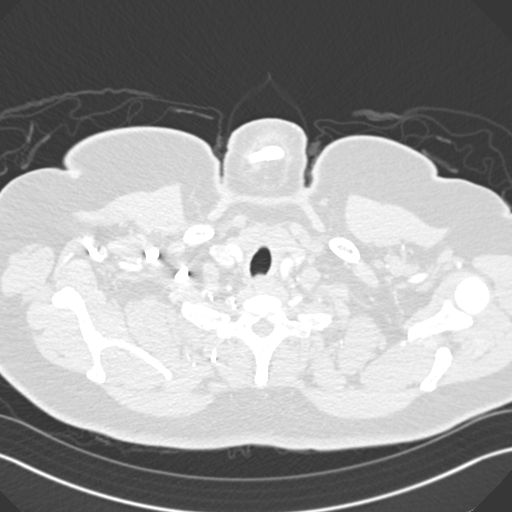

[18 of 36 positions shown; findings below may reference images not displayed]

FINDINGS: Cardiovascular: No filling defects in the pulmonary arteries to
suggest pulmonary emboli. Heart is normal size. Aorta is normal
caliber.

Mediastinum/Nodes: No mediastinal, hilar, or axillary adenopathy.
Trachea and esophagus are unremarkable.

Lungs/Pleura: Visualized lungs clear.  No effusions.

Upper Abdomen: Diffuse fatty infiltration of the liver. No acute
findings.

Musculoskeletal: Chest wall soft tissues are unremarkable. No acute
bony abnormality.

Review of the MIP images confirms the above findings.
IMPRESSION: No evidence of pulmonary embolus.

No acute cardiopulmonary disease.

Diffuse fatty infiltration of the liver.

## 2020-07-09 LAB — NOVEL CORONAVIRUS, NAA: SARS-CoV-2, NAA: NOT DETECTED

## 2020-07-21 ENCOUNTER — Other Ambulatory Visit: Payer: Self-pay | Admitting: Family Medicine

## 2020-08-01 ENCOUNTER — Telehealth: Payer: Self-pay | Admitting: Family Medicine

## 2020-08-01 NOTE — Telephone Encounter (Signed)
Do we have coupon for ozempic ?  She may be able to get a coupon from [email or web address]  and also check [email or web address]

## 2020-08-01 NOTE — Telephone Encounter (Signed)
Caller Felicia Solis  Call Back @ [phone]  Patient is calling in reference to medication OZEMPIC, patient is not able to afford this medication due to lack of insurance. Patient is requesting that Dr Etter Sjogren please prescribe something cheaper or provide  financial assistance if available.  Patient also States she was on two other medications prior to trying [facility] and if they are cheaper will go back to one of them.   Please Advise

## 2020-08-02 NOTE — Telephone Encounter (Signed)
Patient does not have any insurance.  1 sample given and application for assistance printed.  Provider has signed and patient will be by after 3pm to fill out her portion and to pick up sample (in adult fridge).

## 2020-08-04 NOTE — Telephone Encounter (Signed)
Patient assistance form faxed on 08/02/20.

## 2020-08-08 ENCOUNTER — Other Ambulatory Visit: Payer: Self-pay | Admitting: Family Medicine

## 2020-08-17 NOTE — Telephone Encounter (Signed)
Patient is believe to be depressed per Soni, Pt's PHQ score was 14

## 2020-08-19 ENCOUNTER — Other Ambulatory Visit: Payer: Self-pay

## 2020-08-19 ENCOUNTER — Ambulatory Visit (INDEPENDENT_AMBULATORY_CARE_PROVIDER_SITE_OTHER): Payer: Self-pay | Admitting: Family Medicine

## 2020-08-19 VITALS — BP 122/70 | HR 73 | Temp 98.8°F | Resp 18 | Ht 66.0 in | Wt 223.2 lb

## 2020-08-19 DIAGNOSIS — Z1159 Encounter for screening for other viral diseases: Secondary | ICD-10-CM

## 2020-08-19 DIAGNOSIS — R519 Headache, unspecified: Secondary | ICD-10-CM

## 2020-08-19 DIAGNOSIS — K219 Gastro-esophageal reflux disease without esophagitis: Secondary | ICD-10-CM

## 2020-08-19 DIAGNOSIS — G4489 Other headache syndrome: Secondary | ICD-10-CM

## 2020-08-19 DIAGNOSIS — E1165 Type 2 diabetes mellitus with hyperglycemia: Secondary | ICD-10-CM

## 2020-08-19 DIAGNOSIS — E785 Hyperlipidemia, unspecified: Secondary | ICD-10-CM

## 2020-08-19 DIAGNOSIS — G43809 Other migraine, not intractable, without status migrainosus: Secondary | ICD-10-CM

## 2020-08-19 DIAGNOSIS — E559 Vitamin D deficiency, unspecified: Secondary | ICD-10-CM

## 2020-08-19 DIAGNOSIS — Z0000 Encounter for general adult medical examination without abnormal findings: Secondary | ICD-10-CM

## 2020-08-19 DIAGNOSIS — I10 Essential (primary) hypertension: Secondary | ICD-10-CM

## 2020-08-19 DIAGNOSIS — R42 Dizziness and giddiness: Secondary | ICD-10-CM

## 2020-08-19 LAB — POC URINALSYSI DIPSTICK (AUTOMATED)
Bilirubin, UA: NEGATIVE
Blood, UA: NEGATIVE
Glucose, UA: NEGATIVE
Leukocytes, UA: NEGATIVE
Nitrite, UA: NEGATIVE
Protein, UA: POSITIVE — AB
Spec Grav, UA: >=1.03 — AB (ref 1.010–1.025)
Urobilinogen, UA: 0.2 E.U./dL
pH, UA: 5 (ref 5.0–8.0)

## 2020-08-19 MED ORDER — HYDROCHLOROTHIAZIDE 25 MG PO TABS: 25.0000 mg | ORAL_TABLET | Freq: Every day | ORAL | 3 refills | Status: DC

## 2020-08-19 MED ORDER — PANTOPRAZOLE SODIUM 40 MG PO TBEC: 40.0000 mg | DELAYED_RELEASE_TABLET | Freq: Every day | ORAL | 3 refills | Status: DC

## 2020-08-19 MED ORDER — ROSUVASTATIN CALCIUM 20 MG PO TABS: 20.0000 mg | ORAL_TABLET | Freq: Every day | ORAL | 1 refills | Status: DC

## 2020-08-19 MED ORDER — FLUTICASONE PROPIONATE 50 MCG/ACT NA SUSP: 2.0000 | Freq: Every day | NASAL | 6 refills | Status: DC

## 2020-08-19 NOTE — Progress Notes (Signed)
Subjective:     Felicia Solis is a 55 y.o. female and is here for a comprehensive physical exam. The patient reports problems - still c/o dizziness .  She never had the mri due to claustrophobia but is willing now=== with meds  She has a f/u with neuro and has seen ent as well.     Social History   Socioeconomic History  . Marital status: Married    Spouse name: Not on file  . Number of children: 3  . Years of education: Not on file  . Highest education level: Not on file  Occupational History  . Occupation: CNA  Tobacco Use  . Smoking status: Never Smoker  . Smokeless tobacco: Never Used  Vaping Use  . Vaping Use: Never used  Substance and Sexual Activity  . Alcohol use: No    Alcohol/week: 0.0 standard drinks  . Drug use: No  . Sexual activity: Not on file  Other Topics Concern  . Not on file  Social History Narrative   No exercise--  Walking and lifting pts at work   Right handed   Lives in single story home   Social Determinants of Health   Financial Resource Strain: Not on file  Food Insecurity: Not on file  Transportation Needs: Not on file  Physical Activity: Not on file  Stress: Not on file  Social Connections: Not on file  Intimate Partner Violence: Not on file   Health Maintenance  Topic Date Due  . Hepatitis C Screening  Never done  . PAP SMEAR-Modifier  09/11/2020  . COVID-19 Vaccine (1) 09/04/2020 (Originally 10/11/1970)  . INFLUENZA VACCINE  09/22/2020 (Originally 01/24/2020)  . PNEUMOCOCCAL POLYSACCHARIDE VACCINE AGE 63-64 HIGH RISK  08/19/2021 (Originally 10[birth year]/1966)  . MAMMOGRAM  10/08/2020  . OPHTHALMOLOGY EXAM  10/25/2020  . HEMOGLOBIN A1C  02/16/2021  . FOOT EXAM  08/19/2021  . COLONOSCOPY (Pts 45-73yr Insurance coverage will need to be confirmed)  12/02/2024  . TETANUS/TDAP  09/28/2027  . HIV Screening  Completed    The following portions of the patient's history were reviewed and updated as appropriate:  She  has a past medical history of  Anemia, Anxiety, Arthritis, Asthma (5 yrs ago ), Chronic lower back pain, Depression, History of hiatal hernia, History of kidney stones, Hyperlipidemia, Hypothyroidism, LBBB (left bundle branch block), Migraine, Positive TB test (2008), Shortness of breath, Thyroid disease, and Type II diabetes mellitus ([place] (dx'd [place]). She does not have any pertinent problems on file. She  has a past surgical history that includes Endometrial ablation; Tubal ligation; laparoscopy; Dilation and curettage of uterus (1999); Thyroidectomy (09/28/2011); Thyroidectomy (09/28/2016); Thyroidectomy (Left, 09/28/2016); and Extracorporeal shock wave lithotripsy (Right, 04/02/2019). Her family history includes Breast cancer in her maternal aunt; Breast cancer (age of onset: 552 in her maternal grandmother; Colon cancer in her paternal grandfather; Colon polyps in her father; Diabetes in her brother, mother, and sister; Gallbladder disease in her maternal grandmother and mother; Heart attack in her brother; Heart disease in her mother and another family member; Hyperlipidemia in her father and mother; Hypertension in her father and mother; Ovarian cancer in her maternal grandmother. She  reports that she has never smoked. She has never used smokeless tobacco. She reports that she does not drink alcohol and does not use drugs. She has a current medication list which includes the following prescription(s): acetaminophen, alprazolam, aspirin ec, blood glucose meter kit and supplies, vitamin d3, citalopram, vitamin b-12, flaxseed (linseed), glimepiride, onetouch verio, hydrochlorothiazide, levothyroxine,  losartan, methocarbamol, fish oil, onetouch delica lancets 32G, ozempic (0.25 or 0.5 mg/dose), fluticasone, pantoprazole, and rosuvastatin. Current Outpatient Medications on File Prior to Visit  Medication Sig Dispense Refill  . acetaminophen (TYLENOL) 500 MG tablet Take 1,000 mg by mouth every 6 (six) hours as needed for mild pain.    Marland Kitchen  ALPRAZolam (XANAX) 0.5 MG tablet Take 1 tablet (0.5 mg total) by mouth 3 (three) times daily as needed for anxiety. 10 tablet 0  . aspirin EC 81 MG tablet Take 1 tablet (81 mg total) by mouth daily. 90 tablet 3  . blood glucose meter kit and supplies KIT Dispense based on patient and insurance preference. Use up to four times daily as directed. (FOR ICD-9 250.00, 250.01). 1 each 0  . Cholecalciferol (VITAMIN D3) 2000 units TABS Take 2,000 Units by mouth daily.     . citalopram (CELEXA) 20 MG tablet Take 1 tablet by mouth once daily 90 tablet 1  . cyanocobalamin 500 MCG tablet Take 1,000 mcg by mouth daily.     . Flaxseed, Linseed, (FLAXSEED OIL PO) Take 1,300 mg by mouth daily.    Marland Kitchen glimepiride (AMARYL) 2 MG tablet TAKE 1 TABLET BY MOUTH ONCE DAILY BEFORE BREAKFAST 90 tablet 0  . glucose blood (ONETOUCH VERIO) test strip Onetouch Verio Check blood sugar twice daily 100 each 12  . levothyroxine (EUTHYROX) 88 MCG tablet Take 1 tablet (88 mcg total) by mouth daily before breakfast. 90 tablet 0  . losartan (COZAAR) 50 MG tablet Take 1 tablet by mouth once daily 90 tablet 1  . methocarbamol (ROBAXIN) 500 MG tablet Take 1 tablet (500 mg total) by mouth 4 (four) times daily. 45 tablet 1  . Omega-3 Fatty Acids (FISH OIL) 1200 MG CAPS Take 1,200 mg by mouth daily.    Glory Rosebush Delica Lancets 40N MISC 1 Device by Does not apply route 2 (two) times daily as needed. 60 each 5  . Semaglutide,0.25 or 0.5MG/DOS, (OZEMPIC, 0.25 OR 0.5 MG/DOSE,) 2 MG/1.5ML SOPN Inject 0.5 mg into the skin once a week. 1.5 mL 5   No current facility-administered medications on file prior to visit.   She is allergic to lisinopril..  Review of Systems Review of Systems  Constitutional: Negative for activity change, appetite change and fatigue.  HENT: Negative for hearing loss, congestion, tinnitus and ear discharge.  dentist [id number]: Negative for visual disturbance (see optho q1y -- vision corrected to 20/20 with glasses).   Respiratory: Negative for cough, chest tightness and shortness of breath.   Cardiovascular: Negative for chest pain, palpitations and leg swelling.  Gastrointestinal: Negative for abdominal pain, diarrhea, constipation and abdominal distention.  Genitourinary: Negative for urgency, frequency, decreased urine volume and difficulty urinating.  Musculoskeletal: Negative for back pain, arthralgias and gait problem.  Skin: Negative for color change, pallor and rash.  Neurological: + dizziness / headaches  Hematological: Negative for adenopathy. Does not bruise/bleed easily.  Psychiatric/Behavioral: Negative for suicidal ideas, confusion, sleep disturbance, self-injury, dysphoric mood, decreased concentration and agitation.       Objective:    BP 122/70 (BP Location: Left Arm, Patient Position: Sitting, Cuff Size: Large)   Pulse 73   Temp 98.8 F (37.1 C) (Oral)   Resp 18   Ht _0  (1.676 m)   Wt 223 lb 3.2 oz (101.2 kg)   SpO2 96%   BMI 36.03 kg/m  General appearance: alert, cooperative, appears stated age and no distress Head: Normocephalic, without obvious abnormality, atraumatic Eyes: negative findings:  lids and lashes normal, conjunctivae and sclerae normal and pupils equal, round, reactive to light and accomodation Ears: normal TM's and external ear canals both ears Neck: no adenopathy, no carotid bruit, no JVD, supple, symmetrical, trachea midline and thyroid not enlarged, symmetric, no tenderness/mass/nodules Back: symmetric, no curvature. ROM normal. No CVA tenderness. Lungs: clear to auscultation bilaterally Breasts: gyn Heart: regular rate and rhythm, S1, S2 normal, no murmur, click, rub or gallop Abdomen: soft, non-tender; bowel sounds normal; no masses,  no organomegaly Pelvic: deferred Extremities: gyn Pulses: 2+ and symmetric Skin: Skin color, texture, turgor normal. No rashes or lesions Lymph nodes: Cervical, supraclavicular, and axillary nodes  normal. Neurologic: Alert and oriented X 3, normal strength and tone. Normal symmetric reflexes. Normal coordination and gait    Assessment:    Healthy female exam.      Plan:     ghm utd Check labs Pt does not want covid vaccine See After Visit Summary for Counseling Recommendations    1. Sinus headache Refill flonase  - fluticasone (FLONASE) 50 MCG/ACT nasal spray; Place 2 sprays into both nostrils daily.  Dispense: 16 g; Refill: 6  2. Gastroesophageal reflux disease Stable  Refill med - pantoprazole (PROTONIX) 40 MG tablet; Take 1 tablet (40 mg total) by mouth daily.  Dispense: 90 tablet; Refill: 3  3. Hyperlipidemia LDL goal <100 Encouraged heart healthy diet, increase exercise, avoid trans fats, consider a krill oil cap daily - rosuvastatin (CRESTOR) 20 MG tablet; Take 1 tablet (20 mg total) by mouth daily.  Dispense: 90 tablet; Refill: 1 - Comprehensive metabolic panel - Lipid panel  4. Type 2 diabetes mellitus with hyperglycemia, without long-term current use of insulin (HCC) hgba1c to be checked , minimize simple carbs. Increase exercise as tolerated. Continue current meds  - POCT Urinalysis Dipstick (Automated) - Comprehensive metabolic panel - Hemoglobin A1c - Microalbumin / creatinine urine ratio  5. Primary hypertension Well controlled, no changes to meds. Encouraged heart healthy diet such as the DASH diet and exercise as tolerated.   - hydrochlorothiazide (HYDRODIURIL) 25 MG tablet; Take 1 tablet (25 mg total) by mouth daily.  Dispense: 90 tablet; Refill: 3  6. Preventative health care See above  - Comprehensive metabolic panel - CBC with Differential/Platelet - Hemoglobin A1c - Lipid panel - TSH - Microalbumin / creatinine urine ratio  7. Vitamin D deficiency   - Vitamin D (25 hydroxy)  8. Dizziness No improvement ----mri ordered and f/u neuro    9. Other migraine without status migrainosus, not intractable F/u neuro  Mri ordered   10.  Need for hepatitis C screening test   - Hepatitis C antibody

## 2020-08-19 NOTE — Patient Instructions (Signed)
Preventive Care 56-56 Years Old, Female Preventive care refers to lifestyle choices and visits with your health care provider that can promote health and wellness. This includes:  A yearly physical exam. This is also called an annual wellness visit.  Regular dental and eye exams.  Immunizations.  Screening for certain conditions.  Healthy lifestyle choices, such as: ? Eating a healthy diet. ? Getting regular exercise. ? Not using drugs or products that contain nicotine and tobacco. ? Limiting alcohol use. What can I expect for my preventive care visit? Physical exam Your health care provider will check your:  Height and weight. These may be used to calculate your BMI (body mass index). BMI is a measurement that tells if you are at a healthy weight.  Heart rate and blood pressure.  Body temperature.  Skin for abnormal spots. Counseling Your health care provider may ask you questions about your:  Past medical problems.  Family's medical history.  Alcohol, tobacco, and drug use.  Emotional well-being.  Home life and relationship well-being.  Sexual activity.  Diet, exercise, and sleep habits.  Work and work Statistician.  Access to firearms.  Method of birth control.  Menstrual cycle.  Pregnancy history. What immunizations do I need? Vaccines are usually given at various ages, according to a schedule. Your health care provider will recommend vaccines for you based on your age, medical history, and lifestyle or other factors, such as travel or where you work.   What tests do I need? Blood tests  Lipid and cholesterol levels. These may be checked every 5 years, or more often if you are over 3 years old.  Hepatitis C test.  Hepatitis B test. Screening  Lung cancer screening. You may have this screening every year starting at age 56 if you have a 30-pack-year history of smoking and currently smoke or have quit within the past 15 years.  Colorectal cancer  screening. ? All adults should have this screening starting at age 56 and continuing until age 17. ? Your health care provider may recommend screening at age 56 if you are at increased risk. ? You will have tests every 1-10 years, depending on your results and the type of screening test.  Diabetes screening. ? This is done by checking your blood sugar (glucose) after you have not eaten for a while (fasting). ? You may have this done every 1-3 years.  Mammogram. ? This may be done every 1-2 years. ? Talk with your health care provider about when you should start having regular mammograms. This may depend on whether you have a family history of breast cancer.  BRCA-related cancer screening. This may be done if you have a family history of breast, ovarian, tubal, or peritoneal cancers.  Pelvic exam and Pap test. ? This may be done every 3 years starting at age 56. ? Starting at age 56, this may be done every 5 years if you have a Pap test in combination with an HPV test. Other tests  STD (sexually transmitted disease) testing, if you are at risk.  Bone density scan. This is done to screen for osteoporosis. You may have this scan if you are at high risk for osteoporosis. Talk with your health care provider about your test results, treatment options, and if necessary, the need for more tests. Follow these instructions at home: Eating and drinking  Eat a diet that includes fresh fruits and vegetables, whole grains, lean protein, and low-fat dairy products.  Take vitamin and mineral supplements  as recommended by your health care provider.  Do not drink alcohol if: ? Your health care provider tells you not to drink. ? You are pregnant, may be pregnant, or are planning to become pregnant.  If you drink alcohol: ? Limit how much you have to 0-1 drink a day. ? Be aware of how much alcohol is in your drink. In the [place] one drink equals one 12 oz bottle of beer (355 mL), one 5 oz glass of  wine (148 mL), or one 1 oz glass of hard liquor (44 mL).   Lifestyle  Take daily care of your teeth and gums. Brush your teeth every morning and night with fluoride toothpaste. Floss one time each day.  Stay active. Exercise for at least 30 minutes 5 or more days each week.  Do not use any products that contain nicotine or tobacco, such as cigarettes, e-cigarettes, and chewing tobacco. If you need help quitting, ask your health care provider.  Do not use drugs.  If you are sexually active, practice safe sex. Use a condom or other form of protection to prevent STIs (sexually transmitted infections).  If you do not wish to become pregnant, use a form of birth control. If you plan to become pregnant, see your health care provider for a prepregnancy visit.  If told by your health care provider, take low-dose aspirin daily starting at age 56.  Find healthy ways to cope with stress, such as: ? Meditation, yoga, or listening to music. ? Journaling. ? Talking to a trusted person. ? Spending time with friends and family. Safety  Always wear your seat belt while driving or riding in a vehicle.  Do not drive: ? If you have been drinking alcohol. Do not ride with someone who has been drinking. ? When you are tired or distracted. ? While texting.  Wear a helmet and other protective equipment during sports activities.  If you have firearms in your house, make sure you follow all gun safety procedures. What's next?  Visit your health care provider once a year for an annual wellness visit.  Ask your health care provider how often you should have your eyes and teeth checked.  Stay up to date on all vaccines. This information is not intended to replace advice given to you by your health care provider. Make sure you discuss any questions you have with your health care provider. Document Revised: 03/15/2020 Document Reviewed: 02/20/2018 Elsevier Patient Education  2021 [employer].

## 2020-08-21 ENCOUNTER — Other Ambulatory Visit: Payer: Self-pay | Admitting: Family Medicine

## 2020-08-21 ENCOUNTER — Encounter: Payer: Self-pay | Admitting: Family Medicine

## 2020-08-21 DIAGNOSIS — E1169 Type 2 diabetes mellitus with other specified complication: Secondary | ICD-10-CM

## 2020-08-21 DIAGNOSIS — E1165 Type 2 diabetes mellitus with hyperglycemia: Secondary | ICD-10-CM

## 2020-08-22 LAB — CBC WITH DIFFERENTIAL/PLATELET
Absolute Monocytes: 484 cells/uL (ref 200–950)
Basophils Absolute: 49 cells/uL (ref 0–200)
Basophils Relative: 0.6 %
Eosinophils Absolute: 131 cells/uL (ref 15–500)
Eosinophils Relative: 1.6 %
HCT: 41.7 % (ref 35.0–45.0)
Hemoglobin: 13.7 g/dL (ref 11.7–15.5)
Lymphs Abs: 3608 cells/uL (ref 850–3900)
MCH: 29 pg (ref 27.0–33.0)
MCHC: 32.9 g/dL (ref 32.0–36.0)
MCV: 88.3 fL (ref 80.0–100.0)
MPV: 11.5 fL (ref 7.5–12.5)
Monocytes Relative: 5.9 %
Neutro Abs: 3928 cells/uL (ref 1500–7800)
Neutrophils Relative %: 47.9 %
Platelets: 320 10*3/uL (ref 140–400)
RBC: 4.72 10*6/uL (ref 3.80–5.10)
RDW: 12.5 % (ref 11.0–15.0)
Total Lymphocyte: 44 %
WBC: 8.2 10*3/uL (ref 3.8–10.8)

## 2020-08-22 LAB — HEPATITIS C ANTIBODY
Hepatitis C Ab: NONREACTIVE
SIGNAL TO CUT-OFF: 0.02 (ref <1.00)

## 2020-08-22 LAB — COMPREHENSIVE METABOLIC PANEL
AG Ratio: 1.8 (calc) (ref 1.0–2.5)
ALT: 23 U/L (ref 6–29)
AST: 22 U/L (ref 10–35)
Albumin: 4.5 g/dL (ref 3.6–5.1)
Alkaline phosphatase (APISO): 39 U/L (ref 37–153)
BUN: 9 mg/dL (ref 7–25)
CO2: 27 mmol/L (ref 20–32)
Calcium: 9.4 mg/dL (ref 8.6–10.4)
Chloride: 101 mmol/L (ref 98–110)
Creat: 0.51 mg/dL (ref 0.50–1.05)
Globulin: 2.5 g/dL (calc) (ref 1.9–3.7)
Glucose, Bld: 137 mg/dL — ABNORMAL HIGH (ref 65–99)
Potassium: 3.7 mmol/L (ref 3.5–5.3)
Sodium: 140 mmol/L (ref 135–146)
Total Bilirubin: 0.6 mg/dL (ref 0.2–1.2)
Total Protein: 7 g/dL (ref 6.1–8.1)

## 2020-08-22 LAB — LIPID PANEL
Cholesterol: 112 mg/dL (ref <200)
HDL: 38 mg/dL — ABNORMAL LOW (ref >=50)
LDL Cholesterol (Calc): 54 mg/dL (calc)
Non-HDL Cholesterol (Calc): 74 mg/dL (calc) (ref <130)
Total CHOL/HDL Ratio: 2.9 (calc) (ref <5.0)
Triglycerides: 123 mg/dL (ref <150)

## 2020-08-22 LAB — MICROALBUMIN / CREATININE URINE RATIO
Creatinine, Urine: 172 mg/dL (ref 20–275)
Microalb Creat Ratio: 80 mcg/mg creat — ABNORMAL HIGH (ref <30)
Microalb, Ur: 13.7 mg/dL

## 2020-08-22 LAB — HEMOGLOBIN A1C
Hgb A1c MFr Bld: 9.8 % of total Hgb — ABNORMAL HIGH (ref <5.7)
Mean Plasma Glucose: 235 mg/dL
eAG (mmol/L): 13 mmol/L

## 2020-08-22 LAB — VITAMIN D 25 HYDROXY (VIT D DEFICIENCY, FRACTURES): Vit D, 25-Hydroxy: 33 ng/mL (ref 30–100)

## 2020-08-22 LAB — TSH: TSH: 0.85 mIU/L

## 2020-08-29 ENCOUNTER — Telehealth: Payer: Self-pay | Admitting: Family Medicine

## 2020-08-29 NOTE — Telephone Encounter (Signed)
Caller : Felicia Solis  Call Back @ [phone]  Patient states she would like a call back from [facility] to discuss recent changes in her medication(patient states Dr Etter Sjogren increase her dosage of ozempic )  and would like to discuss patient assistance or samples    Please Advise

## 2020-08-30 ENCOUNTER — Ambulatory Visit (HOSPITAL_COMMUNITY): Admission: RE | Admit: 2020-08-30 | Payer: Self-pay | Source: Ambulatory Visit

## 2020-08-30 ENCOUNTER — Telehealth: Payer: Self-pay | Admitting: *Deleted

## 2020-08-30 ENCOUNTER — Other Ambulatory Visit: Payer: Self-pay | Admitting: Family Medicine

## 2020-08-30 DIAGNOSIS — F4000 Agoraphobia, unspecified: Secondary | ICD-10-CM

## 2020-08-30 MED ORDER — ALPRAZOLAM 0.5 MG PO TABS: ORAL_TABLET | ORAL | 0 refills | Status: DC

## 2020-08-30 NOTE — Telephone Encounter (Signed)
Spoke with patient advised her to double dose of what she has per Dr. Etter Sjogren.   When we get order for the 0.5mg  then we will fax over a order change.  Should be expecting Ozempic this week Thursday or Friday.

## 2020-08-30 NOTE — Telephone Encounter (Signed)
Xanax 0.5 mg sent to pharmacy--- take 1 30 min prior to procedure and bring meds with her to [facility]

## 2020-08-30 NOTE — Telephone Encounter (Signed)
Patient states that she will need a medication to calm her for the MRI.  She is claustrophobic.  Her MRI is scheduled for tomorrow.

## 2020-08-31 ENCOUNTER — Ambulatory Visit (HOSPITAL_COMMUNITY): Payer: Self-pay

## 2020-08-31 NOTE — Telephone Encounter (Signed)
Patient notified that rx has been sent in. 

## 2020-08-31 NOTE — Telephone Encounter (Signed)
Patient notified that medication is ready for pickup and to let us know about 2 weeks before she is due to be refilled to let us know we need to send in reorder for the 1mg  (increase in dose)

## 2020-09-09 ENCOUNTER — Ambulatory Visit (HOSPITAL_COMMUNITY)
Admission: RE | Admit: 2020-09-09 | Discharge: 2020-09-09 | Disposition: A | Discharge status: Home / Self Care | Payer: Self-pay | Source: Ambulatory Visit | Attending: Family Medicine | Admitting: Family Medicine

## 2020-09-09 ENCOUNTER — Other Ambulatory Visit: Payer: Self-pay

## 2020-09-09 DIAGNOSIS — R42 Dizziness and giddiness: Secondary | ICD-10-CM | POA: Insufficient documentation

## 2020-09-09 DIAGNOSIS — G4489 Other headache syndrome: Secondary | ICD-10-CM | POA: Insufficient documentation

## 2020-09-11 ENCOUNTER — Other Ambulatory Visit: Payer: Self-pay | Admitting: Family Medicine

## 2020-09-11 DIAGNOSIS — E785 Hyperlipidemia, unspecified: Secondary | ICD-10-CM

## 2020-09-23 ENCOUNTER — Telehealth: Payer: Self-pay | Admitting: Family Medicine

## 2020-09-23 NOTE — Telephone Encounter (Signed)
Spoke with patient. Pt verbalized understanding of results

## 2020-09-23 NOTE — Telephone Encounter (Signed)
Patient is requesting someone to call her to go over MRI results    Please

## 2020-10-02 ENCOUNTER — Other Ambulatory Visit: Payer: Self-pay | Admitting: Family Medicine

## 2020-10-13 ENCOUNTER — Encounter: Payer: Self-pay | Admitting: Family

## 2020-10-13 ENCOUNTER — Telehealth (INDEPENDENT_AMBULATORY_CARE_PROVIDER_SITE_OTHER): Payer: Self-pay | Admitting: Family

## 2020-10-13 VITALS — Ht 66.0 in | Wt 223.0 lb

## 2020-10-13 DIAGNOSIS — B9689 Other specified bacterial agents as the cause of diseases classified elsewhere: Secondary | ICD-10-CM

## 2020-10-13 DIAGNOSIS — J019 Acute sinusitis, unspecified: Secondary | ICD-10-CM

## 2020-10-13 MED ORDER — AMOXICILLIN 500 MG PO CAPS: 500.0000 mg | ORAL_CAPSULE | Freq: Two times a day (BID) | ORAL | 0 refills | Status: AC

## 2020-10-13 NOTE — Patient Instructions (Signed)

## 2020-10-13 NOTE — Progress Notes (Signed)
Virtual Visit via Telephone Note  I connected with Felicia Solis on 10/13/20 at  1:40 PM EDT by telephone and verified that I am speaking with the correct person using two identifiers.  Location: Patient: Work Provider: [employer]   I discussed the limitations, risks, security and privacy concerns of performing an evaluation and management service by telephone and the availability of in person appointments. I also discussed with the patient that there may be a patient responsible charge related to this service. The patient expressed understanding and agreed to proceed.   History of Present Illness: 55 year old female with a history of DM is in via telephone visit with c/o sinus pressure, sinus pain, cough, nasal congestion x 3 days and worsening. She reports being around her grandchild who was sick over the weekend. She has been using her Flonase  She is not vaccinated for flu or COVID.     Observations/Objective: A&O, NAD   Assessment and Plan: Felicia Solis was seen today for sinus problem.  Diagnoses and all orders for this visit:  Acute bacterial sinusitis  Other orders -     amoxicillin (AMOXIL) 500 MG capsule; Take 1 capsule (500 mg total) by mouth 2 (two) times daily for 7 days.  Flonase 2 sprays in each nostril once a day. Advised COVID-19 testing. Rest. Drink plenty of fluids.    Follow Up Instructions: Call if symptoms worsen or persist.     I discussed the assessment and treatment plan with the patient. The patient was provided an opportunity to ask questions and all were answered. The patient agreed with the plan and demonstrated an understanding of the instructions.   The patient was advised to call back or seek an in-person evaluation if the symptoms worsen or if the condition fails to improve as anticipated.  I provided 15 minutes of non-face-to-face time during this encounter.   Kennyth Arnold, FNP

## 2020-10-14 ENCOUNTER — Ambulatory Visit: Payer: Self-pay | Attending: Critical Care Medicine

## 2020-10-14 DIAGNOSIS — Z20822 Contact with and (suspected) exposure to covid-19: Secondary | ICD-10-CM | POA: Insufficient documentation

## 2020-10-18 ENCOUNTER — Telehealth: Payer: Self-pay | Admitting: *Deleted

## 2020-10-18 NOTE — Telephone Encounter (Signed)
Patient called for refill request for ozempic.  Form faxed today refill.

## 2020-10-20 NOTE — Telephone Encounter (Signed)
Spoke with norvo nordisk and expected date that we will received medication will be around 5/13-5/18

## 2020-10-28 NOTE — Telephone Encounter (Signed)
Called pt to inform her that her that her pt assistance is in. She is aware and voices understanding.

## 2020-11-06 ENCOUNTER — Other Ambulatory Visit: Payer: Self-pay | Admitting: Family Medicine

## 2020-11-07 ENCOUNTER — Ambulatory Visit
Admission: RE | Admit: 2020-11-07 | Discharge: 2020-11-07 | Disposition: A | Discharge status: Home / Self Care | Payer: No Typology Code available for payment source | Source: Ambulatory Visit | Attending: Family Medicine | Admitting: Family Medicine

## 2020-11-07 ENCOUNTER — Other Ambulatory Visit: Payer: Self-pay

## 2020-11-07 DIAGNOSIS — R921 Mammographic calcification found on diagnostic imaging of breast: Secondary | ICD-10-CM

## 2020-11-14 ENCOUNTER — Other Ambulatory Visit: Payer: Self-pay

## 2020-11-14 ENCOUNTER — Ambulatory Visit (INDEPENDENT_AMBULATORY_CARE_PROVIDER_SITE_OTHER): Payer: Self-pay | Admitting: Family Medicine

## 2020-11-14 VITALS — BP 110/82 | Ht 66.0 in | Wt 223.0 lb

## 2020-11-14 DIAGNOSIS — M654 Radial styloid tenosynovitis [de Quervain]: Secondary | ICD-10-CM

## 2020-11-14 NOTE — Patient Instructions (Addendum)
We will go ahead with hand therapy referral. Consider repeat injection, MRI if not improving. Follow up with me in 6 weeks at the latest.

## 2020-11-15 ENCOUNTER — Encounter: Payer: Self-pay | Admitting: Family Medicine

## 2020-11-15 NOTE — Progress Notes (Signed)
PCP: [name], DO  Subjective:   HPI: Patient is a 55 y.o. female here for left wrist pain.  05/04/20: Patient is a 55 y.o. female here for reevaluation of left wrist pain.  She was previously diagnosed with de Quervain's tenosynovitis in 08/2019, at that time she was treated conservatively with thumb spica brace and NSAIDs, she did not have relieved and therefore received a corticosteroid injection under ultrasound guidance in 10/2019.  She had improvement in symptoms for 1 to 2 months, however over the last month or 2 has had recurrence and worsening symptoms.  She has pain primarily over the radial aspect of her dorsal wrist.  She also has some pain more proximally in her dorsal forearm and extending up into her shoulder.  No numbness or tingling.  No neck pain.  No weakness.  11/14/20: Patient reports she had some improvement since last visit using thumb spica brace and taking ibuprofen but pain has still persisted left wrist. Associated pain up elbow into left shoulder too. No numbness or tingling. No pain with neck motions. Pain is radial side of wrist. No new injuries. Right handed.  Past Medical History:  Diagnosis Date  . Anemia    anemia  when pregnant in 2002  . Anxiety   . Arthritis    "knees and back" (09/28/2016)  . Asthma 5 yrs ago    told she had asthma.. does not remember doctors name.   . Chronic lower back pain   . Depression    takes citalopram  . History of hiatal hernia   . History of kidney stones   . Hyperlipidemia   . Hypothyroidism   . LBBB (left bundle branch block)    rate-related; identified on stress test 03/21/16  . Migraine    "weekly" (09/28/2016)  . 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 Shortness of breath    with exertion  . Thyroid disease   . Type II diabetes mellitus ([place]) dx'd 2016    Current Outpatient Medications on File Prior to Visit  Medication Sig Dispense Refill   . levothyroxine (EUTHYROX) 88 MCG tablet Take 1 tablet (88 mcg total) by mouth daily before breakfast. 90 tablet 1  . acetaminophen (TYLENOL) 500 MG tablet Take 1,000 mg by mouth every 6 (six) hours as needed for mild pain.    Marland Kitchen ALPRAZolam (XANAX) 0.5 MG tablet 1 po 30 min prior to procedure 10 tablet 0  . aspirin EC 81 MG tablet Take 1 tablet (81 mg total) by mouth daily. 90 tablet 3  . blood glucose meter kit and supplies KIT Dispense based on patient and insurance preference. Use up to four times daily as directed. (FOR ICD-9 250.00, 250.01). 1 each 0  . Cholecalciferol (VITAMIN D3) 2000 units TABS Take 2,000 Units by mouth daily.     . citalopram (CELEXA) 20 MG tablet Take 1 tablet by mouth once daily 90 tablet 1  . cyanocobalamin 500 MCG tablet Take 1,000 mcg by mouth daily.     . Flaxseed, Linseed, (FLAXSEED OIL PO) Take 1,300 mg by mouth daily.    . fluticasone (FLONASE) 50 MCG/ACT nasal spray Place 2 sprays into both nostrils daily. 16 g 6  . glimepiride (AMARYL) 2 MG tablet TAKE 1 TABLET BY MOUTH ONCE DAILY BEFORE BREAKFAST 90 tablet 0  . glucose blood (ONETOUCH VERIO) test strip Onetouch Verio Check blood sugar twice daily 100  each 12  . indapamide (LOZOL) 1.25 MG tablet Take 1.25 mg by mouth daily.    Marland Kitchen losartan (COZAAR) 50 MG tablet Take 1 tablet (50 mg total) by mouth daily. 90 tablet 1  . methocarbamol (ROBAXIN) 500 MG tablet Take 1 tablet (500 mg total) by mouth 4 (four) times daily. 45 tablet 1  . Omega-3 Fatty Acids (FISH OIL) 1200 MG CAPS Take 1,200 mg by mouth daily.    Glory Rosebush Delica Lancets 62M MISC 1 Device by Does not apply route 2 (two) times daily as needed. 60 each 5  . pantoprazole (PROTONIX) 40 MG tablet Take 1 tablet (40 mg total) by mouth daily. 90 tablet 3  . rosuvastatin (CRESTOR) 20 MG tablet Take 1 tablet (20 mg total) by mouth daily. 90 tablet 1  . Semaglutide,0.25 or 0.5MG/DOS, (OZEMPIC, 0.25 OR 0.5 MG/DOSE,) 2 MG/1.5ML SOPN Inject 0.5 mg into the skin once  a week. 1.5 mL 5   No current facility-administered medications on file prior to visit.    Past Surgical History:  Procedure Laterality Date  . DILATION AND CURETTAGE OF UTERUS  1999  . ENDOMETRIAL ABLATION    . EXTRACORPOREAL SHOCK WAVE LITHOTRIPSY Right 04/02/2019   Procedure: EXTRACORPOREAL SHOCK WAVE LITHOTRIPSY (ESWL);  Surgeon: Ceasar Mons, MD;  Location: [facility];  Service: Urology;  Laterality: Right;  . LAPAROSCOPY     years ago ..pt does not know where surgery was done ... states" my stomach would blow up then go down"  . THYROIDECTOMY  09/28/2011   Procedure: THYROIDECTOMY;  Surgeon: Izora Gala, MD;  Location: [facility];  Service: ENT;  Laterality: Right;  RIGHT THYROIDECTOMY WITH FROZEN SECTION  . THYROIDECTOMY  09/28/2016   completion of thyroidectomy/notes 09/28/2016  . THYROIDECTOMY Left 09/28/2016   Procedure: COMPLETION OF THYROIDECTOMY;  Surgeon: Izora Gala, MD;  Location: [place];  Service: ENT;  Laterality: Left;  . TUBAL LIGATION      Allergies  Allergen Reactions  . Lisinopril Cough    Social History   Socioeconomic History  . Marital status: Married    Spouse name: Not on file  . Number of children: 3  . Years of education: Not on file  . Highest education level: Not on file  Occupational History  . Occupation: CNA  Tobacco Use  . Smoking status: Never Smoker  . Smokeless tobacco: Never Used  Vaping Use  . Vaping Use: Never used  Substance and Sexual Activity  . Alcohol use: No    Alcohol/week: 0.0 standard drinks  . Drug use: No  . Sexual activity: Not on file  Other Topics Concern  . Not on file  Social History Narrative   No exercise--  Walking and lifting pts at work   Right handed   Lives in single story home   Social Determinants of Health   Financial Resource Strain: Not on file  Food Insecurity: Not on file  Transportation Needs: Not on file  Physical Activity: Not on file  Stress: Not on file  Social Connections: Not on  file  Intimate Partner Violence: Not on file    Family History  Problem Relation Age of Onset  . Diabetes Mother   . Hypertension Mother   . Hyperlipidemia Mother   . Gallbladder disease Mother   . Heart disease Mother   . Diabetes Sister   . Hypertension Father   . Hyperlipidemia Father   . Colon polyps Father   . Ovarian cancer Maternal Grandmother   .  Gallbladder disease Maternal Grandmother   . Breast cancer Maternal Grandmother 8  . Colon cancer Paternal Grandfather   . Breast cancer Maternal Aunt        late 15s  . Heart disease Other        early cad  . Diabetes Brother   . Heart attack Brother   . Anesthesia problems Neg Hx   . Hypotension Neg Hx   . Malignant hyperthermia Neg Hx   . Pseudochol deficiency Neg Hx   . Esophageal cancer Neg Hx     BP 110/82   Ht _0  (1.676 m)   Wt 223 lb (101.2 kg)   BMI 35.99 kg/m   No flowsheet data found.  No flowsheet data found.  Review of Systems: See HPI above.     Objective:  Physical Exam:  Gen: NAD, comfortable in exam room  Left wrist: No deformity, swelling, bruising. FROM with 5/5 strength. Tenderness to palpation 1st dorsal compartment.  No other tenderness. NVI distally. Negative tinels, phalens. Positive finkelsteins.                        Left shoulder: No swelling, ecchymoses.  No gross deformity. No TTP. FROM. Negative Hawkins, Neers. Strength 5/5 with empty can and resisted internal/external rotation. NV intact distally.  Assessment & Plan:  1. Left wrist pain - 2/2 dequervains tenosynovitis.  Has had 1 injection in the past, taken ibuprofen and used thumb spica brace.  Discussed repeat injection, MRI, hand therapy referral - will go ahead with referral to hand therapy.  F/u in 6 weeks.

## 2020-11-22 ENCOUNTER — Other Ambulatory Visit: Payer: Self-pay

## 2020-11-23 ENCOUNTER — Other Ambulatory Visit: Payer: Self-pay

## 2020-12-02 ENCOUNTER — Other Ambulatory Visit: Payer: Self-pay

## 2020-12-02 ENCOUNTER — Ambulatory Visit (INDEPENDENT_AMBULATORY_CARE_PROVIDER_SITE_OTHER): Payer: Self-pay | Admitting: Neurology

## 2020-12-02 ENCOUNTER — Encounter: Payer: Self-pay | Admitting: Neurology

## 2020-12-02 VITALS — BP 112/76 | HR 80 | Ht 66.0 in | Wt 226.2 lb

## 2020-12-02 DIAGNOSIS — R42 Dizziness and giddiness: Secondary | ICD-10-CM

## 2020-12-02 DIAGNOSIS — G43009 Migraine without aura, not intractable, without status migrainosus: Secondary | ICD-10-CM

## 2020-12-02 MED ORDER — MECLIZINE HCL 25 MG PO TABS: 25.0000 mg | ORAL_TABLET | Freq: Three times a day (TID) | ORAL | 5 refills | Status: DC | PRN

## 2020-12-02 MED ORDER — ONDANSETRON HCL 4 MG PO TABS: 4.0000 mg | ORAL_TABLET | Freq: Three times a day (TID) | ORAL | 5 refills | Status: DC | PRN

## 2020-12-02 MED ORDER — PROPRANOLOL HCL 20 MG PO TABS: 40.0000 mg | ORAL_TABLET | Freq: Two times a day (BID) | ORAL | 3 refills | Status: DC

## 2020-12-02 NOTE — Patient Instructions (Signed)
  Start propranolol 40mg  twice daily.  If no improvement in headaches in 4 weeks, contact me and we can increase dose.  If you experience lightheadedness, contact us. Take Excedrin Migraine (aspirin-acetaminophen-caffeine) as needed but limit use of pain relievers to no more than 2 days out of the week.  These medications include acetaminophen, NSAIDs (ibuprofen/Advil/Motrin, naproxen/Aleve, triptans (Imitrex/sumatriptan), Excedrin, and narcotics.  This will help reduce risk of rebound headaches. If dizzy spells are severe, may use meclizine as directed but limit use. Be aware of common food triggers:  - Caffeine:  coffee, black tea, cola, Mt. Dew  - Chocolate  - Dairy:  aged cheeses (brie, blue, cheddar, gouda, [place], provolone, [place], Swiss, etc), chocolate milk, buttermilk, sour cream, limit eggs and yogurt  - Nuts, peanut butter  - Alcohol  - Cereals/grains:  FRESH breads (fresh bagels, sourdough, doughnuts), yeast productions  - Processed/canned/aged/cured meats (pre-packaged deli meats, hotdogs)  - MSG/glutamate:  soy sauce, flavor enhancer, pickled/preserved/marinated foods  - Sweeteners:  aspartame (Equal, Nutrasweet).  Sugar and Splenda are okay  - Vegetables:  legumes (lima beans, lentils, snow peas, fava beans, pinto peans, peas, garbanzo beans), sauerkraut, onions, olives, pickles  - Fruit:  avocados, bananas, citrus fruit (orange, lemon, grapefruit), mango  - Other:  Frozen meals, macaroni and cheese Routine exercise Stay adequately hydrated (aim for 64 oz water daily) Keep headache diary Maintain proper stress management Maintain proper sleep hygiene Do not skip meals Consider supplements:  magnesium citrate 400mg  daily, riboflavin 400mg  daily, coenzyme Q10 100mg  three times daily.

## 2020-12-02 NOTE — Progress Notes (Signed)
NEUROLOGY FOLLOW UP OFFICE NOTE  LYNSI DOONER [id number]  Assessment/Plan:   1.  Migraine without aura, without status migrainosus, not intractable 2.  Vertigo, possibly migraine-associated.  She reports that her right ear sometimes feels clogged, raising possibility of eustachian tube dysfunction   For migraine prevention:  Start propranolol 72m BID.  If no improvement in 4 weeks, we can increase dose.  If experiences lightheadedness, would discontinue For migraine rescue:  May use Excedrin Migraine.  Limit use of pain relievers to no more than 2 days out of week to prevent risk of rebound or medication-overuse headache. For vertigo spells:  May use meclizine if absolutely needed (limit use) and Zofran for nausea Follow up in 6 months.    Subjective:  WIsadore Bokhari MWalsworthis a 55year old female who follows up for dizziness and headache.   UPDATE: Last seen in May 2021.  At that time, I had started her on zonisamide to treat migraine but she never picked it up.  They are moderate, lasting all day and occurring 2 to 3 days a week.  Dizzy spells are infrequent.  They have been occurring at least 3 times a month but last one was a month ago.  It is more severe and now vomiting.  They can last up to 15 minutes.  Sometimes her right ear feels stuffed up.  She had an MRI of brain without contrast on 09/09/2020 which was personally reviewed and showed mild to moderate nonspecific changes in the cerebral white matter, likely chronic small vessel ischemic changes or sequela of migraine.     Current NSAIDs:  ASA 831mdaily Current analgesics:  Tylenol; Excedrin Migraine (hasn't been able to find in the store, which works better than Tylenol). Current muscle relaxant:  Robaxin Current antihypertensive:  Losartan Current antidepressant:  Citalopram 2069murrent antihistamine/decongestants:  Astelin; Flonase   HISTORY: In December 2020, she began experiencing dizziness described as spinning  sensation.  It first occurred upon getting up after she was bent over in the kitchen to clean the floor.  However, it occurs spontaneously as well.  It would last a couple of minutes.  No visual disturbance, nausea, vomiting, photophobia, phonophobia, weakness, slurred speech or headache.  She also felt that her left ear was clogged up and would pop.  She initially went to her PCP who found cerumen impaction which was removed and she was prescribed seasonal allergies and amoxicillin for possible otitis media.  However, she continued to have these spells.  She was evaluated by ENT, Dr. RosConstance HolsterHer exam was unremarkable and she reported no recent episodes.  She was advised to follow up if symptoms return.  However, the vertigo returned.  She also started developing aural fullness in her right ear as well (like she was on an airplane) associated with muffled hearing.  No tinnitus.  On one occasion, the episode was followed by a 7/10 right pressure-like retro-orbital headache lasting for about an hour.  Associated with photophobia and phonophobia but not nausea, vomiting or visual disturbance.  On occasion, she has a pressure on the top of her head.  She reports remote history of migraines.  Over the past year, she developed a right cervical radiculopathy with pain and numbness.  MRI of cervical spine from 12/24/2018 was personally reviewed and showed C6-7 disc extrusion with right-sided spinal stenosis and neural foraminal stenosis affecting the C7 nerve root.    Sometimes she feels disoriented.  Not with the headaches but may  occur prior to a headache.     Past NSAIDs:  Diclofenac; meloxicam; naproxen Past analgesic:  Tylenol; tramadol Past anti-emetic:  Zofran 40m Past muscle relaxant:  Flexeril Past antihypertensive:  HCTZ; lisinopril Past antidepressant:  Sertraline Past antiepileptic:  Gabapentin. Would avoid topiramate due to history of recurrent kidney stones. Past antihistamine/decongestant:   Claritin  PAST MEDICAL HISTORY: Past Medical History:  Diagnosis Date   Anemia    anemia  when pregnant in 2002   Anxiety    Arthritis    "knees and back" (09/28/2016)   Asthma 5 yrs ago    told she had asthma.. does not remember doctors name.    Chronic lower back pain    Depression    takes citalopram   History of hiatal hernia    History of kidney stones    Hyperlipidemia    Hypothyroidism    LBBB (left bundle branch block)    rate-related; identified on stress test 03/21/16   Migraine    "weekly" (09/28/2016)   Positive TB test 2008   pt states she took 9 months of medicine for positive tb skin test   Shortness of breath    with exertion   Thyroid disease    [place]    MEDICATIONS: Current Outpatient Medications on File Prior to Visit  Medication Sig Dispense Refill   levothyroxine (EUTHYROX) 88 MCG tablet Take 1 tablet (88 mcg total) by mouth daily before breakfast. 90 tablet 1   acetaminophen (TYLENOL) 500 MG tablet Take 1,000 mg by mouth every 6 (six) hours as needed for mild pain.     ALPRAZolam (XANAX) 0.5 MG tablet 1 po 30 min prior to procedure 10 tablet 0   aspirin EC 81 MG tablet Take 1 tablet (81 mg total) by mouth daily. 90 tablet 3   blood glucose meter kit and supplies KIT Dispense based on patient and insurance preference. Use up to four times daily as directed. (FOR ICD-9 250.00, 250.01). 1 each 0   Cholecalciferol (VITAMIN D3) 2000 units TABS Take 2,000 Units by mouth daily.      citalopram (CELEXA) 20 MG tablet Take 1 tablet by mouth once daily 90 tablet 1   cyanocobalamin 500 MCG tablet Take 1,000 mcg by mouth daily.      Flaxseed, Linseed, (FLAXSEED OIL PO) Take 1,300 mg by mouth daily.     fluticasone (FLONASE) 50 MCG/ACT nasal spray Place 2 sprays into both nostrils daily. 16 g 6   glimepiride (AMARYL) 2 MG tablet TAKE 1 TABLET BY MOUTH ONCE DAILY BEFORE BREAKFAST 90 tablet 0   glucose blood (ONETOUCH VERIO) test strip  Onetouch Verio Check blood sugar twice daily 100 each 12   indapamide (LOZOL) 1.25 MG tablet Take 1.25 mg by mouth daily.     losartan (COZAAR) 50 MG tablet Take 1 tablet (50 mg total) by mouth daily. 90 tablet 1   methocarbamol (ROBAXIN) 500 MG tablet Take 1 tablet (500 mg total) by mouth 4 (four) times daily. 45 tablet 1   Omega-3 Fatty Acids (FISH OIL) 1200 MG CAPS Take 1,200 mg by mouth daily.     OneTouch Delica Lancets 395JMISC 1 Device by Does not apply route 2 (two) times daily as needed. 60 each 5   pantoprazole (PROTONIX) 40 MG tablet Take 1 tablet (40 mg total) by mouth daily. 90 tablet 3   rosuvastatin (CRESTOR) 20 MG tablet Take 1 tablet (20 mg total) by mouth daily. 9[place]  tablet 1   Semaglutide,0.25 or 0.5MG/DOS, (OZEMPIC, 0.25 OR 0.5 MG/DOSE,) 2 MG/1.5ML SOPN Inject 0.5 mg into the skin once a week. 1.5 mL 5   No current facility-administered medications on file prior to visit.    ALLERGIES: Allergies  Allergen Reactions   Lisinopril Cough    FAMILY HISTORY: Family History  Problem Relation Age of Onset   Diabetes Mother    Hypertension Mother    Hyperlipidemia Mother    Gallbladder disease Mother    Heart disease Mother    Diabetes Sister    Hypertension Father    Hyperlipidemia Father    Colon polyps Father    Ovarian cancer Maternal Grandmother    Gallbladder disease Maternal Grandmother    Breast cancer Maternal Grandmother 66   Colon cancer Paternal Grandfather    Breast cancer Maternal Aunt        late 54s   Heart disease Other        early cad   Diabetes Brother    Heart attack Brother    Anesthesia problems Neg Hx    Hypotension Neg Hx    Malignant hyperthermia Neg Hx    Pseudochol deficiency Neg Hx    Esophageal cancer Neg Hx       Objective:  Blood pressure 112/76, pulse 80, height _0  (1.676 m), weight 226 lb 3.2 oz (102.6 kg), SpO2 98 %. General: No acute distress.  Patient appears well-groomed.   Head:  Normocephalic/atraumatic Eyes:   Fundi examined but not visualized Neck: supple, no paraspinal tenderness, full range of motion Heart:  Regular rate and rhythm Lungs:  Clear to auscultation bilaterally Back: No paraspinal tenderness Neurological Exam: alert and oriented to person, place, and time.  Speech fluent and not dysarthric, language intact.  CN II-XII intact. Bulk and tone normal, muscle strength 5/5 throughout.  Sensation to light touch intact.  Deep tendon reflexes 2+ throughout.  Finger to nose testing intact.  Gait normal, Romberg negative.   Metta Clines, DO  CC: Roma Schanz, DO

## 2020-12-06 ENCOUNTER — Other Ambulatory Visit: Payer: Self-pay

## 2020-12-06 ENCOUNTER — Telehealth (INDEPENDENT_AMBULATORY_CARE_PROVIDER_SITE_OTHER): Payer: Self-pay | Admitting: Family Medicine

## 2020-12-06 DIAGNOSIS — K529 Noninfective gastroenteritis and colitis, unspecified: Secondary | ICD-10-CM

## 2020-12-06 NOTE — Progress Notes (Signed)
MyChart Video Visit    Virtual Visit via Video Note   This visit type was conducted due to national recommendations for restrictions regarding the COVID-19 Pandemic (e.g. social distancing) in an effort to limit this patient's exposure and mitigate transmission in our community. This patient is at least at moderate risk for complications without adequate follow up. This format is felt to be most appropriate for this patient at this time. Physical exam was limited by quality of the video and audio technology used for the visit. Felicia Solis was able to get the patient set up on a video visit.  Patient location: Home Patient and provider in visit Provider location: Office  I discussed the limitations of evaluation and management by telemedicine and the availability of in person appointments. The patient expressed understanding and agreed to proceed.  Visit Date: 12/06/2020  Today's healthcare provider: [name], DO     Subjective    Patient ID: Felicia Solis, female    DOB: 06/04/1966, 55 y.o.   MRN: [id number]  Chief Complaint  Patient presents with   Diarrhea    Complains of diarrhea that started this morning at about 1 am   Vomiting    Complains of vomiting that started at about 6:30 am    HPI Patient is in today for a video visit for diarrhea and vomiting ---- the diarrhea started at 1am and the vomiting started at 630 am.  No fevers   pt ate a"crazy" mixture of food yesterday and thought maybe it was that    home covid test was neg.  Her stomach still feels a little off but she is better   the diarrhea has stopped and no more vomiting   Past Medical History:  Diagnosis Date   Anemia    anemia  when pregnant in 2002   Anxiety    Arthritis    "knees and back" (09/28/2016)   Asthma 5 yrs ago    told she had asthma.. does not remember doctors name.    Chronic lower back pain    Depression    takes citalopram   History of hiatal hernia    History of kidney  stones    Hyperlipidemia    Hypothyroidism    LBBB (left bundle branch block)    rate-related; identified on stress test 03/21/16   Migraine    "weekly" (09/28/2016)   Positive TB test 2008   pt states she took 9 months of medicine for positive tb skin test   Shortness of breath    with exertion   Thyroid disease    Type II diabetes mellitus ([place]) dx'd 2016    Past Surgical History:  Procedure Laterality Date   DILATION AND CURETTAGE OF UTERUS  1999   ENDOMETRIAL ABLATION     EXTRACORPOREAL SHOCK WAVE LITHOTRIPSY Right 04/02/2019   Procedure: EXTRACORPOREAL SHOCK WAVE LITHOTRIPSY (ESWL);  Surgeon: Ceasar Mons, MD;  Location: [facility];  Service: Urology;  Laterality: Right;   LAPAROSCOPY     years ago ..pt does not know where surgery was done ... states" my stomach would blow up then go down"   THYROIDECTOMY  09/28/2011   Procedure: THYROIDECTOMY;  Surgeon: Izora Gala, MD;  Location: [place];  Service: ENT;  Laterality: Right;  RIGHT THYROIDECTOMY WITH FROZEN SECTION   THYROIDECTOMY  09/28/2016   completion of thyroidectomy/notes 09/28/2016   THYROIDECTOMY Left 09/28/2016   Procedure: COMPLETION OF THYROIDECTOMY;  Surgeon: Izora Gala, MD;  Location:  [facility];  Service: ENT;  Laterality: Left;   TUBAL LIGATION      Family History  Problem Relation Age of Onset   Diabetes Mother    Hypertension Mother    Hyperlipidemia Mother    Gallbladder disease Mother    Heart disease Mother    Diabetes Sister    Hypertension Father    Hyperlipidemia Father    Colon polyps Father    Ovarian cancer Maternal Grandmother    Gallbladder disease Maternal Grandmother    Breast cancer Maternal Grandmother 31   Colon cancer Paternal Grandfather    Breast cancer Maternal Aunt        late 46s   Heart disease Other        early cad   Diabetes Brother    Heart attack Brother    Anesthesia problems Neg Hx    Hypotension Neg Hx    Malignant hyperthermia Neg Hx    Pseudochol deficiency Neg Hx     Esophageal cancer Neg Hx     Social History   Socioeconomic History   Marital status: Married    Spouse name: Not on file   Number of children: 3   Years of education: Not on file   Highest education level: Not on file  Occupational History   Occupation: CNA  Tobacco Use   Smoking status: Never   Smokeless tobacco: Never  Vaping Use   Vaping Use: Never used  Substance and Sexual Activity   Alcohol use: No    Alcohol/week: 0.0 standard drinks   Drug use: No   Sexual activity: Not on file  Other Topics Concern   Not on file  Social History Narrative   No exercise--  Walking and lifting pts at work   Right handed   Lives in single story home   Social Determinants of Health   Financial Resource Strain: Not on file  Food Insecurity: Not on file  Transportation Needs: Not on file  Physical Activity: Not on file  Stress: Not on file  Social Connections: Not on file  Intimate Partner Violence: Not on file    Outpatient Medications Prior to Visit  Medication Sig Dispense Refill   acetaminophen (TYLENOL) 500 MG tablet Take 1,000 mg by mouth every 6 (six) hours as needed for mild pain.     aspirin EC 81 MG tablet Take 1 tablet (81 mg total) by mouth daily. 90 tablet 3   blood glucose meter kit and supplies KIT Dispense based on patient and insurance preference. Use up to four times daily as directed. (FOR ICD-9 250.00, 250.01). 1 each 0   Cholecalciferol (VITAMIN D3) 2000 units TABS Take 2,000 Units by mouth daily.      citalopram (CELEXA) 20 MG tablet Take 1 tablet by mouth once daily 90 tablet 1   cyanocobalamin 500 MCG tablet Take 1,000 mcg by mouth daily.      Flaxseed, Linseed, (FLAXSEED OIL PO) Take 1,300 mg by mouth daily.     fluticasone (FLONASE) 50 MCG/ACT nasal spray Place 2 sprays into both nostrils daily. 16 g 6   glimepiride (AMARYL) 2 MG tablet TAKE 1 TABLET BY MOUTH ONCE DAILY BEFORE BREAKFAST 90 tablet 0   glucose blood (ONETOUCH VERIO) test strip  Onetouch Verio Check blood sugar twice daily 100 each 12   indapamide (LOZOL) 1.25 MG tablet Take 1.25 mg by mouth daily.     levothyroxine (EUTHYROX) 88 MCG tablet Take 1 tablet (88 mcg total) by mouth daily  before breakfast. 90 tablet 1   losartan (COZAAR) 50 MG tablet Take 1 tablet (50 mg total) by mouth daily. 90 tablet 1   meclizine (ANTIVERT) 25 MG tablet Take 1 tablet (25 mg total) by mouth 3 (three) times daily as needed for dizziness. 30 tablet 5   methocarbamol (ROBAXIN) 500 MG tablet Take 1 tablet (500 mg total) by mouth 4 (four) times daily. 45 tablet 1   Omega-3 Fatty Acids (FISH OIL) 1200 MG CAPS Take 1,200 mg by mouth daily.     ondansetron (ZOFRAN) 4 MG tablet Take 1 tablet (4 mg total) by mouth every 8 (eight) hours as needed for nausea or vomiting. 20 tablet 5   OneTouch Delica Lancets 14G MISC 1 Device by Does not apply route 2 (two) times daily as needed. 60 each 5   pantoprazole (PROTONIX) 40 MG tablet Take 1 tablet (40 mg total) by mouth daily. 90 tablet 3   propranolol (INDERAL) 20 MG tablet Take 2 tablets (40 mg total) by mouth 2 (two) times daily. 120 tablet 3   rosuvastatin (CRESTOR) 20 MG tablet Take 1 tablet (20 mg total) by mouth daily. 90 tablet 1   Semaglutide,0.25 or 0.5MG/DOS, (OZEMPIC, 0.25 OR 0.5 MG/DOSE,) 2 MG/1.5ML SOPN Inject 0.5 mg into the skin once a week. 1.5 mL 5   No facility-administered medications prior to visit.    Allergies  Allergen Reactions   Lisinopril Cough    Review of Systems  Constitutional:  Negative for chills, fever and malaise/fatigue.  HENT:  Negative for congestion and hearing loss.   Eyes:  Negative for discharge.  Respiratory:  Negative for cough, sputum production and shortness of breath.   Cardiovascular:  Negative for chest pain, palpitations and leg swelling.  Gastrointestinal:  Positive for diarrhea, nausea and vomiting. Negative for abdominal pain, blood in stool, constipation and heartburn.  Genitourinary:  Negative  for dysuria, frequency, hematuria and urgency.  Musculoskeletal:  Negative for back pain, falls and myalgias.  Skin:  Negative for rash.  Neurological:  Negative for dizziness, sensory change, loss of consciousness, weakness and headaches.  Endo/Heme/Allergies:  Negative for environmental allergies. Does not bruise/bleed easily.  Psychiatric/Behavioral:  Negative for depression and suicidal ideas. The patient is not nervous/anxious and does not have insomnia.   All other systems reviewed and are negative.     Objective:    Physical Exam Vitals and nursing note reviewed.  Constitutional:      General: She is not in acute distress.    Appearance: Normal appearance. She is not ill-appearing, toxic-appearing or diaphoretic.  Abdominal:     Tenderness: There is no abdominal tenderness.  Neurological:     Mental Status: She is alert.    There were no vitals taken for this visit. Wt Readings from Last 3 Encounters:  12/02/20 226 lb 3.2 oz (102.6 kg)  11/14/20 223 lb (101.2 kg)  10/13/20 223 lb (101.2 kg)    Diabetic Foot Exam - Simple   No data filed    Lab Results  Component Value Date   WBC 8.2 08/19/2020   HGB 13.7 08/19/2020   HCT 41.7 08/19/2020   PLT 320 08/19/2020   GLUCOSE 137 (H) 08/19/2020   CHOL 112 08/19/2020   TRIG 123 08/19/2020   HDL 38 (L) 08/19/2020   LDLCALC 54 08/19/2020   ALT 23 08/19/2020   AST 22 08/19/2020   NA 140 08/19/2020   K 3.7 08/19/2020   CL 101 08/19/2020   CREATININE 0.51 08/19/2020  BUN 9 08/19/2020   CO2 27 08/19/2020   TSH 0.85 08/19/2020   HGBA1C 9.8 (H) 08/19/2020   MICROALBUR 13.7 08/19/2020    Lab Results  Component Value Date   TSH 0.85 08/19/2020   Lab Results  Component Value Date   WBC 8.2 08/19/2020   HGB 13.7 08/19/2020   HCT 41.7 08/19/2020   MCV 88.3 08/19/2020   PLT 320 08/19/2020   Lab Results  Component Value Date   NA 140 08/19/2020   K 3.7 08/19/2020   CO2 27 08/19/2020   GLUCOSE 137 (H)  08/19/2020   BUN 9 08/19/2020   CREATININE 0.51 08/19/2020   BILITOT 0.6 08/19/2020   ALKPHOS 41 05/26/2019   AST 22 08/19/2020   ALT 23 08/19/2020   PROT 7.0 08/19/2020   ALBUMIN 4.2 05/26/2019   CALCIUM 9.4 08/19/2020   ANIONGAP 14 04/03/2019   GFR 166.70 05/26/2019   Lab Results  Component Value Date   CHOL 112 08/19/2020   Lab Results  Component Value Date   HDL 38 (L) 08/19/2020   Lab Results  Component Value Date   LDLCALC 54 08/19/2020   Lab Results  Component Value Date   TRIG 123 08/19/2020   Lab Results  Component Value Date   CHOLHDL 2.9 08/19/2020   Lab Results  Component Value Date   HGBA1C 9.8 (H) 08/19/2020       Assessment & Plan:   Problem List Items Addressed This Visit       Unprioritized   Gastroenteritis - Primary    ? Food poisoning--- she had left food from restaurant on counter for several hours before eating it and symptoms started 6-8 hours later  Symptoms almost completely resolve Call back if symptoms worsen          No orders of the defined types were placed in this encounter.   I discussed the assessment and treatment plan with the patient. The patient was provided an opportunity to ask questions and all were answered. The patient agreed with the plan and demonstrated an understanding of the instructions.   The patient was advised to call back or seek an in-person evaluation if the symptoms worsen or if the condition fails to improve as anticipated.  I provided 20 minutes of face-to-face time during this encounter.   [name], DO [place] [facility] [phone] (phone) [phone] (fax)  [place]

## 2020-12-06 NOTE — Assessment & Plan Note (Addendum)
?   Food poisoning--- she had left food from restaurant on counter for several hours before eating it and symptoms started 6-8 hours later  Symptoms almost completely resolve Call back if symptoms worsen

## 2020-12-16 ENCOUNTER — Ambulatory Visit (INDEPENDENT_AMBULATORY_CARE_PROVIDER_SITE_OTHER): Payer: Self-pay | Admitting: Family Medicine

## 2020-12-16 ENCOUNTER — Other Ambulatory Visit: Payer: Self-pay

## 2020-12-16 VITALS — BP 122/70 | HR 74 | Temp 98.0°F | Resp 18 | Ht 66.0 in | Wt 224.4 lb

## 2020-12-16 DIAGNOSIS — D229 Melanocytic nevi, unspecified: Secondary | ICD-10-CM

## 2020-12-16 DIAGNOSIS — L918 Other hypertrophic disorders of the skin: Secondary | ICD-10-CM

## 2020-12-16 NOTE — Progress Notes (Signed)
Patient ID: Felicia Solis, female    DOB: [birth year]  Age: 55 y.o. MRN: [id number]    Subjective:  Subjective  HPI Felicia Solis presents for an office visit today. She complains of moles on the back and L side of the neck. She notes that the moles has been snagging on things. The skin tags was removed today at the office. The larger of the two skin tag was sent for biopsy. She denies any chest pain, SOB, fever, abdominal pain, cough, chills, sore throat, dysuria, urinary incontinence, back pain, HA, or N/V/D at this time.   Review of Systems  Constitutional:  Negative for chills, fatigue and fever.  HENT:  Negative for ear pain, rhinorrhea, sinus pressure, sinus pain, sore throat and tinnitus.   Eyes:  Negative for pain.  Respiratory:  Negative for cough, shortness of breath and wheezing.   Cardiovascular:  Negative for chest pain.  Gastrointestinal:  Negative for abdominal pain, anal bleeding, constipation, diarrhea, nausea and vomiting.  Genitourinary:  Negative for flank pain.  Musculoskeletal:  Negative for back pain and neck pain.  Skin:  Negative for rash.       (+) mole on neck    Neurological:  Negative for seizures, weakness, light-headedness, numbness and headaches.   History Past Medical History:  Diagnosis Date   Anemia    anemia  when pregnant in 2002   Anxiety    Arthritis    "knees and back" (09/28/2016)   Asthma 5 yrs ago    told she had asthma.. does not remember doctors name.    Chronic lower back pain    Depression    takes citalopram   History of hiatal hernia    History of kidney stones    Hyperlipidemia    Hypothyroidism    LBBB (left bundle branch block)    rate-related; identified on stress test 03/21/16   Migraine    "weekly" (09/28/2016)   Positive TB test 2008   pt states she took 9 months of medicine for positive tb skin test   Shortness of breath    with exertion   Thyroid disease    Type II diabetes mellitus ([place]) dx'd 2016    She has a  past surgical history that includes Endometrial ablation; Tubal ligation; laparoscopy; Dilation and curettage of uterus (1999); Thyroidectomy (09/28/2011); Thyroidectomy (09/28/2016); Thyroidectomy (Left, 09/28/2016); and Extracorporeal shock wave lithotripsy (Right, 04/02/2019).   Her family history includes Breast cancer in her maternal aunt; Breast cancer (age of onset: 20) in her maternal grandmother; Colon cancer in her paternal grandfather; Colon polyps in her father; Diabetes in her brother, mother, and sister; Gallbladder disease in her maternal grandmother and mother; Heart attack in her brother; Heart disease in her mother and another family member; Hyperlipidemia in her father and mother; Hypertension in her father and mother; Ovarian cancer in her maternal grandmother.She reports that she has never smoked. She has never used smokeless tobacco. She reports that she does not drink alcohol and does not use drugs.  Current Outpatient Medications on File Prior to Visit  Medication Sig Dispense Refill   acetaminophen (TYLENOL) 500 MG tablet Take 1,000 mg by mouth every 6 (six) hours as needed for mild pain.     aspirin EC 81 MG tablet Take 1 tablet (81 mg total) by mouth daily. 90 tablet 3   blood glucose meter kit and supplies KIT Dispense based on patient and insurance preference. Use up to four times daily as directed. (FOR  ICD-9 250.00, 250.01). 1 each 0   Cholecalciferol (VITAMIN D3) 2000 units TABS Take 2,000 Units by mouth daily.      citalopram (CELEXA) 20 MG tablet Take 1 tablet by mouth once daily 90 tablet 1   cyanocobalamin 500 MCG tablet Take 1,000 mcg by mouth daily.      Flaxseed, Linseed, (FLAXSEED OIL PO) Take 1,300 mg by mouth daily.     fluticasone (FLONASE) 50 MCG/ACT nasal spray Place 2 sprays into both nostrils daily. 16 g 6   glimepiride (AMARYL) 2 MG tablet TAKE 1 TABLET BY MOUTH ONCE DAILY BEFORE BREAKFAST 90 tablet 0   glucose blood (ONETOUCH VERIO) test strip Onetouch Verio  Check blood sugar twice daily 100 each 12   indapamide (LOZOL) 1.25 MG tablet Take 1.25 mg by mouth daily.     levothyroxine (EUTHYROX) 88 MCG tablet Take 1 tablet (88 mcg total) by mouth daily before breakfast. 90 tablet 1   losartan (COZAAR) 50 MG tablet Take 1 tablet (50 mg total) by mouth daily. 90 tablet 1   meclizine (ANTIVERT) 25 MG tablet Take 1 tablet (25 mg total) by mouth 3 (three) times daily as needed for dizziness. 30 tablet 5   methocarbamol (ROBAXIN) 500 MG tablet Take 1 tablet (500 mg total) by mouth 4 (four) times daily. 45 tablet 1   Omega-3 Fatty Acids (FISH OIL) 1200 MG CAPS Take 1,200 mg by mouth daily.     ondansetron (ZOFRAN) 4 MG tablet Take 1 tablet (4 mg total) by mouth every 8 (eight) hours as needed for nausea or vomiting. 20 tablet 5   OneTouch Delica Lancets 68T MISC 1 Device by Does not apply route 2 (two) times daily as needed. 60 each 5   pantoprazole (PROTONIX) 40 MG tablet Take 1 tablet (40 mg total) by mouth daily. 90 tablet 3   propranolol (INDERAL) 20 MG tablet Take 2 tablets (40 mg total) by mouth 2 (two) times daily. 120 tablet 3   rosuvastatin (CRESTOR) 20 MG tablet Take 1 tablet (20 mg total) by mouth daily. 90 tablet 1   Semaglutide,0.25 or 0.5MG/DOS, (OZEMPIC, 0.25 OR 0.5 MG/DOSE,) 2 MG/1.5ML SOPN Inject 0.5 mg into the skin once a week. 1.5 mL 5   No current facility-administered medications on file prior to visit.     Objective:  Objective  Physical Exam Vitals and nursing note reviewed.  Constitutional:      General: She is not in acute distress.    Appearance: Normal appearance. She is well-developed. She is not ill-appearing.  HENT:     Head: Normocephalic and atraumatic.     Right Ear: External ear normal.     Left Ear: External ear normal.     Nose: Nose normal.  Eyes:     General:        Right eye: No discharge.        Left eye: No discharge.     Extraocular Movements: Extraocular movements intact.     Pupils: Pupils are equal,  round, and reactive to light.  Cardiovascular:     Rate and Rhythm: Normal rate and regular rhythm.     Pulses: Normal pulses.     Heart sounds: Normal heart sounds. No murmur heard.   No friction rub. No gallop.  Pulmonary:     Effort: Pulmonary effort is normal. No respiratory distress.     Breath sounds: Normal breath sounds. No stridor. No wheezing, rhonchi or rales.  Chest:     Chest  wall: No tenderness.  Abdominal:     General: Bowel sounds are normal. There is no distension.     Palpations: Abdomen is soft. There is no mass.     Tenderness: There is no abdominal tenderness. There is no guarding or rebound.     Hernia: No hernia is present.  Musculoskeletal:        General: Normal range of motion.     Cervical back: Normal range of motion and neck supple.     Right lower leg: No edema.     Left lower leg: No edema.  Skin:    General: Skin is warm and dry.     Comments: There is skin tags present in the posterior and anterior region of the neck.  Area cleaned with betadine and anesthetized with lidocaine < 1 cc Scissors and foreps used to remove with no complications  Tag sent for pathology  Silver nitrate used to stop bleeding and abx ointment and bandaid placed   Neurological:     Mental Status: She is alert and oriented to person, place, and time.  Psychiatric:        Behavior: Behavior normal.        Thought Content: Thought content normal.   BP 122/70 (BP Location: Left Arm, Patient Position: Sitting, Cuff Size: Large)   Pulse 74   Temp 98 F (36.7 C) (Oral)   Resp 18   Ht _0  (1.676 m)   Wt 224 lb 6.4 oz (101.8 kg)   SpO2 98%   BMI 36.22 kg/m  Wt Readings from Last 3 Encounters:  12/16/20 224 lb 6.4 oz (101.8 kg)  12/02/20 226 lb 3.2 oz (102.6 kg)  11/14/20 223 lb (101.2 kg)     Lab Results  Component Value Date   WBC 8.2 08/19/2020   HGB 13.7 08/19/2020   HCT 41.7 08/19/2020   PLT 320 08/19/2020   GLUCOSE 137 (H) 08/19/2020   CHOL 112 08/19/2020    TRIG 123 08/19/2020   HDL 38 (L) 08/19/2020   LDLCALC 54 08/19/2020   ALT 23 08/19/2020   AST 22 08/19/2020   NA 140 08/19/2020   K 3.7 08/19/2020   CL 101 08/19/2020   CREATININE 0.51 08/19/2020   BUN 9 08/19/2020   CO2 27 08/19/2020   TSH 0.85 08/19/2020   HGBA1C 9.8 (H) 08/19/2020   MICROALBUR 13.7 08/19/2020    MM DIAG BREAST TOMO BILATERAL  Result Date: 11/07/2020 CLINICAL DATA:  One year follow-up for probably benign calcifications in the RIGHT breast. EXAM: DIGITAL DIAGNOSTIC BILATERAL MAMMOGRAM WITH TOMOSYNTHESIS AND CAD TECHNIQUE: Bilateral digital diagnostic mammography and breast tomosynthesis was performed. The images were evaluated with computer-aided detection. COMPARISON:  Previous exam(s). ACR Breast Density Category a: The breast tissue is almost entirely fatty. FINDINGS: Magnified views are performed of calcifications in the UPPER OUTER QUADRANT of the RIGHT breast. Calcifications are coarse, with dystrophic appearance. No suspicious morphology or distribution of calcifications identified. No suspicious mass, distortion, or microcalcifications are identified to suggest presence of malignancy. IMPRESSION: Stable, probably benign RIGHT breast calcifications. No mammographic evidence for malignancy. RECOMMENDATION: Bilateral diagnostic mammogram is recommended in 1 year to complete follow-up. I have discussed the findings and recommendations with the patient. If applicable, a reminder letter will be sent to the patient regarding the next appointment. BI-RADS CATEGORY  3: Probably benign. Electronically Signed   By: Nolon Nations M.D.   On: 11/07/2020 16:35     Assessment & Plan:  Plan  No orders of the defined types were placed in this encounter.   Problem List Items Addressed This Visit       Unprioritized   Skin tag - Primary   Relevant Orders   Pathology  Keep dry 24 h    Follow-up: No follow-ups on file.   I,Gordon Zheng,acting as a Education administrator for [employer], DO.,have documented all relevant documentation on the behalf of Felicia Held, DO,as directed by  Felicia Held, DO while in the presence of [place], DO, have reviewed all documentation for this visit. The documentation on 12/18/20 for the exam, diagnosis, procedures, and orders are all accurate and complete.

## 2020-12-16 NOTE — Patient Instructions (Signed)
Skin Tag, Adult  A skin tag (acrochordon) is a soft, extra growth of skin. Most skin tags are skin-colored and rarely bigger than a pencil eraser. They commonly form in areas where there is frequent rubbing, or friction, on the skin. This may be where there are folds in the skin, such as the eyelids, neck, armpit, or groin. Skin tags are not dangerous, and they do not spread from person to person (are not contagious). You may have one skin tag or several. Skin tags do not require treatment. However, your health care provider may recommend removal of a skin tag if it: Gets irritated from clothing or jewelry. Bleeds. Is visible and unsightly. What are the causes? This condition is linked with: Increasing age. Pregnancy. Diabetes. Obesity. What are the signs or symptoms? Skin tags usually do not cause symptoms unless they get irritated by items touching your skin, such as clothing or jewelry. When this happens, you may have pain, itching, or bleeding. How is this diagnosed? This condition is diagnosed with an evaluation from your health care provider. No testing is needed for diagnosis. How is this treated? Treatment for this condition depends on whether you have symptoms. If a skin tag needs to be removed, your health care provider can remove it with: A simple surgical procedure using scissors. A procedure that involves freezing your skin tag with a gas in liquid form (liquid nitrogen). A procedure that uses heat to destroy your skin tag (electrodessication). Your health care provider may also remove your skin tag if it is visible or unsightly, Follow these instructions at home: Watch for any changes in your skin tag. A normal skin tag does not require any other special care at home. Take over-the-counter and prescription medicines only as told by your health care provider. Keep all follow-up visits as told by your health care provider. This is important. Contact a health care provider  if: You have a skin tag that: Becomes painful. Changes color. Bleeds. Swells. Summary Skin tags are soft, extra growths of skin found in areas of frequent rubbing or friction. Skin tags usually do not cause symptoms. If symptoms occur, you may have pain, itching, or bleeding. If your skin tag causes symptoms or is unsightly, your health care provider can remove it. This information is not intended to replace advice given to you by your health care provider. Make sure you discuss any questions you have with your health care provider. Document Revised: 04/13/2019 Document Reviewed: 04/13/2019 Elsevier Patient Education  2022 [employer]  

## 2020-12-18 ENCOUNTER — Encounter: Payer: Self-pay | Admitting: Family Medicine

## 2020-12-18 DIAGNOSIS — L918 Other hypertrophic disorders of the skin: Secondary | ICD-10-CM | POA: Insufficient documentation

## 2020-12-19 LAB — PATHOLOGY REPORT

## 2020-12-19 LAB — TISSUE SPECIMEN

## 2020-12-22 ENCOUNTER — Other Ambulatory Visit: Payer: Self-pay | Admitting: Family Medicine

## 2020-12-22 DIAGNOSIS — F411 Generalized anxiety disorder: Secondary | ICD-10-CM

## 2021-01-02 ENCOUNTER — Ambulatory Visit: Payer: No Typology Code available for payment source | Admitting: Family Medicine

## 2021-01-20 ENCOUNTER — Encounter: Payer: Self-pay | Admitting: Family Medicine

## 2021-01-20 ENCOUNTER — Telehealth (INDEPENDENT_AMBULATORY_CARE_PROVIDER_SITE_OTHER): Payer: Self-pay | Admitting: Family Medicine

## 2021-01-20 ENCOUNTER — Other Ambulatory Visit: Payer: Self-pay

## 2021-01-20 ENCOUNTER — Ambulatory Visit: Payer: Self-pay | Admitting: Neurology

## 2021-01-20 DIAGNOSIS — R059 Cough, unspecified: Secondary | ICD-10-CM

## 2021-01-20 DIAGNOSIS — J0140 Acute pansinusitis, unspecified: Secondary | ICD-10-CM

## 2021-01-20 MED ORDER — PROMETHAZINE-DM 6.25-15 MG/5ML PO SYRP: 5.0000 mL | ORAL_SOLUTION | Freq: Four times a day (QID) | ORAL | 0 refills | Status: DC | PRN

## 2021-01-20 MED ORDER — AMOXICILLIN-POT CLAVULANATE 875-125 MG PO TABS: 1.0000 | ORAL_TABLET | Freq: Two times a day (BID) | ORAL | 0 refills | Status: DC

## 2021-01-20 NOTE — Progress Notes (Signed)
MyChart Video Visit    Virtual Visit via Video Note   This visit type was conducted due to national recommendations for restrictions regarding the COVID-19 Pandemic (e.g. social distancing) in an effort to limit this patient's exposure and mitigate transmission in our community. This patient is at least at moderate risk for complications without adequate follow up. This format is felt to be most appropriate for this patient at this time. Physical exam was limited by quality of the video and audio technology used for the visit. Felicia Solis  was able to get the patient set up on a video visit.  Patient location: Home Patient and provider in visit Provider location: Office  I discussed the limitations of evaluation and management by telemedicine and the availability of in person appointments. The patient expressed understanding and agreed to proceed.  Visit Date: 01/20/2021  Today's healthcare provider: [name], DO     Subjective:    Patient ID: Felicia Solis, female    DOB: 08/05/1965, 55 y.o.   MRN: [id number]  Chief Complaint  Patient presents with   Cough    Complains of coughing and sneezing, tested negative for covid on 01-18-21.   Nasal Congestion    Complains of nasal congestion    HPI Patient is in today for c/o cough with yellow mucus and sinus pressure---- 01/18/2021 neg covid---  pt was working with a pt that was + for covid on 7/27---  pt did use N95 and washed hands.   Pt grand daughter tested +RSV last week   no fever  Past Medical History:  Diagnosis Date   Anemia    anemia  when pregnant in 2002   Anxiety    Arthritis    "knees and back" (09/28/2016)   Asthma 5 yrs ago    told she had asthma.. does not remember doctors name.    Chronic lower back pain    Depression    takes citalopram   History of hiatal hernia    History of kidney stones    Hyperlipidemia    Hypothyroidism    LBBB (left bundle branch block)    rate-related; identified on  stress test 03/21/16   Migraine    "weekly" (09/28/2016)   Positive TB test 2008   pt states she took 9 months of medicine for positive tb skin test   Shortness of breath    with exertion   Thyroid disease    Type II diabetes mellitus ([place]) dx'd 2016    Past Surgical History:  Procedure Laterality Date   DILATION AND CURETTAGE OF UTERUS  1999   ENDOMETRIAL ABLATION     EXTRACORPOREAL SHOCK WAVE LITHOTRIPSY Right 04/02/2019   Procedure: EXTRACORPOREAL SHOCK WAVE LITHOTRIPSY (ESWL);  Surgeon: Ceasar Mons, MD;  Location: [facility];  Service: Urology;  Laterality: Right;   LAPAROSCOPY     years ago ..pt does not know where surgery was done ... states" my stomach would blow up then go down"   THYROIDECTOMY  09/28/2011   Procedure: THYROIDECTOMY;  Surgeon: Izora Gala, MD;  Location: [facility];  Service: ENT;  Laterality: Right;  RIGHT THYROIDECTOMY WITH FROZEN SECTION   THYROIDECTOMY  09/28/2016   completion of thyroidectomy/notes 09/28/2016   THYROIDECTOMY Left 09/28/2016   Procedure: COMPLETION OF THYROIDECTOMY;  Surgeon: Izora Gala, MD;  Location: [facility];  Service: ENT;  Laterality: Left;   TUBAL LIGATION      Family History  Problem Relation Age of Onset  Diabetes Mother    Hypertension Mother    Hyperlipidemia Mother    Gallbladder disease Mother    Heart disease Mother    Diabetes Sister    Hypertension Father    Hyperlipidemia Father    Colon polyps Father    Ovarian cancer Maternal Grandmother    Gallbladder disease Maternal Grandmother    Breast cancer Maternal Grandmother 28   Colon cancer Paternal Grandfather    Breast cancer Maternal Aunt        late 31s   Heart disease Other        early cad   Diabetes Brother    Heart attack Brother    Anesthesia problems Neg Hx    Hypotension Neg Hx    Malignant hyperthermia Neg Hx    Pseudochol deficiency Neg Hx    Esophageal cancer Neg Hx     Social History   Socioeconomic History   Marital status: Married     Spouse name: Not on file   Number of children: 3   Years of education: Not on file   Highest education level: Not on file  Occupational History   Occupation: CNA  Tobacco Use   Smoking status: Never   Smokeless tobacco: Never  Vaping Use   Vaping Use: Never used  Substance and Sexual Activity   Alcohol use: No    Alcohol/week: 0.0 standard drinks   Drug use: No   Sexual activity: Not on file  Other Topics Concern   Not on file  Social History Narrative   No exercise--  Walking and lifting pts at work   Right handed   Lives in single story home   Social Determinants of Health   Financial Resource Strain: Not on file  Food Insecurity: Not on file  Transportation Needs: Not on file  Physical Activity: Not on file  Stress: Not on file  Social Connections: Not on file  Intimate Partner Violence: Not on file    Outpatient Medications Prior to Visit  Medication Sig Dispense Refill   acetaminophen (TYLENOL) 500 MG tablet Take 1,000 mg by mouth every 6 (six) hours as needed for mild pain.     aspirin EC 81 MG tablet Take 1 tablet (81 mg total) by mouth daily. 90 tablet 3   blood glucose meter kit and supplies KIT Dispense based on patient and insurance preference. Use up to four times daily as directed. (FOR ICD-9 250.00, 250.01). 1 each 0   Cholecalciferol (VITAMIN D3) 2000 units TABS Take 2,000 Units by mouth daily.      citalopram (CELEXA) 20 MG tablet Take 1 tablet by mouth once daily 90 tablet 0   cyanocobalamin 500 MCG tablet Take 1,000 mcg by mouth daily.      Flaxseed, Linseed, (FLAXSEED OIL PO) Take 1,300 mg by mouth daily.     fluticasone (FLONASE) 50 MCG/ACT nasal spray Place 2 sprays into both nostrils daily. 16 g 6   glimepiride (AMARYL) 2 MG tablet TAKE 1 TABLET BY MOUTH ONCE DAILY BEFORE BREAKFAST 90 tablet 0   glucose blood (ONETOUCH VERIO) test strip Onetouch Verio Check blood sugar twice daily 100 each 12   indapamide (LOZOL) 1.25 MG tablet Take 1.25 mg by mouth  daily.     levothyroxine (EUTHYROX) 88 MCG tablet Take 1 tablet (88 mcg total) by mouth daily before breakfast. 90 tablet 1   losartan (COZAAR) 50 MG tablet Take 1 tablet (50 mg total) by mouth daily. 90 tablet 1   meclizine (  ANTIVERT) 25 MG tablet Take 1 tablet (25 mg total) by mouth 3 (three) times daily as needed for dizziness. 30 tablet 5   methocarbamol (ROBAXIN) 500 MG tablet Take 1 tablet (500 mg total) by mouth 4 (four) times daily. 45 tablet 1   Omega-3 Fatty Acids (FISH OIL) 1200 MG CAPS Take 1,200 mg by mouth daily.     ondansetron (ZOFRAN) 4 MG tablet Take 1 tablet (4 mg total) by mouth every 8 (eight) hours as needed for nausea or vomiting. 20 tablet 5   OneTouch Delica Lancets 07K MISC 1 Device by Does not apply route 2 (two) times daily as needed. 60 each 5   pantoprazole (PROTONIX) 40 MG tablet Take 1 tablet (40 mg total) by mouth daily. 90 tablet 3   propranolol (INDERAL) 20 MG tablet Take 2 tablets (40 mg total) by mouth 2 (two) times daily. 120 tablet 3   rosuvastatin (CRESTOR) 20 MG tablet Take 1 tablet (20 mg total) by mouth daily. 90 tablet 1   Semaglutide,0.25 or 0.5MG /DOS, (OZEMPIC, 0.25 OR 0.5 MG/DOSE,) 2 MG/1.5ML SOPN Inject 0.5 mg into the skin once a week. 1.5 mL 5   No facility-administered medications prior to visit.    Allergies  Allergen Reactions   Lisinopril Cough    Review of Systems  Constitutional:  Negative for weight loss.  HENT:  Positive for congestion.   Eyes:  Negative for photophobia, pain, discharge and redness.  Respiratory:  Positive for cough.   Cardiovascular:  Negative for palpitations.  Gastrointestinal:  Negative for blood in stool, constipation and melena.  Genitourinary:  Negative for frequency, hematuria and urgency.  Musculoskeletal:  Negative for falls.  Skin:  Negative for itching and rash.  Neurological:  Negative for tremors, sensory change, speech change and focal weakness.  Psychiatric/Behavioral:  Negative for depression,  hallucinations, substance abuse and suicidal ideas.       Objective:    Physical Exam Vitals and nursing note reviewed.  Constitutional:      Appearance: Normal appearance.  Neurological:     Mental Status: She is alert.  Psychiatric:        Behavior: Behavior normal.        Thought Content: Thought content normal.    There were no vitals taken for this visit. Wt Readings from Last 3 Encounters:  12/16/20 224 lb 6.4 oz (101.8 kg)  12/02/20 226 lb 3.2 oz (102.6 kg)  11/14/20 223 lb (101.2 kg)    Diabetic Foot Exam - Simple   No data filed    Lab Results  Component Value Date   WBC 8.2 08/19/2020   HGB 13.7 08/19/2020   HCT 41.7 08/19/2020   PLT 320 08/19/2020   GLUCOSE 137 (H) 08/19/2020   CHOL 112 08/19/2020   TRIG 123 08/19/2020   HDL 38 (L) 08/19/2020   LDLCALC 54 08/19/2020   ALT 23 08/19/2020   AST 22 08/19/2020   NA 140 08/19/2020   K 3.7 08/19/2020   CL 101 08/19/2020   CREATININE 0.51 08/19/2020   BUN 9 08/19/2020   CO2 27 08/19/2020   TSH 0.85 08/19/2020   HGBA1C 9.8 (H) 08/19/2020   MICROALBUR 13.7 08/19/2020    Lab Results  Component Value Date   TSH 0.85 08/19/2020   Lab Results  Component Value Date   WBC 8.2 08/19/2020   HGB 13.7 08/19/2020   HCT 41.7 08/19/2020   MCV 88.3 08/19/2020   PLT 320 08/19/2020   Lab Results  Component  Value Date   NA 140 08/19/2020   K 3.7 08/19/2020   CO2 27 08/19/2020   GLUCOSE 137 (H) 08/19/2020   BUN 9 08/19/2020   CREATININE 0.51 08/19/2020   BILITOT 0.6 08/19/2020   ALKPHOS 41 05/26/2019   AST 22 08/19/2020   ALT 23 08/19/2020   PROT 7.0 08/19/2020   ALBUMIN 4.2 05/26/2019   CALCIUM 9.4 08/19/2020   ANIONGAP 14 04/03/2019   GFR 166.70 05/26/2019   Lab Results  Component Value Date   CHOL 112 08/19/2020   Lab Results  Component Value Date   HDL 38 (L) 08/19/2020   Lab Results  Component Value Date   LDLCALC 54 08/19/2020   Lab Results  Component Value Date   TRIG 123 08/19/2020    Lab Results  Component Value Date   CHOLHDL 2.9 08/19/2020   Lab Results  Component Value Date   HGBA1C 9.8 (H) 08/19/2020       Assessment & Plan:   Problem List Items Addressed This Visit       Unprioritized   Cough   Relevant Medications   promethazine-dextromethorphan (PROMETHAZINE-DM) 6.25-15 MG/5ML syrup   Pansinusitis - Primary    abx and cough med per orders Pt will do another covid test  Use flonase  rto prn        Relevant Medications   amoxicillin-clavulanate (AUGMENTIN) 875-125 MG tablet   promethazine-dextromethorphan (PROMETHAZINE-DM) 6.25-15 MG/5ML syrup     Meds ordered this encounter  Medications   amoxicillin-clavulanate (AUGMENTIN) 875-125 MG tablet    Sig: Take 1 tablet by mouth 2 (two) times daily.    Dispense:  20 tablet    Refill:  0   promethazine-dextromethorphan (PROMETHAZINE-DM) 6.25-15 MG/5ML syrup    Sig: Take 5 mLs by mouth 4 (four) times daily as needed.    Dispense:  118 mL    Refill:  0    I discussed the assessment and treatment plan with the patient. The patient was provided an opportunity to ask questions and all were answered. The patient agreed with the plan and demonstrated an understanding of the instructions.   The patient was advised to call back or seek an in-person evaluation if the symptoms worsen or if the condition fails to improve as anticipated.    [name], DO [place] [facility] [phone] (phone) [phone] (fax)  [place]

## 2021-01-20 NOTE — Assessment & Plan Note (Signed)
abx and cough med per orders Pt will do another covid test  Use flonase  rto prn

## 2021-01-24 ENCOUNTER — Telehealth: Payer: Self-pay

## 2021-01-24 NOTE — Telephone Encounter (Addendum)
Form signed and faxed to [facility].  Patient notified faxed sent.  Should receive in 10-14 business days on around 02/09/21

## 2021-01-24 NOTE — Telephone Encounter (Signed)
Form given to provider to sign

## 2021-01-24 NOTE — Telephone Encounter (Signed)
Patient called stating she in on the patient assistance program for Ozempic and she is about 2 weeks away from being out of medication and she stated she was to make you aware of this.

## 2021-01-30 NOTE — Telephone Encounter (Signed)
Left message on machine that Ozempic is here ready for pickup.

## 2021-02-16 ENCOUNTER — Ambulatory Visit: Payer: Self-pay | Admitting: Family Medicine

## 2021-02-16 NOTE — Progress Notes (Incomplete)
Subjective:   By signing my name below, I, Shehryar Baig, attest that this documentation has been prepared under the direction and in the presence of Dr. Seabron Spates, DO. 02/16/2021     Patient ID: Felicia Solis, female    DOB: 30-Nov-1965, 55 y.o.   MRN: [id number]  No chief complaint on file.   HPI Patient is in today for a office visit.   Past Medical History:  Diagnosis Date   Anemia    anemia  when pregnant in 2002   Anxiety    Arthritis    "knees and back" (09/28/2016)   Asthma 5 yrs ago    told she had asthma.. does not remember doctors name.    Chronic lower back pain    Depression    takes citalopram   History of hiatal hernia    History of kidney stones    Hyperlipidemia    Hypothyroidism    LBBB (left bundle branch block)    rate-related; identified on stress test 03/21/16   Migraine    "weekly" (09/28/2016)   Positive TB test 2008   pt states she took 9 months of medicine for positive tb skin test   Shortness of breath    with exertion   Thyroid disease    Type II diabetes mellitus ([facility]) dx'd 2016    Past Surgical History:  Procedure Laterality Date   DILATION AND CURETTAGE OF UTERUS  1999   ENDOMETRIAL ABLATION     EXTRACORPOREAL SHOCK WAVE LITHOTRIPSY Right 04/02/2019   Procedure: EXTRACORPOREAL SHOCK WAVE LITHOTRIPSY (ESWL);  Surgeon: Rene Paci, MD;  Location: [facility];  Service: Urology;  Laterality: Right;   LAPAROSCOPY     years ago ..pt does not know where surgery was done ... states" my stomach would blow up then go down"   THYROIDECTOMY  09/28/2011   Procedure: THYROIDECTOMY;  Surgeon: [name], MD;  [facility];  Service: ENT;  Laterality: Right;  RIGHT THYROIDECTOMY WITH FROZEN SECTION   THYROIDECTOMY  09/28/2016   completion of thyroidectomy/notes 09/28/2016   THYROIDECTOMY Left 09/28/2016   Procedure: COMPLETION OF THYROIDECTOMY;  Surgeon: [name], MD;  Location: [facility];  Service: ENT;  Laterality: Left;   TUBAL  LIGATION      Family History  Problem Relation Age of Onset   Diabetes Mother    Hypertension Mother    Hyperlipidemia Mother    Gallbladder disease Mother    Heart disease Mother    Diabetes Sister    Hypertension Father    Hyperlipidemia Father    Colon polyps Father    Ovarian cancer Maternal Grandmother    Gallbladder disease Maternal Grandmother    Breast cancer Maternal Grandmother 68   Colon cancer Paternal Grandfather    Breast cancer Maternal Aunt        late 33s   Heart disease Other        early cad   Diabetes Brother    Heart attack Brother    Anesthesia problems Neg Hx    Hypotension Neg Hx    Malignant hyperthermia Neg Hx    Pseudochol deficiency Neg Hx    Esophageal cancer Neg Hx     Social History   Socioeconomic History   Marital status: Married    Spouse name: Not on file   Number of children: 3   Years of education: Not on file   Highest education level: Not on file  Occupational History   Occupation: CNA  Tobacco Use   Smoking status: Never   Smokeless tobacco: Never  Vaping Use   Vaping Use: Never used  Substance and Sexual Activity   Alcohol use: No    Alcohol/week: 0.0 standard drinks   Drug use: No   Sexual activity: Not on file  Other Topics Concern   Not on file  Social History Narrative   No exercise--  Walking and lifting pts at work   Right handed   Lives in single story home   Social Determinants of Health   Financial Resource Strain: Not on file  Food Insecurity: Not on file  Transportation Needs: Not on file  Physical Activity: Not on file  Stress: Not on file  Social Connections: Not on file  Intimate Partner Violence: Not on file    Outpatient Medications Prior to Visit  Medication Sig Dispense Refill   acetaminophen (TYLENOL) 500 MG tablet Take 1,000 mg by mouth every 6 (six) hours as needed for mild pain.     amoxicillin-clavulanate (AUGMENTIN) 875-125 MG tablet Take 1 tablet by mouth 2 (two) times daily. 20  tablet 0   aspirin EC 81 MG tablet Take 1 tablet (81 mg total) by mouth daily. 90 tablet 3   blood glucose meter kit and supplies KIT Dispense based on patient and insurance preference. Use up to four times daily as directed. (FOR ICD-9 250.00, 250.01). 1 each 0   Cholecalciferol (VITAMIN D3) 2000 units TABS Take 2,000 Units by mouth daily.      citalopram (CELEXA) 20 MG tablet Take 1 tablet by mouth once daily 90 tablet 0   cyanocobalamin 500 MCG tablet Take 1,000 mcg by mouth daily.      Flaxseed, Linseed, (FLAXSEED OIL PO) Take 1,300 mg by mouth daily.     fluticasone (FLONASE) 50 MCG/ACT nasal spray Place 2 sprays into both nostrils daily. 16 g 6   glimepiride (AMARYL) 2 MG tablet TAKE 1 TABLET BY MOUTH ONCE DAILY BEFORE BREAKFAST 90 tablet 0   glucose blood (ONETOUCH VERIO) test strip Onetouch Verio Check blood sugar twice daily 100 each 12   indapamide (LOZOL) 1.25 MG tablet Take 1.25 mg by mouth daily.     levothyroxine (EUTHYROX) 88 MCG tablet Take 1 tablet (88 mcg total) by mouth daily before breakfast. 90 tablet 1   losartan (COZAAR) 50 MG tablet Take 1 tablet (50 mg total) by mouth daily. 90 tablet 1   meclizine (ANTIVERT) 25 MG tablet Take 1 tablet (25 mg total) by mouth 3 (three) times daily as needed for dizziness. 30 tablet 5   methocarbamol (ROBAXIN) 500 MG tablet Take 1 tablet (500 mg total) by mouth 4 (four) times daily. 45 tablet 1   Omega-3 Fatty Acids (FISH OIL) 1200 MG CAPS Take 1,200 mg by mouth daily.     ondansetron (ZOFRAN) 4 MG tablet Take 1 tablet (4 mg total) by mouth every 8 (eight) hours as needed for nausea or vomiting. 20 tablet 5   OneTouch Delica Lancets 84Y MISC 1 Device by Does not apply route 2 (two) times daily as needed. 60 each 5   pantoprazole (PROTONIX) 40 MG tablet Take 1 tablet (40 mg total) by mouth daily. 90 tablet 3   promethazine-dextromethorphan (PROMETHAZINE-DM) 6.25-15 MG/5ML syrup Take 5 mLs by mouth 4 (four) times daily as needed. 118 mL 0    propranolol (INDERAL) 20 MG tablet Take 2 tablets (40 mg total) by mouth 2 (two) times daily. 120 tablet 3   rosuvastatin (CRESTOR) 20  MG tablet Take 1 tablet (20 mg total) by mouth daily. 90 tablet 1   Semaglutide,0.25 or 0.5MG /DOS, (OZEMPIC, 0.25 OR 0.5 MG/DOSE,) 2 MG/1.5ML SOPN Inject 0.5 mg into the skin once a week. 1.5 mL 5   No facility-administered medications prior to visit.    Allergies  Allergen Reactions   Lisinopril Cough    ROS     Objective:    Physical Exam Constitutional:      General: She is not in acute distress.    Appearance: Normal appearance. She is not ill-appearing.  HENT:     Head: Normocephalic and atraumatic.     Right Ear: External ear normal.     Left Ear: External ear normal.  Eyes:     Extraocular Movements: Extraocular movements intact.     Pupils: Pupils are equal, round, and reactive to light.  Cardiovascular:     Rate and Rhythm: Normal rate and regular rhythm.     Heart sounds: Normal heart sounds. No murmur heard.   No gallop.  Pulmonary:     Effort: Pulmonary effort is normal. No respiratory distress.     Breath sounds: Normal breath sounds. No wheezing or rales.  Skin:    General: Skin is warm and dry.  Neurological:     Mental Status: She is alert and oriented to person, place, and time.  Psychiatric:        Behavior: Behavior normal.        Judgment: Judgment normal.    There were no vitals taken for this visit. Wt Readings from Last 3 Encounters:  12/16/20 224 lb 6.4 oz (101.8 kg)  12/02/20 226 lb 3.2 oz (102.6 kg)  11/14/20 223 lb (101.2 kg)    Diabetic Foot Exam - Simple   No data filed    Lab Results  Component Value Date   WBC 8.2 08/19/2020   HGB 13.7 08/19/2020   HCT 41.7 08/19/2020   PLT 320 08/19/2020   GLUCOSE 137 (H) 08/19/2020   CHOL 112 08/19/2020   TRIG 123 08/19/2020   HDL 38 (L) 08/19/2020   LDLCALC 54 08/19/2020   ALT 23 08/19/2020   AST 22 08/19/2020   NA 140 08/19/2020   K 3.7 08/19/2020    CL 101 08/19/2020   CREATININE 0.51 08/19/2020   BUN 9 08/19/2020   CO2 27 08/19/2020   TSH 0.85 08/19/2020   HGBA1C 9.8 (H) 08/19/2020   MICROALBUR 13.7 08/19/2020    Lab Results  Component Value Date   TSH 0.85 08/19/2020   Lab Results  Component Value Date   WBC 8.2 08/19/2020   HGB 13.7 08/19/2020   HCT 41.7 08/19/2020   MCV 88.3 08/19/2020   PLT 320 08/19/2020   Lab Results  Component Value Date   NA 140 08/19/2020   K 3.7 08/19/2020   CO2 27 08/19/2020   GLUCOSE 137 (H) 08/19/2020   BUN 9 08/19/2020   CREATININE 0.51 08/19/2020   BILITOT 0.6 08/19/2020   ALKPHOS 41 05/26/2019   AST 22 08/19/2020   ALT 23 08/19/2020   PROT 7.0 08/19/2020   ALBUMIN 4.2 05/26/2019   CALCIUM 9.4 08/19/2020   ANIONGAP 14 04/03/2019   GFR 166.70 05/26/2019   Lab Results  Component Value Date   CHOL 112 08/19/2020   Lab Results  Component Value Date   HDL 38 (L) 08/19/2020   Lab Results  Component Value Date   LDLCALC 54 08/19/2020   Lab Results  Component Value Date  TRIG 123 08/19/2020   Lab Results  Component Value Date   CHOLHDL 2.9 08/19/2020   Lab Results  Component Value Date   HGBA1C 9.8 (H) 08/19/2020       Assessment & Plan:   Problem List Items Addressed This Visit   None    No orders of the defined types were placed in this encounter.   I, Dr. Roma Schanz, DO, personally preformed the services described in this documentation.  All medical record entries made by the scribe were at my direction and in my presence.  I have reviewed the chart and discharge instructions (if applicable) and agree that the record reflects my personal performance and is accurate and complete. 02/16/2021   I,Shehryar Baig,acting as a scribe for [name], DO.,have documented all relevant documentation on the behalf of [name], DO,as directed by  [name], DO while in the presence of [name], DO.   Shehryar  [employer]

## 2021-03-23 ENCOUNTER — Other Ambulatory Visit: Payer: Self-pay | Admitting: Family Medicine

## 2021-03-23 ENCOUNTER — Telehealth: Payer: Self-pay | Admitting: Family Medicine

## 2021-03-23 MED ORDER — INDAPAMIDE 1.25 MG PO TABS: 1.2500 mg | ORAL_TABLET | Freq: Every day | ORAL | 3 refills | Status: DC

## 2021-03-23 NOTE — Telephone Encounter (Signed)
I do not see where you have written this for her before.  Do you want to refill?

## 2021-03-23 NOTE — Telephone Encounter (Signed)
Medication: indapamide (LOZOL) 1.25 MG tablet  Has the patient contacted their pharmacy? Yes.   (If no, request that the patient contact the pharmacy for the refill.) (If yes, when and what did the pharmacy advise?)  Preferred Pharmacy (with phone number or street name):  [facility], [address] [place], [place] [address]  Phone:  [phone]  Fax:  [phone]  Agent: Please be advised that RX refills may take up to 3 business days. We ask that you follow-up with your pharmacy.

## 2021-03-24 NOTE — Telephone Encounter (Signed)
Patient notified that rx has been sent in. 

## 2021-03-29 ENCOUNTER — Encounter: Payer: Self-pay | Admitting: Neurology

## 2021-04-23 ENCOUNTER — Other Ambulatory Visit: Payer: Self-pay | Admitting: Family Medicine

## 2021-04-23 DIAGNOSIS — F411 Generalized anxiety disorder: Secondary | ICD-10-CM

## 2021-05-01 ENCOUNTER — Ambulatory Visit: Payer: Self-pay | Admitting: Physician Assistant

## 2021-05-02 ENCOUNTER — Other Ambulatory Visit: Payer: Self-pay | Admitting: Family Medicine

## 2021-05-03 ENCOUNTER — Telehealth: Payer: Self-pay | Admitting: Family Medicine

## 2021-05-03 NOTE — Telephone Encounter (Signed)
[facility]: [phone]  Called and needs permission to switch medication below to different manufacture.   levothyroxine (SYNTHROID) 88 MCG tablet

## 2021-05-03 NOTE — Telephone Encounter (Signed)
Please advise if ok ?

## 2021-05-04 NOTE — Telephone Encounter (Signed)
LM for pt to return call to discuss.  

## 2021-05-05 NOTE — Telephone Encounter (Signed)
Pharmacy stated pt approved change with them, and they have switched her to new manufacturer. Please advise.

## 2021-05-08 NOTE — Telephone Encounter (Signed)
Please read below...

## 2021-05-11 ENCOUNTER — Ambulatory Visit (INDEPENDENT_AMBULATORY_CARE_PROVIDER_SITE_OTHER): Payer: Self-pay | Admitting: Physician Assistant

## 2021-05-11 ENCOUNTER — Other Ambulatory Visit: Payer: Self-pay

## 2021-05-11 ENCOUNTER — Encounter: Payer: Self-pay | Admitting: Physician Assistant

## 2021-05-11 VITALS — BP 111/77 | HR 82 | Resp 18 | Ht 66.0 in | Wt 228.0 lb

## 2021-05-11 DIAGNOSIS — G43009 Migraine without aura, not intractable, without status migrainosus: Secondary | ICD-10-CM

## 2021-05-11 DIAGNOSIS — R42 Dizziness and giddiness: Secondary | ICD-10-CM

## 2021-05-11 MED ORDER — PROPRANOLOL HCL 20 MG PO TABS: 20.0000 mg | ORAL_TABLET | Freq: Two times a day (BID) | ORAL | 3 refills | Status: DC

## 2021-05-11 NOTE — Progress Notes (Signed)
NEUROLOGY FOLLOW UP OFFICE NOTE  Felicia Solis [id number]  Assessment/Plan:   1.  Migraine without aura, without status migrainosus, not intractable 2.  Vertigo, possibly migraine-associated.  She reports that her right ear sometimes feels clogged, raising possibility of eustachian tube dysfunction   For migraine prevention: Start propranolol 20 mg BID per patient request, she does not wish to start with 40 mg twice daily.  If no improvement in 4 weeks, we can increase dose.  If experiences lightheadedness, would discontinue For migraine rescue:  May use Excedrin Migraine.  Limit use of pain relievers to no more than 2 days out of week to prevent risk of rebound or medication-overuse headache. For vertigo spells:  May use meclizine if absolutely needed (limit use) and Zofran for nausea                                       Referral to vestibular therapy Follow up in 6 months.    Subjective:  Felicia Starry. Solis is a 55 year old female who follows up for dizziness and headache.   UPDATE: Last seen on 12/02/2020 .  At the time, she was started on propanolol 40 mg twice daily and she never picked it up.  She stated that it may be too much and that she would does not want to take that much of a dose.  Headaches are moderate, lasting all day, occurring 2 to 3 days a week, with infrequent dizzy spells, and occasional vomiting, last 1 week ago.    They have been occurring at least 3 times a month but last one was a month ago.  It is more severe and now vomiting.  They can last up to 15 minutes.  Sometimes her right ear feels stuffed up.  She had an MRI of brain without contrast on 09/09/2020 which was personally reviewed and showed mild to moderate nonspecific changes in the cerebral white matter, likely chronic small vessel ischemic changes or sequela of migraine.     Current NSAIDs:  ASA 50m daily Current analgesics:  Tylenol; Excedrin Migraine (hasn't been able to find in the store, which works  better than Tylenol).2x/week  Current muscle relaxant:  Robaxin Current antihypertensive:  Losartan Current antidepressant:  Citalopram 269mCurrent antihistamine/decongestants:  Astelin; Flonase   HISTORY: In December 2020, she began experiencing dizziness described as spinning sensation.  It first occurred upon getting up after she was bent over in the kitchen to clean the floor.  However, it occurs spontaneously as well.  It would last a couple of minutes.  No visual disturbance, nausea, vomiting, photophobia, phonophobia, weakness, slurred speech or headache.  She also felt that her left ear was clogged up and would pop.  She initially went to her PCP who found cerumen impaction which was removed and she was prescribed seasonal allergies and amoxicillin for possible otitis media.  However, she continued to have these spells.  She was evaluated by ENT, Dr. RoConstance Holster Her exam was unremarkable and she reported no recent episodes.  She was advised to follow up if symptoms return.  However, the vertigo returned.  She also started developing aural fullness in her right ear as well (like she was on an airplane) associated with muffled hearing.  No tinnitus.  On one occasion, the episode was followed by a 7/10 right pressure-like retro-orbital headache lasting for about an hour.  Associated with  photophobia and phonophobia but not nausea, vomiting or visual disturbance.  On occasion, she has a pressure on the top of her head.  She reports remote history of migraines.  Over the past year, she developed a right cervical radiculopathy with pain and numbness.  MRI of cervical spine from 12/24/2018 was personally reviewed and showed C6-7 disc extrusion with right-sided spinal stenosis and neural foraminal stenosis affecting the C7 nerve root.    Sometimes she feels disoriented.  Not with the headaches but may occur prior to a headache.     Past NSAIDs:  Diclofenac; meloxicam; naproxen Past analgesic:  Tylenol;  tramadol Past anti-emetic:  Zofran 94m Past muscle relaxant:  Flexeril Past antihypertensive:  HCTZ; lisinopril Past antidepressant:  Sertraline Past antiepileptic:  Gabapentin. Would avoid topiramate due to history of recurrent kidney stones. Past antihistamine/decongestant:  Claritin  PAST MEDICAL HISTORY: Past Medical History:  Diagnosis Date   Anemia    anemia  when pregnant in 2002   Anxiety    Arthritis    "knees and back" (09/28/2016)   Asthma 5 yrs ago    told she had asthma.. does not remember doctors name.    Chronic lower back pain    Depression    takes citalopram   History of hiatal hernia    History of kidney stones    Hyperlipidemia    Hypothyroidism    LBBB (left bundle branch block)    rate-related; identified on stress test 03/21/16   Migraine    "weekly" (09/28/2016)   Positive TB test 2008   pt states she took 9 months of medicine for positive tb skin test   Shortness of breath    with exertion   Thyroid disease    [place]    MEDICATIONS: Current Outpatient Medications on File Prior to Visit  Medication Sig Dispense Refill   acetaminophen (TYLENOL) 500 MG tablet Take 1,000 mg by mouth every 6 (six) hours as needed for mild pain.     amoxicillin-clavulanate (AUGMENTIN) 875-125 MG tablet Take 1 tablet by mouth 2 (two) times daily. 20 tablet 0   aspirin EC 81 MG tablet Take 1 tablet (81 mg total) by mouth daily. 90 tablet 3   blood glucose meter kit and supplies KIT Dispense based on patient and insurance preference. Use up to four times daily as directed. (FOR ICD-9 250.00, 250.01). 1 each 0   Cholecalciferol (VITAMIN D3) 2000 units TABS Take 2,000 Units by mouth daily.      citalopram (CELEXA) 20 MG tablet Take 1 tablet by mouth once daily 90 tablet 1   cyanocobalamin 500 MCG tablet Take 1,000 mcg by mouth daily.      Flaxseed, Linseed, (FLAXSEED OIL PO) Take 1,300 mg by mouth daily.     fluticasone (FLONASE) 50 MCG/ACT  nasal spray Place 2 sprays into both nostrils daily. 16 g 6   glimepiride (AMARYL) 2 MG tablet TAKE 1 TABLET BY MOUTH ONCE DAILY BEFORE BREAKFAST 90 tablet 1   glucose blood (ONETOUCH VERIO) test strip Onetouch Verio Check blood sugar twice daily 100 each 12   indapamide (LOZOL) 1.25 MG tablet Take 1 tablet (1.25 mg total) by mouth daily. 90 tablet 3   levothyroxine (SYNTHROID) 88 MCG tablet TAKE 1 TABLET BY MOUTH ONCE DAILY BEFORE BREAKFAST 90 tablet 0   losartan (COZAAR) 50 MG tablet Take 1 tablet (50 mg total) by mouth daily. 90 tablet 1   meclizine (ANTIVERT) 25 MG tablet Take  1 tablet (25 mg total) by mouth 3 (three) times daily as needed for dizziness. 30 tablet 5   methocarbamol (ROBAXIN) 500 MG tablet Take 1 tablet (500 mg total) by mouth 4 (four) times daily. 45 tablet 1   Omega-3 Fatty Acids (FISH OIL) 1200 MG CAPS Take 1,200 mg by mouth daily.     ondansetron (ZOFRAN) 4 MG tablet Take 1 tablet (4 mg total) by mouth every 8 (eight) hours as needed for nausea or vomiting. 20 tablet 5   OneTouch Delica Lancets 05W MISC 1 Device by Does not apply route 2 (two) times daily as needed. 60 each 5   pantoprazole (PROTONIX) 40 MG tablet Take 1 tablet (40 mg total) by mouth daily. 90 tablet 3   promethazine-dextromethorphan (PROMETHAZINE-DM) 6.25-15 MG/5ML syrup Take 5 mLs by mouth 4 (four) times daily as needed. 118 mL 0   propranolol (INDERAL) 20 MG tablet Take 2 tablets (40 mg total) by mouth 2 (two) times daily. 120 tablet 3   rosuvastatin (CRESTOR) 20 MG tablet Take 1 tablet (20 mg total) by mouth daily. 90 tablet 1   Semaglutide,0.25 or 0.5MG/DOS, (OZEMPIC, 0.25 OR 0.5 MG/DOSE,) 2 MG/1.5ML SOPN Inject 0.5 mg into the skin once a week. 1.5 mL 5   No current facility-administered medications on file prior to visit.    ALLERGIES: Allergies  Allergen Reactions   Lisinopril Cough    FAMILY HISTORY: Family History  Problem Relation Age of Onset   Diabetes Mother    Hypertension Mother     Hyperlipidemia Mother    Gallbladder disease Mother    Heart disease Mother    Diabetes Sister    Hypertension Father    Hyperlipidemia Father    Colon polyps Father    Ovarian cancer Maternal Grandmother    Gallbladder disease Maternal Grandmother    Breast cancer Maternal Grandmother 25   Colon cancer Paternal Grandfather    Breast cancer Maternal Aunt        late 42s   Heart disease Other        early cad   Diabetes Brother    Heart attack Brother    Anesthesia problems Neg Hx    Hypotension Neg Hx    Malignant hyperthermia Neg Hx    Pseudochol deficiency Neg Hx    Esophageal cancer Neg Hx       Objective:  BP 111/77   Pulse 82   Resp 18   Ht _0  (1.676 m)   Wt 228 lb (103.4 kg)   SpO2 95%   BMI 36.80 kg/m   General: No acute distress.  Patient appears well-groomed.   Head:  Normocephalic/atraumatic Eyes:  Fundi examined but not visualized Neck: supple, no paraspinal tenderness, full range of motion Heart:  Regular rate and rhythm Lungs:  Clear to auscultation bilaterally Back: No paraspinal tenderness Neurological Exam: alert and oriented to person, place, and time.  Speech fluent and not dysarthric, language intact.  CN II-XII intact. Bulk and tone normal, muscle strength 5/5 throughout.  Sensation to light touch intact.  Deep tendon reflexes 2+ throughout.  Finger to nose testing intact.  Gait normal, Romberg negative.  Sharene Butters, PA-C   CC: Roma Schanz, DO

## 2021-05-11 NOTE — Patient Instructions (Signed)
  Start propranolol 20 mg twice daily.  If no improvement in headaches in 4 weeks, contact me and we can increase dose.  If you experience lightheadedness, contact us. Take Excedrin Migraine (aspirin-acetaminophen-caffeine) as needed but limit use of pain relievers to no more than 2 days out of the week.  These medications include acetaminophen, NSAIDs (ibuprofen/Advil/Motrin, naproxen/Aleve, triptans (Imitrex/sumatriptan), Excedrin, and narcotics.  This will help reduce risk of rebound headaches. If dizzy spells are severe, may use meclizine as directed but limit use. Referral to Vestibular Therapy  Continue Zofran for nausea Follow with Dr. Tomi Likens in 2-3 months  Be aware of common food triggers:  - Caffeine:  coffee, black tea, cola, Mt. Dew  - Chocolate  - Dairy:  aged cheeses (brie, blue, cheddar, gouda, [place], provolone, [place], Swiss, etc), chocolate milk, buttermilk, sour cream, limit eggs and yogurt  - Nuts, peanut butter  - Alcohol  - Cereals/grains:  FRESH breads (fresh bagels, sourdough, doughnuts), yeast productions  - Processed/canned/aged/cured meats (pre-packaged deli meats, hotdogs)  - MSG/glutamate:  soy sauce, flavor enhancer, pickled/preserved/marinated foods  - Sweeteners:  aspartame (Equal, Nutrasweet).  Sugar and Splenda are okay  - Vegetables:  legumes (lima beans, lentils, snow peas, fava beans, pinto peans, peas, garbanzo beans), sauerkraut, onions, olives, pickles  - Fruit:  avocados, bananas, citrus fruit (orange, lemon, grapefruit), mango  - Other:  Frozen meals, macaroni and cheese Routine exercise Stay adequately hydrated (aim for 64 oz water daily) Keep headache diary Maintain proper stress management Maintain proper sleep hygiene Do not skip meals Consider supplements:  magnesium citrate 400mg  daily, riboflavin 400mg  daily, coenzyme Q10 100mg  three times daily.

## 2021-05-22 ENCOUNTER — Telehealth: Payer: Self-pay | Admitting: Family Medicine

## 2021-05-22 DIAGNOSIS — E1165 Type 2 diabetes mellitus with hyperglycemia: Secondary | ICD-10-CM

## 2021-05-22 MED ORDER — OZEMPIC (0.25 OR 0.5 MG/DOSE) 2 MG/1.5ML ~~LOC~~ SOPN: 0.5000 mg | PEN_INJECTOR | SUBCUTANEOUS | 5 refills | Status: DC

## 2021-05-22 NOTE — Telephone Encounter (Signed)
Patient called stating she needed more ozempic refills, she stated that she usually gets it from our office. Please advice.   Semaglutide,0.25 or 0.5MG /DOS, (OZEMPIC, 0.25 OR 0.5 MG/DOSE,) 2 MG/1.5ML SOPN

## 2021-05-22 NOTE — Telephone Encounter (Signed)
Refill sent.

## 2021-05-23 ENCOUNTER — Other Ambulatory Visit: Payer: Self-pay | Admitting: *Deleted

## 2021-05-23 ENCOUNTER — Telehealth: Payer: Self-pay | Admitting: Family Medicine

## 2021-05-23 DIAGNOSIS — E1165 Type 2 diabetes mellitus with hyperglycemia: Secondary | ICD-10-CM

## 2021-05-23 MED ORDER — SEMAGLUTIDE (1 MG/DOSE) 4 MG/3ML ~~LOC~~ SOPN: 1.0000 mg | PEN_INJECTOR | SUBCUTANEOUS | 0 refills | Status: DC

## 2021-05-23 NOTE — Telephone Encounter (Signed)
Refill on medication Semaglutide,0.25 or 0.5MG /DOS, (OZEMPIC, 0.25 OR 0.5 MG/DOSE,) 2 MG/1.5ML SOPN [[id number]]  Pt stated she does not go through the actual pharmacy for this medication. Pt stated this message is specificity for [facility]. Please advise

## 2021-05-23 NOTE — Telephone Encounter (Signed)
Spoke with patient and she is on 1mg  now.  Med list updated and refill faxed to [facility].

## 2021-06-06 NOTE — Telephone Encounter (Signed)
Pt called regarding her ozempic, checked with [place] and heather and told her it has not come in but she would still like a call from [place] since she is [place].

## 2021-06-13 NOTE — Telephone Encounter (Signed)
Pt called again regarding her ozempic and would like [place] to give her a call since she usually is the one that handles this for her, please advise.

## 2021-06-14 NOTE — Telephone Encounter (Signed)
Called [employer] and automated system says it is in process.  They also stated that it can take up to 30 days to get medication.  Patient notified and advised her to call the company to see if she can get a voucher to take to the pharmacy.  She will call.

## 2021-06-15 ENCOUNTER — Ambulatory Visit: Payer: No Typology Code available for payment source | Admitting: Neurology

## 2021-06-23 ENCOUNTER — Other Ambulatory Visit: Payer: Self-pay | Admitting: Family Medicine

## 2021-06-23 DIAGNOSIS — E785 Hyperlipidemia, unspecified: Secondary | ICD-10-CM

## 2021-06-23 LAB — HM DIABETES EYE EXAM

## 2021-06-23 NOTE — Telephone Encounter (Signed)
Pt called and wanted to see if we have any samples of medication below. She stated she needs some until her shipment comes in

## 2021-06-23 NOTE — Telephone Encounter (Signed)
Spoke with pt. Pt made aware we did not have any samples at this time but was advised that I or our staff will reach out to the rep for samples. I also advised it would likely be next week since it is Friday and a holiday so it may not be until next week. Pt currently taking 1 MG.

## 2021-06-29 ENCOUNTER — Telehealth: Payer: Self-pay | Admitting: Family Medicine

## 2021-06-29 NOTE — Telephone Encounter (Signed)
Noted  

## 2021-06-29 NOTE — Telephone Encounter (Signed)
Pt stated one of her daughter's will be picking up her ozempic medication today, okay per Heather. Cherrie Gauze or Jake Church.

## 2021-07-03 ENCOUNTER — Telehealth: Payer: Self-pay | Admitting: *Deleted

## 2021-07-03 NOTE — Telephone Encounter (Signed)
Patient called about ozempic sample.  She was given 2mg  and she was on 1mg .  Advised that I only have 0.25/0.5mg s in stock.  She will bring back and get the new sample of 0.25/0.5mg .

## 2021-07-06 ENCOUNTER — Telehealth: Payer: Self-pay

## 2021-07-06 NOTE — Telephone Encounter (Signed)
Notified pt that Ozempic had been delivered. Med placed in [place] by [place].

## 2021-07-11 ENCOUNTER — Other Ambulatory Visit: Payer: Self-pay | Admitting: Family Medicine

## 2021-07-11 ENCOUNTER — Encounter: Payer: Self-pay | Admitting: Family Medicine

## 2021-07-11 ENCOUNTER — Ambulatory Visit (INDEPENDENT_AMBULATORY_CARE_PROVIDER_SITE_OTHER): Payer: Self-pay | Admitting: Family Medicine

## 2021-07-11 VITALS — BP 124/70 | HR 71 | Temp 98.5°F | Ht 66.0 in | Wt 229.0 lb

## 2021-07-11 DIAGNOSIS — M542 Cervicalgia: Secondary | ICD-10-CM

## 2021-07-11 DIAGNOSIS — R079 Chest pain, unspecified: Secondary | ICD-10-CM

## 2021-07-11 MED ORDER — CELECOXIB 100 MG PO CAPS: 100.0000 mg | ORAL_CAPSULE | Freq: Every day | ORAL | 2 refills | Status: DC

## 2021-07-11 MED ORDER — CYCLOBENZAPRINE HCL 10 MG PO TABS: 10.0000 mg | ORAL_TABLET | Freq: Three times a day (TID) | ORAL | 0 refills | Status: DC | PRN

## 2021-07-11 NOTE — Assessment & Plan Note (Signed)
ekg nsr Vs all stable  prob MS pain Check xrays celebrex qd Muscle relaxer prn  Ice  / heat  rto prn

## 2021-07-11 NOTE — Progress Notes (Signed)
Subjective:   By signing my name below, I, Shehryar Baig, attest that this documentation has been prepared under the direction and in the presence of Dr. Roma Schanz, DO. 07/11/2021    Patient ID: Felicia Solis, female    DOB: 10-13-1965, 56 y.o.   MRN: [id number]  Chief Complaint  Patient presents with   shrap pain from chest to left underarm    Off and on for over a week     HPI Patient is in today for a office visit.  She complains of occasional chest pain that radiates to her jaw and left axillary for the past couple of weeks. She denies having any SOB or palpitations.    Past Medical History:  Diagnosis Date   Anemia    anemia  when pregnant in 2002   Anxiety    Arthritis    "knees and back" (09/28/2016)   Asthma 5 yrs ago    told she had asthma.. does not remember doctors name.    Chronic lower back pain    Depression    takes citalopram   History of hiatal hernia    History of kidney stones    Hyperlipidemia    Hypothyroidism    LBBB (left bundle branch block)    rate-related; identified on stress test 03/21/16   Migraine    "weekly" (09/28/2016)   Positive TB test 2008   pt states she took 9 months of medicine for positive tb skin test   Shortness of breath    with exertion   Thyroid disease    Type II diabetes mellitus ([place]) dx'd 2016    Past Surgical History:  Procedure Laterality Date   DILATION AND CURETTAGE OF UTERUS  1999   ENDOMETRIAL ABLATION     EXTRACORPOREAL SHOCK WAVE LITHOTRIPSY Right 04/02/2019   Procedure: EXTRACORPOREAL SHOCK WAVE LITHOTRIPSY (ESWL);  Surgeon: Ceasar Mons, MD;  Location: [facility];  Service: Urology;  Laterality: Right;   LAPAROSCOPY     years ago ..pt does not know where surgery was done ... states" my stomach would blow up then go down"   THYROIDECTOMY  09/28/2011   Procedure: THYROIDECTOMY;  Surgeon: Izora Gala, MD;  Location: [facility];  Service: ENT;  Laterality: Right;  RIGHT THYROIDECTOMY WITH FROZEN  SECTION   THYROIDECTOMY  09/28/2016   completion of thyroidectomy/notes 09/28/2016   THYROIDECTOMY Left 09/28/2016   Procedure: COMPLETION OF THYROIDECTOMY;  Surgeon: Izora Gala, MD;  Location: [facility];  Service: ENT;  Laterality: Left;   TUBAL LIGATION      Family History  Problem Relation Age of Onset   Diabetes Mother    Hypertension Mother    Hyperlipidemia Mother    Gallbladder disease Mother    Heart disease Mother    Diabetes Sister    Hypertension Father    Hyperlipidemia Father    Colon polyps Father    Ovarian cancer Maternal Grandmother    Gallbladder disease Maternal Grandmother    Breast cancer Maternal Grandmother 51   Colon cancer Paternal Grandfather    Breast cancer Maternal Aunt        late 34s   Heart disease Other        early cad   Diabetes Brother    Heart attack Brother    Anesthesia problems Neg Hx    Hypotension Neg Hx    Malignant hyperthermia Neg Hx    Pseudochol deficiency Neg Hx    Esophageal cancer Neg Hx  Social History   Socioeconomic History   Marital status: Married    Spouse name: Not on file   Number of children: 3   Years of education: Not on file   Highest education level: Not on file  Occupational History   Occupation: CNA  Tobacco Use   Smoking status: Never   Smokeless tobacco: Never  Vaping Use   Vaping Use: Never used  Substance and Sexual Activity   Alcohol use: No    Alcohol/week: 0.0 standard drinks   Drug use: No   Sexual activity: Not on file  Other Topics Concern   Not on file  Social History Narrative   No exercise--  Walking and lifting pts at work   Right handed   Lives in single story home   Social Determinants of Health   Financial Resource Strain: Not on file  Food Insecurity: Not on file  Transportation Needs: Not on file  Physical Activity: Not on file  Stress: Not on file  Social Connections: Not on file  Intimate Partner Violence: Not on file    Outpatient Medications Prior to Visit   Medication Sig Dispense Refill   acetaminophen (TYLENOL) 500 MG tablet Take 1,000 mg by mouth every 6 (six) hours as needed for mild pain.     aspirin EC 81 MG tablet Take 1 tablet (81 mg total) by mouth daily. 90 tablet 3   blood glucose meter kit and supplies KIT Dispense based on patient and insurance preference. Use up to four times daily as directed. (FOR ICD-9 250.00, 250.01). 1 each 0   Cholecalciferol (VITAMIN D3) 2000 units TABS Take 2,000 Units by mouth daily.      citalopram (CELEXA) 20 MG tablet Take 1 tablet by mouth once daily 90 tablet 1   cyanocobalamin 500 MCG tablet Take 1,000 mcg by mouth daily.      Flaxseed, Linseed, (FLAXSEED OIL PO) Take 1,300 mg by mouth daily.     fluticasone (FLONASE) 50 MCG/ACT nasal spray Place 2 sprays into both nostrils daily. 16 g 6   glimepiride (AMARYL) 2 MG tablet TAKE 1 TABLET BY MOUTH ONCE DAILY BEFORE BREAKFAST 90 tablet 1   glucose blood (ONETOUCH VERIO) test strip Onetouch Verio Check blood sugar twice daily 100 each 12   indapamide (LOZOL) 1.25 MG tablet Take 1 tablet (1.25 mg total) by mouth daily. 90 tablet 3   levothyroxine (SYNTHROID) 88 MCG tablet TAKE 1 TABLET BY MOUTH ONCE DAILY BEFORE BREAKFAST 90 tablet 0   losartan (COZAAR) 50 MG tablet Take 1 tablet (50 mg total) by mouth daily. 90 tablet 1   meclizine (ANTIVERT) 25 MG tablet Take 1 tablet (25 mg total) by mouth 3 (three) times daily as needed for dizziness. 30 tablet 5   methocarbamol (ROBAXIN) 500 MG tablet Take 1 tablet (500 mg total) by mouth 4 (four) times daily. 45 tablet 1   Omega-3 Fatty Acids (FISH OIL) 1200 MG CAPS Take 1,200 mg by mouth daily.     ondansetron (ZOFRAN) 4 MG tablet Take 1 tablet (4 mg total) by mouth every 8 (eight) hours as needed for nausea or vomiting. 20 tablet 5   OneTouch Delica Lancets 22W MISC 1 Device by Does not apply route 2 (two) times daily as needed. 60 each 5   pantoprazole (PROTONIX) 40 MG tablet Take 1 tablet (40 mg total) by mouth  daily. 90 tablet 3   propranolol (INDERAL) 20 MG tablet Take 1 tablet (20 mg total) by mouth 2 (  two) times daily. 120 tablet 3   rosuvastatin (CRESTOR) 20 MG tablet Take 1 tablet by mouth once daily 30 tablet 0   Semaglutide, 1 MG/DOSE, 4 MG/3ML SOPN Inject 1 mg as directed once a week. 3 mL 0   amoxicillin-clavulanate (AUGMENTIN) 875-125 MG tablet Take 1 tablet by mouth 2 (two) times daily. 20 tablet 0   promethazine-dextromethorphan (PROMETHAZINE-DM) 6.25-15 MG/5ML syrup Take 5 mLs by mouth 4 (four) times daily as needed. 118 mL 0   No facility-administered medications prior to visit.    Allergies  Allergen Reactions   Lisinopril Cough    Review of Systems  HENT:         (+)Jaw pain  Respiratory:  Negative for shortness of breath.   Cardiovascular:  Positive for chest pain. Negative for palpitations.  Skin:        (+)Pain in left axillary      Objective:    Physical Exam Constitutional:      General: She is not in acute distress.    Appearance: Normal appearance. She is not ill-appearing.  HENT:     Head: Normocephalic and atraumatic.     Right Ear: External ear normal.     Left Ear: External ear normal.  Eyes:     Extraocular Movements: Extraocular movements intact.     Pupils: Pupils are equal, round, and reactive to light.  Cardiovascular:     Rate and Rhythm: Normal rate and regular rhythm.     Heart sounds: Normal heart sounds. No murmur heard.   No gallop.     Comments: EKG results are normal Pulmonary:     Effort: Pulmonary effort is normal. No respiratory distress.     Breath sounds: Normal breath sounds. No wheezing or rales.  Chest:  Breasts:    Left: No tenderness.  Skin:    General: Skin is warm and dry.  Neurological:     Mental Status: She is alert and oriented to person, place, and time.  Psychiatric:        Behavior: Behavior normal.        Judgment: Judgment normal.    BP 124/70    Pulse 71    Temp 98.5 F (36.9 C) (Oral)    Ht _0  (1.676  m)    Wt 229 lb (103.9 kg)    SpO2 98%    BMI 36.96 kg/m  Wt Readings from Last 3 Encounters:  07/11/21 229 lb (103.9 kg)  05/11/21 228 lb (103.4 kg)  12/16/20 224 lb 6.4 oz (101.8 kg)    Diabetic Foot Exam - Simple   No data filed    Lab Results  Component Value Date   WBC 8.2 08/19/2020   HGB 13.7 08/19/2020   HCT 41.7 08/19/2020   PLT 320 08/19/2020   GLUCOSE 137 (H) 08/19/2020   CHOL 112 08/19/2020   TRIG 123 08/19/2020   HDL 38 (L) 08/19/2020   LDLCALC 54 08/19/2020   ALT 23 08/19/2020   AST 22 08/19/2020   NA 140 08/19/2020   K 3.7 08/19/2020   CL 101 08/19/2020   CREATININE 0.51 08/19/2020   BUN 9 08/19/2020   CO2 27 08/19/2020   TSH 0.85 08/19/2020   HGBA1C 9.8 (H) 08/19/2020   MICROALBUR 13.7 08/19/2020    Lab Results  Component Value Date   TSH 0.85 08/19/2020   Lab Results  Component Value Date   WBC 8.2 08/19/2020   HGB 13.7 08/19/2020   HCT 41.7 08/19/2020  MCV 88.3 08/19/2020   PLT 320 08/19/2020   Lab Results  Component Value Date   NA 140 08/19/2020   K 3.7 08/19/2020   CO2 27 08/19/2020   GLUCOSE 137 (H) 08/19/2020   BUN 9 08/19/2020   CREATININE 0.51 08/19/2020   BILITOT 0.6 08/19/2020   ALKPHOS 41 05/26/2019   AST 22 08/19/2020   ALT 23 08/19/2020   PROT 7.0 08/19/2020   ALBUMIN 4.2 05/26/2019   CALCIUM 9.4 08/19/2020   ANIONGAP 14 04/03/2019   GFR 166.70 05/26/2019   Lab Results  Component Value Date   CHOL 112 08/19/2020   Lab Results  Component Value Date   HDL 38 (L) 08/19/2020   Lab Results  Component Value Date   LDLCALC 54 08/19/2020   Lab Results  Component Value Date   TRIG 123 08/19/2020   Lab Results  Component Value Date   CHOLHDL 2.9 08/19/2020   Lab Results  Component Value Date   HGBA1C 9.8 (H) 08/19/2020       Assessment & Plan:   Problem List Items Addressed This Visit       Unprioritized   Chest pain - Primary    ekg nsr Vs all stable  prob MS pain Check xrays celebrex  qd Muscle relaxer prn  Ice  / heat  rto prn        Relevant Medications   celecoxib (CELEBREX) 100 MG capsule   cyclobenzaprine (FLEXERIL) 10 MG tablet   Other Relevant Orders   EKG 12-Lead (Completed)   CBC with Differential/Platelet   Comprehensive metabolic panel   TSH   DG Cervical Spine Complete   DG Chest 2 View   Neck pain   Relevant Medications   cyclobenzaprine (FLEXERIL) 10 MG tablet   Other Relevant Orders   DG Cervical Spine Complete     Meds ordered this encounter  Medications   celecoxib (CELEBREX) 100 MG capsule    Sig: Take 1 capsule (100 mg total) by mouth daily.    Dispense:  30 capsule    Refill:  2   cyclobenzaprine (FLEXERIL) 10 MG tablet    Sig: Take 1 tablet (10 mg total) by mouth 3 (three) times daily as needed for muscle spasms.    Dispense:  30 tablet    Refill:  0    I, Dr. Roma Schanz, DO, personally preformed the services described in this documentation.  All medical record entries made by the scribe were at my direction and in my presence.  I have reviewed the chart and discharge instructions (if applicable) and agree that the record reflects my personal performance and is accurate and complete. 07/01/2021   I,Shehryar Baig,acting as a scribe for [name], DO.,have documented all relevant documentation on the behalf of [name], DO,as directed by  [name], DO while in the presence of [name], DO.   [name], DO

## 2021-07-11 NOTE — Patient Instructions (Signed)
Chest Wall Pain Chest wall pain is pain in or around the bones and muscles of your chest. Sometimes, an injury causes this pain. Excessive coughing or overuse of arm and chest muscles may also cause chest wall pain. Sometimes, the cause may not be known. This pain may take several weeks or longer to get better. Follow these instructions at home: Managing pain, stiffness, and swelling  If directed, put ice on the painful area: Put ice in a plastic bag. Place a towel between your skin and the bag. Leave the ice on for 20 minutes, 2-3 times per day. Activity Rest as told by your health care provider. Avoid activities that cause pain. These include any activities that use your chest muscles or your abdominal and side muscles to lift heavy items. Ask your health care provider what activities are safe for you. General instructions  Take over-the-counter and prescription medicines only as told by your health care provider. Do not use any products that contain nicotine or tobacco, such as cigarettes, e-cigarettes, and chewing tobacco. These can delay healing after injury. If you need help quitting, ask your health care provider. Keep all follow-up visits as told by your health care provider. This is important. Contact a health care provider if: You have a fever. Your chest pain becomes worse. You have new symptoms. Get help right away if: You have nausea or vomiting. You feel sweaty or light-headed. You have a cough with mucus from your lungs (sputum) or you cough up blood. You develop shortness of breath. These symptoms may represent a serious problem that is an emergency. Do not wait to see if the symptoms will go away. Get medical help right away. Call your local emergency services (911 in the [place] Do not drive yourself to the hospital. Summary Chest wall pain is pain in or around the bones and muscles of your chest. Depending on the cause, it may be treated with ice, rest, medicines, and  avoiding activities that cause pain. Contact a health care provider if you have a fever, worsening chest pain, or new symptoms. Get help right away if you feel light-headed or you develop shortness of breath. These symptoms may be an emergency. This information is not intended to replace advice given to you by your health care provider. Make sure you discuss any questions you have with your health care provider. Document Revised: 08/26/2020 Document Reviewed: 08/26/2020 Elsevier Patient Education  [employer].

## 2021-07-12 ENCOUNTER — Other Ambulatory Visit: Payer: Self-pay

## 2021-07-12 ENCOUNTER — Ambulatory Visit (HOSPITAL_BASED_OUTPATIENT_CLINIC_OR_DEPARTMENT_OTHER)
Admission: RE | Admit: 2021-07-12 | Discharge: 2021-07-12 | Disposition: A | Discharge status: Home / Self Care | Payer: Self-pay | Source: Ambulatory Visit | Attending: Family Medicine | Admitting: Family Medicine

## 2021-07-12 DIAGNOSIS — R079 Chest pain, unspecified: Secondary | ICD-10-CM | POA: Insufficient documentation

## 2021-07-12 DIAGNOSIS — M542 Cervicalgia: Secondary | ICD-10-CM | POA: Insufficient documentation

## 2021-07-12 LAB — COMPREHENSIVE METABOLIC PANEL
ALT: 22 U/L (ref 0–35)
AST: 22 U/L (ref 0–37)
Albumin: 4.6 g/dL (ref 3.5–5.2)
Alkaline Phosphatase: 36 U/L — ABNORMAL LOW (ref 39–117)
BUN: 13 mg/dL (ref 6–23)
CO2: 30 mEq/L (ref 19–32)
Calcium: 9.4 mg/dL (ref 8.4–10.5)
Chloride: 101 mEq/L (ref 96–112)
Creatinine, Ser: 0.56 mg/dL (ref 0.40–1.20)
GFR: 101.79 mL/min (ref >60.00)
Glucose, Bld: 97 mg/dL (ref 70–99)
Potassium: 3.8 mEq/L (ref 3.5–5.1)
Sodium: 141 mEq/L (ref 135–145)
Total Bilirubin: 0.5 mg/dL (ref 0.2–1.2)
Total Protein: 7.6 g/dL (ref 6.0–8.3)

## 2021-07-12 LAB — CBC WITH DIFFERENTIAL/PLATELET
Basophils Absolute: 0.1 10*3/uL (ref 0.0–0.1)
Basophils Relative: 0.8 % (ref 0.0–3.0)
Eosinophils Absolute: 0.2 10*3/uL (ref 0.0–0.7)
Eosinophils Relative: 2.2 % (ref 0.0–5.0)
HCT: 39.8 % (ref 36.0–46.0)
Hemoglobin: 13.1 g/dL (ref 12.0–15.0)
Lymphocytes Relative: 47 % — ABNORMAL HIGH (ref 12.0–46.0)
Lymphs Abs: 4.3 10*3/uL — ABNORMAL HIGH (ref 0.7–4.0)
MCHC: 32.9 g/dL (ref 30.0–36.0)
MCV: 86.3 fl (ref 78.0–100.0)
Monocytes Absolute: 0.7 10*3/uL (ref 0.1–1.0)
Monocytes Relative: 7.5 % (ref 3.0–12.0)
Neutro Abs: 3.9 10*3/uL (ref 1.4–7.7)
Neutrophils Relative %: 42.5 % — ABNORMAL LOW (ref 43.0–77.0)
Platelets: 275 10*3/uL (ref 150.0–400.0)
RBC: 4.61 Mil/uL (ref 3.87–5.11)
RDW: 14.1 % (ref 11.5–15.5)
WBC: 9.2 10*3/uL (ref 4.0–10.5)

## 2021-07-12 LAB — TSH: TSH: 2.39 u[IU]/mL (ref 0.35–5.50)

## 2021-07-14 ENCOUNTER — Other Ambulatory Visit: Payer: Self-pay

## 2021-07-14 DIAGNOSIS — M542 Cervicalgia: Secondary | ICD-10-CM

## 2021-07-22 ENCOUNTER — Other Ambulatory Visit: Payer: Self-pay | Admitting: Family Medicine

## 2021-07-22 DIAGNOSIS — E785 Hyperlipidemia, unspecified: Secondary | ICD-10-CM

## 2021-08-01 ENCOUNTER — Encounter: Payer: Self-pay | Admitting: Orthopaedic Surgery

## 2021-08-01 ENCOUNTER — Other Ambulatory Visit: Payer: Self-pay

## 2021-08-01 ENCOUNTER — Ambulatory Visit (INDEPENDENT_AMBULATORY_CARE_PROVIDER_SITE_OTHER): Payer: Self-pay | Admitting: Orthopaedic Surgery

## 2021-08-01 VITALS — BP 106/72 | Ht 66.0 in | Wt 227.0 lb

## 2021-08-01 DIAGNOSIS — M5020 Other cervical disc displacement, unspecified cervical region: Secondary | ICD-10-CM

## 2021-08-01 DIAGNOSIS — M4722 Other spondylosis with radiculopathy, cervical region: Secondary | ICD-10-CM

## 2021-08-01 NOTE — Progress Notes (Signed)
Office Visit Note   Patient: Felicia Solis           Date of Birth: 1965-10-15           MRN: [id number] Visit Date: 08/01/2021              Requested by: [address] [place], [place] [address] [place],   [id number] PCP: Carollee Herter, Alferd Apa, DO   Assessment & Plan: Visit Diagnoses:  1. HNP (herniated nucleus pulposus), cervical   2. Other spondylosis with radiculopathy, cervical region     Plan: Patient will check in some insurance options she will call if she like to proceed with therapy.  Currently repeat MRI scan would be needed if her symptoms progress.  We discussed previous spondylosis at C6-7.  Recent x-rays showed straightening of the cervical spine and narrowing at C6-7 with endplate spurring.  If she develops progressive symptoms she will return.  Follow-Up Instructions: Return if symptoms worsen or fail to improve.   Orders:  No orders of the defined types were placed in this encounter.  No orders of the defined types were placed in this encounter.     Procedures: No procedures performed   Clinical Data: No additional findings.   Subjective: Chief Complaint  Patient presents with   Neck - Pain    HPI 56 year old female with chronic neck pain she states has been getting worse pain hurts in the posterior aspect of her neck little bit more on the left shoulder than right sometimes radiates down to her hand.  She is right-hand dominant.  She had an EKG which was normal.  She got some relief with Celebrex and Flexeril.  She is noted discomfort when she turns her neck.  No gait disturbance no fever or chills.  Seen several years ago with a disc protrusion C6-7 with some hand numbness.  Cervical MRI scan was 12/24/2018 with moderate stenosis at C6-7 with paracentral disc extrusion worse on the right than left.  Additionally she has had prone to bronchitis asthma anxiety DeQuervaine's  tenosynovitis and low back pain in the distant past.  Previous  thyroidectomy.  Review of Systems all the systems noncontributory to HPI.   Objective: Vital Signs: BP 106/72    Ht 5\' 6"  (1.676 m)    Wt 227 lb (103 kg)    BMI 36.64 kg/m   Physical Exam Constitutional:      Appearance: She is well-developed.  HENT:     Head: Normocephalic.     Right Ear: External ear normal.     Left Ear: External ear normal. There is no impacted cerumen.  Eyes:     Pupils: Pupils are equal, round, and reactive to light.  Neck:     Thyroid: No thyromegaly.     Trachea: No tracheal deviation.  Cardiovascular:     Rate and Rhythm: Normal rate.  Pulmonary:     Effort: Pulmonary effort is normal.  Abdominal:     Palpations: Abdomen is soft.  Musculoskeletal:     Cervical back: No rigidity.  Skin:    General: Skin is warm and dry.  Neurological:     Mental Status: She is alert and oriented to person, place, and time.  Psychiatric:        Behavior: Behavior normal.    Ortho Exam positive brachial plexus tenderness right and left increased pain with compression.  Reflexes are 2+ biceps triceps wrist flexion extension is intact.  Normal  heel toe gait no clonus.  Specialty Comments:  No specialty comments available.  Imaging: No results found.   PMFS History: Patient Active Problem List   Diagnosis Date Noted   Other spondylosis with radiculopathy, cervical region 08/01/2021   Neck pain 07/11/2021   Skin tag 12/18/2020   Gastroenteritis 12/06/2020   COVID-19 07/03/2020   TMJ (sprain of temporomandibular joint), initial encounter 07/03/2020   Uncontrolled type 2 diabetes mellitus with hyperglycemia ([place]) 02/25/2020   Trapezius muscle strain, left, initial encounter 08/17/2019   Left wrist pain 08/11/2019   Acute pain of left shoulder 08/11/2019   Acute nonintractable headache 08/11/2019   De Quervain's tenosynovitis, left 08/11/2019   Vertigo 06/09/2019   Right acute serous otitis media 06/09/2019   Impacted cerumen of left ear 06/09/2019    Pelvic pain 02/23/2019   HNP (herniated nucleus pulposus), cervical 01/04/2019   Epigastric mass 10/21/2018   Myalgia 03/18/2018   Hyperlipidemia associated with type 2 diabetes mellitus ([place]) 03/18/2018   Nausea and vomiting 12/02/2017   Hematuria 12/02/2017   Pansinusitis 11/19/2017   Agoraphobia with panic attacks 11/19/2017   Preventative health care 09/27/2017   Hyperlipidemia LDL goal <100 09/18/2017   Vitamin D deficiency 09/18/2017   Hypothyroidism 09/18/2017   Other intervertebral disc degeneration, lumbar region 04/07/2017   Left hip pain 04/07/2017   Hyperlipidemia LDL goal <70 04/07/2017   Essential hypertension 04/07/2017   Acute vaginitis 04/07/2017   S/P complete thyroidectomy 09/28/2016   Memory loss 08/23/2016   Thyroid nodule 08/02/2016   Plantar fasciitis of right foot 12/01/2015   Knee pain, right 12/01/2015   Headache, migraine 09/06/2015   Bilateral knee pain 08/22/2015   Thoracic myofascial strain 08/22/2015   Dysphagia 04/10/2015   Chest pain 04/10/2015   Epigastric pain 04/10/2015   Functional diarrhea 04/10/2015   NSAID long-term use 04/10/2015   Midepigastric pain 03/22/2015   Cough 01/07/2015   Allergic rhinitis 12/13/2014   Acute bronchitis 12/13/2014   Blood in stool 10/01/2014   DM (diabetes mellitus) type II uncontrolled, periph vascular disorder 08/13/2014   Abnormal ECG 05/14/2012   Breast pain 10/23/2011   ASCUS on Pap smear 10/08/2011   H/O thyroid nodule 08/27/2011   Depression with anxiety 08/27/2011   Heavy periods 08/27/2011   Past Medical History:  Diagnosis Date   Anemia    anemia  when pregnant in 2002   Anxiety    Arthritis    "knees and back" (09/28/2016)   Asthma 5 yrs ago    told she had asthma.. does not remember doctors name.    Chronic lower back pain    Depression    takes citalopram   History of hiatal hernia    History of kidney stones    Hyperlipidemia    Hypothyroidism    LBBB (left bundle branch block)     rate-related; identified on stress test 03/21/16   Migraine    "weekly" (09/28/2016)   Positive TB test 2008   pt states she took 9 months of medicine for positive tb skin test   Shortness of breath    with exertion   Thyroid disease    Type II diabetes mellitus ([place]) dx'd 2016    Family History  Problem Relation Age of Onset   Diabetes Mother    Hypertension Mother    Hyperlipidemia Mother    Gallbladder disease Mother    Heart disease Mother    Diabetes Sister    Hypertension Father    Hyperlipidemia Father  Colon polyps Father    Ovarian cancer Maternal Grandmother    Gallbladder disease Maternal Grandmother    Breast cancer Maternal Grandmother 73   Colon cancer Paternal Grandfather    Breast cancer Maternal Aunt        late 68s   Heart disease Other        early cad   Diabetes Brother    Heart attack Brother    Anesthesia problems Neg Hx    Hypotension Neg Hx    Malignant hyperthermia Neg Hx    Pseudochol deficiency Neg Hx    Esophageal cancer Neg Hx     Past Surgical History:  Procedure Laterality Date   DILATION AND CURETTAGE OF UTERUS  1999   ENDOMETRIAL ABLATION     EXTRACORPOREAL SHOCK WAVE LITHOTRIPSY Right 04/02/2019   Procedure: EXTRACORPOREAL SHOCK WAVE LITHOTRIPSY (ESWL);  Surgeon: Ceasar Mons, MD;  Location: [facility];  Service: Urology;  Laterality: Right;   LAPAROSCOPY     years ago ..pt does not know where surgery was done ... states" my stomach would blow up then go down"   THYROIDECTOMY  09/28/2011   Procedure: THYROIDECTOMY;  Surgeon: Izora Gala, MD;  Location: [facility];  Service: ENT;  Laterality: Right;  RIGHT THYROIDECTOMY WITH FROZEN SECTION   THYROIDECTOMY  09/28/2016   completion of thyroidectomy/notes 09/28/2016   THYROIDECTOMY Left 09/28/2016   Procedure: COMPLETION OF THYROIDECTOMY;  Surgeon: Izora Gala, MD;  Location: [facility];  Service: ENT;  Laterality: Left;   TUBAL LIGATION     Social History   Occupational History    Occupation: CNA  Tobacco Use   Smoking status: Never   Smokeless tobacco: Never  Vaping Use   Vaping Use: Never used  Substance and Sexual Activity   Alcohol use: No    Alcohol/week: 0.0 standard drinks   Drug use: No   Sexual activity: Not on file

## 2021-08-02 ENCOUNTER — Other Ambulatory Visit: Payer: Self-pay

## 2021-08-02 ENCOUNTER — Emergency Department (HOSPITAL_BASED_OUTPATIENT_CLINIC_OR_DEPARTMENT_OTHER)
Admission: EM | Admit: 2021-08-02 | Discharge: 2021-08-02 | Disposition: A | Discharge status: Home / Self Care | Payer: Self-pay | Attending: Emergency Medicine | Admitting: Emergency Medicine

## 2021-08-02 ENCOUNTER — Emergency Department (HOSPITAL_BASED_OUTPATIENT_CLINIC_OR_DEPARTMENT_OTHER): Payer: Self-pay

## 2021-08-02 ENCOUNTER — Encounter (HOSPITAL_BASED_OUTPATIENT_CLINIC_OR_DEPARTMENT_OTHER): Payer: Self-pay | Admitting: Emergency Medicine

## 2021-08-02 DIAGNOSIS — E119 Type 2 diabetes mellitus without complications: Secondary | ICD-10-CM | POA: Insufficient documentation

## 2021-08-02 DIAGNOSIS — D649 Anemia, unspecified: Secondary | ICD-10-CM | POA: Insufficient documentation

## 2021-08-02 DIAGNOSIS — Z7982 Long term (current) use of aspirin: Secondary | ICD-10-CM | POA: Insufficient documentation

## 2021-08-02 DIAGNOSIS — Z7984 Long term (current) use of oral hypoglycemic drugs: Secondary | ICD-10-CM | POA: Insufficient documentation

## 2021-08-02 DIAGNOSIS — R079 Chest pain, unspecified: Secondary | ICD-10-CM | POA: Insufficient documentation

## 2021-08-02 LAB — BASIC METABOLIC PANEL
Anion gap: 10 (ref 5–15)
BUN: 11 mg/dL (ref 6–20)
CO2: 27 mmol/L (ref 22–32)
Calcium: 9.2 mg/dL (ref 8.9–10.3)
Chloride: 101 mmol/L (ref 98–111)
Creatinine, Ser: 0.64 mg/dL (ref 0.44–1.00)
GFR, Estimated: >60 mL/min (ref >60)
Glucose, Bld: 106 mg/dL — ABNORMAL HIGH (ref 70–99)
Potassium: 3.8 mmol/L (ref 3.5–5.1)
Sodium: 138 mmol/L (ref 135–145)

## 2021-08-02 LAB — CBC
HCT: 40 % (ref 36.0–46.0)
Hemoglobin: 13.1 g/dL (ref 12.0–15.0)
MCH: 28.2 pg (ref 26.0–34.0)
MCHC: 32.8 g/dL (ref 30.0–36.0)
MCV: 86 fL (ref 80.0–100.0)
Platelets: 257 10*3/uL (ref 150–400)
RBC: 4.65 MIL/uL (ref 3.87–5.11)
RDW: 13.2 % (ref 11.5–15.5)
WBC: 8 10*3/uL (ref 4.0–10.5)
nRBC: 0 % (ref 0.0–0.2)

## 2021-08-02 LAB — PREGNANCY, URINE: Preg Test, Ur: NEGATIVE

## 2021-08-02 LAB — TROPONIN I (HIGH SENSITIVITY)
Troponin I (High Sensitivity): <2 ng/L (ref <18)
Troponin I (High Sensitivity): <2 ng/L (ref <18)

## 2021-08-02 LAB — D-DIMER, QUANTITATIVE: D-Dimer, Quant: <0.27 ug/mL-FEU (ref 0.00–0.50)

## 2021-08-02 NOTE — ED Notes (Signed)
ED Provider at bedside. 

## 2021-08-02 NOTE — ED Provider Notes (Signed)
[facility] DEPARTMENT Provider Note   CSN: [id number] Arrival date & time: 08/02/21  1248     History Chief Complaint  Patient presents with   Chest Pain    Felicia Solis is a 56 y.o. female with h/o DM presents to the ER for evaluation of episodic sharp chest pain for the past month. She reports that the chest pain episodes are becoming more frequent and are up to 2-3 times a day now. She reports the pain lasts less than a second each time and the pain is random. She was recently seen and evaluated by her PCP on 07/11/21 for this and was given Celebrex and Flexeril, she reports she took the Flexeril one time and didn't find much relief and it made her tired, so she stopped. She has tried the Celebrex 2-3 times without much relief. She denies any other accompanying symptoms with the chest pain. She denies any palpitations, SOB, nausea, abdominal pain, lightheadedness, or dizziness.    Chest Pain Associated symptoms: no abdominal pain, no cough, no dizziness, no nausea, no palpitations, no shortness of breath and no vomiting       Home Medications Prior to Admission medications   Medication Sig Start Date End Date Taking? Authorizing Provider  acetaminophen (TYLENOL) 500 MG tablet Take 1,000 mg by mouth every 6 (six) hours as needed for mild pain. Patient not taking: Reported on 08/01/2021    [provider]  aspirin EC 81 MG tablet Take 1 tablet (81 mg total) by mouth daily. Patient not taking: Reported on 08/01/2021 03/07/16   End, Harrell Gave, MD  blood glucose meter kit and supplies KIT Dispense based on patient and insurance preference. Use up to four times daily as directed. (FOR ICD-9 250.00, 250.01). 09/17/17   Carollee Herter, Alferd Apa, DO  celecoxib (CELEBREX) 100 MG capsule Take 1 capsule (100 mg total) by mouth daily. 07/11/21   [name], DO  Cholecalciferol (VITAMIN D3) 2000 units TABS Take 2,000 Units by mouth daily.     [provider]  citalopram (CELEXA) 20 MG tablet Take 1 tablet by mouth once daily 04/24/21   Carollee Herter, Alferd Apa, DO  cyanocobalamin 500 MCG tablet Take 1,000 mcg by mouth daily.     [provider]  cyclobenzaprine (FLEXERIL) 10 MG tablet Take 1 tablet (10 mg total) by mouth 3 (three) times daily as needed for muscle spasms. 07/11/21   Roma Schanz R, DO  Flaxseed, Linseed, (FLAXSEED OIL PO) Take 1,300 mg by mouth daily.    [provider]  fluticasone (FLONASE) 50 MCG/ACT nasal spray Place 2 sprays into both nostrils daily. 08/19/20   [name], DO  glimepiride (AMARYL) 2 MG tablet TAKE 1 TABLET BY MOUTH ONCE DAILY BEFORE BREAKFAST 04/24/21   Carollee Herter, Alferd Apa, DO  glucose blood (ONETOUCH VERIO) test strip [facility] Check blood sugar twice daily 05/10/20   Carollee Herter, Alferd Apa, DO  indapamide (LOZOL) 1.25 MG tablet Take 1 tablet (1.25 mg total) by mouth daily. 03/23/21   [name], DO  levothyroxine (SYNTHROID) 88 MCG tablet TAKE 1 TABLET BY MOUTH ONCE DAILY BEFORE BREAKFAST 05/03/21   Carollee Herter, Alferd Apa, DO  losartan (COZAAR) 50 MG tablet Take 1 tablet (50 mg total) by mouth daily. 10/03/20   [name], DO  meclizine (ANTIVERT) 25 MG tablet Take 1 tablet (25 mg total) by mouth 3 (three) times daily as needed for  dizziness. 12/02/20   Pieter Partridge, DO  methocarbamol (ROBAXIN) 500 MG tablet Take 1 tablet (500 mg total) by mouth 4 (four) times daily. Patient not taking: Reported on 08/01/2021 07/01/20   Carollee Herter, Alferd Apa, DO  Omega-3 Fatty Acids (FISH OIL) 1200 MG CAPS Take 1,200 mg by mouth daily.    [provider]  ondansetron (ZOFRAN) 4 MG tablet Take 1 tablet (4 mg total) by mouth every 8 (eight) hours as needed for nausea or vomiting. 12/02/20   Pieter Partridge, DO  OneTouch Delica Lancets 34J MISC 1 Device by Does not apply route 2 (two) times daily as needed. 05/10/20   [name], DO  pantoprazole (PROTONIX) 40 MG  tablet Take 1 tablet (40 mg total) by mouth daily. 08/19/20   [name], DO  propranolol (INDERAL) 20 MG tablet Take 1 tablet (20 mg total) by mouth 2 (two) times daily. 05/11/21   Rondel Jumbo, PA-C  rosuvastatin (CRESTOR) 20 MG tablet Take 1 tablet by mouth once daily 07/24/21   Carollee Herter, Alferd Apa, DO  Semaglutide, 1 MG/DOSE, 4 MG/3ML SOPN Inject 1 mg as directed once a week. 05/23/21   [name], DO      Allergies    Lisinopril    Review of Systems   Review of Systems  Respiratory:  Negative for cough and shortness of breath.   Cardiovascular:  Positive for chest pain. Negative for palpitations.  Gastrointestinal:  Negative for abdominal pain, nausea and vomiting.  Neurological:  Negative for dizziness, syncope and light-headedness.   Physical Exam Updated Vital Signs BP 106/85    Pulse 69    Temp 98.6 F (37 C) (Oral)    Resp 16    Ht _0  (1.676 m)    Wt 103 kg    SpO2 100%    BMI 36.64 kg/m  Physical Exam Vitals and nursing note reviewed.  Constitutional:      General: She is not in acute distress.    Appearance: Normal appearance. She is not ill-appearing, toxic-appearing or diaphoretic.  HENT:     Head: Normocephalic and atraumatic.  Eyes:     General: No scleral icterus. Cardiovascular:     Rate and Rhythm: Normal rate and regular rhythm.     Heart sounds: Normal heart sounds. No murmur heard. Pulmonary:     Effort: Pulmonary effort is normal. No respiratory distress.     Breath sounds: Normal breath sounds.     Comments: CTAB.  No respiratory distress, accessory muscle use, tripoding, nasal flaring, or cyanosis present.  Patient speaking in full sentences with ease.  Satting 100% on room air. Chest:     Chest wall: No mass, deformity, tenderness or crepitus.  Abdominal:     General: Abdomen is flat. Bowel sounds are normal.     Palpations: Abdomen is soft.     Tenderness: There is no abdominal tenderness. There is no guarding or  rebound.  Musculoskeletal:        General: No deformity.     Cervical back: Normal range of motion.     Right lower leg: No edema.     Left lower leg: No edema.  Skin:    General: Skin is warm and dry.  Neurological:     General: No focal deficit present.     Mental Status: She is alert. Mental status is at baseline.    ED Results / Procedures / Treatments  Labs (all labs ordered are listed, but only abnormal results are displayed) Labs Reviewed  BASIC METABOLIC PANEL - Abnormal; Notable for the following components:      Result Value   Glucose, Bld 106 (*)    All other components within normal limits  CBC  PREGNANCY, URINE  D-DIMER, QUANTITATIVE  TROPONIN I (HIGH SENSITIVITY)  TROPONIN I (HIGH SENSITIVITY)    EKG EKG Interpretation  Date/Time:  Wednesday August 02 2021 12:59:15 EST Ventricular Rate:  71 PR Interval:  162 QRS Duration: 82 QT Interval:  410 QTC Calculation: 445 R Axis:   125 Text Interpretation: Normal sinus rhythm Right axis deviation Cannot rule out Anterior infarct , age undetermined Abnormal ECG When compared with ECG of 16-Aug-2018 20:32,  no change Confirmed by Orpah Greek [phone]) on 08/02/2021 4:00:17 PM  Radiology DG Chest 2 View  Result Date: 08/02/2021 CLINICAL DATA:  Intermittent chest pain for 2 weeks EXAM: CHEST - 2 VIEW COMPARISON:  07/12/2021 FINDINGS: Normal heart size, mediastinal contours, and pulmonary vascularity. Minimal chronic central peribronchial thickening. Lungs otherwise clear. No infiltrate, pleural effusion, or pneumothorax. Bones unremarkable. IMPRESSION: No acute abnormalities. Electronically Signed   By: Lavonia Dana M.D.   On: 08/02/2021 13:21    Procedures Procedures   Medications Ordered in ED Medications - No data to display  ED Course/ Medical Decision Making/ A&P                           Medical Decision Making Amount and/or Complexity of Data Reviewed Labs: ordered. Radiology:  ordered.   56 year old female presents to the emergency department for intermittent chest pain off and on for the past month without any other associated symptoms.  Differential diagnosis includes was not limited to ACS, PE, precordial catch syndrome, pneumonia, pneumothorax, MSK, costochondritis, dissection.  Vital signs are unremarkable.  Patient normotensive, afebrile, normal heart rate, satting on room air.  Physical exam is also unremarkable.  No lower leg edema.  No tenderness palpation of the chest.  No crepitus or mass palpated or visualized.  Lungs clear to auscultation bilaterally.  Heart rate normal and she has palpable pulses in her radial, DP, and PT. Labs, chest x-ray, and EKG ordered.  At this time, I have a low suspicion for dissection given the intermittent, lasting less than a second pain especially for a month of symptoms, this is not characteristic for dissection.  I independently reviewed and interpreted the patient's labs and imaging.  D-dimer is undetectable.  CBC shows no signs leukocytosis or anemia.  BMP shows mildly elevated glucose at 106 although patient is known type II diabetic.  No other electrolyte abnormality seen.  Troponin was less than 2 with a repeat of less than 2 as well, delta approximately 0.  A pregnancy test was ordered in triage although the patient is status post ablation.  EKG shows normal sinus rhythm with a right axis deviation but is unchanged from reading in 2020.  Chest x-ray shows some mild thickening however there is no acute pulmonary process visualized.  No pneumonia or pneumothorax visualized.  Given these results, I have a low suspicion for any ACS with the flat undetectable troponins and no EKG changes. Low suspicion of PE given undetectable D-dimer.   HEAR score of 3, with the following the pathway needs to discharge home.  I do think it is reasonable to refer the patient to cardiology as she has not seen them yet and keeps  having this chest pain  with no known cause.  I discussed the reassuring lab and imaging findings with the patient.  I recommended that she continue to take the Flexeril and Naprosyn that she was given at her PCP office and to adhere to the direction.  That she follow-up with her PCP given her ER visit today and discussed with her her negative results and for further investigation on this episodic chest pain.  Strict return precautions were discussed.  Patient agrees to plan.  Patient is stable being discharged home in good condition.  I discussed this case with my attending physician who cosigned this note including patient's presenting symptoms, physical exam, and planned diagnostics and interventions. Attending physician stated agreement with plan or made changes to plan which were implemented.    Final Clinical Impression(s) / ED Diagnoses Final diagnoses:  Chest pain, unspecified type    Rx / DC Orders ED Discharge Orders     None         Sherrell Puller, PA-C 08/04/21 1217    Orpah Greek, MD 08/04/21 2325

## 2021-08-02 NOTE — Progress Notes (Deleted)
NEUROLOGY FOLLOW UP OFFICE NOTE  Felicia Solis [id number]  Assessment/Plan:   1.  Migraine without aura, without status migrainosus, not intractable 2.  Vertigo, possibly migraine-associated.  She reports that her right ear sometimes feels clogged, raising possibility of eustachian tube dysfunction    For migraine prevention:  Start propranolol 29m BID.  If no improvement in 4 weeks, we can increase dose.  If experiences lightheadedness, would discontinue For migraine rescue:  May use Excedrin Migraine.  Limit use of pain relievers to no more than 2 days out of week to prevent risk of rebound or medication-overuse headache. For vertigo spells:  May use meclizine if absolutely needed (limit use) and Zofran for nausea Follow up in 6 months.       Subjective:  Felicia Solis a 56year old female who follows up for dizziness and headache.   UPDATE: Prescribed propranolol in June, which she never picked up.  She thought 491mtwice daily was too high of a dose.  She followed up in our clinic in November and prescribed lower dose of 2083mID.  She was referred to vestibular rehab.     Current NSAIDs:  ASA 31m49mily Current analgesics:  Tylenol; Excedrin Migraine (hasn't been able to find in the store, which works better than Tylenol). Current muscle relaxant:  Robaxin Current antihypertensive:  losartan Current antidepressant:  Citalopram 20mg34mrent antihistamine/decongestants:  Astelin; Flonase   HISTORY: In December 2020, she began experiencing dizziness described as spinning sensation.  It first occurred upon getting up after she was bent over in the kitchen to clean the floor.  However, it occurs spontaneously as well.  It would last a couple of minutes.  No visual disturbance, nausea, vomiting, photophobia, phonophobia, weakness, slurred speech or headache.  She also felt that her left ear was clogged up and would pop.  She initially went to her PCP who found cerumen impaction  which was removed and she was prescribed seasonal allergies and amoxicillin for possible otitis media.  However, she continued to have these spells.  She was evaluated by ENT, Dr. RosenConstance Holsterr exam was unremarkable and she reported no recent episodes.  She was advised to follow up if symptoms return.  However, the vertigo returned.  She also started developing aural fullness in her right ear as well (like she was on an airplane) associated with muffled hearing and aural fullness in right ear.  No tinnitus.  On one occasion, the episode was followed by a 7/10 right pressure-like retro-orbital headache lasting for about an hour.  Associated with photophobia and phonophobia but not nausea, vomiting or visual disturbance.  On occasion, she has a pressure on the top of her head.  Sometimes may feel disoriented prior to onset of headache.  She reports remote history of migraines.  Over the past year, she developed a right cervical radiculopathy with pain and numbness.  MRI of cervical spine from 12/24/2018 was personally reviewed and showed C6-7 disc extrusion with right-sided spinal stenosis and neural foraminal stenosis affecting the C7 nerve root.  Sometimes she feels disoriented.  Not with the headaches but may occur prior to a headache.    She had an MRI of brain without contrast on 09/09/2020  showed mild to moderate nonspecific changes in the cerebral white matter, likely chronic small vessel ischemic changes or sequela of migraine.     Past NSAIDs:  Diclofenac; meloxicam; naproxen Past analgesic:  Tylenol; tramadol Past anti-emetic:  Zofran 8mg P19m  muscle relaxant:  Flexeril Past antihypertensive:  HCTZ; lisinopril Past antidepressant:  Sertraline Past antiepileptic:  Gabapentin. Would avoid topiramate due to history of recurrent kidney stones. Past antihistamine/decongestant:  Claritin  PAST MEDICAL HISTORY: Past Medical History:  Diagnosis Date   Anemia    anemia  when pregnant in 2002   Anxiety     Arthritis    "knees and back" (09/28/2016)   Asthma 5 yrs ago    told she had asthma.. does not remember doctors name.    Chronic lower back pain    Depression    takes citalopram   History of hiatal hernia    History of kidney stones    Hyperlipidemia    Hypothyroidism    LBBB (left bundle branch block)    rate-related; identified on stress test 03/21/16   Migraine    "weekly" (09/28/2016)   Positive TB test 2008   pt states she took 9 months of medicine for positive tb skin test   Shortness of breath    with exertion   Thyroid disease    Type II diabetes mellitus ([place]) dx'd 2016    MEDICATIONS: Current Outpatient Medications on File Prior to Visit  Medication Sig Dispense Refill   acetaminophen (TYLENOL) 500 MG tablet Take 1,000 mg by mouth every 6 (six) hours as needed for mild pain. (Patient not taking: Reported on 08/01/2021)     aspirin EC 81 MG tablet Take 1 tablet (81 mg total) by mouth daily. (Patient not taking: Reported on 08/01/2021) 90 tablet 3   blood glucose meter kit and supplies KIT Dispense based on patient and insurance preference. Use up to four times daily as directed. (FOR ICD-9 250.00, 250.01). 1 each 0   celecoxib (CELEBREX) 100 MG capsule Take 1 capsule (100 mg total) by mouth daily. 30 capsule 2   Cholecalciferol (VITAMIN D3) 2000 units TABS Take 2,000 Units by mouth daily.      citalopram (CELEXA) 20 MG tablet Take 1 tablet by mouth once daily 90 tablet 1   cyanocobalamin 500 MCG tablet Take 1,000 mcg by mouth daily.      cyclobenzaprine (FLEXERIL) 10 MG tablet Take 1 tablet (10 mg total) by mouth 3 (three) times daily as needed for muscle spasms. 30 tablet 0   Flaxseed, Linseed, (FLAXSEED OIL PO) Take 1,300 mg by mouth daily.     fluticasone (FLONASE) 50 MCG/ACT nasal spray Place 2 sprays into both nostrils daily. 16 g 6   glimepiride (AMARYL) 2 MG tablet TAKE 1 TABLET BY MOUTH ONCE DAILY BEFORE BREAKFAST 90 tablet 1   glucose blood (ONETOUCH VERIO) test  strip Onetouch Verio Check blood sugar twice daily 100 each 12   indapamide (LOZOL) 1.25 MG tablet Take 1 tablet (1.25 mg total) by mouth daily. 90 tablet 3   levothyroxine (SYNTHROID) 88 MCG tablet TAKE 1 TABLET BY MOUTH ONCE DAILY BEFORE BREAKFAST 90 tablet 0   losartan (COZAAR) 50 MG tablet Take 1 tablet (50 mg total) by mouth daily. 90 tablet 1   meclizine (ANTIVERT) 25 MG tablet Take 1 tablet (25 mg total) by mouth 3 (three) times daily as needed for dizziness. 30 tablet 5   methocarbamol (ROBAXIN) 500 MG tablet Take 1 tablet (500 mg total) by mouth 4 (four) times daily. (Patient not taking: Reported on 08/01/2021) 45 tablet 1   Omega-3 Fatty Acids (FISH OIL) 1200 MG CAPS Take 1,200 mg by mouth daily.     ondansetron (ZOFRAN) 4 MG tablet Take 1 tablet (  4 mg total) by mouth every 8 (eight) hours as needed for nausea or vomiting. 20 tablet 5   OneTouch Delica Lancets 16B MISC 1 Device by Does not apply route 2 (two) times daily as needed. 60 each 5   pantoprazole (PROTONIX) 40 MG tablet Take 1 tablet (40 mg total) by mouth daily. 90 tablet 3   propranolol (INDERAL) 20 MG tablet Take 1 tablet (20 mg total) by mouth 2 (two) times daily. 120 tablet 3   rosuvastatin (CRESTOR) 20 MG tablet Take 1 tablet by mouth once daily 90 tablet 1   Semaglutide, 1 MG/DOSE, 4 MG/3ML SOPN Inject 1 mg as directed once a week. 3 mL 0   No current facility-administered medications on file prior to visit.    ALLERGIES: Allergies  Allergen Reactions   Lisinopril Cough    FAMILY HISTORY: Family History  Problem Relation Age of Onset   Diabetes Mother    Hypertension Mother    Hyperlipidemia Mother    Gallbladder disease Mother    Heart disease Mother    Diabetes Sister    Hypertension Father    Hyperlipidemia Father    Colon polyps Father    Ovarian cancer Maternal Grandmother    Gallbladder disease Maternal Grandmother    Breast cancer Maternal Grandmother 66   Colon cancer Paternal Grandfather     Breast cancer Maternal Aunt        late 68s   Heart disease Other        early cad   Diabetes Brother    Heart attack Brother    Anesthesia problems Neg Hx    Hypotension Neg Hx    Malignant hyperthermia Neg Hx    Pseudochol deficiency Neg Hx    Esophageal cancer Neg Hx       Objective:  *** General: No acute distress.  Patient appears ***-groomed.   Head:  Normocephalic/atraumatic Eyes:  Fundi examined but not visualized Neck: supple, no paraspinal tenderness, full range of motion Heart:  Regular rate and rhythm Lungs:  Clear to auscultation bilaterally Back: No paraspinal tenderness Neurological Exam: alert and oriented to person, place, and time.  Speech fluent and not dysarthric, language intact.  CN II-XII intact. Bulk and tone normal, muscle strength 5/5 throughout.  Sensation to light touch intact.  Deep tendon reflexes 2+ throughout, toes downgoing.  Finger to nose testing intact.  Gait normal, Romberg negative.   Metta Clines, DO  CC: ***

## 2021-08-02 NOTE — ED Triage Notes (Addendum)
Intermittent chest pain x 2 weeks.  Pt seen by PCP at that time and had workup but she has not heard back from them.  She continues to have chest pain which seems to be worsening over time.  Pt is concerned for blood clots

## 2021-08-02 NOTE — Discharge Instructions (Signed)
You were seen here today for evaluation of your episodic chest pain the past month.  Your lab work and imaging was normal.  I recommend that she continue using the medications your primary care provider prescribed you to see if these are value any relief.  I given you referral to cardiology group.  I advise you to follow-up with your PCP to let her know about your visit here and to discuss further investigation into your chest pain.  If you have any worsening chest pain, shortness of breath, nausea, vomiting, sweating, lightheadedness, please return to the nearest emergency department for evaluation.

## 2021-08-03 ENCOUNTER — Other Ambulatory Visit: Payer: Self-pay | Admitting: Family Medicine

## 2021-08-03 ENCOUNTER — Ambulatory Visit: Payer: Self-pay | Admitting: Neurology

## 2021-08-18 ENCOUNTER — Encounter: Payer: Self-pay | Admitting: Family Medicine

## 2021-08-18 ENCOUNTER — Ambulatory Visit (INDEPENDENT_AMBULATORY_CARE_PROVIDER_SITE_OTHER): Payer: Self-pay | Admitting: Family Medicine

## 2021-08-18 VITALS — BP 100/78 | HR 87 | Temp 99.0°F | Resp 18 | Ht 66.0 in | Wt 228.4 lb

## 2021-08-18 DIAGNOSIS — J0140 Acute pansinusitis, unspecified: Secondary | ICD-10-CM

## 2021-08-18 DIAGNOSIS — R6889 Other general symptoms and signs: Secondary | ICD-10-CM

## 2021-08-18 LAB — POC INFLUENZA A&B (BINAX/QUICKVUE)
Influenza A, POC: NEGATIVE
Influenza B, POC: NEGATIVE

## 2021-08-18 MED ORDER — PROMETHAZINE-DM 6.25-15 MG/5ML PO SYRP: 5.0000 mL | ORAL_SOLUTION | Freq: Four times a day (QID) | ORAL | 0 refills | Status: DC | PRN

## 2021-08-18 MED ORDER — FLUTICASONE PROPIONATE 50 MCG/ACT NA SUSP: 2.0000 | Freq: Every day | NASAL | 6 refills | Status: DC

## 2021-08-18 MED ORDER — AMOXICILLIN-POT CLAVULANATE 875-125 MG PO TABS: 1.0000 | ORAL_TABLET | Freq: Two times a day (BID) | ORAL | 0 refills | Status: DC

## 2021-08-18 NOTE — Patient Instructions (Signed)

## 2021-08-18 NOTE — Progress Notes (Addendum)
Subjective:   By signing my name below, I, Zite Okoli, attest that this documentation has been prepared under the direction and in the presence of Donato Schultz, DO. 08/18/2021    Patient ID: Felicia Solis, female    DOB: 1965-09-16, 56 y.o.   MRN: [id number]  Chief Complaint  Patient presents with   Flu like sxs    Sxs started Tuesday this week. Pt states having productive cough, congestion, body aches, no fever, slight headache. COVID negative yesterday    HPI Patient is in today for an office visit.  She is complaining of sneezing , cough, congestion, sinus pressure, rhinorrhea and chest pain that started on Tuesday. The body aches started yesterday. Denies fevers. Notes the coughing is worse at night when she is laying down. She has been using Zyrtec but provides little relief.  Flu test is negative.  Past Medical History:  Diagnosis Date   Anemia    anemia  when pregnant in 2002   Anxiety    Arthritis    "knees and back" (09/28/2016)   Asthma 5 yrs ago    told she had asthma.. does not remember doctors name.    Chronic lower back pain    Depression    takes citalopram   History of hiatal hernia    History of kidney stones    Hyperlipidemia    Hypothyroidism    LBBB (left bundle branch block)    rate-related; identified on stress test 03/21/16   Migraine    "weekly" (09/28/2016)   Positive TB test 2008   pt states she took 9 months of medicine for positive tb skin test   Shortness of breath    with exertion   Thyroid disease    Type II diabetes mellitus (HCC) dx'd 2016    Past Surgical History:  Procedure Laterality Date   DILATION AND CURETTAGE OF UTERUS  1999   ENDOMETRIAL ABLATION     EXTRACORPOREAL SHOCK WAVE LITHOTRIPSY Right 04/02/2019   Procedure: EXTRACORPOREAL SHOCK WAVE LITHOTRIPSY (ESWL);  Surgeon: Rene Paci, MD;  Location: [facility];  Service: Urology;  Laterality: Right;   LAPAROSCOPY     years ago ..pt does not know where  surgery was done ... states" my stomach would blow up then go down"   THYROIDECTOMY  09/28/2011   Procedure: THYROIDECTOMY;  Surgeon: [name], MD;  Location: [facility] OR;  Service: ENT;  Laterality: Right;  RIGHT THYROIDECTOMY WITH FROZEN SECTION   THYROIDECTOMY  09/28/2016   completion of thyroidectomy/notes 09/28/2016   THYROIDECTOMY Left 09/28/2016   Procedure: COMPLETION OF THYROIDECTOMY;  Surgeon: [name], MD;  Location: [facility] OR;  Service: ENT;  Laterality: Left;   TUBAL LIGATION      Family History  Problem Relation Age of Onset   Diabetes Mother    Hypertension Mother    Hyperlipidemia Mother    Gallbladder disease Mother    Heart disease Mother    Diabetes Sister    Hypertension Father    Hyperlipidemia Father    Colon polyps Father    Ovarian cancer Maternal Grandmother    Gallbladder disease Maternal Grandmother    Breast cancer Maternal Grandmother 31   Colon cancer Paternal Grandfather    Breast cancer Maternal Aunt        late 31s   Heart disease Other        early cad   Diabetes Brother    Heart attack Brother    Anesthesia problems Neg  Hx    Hypotension Neg Hx    Malignant hyperthermia Neg Hx    Pseudochol deficiency Neg Hx    Esophageal cancer Neg Hx     Social History   Socioeconomic History   Marital status: Married    Spouse name: Not on file   Number of children: 3   Years of education: Not on file   Highest education level: Not on file  Occupational History   Occupation: CNA  Tobacco Use   Smoking status: Never   Smokeless tobacco: Never  Vaping Use   Vaping Use: Never used  Substance and Sexual Activity   Alcohol use: No    Alcohol/week: 0.0 standard drinks   Drug use: No   Sexual activity: Not on file  Other Topics Concern   Not on file  Social History Narrative   No exercise--  Walking and lifting pts at work   Right handed   Lives in single story home   Social Determinants of Health   Financial Resource Strain: Not on file  Food  Insecurity: Not on file  Transportation Needs: Not on file  Physical Activity: Not on file  Stress: Not on file  Social Connections: Not on file  Intimate Partner Violence: Not on file    Outpatient Medications Prior to Visit  Medication Sig Dispense Refill   blood glucose meter kit and supplies KIT Dispense based on patient and insurance preference. Use up to four times daily as directed. (FOR ICD-9 250.00, 250.01). 1 each 0   celecoxib (CELEBREX) 100 MG capsule Take 1 capsule (100 mg total) by mouth daily. 30 capsule 2   Cholecalciferol (VITAMIN D3) 2000 units TABS Take 2,000 Units by mouth daily.      citalopram (CELEXA) 20 MG tablet Take 1 tablet by mouth once daily 90 tablet 1   cyanocobalamin 500 MCG tablet Take 1,000 mcg by mouth daily.      cyclobenzaprine (FLEXERIL) 10 MG tablet Take 1 tablet (10 mg total) by mouth 3 (three) times daily as needed for muscle spasms. 30 tablet 0   Flaxseed, Linseed, (FLAXSEED OIL PO) Take 1,300 mg by mouth daily.     fluticasone (FLONASE) 50 MCG/ACT nasal spray Place 2 sprays into both nostrils daily. 16 g 6   glimepiride (AMARYL) 2 MG tablet TAKE 1 TABLET BY MOUTH ONCE DAILY BEFORE BREAKFAST 90 tablet 1   glucose blood (ONETOUCH VERIO) test strip Onetouch Verio Check blood sugar twice daily 100 each 12   indapamide (LOZOL) 1.25 MG tablet Take 1 tablet (1.25 mg total) by mouth daily. 90 tablet 3   levothyroxine (SYNTHROID) 88 MCG tablet Take 1 tablet (88 mcg total) by mouth daily before breakfast. 90 tablet 0   losartan (COZAAR) 50 MG tablet Take 1 tablet (50 mg total) by mouth daily. 90 tablet 1   meclizine (ANTIVERT) 25 MG tablet Take 1 tablet (25 mg total) by mouth 3 (three) times daily as needed for dizziness. 30 tablet 5   Omega-3 Fatty Acids (FISH OIL) 1200 MG CAPS Take 1,200 mg by mouth daily.     ondansetron (ZOFRAN) 4 MG tablet Take 1 tablet (4 mg total) by mouth every 8 (eight) hours as needed for nausea or vomiting. 20 tablet 5   OneTouch  Delica Lancets 33A MISC 1 Device by Does not apply route 2 (two) times daily as needed. 60 each 5   pantoprazole (PROTONIX) 40 MG tablet Take 1 tablet (40 mg total) by mouth daily. 90 tablet 3  propranolol (INDERAL) 20 MG tablet Take 1 tablet (20 mg total) by mouth 2 (two) times daily. 120 tablet 3   rosuvastatin (CRESTOR) 20 MG tablet Take 1 tablet by mouth once daily 90 tablet 1   Semaglutide, 1 MG/DOSE, 4 MG/3ML SOPN Inject 1 mg as directed once a week. 3 mL 0   acetaminophen (TYLENOL) 500 MG tablet Take 1,000 mg by mouth every 6 (six) hours as needed for mild pain. (Patient not taking: Reported on 08/01/2021)     aspirin EC 81 MG tablet Take 1 tablet (81 mg total) by mouth daily. (Patient not taking: Reported on 08/01/2021) 90 tablet 3   methocarbamol (ROBAXIN) 500 MG tablet Take 1 tablet (500 mg total) by mouth 4 (four) times daily. (Patient not taking: Reported on 08/01/2021) 45 tablet 1   No facility-administered medications prior to visit.    Allergies  Allergen Reactions   Lisinopril Cough    Review of Systems  Constitutional:  Negative for fever.  HENT:  Positive for congestion and sinus pain. Negative for ear pain, hearing loss and sore throat.        (+) rhinorrhea  (+) sinus pressure   Eyes:  Negative for blurred vision and pain.  Respiratory:  Positive for cough. Negative for sputum production, shortness of breath and wheezing.        (+) sneezing  Cardiovascular:  Positive for chest pain. Negative for palpitations.  Gastrointestinal:  Negative for blood in stool, constipation, diarrhea, nausea and vomiting.  Genitourinary:  Negative for dysuria, frequency, hematuria and urgency.  Musculoskeletal:  Positive for myalgias. Negative for back pain and falls.  Neurological:  Negative for dizziness, sensory change, loss of consciousness, weakness and headaches.  Endo/Heme/Allergies:  Negative for environmental allergies. Does not bruise/bleed easily.  Psychiatric/Behavioral:   Negative for depression and suicidal ideas. The patient is not nervous/anxious and does not have insomnia.       Objective:    Physical Exam Constitutional:      General: She is not in acute distress.    Appearance: Normal appearance. She is not ill-appearing.  HENT:     Head: Normocephalic and atraumatic.     Right Ear: External ear normal.     Left Ear: External ear normal.  Eyes:     Extraocular Movements: Extraocular movements intact.     Pupils: Pupils are equal, round, and reactive to light.  Cardiovascular:     Rate and Rhythm: Normal rate and regular rhythm.     Pulses: Normal pulses.     Heart sounds: Normal heart sounds. No murmur heard.   No gallop.  Pulmonary:     Effort: Pulmonary effort is normal. No respiratory distress.     Breath sounds: Normal breath sounds. No wheezing, rhonchi or rales.  Abdominal:     General: Bowel sounds are normal. There is no distension.     Palpations: Abdomen is soft. There is no mass.     Tenderness: There is no abdominal tenderness. There is no guarding or rebound.     Hernia: No hernia is present.  Musculoskeletal:     Cervical back: Normal range of motion and neck supple.  Lymphadenopathy:     Cervical: No cervical adenopathy.  Skin:    General: Skin is warm and dry.  Neurological:     Mental Status: She is alert and oriented to person, place, and time.  Psychiatric:        Behavior: Behavior normal.    BP 100/78 (BP  Location: Right Arm, Patient Position: Sitting, Cuff Size: Large)    Pulse 87    Temp 99 F (37.2 C) (Oral)    Resp 18    Ht 5\' 6"  (1.676 m)    Wt 228 lb 6.4 oz (103.6 kg)    SpO2 98%    BMI 36.86 kg/m  Wt Readings from Last 3 Encounters:  08/18/21 228 lb 6.4 oz (103.6 kg)  08/02/21 227 lb (103 kg)  08/01/21 227 lb (103 kg)    Diabetic Foot Exam - Simple   No data filed    Lab Results  Component Value Date   WBC 8.0 08/02/2021   HGB 13.1 08/02/2021   HCT 40.0 08/02/2021   PLT 257 08/02/2021    GLUCOSE 106 (H) 08/02/2021   CHOL 112 08/19/2020   TRIG 123 08/19/2020   HDL 38 (L) 08/19/2020   LDLCALC 54 08/19/2020   ALT 22 07/11/2021   AST 22 07/11/2021   NA 138 08/02/2021   K 3.8 08/02/2021   CL 101 08/02/2021   CREATININE 0.64 08/02/2021   BUN 11 08/02/2021   CO2 27 08/02/2021   TSH 2.39 07/11/2021   HGBA1C 9.8 (H) 08/19/2020   MICROALBUR 13.7 08/19/2020    Lab Results  Component Value Date   TSH 2.39 07/11/2021   Lab Results  Component Value Date   WBC 8.0 08/02/2021   HGB 13.1 08/02/2021   HCT 40.0 08/02/2021   MCV 86.0 08/02/2021   PLT 257 08/02/2021   Lab Results  Component Value Date   NA 138 08/02/2021   K 3.8 08/02/2021   CO2 27 08/02/2021   GLUCOSE 106 (H) 08/02/2021   BUN 11 08/02/2021   CREATININE 0.64 08/02/2021   BILITOT 0.5 07/11/2021   ALKPHOS 36 (L) 07/11/2021   AST 22 07/11/2021   ALT 22 07/11/2021   PROT 7.6 07/11/2021   ALBUMIN 4.6 07/11/2021   CALCIUM 9.2 08/02/2021   ANIONGAP 10 08/02/2021   GFR 101.79 07/11/2021   Lab Results  Component Value Date   CHOL 112 08/19/2020   Lab Results  Component Value Date   HDL 38 (L) 08/19/2020   Lab Results  Component Value Date   LDLCALC 54 08/19/2020   Lab Results  Component Value Date   TRIG 123 08/19/2020   Lab Results  Component Value Date   CHOLHDL 2.9 08/19/2020   Lab Results  Component Value Date   HGBA1C 9.8 (H) 08/19/2020       Assessment & Plan:   Problem List Items Addressed This Visit       Unprioritized   Pansinusitis    flonase , antihistamine abx per orders Call or rto prn        Relevant Medications   amoxicillin-clavulanate (AUGMENTIN) 875-125 MG tablet   fluticasone (FLONASE) 50 MCG/ACT nasal spray   promethazine-dextromethorphan (PROMETHAZINE-DM) 6.25-15 MG/5ML syrup   Other Visit Diagnoses     Flu-like symptoms    -  Primary   Relevant Orders   POC Influenza A&B (Binax test) (Completed)        Meds ordered this encounter   Medications   amoxicillin-clavulanate (AUGMENTIN) 875-125 MG tablet    Sig: Take 1 tablet by mouth 2 (two) times daily.    Dispense:  20 tablet    Refill:  0   fluticasone (FLONASE) 50 MCG/ACT nasal spray    Sig: Place 2 sprays into both nostrils daily.    Dispense:  16 g    Refill:  6   promethazine-dextromethorphan (PROMETHAZINE-DM) 6.25-15 MG/5ML syrup    Sig: Take 5 mLs by mouth 4 (four) times daily as needed.    Dispense:  118 mL    Refill:  0    I,Zite Okoli,acting as a scribe for [employer], DO.,have documented all relevant documentation on the behalf of [name], DO,as directed by  [name], DO while in the presence of [name], DO.   I, [name], DO. , personally preformed the services described in this documentation.  All medical record entries made by the scribe were at my direction and in my presence.  I have reviewed the chart and discharge instructions (if applicable) and agree that the record reflects my personal performance and is accurate and complete. 08/18/2021

## 2021-08-20 NOTE — Assessment & Plan Note (Signed)
flonase , antihistamine abx per orders Call or rto prn

## 2021-09-04 ENCOUNTER — Other Ambulatory Visit: Payer: Self-pay | Admitting: Family Medicine

## 2021-09-04 DIAGNOSIS — R921 Mammographic calcification found on diagnostic imaging of breast: Secondary | ICD-10-CM

## 2021-09-21 ENCOUNTER — Other Ambulatory Visit: Payer: Self-pay | Admitting: Family Medicine

## 2021-09-21 DIAGNOSIS — K219 Gastro-esophageal reflux disease without esophagitis: Secondary | ICD-10-CM

## 2021-10-08 ENCOUNTER — Other Ambulatory Visit: Payer: Self-pay | Admitting: Family Medicine

## 2021-10-12 ENCOUNTER — Telehealth: Payer: Self-pay | Admitting: *Deleted

## 2021-10-12 NOTE — Telephone Encounter (Signed)
Patient called to ask about her renewal for ozempic.  She is due for renewal.  Paperwork printed for her to come pickup and she will bring back tomorrow when her husband come in for his appointment. ?

## 2021-10-27 ENCOUNTER — Telehealth: Payer: Self-pay | Admitting: *Deleted

## 2021-10-27 NOTE — Telephone Encounter (Signed)
Re-enrollment form faxed to [place] today to [employer] PAP. ?

## 2021-11-23 ENCOUNTER — Encounter: Payer: Self-pay | Admitting: Family Medicine

## 2021-11-23 ENCOUNTER — Ambulatory Visit (INDEPENDENT_AMBULATORY_CARE_PROVIDER_SITE_OTHER): Payer: Self-pay | Admitting: Family Medicine

## 2021-11-23 VITALS — BP 132/81 | HR 78 | Temp 98.3°F | Ht 66.0 in | Wt 228.4 lb

## 2021-11-23 DIAGNOSIS — J0140 Acute pansinusitis, unspecified: Secondary | ICD-10-CM

## 2021-11-23 MED ORDER — PREDNISONE 20 MG PO TABS: 40.0000 mg | ORAL_TABLET | Freq: Every day | ORAL | 0 refills | Status: AC

## 2021-11-23 NOTE — Progress Notes (Signed)
Chief Complaint  Patient presents with   Cough    Headache Chills Sick since Tuesday morning     Felicia Solis here for URI complaints.  Duration: 3 days  Associated symptoms: subjective fever, sinus headache, sinus pain, rhinorrhea, ear pain, sore throat, myalgia, and cough Denies: sinus congestion, itchy watery eyes, ear drainage, wheezing, shortness of breath, and N/V/D Treatment to date: Ibuprofen Sick contacts: No Tested neg for covid 2 d ago.   Past Medical History:  Diagnosis Date   Anemia    anemia  when pregnant in 2002   Anxiety    Arthritis    "knees and back" (09/28/2016)   Asthma 5 yrs ago    told she had asthma.. does not remember doctors name.    Chronic lower back pain    Depression    takes citalopram   History of hiatal hernia    History of kidney stones    Hyperlipidemia    Hypothyroidism    LBBB (left bundle branch block)    rate-related; identified on stress test 03/21/16   Migraine    "weekly" (09/28/2016)   Positive TB test 2008   pt states she took 9 months of medicine for positive tb skin test   Shortness of breath    with exertion   Thyroid disease    Type II diabetes mellitus ([place]) dx'd 2016    Objective BP 132/81   Pulse 78   Temp 98.3 F (36.8 C) (Oral)   Ht '5\' 6"'$  (1.676 m)   Wt 228 lb 6 oz (103.6 kg)   SpO2 98%   BMI 36.86 kg/m  General: Awake, alert, appears stated age HEENT: AT, [place], ears patent on L, 80% obstructed w cerumen on R, and TM's neg, nares patent w/o discharge, pharynx pink and without exudates, MMM, ttp over R max and frontal sinus Neck: No masses or asymmetry Heart: RRR Lungs: CTAB, no accessory muscle use Psych: Age appropriate judgment and insight, normal mood and affect  Acute pansinusitis, recurrence not specified - Plan: predniSONE (DELTASONE) 20 MG tablet  5 d pred burst 40 mg/d. Will send message over weekend if not improving. Continue to push fluids, practice good hand hygiene, cover mouth when coughing.   AVS care for ear wax removal for wax buildup. Stop using Q tips.  F/u prn. If starting to experience fevers, shaking, or shortness of breath, seek immediate care. Pt voiced understanding and agreement to the plan.  [place], DO 11/23/21 4:16 PM

## 2021-11-23 NOTE — Patient Instructions (Addendum)
Continue to push fluids, practice good hand hygiene, and cover your mouth if you cough.  If you start having fevers, shaking or shortness of breath, seek immediate care.  OK to take Tylenol 1000 mg (2 extra strength tabs) or 975 mg (3 regular strength tabs) every 6 hours as needed.  OK to use Debrox (peroxide) in the ear to loosen up wax. Also recommend using a bulb syringe (for removing boogers from baby's noses) to flush through warm water and vinegar (3-4:1 ratio). An alternative, though more expensive, is an elephant ear washer wax removal kit. Do not use Q-tips as this can impact wax further.  Take 2 tabs of the glimepiride daily for the next 5 days while on the prednisone. Send me a message over the weekend if no better.   Consider taking Flonase (or other intranasal steroid like Nasonex, etc) to prevent this.   Let us know if you need anything.

## 2021-11-27 ENCOUNTER — Telehealth: Payer: Self-pay | Admitting: Family Medicine

## 2021-11-27 ENCOUNTER — Other Ambulatory Visit: Payer: Self-pay | Admitting: Family Medicine

## 2021-11-27 DIAGNOSIS — J0140 Acute pansinusitis, unspecified: Secondary | ICD-10-CM

## 2021-11-27 MED ORDER — PROMETHAZINE-DM 6.25-15 MG/5ML PO SYRP: 5.0000 mL | ORAL_SOLUTION | Freq: Four times a day (QID) | ORAL | 0 refills | Status: DC | PRN

## 2021-11-27 MED ORDER — AMOXICILLIN-POT CLAVULANATE 875-125 MG PO TABS: 1.0000 | ORAL_TABLET | Freq: Two times a day (BID) | ORAL | 0 refills | Status: DC

## 2021-11-27 NOTE — Telephone Encounter (Signed)
Pt saw Dr.Wendling last week and is still experiencing a cough and was wondering if pcp could prescribe something to help. She stated she was given something for her cough when she had covid but that did not help and it would need to be something else. Please advise.   [place], [address], [place] [address]  Phone:  [phone]  Fax:  [phone]

## 2021-11-28 NOTE — Telephone Encounter (Signed)
Pt called. VM left 

## 2021-12-25 ENCOUNTER — Telehealth: Payer: Self-pay

## 2021-12-25 NOTE — Telephone Encounter (Signed)
Patient notified and will be Wednesday to resend in new application

## 2021-12-25 NOTE — Telephone Encounter (Signed)
Pt called in states that she would like you to call her about her ozempic.

## 2021-12-26 ENCOUNTER — Other Ambulatory Visit: Payer: Self-pay | Admitting: Family Medicine

## 2021-12-26 DIAGNOSIS — F411 Generalized anxiety disorder: Secondary | ICD-10-CM

## 2022-01-01 NOTE — Telephone Encounter (Signed)
Per [employer] paperwork was received and they are will have an answer in about 48-72 hours if approved or not.

## 2022-01-05 NOTE — Telephone Encounter (Signed)
Received fax letter of approval until 12/29/22.  Will await for medication to arrive.  Should be 10-14 business days.

## 2022-01-12 NOTE — Telephone Encounter (Signed)
Pt called to follow up on progress. She was advised that we had received the letter of approval and the time frame of when the medication should be received. Pt acknowledged understanding.

## 2022-01-14 ENCOUNTER — Other Ambulatory Visit: Payer: Self-pay | Admitting: Family Medicine

## 2022-01-22 ENCOUNTER — Telehealth: Payer: Self-pay

## 2022-01-22 NOTE — Telephone Encounter (Signed)
Pt via telephone [place] with patient.  Pt income excess federal poverty guideliness for [employer] program  income with spouse 66,000 income for 2 at household [phone]

## 2022-01-24 ENCOUNTER — Telehealth: Payer: Self-pay

## 2022-01-24 NOTE — Telephone Encounter (Signed)
Pt made aware that medication has arrived. Meds placed in frig near [place].

## 2022-01-29 ENCOUNTER — Other Ambulatory Visit: Payer: Self-pay | Admitting: Family Medicine

## 2022-01-29 DIAGNOSIS — K219 Gastro-esophageal reflux disease without esophagitis: Secondary | ICD-10-CM

## 2022-02-15 ENCOUNTER — Ambulatory Visit: Payer: Self-pay

## 2022-02-15 ENCOUNTER — Encounter: Payer: Self-pay | Admitting: Family Medicine

## 2022-02-15 ENCOUNTER — Ambulatory Visit (INDEPENDENT_AMBULATORY_CARE_PROVIDER_SITE_OTHER): Payer: Self-pay | Admitting: Family Medicine

## 2022-02-15 VITALS — BP 120/82 | HR 76 | Temp 98.1°F | Resp 18 | Ht 66.0 in | Wt 224.0 lb

## 2022-02-15 DIAGNOSIS — H6121 Impacted cerumen, right ear: Secondary | ICD-10-CM | POA: Insufficient documentation

## 2022-02-15 DIAGNOSIS — H60391 Other infective otitis externa, right ear: Secondary | ICD-10-CM

## 2022-02-15 DIAGNOSIS — H6090 Unspecified otitis externa, unspecified ear: Secondary | ICD-10-CM | POA: Insufficient documentation

## 2022-02-15 DIAGNOSIS — H6501 Acute serous otitis media, right ear: Secondary | ICD-10-CM

## 2022-02-15 MED ORDER — OFLOXACIN 0.3 % OT SOLN: 10.0000 [drp] | Freq: Every day | OTIC | 0 refills | Status: DC

## 2022-02-15 NOTE — Assessment & Plan Note (Signed)
floxin otic gtts rto if pain and dizziness do not resolve

## 2022-02-15 NOTE — Assessment & Plan Note (Signed)
Irrigated successfully  Use debrox prn  rto prn for cerumen removal

## 2022-02-15 NOTE — Progress Notes (Signed)
Established Patient Office Visit  Subjective   Patient ID: Felicia Solis, female    DOB: 1965-08-03  Age: 56 y.o. MRN: [id number]  Chief Complaint  Patient presents with   Ear Pain    Right ear, pt states still having pain and dizziness.    Headache    HPI Pt is here c/o R ear feeling full.  No other complaints   Patient Active Problem List   Diagnosis Date Noted   Impacted cerumen of right ear 02/15/2022   Otitis externa 02/15/2022   Other spondylosis with radiculopathy, cervical region 08/01/2021   Neck pain 07/11/2021   Skin tag 12/18/2020   Gastroenteritis 12/06/2020   COVID-19 07/03/2020   TMJ (sprain of temporomandibular joint), initial encounter 07/03/2020   Uncontrolled type 2 diabetes mellitus with hyperglycemia ([place]) 02/25/2020   Trapezius muscle strain, left, initial encounter 08/17/2019   Left wrist pain 08/11/2019   Acute pain of left shoulder 08/11/2019   Acute nonintractable headache 08/11/2019   De Quervain's tenosynovitis, left 08/11/2019   Vertigo 06/09/2019   Right acute serous otitis media 06/09/2019   Impacted cerumen of left ear 06/09/2019   Pelvic pain 02/23/2019   HNP (herniated nucleus pulposus), cervical 01/04/2019   Epigastric mass 10/21/2018   Myalgia 03/18/2018   Hyperlipidemia associated with type 2 diabetes mellitus ([place]) 03/18/2018   Nausea and vomiting 12/02/2017   Hematuria 12/02/2017   Pansinusitis 11/19/2017   Agoraphobia with panic attacks 11/19/2017   Preventative health care 09/27/2017   Hyperlipidemia LDL goal <100 09/18/2017   Vitamin D deficiency 09/18/2017   Hypothyroidism 09/18/2017   Other intervertebral disc degeneration, lumbar region 04/07/2017   Left hip pain 04/07/2017   Hyperlipidemia LDL goal <70 04/07/2017   Essential hypertension 04/07/2017   Acute vaginitis 04/07/2017   S/P complete thyroidectomy 09/28/2016   Memory loss 08/23/2016   Thyroid nodule 08/02/2016   Plantar fasciitis of right foot 12/01/2015    Knee pain, right 12/01/2015   Headache, migraine 09/06/2015   Bilateral knee pain 08/22/2015   Thoracic myofascial strain 08/22/2015   Dysphagia 04/10/2015   Chest pain 04/10/2015   Epigastric pain 04/10/2015   Functional diarrhea 04/10/2015   NSAID long-term use 04/10/2015   Midepigastric pain 03/22/2015   Cough 01/07/2015   Allergic rhinitis 12/13/2014   Acute bronchitis 12/13/2014   Blood in stool 10/01/2014   DM (diabetes mellitus) type II uncontrolled, periph vascular disorder 08/13/2014   Abnormal ECG 05/14/2012   Breast pain 10/23/2011   ASCUS on Pap smear 10/08/2011   H/O thyroid nodule 08/27/2011   Depression with anxiety 08/27/2011   Heavy periods 08/27/2011   Past Medical History:  Diagnosis Date   Anemia    anemia  when pregnant in 2002   Anxiety    Arthritis    "knees and back" (09/28/2016)   Asthma 5 yrs ago    told she had asthma.. does not remember doctors name.    Chronic lower back pain    Depression    takes citalopram   History of hiatal hernia    History of kidney stones    Hyperlipidemia    Hypothyroidism    LBBB (left bundle branch block)    rate-related; identified on stress test 03/21/16   Migraine    "weekly" (09/28/2016)   Positive TB test 2008   pt states she took 9 months of medicine for positive tb skin test   Shortness of breath    with exertion   Thyroid disease  Type II diabetes mellitus ([place]) dx'd 2016   Past Surgical History:  Procedure Laterality Date   DILATION AND CURETTAGE OF UTERUS  1999   ENDOMETRIAL ABLATION     EXTRACORPOREAL SHOCK WAVE LITHOTRIPSY Right 04/02/2019   Procedure: EXTRACORPOREAL SHOCK WAVE LITHOTRIPSY (ESWL);  Surgeon: Ceasar Mons, MD;  Location: [facility];  Service: Urology;  Laterality: Right;   LAPAROSCOPY     years ago ..pt does not know where surgery was done ... states" my stomach would blow up then go down"   THYROIDECTOMY  09/28/2011   Procedure: THYROIDECTOMY;  Surgeon: Izora Gala, MD;   Location: [facility];  Service: ENT;  Laterality: Right;  RIGHT THYROIDECTOMY WITH FROZEN SECTION   THYROIDECTOMY  09/28/2016   completion of thyroidectomy/notes 09/28/2016   THYROIDECTOMY Left 09/28/2016   Procedure: COMPLETION OF THYROIDECTOMY;  Surgeon: Izora Gala, MD;  Location: [facility];  Service: ENT;  Laterality: Left;   TUBAL LIGATION     Social History   Tobacco Use   Smoking status: Never   Smokeless tobacco: Never  Vaping Use   Vaping Use: Never used  Substance Use Topics   Alcohol use: No    Alcohol/week: 0.0 standard drinks of alcohol   Drug use: No   Social History   Socioeconomic History   Marital status: Married    Spouse name: Not on file   Number of children: 3   Years of education: Not on file   Highest education level: Not on file  Occupational History   Occupation: CNA  Tobacco Use   Smoking status: Never   Smokeless tobacco: Never  Vaping Use   Vaping Use: Never used  Substance and Sexual Activity   Alcohol use: No    Alcohol/week: 0.0 standard drinks of alcohol   Drug use: No   Sexual activity: Not on file  Other Topics Concern   Not on file  Social History Narrative   No exercise--  Walking and lifting pts at work   Right handed   Lives in single story home   Social Determinants of Health   Financial Resource Strain: Not on file  Food Insecurity: Not on file  Transportation Needs: Not on file  Physical Activity: Not on file  Stress: Not on file  Social Connections: Not on file  Intimate Partner Violence: Not on file   Family Status  Relation Name Status   Mother  Alive   Sister  Alive   Father  Alive   MGM  (Not Specified)   PGF  (Not Specified)   Mat Aunt  (Not Specified)   Other  (Not Specified)   Brother  Alive   Neg Hx  (Not Specified)   Family History  Problem Relation Age of Onset   Diabetes Mother    Hypertension Mother    Hyperlipidemia Mother    Gallbladder disease Mother    Heart disease Mother    Diabetes Sister     Hypertension Father    Hyperlipidemia Father    Colon polyps Father    Ovarian cancer Maternal Grandmother    Gallbladder disease Maternal Grandmother    Breast cancer Maternal Grandmother 1   Colon cancer Paternal Grandfather    Breast cancer Maternal Aunt        late 71s   Heart disease Other        early cad   Diabetes Brother    Heart attack Brother    Anesthesia problems Neg Hx    Hypotension Neg  Hx    Malignant hyperthermia Neg Hx    Pseudochol deficiency Neg Hx    Esophageal cancer Neg Hx    Allergies  Allergen Reactions   Lisinopril Cough      Review of Systems  Constitutional:  Negative for fever and malaise/fatigue.  HENT:  Positive for ear pain. Negative for congestion.   Eyes:  Negative for blurred vision.  Respiratory:  Negative for cough and shortness of breath.   Cardiovascular:  Negative for chest pain, palpitations and leg swelling.  Gastrointestinal:  Negative for abdominal pain, blood in stool, nausea and vomiting.  Genitourinary:  Negative for dysuria and frequency.  Musculoskeletal:  Negative for back pain and falls.  Skin:  Negative for rash.  Neurological:  Negative for dizziness, loss of consciousness and headaches.  Endo/Heme/Allergies:  Negative for environmental allergies.  Psychiatric/Behavioral:  Negative for depression. The patient is not nervous/anxious.       Objective:     BP 120/82 (BP Location: Left Arm, Patient Position: Sitting, Cuff Size: Normal)   Pulse 76   Temp 98.1 F (36.7 C) (Oral)   Resp 18   Ht '5\' 6"'$  (1.676 m)   Wt 224 lb (101.6 kg)   SpO2 98%   BMI 36.15 kg/m    Physical Exam Vitals and nursing note reviewed.  Constitutional:      Appearance: She is well-developed.  HENT:     Head: Normocephalic and atraumatic.     Right Ear: There is impacted cerumen.     Left Ear: No decreased hearing noted. No tenderness.  No middle ear effusion. There is no impacted cerumen. No foreign body. No mastoid tenderness.      Ears:     Comments: Unable to remove wax from R ear with hoop Irrigated with pt permission and no complications  Eyes:     Conjunctiva/sclera: Conjunctivae normal.  Neck:     Thyroid: No thyromegaly.     Vascular: No carotid bruit or JVD.  Cardiovascular:     Rate and Rhythm: Normal rate and regular rhythm.     Heart sounds: Normal heart sounds. No murmur heard. Pulmonary:     Effort: Pulmonary effort is normal. No respiratory distress.     Breath sounds: Normal breath sounds. No wheezing or rales.  Chest:     Chest wall: No tenderness.  Musculoskeletal:     Cervical back: Normal range of motion and neck supple.  Neurological:     Mental Status: She is alert and oriented to person, place, and time.     No results found for any visits on 02/15/22.    The ASCVD Risk score (Arnett DK, et al., 2019) failed to calculate for the following reasons:   The valid total cholesterol range is 130 to 320 mg/dL    Assessment & Plan:   Problem List Items Addressed This Visit       Unprioritized   Right acute serous otitis media    floxin otic gtts rto if pain and dizziness do not resolve       Otitis externa - Primary   Relevant Medications   ofloxacin (FLOXIN OTIC) 0.3 % OTIC solution   Impacted cerumen of right ear    Irrigated successfully  Use debrox prn  rto prn for cerumen removal        Return if symptoms worsen or fail to improve.    [name], DO

## 2022-02-15 NOTE — Patient Instructions (Signed)

## 2022-02-23 NOTE — Progress Notes (Unsigned)
NEUROLOGY FOLLOW UP OFFICE NOTE  [name] [id number]  Assessment/Plan:   1.  Migraine without aura, without status migrainosus, not intractable 2.  Vertigo, possibly migraine-associated.  She reports that her right ear sometimes feels clogged, raising possibility of eustachian tube dysfunction.  Also consider cervicogenic dizziness.      For migraine prevention:  Start propranolol 40mg  BID.  If no improvement in 4 weeks, we can increase dose.  If experiences lightheadedness, would discontinue For migraine rescue:  May use Excedrin Migraine.  Limit use of pain relievers to no more than 2 days out of week to prevent risk of rebound or medication-overuse headache. For vertigo spells:  May use meclizine if absolutely needed (limit use) and Zofran for nausea Follow up in 6 months.       Subjective:  Felicia Solis. Marston is a 56 year old female who follows up for dizziness and headache.   UPDATE: For migraine prophylaxis, she was prescribed propranolol 40mg  BID.  For vertigo, she was prescribed meclizine and zofran.   Propranolol 40mg  BID - Excedrin Meclizne zofran   Current NSAIDs:  ASA 81mg  daily Current analgesics:  Tylenol; Excedrin Migraine (hasn't been able to find in the store, which works better than Tylenol). Current muscle relaxant:  Robaxin Current antihypertensive:  Losartan Current antidepressant:  Citalopram 20mg  Current antihistamine/decongestants:  Astelin; Flonase   HISTORY: In December 2020, she began experiencing dizziness described as spinning sensation.  It first occurred upon getting up after she was bent over in the kitchen to clean the floor.  However, it occurs spontaneously as well.  It would last a couple of minutes.  No visual disturbance, nausea, vomiting, photophobia, phonophobia, weakness, slurred speech or headache.  She also felt that her left ear was clogged up and would pop.  She initially went to her PCP who found cerumen impaction which was removed  and she was prescribed seasonal allergies and amoxicillin for possible otitis media.  However, she continued to have these spells.  She was evaluated by ENT, Dr. Constance Holster.  Her exam was unremarkable and she reported no recent episodes.  She was advised to follow up if symptoms return.  However, the vertigo returned.  She also started developing aural fullness in her right ear as well (like she was on an airplane) associated with muffled hearing.  No tinnitus.  On one occasion, the episode was followed by a 7/10 right pressure-like retro-orbital headache lasting for about an hour.  Associated with photophobia and phonophobia but not nausea, vomiting or visual disturbance.  On occasion, she has a pressure on the top of her head. Sometimes she feels disoriented.  Not with the headaches but may occur prior to a headache.  She reports remote history of migraines.  She had an MRI of brain without contrast on 09/09/2020 showed mild to moderate nonspecific changes in the cerebral white matter, likely chronic small vessel ischemic changes or sequela of migraine.    In 2020,  she developed a right cervical radiculopathy with pain and numbness.  MRI of cervical spine from 12/24/2018 was personally reviewed and showed C6-7 disc extrusion with right-sided spinal stenosis and neural foraminal stenosis affecting the C7 nerve root.         Past NSAIDs:  Diclofenac; meloxicam; naproxen Past analgesic:  Tylenol; tramadol Past anti-emetic:  Zofran 8mg  Past muscle relaxant:  Flexeril Past antihypertensive:  HCTZ; lisinopril Past antidepressant:  Sertraline Past antiepileptic:  Gabapentin. Would avoid topiramate due to history of recurrent kidney stones.  Past antihistamine/decongestant:  Claritin  PAST MEDICAL HISTORY: Past Medical History:  Diagnosis Date   Anemia    anemia  when pregnant in 2002   Anxiety    Arthritis    "knees and back" (09/28/2016)   Asthma 5 yrs ago    told she had asthma.. does not remember doctors  name.    Chronic lower back pain    Depression    takes citalopram   History of hiatal hernia    History of kidney stones    Hyperlipidemia    Hypothyroidism    LBBB (left bundle branch block)    rate-related; identified on stress test 03/21/16   Migraine    "weekly" (09/28/2016)   Positive TB test 2008   pt states she took 9 months of medicine for positive tb skin test   Shortness of breath    with exertion   Thyroid disease    Type II diabetes mellitus ([place]) dx'd 2016    MEDICATIONS: Current Outpatient Medications on File Prior to Visit  Medication Sig Dispense Refill   blood glucose meter kit and supplies KIT Dispense based on patient and insurance preference. Use up to four times daily as directed. (FOR ICD-9 250.00, 250.01). 1 each 0   celecoxib (CELEBREX) 100 MG capsule Take 1 capsule (100 mg total) by mouth daily. 30 capsule 2   Cholecalciferol (VITAMIN D3) 2000 units TABS Take 2,000 Units by mouth daily.      citalopram (CELEXA) 20 MG tablet Take 1 tablet by mouth once daily 90 tablet 0   cyanocobalamin 500 MCG tablet Take 1,000 mcg by mouth daily.      cyclobenzaprine (FLEXERIL) 10 MG tablet Take 1 tablet (10 mg total) by mouth 3 (three) times daily as needed for muscle spasms. 30 tablet 0   Flaxseed, Linseed, (FLAXSEED OIL PO) Take 1,300 mg by mouth daily.     fluticasone (FLONASE) 50 MCG/ACT nasal spray Place 2 sprays into both nostrils daily. 16 g 6   fluticasone (FLONASE) 50 MCG/ACT nasal spray Place 2 sprays into both nostrils daily. 16 g 6   glimepiride (AMARYL) 2 MG tablet TAKE 1 TABLET BY MOUTH ONCE DAILY BEFORE BREAKFAST 90 tablet 0   glucose blood (ONETOUCH VERIO) test strip Onetouch Verio Check blood sugar twice daily 100 each 12   indapamide (LOZOL) 1.25 MG tablet Take 1 tablet (1.25 mg total) by mouth daily. 90 tablet 3   levothyroxine (SYNTHROID) 88 MCG tablet TAKE 1 TABLET BY MOUTH ONCE DAILY BEFORE BREAKFAST 90 tablet 0   losartan (COZAAR) 50 MG tablet Take 1  tablet (50 mg total) by mouth daily. 90 tablet 1   meclizine (ANTIVERT) 25 MG tablet Take 1 tablet (25 mg total) by mouth 3 (three) times daily as needed for dizziness. 30 tablet 5   ofloxacin (FLOXIN OTIC) 0.3 % OTIC solution Place 10 drops into the right ear daily. 5 mL 0   Omega-3 Fatty Acids (FISH OIL) 1200 MG CAPS Take 1,200 mg by mouth daily.     ondansetron (ZOFRAN) 4 MG tablet Take 1 tablet (4 mg total) by mouth every 8 (eight) hours as needed for nausea or vomiting. 20 tablet 5   OneTouch Delica Lancets 73U MISC 1 Device by Does not apply route 2 (two) times daily as needed. 60 each 5   pantoprazole (PROTONIX) 40 MG tablet Take 1 tablet by mouth once daily 30 tablet 0   propranolol (INDERAL) 20 MG tablet Take 1 tablet (20 mg total)  by mouth 2 (two) times daily. 120 tablet 3   rosuvastatin (CRESTOR) 20 MG tablet Take 1 tablet by mouth once daily 90 tablet 1   Semaglutide, 1 MG/DOSE, 4 MG/3ML SOPN Inject 1 mg as directed once a week. 3 mL 0   No current facility-administered medications on file prior to visit.    ALLERGIES: Allergies  Allergen Reactions   Lisinopril Cough    FAMILY HISTORY: Family History  Problem Relation Age of Onset   Diabetes Mother    Hypertension Mother    Hyperlipidemia Mother    Gallbladder disease Mother    Heart disease Mother    Diabetes Sister    Hypertension Father    Hyperlipidemia Father    Colon polyps Father    Ovarian cancer Maternal Grandmother    Gallbladder disease Maternal Grandmother    Breast cancer Maternal Grandmother 47   Colon cancer Paternal Grandfather    Breast cancer Maternal Aunt        late 69s   Heart disease Other        early cad   Diabetes Brother    Heart attack Brother    Anesthesia problems Neg Hx    Hypotension Neg Hx    Malignant hyperthermia Neg Hx    Pseudochol deficiency Neg Hx    Esophageal cancer Neg Hx       Objective:  *** General: No acute distress.  Patient appears ***-groomed.   Head:   Normocephalic/atraumatic Eyes:  Fundi examined but not visualized Neck: supple, no paraspinal tenderness, full range of motion Heart:  Regular rate and rhythm Lungs:  Clear to auscultation bilaterally Back: No paraspinal tenderness Neurological Exam: alert and oriented to person, place, and time.  Speech fluent and not dysarthric, language intact.  CN II-XII intact. Bulk and tone normal, muscle strength 5/5 throughout.  Sensation to light touch intact.  Deep tendon reflexes 2+ throughout, toes downgoing.  Finger to nose testing intact.  Gait normal, Romberg negative.   Metta Clines, DO  CC: ***

## 2022-02-27 ENCOUNTER — Ambulatory Visit (INDEPENDENT_AMBULATORY_CARE_PROVIDER_SITE_OTHER): Payer: Self-pay | Admitting: Neurology

## 2022-02-27 ENCOUNTER — Encounter: Payer: Self-pay | Admitting: Neurology

## 2022-02-27 VITALS — BP 112/70 | HR 88 | Ht 66.0 in | Wt 225.4 lb

## 2022-02-27 DIAGNOSIS — G43009 Migraine without aura, not intractable, without status migrainosus: Secondary | ICD-10-CM

## 2022-02-27 DIAGNOSIS — R42 Dizziness and giddiness: Secondary | ICD-10-CM

## 2022-02-27 DIAGNOSIS — M4722 Other spondylosis with radiculopathy, cervical region: Secondary | ICD-10-CM

## 2022-02-27 MED ORDER — NORTRIPTYLINE HCL 10 MG PO CAPS: 10.0000 mg | ORAL_CAPSULE | Freq: Every day | ORAL | 5 refills | Status: DC

## 2022-02-27 NOTE — Patient Instructions (Addendum)
Start nortriptyline '10mg'$  at bedtime. We can increase dose in 4 weeks if needed. Stop propranolol Consider following up with the spine surgeon to ask about epidural injection Use meclizine but sparingly Follow up 4 months.

## 2022-02-28 ENCOUNTER — Other Ambulatory Visit: Payer: Self-pay | Admitting: Family Medicine

## 2022-02-28 DIAGNOSIS — K219 Gastro-esophageal reflux disease without esophagitis: Secondary | ICD-10-CM

## 2022-03-08 ENCOUNTER — Encounter: Payer: Self-pay | Admitting: Family Medicine

## 2022-03-15 ENCOUNTER — Ambulatory Visit
Admission: RE | Admit: 2022-03-15 | Discharge: 2022-03-15 | Disposition: A | Discharge status: Home / Self Care | Payer: No Typology Code available for payment source | Source: Ambulatory Visit | Attending: Family Medicine | Admitting: Family Medicine

## 2022-03-15 DIAGNOSIS — R921 Mammographic calcification found on diagnostic imaging of breast: Secondary | ICD-10-CM

## 2022-03-26 ENCOUNTER — Encounter: Payer: Self-pay | Admitting: Family Medicine

## 2022-03-26 ENCOUNTER — Ambulatory Visit (INDEPENDENT_AMBULATORY_CARE_PROVIDER_SITE_OTHER): Payer: Self-pay | Admitting: Family Medicine

## 2022-03-26 VITALS — BP 108/80 | HR 74 | Temp 97.8°F | Resp 18 | Ht 66.0 in | Wt 225.8 lb

## 2022-03-26 DIAGNOSIS — H6123 Impacted cerumen, bilateral: Secondary | ICD-10-CM

## 2022-03-26 DIAGNOSIS — Z0000 Encounter for general adult medical examination without abnormal findings: Secondary | ICD-10-CM

## 2022-03-26 DIAGNOSIS — J0140 Acute pansinusitis, unspecified: Secondary | ICD-10-CM

## 2022-03-26 DIAGNOSIS — F418 Other specified anxiety disorders: Secondary | ICD-10-CM

## 2022-03-26 DIAGNOSIS — F4000 Agoraphobia, unspecified: Secondary | ICD-10-CM

## 2022-03-26 DIAGNOSIS — E1169 Type 2 diabetes mellitus with other specified complication: Secondary | ICD-10-CM

## 2022-03-26 DIAGNOSIS — E1165 Type 2 diabetes mellitus with hyperglycemia: Secondary | ICD-10-CM

## 2022-03-26 DIAGNOSIS — E559 Vitamin D deficiency, unspecified: Secondary | ICD-10-CM

## 2022-03-26 DIAGNOSIS — E785 Hyperlipidemia, unspecified: Secondary | ICD-10-CM

## 2022-03-26 DIAGNOSIS — I10 Essential (primary) hypertension: Secondary | ICD-10-CM

## 2022-03-26 LAB — HEMOGLOBIN A1C: Hgb A1c MFr Bld: 8.8 % — ABNORMAL HIGH (ref 4.6–6.5)

## 2022-03-26 LAB — CBC WITH DIFFERENTIAL/PLATELET
Basophils Absolute: 0.1 10*3/uL (ref 0.0–0.1)
Basophils Relative: 0.7 % (ref 0.0–3.0)
Eosinophils Absolute: 0.2 10*3/uL (ref 0.0–0.7)
Eosinophils Relative: 2 % (ref 0.0–5.0)
HCT: 38.2 % (ref 36.0–46.0)
Hemoglobin: 12.7 g/dL (ref 12.0–15.0)
Lymphocytes Relative: 41.3 % (ref 12.0–46.0)
Lymphs Abs: 3.5 10*3/uL (ref 0.7–4.0)
MCHC: 33.3 g/dL (ref 30.0–36.0)
MCV: 87.1 fl (ref 78.0–100.0)
Monocytes Absolute: 0.6 10*3/uL (ref 0.1–1.0)
Monocytes Relative: 7.1 % (ref 3.0–12.0)
Neutro Abs: 4.1 10*3/uL (ref 1.4–7.7)
Neutrophils Relative %: 48.9 % (ref 43.0–77.0)
Platelets: 246 10*3/uL (ref 150.0–400.0)
RBC: 4.38 Mil/uL (ref 3.87–5.11)
RDW: 13.9 % (ref 11.5–15.5)
WBC: 8.4 10*3/uL (ref 4.0–10.5)

## 2022-03-26 LAB — COMPREHENSIVE METABOLIC PANEL
ALT: 21 U/L (ref 0–35)
AST: 19 U/L (ref 0–37)
Albumin: 4.5 g/dL (ref 3.5–5.2)
Alkaline Phosphatase: 43 U/L (ref 39–117)
BUN: 16 mg/dL (ref 6–23)
CO2: 31 mEq/L (ref 19–32)
Calcium: 9.1 mg/dL (ref 8.4–10.5)
Chloride: 102 mEq/L (ref 96–112)
Creatinine, Ser: 0.58 mg/dL (ref 0.40–1.20)
GFR: 100.43 mL/min (ref >60.00)
Glucose, Bld: 201 mg/dL — ABNORMAL HIGH (ref 70–99)
Potassium: 3.6 mEq/L (ref 3.5–5.1)
Sodium: 141 mEq/L (ref 135–145)
Total Bilirubin: 0.5 mg/dL (ref 0.2–1.2)
Total Protein: 7 g/dL (ref 6.0–8.3)

## 2022-03-26 LAB — MICROALBUMIN / CREATININE URINE RATIO
Creatinine,U: 159.4 mg/dL
Microalb Creat Ratio: 2.9 mg/g (ref 0.0–30.0)
Microalb, Ur: 4.6 mg/dL — ABNORMAL HIGH (ref 0.0–1.9)

## 2022-03-26 LAB — LIPID PANEL
Cholesterol: 122 mg/dL (ref 0–200)
HDL: 38 mg/dL — ABNORMAL LOW (ref >39.00)
NonHDL: 83.67
Total CHOL/HDL Ratio: 3
Triglycerides: 201 mg/dL — ABNORMAL HIGH (ref 0.0–149.0)
VLDL: 40.2 mg/dL — ABNORMAL HIGH (ref 0.0–40.0)

## 2022-03-26 LAB — TSH: TSH: 2.78 u[IU]/mL (ref 0.35–5.50)

## 2022-03-26 LAB — VITAMIN D 25 HYDROXY (VIT D DEFICIENCY, FRACTURES): VITD: 42.39 ng/mL (ref 30.00–100.00)

## 2022-03-26 LAB — LDL CHOLESTEROL, DIRECT: Direct LDL: 66 mg/dL

## 2022-03-26 MED ORDER — FLUTICASONE PROPIONATE 50 MCG/ACT NA SUSP: 2.0000 | Freq: Every day | NASAL | 6 refills | Status: AC

## 2022-03-26 MED ORDER — CITALOPRAM HYDROBROMIDE 40 MG PO TABS: 40.0000 mg | ORAL_TABLET | Freq: Every day | ORAL | 3 refills | Status: DC

## 2022-03-26 NOTE — Assessment & Plan Note (Signed)
Well controlled, no changes to meds. Encouraged heart healthy diet such as the DASH diet and exercise as tolerated.  °

## 2022-03-26 NOTE — Assessment & Plan Note (Signed)
Use debrox and rto prn

## 2022-03-26 NOTE — Assessment & Plan Note (Signed)
Check labs 

## 2022-03-26 NOTE — Assessment & Plan Note (Signed)
hgba1c to be checked, minimize simple carbs. Increase exercise as tolerated. Continue current meds  

## 2022-03-26 NOTE — Patient Instructions (Addendum)
Debrox for ear wax--- otc drops   Carbohydrate Counting for Diabetes Mellitus, Adult Carbohydrate counting is a method of keeping track of how many carbohydrates you eat. Eating carbohydrates increases the amount of sugar (glucose) in the blood. Counting how many carbohydrates you eat improves how well you manage your blood glucose. This, in turn, helps you manage your diabetes. Carbohydrates are measured in grams (g) per serving. It is important to know how many carbohydrates (in grams or by serving size) you can have in each meal. This is different for every person. A dietitian can help you make a meal plan and calculate how many carbohydrates you should have at each meal and snack. What foods contain carbohydrates? Carbohydrates are found in the following foods: Grains, such as breads and cereals. Dried beans and soy products. Starchy vegetables, such as potatoes, peas, and corn. Fruit and fruit juices. Milk and yogurt. Sweets and snack foods, such as cake, cookies, candy, chips, and soft drinks. How do I count carbohydrates in foods? There are two ways to count carbohydrates in food. You can read food labels or learn standard serving sizes of foods. You can use either of these methods or a combination of both. Using the Nutrition Facts label The Nutrition Facts list is included on the labels of almost all packaged foods and beverages in the [place]. It includes: The serving size. Information about nutrients in each serving, including the grams of carbohydrate per serving. To use the Nutrition Facts, decide how many servings you will have. Then, multiply the number of servings by the number of carbohydrates per serving. The resulting number is the total grams of carbohydrates that you will be having. Learning the standard serving sizes of foods When you eat carbohydrate foods that are not packaged or do not include Nutrition Facts on the label, you need to measure the servings in order  to count the grams of carbohydrates. Measure the foods that you will eat with a food scale or measuring cup, if needed. Decide how many standard-size servings you will eat. Multiply the number of servings by 15. For foods that contain carbohydrates, one serving equals 15 g of carbohydrates. For example, if you eat 2 cups or 10 oz (300 g) of strawberries, you will have eaten 2 servings and 30 g of carbohydrates (2 servings x 15 g = 30 g). For foods that have more than one food mixed, such as soups and casseroles, you must count the carbohydrates in each food that is included. The following list contains standard serving sizes of common carbohydrate-rich foods. Each of these servings has about 15 g of carbohydrates: 1 slice of bread. 1 six-inch (15 cm) tortilla. ? cup or 2 oz (53 g) cooked rice or pasta.  cup or 3 oz (85 g) cooked or canned, drained and rinsed beans or lentils.  cup or 3 oz (85 g) starchy vegetable, such as peas, corn, or squash.  cup or 4 oz (120 g) hot cereal.  cup or 3 oz (85 g) boiled or mashed potatoes, or  or 3 oz (85 g) of a large baked potato.  cup or 4 fl oz (118 mL) fruit juice. 1 cup or 8 fl oz (237 mL) milk. 1 small or 4 oz (106 g) apple.  or 2 oz (63 g) of a medium banana. 1 cup or 5 oz (150 g) strawberries. 3 cups or 1 oz (28.3 g) popped popcorn. What is an example of carbohydrate counting? To calculate the grams of  carbohydrates in this sample meal, follow the steps shown below. Sample meal 3 oz (85 g) chicken breast. ? cup or 4 oz (106 g) brown rice.  cup or 3 oz (85 g) corn. 1 cup or 8 fl oz (237 mL) milk. 1 cup or 5 oz (150 g) strawberries with sugar-free whipped topping. Carbohydrate calculation Identify the foods that contain carbohydrates: Rice. Corn. Milk. Strawberries. Calculate how many servings you have of each food: 2 servings rice. 1 serving corn. 1 serving milk. 1 serving strawberries. Multiply each number of servings by 15  g: 2 servings rice x 15 g = 30 g. 1 serving corn x 15 g = 15 g. 1 serving milk x 15 g = 15 g. 1 serving strawberries x 15 g = 15 g. Add together all of the amounts to find the total grams of carbohydrates eaten: 30 g + 15 g + 15 g + 15 g = 75 g of carbohydrates total. What are tips for following this plan? Shopping Develop a meal plan and then make a shopping list. Buy fresh and frozen vegetables, fresh and frozen fruit, dairy, eggs, beans, lentils, and whole grains. Look at food labels. Choose foods that have more fiber and less sugar. Avoid processed foods and foods with added sugars. Meal planning Aim to have the same number of grams of carbohydrates at each meal and for each snack time. Plan to have regular, balanced meals and snacks. Where to find more information American Diabetes Association: diabetes.org Centers for Disease Control and Prevention: [employer].[place] Academy of Nutrition and Dietetics: eatright.org Association of Diabetes Care & Education Specialists: diabeteseducator.org Summary Carbohydrate counting is a method of keeping track of how many carbohydrates you eat. Eating carbohydrates increases the amount of sugar (glucose) in your blood. Counting how many carbohydrates you eat improves how well you manage your blood glucose. This helps you manage your diabetes. A dietitian can help you make a meal plan and calculate how many carbohydrates you should have at each meal and snack. This information is not intended to replace advice given to you by your health care provider. Make sure you discuss any questions you have with your health care provider. Document Revised: 01/13/2020 Document Reviewed: 01/13/2020 Elsevier Patient Education  2023 [employer]. Preventive Care 37-35 Years Old, Female Preventive care refers to lifestyle choices and visits with your health care provider that can promote health and wellness. Preventive care visits are also called wellness exams. What  can I expect for my preventive care visit? Counseling Your health care provider may ask you questions about your: Medical history, including: Past medical problems. Family medical history. Pregnancy history. Current health, including: Menstrual cycle. Method of birth control. Emotional well-being. Home life and relationship well-being. Sexual activity and sexual health. Lifestyle, including: Alcohol, nicotine or tobacco, and drug use. Access to firearms. Diet, exercise, and sleep habits. Work and work Statistician. Sunscreen use. Safety issues such as seatbelt and bike helmet use. Physical exam Your health care provider will check your: Height and weight. These may be used to calculate your BMI (body mass index). BMI is a measurement that tells if you are at a healthy weight. Waist circumference. This measures the distance around your waistline. This measurement also tells if you are at a healthy weight and may help predict your risk of certain diseases, such as type 2 diabetes and high blood pressure. Heart rate and blood pressure. Body temperature. Skin for abnormal spots. What immunizations do I need?  Vaccines are usually  given at various ages, according to a schedule. Your health care provider will recommend vaccines for you based on your age, medical history, and lifestyle or other factors, such as travel or where you work. What tests do I need? Screening Your health care provider may recommend screening tests for certain conditions. This may include: Lipid and cholesterol levels. Diabetes screening. This is done by checking your blood sugar (glucose) after you have not eaten for a while (fasting). Pelvic exam and Pap test. Hepatitis B test. Hepatitis C test. HIV (human immunodeficiency virus) test. STI (sexually transmitted infection) testing, if you are at risk. Lung cancer screening. Colorectal cancer screening. Mammogram. Talk with your health care provider about  when you should start having regular mammograms. This may depend on whether you have a family history of breast cancer. BRCA-related cancer screening. This may be done if you have a family history of breast, ovarian, tubal, or peritoneal cancers. Bone density scan. This is done to screen for osteoporosis. Talk with your health care provider about your test results, treatment options, and if necessary, the need for more tests. Follow these instructions at home: Eating and drinking  Eat a diet that includes fresh fruits and vegetables, whole grains, lean protein, and low-fat dairy products. Take vitamin and mineral supplements as recommended by your health care provider. Do not drink alcohol if: Your health care provider tells you not to drink. You are pregnant, may be pregnant, or are planning to become pregnant. If you drink alcohol: Limit how much you have to 0-1 drink a day. Know how much alcohol is in your drink. In the [place] one drink equals one 12 oz bottle of beer (355 mL), one 5 oz glass of wine (148 mL), or one 1 oz glass of hard liquor (44 mL). Lifestyle Brush your teeth every morning and night with fluoride toothpaste. Floss one time each day. Exercise for at least 30 minutes 5 or more days each week. Do not use any products that contain nicotine or tobacco. These products include cigarettes, chewing tobacco, and vaping devices, such as e-cigarettes. If you need help quitting, ask your health care provider. Do not use drugs. If you are sexually active, practice safe sex. Use a condom or other form of protection to prevent STIs. If you do not wish to become pregnant, use a form of birth control. If you plan to become pregnant, see your health care provider for a prepregnancy visit. Take aspirin only as told by your health care provider. Make sure that you understand how much to take and what form to take. Work with your health care provider to find out whether it is safe and  beneficial for you to take aspirin daily. Find healthy ways to manage stress, such as: Meditation, yoga, or listening to music. Journaling. Talking to a trusted person. Spending time with friends and family. Minimize exposure to UV radiation to reduce your risk of skin cancer. Safety Always wear your seat belt while driving or riding in a vehicle. Do not drive: If you have been drinking alcohol. Do not ride with someone who has been drinking. When you are tired or distracted. While texting. If you have been using any mind-altering substances or drugs. Wear a helmet and other protective equipment during sports activities. If you have firearms in your house, make sure you follow all gun safety procedures. Seek help if you have been physically or sexually abused. What's next? Visit your health care provider once a year for an  annual wellness visit. Ask your health care provider how often you should have your eyes and teeth checked. Stay up to date on all vaccines. This information is not intended to replace advice given to you by your health care provider. Make sure you discuss any questions you have with your health care provider. Document Revised: 12/07/2020 Document Reviewed: 12/07/2020 Elsevier Patient Education  [place].

## 2022-03-26 NOTE — Assessment & Plan Note (Signed)
Check labs con't synthroid 

## 2022-03-26 NOTE — Assessment & Plan Note (Signed)
ghm utd Check labs  See avs  

## 2022-03-26 NOTE — Progress Notes (Signed)
Subjective:   By signing my name below, I, Carylon Perches, attest that this documentation has been prepared under the direction and in the presence of [name] DO 03/26/2022   Patient ID: Felicia Solis, female    DOB: September 25, 1965, 56 y.o.   MRN: [id number]  Chief Complaint  Patient presents with   Annual Exam    Pt states fasting     HPI Patient is in today for a comprehensive physical exam. She is requesting to have her Flonase 50 mcg/act refilled during today's visit.  She reports that she does not get her blood sugars checked.   She is currently taking 20 Mg of Celexa but is requesting an increase in dosage as she believes the current dosage is not effective.   She is interested in weight loss injections. She is currently on ozempic-- we may be able to increase the dosage to help her.  She is regularly following up with her gynecologist for pap smears and mammograms.  ----- She reports having dizzy spells which worsen when interacting with her right ear ---- cerumen impaction seems to bring on the vertigo   She denies having any fever, new muscle pain, joint pain , new moles, congestion, sinus pain, sore throat, chest pain, palpations, cough, SOB ,wheezing,n/v/d constipation, blood in stool, dysuria, frequency, hematuria, at this time  Colonoscopy last completed on 12/03/2014 Pap Smear last completed on 09/11/2017 Mammogram last completed on 03/15/2022 Immunizations: Patient is not interested in receiving the influenza vaccine. She has not received the Covid vaccine. She is scheduled for a dental exam on 04/12/2022 She is UTD on vision exams   Past Medical History:  Diagnosis Date   Anemia    anemia  when pregnant in 2002   Anxiety    Arthritis    "knees and back" (09/28/2016)   Asthma 5 yrs ago    told she had asthma.. does not remember doctors name.    Chronic lower back pain    Depression    takes citalopram   History of hiatal hernia    History of  kidney stones    Hyperlipidemia    Hypothyroidism    LBBB (left bundle branch block)    rate-related; identified on stress test 03/21/16   Migraine    "weekly" (09/28/2016)   Positive TB test 2008   pt states she took 9 months of medicine for positive tb skin test   Shortness of breath    with exertion   Thyroid disease    Type II diabetes mellitus ([place]) dx'd 2016    Past Surgical History:  Procedure Laterality Date   DILATION AND CURETTAGE OF UTERUS  1999   ENDOMETRIAL ABLATION     EXTRACORPOREAL SHOCK WAVE LITHOTRIPSY Right 04/02/2019   Procedure: EXTRACORPOREAL SHOCK WAVE LITHOTRIPSY (ESWL);  Surgeon: Ceasar Mons, MD;  Location: [facility];  Service: Urology;  Laterality: Right;   LAPAROSCOPY     years ago ..pt does not know where surgery was done ... states" my stomach would blow up then go down"   THYROIDECTOMY  09/28/2011   Procedure: THYROIDECTOMY;  Surgeon: Izora Gala, MD;  Location: [facility];  Service: ENT;  Laterality: Right;  RIGHT THYROIDECTOMY WITH FROZEN SECTION   THYROIDECTOMY  09/28/2016   completion of thyroidectomy/notes 09/28/2016   THYROIDECTOMY Left 09/28/2016   Procedure: COMPLETION OF THYROIDECTOMY;  Surgeon: Izora Gala, MD;  Location: [facility];  Service: ENT;  Laterality: Left;   TUBAL LIGATION  Family History  Problem Relation Age of Onset   Diabetes Mother    Hypertension Mother    Hyperlipidemia Mother    Gallbladder disease Mother    Heart disease Mother    Diabetes Sister    Hypertension Father    Hyperlipidemia Father    Colon polyps Father    Ovarian cancer Maternal Grandmother    Gallbladder disease Maternal Grandmother    Breast cancer Maternal Grandmother 35   Colon cancer Paternal Grandfather    Breast cancer Maternal Aunt        late 104s   Heart disease Other        early cad   Diabetes Brother    Heart attack Brother    Anesthesia problems Neg Hx    Hypotension Neg Hx    Malignant hyperthermia Neg Hx    Pseudochol deficiency  Neg Hx    Esophageal cancer Neg Hx     Social History   Socioeconomic History   Marital status: Married    Spouse name: Not on file   Number of children: 3   Years of education: Not on file   Highest education level: Not on file  Occupational History   Occupation: CNA  Tobacco Use   Smoking status: Never   Smokeless tobacco: Never  Vaping Use   Vaping Use: Never used  Substance and Sexual Activity   Alcohol use: No    Alcohol/week: 0.0 standard drinks of alcohol   Drug use: No   Sexual activity: Not on file  Other Topics Concern   Not on file  Social History Narrative   No exercise--  Walking and lifting pts at work   Right handed   Lives in single story home   Social Determinants of Health   Financial Resource Strain: Not on file  Food Insecurity: Not on file  Transportation Needs: Not on file  Physical Activity: Not on file  Stress: Not on file  Social Connections: Not on file  Intimate Partner Violence: Not on file    Outpatient Medications Prior to Visit  Medication Sig Dispense Refill   Ascorbic Acid (VITAMIN C) 100 MG tablet Take 100 mg by mouth daily.     blood glucose meter kit and supplies KIT Dispense based on patient and insurance preference. Use up to four times daily as directed. (FOR ICD-9 250.00, 250.01). 1 each 0   celecoxib (CELEBREX) 100 MG capsule Take 1 capsule (100 mg total) by mouth daily. 30 capsule 2   Cholecalciferol (VITAMIN D3) 2000 units TABS Take 2,000 Units by mouth daily.      cyanocobalamin 500 MCG tablet Take 1,000 mcg by mouth daily.      cyclobenzaprine (FLEXERIL) 10 MG tablet Take 1 tablet (10 mg total) by mouth 3 (three) times daily as needed for muscle spasms. 30 tablet 0   dorzolamide-timolol (COSOPT) 22.3-6.8 MG/ML ophthalmic solution 1 drop 2 (two) times daily.     Flaxseed, Linseed, (FLAXSEED OIL PO) Take 1,300 mg by mouth daily.     fluticasone (FLONASE) 50 MCG/ACT nasal spray Place 2 sprays into both nostrils daily. 16 g  6   glimepiride (AMARYL) 2 MG tablet TAKE 1 TABLET BY MOUTH ONCE DAILY BEFORE BREAKFAST 90 tablet 0   glucose blood (ONETOUCH VERIO) test strip Onetouch Verio Check blood sugar twice daily 100 each 12   indapamide (LOZOL) 1.25 MG tablet Take 1 tablet (1.25 mg total) by mouth daily. 90 tablet 3   levothyroxine (SYNTHROID) 88 MCG tablet  TAKE 1 TABLET BY MOUTH ONCE DAILY BEFORE BREAKFAST 90 tablet 0   losartan (COZAAR) 50 MG tablet Take 1 tablet (50 mg total) by mouth daily. 90 tablet 1   meclizine (ANTIVERT) 25 MG tablet Take 1 tablet (25 mg total) by mouth 3 (three) times daily as needed for dizziness. 30 tablet 5   nortriptyline (PAMELOR) 10 MG capsule Take 1 capsule (10 mg total) by mouth at bedtime. 30 capsule 5   ofloxacin (FLOXIN OTIC) 0.3 % OTIC solution Place 10 drops into the right ear daily. 5 mL 0   Omega-3 Fatty Acids (FISH OIL) 1200 MG CAPS Take 1,200 mg by mouth daily.     ondansetron (ZOFRAN) 4 MG tablet Take 1 tablet (4 mg total) by mouth every 8 (eight) hours as needed for nausea or vomiting. 20 tablet 5   OneTouch Delica Lancets 11A MISC 1 Device by Does not apply route 2 (two) times daily as needed. 60 each 5   pantoprazole (PROTONIX) 40 MG tablet Take 1 tablet (40 mg total) by mouth daily. 90 tablet 1   rosuvastatin (CRESTOR) 20 MG tablet Take 1 tablet by mouth once daily 90 tablet 1   Semaglutide, 1 MG/DOSE, 4 MG/3ML SOPN Inject 1 mg as directed once a week. 3 mL 0   timolol (TIMOPTIC) 0.5 % ophthalmic solution 1 drop 2 (two) times daily.     citalopram (CELEXA) 20 MG tablet Take 1 tablet by mouth once daily 90 tablet 0   fluticasone (FLONASE) 50 MCG/ACT nasal spray Place 2 sprays into both nostrils daily. 16 g 6   No facility-administered medications prior to visit.    Allergies  Allergen Reactions   Lisinopril Cough    Review of Systems  Constitutional:  Negative for fever and malaise/fatigue.  HENT:  Negative for congestion, sinus pain and sore throat.   Eyes:   Negative for blurred vision.  Respiratory:  Negative for cough, shortness of breath and wheezing.   Cardiovascular:  Negative for chest pain, palpitations and leg swelling.  Gastrointestinal:  Negative for abdominal pain, blood in stool, constipation, diarrhea, nausea and vomiting.  Genitourinary:  Negative for dysuria, frequency and hematuria.  Musculoskeletal:  Negative for falls, joint pain and myalgias.  Skin:  Negative for rash.       (-) [place]  Neurological:  Negative for dizziness, loss of consciousness and headaches.  Endo/Heme/Allergies:  Negative for environmental allergies.  Psychiatric/Behavioral:  Negative for depression. The patient is not nervous/anxious.        Objective:    Physical Exam Vitals and nursing note reviewed.  Constitutional:      General: She is not in acute distress.    Appearance: Normal appearance. She is not ill-appearing.  HENT:     Head: Normocephalic and atraumatic.     Right Ear: Tympanic membrane, ear canal and external ear normal. There is impacted cerumen.     Left Ear: Tympanic membrane, ear canal and external ear normal. There is impacted cerumen.     Nose: No congestion or rhinorrhea.     Mouth/Throat:     Pharynx: Oropharynx is clear. No oropharyngeal exudate or posterior oropharyngeal erythema.  Eyes:     Extraocular Movements: Extraocular movements intact.     Pupils: Pupils are equal, round, and reactive to light.  Cardiovascular:     Rate and Rhythm: Normal rate and regular rhythm.     Heart sounds: Normal heart sounds. No murmur heard.    No gallop.  Pulmonary:  Effort: Pulmonary effort is normal. No respiratory distress.     Breath sounds: Normal breath sounds. No wheezing or rales.  Abdominal:     General: Abdomen is flat. Bowel sounds are normal. There is no distension.     Palpations: There is no mass.     Tenderness: There is no abdominal tenderness. There is no right CVA tenderness, left CVA tenderness, guarding  or rebound.     Hernia: No hernia is present.  Musculoskeletal:        General: Normal range of motion.  Skin:    General: Skin is warm and dry.  Neurological:     General: No focal deficit present.     Mental Status: She is alert and oriented to person, place, and time.     Gait: Gait normal.  Psychiatric:        Mood and Affect: Mood normal.        Behavior: Behavior normal.        Thought Content: Thought content normal.        Judgment: Judgment normal.     BP 108/80 (BP Location: Left Arm, Patient Position: Sitting, Cuff Size: Large)   Pulse 74   Temp 97.8 F (36.6 C) (Oral)   Resp 18   Ht 5\' 6"  (1.676 m)   Wt 225 lb 12.8 oz (102.4 kg)   SpO2 97%   BMI 36.45 kg/m  Wt Readings from Last 3 Encounters:  03/26/22 225 lb 12.8 oz (102.4 kg)  02/27/22 225 lb 6.4 oz (102.2 kg)  02/15/22 224 lb (101.6 kg)    Diabetic Foot Exam - Simple   No data filed    Lab Results  Component Value Date   WBC 8.0 08/02/2021   HGB 13.1 08/02/2021   HCT 40.0 08/02/2021   PLT 257 08/02/2021   GLUCOSE 106 (H) 08/02/2021   CHOL 112 08/19/2020   TRIG 123 08/19/2020   HDL 38 (L) 08/19/2020   LDLCALC 54 08/19/2020   ALT 22 07/11/2021   AST 22 07/11/2021   NA 138 08/02/2021   K 3.8 08/02/2021   CL 101 08/02/2021   CREATININE 0.64 08/02/2021   BUN 11 08/02/2021   CO2 27 08/02/2021   TSH 2.39 07/11/2021   HGBA1C 9.8 (H) 08/19/2020   MICROALBUR 13.7 08/19/2020    Lab Results  Component Value Date   TSH 2.39 07/11/2021   Lab Results  Component Value Date   WBC 8.0 08/02/2021   HGB 13.1 08/02/2021   HCT 40.0 08/02/2021   MCV 86.0 08/02/2021   PLT 257 08/02/2021   Lab Results  Component Value Date   NA 138 08/02/2021   K 3.8 08/02/2021   CO2 27 08/02/2021   GLUCOSE 106 (H) 08/02/2021   BUN 11 08/02/2021   CREATININE 0.64 08/02/2021   BILITOT 0.5 07/11/2021   ALKPHOS 36 (L) 07/11/2021   AST 22 07/11/2021   ALT 22 07/11/2021   PROT 7.6 07/11/2021   ALBUMIN 4.6  07/11/2021   CALCIUM 9.2 08/02/2021   ANIONGAP 10 08/02/2021   GFR 101.79 07/11/2021   Lab Results  Component Value Date   CHOL 112 08/19/2020   Lab Results  Component Value Date   HDL 38 (L) 08/19/2020   Lab Results  Component Value Date   LDLCALC 54 08/19/2020   Lab Results  Component Value Date   TRIG 123 08/19/2020   Lab Results  Component Value Date   CHOLHDL 2.9 08/19/2020   Lab Results  Component Value Date   HGBA1C 9.8 (H) 08/19/2020       Assessment & Plan:   Problem List Items Addressed This Visit       Unprioritized   Pansinusitis   Relevant Medications   fluticasone (FLONASE) 50 MCG/ACT nasal spray   Vitamin D deficiency    Check labs      Relevant Orders   VITAMIN D 25 Hydroxy (Vit-D Deficiency, Fractures)   Uncontrolled type 2 diabetes mellitus with hyperglycemia ([place])    hgba1c to be checked, minimize simple carbs. Increase exercise as tolerated. Continue current meds       Relevant Orders   CBC with Differential/Platelet   Comprehensive metabolic panel   Hemoglobin A1c   Lipid panel   Microalbumin / creatinine urine ratio   TSH   Preventative health care - Primary    ghm utd Check labs See avs      Relevant Orders   CBC with Differential/Platelet   Comprehensive metabolic panel   Hemoglobin A1c   Lipid panel   Microalbumin / creatinine urine ratio   TSH   Hyperlipidemia associated with type 2 diabetes mellitus ([facility])    Tolerating statin, encouraged heart healthy diet, avoid trans fats, minimize simple carbs and saturated fats. Increase exercise as tolerated      Relevant Orders   CBC with Differential/Platelet   Comprehensive metabolic panel   Hemoglobin A1c   Lipid panel   Microalbumin / creatinine urine ratio   TSH   Essential hypertension    Well controlled, no changes to meds. Encouraged heart healthy diet such as the DASH diet and exercise as tolerated.       Depression with anxiety    Inc celexa to 40 mg  daily       Relevant Medications   citalopram (CELEXA) 40 MG tablet   Bilateral impacted cerumen    Use debrox and rto prn       Other Visit Diagnoses     Primary hypertension       Relevant Orders   CBC with Differential/Platelet   Comprehensive metabolic panel   Hemoglobin A1c   Lipid panel   Microalbumin / creatinine urine ratio   TSH   Agoraphobia       Relevant Medications   citalopram (CELEXA) 40 MG tablet      Meds ordered this encounter  Medications   fluticasone (FLONASE) 50 MCG/ACT nasal spray    Sig: Place 2 sprays into both nostrils daily.    Dispense:  16 g    Refill:  6   citalopram (CELEXA) 40 MG tablet    Sig: Take 1 tablet (40 mg total) by mouth daily.    Dispense:  90 tablet    Refill:  3    I, [name], DO, personally preformed the services described in this documentation.  All medical record entries made by the scribe were at my direction and in my presence.  I have reviewed the chart and discharge instructions (if applicable) and agree that the record reflects my personal performance and is accurate and complete. 03/26/2022   I,Amber Collins,acting as a scribe for [name], DO.,have documented all relevant documentation on the behalf of [name], DO,as directed by  [name], DO while in the presence of [name], DO.    [name], DO

## 2022-03-26 NOTE — Assessment & Plan Note (Signed)
Inc celexa to 40 mg daily

## 2022-03-26 NOTE — Assessment & Plan Note (Signed)
Tolerating statin, encouraged heart healthy diet, avoid trans fats, minimize simple carbs and saturated fats. Increase exercise as tolerated 

## 2022-04-03 LAB — HM DIABETES EYE EXAM

## 2022-04-17 ENCOUNTER — Other Ambulatory Visit: Payer: Self-pay | Admitting: Family Medicine

## 2022-04-18 LAB — HM PAP SMEAR
HM Pap smear: ABNORMAL
HPV 16/18/45 genotyping: NEGATIVE
HPV, high-risk: NEGATIVE

## 2022-04-20 ENCOUNTER — Telehealth: Payer: Self-pay | Admitting: *Deleted

## 2022-04-20 ENCOUNTER — Other Ambulatory Visit: Payer: Self-pay | Admitting: Family Medicine

## 2022-04-20 MED ORDER — SEMAGLUTIDE (2 MG/DOSE) 8 MG/3ML ~~LOC~~ SOPN: 2.0000 mg | PEN_INJECTOR | SUBCUTANEOUS | 3 refills | Status: DC

## 2022-04-20 NOTE — Telephone Encounter (Signed)
Spoke with patient and she asked about her lab work and provider put that she needs to try semaglutide, but she is already on the ozempic '1mg'$ .  She stated that you talked about raising it to '2mg'$ .  Can you please review labs again and send back to me if you are going to change dose?

## 2022-04-23 NOTE — Telephone Encounter (Signed)
New dose sent into [employer] and patient notified.

## 2022-04-24 ENCOUNTER — Telehealth: Payer: Self-pay

## 2022-04-24 NOTE — Telephone Encounter (Signed)
Pt's Ozempic 1 MG has arrived. Pt made aware. Med placed in the frig by [place].

## 2022-04-30 ENCOUNTER — Other Ambulatory Visit: Payer: Self-pay | Admitting: Family Medicine

## 2022-04-30 DIAGNOSIS — E785 Hyperlipidemia, unspecified: Secondary | ICD-10-CM

## 2022-05-01 ENCOUNTER — Other Ambulatory Visit: Payer: Self-pay | Admitting: Family Medicine

## 2022-05-22 ENCOUNTER — Telehealth: Payer: Self-pay | Admitting: Family Medicine

## 2022-05-22 NOTE — Telephone Encounter (Signed)
Pt asked to speak with [facility]. Stated she thought she was supposed to get '2mg'$  of ozempic but only received '1mg'$ . Please advise.

## 2022-05-23 NOTE — Telephone Encounter (Signed)
Spoke with patient yesterday and advised that I tried calling in between pts but no answer.  She will keep trying to call as well.

## 2022-06-05 ENCOUNTER — Telehealth: Payer: Self-pay

## 2022-06-05 NOTE — Telephone Encounter (Signed)
Pt made aware that Ozempic '2mg'$  has arrived and is ready for pick up.

## 2022-07-06 ENCOUNTER — Encounter: Payer: Self-pay | Admitting: Family Medicine

## 2022-07-06 ENCOUNTER — Ambulatory Visit (INDEPENDENT_AMBULATORY_CARE_PROVIDER_SITE_OTHER): Payer: Self-pay | Admitting: Family Medicine

## 2022-07-06 VITALS — BP 110/78 | HR 76 | Temp 98.1°F | Resp 18 | Ht 66.0 in | Wt 221.2 lb

## 2022-07-06 DIAGNOSIS — R079 Chest pain, unspecified: Secondary | ICD-10-CM

## 2022-07-06 DIAGNOSIS — R1013 Epigastric pain: Secondary | ICD-10-CM

## 2022-07-06 NOTE — Progress Notes (Signed)
Subjective:   By signing my name below, I, Shehryar Baig, attest that this documentation has been prepared under the direction and in the presence of [name], DO. 07/06/2022   Patient ID: Felicia Solis, female    DOB: 1966/05/16, 57 y.o.   MRN: [id number]  Chief Complaint  Patient presents with  . Chest Pain    HPI Patient is in today for an office visit.  She is experiencing worsening pain in the middle of the chest and back. She had an episode of sharp chest pain on Tuesday night. She managed this with drinking Coca Cola. She believes the chest pain may be associated with reflux and has had some relief with drinking carbonated beverages. She has some relief when bending over and raising back up in a seated position.   Past Medical History:  Diagnosis Date  . Anemia    anemia  when pregnant in 2002  . Anxiety   . Arthritis    "knees and back" (09/28/2016)  . Asthma 5 yrs ago    told she had asthma.. does not remember doctors name.   . Chronic lower back pain   . Depression    takes citalopram  . History of hiatal hernia   . History of kidney stones   . Hyperlipidemia   . Hypothyroidism   . LBBB (left bundle branch block)    rate-related; identified on stress test 03/21/16  . Migraine    "weekly" (09/28/2016)  . 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 Shortness of breath    with exertion  . Thyroid disease   . Type II diabetes mellitus ([place]) dx'd 2016    Past Surgical History:  Procedure Laterality Date  . DILATION AND CURETTAGE OF UTERUS  1999  . ENDOMETRIAL ABLATION    . EXTRACORPOREAL SHOCK WAVE LITHOTRIPSY Right 04/02/2019   Procedure: EXTRACORPOREAL SHOCK WAVE LITHOTRIPSY (ESWL);  Surgeon: Ceasar Mons, MD;  Location: [facility];  Service: Urology;  Laterality: Right;  . LAPAROSCOPY     years ago ..pt does not know where surgery was done ... states" my stomach would blow up then go down"   . THYROIDECTOMY  09/28/2011   Procedure: THYROIDECTOMY;  Surgeon: Izora Gala, MD;  Location: [facility];  Service: ENT;  Laterality: Right;  RIGHT THYROIDECTOMY WITH FROZEN SECTION  . THYROIDECTOMY  09/28/2016   completion of thyroidectomy/notes 09/28/2016  . THYROIDECTOMY Left 09/28/2016   Procedure: COMPLETION OF THYROIDECTOMY;  Surgeon: Izora Gala, MD;  Location: [facility];  Service: ENT;  Laterality: Left;  . TUBAL LIGATION      Family History  Problem Relation Age of Onset  . Diabetes Mother   . Hypertension Mother   . Hyperlipidemia Mother   . Gallbladder disease Mother   . Heart disease Mother   . Diabetes Sister   . Hypertension Father   . Hyperlipidemia Father   . Colon polyps Father   . Ovarian cancer Maternal Grandmother   . Gallbladder disease Maternal Grandmother   . Breast cancer Maternal Grandmother 11  . Colon cancer Paternal Grandfather   . Breast cancer Maternal Aunt        late 77s  . Heart disease Other        early cad  . Diabetes Brother   . Heart attack Brother   . Anesthesia problems Neg Hx   . Hypotension Neg Hx   .  Malignant hyperthermia Neg Hx   . Pseudochol deficiency Neg Hx   . Esophageal cancer Neg Hx     Social History   Socioeconomic History  . Marital status: Married    Spouse name: Not on file  . Number of children: 3  . Years of education: Not on file  . Highest education level: Not on file  Occupational History  . Occupation: CNA  Tobacco Use  . Smoking status: Never  . Smokeless tobacco: Never  Vaping Use  . Vaping Use: Never used  Substance and Sexual Activity  . Alcohol use: No    Alcohol/week: 0.0 standard drinks of alcohol  . Drug use: No  . Sexual activity: Not on file  Other Topics Concern  . Not on file  Social History Narrative   No exercise--  Walking and lifting pts at work   Right handed   Lives in single story home   Social Determinants of Health   Financial Resource Strain: Not on file  Food Insecurity: Not on  file  Transportation Needs: Not on file  Physical Activity: Not on file  Stress: Not on file  Social Connections: Not on file  Intimate Partner Violence: Not on file    Outpatient Medications Prior to Visit  Medication Sig Dispense Refill  . Ascorbic Acid (VITAMIN C) 100 MG tablet Take 100 mg by mouth daily.    . blood glucose meter kit and supplies KIT Dispense based on patient and insurance preference. Use up to four times daily as directed. (FOR ICD-9 250.00, 250.01). 1 each 0  . celecoxib (CELEBREX) 100 MG capsule Take 1 capsule (100 mg total) by mouth daily. 30 capsule 2  . Cholecalciferol (VITAMIN D3) 2000 units TABS Take 2,000 Units by mouth daily.     . citalopram (CELEXA) 40 MG tablet Take 1 tablet (40 mg total) by mouth daily. 90 tablet 3  . cyanocobalamin 500 MCG tablet Take 1,000 mcg by mouth daily.     . cyclobenzaprine (FLEXERIL) 10 MG tablet Take 1 tablet (10 mg total) by mouth 3 (three) times daily as needed for muscle spasms. 30 tablet 0  . dorzolamide-timolol (COSOPT) 22.3-6.8 MG/ML ophthalmic solution 1 drop 2 (two) times daily.    . Flaxseed, Linseed, (FLAXSEED OIL PO) Take 1,300 mg by mouth daily.    . fluticasone (FLONASE) 50 MCG/ACT nasal spray Place 2 sprays into both nostrils daily. 16 g 6  . fluticasone (FLONASE) 50 MCG/ACT nasal spray Place 2 sprays into both nostrils daily. 16 g 6  . glimepiride (AMARYL) 2 MG tablet Take 1 tablet (2 mg total) by mouth daily before breakfast. 90 tablet 1  . glucose blood (ONETOUCH VERIO) test strip Onetouch Verio Check blood sugar twice daily 100 each 12  . indapamide (LOZOL) 1.25 MG tablet Take 1 tablet by mouth once daily 90 tablet 1  . levothyroxine (SYNTHROID) 88 MCG tablet Take 1 tablet (88 mcg total) by mouth daily before breakfast. 90 tablet 1  . losartan (COZAAR) 50 MG tablet Take 1 tablet (50 mg total) by mouth daily. 90 tablet 1  . meclizine (ANTIVERT) 25 MG tablet Take 1 tablet (25 mg total) by mouth 3 (three) times  daily as needed for dizziness. 30 tablet 5  . nortriptyline (PAMELOR) 10 MG capsule Take 1 capsule (10 mg total) by mouth at bedtime. 30 capsule 5  . ofloxacin (FLOXIN OTIC) 0.3 % OTIC solution Place 10 drops into the right ear daily. 5 mL 0  . Omega-3  Fatty Acids (FISH OIL) 1200 MG CAPS Take 1,200 mg by mouth daily.    . ondansetron (ZOFRAN) 4 MG tablet Take 1 tablet (4 mg total) by mouth every 8 (eight) hours as needed for nausea or vomiting. 20 tablet 5  . OneTouch Delica Lancets 86V MISC 1 Device by Does not apply route 2 (two) times daily as needed. 60 each 5  . pantoprazole (PROTONIX) 40 MG tablet Take 1 tablet (40 mg total) by mouth daily. 90 tablet 1  . rosuvastatin (CRESTOR) 20 MG tablet Take 1 tablet (20 mg total) by mouth daily. 90 tablet 1  . Semaglutide, 2 MG/DOSE, 8 MG/3ML SOPN Inject 2 mg as directed once a week. 3 mL 3  . timolol (TIMOPTIC) 0.5 % ophthalmic solution 1 drop 2 (two) times daily.     No facility-administered medications prior to visit.    Allergies  Allergen Reactions  . Lisinopril Cough    Review of Systems  Constitutional:  Negative for fever and malaise/fatigue.  HENT:  Negative for congestion.   Eyes:  Negative for blurred vision.  Respiratory:  Negative for cough and shortness of breath.   Cardiovascular:  Positive for chest pain (Middle). Negative for palpitations and leg swelling.  Gastrointestinal:  Positive for abdominal pain (LUQ). Negative for vomiting.  Musculoskeletal:  Positive for back pain (Upper middle).  Skin:  Negative for rash.  Neurological:  Negative for loss of consciousness and headaches.       Objective:    Physical Exam Vitals and nursing note reviewed.  Constitutional:      General: She is not in acute distress.    Appearance: Normal appearance. She is not ill-appearing.  HENT:     Head: Normocephalic and atraumatic.     Right Ear: External ear normal.     Left Ear: External ear normal.  Eyes:     Extraocular  Movements: Extraocular movements intact.     Pupils: Pupils are equal, round, and reactive to light.  Cardiovascular:     Rate and Rhythm: Normal rate and regular rhythm.     Heart sounds: Normal heart sounds. No murmur heard.    No gallop.  Pulmonary:     Effort: Pulmonary effort is normal. No respiratory distress.     Breath sounds: Normal breath sounds. No wheezing or rales.  Abdominal:     General: There is no distension.     Palpations: Abdomen is soft.     Tenderness: There is abdominal tenderness (LUQ) in the epigastric area. There is no guarding.     Comments: Pain shoots to back between shoulder blades   Skin:    General: Skin is warm and dry.  Neurological:     Mental Status: She is alert and oriented to person, place, and time.  Psychiatric:        Mood and Affect: Mood normal.        Behavior: Behavior normal.        Judgment: Judgment normal.    BP 110/78 (BP Location: Left Arm, Patient Position: Sitting, Cuff Size: Normal)   Pulse 76   Temp 98.1 F (36.7 C) (Oral)   Resp 18   Ht '5\' 6"'$  (1.676 m)   Wt 221 lb 3.2 oz (100.3 kg)   SpO2 95%   BMI 35.70 kg/m  Wt Readings from Last 3 Encounters:  07/06/22 221 lb 3.2 oz (100.3 kg)  03/26/22 225 lb 12.8 oz (102.4 kg)  02/27/22 225 lb 6.4 oz (102.2 kg)  Assessment & Plan:  Chest pain, unspecified type -     EKG 12-Lead -     Comprehensive metabolic panel; Future -     H. pylori antibody, IgG; Future -     Lipid panel; Future -     TSH; Future -     CBC with Differential/Platelet; Future  Dyspepsia -     Comprehensive metabolic panel; Future -     H. pylori antibody, IgG; Future -     Lipid panel; Future -     TSH; Future -     CBC with Differential/Platelet; Future    I, [name], DO, personally preformed the services described in this documentation.  All medical record entries made by the scribe were at my direction and in my presence.  I have reviewed the chart and discharge  instructions (if applicable) and agree that the record reflects my personal performance and is accurate and complete. 07/06/2022   I,Shehryar Baig,acting as a scribe for [name], DO.,have documented all relevant documentation on the behalf of [name], DO,as directed by  [name], DO while in the presence of [name], DO.   [name], DO

## 2022-07-09 ENCOUNTER — Other Ambulatory Visit (INDEPENDENT_AMBULATORY_CARE_PROVIDER_SITE_OTHER): Payer: Self-pay

## 2022-07-09 DIAGNOSIS — R079 Chest pain, unspecified: Secondary | ICD-10-CM

## 2022-07-09 DIAGNOSIS — R1013 Epigastric pain: Secondary | ICD-10-CM

## 2022-07-09 LAB — CBC WITH DIFFERENTIAL/PLATELET
Basophils Absolute: 0 10*3/uL (ref 0.0–0.1)
Basophils Relative: 0.6 % (ref 0.0–3.0)
Eosinophils Absolute: 0.1 10*3/uL (ref 0.0–0.7)
Eosinophils Relative: 2 % (ref 0.0–5.0)
HCT: 40.2 % (ref 36.0–46.0)
Hemoglobin: 13.3 g/dL (ref 12.0–15.0)
Lymphocytes Relative: 46.5 % — ABNORMAL HIGH (ref 12.0–46.0)
Lymphs Abs: 3.3 10*3/uL (ref 0.7–4.0)
MCHC: 33.1 g/dL (ref 30.0–36.0)
MCV: 86.7 fl (ref 78.0–100.0)
Monocytes Absolute: 0.5 10*3/uL (ref 0.1–1.0)
Monocytes Relative: 6.8 % (ref 3.0–12.0)
Neutro Abs: 3.2 10*3/uL (ref 1.4–7.7)
Neutrophils Relative %: 44.1 % (ref 43.0–77.0)
Platelets: 288 10*3/uL (ref 150.0–400.0)
RBC: 4.64 Mil/uL (ref 3.87–5.11)
RDW: 13.8 % (ref 11.5–15.5)
WBC: 7.2 10*3/uL (ref 4.0–10.5)

## 2022-07-09 LAB — LIPID PANEL
Cholesterol: 100 mg/dL (ref 0–200)
HDL: 36.3 mg/dL — ABNORMAL LOW (ref >39.00)
LDL Cholesterol: 42 mg/dL (ref 0–99)
NonHDL: 63.46
Total CHOL/HDL Ratio: 3
Triglycerides: 106 mg/dL (ref 0.0–149.0)
VLDL: 21.2 mg/dL (ref 0.0–40.0)

## 2022-07-09 LAB — COMPREHENSIVE METABOLIC PANEL
ALT: 23 U/L (ref 0–35)
AST: 20 U/L (ref 0–37)
Albumin: 4.4 g/dL (ref 3.5–5.2)
Alkaline Phosphatase: 39 U/L (ref 39–117)
BUN: 12 mg/dL (ref 6–23)
CO2: 31 mEq/L (ref 19–32)
Calcium: 9.2 mg/dL (ref 8.4–10.5)
Chloride: 102 mEq/L (ref 96–112)
Creatinine, Ser: 0.59 mg/dL (ref 0.40–1.20)
GFR: 99.82 mL/min (ref >60.00)
Glucose, Bld: 147 mg/dL — ABNORMAL HIGH (ref 70–99)
Potassium: 4 mEq/L (ref 3.5–5.1)
Sodium: 142 mEq/L (ref 135–145)
Total Bilirubin: 0.4 mg/dL (ref 0.2–1.2)
Total Protein: 6.7 g/dL (ref 6.0–8.3)

## 2022-07-09 LAB — H. PYLORI ANTIBODY, IGG: H Pylori IgG: NEGATIVE

## 2022-07-09 LAB — TSH: TSH: 1.02 u[IU]/mL (ref 0.35–5.50)

## 2022-07-10 ENCOUNTER — Telehealth: Payer: Self-pay | Admitting: Family Medicine

## 2022-07-10 DIAGNOSIS — K219 Gastro-esophageal reflux disease without esophagitis: Secondary | ICD-10-CM

## 2022-07-10 DIAGNOSIS — R079 Chest pain, unspecified: Secondary | ICD-10-CM

## 2022-07-10 DIAGNOSIS — M542 Cervicalgia: Secondary | ICD-10-CM

## 2022-07-10 NOTE — Telephone Encounter (Signed)
Patient states she was here on 01/12 and some medications were supposed to be sent to her pharmacy. She is unaware of the names of the medications but nothing has been sent. Please advise.

## 2022-07-11 MED ORDER — CELECOXIB 100 MG PO CAPS: 100.0000 mg | ORAL_CAPSULE | Freq: Every day | ORAL | 2 refills | Status: AC

## 2022-07-11 MED ORDER — LOSARTAN POTASSIUM 50 MG PO TABS: 50.0000 mg | ORAL_TABLET | Freq: Every day | ORAL | 1 refills | Status: DC

## 2022-07-11 MED ORDER — CYCLOBENZAPRINE HCL 10 MG PO TABS: 10.0000 mg | ORAL_TABLET | Freq: Three times a day (TID) | ORAL | 0 refills | Status: DC | PRN

## 2022-07-11 MED ORDER — PANTOPRAZOLE SODIUM 40 MG PO TBEC: 40.0000 mg | DELAYED_RELEASE_TABLET | Freq: Every day | ORAL | 1 refills | Status: DC

## 2022-07-11 NOTE — Telephone Encounter (Signed)
Reviewed patients chart. Refilled meds that appear needed refills

## 2022-07-23 NOTE — Progress Notes (Deleted)
NEUROLOGY FOLLOW UP OFFICE NOTE  ARCILIA AEBERSOLD [id number]  Assessment/Plan:   1.  Migraine without aura, without status migrainosus, not intractable 2.  Vertigo, possibly migraine-associated vs cervicogenic dizziness 3.  Right sided cervical radiculitis    For migraine prevention:  Stop propranolol.  Start nortriptyline '10mg'$  at bedtime.  Will have her fill out form for [employer] in case we need to start Emgality.  Would not use topiramate as she has history of recurrent kidney stones.  Already tried propranolol. For migraine rescue:  May use Excedrin Migraine.  Limit use of pain relievers to no more than 2 days out of week to prevent risk of rebound or medication-overuse headache. For vertigo spells:  May use meclizine if absolutely needed (limit use) and Zofran for nausea Consider following up with the spine surgeon to discuss possible epidural injection       Subjective:  Felicia Solis is a 57 year old female who follows up for dizziness and headache.   UPDATE: Emgality ***  Dizzy spells occurring once to twice a week.  Duration is prolonged to 15 to 20 minutes ***  Current NSAIDs:  ASA '81mg'$  daily, Celebrex Current analgesics:  Tylenol; Excedrin Migraine (hasn't been able to find in the store, which works better than Tylenol). Current antiemetic:  Zofran '4mg'$  Current muscle relaxant:  Flexeril Current antihypertensive:  losartan Current antidepressant:  Citalopram '20mg'$  Current antihistamine/decongestants:  meclizine, Astelin; Flonase   HISTORY: In December 2020, she began experiencing dizziness described as spinning sensation.  It first occurred upon getting up after she was bent over in the kitchen to clean the floor.  However, it occurs spontaneously as well.  It would last a couple of minutes.  No visual disturbance, nausea, vomiting, photophobia, phonophobia, weakness, slurred speech or headache.  She also felt that her left ear was clogged up and would pop.  She  initially went to her PCP who found cerumen impaction which was removed and she was prescribed seasonal allergies and amoxicillin for possible otitis media.  However, she continued to have these spells.  She was evaluated by ENT, Dr. Constance Holster.  Her exam was unremarkable and she reported no recent episodes.  She was advised to follow up if symptoms return.  However, the vertigo returned.  She also started developing aural fullness in her right ear as well (like she was on an airplane) associated with muffled hearing.  No tinnitus.  On one occasion, the episode was followed by a 7/10 right pressure-like retro-orbital headache lasting for about an hour.  Associated with photophobia and phonophobia but not nausea, vomiting or visual disturbance.  On occasion, she has a pressure on the top of her head. Sometimes she feels disoriented.  Not with the headaches but may occur prior to a headache.  She reports remote history of migraines.  She had an MRI of brain without contrast on 09/09/2020 showed mild to moderate nonspecific changes in the cerebral white matter, likely chronic small vessel ischemic changes or sequela of migraine.    In 2020,  she developed a right cervical radiculopathy with pain and numbness.  MRI of cervical spine from 12/24/2018 was personally reviewed and showed C6-7 disc extrusion with right-sided spinal stenosis and neural foraminal stenosis affecting the C7 nerve root.  She saw the spine specialist who recommended surgery but patient declined.         Past NSAIDs:  Diclofenac; meloxicam; naproxen Past analgesic:  Tylenol; tramadol Past anti-emetic:  Zofran '8mg'$  Past muscle  relaxant:  robaxin Past antihypertensive:  propranolol, HCTZ; lisinopril Past antidepressant:  Sertraline Past antiepileptic:  Gabapentin. Would avoid topiramate due to history of recurrent kidney stones. Past antihistamine/decongestant:  Claritin  PAST MEDICAL HISTORY: Past Medical History:  Diagnosis Date   Anemia     anemia  when pregnant in 2002   Anxiety    Arthritis    "knees and back" (09/28/2016)   Asthma 5 yrs ago    told she had asthma.. does not remember doctors name.    Chronic lower back pain    Depression    takes citalopram   History of hiatal hernia    History of kidney stones    Hyperlipidemia    Hypothyroidism    LBBB (left bundle branch block)    rate-related; identified on stress test 03/21/16   Migraine    "weekly" (09/28/2016)   Positive TB test 2008   pt states she took 9 months of medicine for positive tb skin test   Shortness of breath    with exertion   Thyroid disease    Type II diabetes mellitus ([place]) dx'd 2016    MEDICATIONS: Current Outpatient Medications on File Prior to Visit  Medication Sig Dispense Refill   Ascorbic Acid (VITAMIN C) 100 MG tablet Take 100 mg by mouth daily.     blood glucose meter kit and supplies KIT Dispense based on patient and insurance preference. Use up to four times daily as directed. (FOR ICD-9 250.00, 250.01). 1 each 0   celecoxib (CELEBREX) 100 MG capsule Take 1 capsule (100 mg total) by mouth daily. 30 capsule 2   Cholecalciferol (VITAMIN D3) 2000 units TABS Take 2,000 Units by mouth daily.      citalopram (CELEXA) 40 MG tablet Take 1 tablet (40 mg total) by mouth daily. 90 tablet 3   cyanocobalamin 500 MCG tablet Take 1,000 mcg by mouth daily.      cyclobenzaprine (FLEXERIL) 10 MG tablet Take 1 tablet (10 mg total) by mouth 3 (three) times daily as needed for muscle spasms. 30 tablet 0   dorzolamide-timolol (COSOPT) 22.3-6.8 MG/ML ophthalmic solution 1 drop 2 (two) times daily.     Flaxseed, Linseed, (FLAXSEED OIL PO) Take 1,300 mg by mouth daily.     fluticasone (FLONASE) 50 MCG/ACT nasal spray Place 2 sprays into both nostrils daily. 16 g 6   fluticasone (FLONASE) 50 MCG/ACT nasal spray Place 2 sprays into both nostrils daily. 16 g 6   glimepiride (AMARYL) 2 MG tablet Take 1 tablet (2 mg total) by mouth daily before breakfast. 90  tablet 1   glucose blood (ONETOUCH VERIO) test strip Onetouch Verio Check blood sugar twice daily 100 each 12   indapamide (LOZOL) 1.25 MG tablet Take 1 tablet by mouth once daily 90 tablet 1   levothyroxine (SYNTHROID) 88 MCG tablet Take 1 tablet (88 mcg total) by mouth daily before breakfast. 90 tablet 1   losartan (COZAAR) 50 MG tablet Take 1 tablet (50 mg total) by mouth daily. 90 tablet 1   meclizine (ANTIVERT) 25 MG tablet Take 1 tablet (25 mg total) by mouth 3 (three) times daily as needed for dizziness. 30 tablet 5   nortriptyline (PAMELOR) 10 MG capsule Take 1 capsule (10 mg total) by mouth at bedtime. 30 capsule 5   ofloxacin (FLOXIN OTIC) 0.3 % OTIC solution Place 10 drops into the right ear daily. 5 mL 0   Omega-3 Fatty Acids (FISH OIL) 1200 MG CAPS Take 1,200 mg by mouth  daily.     ondansetron (ZOFRAN) 4 MG tablet Take 1 tablet (4 mg total) by mouth every 8 (eight) hours as needed for nausea or vomiting. 20 tablet 5   OneTouch Delica Lancets 99991111 MISC 1 Device by Does not apply route 2 (two) times daily as needed. 60 each 5   pantoprazole (PROTONIX) 40 MG tablet Take 1 tablet (40 mg total) by mouth daily. 90 tablet 1   rosuvastatin (CRESTOR) 20 MG tablet Take 1 tablet (20 mg total) by mouth daily. 90 tablet 1   Semaglutide, 2 MG/DOSE, 8 MG/3ML SOPN Inject 2 mg as directed once a week. 3 mL 3   timolol (TIMOPTIC) 0.5 % ophthalmic solution 1 drop 2 (two) times daily.     No current facility-administered medications on file prior to visit.    ALLERGIES: Allergies  Allergen Reactions   Lisinopril Cough    FAMILY HISTORY: Family History  Problem Relation Age of Onset   Diabetes Mother    Hypertension Mother    Hyperlipidemia Mother    Gallbladder disease Mother    Heart disease Mother    Diabetes Sister    Hypertension Father    Hyperlipidemia Father    Colon polyps Father    Ovarian cancer Maternal Grandmother    Gallbladder disease Maternal Grandmother    Breast cancer  Maternal Grandmother 23   Colon cancer Paternal Grandfather    Breast cancer Maternal Aunt        late 24s   Heart disease Other        early cad   Diabetes Brother    Heart attack Brother    Anesthesia problems Neg Hx    Hypotension Neg Hx    Malignant hyperthermia Neg Hx    Pseudochol deficiency Neg Hx    Esophageal cancer Neg Hx       Objective:  *** General: No acute distress.  Patient appears well-groomed.   Head:  Normocephalic/atraumatic Eyes:  Fundi examined but not visualized Neck: supple, right greater than left paraspinal tenderness, full range of motion Heart:  Regular rate and rhythm Neurological Exam: ***   Felicia Clines, DO  CC: Roma Schanz, DO

## 2022-07-24 ENCOUNTER — Ambulatory Visit: Payer: Self-pay | Admitting: Neurology

## 2022-08-17 ENCOUNTER — Other Ambulatory Visit: Payer: Self-pay | Admitting: Family Medicine

## 2022-09-04 ENCOUNTER — Telehealth: Payer: Self-pay

## 2022-09-04 NOTE — Telephone Encounter (Signed)
Spoke with patient. Pt made aware that Ozempic 2 MG has arrived. Placed in the back frig

## 2022-09-24 ENCOUNTER — Ambulatory Visit (INDEPENDENT_AMBULATORY_CARE_PROVIDER_SITE_OTHER): Payer: Self-pay | Admitting: Internal Medicine

## 2022-09-24 ENCOUNTER — Encounter: Payer: Self-pay | Admitting: Internal Medicine

## 2022-09-24 VITALS — BP 136/82 | HR 76 | Temp 98.2°F | Resp 16 | Ht 66.0 in | Wt 225.2 lb

## 2022-09-24 DIAGNOSIS — M7062 Trochanteric bursitis, left hip: Secondary | ICD-10-CM

## 2022-09-24 DIAGNOSIS — R21 Rash and other nonspecific skin eruption: Secondary | ICD-10-CM

## 2022-09-24 MED ORDER — HYDROCORTISONE 2.5 % EX CREA: TOPICAL_CREAM | Freq: Two times a day (BID) | CUTANEOUS | 0 refills | Status: DC

## 2022-09-24 NOTE — Progress Notes (Signed)
Subjective:    Patient ID: Felicia Solis, female    DOB: 07/07/1965, 57 y.o.   MRN: [id number]  DOS:  09/24/2022 Type of visit - description: Acute  2 days ago developed few bumps at the left side of the neck.  They are itching, denies pain.  No burning-like sensation.  Also yesterday developed pain around the left knee and the the lateral aspect of the left hip. No fall, no injury, no distal paresthesias. No low back pain per se She took a couple of ibuprofens and is feeling better.   Review of Systems See above   Past Medical History:  Diagnosis Date   Anemia    anemia  when pregnant in 2002   Anxiety    Arthritis    "knees and back" (09/28/2016)   Asthma 5 yrs ago    told she had asthma.. does not remember doctors name.    Chronic lower back pain    Depression    takes citalopram   History of hiatal hernia    History of kidney stones    Hyperlipidemia    Hypothyroidism    LBBB (left bundle branch block)    rate-related; identified on stress test 03/21/16   Migraine    "weekly" (09/28/2016)   Positive TB test 2008   pt states she took 9 months of medicine for positive tb skin test   Shortness of breath    with exertion   Thyroid disease    Type II diabetes mellitus dx'd 2016    Past Surgical History:  Procedure Laterality Date   DILATION AND CURETTAGE OF UTERUS  1999   ENDOMETRIAL ABLATION     EXTRACORPOREAL SHOCK WAVE LITHOTRIPSY Right 04/02/2019   Procedure: EXTRACORPOREAL SHOCK WAVE LITHOTRIPSY (ESWL);  Surgeon: Ceasar Mons, MD;  Location: [facility];  Service: Urology;  Laterality: Right;   LAPAROSCOPY     years ago ..pt does not know where surgery was done ... states" my stomach would blow up then go down"   THYROIDECTOMY  09/28/2011   Procedure: THYROIDECTOMY;  Surgeon: Izora Gala, MD;  Location: [facility];  Service: ENT;  Laterality: Right;  RIGHT THYROIDECTOMY WITH FROZEN SECTION   THYROIDECTOMY  09/28/2016   completion of thyroidectomy/notes 09/28/2016    THYROIDECTOMY Left 09/28/2016   Procedure: COMPLETION OF THYROIDECTOMY;  Surgeon: Izora Gala, MD;  Location: [facility];  Service: ENT;  Laterality: Left;   TUBAL LIGATION      Current Outpatient Medications  Medication Instructions   blood glucose meter kit and supplies KIT Dispense based on patient and insurance preference. Use up to four times daily as directed. (FOR ICD-9 250.00, 250.01).   celecoxib (CELEBREX) 100 mg, Oral, Daily   citalopram (CELEXA) 40 mg, Oral, Daily   cyanocobalamin (VITAMIN B12) 1,000 mcg, Oral, Daily   cyclobenzaprine (FLEXERIL) 10 mg, Oral, 3 times daily PRN   dorzolamide-timolol (COSOPT) 22.3-6.8 MG/ML ophthalmic solution 1 drop, 2 times daily   Fish Oil 1,200 mg, Oral, Daily   Flaxseed, Linseed, (FLAXSEED OIL PO) 1,300 mg, Oral, Daily   fluticasone (FLONASE) 50 MCG/ACT nasal spray 2 sprays, Each Nare, Daily   glimepiride (AMARYL) 2 mg, Oral, Daily before breakfast   glucose blood (ONETOUCH VERIO) test strip Onetouch Verio Check blood sugar twice daily   indapamide (LOZOL) 1.25 mg, Oral, Daily   levothyroxine (SYNTHROID) 88 mcg, Oral, Daily before breakfast   losartan (COZAAR) 50 mg, Oral, Daily   meclizine (ANTIVERT) 25 mg, Oral, 3 times daily PRN  nortriptyline (PAMELOR) 10 mg, Oral, Daily at bedtime   ofloxacin (FLOXIN OTIC) 0.3 % OTIC solution 10 drops, Right EAR, Daily   ondansetron (ZOFRAN) 4 mg, Oral, Every 8 hours PRN   OneTouch Delica Lancets [id number] MISC 1 Device, Does not apply, 2 times daily PRN   pantoprazole (PROTONIX) 40 mg, Oral, Daily   rosuvastatin (CRESTOR) 20 mg, Oral, Daily   Semaglutide (2 MG/DOSE) 2 mg, Injection, Weekly   timolol (TIMOPTIC) 0.5 % ophthalmic solution 1 drop, 2 times daily   vitamin C 100 mg, Oral, Daily   Vitamin D3 2,000 Units, Oral, Daily       Objective:   Physical Exam Neck:      Comments: Has 3 or 4 very small bumps, 2 mm in size, no blisters, no redness   BP 136/82   Pulse 76   Temp 98.2 F (36.8 C)  (Oral)   Resp 16   Ht 5\' 6"  (1.676 m)   Wt 225 lb 4 oz (102.2 kg)   SpO2 98%   BMI 36.36 kg/m  General:   Well developed, NAD, BMI noted. HEENT:  Normocephalic . Face symmetric, atraumatic Lower extremities: no pretibial edema bilaterally. Hip knees range of motion normal. Slightly TTP of the left trochanteric bursa Skin: See graphic  neurologic:  alert & oriented X3.  Speech normal, gait appropriate for age and unassisted Psych--  Cognition and judgment appear intact.  Cooperative with normal attention span and concentration.  Behavior appropriate. No anxious or depressed appearing.      Assessment   57 year old female, PMH includes DM, hypothyroidism, HTN, kidney stones, glaucoma, vertigo, C-spine DJD with chronic neck pain, saw Ortho 08/01/2021, presents with:  Left knee pain: Started yesterday, self resolved. Left hip pain: Likely trochanteric bursitis, recommend ice, judicious use of ibuprofen with GI precautions, see AVS.  Call if not better Rash, left neck: Rash is itchy, etiology unclear, could be insect bite vs atypical shingles?  Recommend steroids topically, call if not better particularly if she developed pain and blisters.  ? Valtrex. Also, reports a discoloration of the fifth left toenail.  Currently there is covered with nail polish and I am unable to see it.

## 2022-09-24 NOTE — Patient Instructions (Signed)
Neck rash: Applying the cream I am sending twice daily for the next few days. Call anytime if the rash spreads or you see blisters or you develop pain in the area.  Hip pain: Possibly bursitis Apply ice twice daily.  IBUPROFEN (Advil or Motrin) 200 mg 1 or 2 tablets every 12 hours as needed for pain.  Always take it with food because may cause gastritis and ulcers.  If you notice nausea, stomach pain, change in the color of stools --->  Stop the medicine and let us know

## 2022-10-24 ENCOUNTER — Encounter: Payer: Self-pay | Admitting: Family Medicine

## 2022-10-24 ENCOUNTER — Ambulatory Visit (INDEPENDENT_AMBULATORY_CARE_PROVIDER_SITE_OTHER): Payer: Self-pay | Admitting: Family Medicine

## 2022-10-24 VITALS — BP 133/81 | HR 80 | Ht 66.0 in | Wt 221.0 lb

## 2022-10-24 DIAGNOSIS — G4719 Other hypersomnia: Secondary | ICD-10-CM

## 2022-10-24 DIAGNOSIS — B351 Tinea unguium: Secondary | ICD-10-CM

## 2022-10-24 DIAGNOSIS — R5383 Other fatigue: Secondary | ICD-10-CM

## 2022-10-24 MED ORDER — CICLOPIROX 8 % EX SOLN: Freq: Every day | CUTANEOUS | 0 refills | Status: DC

## 2022-10-24 NOTE — Patient Instructions (Signed)
Labs today to evaluate for fatigue Referral to pulmonology for sleep apnea See handout about nail fungus - sending in Penlac topical solution

## 2022-10-24 NOTE — Progress Notes (Signed)
Acute Office Visit  Subjective:     Patient ID: Felicia Solis, female    DOB: 01-28-1966, 57 y.o.   MRN: [id number]  Chief Complaint  Patient presents with   Fatigue    HPI Patient is in today for fatigue, low energy.  Fatigue: Patient complains of fatigue. Symptoms began about a month ago. Symptoms of her fatigue have been  feeling tired, wanting to nap if she had the time . Patient describes the following psychologic symptoms: none.  Patient denies fever, significant change in weight, unusual rashes, cold intolerance, constipation and change in hair texture., GI blood loss, and witnessed or suspected sleep apnea. Symptoms have progressed to a point and plateaued. Severity has been symptoms bothersome, but easily able to carry out all usual work/school/family activities. Previous visits for this problem: none. She just recently switched to 2nd shift. She gets home around midnight, showers and gets ready for bed. She usually falls asleep 1:00 am and then wakes up for about 45 minutes around 4:00 am when her husband gets ready for work - she usually falls back asleep, but sometimes she will watch TV for an hour or so before falling back asleep. She still feels tired when she wakes up; does not feel well rested.    STOP-BANG for SLEEP APNEA Do you Snore loudly? Yes Do you often feel Tired during day? Yes Has anyone Observed you stop breathing? No History of high blood Pressure? no BMI >35? Yes Age >50? Yes Neck circumference >16 in? No Gender female? No 5-8 = high risk 3-4 = intermediate 0-2 = low risk   Epworth Sleepiness Scale = 6   ROS All review of systems negative except what is listed in the HPI      Objective:    BP 133/81   Pulse 80   Ht 5\' 6"  (1.676 m)   Wt 221 lb (100.2 kg)   SpO2 100%   BMI 35.67 kg/m    Physical Exam Vitals reviewed.  Constitutional:      Appearance: Normal appearance.  Cardiovascular:     Rate and Rhythm: Normal rate and regular  rhythm.     Pulses: Normal pulses.     Heart sounds: Normal heart sounds.  Pulmonary:     Effort: Pulmonary effort is normal.     Breath sounds: Normal breath sounds.  Musculoskeletal:     Right lower leg: No edema.     Left lower leg: No edema.  Skin:    General: Skin is warm and dry.     Comments: Left great toe with onychomycosis   Neurological:     Mental Status: She is alert and oriented to person, place, and time.  Psychiatric:        Mood and Affect: Mood normal.        Behavior: Behavior normal.        Thought Content: Thought content normal.        Judgment: Judgment normal.     No results found for any visits on 10/24/22.      Assessment & Plan:   Problem List Items Addressed This Visit   None Visit Diagnoses     Excessive daytime sleepiness    -  Primary Could be related to her work/sleep schedule Intermediate risk for OSA per STOPBANG score - refer to pulmonology    Relevant Orders   Ambulatory referral to Pulmonology   Fatigue, unspecified type     Starting labs and sleep apnea work  up Encouraged regular exercise and healthy diet Sleep hygiene discussed    Relevant Orders   Ambulatory referral to Pulmonology   CBC with Differential/Platelet   Comprehensive metabolic panel   TSH   Vitamin B12   Onychomycosis       Relevant Medications   ciclopirox (PENLAC) 8 % solution       Meds ordered this encounter  Medications   ciclopirox (PENLAC) 8 % solution    Sig: Apply topically at bedtime. Apply over nail and surrounding skin. Apply daily over previous coat. After seven (7) days, may remove with alcohol and continue cycle.    Dispense:  6.6 mL    Refill:  0    Order Specific Question:   Supervising Provider    Answer:   Danise Edge A [[id number]]    Return if symptoms worsen or fail to improve.  Clayborne Dana, NP

## 2022-10-25 LAB — COMPREHENSIVE METABOLIC PANEL
ALT: 25 U/L (ref 0–35)
AST: 25 U/L (ref 0–37)
Albumin: 4.3 g/dL (ref 3.5–5.2)
Alkaline Phosphatase: 37 U/L — ABNORMAL LOW (ref 39–117)
BUN: 12 mg/dL (ref 6–23)
CO2: 29 mEq/L (ref 19–32)
Calcium: 9 mg/dL (ref 8.4–10.5)
Chloride: 106 mEq/L (ref 96–112)
Creatinine, Ser: 0.52 mg/dL (ref 0.40–1.20)
GFR: 102.69 mL/min (ref >60.00)
Glucose, Bld: 83 mg/dL (ref 70–99)
Potassium: 3.4 mEq/L — ABNORMAL LOW (ref 3.5–5.1)
Sodium: 142 mEq/L (ref 135–145)
Total Bilirubin: 0.6 mg/dL (ref 0.2–1.2)
Total Protein: 7.1 g/dL (ref 6.0–8.3)

## 2022-10-25 LAB — CBC WITH DIFFERENTIAL/PLATELET
Basophils Absolute: 0.1 10*3/uL (ref 0.0–0.1)
Basophils Relative: 0.9 % (ref 0.0–3.0)
Eosinophils Absolute: 0.1 10*3/uL (ref 0.0–0.7)
Eosinophils Relative: 1.6 % (ref 0.0–5.0)
HCT: 38.2 % (ref 36.0–46.0)
Hemoglobin: 12.9 g/dL (ref 12.0–15.0)
Lymphocytes Relative: 44.6 % (ref 12.0–46.0)
Lymphs Abs: 3.6 10*3/uL (ref 0.7–4.0)
MCHC: 33.8 g/dL (ref 30.0–36.0)
MCV: 86.4 fl (ref 78.0–100.0)
Monocytes Absolute: 0.7 10*3/uL (ref 0.1–1.0)
Monocytes Relative: 8.5 % (ref 3.0–12.0)
Neutro Abs: 3.6 10*3/uL (ref 1.4–7.7)
Neutrophils Relative %: 44.4 % (ref 43.0–77.0)
Platelets: 270 10*3/uL (ref 150.0–400.0)
RBC: 4.42 Mil/uL (ref 3.87–5.11)
RDW: 13.6 % (ref 11.5–15.5)
WBC: 8.2 10*3/uL (ref 4.0–10.5)

## 2022-10-25 LAB — TSH: TSH: 0.78 u[IU]/mL (ref 0.35–5.50)

## 2022-10-25 LAB — VITAMIN B12: Vitamin B-12: 744 pg/mL (ref 211–911)

## 2022-10-26 ENCOUNTER — Other Ambulatory Visit: Payer: Self-pay | Admitting: Family Medicine

## 2022-10-26 DIAGNOSIS — Z1231 Encounter for screening mammogram for malignant neoplasm of breast: Secondary | ICD-10-CM

## 2022-10-31 ENCOUNTER — Other Ambulatory Visit: Payer: Self-pay | Admitting: Family Medicine

## 2022-11-23 ENCOUNTER — Ambulatory Visit (INDEPENDENT_AMBULATORY_CARE_PROVIDER_SITE_OTHER): Payer: Self-pay | Admitting: Family Medicine

## 2022-11-23 ENCOUNTER — Encounter: Payer: Self-pay | Admitting: Family Medicine

## 2022-11-23 VITALS — BP 120/72 | HR 78 | Temp 98.2°F | Ht 66.0 in | Wt 220.1 lb

## 2022-11-23 DIAGNOSIS — M542 Cervicalgia: Secondary | ICD-10-CM

## 2022-11-23 DIAGNOSIS — S46819A Strain of other muscles, fascia and tendons at shoulder and upper arm level, unspecified arm, initial encounter: Secondary | ICD-10-CM

## 2022-11-23 DIAGNOSIS — J0100 Acute maxillary sinusitis, unspecified: Secondary | ICD-10-CM

## 2022-11-23 DIAGNOSIS — H6991 Unspecified Eustachian tube disorder, right ear: Secondary | ICD-10-CM

## 2022-11-23 MED ORDER — PREDNISONE 20 MG PO TABS
40.0000 mg | ORAL_TABLET | Freq: Every day | ORAL | 0 refills | Status: AC
Start: 2022-11-23 — End: 2022-11-28

## 2022-11-23 NOTE — Patient Instructions (Addendum)
Take 2 tabs of the Amaryl/glimepiride daily while you are on the prednisone.   Ice/cold pack over area for 10-15 min twice daily.  Heat (pad or rice pillow in microwave) over affected area, 10-15 minutes twice daily.   OK to take Tylenol 1000 mg (2 extra strength tabs) or 975 mg (3 regular strength tabs) every 6 hours as needed.  Let us know if you need anything.  EXERCISES RANGE OF MOTION (ROM) AND STRETCHING EXERCISES  These exercises may help you when beginning to rehabilitate your issue. In order to successfully resolve your symptoms, you must improve your posture. These exercises are designed to help reduce the forward-head and rounded-shoulder posture which contributes to this condition. Your symptoms may resolve with or without further involvement from your physician, physical therapist or athletic trainer. While completing these exercises, remember:  Restoring tissue flexibility helps normal motion to return to the joints. This allows healthier, less painful movement and activity. An effective stretch should be held for at least 20 seconds, although you may need to begin with shorter hold times for comfort. A stretch should never be painful. You should only feel a gentle lengthening or release in the stretched tissue. Do not do any stretch or exercise that you cannot tolerate.  STRETCH- Axial Extensors Lie on your back on the floor. You may bend your knees for comfort. Place a rolled-up hand towel or dish towel, about 2 inches in diameter, under the part of your head that makes contact with the floor. Gently tuck your chin, as if trying to make a "double chin," until you feel a gentle stretch at the base of your head. Hold 15-20 seconds. Repeat 2-3 times. Complete this exercise 1 time per day.   STRETCH - Axial Extension  Stand or sit on a firm surface. Assume a good posture: chest up, shoulders drawn back, abdominal muscles slightly tense, knees unlocked (if standing) and feet hip  width apart. Slowly retract your chin so your head slides back and your chin slightly lowers. Continue to look straight ahead. You should feel a gentle stretch in the back of your head. Be certain not to feel an aggressive stretch since this can cause headaches later. Hold for 15-20 seconds. Repeat 2-3 times. Complete this exercise 1 time per day.  STRETCH - Cervical Side Bend  Stand or sit on a firm surface. Assume a good posture: chest up, shoulders drawn back, abdominal muscles slightly tense, knees unlocked (if standing) and feet hip width apart. Without letting your nose or shoulders move, slowly tip your right / left ear to your shoulder until your feel a gentle stretch in the muscles on the opposite side of your neck. Hold 15-20 seconds. Repeat 2-3 times. Complete this exercise 1-2 times per day.  STRETCH - Cervical Rotators  Stand or sit on a firm surface. Assume a good posture: chest up, shoulders drawn back, abdominal muscles slightly tense, knees unlocked (if standing) and feet hip width apart. Keeping your eyes level with the ground, slowly turn your head until you feel a gentle stretch along the back and opposite side of your neck. Hold 15-20 seconds. Repeat 2-3 times. Complete this exercise 1-2 times per day.  RANGE OF MOTION - Neck Circles  Stand or sit on a firm surface. Assume a good posture: chest up, shoulders drawn back, abdominal muscles slightly tense, knees unlocked (if standing) and feet hip width apart. Gently roll your head down and around from the back of one shoulder to the  back of the other. The motion should never be forced or painful. Repeat the motion 10-20 times, or until you feel the neck muscles relax and loosen. Repeat 2-3 times. Complete the exercise 1-2 times per day. STRENGTHENING EXERCISES - Cervical Strain and Sprain These exercises may help you when beginning to rehabilitate your injury. They may resolve your symptoms with or without further  involvement from your physician, physical therapist, or athletic trainer. While completing these exercises, remember:  Muscles can gain both the endurance and the strength needed for everyday activities through controlled exercises. Complete these exercises as instructed by your physician, physical therapist, or athletic trainer. Progress the resistance and repetitions only as guided. You may experience muscle soreness or fatigue, but the pain or discomfort you are trying to eliminate should never worsen during these exercises. If this pain does worsen, stop and make certain you are following the directions exactly. If the pain is still present after adjustments, discontinue the exercise until you can discuss the trouble with your clinician.  STRENGTH - Cervical Flexors, Isometric Face a wall, standing about 6 inches away. Place a small pillow, a ball about 6-8 inches in diameter, or a folded towel between your forehead and the wall. Slightly tuck your chin and gently push your forehead into the soft object. Push only with mild to moderate intensity, building up tension gradually. Keep your jaw and forehead relaxed. Hold 10 to 20 seconds. Keep your breathing relaxed. Release the tension slowly. Relax your neck muscles completely before you start the next repetition. Repeat 2-3 times. Complete this exercise 1 time per day.  STRENGTH- Cervical Lateral Flexors, Isometric  Stand about 6 inches away from a wall. Place a small pillow, a ball about 6-8 inches in diameter, or a folded towel between the side of your head and the wall. Slightly tuck your chin and gently tilt your head into the soft object. Push only with mild to moderate intensity, building up tension gradually. Keep your jaw and forehead relaxed. Hold 10 to 20 seconds. Keep your breathing relaxed. Release the tension slowly. Relax your neck muscles completely before you start the next repetition. Repeat 2-3 times. Complete this exercise 1  time per day.  STRENGTH - Cervical Extensors, Isometric  Stand about 6 inches away from a wall. Place a small pillow, a ball about 6-8 inches in diameter, or a folded towel between the back of your head and the wall. Slightly tuck your chin and gently tilt your head back into the soft object. Push only with mild to moderate intensity, building up tension gradually. Keep your jaw and forehead relaxed. Hold 10 to 20 seconds. Keep your breathing relaxed. Release the tension slowly. Relax your neck muscles completely before you start the next repetition. Repeat 2-3 times. Complete this exercise 1 time per day.  POSTURE AND BODY MECHANICS CONSIDERATIONS Keeping correct posture when sitting, standing or completing your activities will reduce the stress put on different body tissues, allowing injured tissues a chance to heal and limiting painful experiences. The following are general guidelines for improved posture. Your physician or physical therapist will provide you with any instructions specific to your needs. While reading these guidelines, remember: The exercises prescribed by your provider will help you have the flexibility and strength to maintain correct postures. The correct posture provides the optimal environment for your joints to work. All of your joints have less wear and tear when properly supported by a spine with good posture. This means you will experience a  healthier, less painful body. Correct posture must be practiced with all of your activities, especially prolonged sitting and standing. Correct posture is as important when doing repetitive low-stress activities (typing) as it is when doing a single heavy-load activity (lifting).  PROLONGED STANDING WHILE SLIGHTLY LEANING FORWARD When completing a task that requires you to lean forward while standing in one place for a long time, place either foot up on a stationary 2- to 4-inch high object to help maintain the best posture. When both  feet are on the ground, the low back tends to lose its slight inward curve. If this curve flattens (or becomes too large), then the back and your other joints will experience too much stress, fatigue more quickly, and can cause pain.   RESTING POSITIONS Consider which positions are most painful for you when choosing a resting position. If you have pain with flexion-based activities (sitting, bending, stooping, squatting), choose a position that allows you to rest in a less flexed posture. You would want to avoid curling into a fetal position on your side. If your pain worsens with extension-based activities (prolonged standing, working overhead), avoid resting in an extended position such as sleeping on your stomach. Most people will find more comfort when they rest with their spine in a more neutral position, neither too rounded nor too arched. Lying on a non-sagging bed on your side with a pillow between your knees, or on your back with a pillow under your knees will often provide some relief. Keep in mind, being in any one position for a prolonged period of time, no matter how correct your posture, can still lead to stiffness.  WALKING Walk with an upright posture. Your ears, shoulders, and hips should all line up. OFFICE WORK When working at a desk, create an environment that supports good, upright posture. Without extra support, muscles fatigue and lead to excessive strain on joints and other tissues.  CHAIR: A chair should be able to slide under your desk when your back makes contact with the back of the chair. This allows you to work closely. The chair's height should allow your eyes to be level with the upper part of your monitor and your hands to be slightly lower than your elbows. Body position: Your feet should make contact with the floor. If this is not possible, use a foot rest. Keep your ears over your shoulders. This will reduce stress on your neck and low back.  Trapezius  stretches/exercises Do exercises exactly as told by your health care provider and adjust them as directed. It is normal to feel mild stretching, pulling, tightness, or discomfort as you do these exercises, but you should stop right away if you feel sudden pain or your pain gets worse.   Stretching and range of motion exercises These exercises warm up your muscles and joints and improve the movement and flexibility of your shoulder. These exercises can also help to relieve pain, numbness, and tingling. If you are unable to do any of the following for any reason, do not further attempt to do it.   Exercise A: Flexion, standing     Stand and hold a broomstick, a cane, or a similar object. Place your hands a little more than shoulder-width apart on the object. Your left / right hand should be palm-up, and your other hand should be palm-down. Push the stick to raise your left / right arm out to your side and then over your head. Use your other hand to help  move the stick. Stop when you feel a stretch in your shoulder, or when you reach the angle that is recommended by your health care provider. Avoid shrugging your shoulder while you raise your arm. Keep your shoulder blade tucked down toward your spine. Hold for 30 seconds. Slowly return to the starting position. Repeat 2 times. Complete this exercise 3 times per week.  Exercise B: Abduction, supine     Lie on your back and hold a broomstick, a cane, or a similar object. Place your hands a little more than shoulder-width apart on the object. Your left / right hand should be palm-up, and your other hand should be palm-down. Push the stick to raise your left / right arm out to your side and then over your head. Use your other hand to help move the stick. Stop when you feel a stretch in your shoulder, or when you reach the angle that is recommended by your health care provider. Avoid shrugging your shoulder while you raise your arm. Keep your shoulder  blade tucked down toward your spine. Hold for 30 seconds. Slowly return to the starting position. Repeat 2 times. Complete this exercise 3 times per week.  Exercise C: Flexion, active-assisted     Lie on your back. You may bend your knees for comfort. Hold a broomstick, a cane, or a similar object. Place your hands about shoulder-width apart on the object. Your palms should face toward your feet. Raise the stick and move your arms over your head and behind your head, toward the floor. Use your healthy arm to help your left / right arm move farther. Stop when you feel a gentle stretch in your shoulder, or when you reach the angle where your health care provider tells you to stop. Hold for 30 seconds. Slowly return to the starting position. Repeat 2 times. Complete this exercise 3 times per week.  Exercise D: External rotation and abduction     Stand in a door frame with one of your feet slightly in front of the other. This is called a staggered stance. Choose one of the following positions as told by your health care provider: Place your hands and forearms on the door frame above your head. Place your hands and forearms on the door frame at the height of your head. Place your hands on the door frame at the height of your elbows. Slowly move your weight onto your front foot until you feel a stretch across your chest and in the front of your shoulders. Keep your head and chest upright and keep your abdominal muscles tight. Hold for 30 seconds. To release the stretch, shift your weight to your back foot. Repeat 2 times. Complete this stretch 3 times per week.  Strengthening exercises These exercises build strength and endurance in your shoulder. Endurance is the ability to use your muscles for a long time, even after your muscles get tired. Exercise E: Scapular depression and adduction  Sit on a stable chair. Support your arms in front of you with pillows, armrests, or a tabletop. Keep  your elbows in line with the sides of your body. Gently move your shoulder blades down toward your middle back. Relax the muscles on the tops of your shoulders and in the back of your neck. Hold for 3 seconds. Slowly release the tension and relax your muscles completely before doing this exercise again. Repeat for a total of 10 repetitions. After you have practiced this exercise, try doing the exercise without the  arm support. Then, try the exercise while standing instead of sitting. Repeat 2 times. Complete this exercise 3 times per week.  Exercise F: Shoulder abduction, isometric     Stand or sit about 4-6 inches (10-15 cm) from a wall with your left / right side facing the wall. Bend your left / right elbow and gently press your elbow against the wall. Increase the pressure slowly until you are pressing as hard as you can without shrugging your shoulder. Hold for 3 seconds. Slowly release the tension and relax your muscles completely. Repeat for a total of 10 repetitions. Repeat 2 times. Complete this exercise 3 times per week.  Exercise G: Shoulder flexion, isometric     Stand or sit about 4-6 inches (10-15 cm) away from a wall with your left / right side facing the wall. Keep your left / right elbow straight and gently press the top of your fist against the wall. Increase the pressure slowly until you are pressing as hard as you can without shrugging your shoulder. Hold for 10-15 seconds. Slowly release the tension and relax your muscles completely. Repeat for a total of 10 repetitions. Repeat 2 times. Complete this exercise 3 times per week.  Exercise H: Internal rotation     Sit in a stable chair without armrests, or stand. Secure an exercise band at your left / right side, at elbow height. Place a soft object, such as a folded towel or a small pillow, under your left / right upper arm so your elbow is a few inches (about 8 cm) away from your side. Hold the end of the exercise  band so the band stretches. Keeping your elbow pressed against the soft object under your arm, move your forearm across your body toward your abdomen. Keep your body steady so the movement is only coming from your shoulder. Hold for 3 seconds. Slowly return to the starting position. Repeat for a total of 10 repetitions. Repeat 2 times. Complete this exercise 3 times per week.  Exercise I: External rotation     Sit in a stable chair without armrests, or stand. Secure an exercise band at your left / right side, at elbow height. Place a soft object, such as a folded towel or a small pillow, under your left / right upper arm so your elbow is a few inches (about 8 cm) away from your side. Hold the end of the exercise band so the band stretches. Keeping your elbow pressed against the soft object under your arm, move your forearm out, away from your abdomen. Keep your body steady so the movement is only coming from your shoulder. Hold for 3 seconds. Slowly return to the starting position. Repeat for a total of 10 repetitions. Repeat 2 times. Complete this exercise 3 times per week. Exercise J: Shoulder extension  Sit in a stable chair without armrests, or stand. Secure an exercise band to a stable object in front of you so the band is at shoulder height. Hold one end of the exercise band in each hand. Your palms should face each other. Straighten your elbows and lift your hands up to shoulder height. Step back, away from the secured end of the exercise band, until the band stretches. Squeeze your shoulder blades together and pull your hands down to the sides of your thighs. Stop when your hands are straight down by your sides. Do not let your hands go behind your body. Hold for 3 seconds. Slowly return to the starting position.  Repeat for a total of 10 repetitions. Repeat 2 times. Complete this exercise 3 times per week.  Exercise K: Shoulder extension, prone     Lie on your abdomen on a firm  surface so your left / right arm hangs over the edge. Hold a 5 lb weight in your hand so your palm faces in toward your body. Your arm should be straight. Squeeze your shoulder blade down toward the middle of your back. Slowly raise your arm behind you, up to the height of the surface that you are lying on. Keep your arm straight. Hold for 3 seconds. Slowly return to the starting position and relax your muscles. Repeat for a total of 10 repetitions. Repeat 2 times. Complete this exercise 3 times per week.   Exercise L: Horizontal abduction, prone  Lie on your abdomen on a firm surface so your left / right arm hangs over the edge. Hold a 5 lb weight in your hand so your palm faces toward your feet. Your arm should be straight. Squeeze your shoulder blade down toward the middle of your back. Bend your elbow so your hand moves up, until your elbow is bent to an "L" shape (90 degrees). With your elbow bent, slowly move your forearm forward and up. Raise your hand up to the height of the surface that you are lying on. Your upper arm should not move, and your elbow should stay bent. At the top of the movement, your palm should face the floor. Hold for 3 seconds. Slowly return to the starting position and relax your muscles. Repeat for a total of 10 repetitions. Repeat 2 times. Complete this exercise 3 times per week.  Exercise M: Horizontal abduction, standing  Sit on a stable chair, or stand. Secure an exercise band to a stable object in front of you so the band is at shoulder height. Hold one end of the exercise band in each hand. Straighten your elbows and lift your hands straight in front of you, up to shoulder height. Your palms should face down, toward the floor. Step back, away from the secured end of the exercise band, until the band stretches. Move your arms out to your sides, and keep your arms straight. Hold for 3 seconds. Slowly return to the starting position. Repeat for a total of  10 repetitions. Repeat 2 times. Complete this exercise 3 times per week.  Exercise N: Scapular retraction and elevation  Sit on a stable chair, or stand. Secure an exercise band to a stable object in front of you so the band is at shoulder height. Hold one end of the exercise band in each hand. Your palms should face each other. Sit in a stable chair without armrests, or stand. Step back, away from the secured end of the exercise band, until the band stretches. Squeeze your shoulder blades together and lift your hands over your head. Keep your elbows straight. Hold for 3 seconds. Slowly return to the starting position. Repeat for a total of 10 repetitions. Repeat 2 times. Complete this exercise 3 times per week.  This information is not intended to replace advice given to you by your health care provider. Make sure you discuss any questions you have with your health care provider. Document Released: 06/11/2005 Document Revised: 02/16/2016 Document Reviewed: 04/28/2015 Elsevier Interactive Patient Education  2017 [employer].

## 2022-11-23 NOTE — Progress Notes (Signed)
Chief Complaint  Patient presents with   Hypertension    Headache Had some dizziness last night. Right ear unable to hear well    Subjective: Patient is a 57 y.o. female here for HA and R ear fullness.  This AM, woke up w high BP, frontal HA and dizziness. She did not put anything in her ear. Her BP and dizziness are no more but lingering fullness in R ear and headache. No trauma to ear, drainage, fevers. Slightly decreased hearing. Does have a hx of wax buildup. +nasal congestion, no rhinorrhea, SOB, wheezing. Pain over frontal sinuses. +hx of allergies. She took Sudafed.   Past Medical History:  Diagnosis Date   Anemia    anemia  when pregnant in 2002   Anxiety    Arthritis    "knees and back" (09/28/2016)   Asthma 5 yrs ago    told she had asthma.. does not remember doctors name.    Chronic lower back pain    Depression    takes citalopram   History of hiatal hernia    History of kidney stones    Hyperlipidemia    Hypothyroidism    LBBB (left bundle branch block)    rate-related; identified on stress test 03/21/16   Migraine    "weekly" (09/28/2016)   Positive TB test 2008   pt states she took 9 months of medicine for positive tb skin test   Shortness of breath    with exertion   Thyroid disease    Type II diabetes mellitus (HCC) dx'd 2016    Objective: BP 120/72 (BP Location: Left Arm, Patient Position: Sitting, Cuff Size: Large)   Pulse 78   Temp 98.2 F (36.8 C) (Oral)   Ht 5\' 6"  (1.676 m)   Wt 220 lb 2 oz (99.8 kg)   SpO2 97%   BMI 35.53 kg/m  General: Awake, appears stated age Heart: RRR, no LE edema Lungs: CTAB, no rales, wheezes or rhonchi. No accessory muscle use Neuro: 5/5 strength throughout, DTR's equal and symmetric throughout, no clonus, no cerebellar signs, gait nml MSK: +TTP over subocc triangle, cerv parasp msc b/l, traps with hypertonicity b/l Psych: Age appropriate judgment and insight, normal affect and mood  Assessment and Plan: Neck  pain  Strain of trapezius muscle, unspecified laterality, initial encounter  Acute maxillary sinusitis, recurrence not specified - Plan: predniSONE (DELTASONE) 20 MG tablet  ETD (Eustachian tube dysfunction), right - Plan: predniSONE (DELTASONE) 20 MG tablet  Stretches/exercises, heat, ice, Tylenol.  Stretches/exercises, heat, ice, Tylenol.  5 d pred burst 40 mg/d, INCS when done and for future use. She will double her Amaryl from 2 mg/d to 4 mg/d for 5 d while on the prednisone.  Pred burst as above. INCS for future prevention.  F/u w PCP for DM visit.  The patient voiced understanding and agreement to the plan.  I spent 31 min w the pt discussing the above plans in addition to reviewing her chart on the same day of the visit.   Felicia Roche [place], DO 11/23/22  2:51 PM

## 2022-12-11 ENCOUNTER — Telehealth: Payer: Self-pay

## 2022-12-11 NOTE — Telephone Encounter (Signed)
Pt called. LVM. Ozempic has arrived and placed in the back frig.

## 2022-12-23 ENCOUNTER — Emergency Department (HOSPITAL_BASED_OUTPATIENT_CLINIC_OR_DEPARTMENT_OTHER)
Admission: EM | Admit: 2022-12-23 | Discharge: 2022-12-23 | Disposition: A | Discharge status: Home / Self Care | Payer: Worker's Compensation | Attending: Emergency Medicine | Admitting: Emergency Medicine

## 2022-12-23 ENCOUNTER — Encounter (HOSPITAL_BASED_OUTPATIENT_CLINIC_OR_DEPARTMENT_OTHER): Payer: Self-pay

## 2022-12-23 ENCOUNTER — Other Ambulatory Visit: Payer: Self-pay

## 2022-12-23 DIAGNOSIS — M545 Low back pain, unspecified: Secondary | ICD-10-CM

## 2022-12-23 DIAGNOSIS — Z7984 Long term (current) use of oral hypoglycemic drugs: Secondary | ICD-10-CM | POA: Insufficient documentation

## 2022-12-23 DIAGNOSIS — M62830 Muscle spasm of back: Secondary | ICD-10-CM | POA: Insufficient documentation

## 2022-12-23 DIAGNOSIS — Z7951 Long term (current) use of inhaled steroids: Secondary | ICD-10-CM | POA: Diagnosis not present

## 2022-12-23 DIAGNOSIS — E119 Type 2 diabetes mellitus without complications: Secondary | ICD-10-CM | POA: Diagnosis not present

## 2022-12-23 DIAGNOSIS — J45909 Unspecified asthma, uncomplicated: Secondary | ICD-10-CM | POA: Insufficient documentation

## 2022-12-23 MED ORDER — LIDOCAINE 5 % EX PTCH: 1.0000 | MEDICATED_PATCH | CUTANEOUS | 0 refills | Status: DC

## 2022-12-23 MED ORDER — CYCLOBENZAPRINE HCL 10 MG PO TABS: 10.0000 mg | ORAL_TABLET | Freq: Two times a day (BID) | ORAL | 0 refills | Status: DC | PRN

## 2022-12-23 NOTE — Discharge Instructions (Signed)
Your history, exam, and evaluation today are consistent with paraspinal muscle spasm on your back likely from the lifting injury several days ago.  Given the lack of midline tenderness and lack of focal trauma, we agreed to hold on imaging at this time.  You also had no concerning findings such as numbness, tingling, or weakness of your legs.  We feel you are safe for discharge home but please use the medications to help with the symptoms and follow-up with a primary doctor.  Please rest and stay hydrated.  If any symptoms change or worsen acutely, please return to the nearest emergency department.

## 2022-12-23 NOTE — ED Provider Notes (Signed)
EMERGENCY DEPARTMENT AT [facility] Provider Note   CSN: [id number] Arrival date & time: 12/23/22  1829     History  Chief Complaint  Patient presents with   Back Pain    Felicia Solis is a 57 y.o. female.  The history is provided by the patient and medical records. No language interpreter was used.  Back Pain Location:  Lumbar spine and thoracic spine Quality:  Aching Radiates to:  Does not radiate Pain severity:  Moderate Pain is:  Unable to specify Onset quality:  Gradual Duration:  3 days Timing:  Constant Progression:  Waxing and waning Chronicity:  Recurrent Context: lifting heavy objects and twisting   Relieved by:  Nothing Worsened by:  Twisting and bending Ineffective treatments:  None tried Associated symptoms: no abdominal pain, no bladder incontinence, no bowel incontinence, no chest pain, no dysuria, no fever, no headaches, no leg pain, no numbness, no paresthesias, no tingling and no weakness        Home Medications Prior to Admission medications   Medication Sig Start Date End Date Taking? Authorizing Provider  Ascorbic Acid (VITAMIN C) 100 MG tablet Take 100 mg by mouth daily.    [provider]  blood glucose meter kit and supplies KIT Dispense based on patient and insurance preference. Use up to four times daily as directed. (FOR ICD-9 250.00, 250.01). 09/17/17   Zola Button, Grayling Congress, DO  celecoxib (CELEBREX) 100 MG capsule Take 1 capsule (100 mg total) by mouth daily. 07/11/22   Donato Schultz, DO  Cholecalciferol (VITAMIN D3) 2000 units TABS Take 2,000 Units by mouth daily.     [provider]  ciclopirox (PENLAC) 8 % solution Apply topically at bedtime. Apply over nail and surrounding skin. Apply daily over previous coat. After seven (7) days, may remove with alcohol and continue cycle. 10/24/22   Clayborne Dana, NP  citalopram (CELEXA) 40 MG tablet Take 1 tablet (40 mg total) by mouth daily. 03/26/22   Donato Schultz, DO  cyanocobalamin 500 MCG tablet Take 1,000 mcg by mouth daily.     [provider]  cyclobenzaprine (FLEXERIL) 10 MG tablet Take 1 tablet (10 mg total) by mouth 3 (three) times daily as needed for muscle spasms. 07/11/22   Seabron Spates R, DO  dorzolamide-timolol (COSOPT) 22.3-6.8 MG/ML ophthalmic solution 1 drop 2 (two) times daily. 01/19/22   [provider]  Flaxseed, Linseed, (FLAXSEED OIL PO) Take 1,300 mg by mouth daily.    [provider]  fluticasone (FLONASE) 50 MCG/ACT nasal spray Place 2 sprays into both nostrils daily. 03/26/22   Donato Schultz, DO  glimepiride (AMARYL) 2 MG tablet TAKE 1 TABLET BY MOUTH ONCE DAILY BEFORE BREAKFAST 08/17/22   Zola Button, Grayling Congress, DO  glucose blood (ONETOUCH VERIO) test strip [facility] Check blood sugar twice daily 05/10/20   Seabron Spates R, DO  hydrocortisone 2.5 % cream Apply topically 2 (two) times daily. 09/24/22 09/24/23  Zurie Plump, MD  indapamide (LOZOL) 1.25 MG tablet Take 1 tablet by mouth once daily 04/17/22   Zola Button, Grayling Congress, DO  levothyroxine (SYNTHROID) 88 MCG tablet TAKE 1 TABLET BY MOUTH ONCE DAILY BEFORE BREAKFAST 10/31/22   Zola Button, Grayling Congress, DO  losartan (COZAAR) 50 MG tablet Take 1 tablet (50 mg total) by mouth daily. 07/11/22   Donato Schultz, DO  meclizine (ANTIVERT) 25 MG tablet Take 1 tablet (25 mg  total) by mouth 3 (three) times daily as needed for dizziness. 12/02/20   Drema Dallas, DO  nortriptyline (PAMELOR) 10 MG capsule Take 1 capsule (10 mg total) by mouth at bedtime. 02/27/22   Drema Dallas, DO  Omega-3 Fatty Acids (FISH OIL) 1200 MG CAPS Take 1,200 mg by mouth daily.    [provider]  ondansetron (ZOFRAN) 4 MG tablet Take 1 tablet (4 mg total) by mouth every 8 (eight) hours as needed for nausea or vomiting. 12/02/20   Drema Dallas, DO  OneTouch Delica Lancets 30G MISC 1 Device by Does not apply route 2 (two) times daily as needed.  05/10/20   Donato Schultz, DO  pantoprazole (PROTONIX) 40 MG tablet Take 1 tablet (40 mg total) by mouth daily. 07/11/22   Donato Schultz, DO  rosuvastatin (CRESTOR) 20 MG tablet Take 1 tablet (20 mg total) by mouth daily. 04/30/22   Donato Schultz, DO  Semaglutide, 2 MG/DOSE, 8 MG/3ML SOPN Inject 2 mg as directed once a week. 04/20/22   Seabron Spates R, DO  timolol (TIMOPTIC) 0.5 % ophthalmic solution 1 drop 2 (two) times daily. 11/30/21   [provider]      Allergies    Lisinopril    Review of Systems   Review of Systems  Constitutional:  Negative for chills, fatigue and fever.  HENT:  Negative for congestion.   Respiratory:  Negative for cough, chest tightness, shortness of breath and wheezing.   Cardiovascular:  Negative for chest pain and palpitations.  Gastrointestinal:  Negative for abdominal pain, bowel incontinence, constipation, diarrhea, nausea and vomiting.  Genitourinary:  Negative for bladder incontinence, dysuria, flank pain, frequency, vaginal bleeding, vaginal discharge and vaginal pain.  Musculoskeletal:  Positive for back pain. Negative for neck pain and neck stiffness.  Skin:  Negative for rash and wound.  Neurological:  Negative for dizziness, tingling, weakness, light-headedness, numbness, headaches and paresthesias.  Psychiatric/Behavioral:  Negative for agitation and confusion.   All other systems reviewed and are negative.   Physical Exam Updated Vital Signs BP 118/78 (BP Location: Right Arm)   Pulse 72   Temp 98.6 F (37 C) (Oral)   Resp 18   Ht 5\' 6"  (1.676 m)   Wt 99.8 kg   SpO2 100%   BMI 35.51 kg/m  Physical Exam Vitals and nursing note reviewed.  Constitutional:      General: She is not in acute distress.    Appearance: She is well-developed. She is not ill-appearing, toxic-appearing or diaphoretic.  HENT:     Head: Normocephalic and atraumatic.     Nose: No congestion or rhinorrhea.     Mouth/Throat:      Pharynx: No oropharyngeal exudate.  Eyes:     Conjunctiva/sclera: Conjunctivae normal.  Cardiovascular:     Rate and Rhythm: Normal rate and regular rhythm.     Heart sounds: No murmur heard. Pulmonary:     Effort: Pulmonary effort is normal. No respiratory distress.     Breath sounds: Normal breath sounds. No wheezing, rhonchi or rales.  Chest:     Chest wall: No tenderness.  Abdominal:     General: Abdomen is flat.     Palpations: Abdomen is soft.     Tenderness: There is no abdominal tenderness. There is no guarding or rebound.  Musculoskeletal:        General: Tenderness present. No swelling.     Cervical back: Neck supple. Spasms and tenderness  present.     Thoracic back: Spasms and tenderness present.     Lumbar back: Spasms and tenderness present. Negative right straight leg raise test and negative left straight leg raise test.       Back:     Comments: Paraspinal muscle spasm and tenderness in the left back.  No midline tenderness.  Exam otherwise unremarkable.  Straight leg raise negative and there was no numbness, tingling, or weakness in extremities.  Skin:    General: Skin is warm and dry.     Capillary Refill: Capillary refill takes less than 2 seconds.     Findings: No erythema.  Neurological:     Mental Status: She is alert.     Sensory: No sensory deficit.     Motor: No weakness.  Psychiatric:        Mood and Affect: Mood normal.     ED Results / Procedures / Treatments   Labs (all labs ordered are listed, but only abnormal results are displayed) Labs Reviewed - No data to display  EKG None  Radiology No results found.  Procedures Procedures    Medications Ordered in ED Medications - No data to display  ED Course/ Medical Decision Making/ A&P                             Medical Decision Making   ANNITTA GILLAN is a 57 y.o. female with a past medical history significant for diabetes, migraines, asthma, thyroid disease, anxiety, depression,  and report of some chronic back pain who presents with left-sided back pain after a lifting injury.  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  Patient felt pain in her left back and feels like it is bothering her.  She reports the pain has not been responding to Motrin and Tylenol.  She denies any numbness, or weakness of legs.  Denies incontinence of stool or urine.  Denies any other focal trauma or any falls or self.  She denies history of previous back surgery.  She reports she has had this happen in the past and had to be on medications.  She has no history of back surgery.  Otherwise she denies fevers, chills, nausea, vomiting, constipation, diarrhea, or urinary changes.  On exam, lungs clear.  Chest nontender.  Abdomen nontender.  Straight leg raise negative bilaterally.  Intact sensation, strength, and pulses in legs.  Patient had tenderness in her left back going towards her neck with palpable spasm.  No rash seen.  Midline was nontender.  No crepitance.  Patient otherwise resting comfortably.  Had a shared decision-making conversation with patient.  Given her lack of focal fall, impact, or trauma, we agreed to hold on imaging at this time but we will give prescription for medications for what we suspect is significant muscle spasm and pain.  Will give muscle relaxant and Lidoderm patches and she will follow-up with her PCP.  We agreed to treat more conservatively at this time and if symptoms were to change or worsen, would return for possible imaging and further workup.  Patient had otherwise no concerns and was discharged in good condition.         Final Clinical Impression(s) / ED Diagnoses Final diagnoses:  Muscle spasm of back  Acute left-sided low back pain without sciatica  Rx / DC Orders ED Discharge Orders          Ordered    cyclobenzaprine  (FLEXERIL) 10 MG tablet  2 times daily PRN        12/23/22 2126    lidocaine (LIDODERM) 5 %  Every 24 hours        12/23/22 2126           Clinical Impression: 1. Muscle spasm of back   2. Acute left-sided low back pain without sciatica     Disposition: Discharge  Condition: Good  I have discussed the results, Dx and Tx plan with the pt(& family if present). He/she/they expressed understanding and agree(s) with the plan. Discharge instructions discussed at great length. Strict return precautions discussed and pt &/or family have verbalized understanding of the instructions. No further questions at time of discharge.    New Prescriptions   CYCLOBENZAPRINE (FLEXERIL) 10 MG TABLET    Take 1 tablet (10 mg total) by mouth 2 (two) times daily as needed for muscle spasms.   LIDOCAINE (LIDODERM) 5 %    Place 1 patch onto the skin daily. Remove & Discard patch within 12 hours or as directed by MD    Follow Up: Donato Schultz, DO [address] [place] [address] [phone]     [facility] Emergency Department at [facility] [address] [place] [address] [phone]        Okechukwu Regnier, Canary Brim, MD 12/23/22 2132

## 2022-12-23 NOTE — ED Triage Notes (Signed)
Pt reports left sided back pain that started on Friday. 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 She reports pain "all over" and unrelieved with Tylenol or Ibuprofen.

## 2022-12-29 ENCOUNTER — Emergency Department (HOSPITAL_BASED_OUTPATIENT_CLINIC_OR_DEPARTMENT_OTHER)
Admission: EM | Admit: 2022-12-29 | Discharge: 2022-12-29 | Disposition: A | Discharge status: Home / Self Care | Payer: Worker's Compensation | Attending: Emergency Medicine | Admitting: Emergency Medicine

## 2022-12-29 ENCOUNTER — Encounter (HOSPITAL_BASED_OUTPATIENT_CLINIC_OR_DEPARTMENT_OTHER): Payer: Self-pay

## 2022-12-29 DIAGNOSIS — J45909 Unspecified asthma, uncomplicated: Secondary | ICD-10-CM | POA: Insufficient documentation

## 2022-12-29 DIAGNOSIS — Z7984 Long term (current) use of oral hypoglycemic drugs: Secondary | ICD-10-CM | POA: Diagnosis not present

## 2022-12-29 DIAGNOSIS — G8911 Acute pain due to trauma: Secondary | ICD-10-CM | POA: Insufficient documentation

## 2022-12-29 DIAGNOSIS — Z794 Long term (current) use of insulin: Secondary | ICD-10-CM | POA: Diagnosis not present

## 2022-12-29 DIAGNOSIS — M546 Pain in thoracic spine: Secondary | ICD-10-CM | POA: Diagnosis not present

## 2022-12-29 DIAGNOSIS — Z7951 Long term (current) use of inhaled steroids: Secondary | ICD-10-CM | POA: Insufficient documentation

## 2022-12-29 DIAGNOSIS — E119 Type 2 diabetes mellitus without complications: Secondary | ICD-10-CM | POA: Insufficient documentation

## 2022-12-29 DIAGNOSIS — M545 Low back pain, unspecified: Secondary | ICD-10-CM | POA: Insufficient documentation

## 2022-12-29 MED ORDER — METHOCARBAMOL 500 MG PO TABS: 1000.0000 mg | ORAL_TABLET | Freq: Four times a day (QID) | ORAL | 0 refills | Status: AC | PRN

## 2022-12-29 MED ORDER — OXYCODONE HCL 5 MG PO TABS: 5.0000 mg | ORAL_TABLET | ORAL | 0 refills | Status: DC | PRN

## 2022-12-29 MED ORDER — METHOCARBAMOL 500 MG PO TABS: 1000.0000 mg | ORAL_TABLET | Freq: Four times a day (QID) | ORAL | 0 refills | Status: DC | PRN

## 2022-12-29 NOTE — ED Triage Notes (Signed)
Pt states she was seen last week for back pain. Pt states that she is still having the pain. Pt's injury was through worker's comp. Pt states they will not cover lidocaine patches. Pt requesting oral meds.

## 2022-12-29 NOTE — Discharge Instructions (Addendum)
For your pain, you may take up to 1000mg  of acetaminophen (tylenol) 4 times daily for up to a week. This is the maximum dose of acetminophen (tylenol) you can take from all sources. Please check other over-the-counter medications and prescriptions to ensure you are not taking other medications that contain acetaminophen.  You may also take ibuprofen 400 mg 6 times a day OR 600mg  4 times a day alternating with or at the same time as tylenol.  You may use this in addition to taking the muscle relaxant 4 times a day and and over the counter lidocaine patch.  Take oxycodone as needed for breakthrough pain.  This medication can be addicting, sedating and cause constipation.

## 2022-12-29 NOTE — ED Provider Notes (Signed)
[place] EMERGENCY DEPARTMENT AT [facility] Provider Note   CSN: [id number] Arrival date & time: 12/29/22  6578     History {Add pertinent medical, surgical, social history, OB history to HPI:1} Chief Complaint  Patient presents with   Medication Refill    Felicia Solis is a 57 y.o. female.  HPI      57 year old female with a history of diabetes, migraines, asthma, thyroid disease, anxiety, depression, chronic back pain, recent emergency department June 30 for worsening of left-sided back pain while she was lifting at work who presents with concern for continued pain.  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  She felt pain in her left back and feels like it is continued.  She the pain is not responded to Motrin or Tylenol.  She denies any numbness or weakness of the legs, denies incontinence of stool or urine.  No other focal trauma or falls.  No history of back surgery.  She was given a prescription for Lidoderm patches and Flexeril  Take ibuprofen, eases it for a little then back. Lidoderm not covered. Has not taken tylenol because doesn' thtink it would work   Home Medications Prior to Admission medications   Medication Sig Start Date End Date Taking? Authorizing Provider  Ascorbic Acid (VITAMIN C) 100 MG tablet Take 100 mg by mouth daily.    [provider]  blood glucose meter kit and supplies KIT Dispense based on patient and insurance preference. Use up to four times daily as directed. (FOR ICD-9 250.00, 250.01). 09/17/17   Zola Button, Grayling Congress, DO  celecoxib (CELEBREX) 100 MG capsule Take 1 capsule (100 mg total) by mouth daily. 07/11/22   Donato Schultz, DO  Cholecalciferol (VITAMIN D3) 2000 units TABS Take 2,000 Units by mouth daily.     [provider]  ciclopirox (PENLAC) 8 % solution Apply topically at bedtime.  Apply over nail and surrounding skin. Apply daily over previous coat. After seven (7) days, may remove with alcohol and continue cycle. 10/24/22   Clayborne Dana, NP  citalopram (CELEXA) 40 MG tablet Take 1 tablet (40 mg total) by mouth daily. 03/26/22   Donato Schultz, DO  cyanocobalamin 500 MCG tablet Take 1,000 mcg by mouth daily.     [provider]  cyclobenzaprine (FLEXERIL) 10 MG tablet Take 1 tablet (10 mg total) by mouth 3 (three) times daily as needed for muscle spasms. 07/11/22   Donato Schultz, DO  cyclobenzaprine (FLEXERIL) 10 MG tablet Take 1 tablet (10 mg total) by mouth 2 (two) times daily as needed for muscle spasms. 12/23/22   Tegeler, Canary Brim, MD  dorzolamide-timolol (COSOPT) 22.3-6.8 MG/ML ophthalmic solution 1 drop 2 (two) times daily. 01/19/22   [provider]  Flaxseed, Linseed, (FLAXSEED OIL PO) Take 1,300 mg by mouth daily.    [provider]  fluticasone (FLONASE) 50 MCG/ACT nasal spray Place 2 sprays into both nostrils daily. 03/26/22   Donato Schultz, DO  glimepiride (AMARYL) 2 MG tablet TAKE 1 TABLET BY MOUTH ONCE DAILY BEFORE BREAKFAST 08/17/22   Zola Button, Grayling Congress, DO  glucose blood (ONETOUCH VERIO) test strip [facility] Check blood sugar twice daily 05/10/20   Seabron Spates R, DO  hydrocortisone 2.5 % cream Apply topically 2 (two) times daily.  09/24/22 09/24/23  Aroura Plump, MD  indapamide (LOZOL) 1.25 MG tablet Take 1 tablet by mouth once daily 04/17/22   Zola Button, Grayling Congress, DO  levothyroxine (SYNTHROID) 88 MCG tablet TAKE 1 TABLET BY MOUTH ONCE DAILY BEFORE BREAKFAST 10/31/22   Zola Button, Yvonne R, DO  lidocaine (LIDODERM) 5 % Place 1 patch onto the skin daily. Remove & Discard patch within 12 hours or as directed by MD 12/23/22   Tegeler, Canary Brim, MD  losartan (COZAAR) 50 MG tablet Take 1 tablet (50 mg total) by mouth daily. 07/11/22   Donato Schultz, DO  meclizine (ANTIVERT) 25 MG tablet Take 1  tablet (25 mg total) by mouth 3 (three) times daily as needed for dizziness. 12/02/20   Drema Dallas, DO  nortriptyline (PAMELOR) 10 MG capsule Take 1 capsule (10 mg total) by mouth at bedtime. 02/27/22   Drema Dallas, DO  Omega-3 Fatty Acids (FISH OIL) 1200 MG CAPS Take 1,200 mg by mouth daily.    [provider]  ondansetron (ZOFRAN) 4 MG tablet Take 1 tablet (4 mg total) by mouth every 8 (eight) hours as needed for nausea or vomiting. 12/02/20   Drema Dallas, DO  OneTouch Delica Lancets 30G MISC 1 Device by Does not apply route 2 (two) times daily as needed. 05/10/20   Donato Schultz, DO  pantoprazole (PROTONIX) 40 MG tablet Take 1 tablet (40 mg total) by mouth daily. 07/11/22   Donato Schultz, DO  rosuvastatin (CRESTOR) 20 MG tablet Take 1 tablet (20 mg total) by mouth daily. 04/30/22   Donato Schultz, DO  Semaglutide, 2 MG/DOSE, 8 MG/3ML SOPN Inject 2 mg as directed once a week. 04/20/22   Seabron Spates R, DO  timolol (TIMOPTIC) 0.5 % ophthalmic solution 1 drop 2 (two) times daily. 11/30/21   [provider]      Allergies    Lisinopril    Review of Systems   Review of Systems  Physical Exam Updated Vital Signs BP (!) 131/90 (BP Location: Right Arm)   Pulse 77   Temp 98.3 F (36.8 C) (Oral)   Resp 17   Ht 5\' 6"  (1.676 m)   Wt 99.8 kg   SpO2 98%   BMI 35.51 kg/m  Physical Exam  ED Results / Procedures / Treatments   Labs (all labs ordered are listed, but only abnormal results are displayed) Labs Reviewed - No data to display  EKG None  Radiology No results found.  Procedures Procedures  {Document cardiac monitor, telemetry assessment procedure when appropriate:1}  Medications Ordered in ED Medications - No data to display  ED Course/ Medical Decision Making/ A&P   {   Click here for ABCD2, HEART and other calculatorsREFRESH Note before signing :1}                          Medical Decision Making  ***  {Document  critical care time when appropriate:1} {Document review of labs and clinical decision tools ie heart score, Chads2Vasc2 etc:1}  {Document your independent review of radiology images, and any outside records:1} {Document your discussion with family members, caretakers, and with consultants:1} {Document social determinants of health affecting pt's care:1} {Document your decision making why or why not admission, treatments were needed:1} Final Clinical Impression(s) / ED Diagnoses Final diagnoses:  None    Rx / DC Orders ED Discharge Orders     None

## 2022-12-29 NOTE — ED Notes (Signed)
Dc instructions reviewed with patient. Patient voiced understanding. Dc with belongings.  °

## 2023-01-04 ENCOUNTER — Other Ambulatory Visit: Payer: Self-pay | Admitting: Family Medicine

## 2023-01-04 DIAGNOSIS — E785 Hyperlipidemia, unspecified: Secondary | ICD-10-CM

## 2023-01-10 ENCOUNTER — Encounter: Payer: Self-pay | Admitting: Family Medicine

## 2023-01-10 ENCOUNTER — Ambulatory Visit (INDEPENDENT_AMBULATORY_CARE_PROVIDER_SITE_OTHER): Payer: Self-pay | Admitting: Family Medicine

## 2023-01-10 VITALS — BP 104/70 | HR 84 | Temp 98.3°F | Resp 18 | Ht 66.0 in | Wt 221.8 lb

## 2023-01-10 DIAGNOSIS — U071 COVID-19: Secondary | ICD-10-CM

## 2023-01-10 MED ORDER — NIRMATRELVIR/RITONAVIR (PAXLOVID)TABLET
3.0000 | ORAL_TABLET | Freq: Two times a day (BID) | ORAL | 0 refills | Status: AC
Start: 2023-01-10 — End: 2023-01-15

## 2023-01-10 MED ORDER — PROMETHAZINE-DM 6.25-15 MG/5ML PO SYRP
5.0000 mL | ORAL_SOLUTION | Freq: Four times a day (QID) | ORAL | 0 refills | Status: DC | PRN
Start: 2023-01-10 — End: 2023-07-16

## 2023-01-10 NOTE — Progress Notes (Signed)
Established Patient Office Visit  Subjective   Patient ID: Felicia Solis, female    DOB: [birth year]  Age: 57 y.o. MRN: [id number]  Chief Complaint  Patient presents with   Covid Positive    Sxs started 01/08/23 and tested positive yesterday    HPI Discussed the use of AI scribe software for clinical note transcription with the patient, who gave verbal consent to proceed.  History of Present Illness   The patient, with a recent exposure to a family member with COVID-19, presents with a worsening cough and fever. The symptoms started a few days ago with a mild cough, which has since worsened. She has also been experiencing night sweats, resulting in wet sheets and gowns. She did not measure her temperature but reported feeling hot. She also experienced a sore throat at the onset of the illness, which has since resolved. The patient has been experiencing body aches, particularly in the stomach area. She has tested positive for COVID-19.      Patient Active Problem List   Diagnosis Date Noted   Bilateral impacted cerumen 03/26/2022   Impacted cerumen of right ear 02/15/2022   Otitis externa 02/15/2022   Other spondylosis with radiculopathy, cervical region 08/01/2021   Neck pain 07/11/2021   Skin tag 12/18/2020   Gastroenteritis 12/06/2020   COVID-19 07/03/2020   TMJ (sprain of temporomandibular joint), initial encounter 07/03/2020   Uncontrolled type 2 diabetes mellitus with hyperglycemia (HCC) 02/25/2020   Trapezius muscle strain, left, initial encounter 08/17/2019   Left wrist pain 08/11/2019   Acute pain of left shoulder 08/11/2019   Acute nonintractable headache 08/11/2019   De Quervain's tenosynovitis, left 08/11/2019   Vertigo 06/09/2019   Right acute serous otitis media 06/09/2019   Impacted cerumen of left ear 06/09/2019   Pelvic pain 02/23/2019   HNP (herniated nucleus pulposus), cervical 01/04/2019   Epigastric mass 10/21/2018   Myalgia 03/18/2018   Hyperlipidemia  associated with type 2 diabetes mellitus (HCC) 03/18/2018   Nausea and vomiting 12/02/2017   Hematuria 12/02/2017   Pansinusitis 11/19/2017   Agoraphobia with panic attacks 11/19/2017   Preventative health care 09/27/2017   Hyperlipidemia LDL goal <100 09/18/2017   Vitamin D deficiency 09/18/2017   Hypothyroidism 09/18/2017   Other intervertebral disc degeneration, lumbar region 04/07/2017   Left hip pain 04/07/2017   Hyperlipidemia LDL goal <70 04/07/2017   Essential hypertension 04/07/2017   Acute vaginitis 04/07/2017   S/P complete thyroidectomy 09/28/2016   Memory loss 08/23/2016   Thyroid nodule 08/02/2016   Plantar fasciitis of right foot 12/01/2015   Knee pain, right 12/01/2015   Headache, migraine 09/06/2015   Bilateral knee pain 08/22/2015   Thoracic myofascial strain 08/22/2015   Dysphagia 04/10/2015   Chest pain 04/10/2015   Epigastric pain 04/10/2015   Functional diarrhea 04/10/2015   NSAID long-term use 04/10/2015   Midepigastric pain 03/22/2015   Cough 01/07/2015   Allergic rhinitis 12/13/2014   Acute bronchitis 12/13/2014   Blood in stool 10/01/2014   DM (diabetes mellitus) type II uncontrolled, periph vascular disorder 08/13/2014   Abnormal ECG 05/14/2012   Breast pain 10/23/2011   ASCUS on Pap smear 10/08/2011   H/O thyroid nodule 08/27/2011   Depression with anxiety 08/27/2011   Heavy periods 08/27/2011   Past Medical History:  Diagnosis Date   Anemia    anemia  when pregnant in 2002   Anxiety    Arthritis    "knees and back" (09/28/2016)   Asthma 5 yrs ago  told she had asthma.. does not remember doctors name.    Chronic lower back pain    Depression    takes citalopram   History of hiatal hernia    History of kidney stones    Hyperlipidemia    Hypothyroidism    LBBB (left bundle branch block)    rate-related; identified on stress test 03/21/16   Migraine    "weekly" (09/28/2016)   Positive TB test 2008   pt states she took 9 months of  medicine for positive tb skin test   Shortness of breath    with exertion   Thyroid disease    Type II diabetes mellitus (HCC) dx'd 2016   Past Surgical History:  Procedure Laterality Date   DILATION AND CURETTAGE OF UTERUS  1999   ENDOMETRIAL ABLATION     EXTRACORPOREAL SHOCK WAVE LITHOTRIPSY Right 04/02/2019   Procedure: EXTRACORPOREAL SHOCK WAVE LITHOTRIPSY (ESWL);  Surgeon: Rene Paci, MD;  Location: [facility];  Service: Urology;  Laterality: Right;   LAPAROSCOPY     years ago ..pt does not know where surgery was done ... states" my stomach would blow up then go down"   THYROIDECTOMY  09/28/2011   Procedure: THYROIDECTOMY;  Surgeon: [name], MD;  Location: [facility] OR;  Service: ENT;  Laterality: Right;  RIGHT THYROIDECTOMY WITH FROZEN SECTION   THYROIDECTOMY  09/28/2016   completion of thyroidectomy/notes 09/28/2016   THYROIDECTOMY Left 09/28/2016   Procedure: COMPLETION OF THYROIDECTOMY;  Surgeon: [name], MD;  Location: [facility] OR;  Service: ENT;  Laterality: Left;   TUBAL LIGATION     Social History   Tobacco Use   Smoking status: Never   Smokeless tobacco: Never  Vaping Use   Vaping status: Never Used  Substance Use Topics   Alcohol use: No    Alcohol/week: 0.0 standard drinks of alcohol   Drug use: No   Social History   Socioeconomic History   Marital status: Married    Spouse name: Not on file   Number of children: 3   Years of education: Not on file   Highest education level: Not on file  Occupational History   Occupation: CNA  Tobacco Use   Smoking status: Never   Smokeless tobacco: Never  Vaping Use   Vaping status: Never Used  Substance and Sexual Activity   Alcohol use: No    Alcohol/week: 0.0 standard drinks of alcohol   Drug use: No   Sexual activity: Not on file  Other Topics Concern   Not on file  Social History Narrative   No exercise--  Walking and lifting pts at work   Right handed   Lives in single story home   Social Determinants  of Health   Financial Resource Strain: Not on file  Food Insecurity: Not on file  Transportation Needs: Not on file  Physical Activity: Not on file  Stress: Not on file  Social Connections: Not on file  Intimate Partner Violence: Not on file   Family Status  Relation Name Status   Mother  Alive   Sister  Alive   Father  Alive   MGM  (Not Specified)   PGF  (Not Specified)   Mat Aunt  (Not Specified)   Other  (Not Specified)   Brother  Alive   Neg Hx  (Not Specified)  No partnership data on file   Family History  Problem Relation Age of Onset   Diabetes Mother    Hypertension Mother  Hyperlipidemia Mother    Gallbladder disease Mother    Heart disease Mother    Diabetes Sister    Hypertension Father    Hyperlipidemia Father    Colon polyps Father    Ovarian cancer Maternal Grandmother    Gallbladder disease Maternal Grandmother    Breast cancer Maternal Grandmother 79   Colon cancer Paternal Grandfather    Breast cancer Maternal Aunt        late 3s   Heart disease Other        early cad   Diabetes Brother    Heart attack Brother    Anesthesia problems Neg Hx    Hypotension Neg Hx    Malignant hyperthermia Neg Hx    Pseudochol deficiency Neg Hx    Esophageal cancer Neg Hx    Allergies  Allergen Reactions   Lisinopril Cough      Review of Systems  Constitutional:  Positive for chills, fever and malaise/fatigue.  HENT:  Negative for congestion.   Eyes:  Negative for blurred vision.  Respiratory:  Positive for cough. Negative for sputum production and shortness of breath.   Cardiovascular:  Negative for chest pain, palpitations and leg swelling.  Gastrointestinal:  Negative for abdominal pain, blood in stool and nausea.  Genitourinary:  Negative for dysuria and frequency.  Musculoskeletal:  Negative for falls.  Skin:  Negative for rash.  Neurological:  Negative for dizziness, loss of consciousness and headaches.  Endo/Heme/Allergies:  Negative for  environmental allergies.  Psychiatric/Behavioral:  Negative for depression. The patient is not nervous/anxious.       Objective:     BP 104/70 (BP Location: Right Arm, Patient Position: Sitting, Cuff Size: Large)   Pulse 84   Temp 98.3 F (36.8 C) (Oral)   Resp 18   Ht 5\' 6"  (1.676 m)   Wt 221 lb 12.8 oz (100.6 kg)   SpO2 98%   BMI 35.80 kg/m  BP Readings from Last 3 Encounters:  01/10/23 104/70  12/29/22 123/80  12/23/22 (!) 118/93   Wt Readings from Last 3 Encounters:  01/10/23 221 lb 12.8 oz (100.6 kg)  12/29/22 220 lb (99.8 kg)  12/23/22 220 lb (99.8 kg)   SpO2 Readings from Last 3 Encounters:  01/10/23 98%  12/29/22 100%  12/23/22 99%      Physical Exam Vitals and nursing note reviewed.  Constitutional:      General: She is not in acute distress.    Appearance: Normal appearance.  HENT:     Head: Normocephalic and atraumatic.     Right Ear: Tympanic membrane, ear canal and external ear normal. There is no impacted cerumen.     Left Ear: Tympanic membrane, ear canal and external ear normal. There is no impacted cerumen.     Nose: Nose normal. No congestion or rhinorrhea.     Right Sinus: No maxillary sinus tenderness or frontal sinus tenderness.     Left Sinus: No maxillary sinus tenderness or frontal sinus tenderness.     Mouth/Throat:     Mouth: Mucous membranes are moist.     Pharynx: Oropharynx is clear. No oropharyngeal exudate or posterior oropharyngeal erythema.  Eyes:     General: No scleral icterus.       Right eye: No discharge.        Left eye: No discharge.     Conjunctiva/sclera: Conjunctivae normal.  Cardiovascular:     Rate and Rhythm: Normal rate and regular rhythm.     Heart sounds: Normal  heart sounds.  Pulmonary:     Effort: Pulmonary effort is normal. No respiratory distress.     Breath sounds: Normal breath sounds.  Musculoskeletal:     Cervical back: Normal range of motion.  Lymphadenopathy:     Cervical: No cervical  adenopathy.  Skin:    General: Skin is warm and dry.  Neurological:     Mental Status: She is alert and oriented to person, place, and time.  Psychiatric:        Mood and Affect: Mood normal.        Behavior: Behavior normal.        Thought Content: Thought content normal.        Judgment: Judgment normal.      No results found for any visits on 01/10/23.  Last CBC Lab Results  Component Value Date   WBC 8.2 10/24/2022   HGB 12.9 10/24/2022   HCT 38.2 10/24/2022   MCV 86.4 10/24/2022   MCH 28.2 08/02/2021   RDW 13.6 10/24/2022   PLT 270.0 10/24/2022   Last metabolic panel Lab Results  Component Value Date   GLUCOSE 83 10/24/2022   NA 142 10/24/2022   K 3.4 (L) 10/24/2022   CL 106 10/24/2022   CO2 29 10/24/2022   BUN 12 10/24/2022   CREATININE 0.52 10/24/2022   GFR 102.69 10/24/2022   CALCIUM 9.0 10/24/2022   PROT 7.1 10/24/2022   ALBUMIN 4.3 10/24/2022   BILITOT 0.6 10/24/2022   ALKPHOS 37 (L) 10/24/2022   AST 25 10/24/2022   ALT 25 10/24/2022   ANIONGAP 10 08/02/2021   Last lipids Lab Results  Component Value Date   CHOL 100 07/09/2022   HDL 36.30 (L) 07/09/2022   LDLCALC 42 07/09/2022   LDLDIRECT 66.0 03/26/2022   TRIG 106.0 07/09/2022   CHOLHDL 3 07/09/2022   Last hemoglobin A1c Lab Results  Component Value Date   HGBA1C 8.8 (H) 03/26/2022   Last thyroid functions Lab Results  Component Value Date   TSH 0.78 10/24/2022   T4TOTAL 8.0 07/28/2019   Last vitamin D Lab Results  Component Value Date   VD25OH 42.39 03/26/2022   Last vitamin B12 and Folate Lab Results  Component Value Date   VITAMINB12 744 10/24/2022      The ASCVD Risk score (Arnett DK, et al., 2019) failed to calculate for the following reasons:   The valid total cholesterol range is 130 to 320 mg/dL    Assessment & Plan:   Problem List Items Addressed This Visit       Unprioritized   COVID-19 - Primary   Relevant Medications   nirmatrelvir/ritonavir (PAXLOVID)  20 x 150 MG & 10 x 100MG  TABS   promethazine-dextromethorphan (PROMETHAZINE-DM) 6.25-15 MG/5ML syrup   Assessment and Plan    COVID-19: Recent positive test with symptoms of cough, fever, and body aches. No productive cough or sore throat. Lung sounds clear on examination. -Start Paxlovid as an antiviral treatment. -Use Mucinex during the day to aid in expectoration. -Use prescribed cough syrup at night for symptom relief. -Take Tylenol for body aches and fever. -Quarantine for 5 days from symptom onset, then wear a mask for an additional 5 days when leaving the house. -Contact the office if symptoms worsen or if there is suspicion of a secondary infection.       No follow-ups on file.    Donato Schultz, DO

## 2023-01-30 ENCOUNTER — Other Ambulatory Visit: Payer: Self-pay | Admitting: Family Medicine

## 2023-02-28 ENCOUNTER — Encounter: Payer: Self-pay | Admitting: Family Medicine

## 2023-02-28 ENCOUNTER — Ambulatory Visit (INDEPENDENT_AMBULATORY_CARE_PROVIDER_SITE_OTHER): Payer: Self-pay | Admitting: Family Medicine

## 2023-02-28 VITALS — BP 122/88 | HR 80 | Temp 98.1°F | Resp 16 | Ht 66.0 in | Wt 222.0 lb

## 2023-02-28 DIAGNOSIS — F418 Other specified anxiety disorders: Secondary | ICD-10-CM

## 2023-02-28 DIAGNOSIS — F4000 Agoraphobia, unspecified: Secondary | ICD-10-CM

## 2023-02-28 MED ORDER — LORAZEPAM 0.5 MG PO TABS
0.5000 mg | ORAL_TABLET | Freq: Two times a day (BID) | ORAL | 1 refills | Status: AC | PRN
Start: 2023-02-28 — End: ?

## 2023-02-28 MED ORDER — DESVENLAFAXINE SUCCINATE ER 50 MG PO TB24
50.0000 mg | ORAL_TABLET | Freq: Every day | ORAL | 3 refills | Status: DC
Start: 2023-02-28 — End: 2024-05-15

## 2023-02-28 NOTE — Progress Notes (Signed)
Established Patient Office Visit  Subjective   Patient ID: Felicia Solis, female    DOB: 1965/09/12  Age: 57 y.o. MRN: [id number]  Chief Complaint  Patient presents with   Anxiety    HPI Discussed the use of AI scribe software for clinical note transcription with the patient, who gave verbal consent to proceed.  History of Present Illness   The patient presents with increased emotional distress, crying, and feeling overwhelmed. She attributes this to multiple stressors, including concern for their father's health, their husband's medical issues, and work stress. She also reports difficulty focusing, constant fatigue, and increased sleep. She admits to eating when not hungry and snapping at family members. She denies suicidal ideation.  The patient has a history of depression and was previously on Celexa and nortriptyline, but she has not been taking any medications for depression or anxiety recently. She does not recall if the Celexa was helpful. She has not previously sought counseling but is open to the idea.      Patient Active Problem List   Diagnosis Date Noted   Bilateral impacted cerumen 03/26/2022   Impacted cerumen of right ear 02/15/2022   Otitis externa 02/15/2022   Other spondylosis with radiculopathy, cervical region 08/01/2021   Neck pain 07/11/2021   Skin tag 12/18/2020   Gastroenteritis 12/06/2020   COVID-19 07/03/2020   TMJ (sprain of temporomandibular joint), initial encounter 07/03/2020   Uncontrolled type 2 diabetes mellitus with hyperglycemia (HCC) 02/25/2020   Trapezius muscle strain, left, initial encounter 08/17/2019   Left wrist pain 08/11/2019   Acute pain of left shoulder 08/11/2019   Acute nonintractable headache 08/11/2019   De Quervain's tenosynovitis, left 08/11/2019   Vertigo 06/09/2019   Right acute serous otitis media 06/09/2019   Impacted cerumen of left ear 06/09/2019   Pelvic pain 02/23/2019   HNP (herniated nucleus pulposus), cervical  01/04/2019   Epigastric mass 10/21/2018   Myalgia 03/18/2018   Hyperlipidemia associated with type 2 diabetes mellitus (HCC) 03/18/2018   Nausea and vomiting 12/02/2017   Hematuria 12/02/2017   Pansinusitis 11/19/2017   Agoraphobia with panic attacks 11/19/2017   Preventative health care 09/27/2017   Hyperlipidemia LDL goal <100 09/18/2017   Vitamin D deficiency 09/18/2017   Hypothyroidism 09/18/2017   Other intervertebral disc degeneration, lumbar region 04/07/2017   Left hip pain 04/07/2017   Hyperlipidemia LDL goal <70 04/07/2017   Essential hypertension 04/07/2017   Acute vaginitis 04/07/2017   S/P complete thyroidectomy 09/28/2016   Memory loss 08/23/2016   Thyroid nodule 08/02/2016   Plantar fasciitis of right foot 12/01/2015   Knee pain, right 12/01/2015   Headache, migraine 09/06/2015   Bilateral knee pain 08/22/2015   Thoracic myofascial strain 08/22/2015   Dysphagia 04/10/2015   Chest pain 04/10/2015   Epigastric pain 04/10/2015   Functional diarrhea 04/10/2015   NSAID long-term use 04/10/2015   Midepigastric pain 03/22/2015   Cough 01/07/2015   Allergic rhinitis 12/13/2014   Acute bronchitis 12/13/2014   Blood in stool 10/01/2014   DM (diabetes mellitus) type II uncontrolled, periph vascular disorder 08/13/2014   Abnormal ECG 05/14/2012   Breast pain 10/23/2011   ASCUS on Pap smear 10/08/2011   H/O thyroid nodule 08/27/2011   Depression with anxiety 08/27/2011   Heavy periods 08/27/2011   Past Medical History:  Diagnosis Date   Anemia    anemia  when pregnant in 2002   Anxiety    Arthritis    "knees and back" (09/28/2016)   Asthma 5  yrs ago    told she had asthma.. does not remember doctors name.    Chronic lower back pain    Depression    takes citalopram   History of hiatal hernia    History of kidney stones    Hyperlipidemia    Hypothyroidism    LBBB (left bundle branch block)    rate-related; identified on stress test 03/21/16   Migraine     "weekly" (09/28/2016)   Positive TB test 2008   pt states she took 9 months of medicine for positive tb skin test   Shortness of breath    with exertion   Thyroid disease    Type II diabetes mellitus (HCC) dx'd 2016   Past Surgical History:  Procedure Laterality Date   DILATION AND CURETTAGE OF UTERUS  1999   ENDOMETRIAL ABLATION     EXTRACORPOREAL SHOCK WAVE LITHOTRIPSY Right 04/02/2019   Procedure: EXTRACORPOREAL SHOCK WAVE LITHOTRIPSY (ESWL);  Surgeon: Rene Paci, MD;  Location: [facility];  Service: Urology;  Laterality: Right;   LAPAROSCOPY     years ago ..pt does not know where surgery was done ... states" my stomach would blow up then go down"   THYROIDECTOMY  09/28/2011   Procedure: THYROIDECTOMY;  Surgeon: [name], MD;  Location: [facility] OR;  Service: ENT;  Laterality: Right;  RIGHT THYROIDECTOMY WITH FROZEN SECTION   THYROIDECTOMY  09/28/2016   completion of thyroidectomy/notes 09/28/2016   THYROIDECTOMY Left 09/28/2016   Procedure: COMPLETION OF THYROIDECTOMY;  Surgeon: [name], MD;  Location: [facility] OR;  Service: ENT;  Laterality: Left;   TUBAL LIGATION     Social History   Tobacco Use   Smoking status: Never   Smokeless tobacco: Never  Vaping Use   Vaping status: Never Used  Substance Use Topics   Alcohol use: No    Alcohol/week: 0.0 standard drinks of alcohol   Drug use: No   Social History   Socioeconomic History   Marital status: Married    Spouse name: Not on file   Number of children: 3   Years of education: Not on file   Highest education level: Not on file  Occupational History   Occupation: CNA  Tobacco Use   Smoking status: Never   Smokeless tobacco: Never  Vaping Use   Vaping status: Never Used  Substance and Sexual Activity   Alcohol use: No    Alcohol/week: 0.0 standard drinks of alcohol   Drug use: No   Sexual activity: Not on file  Other Topics Concern   Not on file  Social History Narrative   No exercise--  Walking and lifting  pts at work   Right handed   Lives in single story home   Social Determinants of Health   Financial Resource Strain: Not on file  Food Insecurity: Not on file  Transportation Needs: Not on file  Physical Activity: Not on file  Stress: Not on file  Social Connections: Not on file  Intimate Partner Violence: Not on file   Family Status  Relation Name Status   Mother  Alive   Sister  Alive   Father  Alive   MGM  (Not Specified)   PGF  (Not Specified)   Mat Aunt  (Not Specified)   Other  (Not Specified)   Brother  Alive   Neg Hx  (Not Specified)  No partnership data on file   Family History  Problem Relation Age of Onset   Diabetes Mother  Hypertension Mother    Hyperlipidemia Mother    Gallbladder disease Mother    Heart disease Mother    Diabetes Sister    Hypertension Father    Hyperlipidemia Father    Colon polyps Father    Ovarian cancer Maternal Grandmother    Gallbladder disease Maternal Grandmother    Breast cancer Maternal Grandmother 58   Colon cancer Paternal Grandfather    Breast cancer Maternal Aunt        late 26s   Heart disease Other        early cad   Diabetes Brother    Heart attack Brother    Anesthesia problems Neg Hx    Hypotension Neg Hx    Malignant hyperthermia Neg Hx    Pseudochol deficiency Neg Hx    Esophageal cancer Neg Hx    Allergies  Allergen Reactions   Lisinopril Cough      Review of Systems  Constitutional:  Negative for fever and malaise/fatigue.  HENT:  Negative for congestion.   Eyes:  Negative for blurred vision.  Respiratory:  Negative for shortness of breath.   Cardiovascular:  Negative for chest pain, palpitations and leg swelling.  Gastrointestinal:  Negative for abdominal pain, blood in stool and nausea.  Genitourinary:  Negative for dysuria and frequency.  Musculoskeletal:  Negative for falls.  Skin:  Negative for rash.  Neurological:  Negative for dizziness, loss of consciousness and headaches.   Endo/Heme/Allergies:  Negative for environmental allergies.  Psychiatric/Behavioral:  Positive for depression. Negative for hallucinations, substance abuse and suicidal ideas. The patient is nervous/anxious.       Objective:     BP 122/88 (BP Location: Left Arm, Patient Position: Sitting, Cuff Size: Large)   Pulse 80   Temp 98.1 F (36.7 C) (Oral)   Resp 16   Ht 5\' 6"  (1.676 m)   Wt 222 lb (100.7 kg)   SpO2 96%   BMI 35.83 kg/m     Physical Exam Vitals and nursing note reviewed.  Constitutional:      General: She is not in acute distress.    Appearance: Normal appearance. She is well-developed.  HENT:     Head: Normocephalic and atraumatic.  Eyes:     General: No scleral icterus.       Right eye: No discharge.        Left eye: No discharge.  Cardiovascular:     Rate and Rhythm: Normal rate and regular rhythm.     Heart sounds: No murmur heard. Pulmonary:     Effort: Pulmonary effort is normal. No respiratory distress.     Breath sounds: Normal breath sounds.  Musculoskeletal:        General: Normal range of motion.     Cervical back: Normal range of motion and neck supple.     Right lower leg: No edema.     Left lower leg: No edema.  Skin:    General: Skin is warm and dry.  Neurological:     General: No focal deficit present.     Mental Status: She is alert and oriented to person, place, and time.  Psychiatric:        Mood and Affect: Mood is anxious and depressed. Affect is tearful.        Speech: Speech normal.        Behavior: Behavior normal. Behavior is cooperative.        Thought Content: Thought content normal. Thought content is not delusional. Thought content does  not include homicidal or suicidal ideation. Thought content does not include homicidal or suicidal plan.        Cognition and Memory: Cognition and memory normal.        Judgment: Judgment normal.      No results found for any visits on 02/28/23.    The ASCVD Risk score (Arnett DK, et  al., 2019) failed to calculate for the following reasons:   The valid total cholesterol range is 130 to 320 mg/dL    Assessment & Plan:  Assessment and Plan    Depression and Anxiety Patient reports feeling down, difficulty focusing, and increased irritability. She has been off her previous antidepressant medications (Celexa and Nortriptyline) for an unknown duration. She denies suicidal ideation. -Start Pristiq once daily for depression and anxiety. -Provide Lorazepam for use as needed for acute agitation. -Refer to counseling services for additional support. -Check thyroid function to rule out any physiological cause for her symptoms.  Sleep Disturbance Patient reports fragmented sleep pattern which may be contributing to her mood symptoms. -Advise on sleep hygiene and consider using Lorazepam to aid sleep if needed.  Follow-up Monitor response to new medication and effectiveness of counseling.       Problem List Items Addressed This Visit       Unprioritized   Depression with anxiety - Primary    Pt is not suicidal Start medicaion Refer for counseling       Relevant Medications   desvenlafaxine (PRISTIQ) 50 MG 24 hr tablet   LORazepam (ATIVAN) 0.5 MG tablet   Other Relevant Orders   Ambulatory referral to Psychology   Other Visit Diagnoses     Agoraphobia       Relevant Medications   desvenlafaxine (PRISTIQ) 50 MG 24 hr tablet   LORazepam (ATIVAN) 0.5 MG tablet       No follow-ups on file.    Donato Schultz, DO

## 2023-02-28 NOTE — Assessment & Plan Note (Signed)
Pt is not suicidal [employer] park manager for counseling

## 2023-02-28 NOTE — Patient Instructions (Signed)
Managing Depression, Adult Depression is a mental health condition that affects your thoughts, feelings, and actions. Being diagnosed with depression can bring you relief if you did not know why you have felt or behaved a certain way. It could also leave you feeling overwhelmed. Finding ways to manage your symptoms can help you feel more positive about your future. How to manage lifestyle changes Being depressed is difficult. Depression can increase the level of everyday stress. Stress can make depression symptoms worse. You may believe your symptoms cannot be managed or will never improve. However, there are many things you can try to help manage your symptoms. There is hope. Managing stress  Stress is your body's reaction to life changes and events, both good and bad. Stress can add to your feelings of depression. Learning to manage your stress can help lessen your feelings of depression. Try some of the following approaches to reducing your stress (stress reduction techniques): Listen to music that you enjoy and that inspires you. Try using a meditation app or take a meditation class. Develop a practice that helps you connect with your spiritual self. Walk in nature, pray, or go to a place of worship. Practice deep breathing. To do this, inhale slowly through your nose. Pause at the top of your inhale for a few seconds and then exhale slowly, letting yourself relax. Repeat this three or four times. Practice yoga to help relax and work your muscles. Choose a stress reduction technique that works for you. These techniques take time and practice to develop. Set aside 5-15 minutes a day to do them. Therapists can offer training in these techniques. Do these things to help manage stress: Keep a journal. Know your limits. Set healthy boundaries for yourself and others, such as saying "no" when you think something is too much. Pay attention to how you react to certain situations. You may not be able to  control everything, but you can change your reaction. Add humor to your life by watching funny movies or shows. Make time for activities that you enjoy and that relax you. Spend less time using electronics, especially at night before bed. The light from screens can make your brain think it is time to get up rather than go to bed.  Medicines Medicines, such as antidepressants, are often a part of treatment for depression. Talk with your pharmacist or health care provider about all the medicines, supplements, and herbal products that you take, their possible side effects, and what medicines and other products are safe to take together. Make sure to report any side effects you may have to your health care provider. Relationships Your health care provider may suggest family therapy, couples therapy, or individual therapy as part of your treatment. How to recognize changes Everyone responds differently to treatment for depression. As you recover from depression, you may start to: Have more interest in doing activities. Feel more hopeful. Have more energy. Eat a more regular amount of food. Have better mental focus. It is important to recognize if your depression is not getting better or is getting worse. The symptoms you had in the beginning may return, such as: Feeling tired. Eating too much or too little. Sleeping too much or too little. Feeling restless, agitated, or hopeless. Trouble focusing or making decisions. Having unexplained aches and pains. Feeling irritable, angry, or aggressive. If you or your family members notice these symptoms coming back, let your health care provider know right away. Follow these instructions at home: Activity Try to   get some form of exercise each day, such as walking. Try yoga, mindfulness, or other stress reduction techniques. Participate in group activities if you are able. Lifestyle Get enough sleep. Cut down on or stop using caffeine, tobacco,  alcohol, and any other harmful substances. Eat a healthy diet that includes plenty of vegetables, fruits, whole grains, low-fat dairy products, and lean protein. Limit foods that are high in solid fats, added sugar, or salt (sodium). General instructions Take over-the-counter and prescription medicines only as told by your health care provider. Keep all follow-up visits. It is important for your health care provider to check on your mood, behavior, and medicines. Your health care provider may need to make changes to your treatment. Where to find support Talking to others  Friends and family members can be sources of support and guidance. Talk to trusted friends or family members about your condition. Explain your symptoms and let them know that you are working with a health care provider to treat your depression. Tell friends and family how they can help. Finances Find mental health providers that fit with your financial situation. Talk with your health care provider if you are worried about access to food, housing, or medicine. Call your insurance company to learn about your co-pays and prescription plan. Where to find more information You can find support in your area from: Anxiety and Depression Association of America (ADAA): adaa.org Mental Health America: mentalhealthamerica.net National Alliance on Mental Illness: nami.org Contact a health care provider if: You stop taking your antidepressant medicines, and you have any of these symptoms: Nausea. Headache. Light-headedness. Chills and body aches. Not being able to sleep (insomnia). You or your friends and family think your depression is getting worse. Get help right away if: You have thoughts of hurting yourself or others. Get help right away if you feel like you may hurt yourself or others, or have thoughts about taking your own life. Go to your nearest emergency room or: Call 911. Call the National Suicide Prevention Lifeline at  1-800-273-8255 or 988. This is open 24 hours a day. Text the Crisis Text Line at 741741. This information is not intended to replace advice given to you by your health care provider. Make sure you discuss any questions you have with your health care provider. Document Revised: 10/17/2021 Document Reviewed: 10/17/2021 Elsevier Patient Education  2024 Elsevier Inc.  

## 2023-03-18 ENCOUNTER — Ambulatory Visit
Admission: RE | Admit: 2023-03-18 | Discharge: 2023-03-18 | Disposition: A | Discharge status: Home / Self Care | Payer: No Typology Code available for payment source | Source: Ambulatory Visit | Attending: Family Medicine | Admitting: Family Medicine

## 2023-03-18 ENCOUNTER — Ambulatory Visit: Payer: No Typology Code available for payment source

## 2023-03-18 DIAGNOSIS — Z1231 Encounter for screening mammogram for malignant neoplasm of breast: Secondary | ICD-10-CM

## 2023-03-28 ENCOUNTER — Encounter: Payer: Self-pay | Admitting: Family Medicine

## 2023-04-08 LAB — HM DIABETES EYE EXAM

## 2023-05-01 ENCOUNTER — Other Ambulatory Visit: Payer: Self-pay | Admitting: Family Medicine

## 2023-05-18 ENCOUNTER — Other Ambulatory Visit: Payer: Self-pay | Admitting: Family Medicine

## 2023-05-18 DIAGNOSIS — E785 Hyperlipidemia, unspecified: Secondary | ICD-10-CM

## 2023-06-10 ENCOUNTER — Telehealth: Payer: Self-pay | Admitting: Family Medicine

## 2023-06-10 NOTE — Telephone Encounter (Signed)
Patient called and asked for you specifically to give her a call to discuss her Ozempic medication. She stated that you typically assist her with it. Please call and advise.

## 2023-06-11 NOTE — Telephone Encounter (Signed)
Spoke with [employer] and she needs renew.  Patient will be by today to finish up paperwork.  I have it on my desk.

## 2023-07-02 ENCOUNTER — Ambulatory Visit (INDEPENDENT_AMBULATORY_CARE_PROVIDER_SITE_OTHER): Payer: Self-pay | Admitting: Family Medicine

## 2023-07-02 ENCOUNTER — Encounter: Payer: Self-pay | Admitting: Family Medicine

## 2023-07-02 VITALS — BP 120/88 | HR 60 | Temp 97.8°F | Resp 18 | Ht 66.0 in | Wt 223.2 lb

## 2023-07-02 DIAGNOSIS — R350 Frequency of micturition: Secondary | ICD-10-CM

## 2023-07-02 DIAGNOSIS — L84 Corns and callosities: Secondary | ICD-10-CM

## 2023-07-02 DIAGNOSIS — E1165 Type 2 diabetes mellitus with hyperglycemia: Secondary | ICD-10-CM

## 2023-07-02 LAB — POC URINALSYSI DIPSTICK (AUTOMATED)
Bilirubin, UA: NEGATIVE
Blood, UA: NEGATIVE
Glucose, UA: NEGATIVE
Ketones, UA: NEGATIVE
Nitrite, UA: NEGATIVE
Protein, UA: NEGATIVE
Spec Grav, UA: 1.02 (ref 1.010–1.025)
Urobilinogen, UA: 0.2 U/dL
pH, UA: 6 (ref 5.0–8.0)

## 2023-07-02 MED ORDER — CEPHALEXIN 500 MG PO CAPS
500.0000 mg | ORAL_CAPSULE | Freq: Two times a day (BID) | ORAL | 0 refills | Status: DC
Start: 2023-07-02 — End: 2023-07-19

## 2023-07-02 NOTE — Progress Notes (Signed)
 Established Patient Office Visit  Subjective   Patient ID: Felicia Solis, female    DOB: 1966/06/08  Age: 58 y.o. MRN: [id number]  Chief Complaint  Patient presents with   Urinary Frequency    X1 week, pt states having freq, no burning, some right abdominal pain, lower back pain   Toe Pain    Left foot, pt states occ pain.     HPI Discussed the use of AI scribe software for clinical note transcription with the patient, who gave verbal consent to proceed.  History of Present Illness   The patient, with a history of diabetes and recent urinary complaints, presents with increased urinary frequency and discomfort in the lower abdomen and back for the past week. She denies dysuria but reports urgency and incontinence, stating, when I feel I got to go, I better start going. She has been managing the discomfort with over-the-counter Tylenol .  Additionally, the patient reports a new callus on her foot, which she noticed last week while applying Vaseline. She describes the callus as sore and has been receiving pedicures regularly.  Lastly, the patient mentions an episode of hematuria that occurred in a public restroom. She noticed blood in the toilet after urination but did not observe any blood on the tissue. She has not seen any blood in her urine since this incident.      Patient Active Problem List   Diagnosis Date Noted   Bilateral impacted cerumen 03/26/2022   Impacted cerumen of right ear 02/15/2022   Otitis externa 02/15/2022   Other spondylosis with radiculopathy, cervical region 08/01/2021   Neck pain 07/11/2021   Skin tag 12/18/2020   Gastroenteritis 12/06/2020   COVID-19 07/03/2020   TMJ (sprain of temporomandibular joint), initial encounter 07/03/2020   Uncontrolled type 2 diabetes mellitus with hyperglycemia (HCC) 02/25/2020   Trapezius muscle strain, left, initial encounter 08/17/2019   Left wrist pain 08/11/2019   Acute pain of left shoulder 08/11/2019   Acute  nonintractable headache 08/11/2019   De Quervain's tenosynovitis, left 08/11/2019   Vertigo 06/09/2019   Right acute serous otitis media 06/09/2019   Impacted cerumen of left ear 06/09/2019   Pelvic pain 02/23/2019   HNP (herniated nucleus pulposus), cervical 01/04/2019   Epigastric mass 10/21/2018   Myalgia 03/18/2018   Hyperlipidemia associated with type 2 diabetes mellitus (HCC) 03/18/2018   Nausea and vomiting 12/02/2017   Hematuria 12/02/2017   Pansinusitis 11/19/2017   Agoraphobia with panic attacks 11/19/2017   Preventative health care 09/27/2017   Hyperlipidemia LDL goal <100 09/18/2017   Vitamin D  deficiency 09/18/2017   Hypothyroidism 09/18/2017   Other intervertebral disc degeneration, lumbar region 04/07/2017   Left hip pain 04/07/2017   Hyperlipidemia LDL goal <70 04/07/2017   Essential hypertension 04/07/2017   Acute vaginitis 04/07/2017   S/P complete thyroidectomy 09/28/2016   Memory loss 08/23/2016   Thyroid  nodule 08/02/2016   Plantar fasciitis of right foot 12/01/2015   Knee pain, right 12/01/2015   Headache, migraine 09/06/2015   Bilateral knee pain 08/22/2015   Thoracic myofascial strain 08/22/2015   Dysphagia 04/10/2015   Chest pain 04/10/2015   Epigastric pain 04/10/2015   Functional diarrhea 04/10/2015   NSAID long-term use 04/10/2015   Midepigastric pain 03/22/2015   Cough 01/07/2015   Allergic rhinitis 12/13/2014   Acute bronchitis 12/13/2014   Blood in stool 10/01/2014   DM (diabetes mellitus) type II uncontrolled, periph vascular disorder 08/13/2014   Abnormal ECG 05/14/2012   Breast pain 10/23/2011  ASCUS on Pap smear 10/08/2011   H/O thyroid  nodule 08/27/2011   Depression with anxiety 08/27/2011   Heavy periods 08/27/2011   Past Medical History:  Diagnosis Date   Anemia    anemia  when pregnant in 2002   Anxiety    Arthritis    knees and back (09/28/2016)   Asthma 5 yrs ago    told she had asthma.. does not remember doctors name.     Chronic lower back pain    Depression    takes citalopram    History of hiatal hernia    History of kidney stones    Hyperlipidemia    Hypothyroidism    LBBB (left bundle branch block)    rate-related; identified on stress test 03/21/16   Migraine    weekly (09/28/2016)   Positive TB test 2008   pt states she took 9 months of medicine for positive tb skin test   Shortness of breath    with exertion   Thyroid  disease    Type II diabetes mellitus (HCC) dx'd 2016   Past Surgical History:  Procedure Laterality Date   DILATION AND CURETTAGE OF UTERUS  1999   ENDOMETRIAL ABLATION     EXTRACORPOREAL SHOCK WAVE LITHOTRIPSY Right 04/02/2019   Procedure: EXTRACORPOREAL SHOCK WAVE LITHOTRIPSY (ESWL);  Surgeon: Devere Lonni Righter, MD;  Location: [facility];  Service: Urology;  Laterality: Right;   LAPAROSCOPY     years ago ..pt does not know where surgery was done ... states my stomach would blow up then go down   THYROIDECTOMY  09/28/2011   Procedure: THYROIDECTOMY;  Surgeon: Ida Loader, MD;  Location: [facility] OR;  Service: ENT;  Laterality: Right;  RIGHT THYROIDECTOMY WITH FROZEN SECTION   THYROIDECTOMY  09/28/2016   completion of thyroidectomy/notes 09/28/2016   THYROIDECTOMY Left 09/28/2016   Procedure: COMPLETION OF THYROIDECTOMY;  Surgeon: Ida Loader, MD;  Location: [facility] OR;  Service: ENT;  Laterality: Left;   TUBAL LIGATION     Social History   Tobacco Use   Smoking status: Never   Smokeless tobacco: Never  Vaping Use   Vaping status: Never Used  Substance Use Topics   Alcohol use: No    Alcohol/week: 0.0 standard drinks of alcohol   Drug use: No   Social History   Socioeconomic History   Marital status: Married    Spouse name: Not on file   Number of children: 3   Years of education: Not on file   Highest education level: Not on file  Occupational History   Occupation: CNA  Tobacco Use   Smoking status: Never   Smokeless tobacco: Never  Vaping Use   Vaping status:  Never Used  Substance and Sexual Activity   Alcohol use: No    Alcohol/week: 0.0 standard drinks of alcohol   Drug use: No   Sexual activity: Not on file  Other Topics Concern   Not on file  Social History Narrative   No exercise--  Walking and lifting pts at work   Right handed   Lives in single story home   Social Drivers of Health   Financial Resource Strain: Not on file  Food Insecurity: Not on file  Transportation Needs: Not on file  Physical Activity: Not on file  Stress: Not on file  Social Connections: Not on file  Intimate Partner Violence: Not on file   Family Status  Relation Name Status   Mother  Alive   Sister  Alive   Father  Alive  MGM  (Not Specified)   PGF  (Not Specified)   Mat Aunt  (Not Specified)   Other  (Not Specified)   Brother  Alive   Neg Hx  (Not Specified)  No partnership data on file   Family History  Problem Relation Age of Onset   Diabetes Mother    Hypertension Mother    Hyperlipidemia Mother    Gallbladder disease Mother    Heart disease Mother    Diabetes Sister    Hypertension Father    Hyperlipidemia Father    Colon polyps Father    Ovarian cancer Maternal Grandmother    Gallbladder disease Maternal Grandmother    Breast cancer Maternal Grandmother 60   Colon cancer Paternal Grandfather    Breast cancer Maternal Aunt        late 75s   Heart disease Other        early cad   Diabetes Brother    Heart attack Brother    Anesthesia problems Neg Hx    Hypotension Neg Hx    Malignant hyperthermia Neg Hx    Pseudochol deficiency Neg Hx    Esophageal cancer Neg Hx    Allergies  Allergen Reactions   Lisinopril  Cough      Review of Systems  Constitutional:  Negative for chills, fever and malaise/fatigue.  HENT:  Negative for congestion and hearing loss.   Eyes:  Negative for discharge.  Respiratory:  Negative for cough, sputum production and shortness of breath.   Cardiovascular:  Negative for chest pain, palpitations  and leg swelling.  Gastrointestinal:  Negative for abdominal pain, blood in stool, constipation, diarrhea, heartburn, nausea and vomiting.  Genitourinary:  Negative for dysuria, frequency, hematuria and urgency.  Musculoskeletal:  Negative for back pain, falls and myalgias.  Skin:  Negative for rash.  Neurological:  Negative for dizziness, sensory change, loss of consciousness, weakness and headaches.  Endo/Heme/Allergies:  Negative for environmental allergies. Does not bruise/bleed easily.  Psychiatric/Behavioral:  Negative for depression and suicidal ideas. The patient is not nervous/anxious and does not have insomnia.       Objective:     BP 120/88 (BP Location: Left Arm, Patient Position: Sitting, Cuff Size: Large)   Pulse 60   Temp 97.8 F (36.6 C) (Oral)   Resp 18   Ht 5' 6 (1.676 m)   Wt 223 lb 3.2 oz (101.2 kg)   SpO2 98%   BMI 36.03 kg/m  BP Readings from Last 3 Encounters:  07/02/23 120/88  02/28/23 122/88  01/10/23 104/70   Wt Readings from Last 3 Encounters:  07/02/23 223 lb 3.2 oz (101.2 kg)  02/28/23 222 lb (100.7 kg)  01/10/23 221 lb 12.8 oz (100.6 kg)   SpO2 Readings from Last 3 Encounters:  07/02/23 98%  02/28/23 96%  01/10/23 98%    Physical Exam Vitals and nursing note reviewed.  Constitutional:      General: She is not in acute distress.    Appearance: Normal appearance. She is well-developed.  HENT:     Head: Normocephalic and atraumatic.  Eyes:     General: No scleral icterus.       Right eye: No discharge.        Left eye: No discharge.  Cardiovascular:     Rate and Rhythm: Normal rate and regular rhythm.     Heart sounds: No murmur heard. Pulmonary:     Effort: Pulmonary effort is normal. No respiratory distress.     Breath sounds: Normal breath  sounds.  Abdominal:     Comments: Min suprapubic tenderness  Flank pain no tenderness with palpation   Musculoskeletal:        General: Normal range of motion.     Cervical back: Normal  range of motion and neck supple.     Right lower leg: No edema.     Left lower leg: No edema.  Skin:    General: Skin is warm and dry.          Comments: Callous L foot 4th toe hard and tender   Neurological:     Mental Status: She is alert and oriented to person, place, and time.  Psychiatric:        Mood and Affect: Mood normal.        Behavior: Behavior normal.        Thought Content: Thought content normal.        Judgment: Judgment normal.      Results for orders placed or performed in visit on 07/02/23  POCT Urinalysis Dipstick (Automated)  Result Value Ref Range   Color, UA yellow    Clarity, UA clear    Glucose, UA Negative Negative   Bilirubin, UA neg    Ketones, UA neg    Spec Grav, UA 1.020 1.010 - 1.025   Blood, UA negative    pH, UA 6.0 5.0 - 8.0   Protein, UA Negative Negative   Urobilinogen, UA 0.2 0.2 or 1.0 E.U./dL   Nitrite, UA neg    Leukocytes, UA Trace (A) Negative    Last CBC Lab Results  Component Value Date   WBC 8.2 10/24/2022   HGB 12.9 10/24/2022   HCT 38.2 10/24/2022   MCV 86.4 10/24/2022   MCH 28.2 08/02/2021   RDW 13.6 10/24/2022   PLT 270.0 10/24/2022   Last metabolic panel Lab Results  Component Value Date   GLUCOSE 83 10/24/2022   NA 142 10/24/2022   K 3.4 (L) 10/24/2022   CL 106 10/24/2022   CO2 29 10/24/2022   BUN 12 10/24/2022   CREATININE 0.52 10/24/2022   GFR 102.69 10/24/2022   CALCIUM  9.0 10/24/2022   PROT 7.1 10/24/2022   ALBUMIN 4.3 10/24/2022   BILITOT 0.6 10/24/2022   ALKPHOS 37 (L) 10/24/2022   AST 25 10/24/2022   ALT 25 10/24/2022   ANIONGAP 10 08/02/2021   Last lipids Lab Results  Component Value Date   CHOL 100 07/09/2022   HDL 36.30 (L) 07/09/2022   LDLCALC 42 07/09/2022   LDLDIRECT 66.0 03/26/2022   TRIG 106.0 07/09/2022   CHOLHDL 3 07/09/2022   Last hemoglobin A1c Lab Results  Component Value Date   HGBA1C 8.8 (H) 03/26/2022   Last thyroid  functions Lab Results  Component Value Date    TSH 0.78 10/24/2022   T4TOTAL 8.0 07/28/2019   Last vitamin D  Lab Results  Component Value Date   VD25OH 42.39 03/26/2022   Last vitamin B12 and Folate Lab Results  Component Value Date   VITAMINB12 744 10/24/2022      The ASCVD Risk score (Arnett DK, et al., 2019) failed to calculate for the following reasons:   The valid total cholesterol range is 130 to 320 mg/dL    Assessment & Plan:   Problem List Items Addressed This Visit   None Visit Diagnoses       Urinary frequency    -  Primary   Relevant Medications   cephALEXin  (KEFLEX ) 500 MG capsule   Other Relevant Orders  POCT Urinalysis Dipstick (Automated) (Completed)   Urine Culture     Type 2 diabetes mellitus with hyperglycemia, without long-term current use of insulin (HCC)       Relevant Orders   Ambulatory referral to Podiatry     Pre-ulcerative corn or callous       Relevant Orders   Ambulatory referral to Podiatry     Assessment and Plan    Urinary Tract Infection (UTI) Increased urinary frequency and back pain suggest a UTI, supported by urinalysis showing leukocytes. Differential diagnosis includes kidney stones or cystitis. Discussed the risks of an untreated UTI, such as kidney infection, and the benefits of antibiotics for symptom relief and infection resolution. We will prescribe Keflex  for 5 days, send urine for culture, and recheck urine in two weeks. She should contact the office if symptoms worsen and await culture results if symptoms improve.  Callus on Foot She has a sore callus on her foot, noticed last week, despite receiving regular pedicures. We advised on proper foot care, crucial due to her diabetic status, and discussed callus removal options, including pedicurist intervention or a podiatrist referral. We will refer her to a podiatrist, advise her pedicurist to address the callus, and educate on proper foot care, emphasizing avoiding lotion between toes.  Diabetes Mellitus Diabetes  management was discussed, emphasizing the importance of proper foot care and avoiding lotion between toes to prevent skin breakdown. We will continue her current diabetes management and educate on proper foot care.  General Health Maintenance For general health maintenance, she should follow up with Dr. Armond for gynecological concerns and consider a referral to a urogynecologist if urinary symptoms persist.  Follow-up For follow-up, she should recheck urine in two weeks, contact the office if symptoms worsen or do not improve with antibiotics, follow up with the podiatrist for foot care, and follow up with Dr. Armond for gynecological concerns.        Return if symptoms worsen or fail to improve.    Daron Breeding R Lowne Chase, DO

## 2023-07-02 NOTE — Patient Instructions (Signed)
`  eUrinary Frequency, Adult Urinary frequency means urinating more often than usual. You may urinate every 1-2 hours even though you drink a normal amount of fluid and do not have a bladder infection or condition. Although you urinate more often than normal, the total amount of urine produced in a day is normal. With urinary frequency, you may have an urgent need to urinate often. The stress and anxiety of needing to find a bathroom quickly can make this urge worse. This condition may go away on its own, or you may need treatment at home. Home treatment may include bladder training, exercises, taking medicines, or making changes to your diet. Follow these instructions at home: Bladder health Your health care provider will tell you what to do to improve bladder health. You may be told to: Keep a bladder diary. Keep track of: What you eat and drink. How often you urinate. How much you urinate. Follow a bladder training program. This may include: Learning to delay going to the bathroom. Double urinating, also called voiding. This helps if you are not completely emptying your bladder. Scheduled voiding. Do Kegel exercises. Kegel exercises strengthen the muscles that help control urination, which may help the condition.  Eating and drinking Follow instructions from your health care provider about eating or drinking restrictions. You may be told to: Avoid caffeine. Drink fewer fluids, especially alcohol. Avoid drinking in the evening. Avoid foods or drinks that may irritate the bladder. These include coffee, tea, soda, artificial sweeteners, citrus, tomato-based foods, and chocolate. Eat foods that help prevent or treat constipation. Constipation can make urinary frequency worse. You may need to take these actions to prevent or treat constipation: Drink enough fluid to keep your urine pale yellow. Take over-the-counter or prescription medicines. Eat foods that are high in fiber, such as beans,  whole grains, and fresh fruits and vegetables. Limit foods that are high in fat and processed sugars, such as fried or sweet foods. General instructions Take over-the-counter and prescription medicines only as told by your health care provider. Keep all follow-up visits. This is important. Contact a health care provider if: You start urinating more often. You feel pain or irritation when you urinate. You notice blood in your urine. Your urine looks cloudy. You develop a fever. You begin vomiting. Get help right away if: You are unable to urinate. Summary Urinary frequency means urinating more often than usual. With urinary frequency, you may urinate every 1-2 hours even though you drink a normal amount of fluid and do not have a bladder infection or other bladder condition. Your health care provider may recommend that you keep a bladder diary, follow a bladder training program, or make dietary changes. If told by your health care provider, do Kegel exercises to strengthen the muscles that help control urination. Take over-the-counter and prescription medicines only as told by your health care provider. Contact a health care provider if your symptoms do not improve or get worse. This information is not intended to replace advice given to you by your health care provider. Make sure you discuss any questions you have with your health care provider. Document Revised: 01/15/2020 Document Reviewed: 01/15/2020 Elsevier Patient Education  2024 [employer].

## 2023-07-03 LAB — URINE CULTURE
MICRO NUMBER:: 15927185
Result:: NO GROWTH
SPECIMEN QUALITY:: ADEQUATE

## 2023-07-16 ENCOUNTER — Ambulatory Visit (INDEPENDENT_AMBULATORY_CARE_PROVIDER_SITE_OTHER): Payer: Self-pay | Admitting: Family Medicine

## 2023-07-16 ENCOUNTER — Encounter: Payer: Self-pay | Admitting: Family Medicine

## 2023-07-16 VITALS — BP 132/88 | HR 82 | Temp 98.4°F | Resp 18 | Ht 66.0 in | Wt 226.8 lb

## 2023-07-16 DIAGNOSIS — R350 Frequency of micturition: Secondary | ICD-10-CM

## 2023-07-16 DIAGNOSIS — E1165 Type 2 diabetes mellitus with hyperglycemia: Secondary | ICD-10-CM

## 2023-07-16 DIAGNOSIS — E1169 Type 2 diabetes mellitus with other specified complication: Secondary | ICD-10-CM

## 2023-07-16 DIAGNOSIS — R1031 Right lower quadrant pain: Secondary | ICD-10-CM

## 2023-07-16 DIAGNOSIS — I10 Essential (primary) hypertension: Secondary | ICD-10-CM

## 2023-07-16 DIAGNOSIS — E785 Hyperlipidemia, unspecified: Secondary | ICD-10-CM

## 2023-07-16 DIAGNOSIS — Z7985 Long-term (current) use of injectable non-insulin antidiabetic drugs: Secondary | ICD-10-CM

## 2023-07-16 LAB — CBC WITH DIFFERENTIAL/PLATELET
Basophils Absolute: 0 10*3/uL (ref 0.0–0.1)
Basophils Relative: 0.6 % (ref 0.0–3.0)
Eosinophils Absolute: 0.2 10*3/uL (ref 0.0–0.7)
Eosinophils Relative: 2.1 % (ref 0.0–5.0)
HCT: 40.8 % (ref 36.0–46.0)
Hemoglobin: 13.4 g/dL (ref 12.0–15.0)
Lymphocytes Relative: 41.3 % (ref 12.0–46.0)
Lymphs Abs: 3.1 10*3/uL (ref 0.7–4.0)
MCHC: 32.9 g/dL (ref 30.0–36.0)
MCV: 87.1 fL (ref 78.0–100.0)
Monocytes Absolute: 0.6 10*3/uL (ref 0.1–1.0)
Monocytes Relative: 7.8 % (ref 3.0–12.0)
Neutro Abs: 3.6 10*3/uL (ref 1.4–7.7)
Neutrophils Relative %: 48.2 % (ref 43.0–77.0)
Platelets: 297 10*3/uL (ref 150.0–400.0)
RBC: 4.69 Mil/uL (ref 3.87–5.11)
RDW: 13.7 % (ref 11.5–15.5)
WBC: 7.5 10*3/uL (ref 4.0–10.5)

## 2023-07-16 LAB — MICROALBUMIN / CREATININE URINE RATIO
Creatinine,U: 107.8 mg/dL
Microalb Creat Ratio: 2.6 mg/g (ref 0.0–30.0)
Microalb, Ur: 2.8 mg/dL — ABNORMAL HIGH (ref 0.0–1.9)

## 2023-07-16 LAB — HEMOGLOBIN A1C: Hgb A1c MFr Bld: 7.8 % — ABNORMAL HIGH (ref 4.6–6.5)

## 2023-07-16 LAB — LIPID PANEL
Cholesterol: 123 mg/dL (ref 0–200)
HDL: 41.6 mg/dL (ref >39.00)
LDL Cholesterol: 58 mg/dL (ref 0–99)
NonHDL: 81.88
Total CHOL/HDL Ratio: 3
Triglycerides: 117 mg/dL (ref 0.0–149.0)
VLDL: 23.4 mg/dL (ref 0.0–40.0)

## 2023-07-16 LAB — COMPREHENSIVE METABOLIC PANEL
ALT: 20 U/L (ref 0–35)
AST: 18 U/L (ref 0–37)
Albumin: 4.6 g/dL (ref 3.5–5.2)
Alkaline Phosphatase: 50 U/L (ref 39–117)
BUN: 11 mg/dL (ref 6–23)
CO2: 30 meq/L (ref 19–32)
Calcium: 9.3 mg/dL (ref 8.4–10.5)
Chloride: 105 meq/L (ref 96–112)
Creatinine, Ser: 0.47 mg/dL (ref 0.40–1.20)
GFR: 104.69 mL/min (ref >60.00)
Glucose, Bld: 110 mg/dL — ABNORMAL HIGH (ref 70–99)
Potassium: 3.9 meq/L (ref 3.5–5.1)
Sodium: 141 meq/L (ref 135–145)
Total Bilirubin: 0.4 mg/dL (ref 0.2–1.2)
Total Protein: 7 g/dL (ref 6.0–8.3)

## 2023-07-16 MED ORDER — SOLIFENACIN SUCCINATE 10 MG PO TABS
10.0000 mg | ORAL_TABLET | Freq: Every day | ORAL | 1 refills | Status: AC
Start: 2023-07-16 — End: ?

## 2023-07-16 NOTE — Assessment & Plan Note (Signed)
hgba1c to be checked, minimize simple carbs. Increase exercise as tolerated. Continue current meds  

## 2023-07-16 NOTE — Progress Notes (Signed)
Established Patient Office Visit  Subjective   Patient ID: Felicia Solis, female    DOB: Oct 27, 1965  Age: 58 y.o. MRN: [id number]  Chief Complaint  Patient presents with   Urinary Frequency   Follow-up    HPI Discussed the use of AI scribe software for clinical note transcription with the patient, who gave verbal consent to proceed.  History of Present Illness   The patient, with a history of diabetes, presents with ongoing urinary symptoms. She reports a persistent, slight pain in the lower abdominal region, which she initially associated with a potential gynecological issue. However, the patient's gynecologist appointment is not until March. The patient also reports urinary urgency, stating that when she feels the urge to urinate, she must immediately find a restroom.  The patient's diabetes management appears to be suboptimal, with her last recorded HbA1c at 8.8. She admits to not regularly checking her blood glucose levels, despite having recently purchased a glucose meter. The patient is currently on Ozempic, but is experiencing difficulties with the financial assistance program for the medication.  The patient also reports intermittent back pain, which she attributes to her mattress. She mentions a history of kidney stones and speculates that the back pain and urinary symptoms might be related to a new stone, although the symptoms have since subsided.  The patient is also concerned about her weight and is considering over-the-counter weight loss supplements. She acknowledges the need for regular exercise and expresses interest in strengthening exercises for her legs. She reports difficulty with balance and leg strength, which she believes is impacting her mobility.  The patient is currently self-pay for her medications and is exploring options for insurance coverage. She expresses frustration with the high costs associated with adding her to her spouse's insurance plan. She is also  considering applying for Medicaid.      Patient Active Problem List   Diagnosis Date Noted   Urinary frequency 07/16/2023   Right lower quadrant abdominal pain 07/16/2023   Bilateral impacted cerumen 03/26/2022   Impacted cerumen of right ear 02/15/2022   Otitis externa 02/15/2022   Other spondylosis with radiculopathy, cervical region 08/01/2021   Neck pain 07/11/2021   Skin tag 12/18/2020   Gastroenteritis 12/06/2020   COVID-19 07/03/2020   TMJ (sprain of temporomandibular joint), initial encounter 07/03/2020   Type 2 diabetes mellitus with hyperglycemia, without long-term current use of insulin (HCC) 02/25/2020   Trapezius muscle strain, left, initial encounter 08/17/2019   Left wrist pain 08/11/2019   Acute pain of left shoulder 08/11/2019   Acute nonintractable headache 08/11/2019   De Quervain's tenosynovitis, left 08/11/2019   Vertigo 06/09/2019   Right acute serous otitis media 06/09/2019   Impacted cerumen of left ear 06/09/2019   Pelvic pain 02/23/2019   HNP (herniated nucleus pulposus), cervical 01/04/2019   Epigastric mass 10/21/2018   Myalgia 03/18/2018   Hyperlipidemia associated with type 2 diabetes mellitus (HCC) 03/18/2018   Nausea and vomiting 12/02/2017   Hematuria 12/02/2017   Pansinusitis 11/19/2017   Agoraphobia with panic attacks 11/19/2017   Preventative health care 09/27/2017   Hyperlipidemia LDL goal <100 09/18/2017   Vitamin D deficiency 09/18/2017   Hypothyroidism 09/18/2017   Other intervertebral disc degeneration, lumbar region 04/07/2017   Left hip pain 04/07/2017   Hyperlipidemia LDL goal <70 04/07/2017   Primary hypertension 04/07/2017   Acute vaginitis 04/07/2017   S/P complete thyroidectomy 09/28/2016   Memory loss 08/23/2016   Thyroid nodule 08/02/2016   Plantar fasciitis of right  foot 12/01/2015   Knee pain, right 12/01/2015   Headache, migraine 09/06/2015   Bilateral knee pain 08/22/2015   Thoracic myofascial strain 08/22/2015    Dysphagia 04/10/2015   Chest pain 04/10/2015   Epigastric pain 04/10/2015   Functional diarrhea 04/10/2015   NSAID long-term use 04/10/2015   Midepigastric pain 03/22/2015   Cough 01/07/2015   Allergic rhinitis 12/13/2014   Acute bronchitis 12/13/2014   Blood in stool 10/01/2014   DM (diabetes mellitus) type II uncontrolled, periph vascular disorder 08/13/2014   Abnormal ECG 05/14/2012   Breast pain 10/23/2011   ASCUS on Pap smear 10/08/2011   H/O thyroid nodule 08/27/2011   Depression with anxiety 08/27/2011   Heavy periods 08/27/2011   Past Medical History:  Diagnosis Date   Anemia    anemia  when pregnant in 2002   Anxiety    Arthritis    "knees and back" (09/28/2016)   Asthma 5 yrs ago    told she had asthma.. does not remember doctors name.    Chronic lower back pain    Depression    takes citalopram   History of hiatal hernia    History of kidney stones    Hyperlipidemia    Hypothyroidism    LBBB (left bundle branch block)    rate-related; identified on stress test 03/21/16   Migraine    "weekly" (09/28/2016)   Positive TB test 2008   pt states she took 9 months of medicine for positive tb skin test   Shortness of breath    with exertion   Thyroid disease    Type II diabetes mellitus (HCC) dx'd 2016   Past Surgical History:  Procedure Laterality Date   DILATION AND CURETTAGE OF UTERUS  1999   ENDOMETRIAL ABLATION     EXTRACORPOREAL SHOCK WAVE LITHOTRIPSY Right 04/02/2019   Procedure: EXTRACORPOREAL SHOCK WAVE LITHOTRIPSY (ESWL);  Surgeon: Rene Paci, MD;  Location: [facility];  Service: Urology;  Laterality: Right;   LAPAROSCOPY     years ago ..pt does not know where surgery was done ... states" my stomach would blow up then go down"   THYROIDECTOMY  09/28/2011   Procedure: THYROIDECTOMY;  Surgeon: [name], MD;  Location: [facility] OR;  Service: ENT;  Laterality: Right;  RIGHT THYROIDECTOMY WITH FROZEN SECTION   THYROIDECTOMY  09/28/2016   completion of  thyroidectomy/notes 09/28/2016   THYROIDECTOMY Left 09/28/2016   Procedure: COMPLETION OF THYROIDECTOMY;  Surgeon: [name], MD;  Location: [facility] OR;  Service: ENT;  Laterality: Left;   TUBAL LIGATION     Social History   Tobacco Use   Smoking status: Never   Smokeless tobacco: Never  Vaping Use   Vaping status: Never Used  Substance Use Topics   Alcohol use: No    Alcohol/week: 0.0 standard drinks of alcohol   Drug use: No   Social History   Socioeconomic History   Marital status: Married    Spouse name: Not on file   Number of children: 3   Years of education: Not on file   Highest education level: Not on file  Occupational History   Occupation: CNA  Tobacco Use   Smoking status: Never   Smokeless tobacco: Never  Vaping Use   Vaping status: Never Used  Substance and Sexual Activity   Alcohol use: No    Alcohol/week: 0.0 standard drinks of alcohol   Drug use: No   Sexual activity: Not on file  Other Topics Concern   Not on file  Social History Narrative   No exercise--  Walking and lifting pts at work   Right handed   Lives in single story home   Social Drivers of Health   Financial Resource Strain: Not on file  Food Insecurity: Not on file  Transportation Needs: Not on file  Physical Activity: Not on file  Stress: Not on file  Social Connections: Not on file  Intimate Partner Violence: Not on file   Family Status  Relation Name Status   Mother  Alive   Sister  Alive   Father  Alive   MGM  (Not Specified)   PGF  (Not Specified)   Mat Aunt  (Not Specified)   Other  (Not Specified)   Brother  Alive   Neg Hx  (Not Specified)  No partnership data on file   Family History  Problem Relation Age of Onset   Diabetes Mother    Hypertension Mother    Hyperlipidemia Mother    Gallbladder disease Mother    Heart disease Mother    Diabetes Sister    Hypertension Father    Hyperlipidemia Father    Colon polyps Father    Ovarian cancer Maternal Grandmother     Gallbladder disease Maternal Grandmother    Breast cancer Maternal Grandmother 77   Colon cancer Paternal Grandfather    Breast cancer Maternal Aunt        late 63s   Heart disease Other        early cad   Diabetes Brother    Heart attack Brother    Anesthesia problems Neg Hx    Hypotension Neg Hx    Malignant hyperthermia Neg Hx    Pseudochol deficiency Neg Hx    Esophageal cancer Neg Hx    Allergies  Allergen Reactions   Lisinopril Cough      Review of Systems  Constitutional:  Negative for chills, fever and malaise/fatigue.  HENT:  Negative for congestion and hearing loss.   Eyes:  Negative for blurred vision and discharge.  Respiratory:  Negative for cough, sputum production and shortness of breath.   Cardiovascular:  Negative for chest pain, palpitations and leg swelling.  Gastrointestinal:  Positive for abdominal pain. Negative for blood in stool, constipation, diarrhea, heartburn, nausea and vomiting.  Genitourinary:  Positive for frequency and urgency. Negative for dysuria and hematuria.  Musculoskeletal:  Negative for back pain, falls and myalgias.  Skin:  Negative for rash.  Neurological:  Negative for dizziness, sensory change, loss of consciousness, weakness and headaches.  Endo/Heme/Allergies:  Negative for environmental allergies. Does not bruise/bleed easily.  Psychiatric/Behavioral:  Negative for depression and suicidal ideas. The patient is not nervous/anxious and does not have insomnia.       Objective:     BP 132/88 (BP Location: Right Arm, Patient Position: Sitting, Cuff Size: Normal)   Pulse 82   Temp 98.4 F (36.9 C) (Oral)   Resp 18   Ht 5\' 6"  (1.676 m)   Wt 226 lb 12.8 oz (102.9 kg)   SpO2 99%   BMI 36.61 kg/m  BP Readings from Last 3 Encounters:  07/16/23 132/88  07/02/23 120/88  02/28/23 122/88   Wt Readings from Last 3 Encounters:  07/16/23 226 lb 12.8 oz (102.9 kg)  07/02/23 223 lb 3.2 oz (101.2 kg)  02/28/23 222 lb (100.7 kg)    SpO2 Readings from Last 3 Encounters:  07/16/23 99%  07/02/23 98%  02/28/23 96%      Physical Exam Vitals and  nursing note reviewed.  Constitutional:      General: She is not in acute distress.    Appearance: Normal appearance. She is well-developed.  HENT:     Head: Normocephalic and atraumatic.  Eyes:     General: No scleral icterus.       Right eye: No discharge.        Left eye: No discharge.  Cardiovascular:     Rate and Rhythm: Normal rate and regular rhythm.     Heart sounds: No murmur heard. Pulmonary:     Effort: Pulmonary effort is normal. No respiratory distress.     Breath sounds: Normal breath sounds.  Musculoskeletal:        General: Tenderness present. Normal range of motion.     Cervical back: Normal range of motion and neck supple.     Right lower leg: No edema.     Left lower leg: No edema.  Skin:    General: Skin is warm and dry.  Neurological:     Mental Status: She is alert and oriented to person, place, and time.  Psychiatric:        Mood and Affect: Mood normal.        Behavior: Behavior normal.        Thought Content: Thought content normal.        Judgment: Judgment normal.      No results found for any visits on 07/16/23.  Last CBC Lab Results  Component Value Date   WBC 8.2 10/24/2022   HGB 12.9 10/24/2022   HCT 38.2 10/24/2022   MCV 86.4 10/24/2022   MCH 28.2 08/02/2021   RDW 13.6 10/24/2022   PLT 270.0 10/24/2022   Last metabolic panel Lab Results  Component Value Date   GLUCOSE 83 10/24/2022   NA 142 10/24/2022   K 3.4 (L) 10/24/2022   CL 106 10/24/2022   CO2 29 10/24/2022   BUN 12 10/24/2022   CREATININE 0.52 10/24/2022   GFR 102.69 10/24/2022   CALCIUM 9.0 10/24/2022   PROT 7.1 10/24/2022   ALBUMIN 4.3 10/24/2022   BILITOT 0.6 10/24/2022   ALKPHOS 37 (L) 10/24/2022   AST 25 10/24/2022   ALT 25 10/24/2022   ANIONGAP 10 08/02/2021   Last lipids Lab Results  Component Value Date   CHOL 100 07/09/2022   HDL  36.30 (L) 07/09/2022   LDLCALC 42 07/09/2022   LDLDIRECT 66.0 03/26/2022   TRIG 106.0 07/09/2022   CHOLHDL 3 07/09/2022   Last hemoglobin A1c Lab Results  Component Value Date   HGBA1C 8.8 (H) 03/26/2022   Last thyroid functions Lab Results  Component Value Date   TSH 0.78 10/24/2022   T4TOTAL 8.0 07/28/2019   Last vitamin D Lab Results  Component Value Date   VD25OH 42.39 03/26/2022   Last vitamin B12 and Folate Lab Results  Component Value Date   VITAMINB12 744 10/24/2022      The ASCVD Risk score (Arnett DK, et al., 2019) failed to calculate for the following reasons:   The valid total cholesterol range is 130 to 320 mg/dL    Assessment & Plan:   Problem List Items Addressed This Visit       Unprioritized   Primary hypertension   Relevant Orders   POCT Urinalysis Dipstick (Automated)   CBC with Differential/Platelet   Comprehensive metabolic panel   Lipid panel   Hyperlipidemia associated with type 2 diabetes mellitus (HCC)   Urinary frequency   Relevant Medications  solifenacin (VESICARE) 10 MG tablet   Other Relevant Orders   Hemoglobin A1c   Type 2 diabetes mellitus with hyperglycemia, without long-term current use of insulin (HCC) - Primary   hgba1c to be checked, minimize simple carbs. Increase exercise as tolerated. Continue current meds       Relevant Orders   Hemoglobin A1c   Microalbumin / creatinine urine ratio   Right lower quadrant abdominal pain   Relevant Orders   CT RENAL STONE STUDY  Assessment and Plan    Pelvic Pain Intermittent pelvic pain has recently worsened, with no associated incontinence. The differential diagnosis includes kidney stones. A CT scan and urine test are planned to check for stones and hematuria. Order a urine test for hematuria and a CT scan for kidney stones.  Diabetes Mellitus Type 2 A1c is elevated at 8.8, and blood glucose is not regularly monitored. Currently on Ozempic 2 mg for diabetes and weight  loss, but there are financial issues with the medication. Discussed the [employer] for [facility] as an alternative and encouraged regular glucose monitoring. Order blood work to assess glucose levels and discuss the [employer] for [employer].  Urinary Incontinence Reports urgency and occasional leakage without a clear diagnosis. Prescribed incontinence medication and advised a trial if affordable.  General Health Maintenance Inquired about weight loss supplements and leg strengthening exercises. Discussed the safe use of supplements and recommended exercises such as mini squats, leg extensions, and balance exercises. Encouraged the use of ankle weights.  Follow-up Follow up with GYN in March. Contact the financial assistance program for Ozempic. Review urine and blood test results and follow up on CT scan results.         +  No follow-ups on file.    Donato Schultz, DO

## 2023-07-18 ENCOUNTER — Telehealth (HOSPITAL_BASED_OUTPATIENT_CLINIC_OR_DEPARTMENT_OTHER): Payer: Self-pay

## 2023-07-19 ENCOUNTER — Encounter: Payer: Self-pay | Admitting: Podiatry

## 2023-07-19 ENCOUNTER — Ambulatory Visit (INDEPENDENT_AMBULATORY_CARE_PROVIDER_SITE_OTHER): Payer: Self-pay | Admitting: Podiatry

## 2023-07-19 ENCOUNTER — Ambulatory Visit (HOSPITAL_BASED_OUTPATIENT_CLINIC_OR_DEPARTMENT_OTHER): Payer: Self-pay

## 2023-07-19 DIAGNOSIS — B351 Tinea unguium: Secondary | ICD-10-CM

## 2023-07-19 DIAGNOSIS — M79674 Pain in right toe(s): Secondary | ICD-10-CM

## 2023-07-19 DIAGNOSIS — M79675 Pain in left toe(s): Secondary | ICD-10-CM

## 2023-07-19 DIAGNOSIS — E1142 Type 2 diabetes mellitus with diabetic polyneuropathy: Secondary | ICD-10-CM

## 2023-07-19 NOTE — Patient Instructions (Signed)
Look for urea 40% cream or ointment and apply to the thickened dry skin / calluses. This can be bought over the counter, at a pharmacy or online such as [employer].  Some products also, the combination of salicylic acid that can be helpful  You may also scrub callused areas with white vinegar prior to showering and gently file them down with emery board or pumice stone.  Some examples of over-the-counter urea cream which to help with moisturization and lower concentrations are AmLactin or Lac-Hydrin

## 2023-07-19 NOTE — Progress Notes (Unsigned)
  Subjective:  Patient ID: Felicia Solis, female    DOB: [birth year],  MRN: [id number]  Chief Complaint  Patient presents with   Diabetic Foot Exam    LT 2nd hallux has a callus. Last A1c: 7.8. No anticoag.     58 y.o. female presents with the above complaint. History confirmed with patient. Patient presenting with pain related to dystrophic thickened elongated nails. Patient is unable to trim own nails related to nail dystrophy and/or mobility issues, last A1c 7.8.Marland Kitchen Patient does have a history of T2DM. Patient does relate pincer callus present to the left second toe distal tuft.  Objective:  Physical Exam: warm, good capillary refill nail exam with nail thickeninng onycholysis, and dystrophic nails DP pulses palpable, PT pulses palpable, protective sensation intact, and vibratory sensation diminished Left Foot:  Pain with palpation of nails due to elongation and dystrophic growth.  Pincer callus present left second toe. Does have nevus present to plantar foot within normal limits for shape, size and borders. Right Foot: Pain with palpation of nails due to elongation and dystrophic growth.   Assessment:   1. Pain due to onychomycosis of toenails of both feet   2. Diabetic polyneuropathy associated with type 2 diabetes mellitus (HCC)      Plan:  Patient was evaluated and treated and all questions answered.  #Hyperkeratotic lesions/pre ulcerative calluses present left second toe distal aspect All symptomatic hyperkeratoses x 1 separate lesions were safely debrided as a courtesy with a sterile #15 blade to patient's level of comfort without incident. We discussed preventative and palliative care of these lesions including supportive and accommodative shoegear, padding, prefabricated and custom molded accommodative orthoses, use of a pumice stone and lotions/creams daily.  #Onychomycosis with pain  -Nails palliatively debrided as below. -Educated on self-care  Procedure: Nail  Debridement Rationale: Pain Type of Debridement: manual, sharp debridement. Instrumentation: Nail nipper, rotary burr. Number of Nails: 10  Patient educated on diabetes. Discussed proper diabetic foot care and discussed risks and complications of disease. Educated patient in depth on reasons to return to the office immediately should he/she discover anything concerning or new on the feet. All questions answered. Discussed proper shoes as well.    Patient has requested that we monitor the nevus to the plantar left foot.  Photodocumentation provided in chart.  Return in about 3 months (around 10/17/2023) for Diabetic Foot Care.         Bronwen Betters, DPM [facility]

## 2023-07-20 ENCOUNTER — Encounter: Payer: Self-pay | Admitting: Family Medicine

## 2023-07-24 ENCOUNTER — Telehealth: Payer: Self-pay | Admitting: Family Medicine

## 2023-07-24 NOTE — Telephone Encounter (Signed)
Patient came in about forms for norvo nordisk pap. She did get in touch with them and we did get forms and a letter that was needed faxed back in.

## 2023-07-24 NOTE — Telephone Encounter (Signed)
Patient would like a call back from the medical asst patient did not say what this was in regards to

## 2023-07-27 ENCOUNTER — Other Ambulatory Visit: Payer: Self-pay | Admitting: Family Medicine

## 2023-07-29 NOTE — Telephone Encounter (Signed)
Okay to change manufacturer.

## 2023-07-29 NOTE — Telephone Encounter (Unsigned)
Copied from CRM [id number]. Topic: Clinical - Prescription Issue >> Jul 29, 2023 11:22 AM Corin V wrote: Reason for CRM: [facility] phamacy calling due to the manufacturer not being able to fill the prescription for the levothyroxine (SYNTHROID) 88 MCG tablet. They are needing approval to fill through a different manufacturer. Please call pharmacy back.

## 2023-07-31 ENCOUNTER — Telehealth: Payer: Self-pay

## 2023-07-31 MED ORDER — LEVOTHYROXINE SODIUM 88 MCG PO TABS: 88.0000 ug | ORAL_TABLET | Freq: Every day | ORAL | 0 refills | Status: DC

## 2023-07-31 NOTE — Telephone Encounter (Signed)
Rx sent to [facility] informing okay to change manufacturer if needed.

## 2023-07-31 NOTE — Telephone Encounter (Signed)
 Copied from CRM [id number]. Topic: Clinical - Prescription Issue >> Jul 31, 2023  9:23 AM Macario HERO wrote: Reason for CRM: Catherin Clause from [facility] called - need permission to change the manufacture that they have for patient medication levothyroxine  (SYNTHROID ) 88 MCG tablet [[id number]].  Phone: [phone]

## 2023-08-03 ENCOUNTER — Other Ambulatory Visit: Payer: Self-pay | Admitting: Family Medicine

## 2023-08-18 ENCOUNTER — Other Ambulatory Visit: Payer: Self-pay | Admitting: Family Medicine

## 2023-08-27 NOTE — Telephone Encounter (Unsigned)
 Copied from CRM [id number]. 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

## 2023-08-29 ENCOUNTER — Telehealth: Payer: Self-pay | Admitting: *Deleted

## 2023-08-29 DIAGNOSIS — E1165 Type 2 diabetes mellitus with hyperglycemia: Secondary | ICD-10-CM

## 2023-08-29 MED ORDER — SEMAGLUTIDE(0.25 OR 0.5MG/DOS) 2 MG/3ML ~~LOC~~ SOPN: 2.0000 mg | PEN_INJECTOR | SUBCUTANEOUS | 0 refills | Status: DC

## 2023-08-29 NOTE — Telephone Encounter (Signed)
 Copied from CRM [id number]. Topic: Clinical - Medication Question >> Aug 29, 2023  9:56 AM Kathryne Eriksson wrote: Reason for CRM: [facility] >> Aug 29, 2023  9:59 AM Kathryne Eriksson wrote: Patient is calling in again, stating that [facility] is requesting for a 30 day supply of OZEMPIC be sent to patient's pharmacy. The correct pharmacy is  [facility] [address], [place] - [address]  [address] [place] [address]  Phone: [phone] Fax: [phone]  Hours: Not open 24 hours

## 2023-08-29 NOTE — Telephone Encounter (Signed)
 Please advise. Medication not on med list currently.

## 2023-08-29 NOTE — Telephone Encounter (Signed)
 Rx sent

## 2023-08-30 ENCOUNTER — Telehealth: Payer: Self-pay | Admitting: Family Medicine

## 2023-08-30 NOTE — Telephone Encounter (Signed)
 Per the office note, pt is on 2mg  Ozempic, right?

## 2023-08-30 NOTE — Telephone Encounter (Signed)
 Copied from CRM [id number]. Topic: Clinical - Medication Question >> Aug 30, 2023  9:11 AM Gurney Maxin H wrote: Reason for CRM: Felicia Solis calling from [facility] to clarify directions for the Semaglutide,0.25 or 0.5MG /DOS, 2 MG/3ML SOPN, states the starting dose is usually .25 or 0.5mg  and instructions are written to start at 2mg , sates correct pen wrong directions.  [employer]  [phone]

## 2023-09-02 ENCOUNTER — Telehealth: Payer: Self-pay | Admitting: Family Medicine

## 2023-09-02 MED ORDER — SEMAGLUTIDE (2 MG/DOSE) 8 MG/3ML ~~LOC~~ SOPN: 2.0000 mg | PEN_INJECTOR | SUBCUTANEOUS | 0 refills | Status: DC

## 2023-09-02 NOTE — Telephone Encounter (Signed)
 Called pharmacy. Clarified that dosing is correct but the wrong pen was sent. Correct pen sent in

## 2023-09-02 NOTE — Telephone Encounter (Signed)
 Pt came in office stating is needing her refill for Ozempic - pt mentioned that has not been getting her meds for 2 month due to pharmacy was getting the wrong dose of Ozempic, pt states the correct dose should be 2mg  (ozempic) Pt is leaving town this wk and needing meds ASAP. Please contact pt when meds are sent to pharmacy,  pt tel [phone].

## 2023-09-02 NOTE — Telephone Encounter (Signed)
 Attempted to call pharmacy. Pharmacy closed. Opens at 2pm

## 2023-09-12 ENCOUNTER — Telehealth: Payer: Self-pay

## 2023-09-12 NOTE — Telephone Encounter (Signed)
Pt made aware that Ozempic has arrived. Placed in [place].

## 2023-09-18 ENCOUNTER — Other Ambulatory Visit: Payer: Self-pay | Admitting: Obstetrics and Gynecology

## 2023-09-20 LAB — SURGICAL PATHOLOGY

## 2023-09-28 ENCOUNTER — Encounter (HOSPITAL_BASED_OUTPATIENT_CLINIC_OR_DEPARTMENT_OTHER): Payer: Self-pay

## 2023-09-28 ENCOUNTER — Other Ambulatory Visit: Payer: Self-pay

## 2023-09-28 DIAGNOSIS — J45909 Unspecified asthma, uncomplicated: Secondary | ICD-10-CM | POA: Insufficient documentation

## 2023-09-28 DIAGNOSIS — S60465A Insect bite (nonvenomous) of left ring finger, initial encounter: Secondary | ICD-10-CM | POA: Insufficient documentation

## 2023-09-28 DIAGNOSIS — Z794 Long term (current) use of insulin: Secondary | ICD-10-CM | POA: Insufficient documentation

## 2023-09-28 DIAGNOSIS — Z79899 Other long term (current) drug therapy: Secondary | ICD-10-CM | POA: Insufficient documentation

## 2023-09-28 DIAGNOSIS — E039 Hypothyroidism, unspecified: Secondary | ICD-10-CM | POA: Insufficient documentation

## 2023-09-28 DIAGNOSIS — W57XXXA Bitten or stung by nonvenomous insect and other nonvenomous arthropods, initial encounter: Secondary | ICD-10-CM | POA: Insufficient documentation

## 2023-09-28 DIAGNOSIS — E119 Type 2 diabetes mellitus without complications: Secondary | ICD-10-CM | POA: Insufficient documentation

## 2023-09-28 DIAGNOSIS — Z7984 Long term (current) use of oral hypoglycemic drugs: Secondary | ICD-10-CM | POA: Insufficient documentation

## 2023-09-28 NOTE — ED Triage Notes (Signed)
 Pt presents via POV c/o insect bite to left ring finger tonight. Reports does not know what bit her.

## 2023-09-29 ENCOUNTER — Emergency Department (HOSPITAL_BASED_OUTPATIENT_CLINIC_OR_DEPARTMENT_OTHER)
Admission: EM | Admit: 2023-09-29 | Discharge: 2023-09-29 | Disposition: A | Discharge status: Home / Self Care | Payer: Self-pay | Attending: Emergency Medicine | Admitting: Emergency Medicine

## 2023-09-29 DIAGNOSIS — M7989 Other specified soft tissue disorders: Secondary | ICD-10-CM

## 2023-09-29 NOTE — ED Provider Notes (Signed)
 [facility] EMERGENC[facility] DEPARTMENT AT [facility] Provider Note   CSN: [id number] Arrival date & time: 09/28/23  2330     History  Chief Complaint  Patient presents with   Insect Bite    Felicia Solis is a 58 y.o. female.  HPI     This is a 58 year old female who presents with concern for possible insect bite to the left ring finger.  She states that she was getting her mail out of her mailbox when she felt like something bit her on the left ring finger.  She had immediate swelling.  No itching.  Did not take anything for the symptoms.  Since that time, swelling has subsided.  She denies any pain.  Has not had any systemic symptoms.  Home Medications Prior to Admission medications   Medication Sig Start Date End Date Taking? Authorizing Provider  Ascorbic Acid (VITAMIN C) 100 MG tablet Take 100 mg by mouth daily.    [provider]  blood glucose meter kit and supplies KIT Dispense based on patient and insurance preference. Use up to four times daily as directed. (FOR ICD-9 250.00, 250.01). 09/17/17   Zola Button, Grayling Congress, DO  celecoxib (CELEBREX) 100 MG capsule Take 1 capsule (100 mg total) by mouth daily. 07/11/22   Donato Schultz, DO  Cholecalciferol (VITAMIN D3) 2000 units TABS Take 2,000 Units by mouth daily.     [provider]  cyanocobalamin 500 MCG tablet Take 1,000 mcg by mouth daily.     [provider]  desvenlafaxine (PRISTIQ) 50 MG 24 hr tablet Take 1 tablet (50 mg total) by mouth daily. 02/28/23   Seabron Spates R, DO  dorzolamide-timolol (COSOPT) 22.3-6.8 MG/ML ophthalmic solution 1 drop 2 (two) times daily. 01/19/22   [provider]  Flaxseed, Linseed, (FLAXSEED OIL PO) Take 1,300 mg by mouth daily.    [provider]  fluticasone (FLONASE) 50 MCG/ACT nasal spray Place 2 sprays into both nostrils daily. 03/26/22   Donato Schultz, DO  glimepiride (AMARYL) 2 MG tablet TAKE 1 TABLET BY MOUTH ONCE DAILY  BEFORE BREAKFAST 08/19/23   Zola Button, Grayling Congress, DO  glucose blood (ONETOUCH VERIO) test strip [facility] Check blood sugar twice daily 05/10/20   Zola Button, Grayling Congress, DO  levothyroxine (SYNTHROID) 88 MCG tablet TAKE 1 TABLET BY MOUTH ONCE DAILY BEFORE BREAKFAST 08/05/23   Zola Button, Yvonne R, DO  LORazepam (ATIVAN) 0.5 MG tablet Take 1 tablet (0.5 mg total) by mouth 2 (two) times daily as needed for anxiety. 02/28/23   Donato Schultz, DO  losartan (COZAAR) 50 MG tablet Take 1 tablet by mouth once daily 05/20/23   Zola Button, Grayling Congress, DO  methocarbamol (ROBAXIN) 500 MG tablet Take 2 tablets (1,000 mg total) by mouth every 6 (six) hours as needed for muscle spasms. 12/29/22   Alvira Monday, MD  Omega-3 Fatty Acids (FISH OIL) 1200 MG CAPS Take 1,200 mg by mouth daily.    [provider]  rosuvastatin (CRESTOR) 20 MG tablet Take 1 tablet by mouth once daily 05/20/23   Zola Button, Grayling Congress, DO  Semaglutide, 2 MG/DOSE, 8 MG/3ML SOPN Inject 2 mg as directed once a week. 09/02/23   Donato Schultz, DO  solifenacin (VESICARE) 10 MG tablet Take 1 tablet (10 mg total) by mouth daily. 07/16/23   Seabron Spates R, DO  timolol (TIMOPTIC) 0.5 % ophthalmic solution 1 drop 2 (two) times daily. 11/30/21  [provider]      Allergies    Lisinopril    Review of Systems   Review of Systems  Musculoskeletal:        Finger swelling  Skin:  Negative for color change and wound.  All other systems reviewed and are negative.   Physical Exam Updated Vital Signs BP (!) 151/83   Pulse 85   Temp 98.2 F (36.8 C)   Resp 14   SpO2 99%  Physical Exam Vitals and nursing note reviewed.  Constitutional:      Appearance: She is well-developed. She is obese. She is not ill-appearing.  HENT:     Head: Normocephalic and atraumatic.  Eyes:     Pupils: Pupils are equal, round, and reactive to light.  Cardiovascular:     Rate and Rhythm: Normal rate and regular rhythm.      Heart sounds: Normal heart sounds.  Pulmonary:     Effort: Pulmonary effort is normal. No respiratory distress.     Breath sounds: No wheezing.  Abdominal:     Palpations: Abdomen is soft.  Musculoskeletal:     Cervical back: Neck supple.     Comments: Focused examination of the left hand with no obvious bite or puncture wound to the left ring finger, there is no swelling, there is normal flexion and extension of all 5 digits, 2+ radial pulse, no overlying skin changes  Skin:    General: Skin is warm and dry.  Neurological:     Mental Status: She is alert and oriented to person, place, and time.  Psychiatric:        Mood and Affect: Mood normal.     ED Results / Procedures / Treatments   Labs (all labs ordered are listed, but only abnormal results are displayed) Labs Reviewed - No data to display  EKG None  Radiology No results found.  Procedures Procedures    Medications Ordered in ED Medications - No data to display  ED Course/ Medical Decision Making/ A&P                                 Medical Decision Making  This patient presents to the ED for concern of finger swelling, this involves an extensive number of treatment options, and is a complaint that carries with it a high risk of complications and morbidity.  I considered the following differential and admission for this acute, potentially life threatening condition.  The differential diagnosis includes bite, allergic reaction  MDM:    This is a 58 year old female who presents with concern for bite to the left ring finger.  She is nontoxic-appearing and vital signs are reassuring.  No active swelling noted.  No obvious bite.  Flexion and extension of the digit is intact.  Her exam is essentially normal.  Unsure what may have happened or whether she was actually bitten but there is no evidence of cellulitis or ongoing delay of a bite.  Patient overall reassured.  (Labs, imaging, consults)  Labs: I Ordered, and  personally interpreted labs.  The pertinent results include: N/A  Imaging Studies ordered: I ordered imaging studies including N/A I independently visualized and interpreted imaging. I agree with the radiologist interpretation  Additional history obtained from chart review.  External records from outside source obtained and reviewed including prior evaluations  Cardiac Monitoring: The patient was not maintained on a cardiac monitor.  If on the cardiac  monitor, I personally viewed and interpreted the cardiac monitored which showed an underlying rhythm of: N/A  Reevaluation: After the interventions noted above, I reevaluated the patient and found that they have :resolved  Social Determinants of Health:  lives independently  Disposition: Discharge  Co morbidities that complicate the patient evaluation  Past Medical History:  Diagnosis Date   Anemia    anemia  when pregnant in 2002   Anxiety    Arthritis    "knees and back" (09/28/2016)   Asthma 5 yrs ago    told she had asthma.. does not remember doctors name.    Chronic lower back pain    Depression    takes citalopram   History of hiatal hernia    History of kidney stones    Hyperlipidemia    Hypothyroidism    LBBB (left bundle branch block)    rate-related; identified on stress test 03/21/16   Migraine    "weekly" (09/28/2016)   Positive TB test 2008   pt states she took 9 months of medicine for positive tb skin test   Shortness of breath    with exertion   Thyroid disease    Type II diabetes mellitus (HCC) dx'd 2016     Medicines No orders of the defined types were placed in this encounter.   I have reviewed the patients home medicines and have made adjustments as needed  Problem List / ED Course: Problem List Items Addressed This Visit   None Visit Diagnoses       Swelling of finger    -  Primary                   Final Clinical Impression(s) / ED Diagnoses Final diagnoses:  Swelling of  finger    Rx / DC Orders ED Discharge Orders     None         Shon Baton, MD 09/29/23 0102

## 2023-09-29 NOTE — Discharge Instructions (Signed)
 Are seen today for potential insect bite to the left finger.  You noted swelling that has since improved.  Your exam is pretty normal.  If swelling recurs, ice and take ibuprofen.  You may also take Benadryl.

## 2023-10-16 ENCOUNTER — Telehealth (INDEPENDENT_AMBULATORY_CARE_PROVIDER_SITE_OTHER): Payer: Self-pay | Admitting: Medical

## 2023-10-16 ENCOUNTER — Ambulatory Visit: Payer: Self-pay | Admitting: Medical

## 2023-10-16 DIAGNOSIS — R1013 Epigastric pain: Secondary | ICD-10-CM

## 2023-10-16 DIAGNOSIS — R110 Nausea: Secondary | ICD-10-CM

## 2023-10-16 DIAGNOSIS — J301 Allergic rhinitis due to pollen: Secondary | ICD-10-CM

## 2023-10-16 MED ORDER — FLUTICASONE PROPIONATE 50 MCG/ACT NA SUSP: 2.0000 | Freq: Every day | NASAL | 1 refills | Status: AC

## 2023-10-16 MED ORDER — ONDANSETRON HCL 4 MG PO TABS: 4.0000 mg | ORAL_TABLET | Freq: Three times a day (TID) | ORAL | 0 refills | Status: AC | PRN

## 2023-10-16 MED ORDER — FAMOTIDINE 20 MG PO TABS: 20.0000 mg | ORAL_TABLET | Freq: Every day | ORAL | 0 refills | Status: AC

## 2023-10-16 NOTE — Patient Instructions (Signed)
 Nausea with Gerd/reflux symptoms Nausea, cramping, and reflux .Bacterial etiology less likely without diarrhea. Advised against antibiotics to prevent C. diff. Discussed dietary modifications. - Prescribe famotidine  20 mg once daily for reflux. - Prescribe Zofran  for nausea. - Recommend bland diet, avoid fried foods, acidic foods, caffeine, alcohol, chocolate. - Advise to update via MyChart by Friday if symptoms change or worsen, especially if diarrhea develops. - Prepare to order stool panel if diarrhea develops. - Consider CBC, metabolic panel, lipase if abdominal pain increases.  Headache with sinus involvement Headache with sinus pressure, possibly allergy-related. Not using nasal spray. Recommended Flonase . - Prescribe Flonase  nasal spray. - Recommend checking vitals at home, including blood pressure and pulse, update via MyChart. - Check oxygen saturation with pulse oximeter if available. - Advise to update via MyChart by Friday with symptom changes.

## 2023-10-16 NOTE — Progress Notes (Deleted)
   Subjective:    Patient ID: Felicia Solis, female    DOB: Mar 11, 1966, 58 y.o.   MRN: [id number]  HPI      Review of Systems  Constitutional:  Negative for chills, fatigue and fever.  Respiratory:  Negative for cough, chest tightness, shortness of breath and wheezing.   Cardiovascular:  Negative for chest pain and palpitations.  Gastrointestinal:  Positive for abdominal pain and nausea. Negative for blood in stool and diarrhea.       Last bm yesterday.  Genitourinary:  Negative for difficulty urinating and flank pain.  Musculoskeletal:  Negative for back pain and joint swelling.  Neurological:  Positive for headaches. Negative for numbness.       Low level. Ha. Hx of ha.  Hematological:  Negative for adenopathy.  Psychiatric/Behavioral:  Negative for behavioral problems and decreased concentration.         Objective:   Physical Exam        Assessment & Plan:

## 2023-10-16 NOTE — Progress Notes (Signed)
 Virtual Visit via Video Note  I connected with Felicia Solis on 10/16/23 at 10:20 AM EDT by a video enabled telemedicine application and verified that I am speaking with the correct person using two identifiers.  Location: Patient: [place] in car. She parked to do visit. Provider: office. [place]   I discussed the limitations of evaluation and management by telemedicine and the availability of in person appointments. The patient expressed understanding and agreed to proceed.  Asked pt to check bp and pulse notify me by my chart later today.  History of Present Illness: Discussed the use of AI scribe software for clinical note transcription with the patient, who gave verbal consent to proceed.  History of Present Illness         Felicia Solis is a 58 year old female who presents with nausea and abdominal cramping after returning from a trip to [place] .  She has been experiencing nausea and abdominal cramping since Saturday night, following her return from a trip to [place]  on Monday. There is no vomiting, but she describes a sensation of nausea and some reflux-type symptoms. No diarrhea, but she had a soft bowel movement yesterday.  Her three children, who accompanied her on the trip, have also experienced gastrointestinal symptoms. One daughter was diagnosed with food poisoning, experiencing vomiting and diarrhea. Another daughter had vomiting, and her son had diarrhea.  She reports a low-level headache, which she attributes to sinus issues, as she frequently experiences sinus infections. The headache is located in the frontal and temple areas.  She has a history of allergies and uses Pataday eye drops for itching, which has been effective. She has not used a nasal spray recently.  She mentions a past history of taking medication for acid reflux, specifically famotidine , but is not currently on any reflux medication.   Review of Systems  Constitutional:  Negative for chills, fatigue and fever.   Respiratory:  Negative for cough, chest tightness, shortness of breath and wheezing.   Cardiovascular:  Negative for chest pain and palpitations.  Gastrointestinal:  Positive for abdominal pain and nausea. Negative for blood in stool and diarrhea.       Last bm yesterday.  Genitourinary:  Negative for difficulty urinating and flank pain.  Musculoskeletal:  Negative for back pain and joint swelling.  Neurological:  Positive for headaches. Negative for numbness.       Low level. Ha. Hx of ha.  Hematological:  Negative for adenopathy.  Psychiatric/Behavioral:  Negative for behavioral problems and decreased concentration   Observations/Objective: General-no acute distress, pleasant, oriented. Lungs- on inspection lungs appear unlabored. Neck- no tracheal deviation or jvd on inspection. Neuro- gross motor function appears intact.   A/P     Nausea with Gerd/reflux symptoms Nausea, cramping, and reflux .Bacterial etiology less likely without diarrhea. Advised against antibiotics to prevent C. diff. Discussed dietary modifications. - Prescribe famotidine  20 mg once daily for reflux. - Prescribe Zofran  for nausea. - Recommend bland diet, avoid fried foods, acidic foods, caffeine, alcohol, chocolate. - Advise to update via MyChart by Friday if symptoms change or worsen, especially if diarrhea develops. - Prepare to order stool panel if diarrhea develops. - Consider CBC, metabolic panel, lipase if abdominal pain increases.  Headache with sinus involvement Headache with sinus pressure, possibly allergy-related. Not using nasal spray. Recommended Flonase . - Prescribe Flonase  nasal spray. - Recommend checking vitals at home, including blood pressure and pulse, update via MyChart. - Check oxygen saturation with pulse oximeter if available. - Advise  to update via MyChart by Friday with symptom changes.            Follow Up Instructions:    I discussed the assessment and treatment plan  with the patient. The patient was provided an opportunity to ask questions and all were answered. The patient agreed with the plan and demonstrated an understanding of the instructions.   The patient was advised to call back or seek an in-person evaluation if the symptoms worsen or if the condition fails to improve as anticipated.   Theone Bowell, PA-C

## 2023-10-18 ENCOUNTER — Ambulatory Visit: Payer: Self-pay | Admitting: Podiatry

## 2023-11-01 ENCOUNTER — Telehealth: Payer: Self-pay | Admitting: Family Medicine

## 2023-11-01 DIAGNOSIS — E1165 Type 2 diabetes mellitus with hyperglycemia: Secondary | ICD-10-CM

## 2023-11-01 NOTE — Telephone Encounter (Signed)
 Copied from CRM [id number]. Topic: Referral - Request for Referral >> Nov 01, 2023 11:30 AM Chuck Crater wrote: Did the patient discuss referral with their provider in the last year? No (If No - schedule appointment) (If Yes - send message)  Appointment offered? Yes  Type of order/referral and detailed reason for visit: Diabetes Doctor   Preference of office, provider, location: [facility]   If referral order, have you been seen by this specialty before? No (If Yes, this issue or another issue? When? Where?  Can we respond through MyChart? Yes

## 2023-11-04 NOTE — Addendum Note (Signed)
 Addended by: Ysidro Her on: 11/04/2023 08:55 AM   Modules accepted: Orders

## 2023-11-04 NOTE — Telephone Encounter (Signed)
Referral placed to Endo.  

## 2023-11-20 ENCOUNTER — Telehealth: Payer: Self-pay

## 2023-11-20 ENCOUNTER — Telehealth: Payer: Self-pay | Admitting: Family Medicine

## 2023-11-20 NOTE — Telephone Encounter (Signed)
 Copied from CRM [id number]. Topic: General - Other >> Nov 20, 2023  2:22 PM Taleah C wrote: Reason for CRM: [facility] called and stated that they need a copy of the patients insurance to be sent to them via fax. Fax: [phone].

## 2023-11-20 NOTE — Telephone Encounter (Signed)
 Can you update endo referral please?

## 2023-11-20 NOTE — Telephone Encounter (Signed)
 Copied from CRM [id number]. Topic: Referral - Status >> Nov 19, 2023  3:45 PM Clyde Darling P wrote: Reason for CRM: Pt advise she wanted to be referred to [facility] associate for endocrinology with Arlice Bene. [place] as the provider

## 2023-11-20 NOTE — Telephone Encounter (Signed)
 Referral faxed to [facility] as requested and MyChart message sent to patient as well.

## 2023-11-21 NOTE — Telephone Encounter (Signed)
 Referral re-faxed, to include insurance card.

## 2023-12-04 ENCOUNTER — Encounter: Payer: Self-pay | Admitting: Family Medicine

## 2023-12-04 ENCOUNTER — Ambulatory Visit (INDEPENDENT_AMBULATORY_CARE_PROVIDER_SITE_OTHER): Payer: Self-pay | Admitting: Family Medicine

## 2023-12-04 VITALS — BP 114/71 | HR 77 | Ht 66.0 in | Wt 219.0 lb

## 2023-12-04 DIAGNOSIS — L308 Other specified dermatitis: Secondary | ICD-10-CM

## 2023-12-04 DIAGNOSIS — Z7985 Long-term (current) use of injectable non-insulin antidiabetic drugs: Secondary | ICD-10-CM

## 2023-12-04 DIAGNOSIS — E1165 Type 2 diabetes mellitus with hyperglycemia: Secondary | ICD-10-CM

## 2023-12-04 DIAGNOSIS — R5383 Other fatigue: Secondary | ICD-10-CM

## 2023-12-04 DIAGNOSIS — Z7984 Long term (current) use of oral hypoglycemic drugs: Secondary | ICD-10-CM

## 2023-12-04 DIAGNOSIS — E890 Postprocedural hypothyroidism: Secondary | ICD-10-CM

## 2023-12-04 MED ORDER — TRIAMCINOLONE ACETONIDE 0.1 % EX CREA
1.0000 | TOPICAL_CREAM | Freq: Two times a day (BID) | CUTANEOUS | 0 refills | Status: AC
Start: 2023-12-04 — End: ?

## 2023-12-04 NOTE — Progress Notes (Signed)
 Acute Office Visit  Subjective:     Patient ID: Felicia Solis, female    DOB: 12/22/1965, 58 y.o.   MRN: [id number]  Chief Complaint  Patient presents with   Fatigue    HPI Patient is in today for fatigue and rash.   Discussed the use of AI scribe software for clinical note transcription with the patient, who gave verbal consent to proceed.  History of Present Illness Felicia Solis is a 58 year old female with diabetes who presents with fatigue and a rash on her chest.  She experiences persistent fatigue and low energy levels, describing herself as 'tired nonstop' and wanting to nap daily. Despite this fatigue, she does not fall asleep during activities such as driving or conversations. Her sleep at night is variable, with occasional tossing and turning, but she generally achieves 7 to 8 hours of sleep. Her husband reports that she snores, but she does not wake up gasping or choking. She occasionally experiences 'brain fog'. She has not undergone a sleep study before. She has a history of anemia related to heavy periods years ago and currently takes an over-the-counter iron pill once a day. Her family history includes anemia requiring iron infusions in her daughter, father, and sister.  She reports a rash on her chest that appeared a little over a week ago. The rash is itchy, especially after touching or rubbing it post-shower. She has not applied any treatments to it and denies any recent changes in soaps, detergents, or skincare products. Initially, she thought it might be related to a new hairstyle, but the rash has persisted. The rash is slightly raised with small bumps and is confined to her upper chest.  She is on Ozempic  and glimepiride , both at 2 mg once daily, for diabetes management. Her A1c was noted to be high six months ago. She takes losartan  for blood pressure management, which is also used for kidney protection due to her diabetes. She is on Synthroid  88 mcg daily for  thyroid  management, following the removal of her thyroid .  She currently does not have insurance.   STOP-BANG for SLEEP APNEA Do you Snore loudly? Yes Do you often feel Tired during day? Yes Has anyone Observed you stop breathing? No History of high blood Pressure? Yes BMI >35? Yes Age >50? Yes Neck circumference >16 in? No, 53 Gender female? No  5-8 = high risk 3-4 = intermediate 0-2 = low risk       ROS All review of systems negative except what is listed in the HPI      Objective:    BP 114/71   Pulse 77   Ht 5' 6 (1.676 m)   Wt 219 lb (99.3 kg)   SpO2 100%   BMI 35.35 kg/m    Physical Exam Vitals reviewed.  Constitutional:      Appearance: Normal appearance.  Cardiovascular:     Rate and Rhythm: Normal rate and regular rhythm.  Pulmonary:     Effort: Pulmonary effort is normal.     Breath sounds: Normal breath sounds.  Skin:    General: Skin is warm and dry.     Comments: Mild maculopapular rash to upper chest, no vesicles or pustules   Neurological:     Mental Status: She is alert and oriented to person, place, and time.  Psychiatric:        Mood and Affect: Mood normal.        Behavior: Behavior normal.  Thought Content: Thought content normal.        Judgment: Judgment normal.       No results found for any visits on 12/04/23.      Assessment & Plan:   Problem List Items Addressed This Visit       Active Problems   Hypothyroidism   Relevant Orders   TSH   Type 2 diabetes mellitus with hyperglycemia, without long-term current use of insulin (HCC)   Relevant Orders   Hemoglobin A1c   Other Visit Diagnoses       Fatigue, unspecified type    -  Primary   Relevant Orders   CBC with Differential/Platelet   Comprehensive metabolic panel with GFR   B12 and Folate Panel   TSH   Iron, TIBC and Ferritin Panel     Pruritic dermatitis       Relevant Medications   triamcinolone  cream (KENALOG ) 0.1 %      Assessment and  Plan Assessment & Plan Fatigue and Excessive Daytime Sleepiness Persistent fatigue and excessive daytime sleepiness despite adequate sleep. Differential includes sleep apnea, anemia, hypothyroidism, and vitamin B12 deficiency. Borderline neck circumference for sleep apnea risk. Sleep study considered if blood work is inconclusive - currently self-pay and prefers to hold off for now. - Order blood work: iron levels, B12 levels, thyroid  function tests, A1c, complete blood count. - Consider sleep study if blood work inconclusive and symptoms persist, pending affordability - hoping to get insurance next year. - Advise on sleep hygiene: reduce screen time before bed, ensure healthy diet and exercise.  Hypothyroidism On Synthroid  88 mcg daily. Thyroid  function tests planned to assess dose adequacy. - Order thyroid  function tests.  Rash on Chest Itchy rash on chest, suspected allergic etiology. Discussed steroid cream use  - Prescribe triamcinolone  cream BID - no more than 10 consecutive day. - Advise Zyrtec or Claritin  once daily for two weeks. - Instruct to monitor for changes   Type 2 Diabetes Mellitus On Ozempic  and glimepiride . Previous A1c was high. - Order A1c test.         Meds ordered this encounter  Medications   triamcinolone  cream (KENALOG ) 0.1 %    Sig: Apply 1 Application topically 2 (two) times daily.    Dispense:  30 g    Refill:  0    Supervising Provider:   Randie Bustle A [[id number]]    Return if symptoms worsen or fail to improve.  Everlina Hock, NP

## 2023-12-05 ENCOUNTER — Ambulatory Visit: Payer: Self-pay | Admitting: Family Medicine

## 2023-12-05 DIAGNOSIS — E1165 Type 2 diabetes mellitus with hyperglycemia: Secondary | ICD-10-CM

## 2023-12-05 LAB — CBC WITH DIFFERENTIAL/PLATELET
Absolute Lymphocytes: 3330 {cells}/uL (ref 850–3900)
Absolute Monocytes: 593 {cells}/uL (ref 200–950)
Basophils Absolute: 38 {cells}/uL (ref 0–200)
Basophils Relative: 0.5 %
Eosinophils Absolute: 180 {cells}/uL (ref 15–500)
Eosinophils Relative: 2.4 %
HCT: 38.7 % (ref 35.0–45.0)
Hemoglobin: 12.4 g/dL (ref 11.7–15.5)
MCH: 28.3 pg (ref 27.0–33.0)
MCHC: 32 g/dL (ref 32.0–36.0)
MCV: 88.4 fL (ref 80.0–100.0)
MPV: 11.3 fL (ref 7.5–12.5)
Monocytes Relative: 7.9 %
Neutro Abs: 3360 {cells}/uL (ref 1500–7800)
Neutrophils Relative %: 44.8 %
Platelets: 292 10*3/uL (ref 140–400)
RBC: 4.38 10*6/uL (ref 3.80–5.10)
RDW: 12.9 % (ref 11.0–15.0)
Total Lymphocyte: 44.4 %
WBC: 7.5 10*3/uL (ref 3.8–10.8)

## 2023-12-05 LAB — COMPREHENSIVE METABOLIC PANEL WITH GFR
AG Ratio: 2 (calc) (ref 1.0–2.5)
ALT: 21 U/L (ref 6–29)
AST: 18 U/L (ref 10–35)
Albumin: 4.3 g/dL (ref 3.6–5.1)
Alkaline phosphatase (APISO): 45 U/L (ref 37–153)
BUN/Creatinine Ratio: 21 (calc) (ref 6–22)
BUN: 9 mg/dL (ref 7–25)
CO2: 25 mmol/L (ref 20–32)
Calcium: 9.5 mg/dL (ref 8.6–10.4)
Chloride: 105 mmol/L (ref 98–110)
Creat: 0.43 mg/dL — ABNORMAL LOW (ref 0.50–1.03)
Globulin: 2.2 g/dL (ref 1.9–3.7)
Glucose, Bld: 140 mg/dL — ABNORMAL HIGH (ref 65–99)
Potassium: 3.8 mmol/L (ref 3.5–5.3)
Sodium: 142 mmol/L (ref 135–146)
Total Bilirubin: 0.6 mg/dL (ref 0.2–1.2)
Total Protein: 6.5 g/dL (ref 6.1–8.1)
eGFR: 112 mL/min/{1.73_m2} (ref >=60)

## 2023-12-05 LAB — HEMOGLOBIN A1C
Hgb A1c MFr Bld: 8 % — ABNORMAL HIGH (ref <5.7)
Mean Plasma Glucose: 183 mg/dL
eAG (mmol/L): 10.1 mmol/L

## 2023-12-05 LAB — IRON,TIBC AND FERRITIN PANEL
%SAT: 29 % (ref 16–45)
Ferritin: 185 ng/mL (ref 16–232)
Iron: 81 ug/dL (ref 45–160)
TIBC: 281 ug/dL (ref 250–450)

## 2023-12-05 LAB — B12 AND FOLATE PANEL
Folate: >24 ng/mL
Vitamin B-12: 1023 pg/mL (ref 200–1100)

## 2023-12-05 LAB — TSH: TSH: 0.91 m[IU]/L (ref 0.40–4.50)

## 2023-12-05 MED ORDER — GLIMEPIRIDE 4 MG PO TABS
4.0000 mg | ORAL_TABLET | Freq: Every day | ORAL | 1 refills | Status: AC
Start: 2023-12-05 — End: ?

## 2023-12-25 ENCOUNTER — Telehealth: Payer: Self-pay

## 2023-12-25 NOTE — Telephone Encounter (Signed)
 Pt called. LVM and advised that Ozempic  has arrived and is ready for pick up. Placed in frig.

## 2024-01-19 ENCOUNTER — Other Ambulatory Visit: Payer: Self-pay | Admitting: Family Medicine

## 2024-01-19 DIAGNOSIS — E785 Hyperlipidemia, unspecified: Secondary | ICD-10-CM

## 2024-01-23 ENCOUNTER — Encounter: Payer: Self-pay | Admitting: Physician Assistant

## 2024-01-23 ENCOUNTER — Telehealth (INDEPENDENT_AMBULATORY_CARE_PROVIDER_SITE_OTHER): Payer: Self-pay | Admitting: Physician Assistant

## 2024-01-23 DIAGNOSIS — H1013 Acute atopic conjunctivitis, bilateral: Secondary | ICD-10-CM

## 2024-01-23 MED ORDER — AZELASTINE HCL 0.05 % OP SOLN: 1.0000 [drp] | Freq: Two times a day (BID) | OPHTHALMIC | 1 refills | Status: AC

## 2024-01-23 NOTE — Progress Notes (Signed)
 MyChart Video Visit    Virtual Visit via Video Note   This format is felt to be most appropriate for this patient at this time. Physical exam was limited by quality of the video and audio technology used for the visit.   Patient location: in car, not driving, alone Provider location: [email or web address]  I discussed the limitations of evaluation and management by telemedicine and the availability of in person appointments. The patient expressed understanding and agreed to proceed.  Patient: Felicia Solis   DOB: [birth year]   58 y.o. Female  MRN: [id number] Visit Date: 01/23/2024  Today's healthcare provider: Manuelita Flatness, PA-C   Cc. Itchy eyes  Subjective    HPI   Discussed the use of AI scribe software for clinical note transcription with the patient, who gave verbal consent to proceed.  History of Present Illness   Felicia Solis is a 58 year old female presents with itchy eyes.  She experiences itchy eyes and uses over-the-counter Pataday without relief.  There is no pain, crusting, or redness. She is concerned about exposure to conjunctivitis but has not observed redness or discharge.   She uses Systane for dry eyes and manages glaucoma with unspecified treatments.        Medications: Outpatient Medications Prior to Visit  Medication Sig   Ascorbic Acid (VITAMIN C) 100 MG tablet Take 100 mg by mouth daily.   blood glucose meter kit and supplies KIT Dispense based on patient and insurance preference. Use up to four times daily as directed. (FOR ICD-9 250.00, 250.01).   celecoxib  (CELEBREX ) 100 MG capsule Take 1 capsule (100 mg total) by mouth daily.   Cholecalciferol  (VITAMIN D3) 2000 units TABS Take 2,000 Units by mouth daily.    citalopram  (CELEXA ) 40 MG tablet Take 1 tablet by mouth daily.   cyanocobalamin  500 MCG tablet Take 1,000 mcg by mouth daily.    desvenlafaxine  (PRISTIQ ) 50 MG 24 hr tablet Take 1 tablet (50 mg total) by mouth daily.   dorzolamide-timolol (COSOPT)  22.3-6.8 MG/ML ophthalmic solution 1 drop 2 (two) times daily.   famotidine  (PEPCID ) 20 MG tablet Take 1 tablet (20 mg total) by mouth daily.   ferrous sulfate 325 (65 FE) MG tablet Take 325 mg by mouth daily with breakfast.   Flaxseed, Linseed, (FLAXSEED OIL PO) Take 1,300 mg by mouth daily.   fluticasone  (FLONASE ) 50 MCG/ACT nasal spray Place 2 sprays into both nostrils daily.   fluticasone  (FLONASE ) 50 MCG/ACT nasal spray Place 2 sprays into both nostrils daily.   glimepiride  (AMARYL ) 4 MG tablet Take 1 tablet (4 mg total) by mouth daily before breakfast.   glucose blood (ONETOUCH VERIO) test strip Onet[employer] Verio Check blood sugar twice daily   latanoprost (XALATAN) 0.005 % ophthalmic solution 1 drop daily.   levothyroxine  (SYNTHROID ) 88 MCG tablet TAKE 1 TABLET BY MOUTH ONCE DAILY BEFORE BREAKFAST   LORazepam  (ATIVAN ) 0.5 MG tablet Take 1 tablet (0.5 mg total) by mouth 2 (two) times daily as needed for anxiety.   losartan  (COZAAR ) 50 MG tablet Take 1 tablet (50 mg total) by mouth daily.   methocarbamol  (ROBAXIN ) 500 MG tablet Take 2 tablets (1,000 mg total) by mouth every 6 (six) hours as needed for muscle spasms.   Omega-3 Fatty Acids (FISH OIL) 1200 MG CAPS Take 1,200 mg by mouth daily.   ondansetron  (ZOFRAN ) 4 MG tablet Take 1 tablet (4 mg total) by mouth every 8 (eight) hours as needed for nausea or vomiting.  rosuvastatin  (CRESTOR ) 20 MG tablet Take 1 tablet (20 mg total) by mouth daily.   Semaglutide , 2 MG/DOSE, 8 MG/3ML SOPN Inject 2 mg as directed once a week.   solifenacin  (VESICARE ) 10 MG tablet Take 1 tablet (10 mg total) by mouth daily.   timolol (TIMOPTIC) 0.5 % ophthalmic solution 1 drop 2 (two) times daily.   triamcinolone  cream (KENALOG ) 0.1 % Apply 1 Application topically 2 (two) times daily.   No facility-administered medications prior to visit.    Review of Systems  Constitutional:  Negative for fatigue and fever.  Respiratory:  Negative for cough and shortness of  breath.   Cardiovascular:  Negative for chest pain and leg swelling.  Gastrointestinal:  Negative for abdominal pain.  Neurological:  Negative for dizziness and headaches.        Objective    There were no vitals taken for this visit.      Physical Exam Vitals reviewed.  Constitutional:      Appearance: She is not ill-appearing.  HENT:     Head: Normocephalic.  Cardiovascular:     Rate and Rhythm: Normal rate.  Pulmonary:     Effort: Pulmonary effort is normal. No respiratory distress.  Neurological:     Mental Status: She is alert and oriented to person, place, and time.  Psychiatric:        Mood and Affect: Mood normal.        Behavior: Behavior normal.        Assessment & Plan    1. Allergic conjunctivitis of both eyes (Primary) Rx azelastine  eye drops Reviewed glaucoma history ; appears to be open angle glaucoma, azelastine  is ok  Recommend starting otc claritin , zyrtec.  - azelastine  (OPTIVAR ) 0.05 % ophthalmic solution; Place 1 drop into both eyes 2 (two) times daily.  Dispense: 6 mL; Refill: 1  Return if symptoms worsen or fail to improve.     I discussed the assessment and treatment plan with the patient. The patient was provided an opportunity to ask questions and all were answered. The patient agreed with the plan and demonstrated an understanding of the instructions.   The patient was advised to call back or seek an in-person evaluation if the symptoms worsen or if the condition fails to improve as anticipated.  Manuelita Flatness, PA-C [facility] [phone] (phone) [phone] (fax)  [facility]

## 2024-03-09 ENCOUNTER — Other Ambulatory Visit: Payer: Self-pay | Admitting: Family Medicine

## 2024-03-09 DIAGNOSIS — Z0000 Encounter for general adult medical examination without abnormal findings: Secondary | ICD-10-CM

## 2024-03-26 ENCOUNTER — Ambulatory Visit: Payer: Self-pay

## 2024-04-10 ENCOUNTER — Ambulatory Visit: Payer: Self-pay

## 2024-04-11 ENCOUNTER — Emergency Department (HOSPITAL_BASED_OUTPATIENT_CLINIC_OR_DEPARTMENT_OTHER)
Admission: EM | Admit: 2024-04-11 | Discharge: 2024-04-12 | Disposition: A | Discharge status: Home / Self Care | Payer: Self-pay

## 2024-04-11 ENCOUNTER — Other Ambulatory Visit: Payer: Self-pay

## 2024-04-11 ENCOUNTER — Encounter (HOSPITAL_BASED_OUTPATIENT_CLINIC_OR_DEPARTMENT_OTHER): Payer: Self-pay

## 2024-04-11 DIAGNOSIS — H9201 Otalgia, right ear: Secondary | ICD-10-CM

## 2024-04-11 DIAGNOSIS — Z79899 Other long term (current) drug therapy: Secondary | ICD-10-CM | POA: Insufficient documentation

## 2024-04-11 DIAGNOSIS — J039 Acute tonsillitis, unspecified: Secondary | ICD-10-CM | POA: Insufficient documentation

## 2024-04-11 LAB — RESP PANEL BY RT-PCR (RSV, FLU A&B, COVID)  RVPGX2
Influenza A by PCR: NEGATIVE
Influenza B by PCR: NEGATIVE
Resp Syncytial Virus by PCR: NEGATIVE
SARS Coronavirus 2 by RT PCR: NEGATIVE

## 2024-04-11 LAB — GROUP A STREP BY PCR: Group A Strep by PCR: NOT DETECTED

## 2024-04-11 NOTE — ED Triage Notes (Signed)
 Pt states that she has had sore throat, nonproductive cough, congestion and right ear fullness.

## 2024-04-12 MED ORDER — DEXAMETHASONE 4 MG PO TABS: 6.0000 mg | ORAL_TABLET | Freq: Once | ORAL | Status: DC

## 2024-04-12 MED ORDER — LIDOCAINE VISCOUS HCL 2 % MT SOLN
15.0000 mL | Freq: Once | OROMUCOSAL | Status: AC
  Administered 2024-04-12: 15 mL via OROMUCOSAL
  Filled 2024-04-12: qty 15

## 2024-04-12 NOTE — ED Provider Notes (Signed)
 [place] EMERGENCY DEPARTMENT AT [facility] Provider Note   CSN: [id number] Arrival date & time: 04/11/24  2016     Patient presents with: Sore Throat   Felicia Solis is a 58 y.o. female.   58 year old female presents for evaluation of throat and ear pain.  She states has been well for the last couple days and she has not taken any medication for this.  Admits to some congestion and occasional coughing as well.  Denies any other symptoms or concerns   Sore Throat Pertinent negatives include no chest pain, no abdominal pain and no shortness of breath.       Prior to Admission medications   Medication Sig Start Date End Date Taking? Authorizing Provider  Ascorbic Acid (VITAMIN C) 100 MG tablet Take 100 mg by mouth daily.    [provider]  azelastine  (OPTIVAR ) 0.05 % ophthalmic solution Place 1 drop into both eyes 2 (two) times daily. 01/23/24   Cyndi Shaver, PA-C  blood glucose meter kit and supplies KIT Dispense based on patient and insurance preference. Use up to four times daily as directed. (FOR ICD-9 250.00, 250.01). 09/17/17   Antonio Meth, Jamee SAUNDERS, DO  celecoxib  (CELEBREX ) 100 MG capsule Take 1 capsule (100 mg total) by mouth daily. 07/11/22   Antonio Meth Jamee R, DO  Cholecalciferol  (VITAMIN D3) 2000 units TABS Take 2,000 Units by mouth daily.     [provider]  citalopram  (CELEXA ) 40 MG tablet Take 1 tablet by mouth daily.    [provider]  cyanocobalamin  500 MCG tablet Take 1,000 mcg by mouth daily.     [provider]  desvenlafaxine  (PRISTIQ ) 50 MG 24 hr tablet Take 1 tablet (50 mg total) by mouth daily. 02/28/23   Lowne Chase, Yvonne R, DO  dorzolamide-timolol (COSOPT) 22.3-6.8 MG/ML ophthalmic solution 1 drop 2 (two) times daily. 01/19/22   [provider]  famotidine  (PEPCID ) 20 MG tablet Take 1 tablet (20 mg total) by mouth daily. 10/16/23   Saguier, [place], PA-C  ferrous sulfate 325 (65 FE) MG tablet Take  325 mg by mouth daily with breakfast.    [provider]  Flaxseed, Linseed, (FLAXSEED OIL PO) Take 1,300 mg by mouth daily.    [provider]  fluticasone  (FLONASE ) 50 MCG/ACT nasal spray Place 2 sprays into both nostrils daily. 03/26/22   Antonio Meth Jamee SAUNDERS, DO  fluticasone  (FLONASE ) 50 MCG/ACT nasal spray Place 2 sprays into both nostrils daily. 10/16/23   Saguier, [place], PA-C  glimepiride  (AMARYL ) 4 MG tablet Take 1 tablet (4 mg total) by mouth daily before breakfast. 12/05/23   Almarie Waddell KATHEE, NP  glucose blood (ONETOUCH VERIO) test strip [facility] Check blood sugar twice daily 05/10/20   Antonio Meth, Yvonne R, DO  latanoprost (XALATAN) 0.005 % ophthalmic solution 1 drop daily. 10/28/23   [provider]  levothyroxine  (SYNTHROID ) 88 MCG tablet TAKE 1 TABLET BY MOUTH ONCE DAILY BEFORE BREAKFAST 08/05/23   Antonio Meth, Yvonne R, DO  LORazepam  (ATIVAN ) 0.5 MG tablet Take 1 tablet (0.5 mg total) by mouth 2 (two) times daily as needed for anxiety. 02/28/23   Antonio Meth Jamee SAUNDERS, DO  losartan  (COZAAR ) 50 MG tablet Take 1 tablet (50 mg total) by mouth daily. 01/20/24   Antonio Meth Jamee SAUNDERS, DO  methocarbamol  (ROBAXIN ) 500 MG tablet Take 2 tablets (1,000 mg total) by mouth every 6 (six) hours as needed for muscle spasms. 12/29/22   Dreama Longs, MD  Omega-3 Fatty Acids (FISH OIL) 1200 MG CAPS Take 1,200 mg by mouth daily.    [provider]  ondansetron  (ZOFRAN ) 4 MG tablet Take 1 tablet (4 mg total) by mouth every 8 (eight) hours as needed for nausea or vomiting. 10/16/23   Saguier, [place], PA-C  rosuvastatin  (CRESTOR ) 20 MG tablet Take 1 tablet (20 mg total) by mouth daily. 01/20/24   Antonio Cyndee Rockers R, DO  Semaglutide , 2 MG/DOSE, 8 MG/3ML SOPN Inject 2 mg as directed once a week. 09/02/23   Antonio Cyndee Rockers JONELLE, DO  solifenacin  (VESICARE ) 10 MG tablet Take 1 tablet (10 mg total) by mouth daily. 07/16/23   Lowne Chase, Yvonne R, DO  timolol (TIMOPTIC) 0.5 %  ophthalmic solution 1 drop 2 (two) times daily. 11/30/21   [provider]  triamcinolone  cream (KENALOG ) 0.1 % Apply 1 Application topically 2 (two) times daily. 12/04/23   Almarie Waddell NOVAK, NP    Allergies: Lisinopril     Review of Systems  Constitutional:  Negative for chills and fever.  HENT:  Positive for ear pain and sore throat.   Eyes:  Negative for pain and visual disturbance.  Respiratory:  Negative for cough and shortness of breath.   Cardiovascular:  Negative for chest pain and palpitations.  Gastrointestinal:  Negative for abdominal pain and vomiting.  Genitourinary:  Negative for dysuria and hematuria.  Musculoskeletal:  Negative for arthralgias and back pain.  Skin:  Negative for color change and rash.  Neurological:  Negative for seizures and syncope.  All other systems reviewed and are negative.   Updated Vital Signs BP 124/70 (BP Location: Right Arm)   Pulse 78   Temp 98.3 F (36.8 C) (Oral)   Resp 17   SpO2 100%   Physical Exam Vitals and nursing note reviewed.  Constitutional:      General: She is not in acute distress.    Appearance: She is well-developed. She is not ill-appearing.  HENT:     Head: Normocephalic and atraumatic.     Right Ear: Tympanic membrane and ear canal normal.     Left Ear: Tympanic membrane and ear canal normal.     Nose: No congestion.     Mouth/Throat:     Mouth: Mucous membranes are moist. No oral lesions.     Pharynx: No pharyngeal swelling or oropharyngeal exudate.  Eyes:     Conjunctiva/sclera: Conjunctivae normal.  Cardiovascular:     Rate and Rhythm: Normal rate and regular rhythm.     Heart sounds: No murmur heard. Pulmonary:     Effort: Pulmonary effort is normal. No respiratory distress.     Breath sounds: Normal breath sounds.  Abdominal:     Palpations: Abdomen is soft.     Tenderness: There is no abdominal tenderness.  Musculoskeletal:        General: No swelling.     Cervical back: Neck supple.   Skin:    General: Skin is warm and dry.     Capillary Refill: Capillary refill takes less than 2 seconds.  Neurological:     Mental Status: She is alert.  Psychiatric:        Mood and Affect: Mood normal.     (all labs ordered are listed, but only abnormal results are displayed) Labs Reviewed  RESP PANEL BY RT-PCR (RSV, FLU A&B, COVID)  RVPGX2  GROUP A STREP BY PCR    EKG: None  Radiology: No results found.   Procedures   Medications Ordered in  the ED  lidocaine  (XYLOCAINE ) 2 % viscous mouth solution 15 mL (15 mLs Mouth/Throat Given 04/12/24 0039)                                    Medical Decision Making Patient here with negative COVID flu and RSV.  Physical exam unremarkable.  Advised Tylenol  Motrin  and any other over-the-counter medications as needed for her symptoms.  Advise close up with her primary care doctor and otherwise return to the ER for your worsening symptoms.  She was given a dose of steroids for tonsillitis.  She feels comfortable with the plan to be discharged home.  Problems Addressed: Right ear pain: acute illness or injury Tonsillitis: acute illness or injury  Amount and/or Complexity of Data Reviewed External Data Reviewed: notes.    Details: Prior ED records reviewed and patient seen 6 months ago in the ER for finger swelling with negative workup Labs: ordered. Decision-making details documented in ED Course.  Risk OTC drugs. Prescription drug management.     Final diagnoses:  Tonsillitis  Right ear pain    ED Discharge Orders     None          Gennaro Duwaine CROME, DO 04/12/24 0602

## 2024-04-12 NOTE — Discharge Instructions (Addendum)
 You can alternate Tylenol  and Motrin  as needed for pain.  You can use any other over-the-counter medications as needed for your symptoms.

## 2024-04-16 ENCOUNTER — Other Ambulatory Visit: Payer: Self-pay | Admitting: Family Medicine

## 2024-04-18 ENCOUNTER — Emergency Department (HOSPITAL_BASED_OUTPATIENT_CLINIC_OR_DEPARTMENT_OTHER): Payer: Self-pay

## 2024-04-18 ENCOUNTER — Encounter (HOSPITAL_BASED_OUTPATIENT_CLINIC_OR_DEPARTMENT_OTHER): Payer: Self-pay | Admitting: Emergency Medicine

## 2024-04-18 ENCOUNTER — Emergency Department (HOSPITAL_BASED_OUTPATIENT_CLINIC_OR_DEPARTMENT_OTHER)
Admission: EM | Admit: 2024-04-18 | Discharge: 2024-04-18 | Disposition: A | Discharge status: Home / Self Care | Payer: Self-pay

## 2024-04-18 DIAGNOSIS — R059 Cough, unspecified: Secondary | ICD-10-CM

## 2024-04-18 DIAGNOSIS — E119 Type 2 diabetes mellitus without complications: Secondary | ICD-10-CM | POA: Insufficient documentation

## 2024-04-18 DIAGNOSIS — J329 Chronic sinusitis, unspecified: Secondary | ICD-10-CM | POA: Insufficient documentation

## 2024-04-18 DIAGNOSIS — E039 Hypothyroidism, unspecified: Secondary | ICD-10-CM | POA: Insufficient documentation

## 2024-04-18 DIAGNOSIS — Z7984 Long term (current) use of oral hypoglycemic drugs: Secondary | ICD-10-CM | POA: Insufficient documentation

## 2024-04-18 DIAGNOSIS — J45909 Unspecified asthma, uncomplicated: Secondary | ICD-10-CM | POA: Insufficient documentation

## 2024-04-18 DIAGNOSIS — Z79899 Other long term (current) drug therapy: Secondary | ICD-10-CM | POA: Insufficient documentation

## 2024-04-18 DIAGNOSIS — I10 Essential (primary) hypertension: Secondary | ICD-10-CM | POA: Insufficient documentation

## 2024-04-18 LAB — RESP PANEL BY RT-PCR (RSV, FLU A&B, COVID)  RVPGX2
Influenza A by PCR: NEGATIVE
Influenza B by PCR: NEGATIVE
Resp Syncytial Virus by PCR: NEGATIVE
SARS Coronavirus 2 by RT PCR: NEGATIVE

## 2024-04-18 MED ORDER — HYDROCODONE BIT-HOMATROP MBR 5-1.5 MG/5ML PO SOLN: 5.0000 mL | Freq: Four times a day (QID) | ORAL | 0 refills | Status: AC | PRN

## 2024-04-18 NOTE — ED Notes (Signed)
 Pt alert and oriented X 4 at the time of discharge. RR even and unlabored. No acute distress noted. Pt verbalized understanding of discharge instructions as discussed. Pt ambulatory to lobby at time of discharge.

## 2024-04-18 NOTE — ED Provider Notes (Signed)
 [place] EMERGENCY DEPARTMENT AT [facility] Provider Note   CSN: [id number] Arrival date & time: 04/18/24  1406     Patient presents with: Cough   Felicia Solis is a 58 y.o. female.   58 year old female presents today for concern of sinus congestion, sinus pressure in her forehead, rhinorrhea, postnasal drip, and cough.  Symptoms ongoing for about a week.  Has been taken over-the-counter medications for the past 2 to 3 days without relief.  Her main concern is cough that occurs mostly at night.  Occasionally productive of yellow sputum.  No fever.  States because of the coughing spells she started to develop left-sided chest pain.  However this only occurs with exertion.  No prior history of DVT, PE.  No history of CAD.  The history is provided by the patient. No language interpreter was used.       Prior to Admission medications   Medication Sig Start Date End Date Taking? Authorizing Provider  Ascorbic Acid (VITAMIN C) 100 MG tablet Take 100 mg by mouth daily.    [provider]  azelastine  (OPTIVAR ) 0.05 % ophthalmic solution Place 1 drop into both eyes 2 (two) times daily. 01/23/24   Cyndi Shaver, PA-C  blood glucose meter kit and supplies KIT Dispense based on patient and insurance preference. Use up to four times daily as directed. (FOR ICD-9 250.00, 250.01). 09/17/17   Antonio Meth, Jamee SAUNDERS, DO  celecoxib  (CELEBREX ) 100 MG capsule Take 1 capsule (100 mg total) by mouth daily. 07/11/22   Antonio Meth Jamee R, DO  Cholecalciferol  (VITAMIN D3) 2000 units TABS Take 2,000 Units by mouth daily.     [provider]  citalopram  (CELEXA ) 40 MG tablet Take 1 tablet by mouth daily.    [provider]  cyanocobalamin  500 MCG tablet Take 1,000 mcg by mouth daily.     [provider]  desvenlafaxine  (PRISTIQ ) 50 MG 24 hr tablet Take 1 tablet (50 mg total) by mouth daily. 02/28/23   Lowne Chase, Yvonne R, DO  dorzolamide-timolol (COSOPT)  22.3-6.8 MG/ML ophthalmic solution 1 drop 2 (two) times daily. 01/19/22   [provider]  famotidine  (PEPCID ) 20 MG tablet Take 1 tablet (20 mg total) by mouth daily. 10/16/23   Saguier, [place], PA-C  ferrous sulfate 325 (65 FE) MG tablet Take 325 mg by mouth daily with breakfast.    [provider]  Flaxseed, Linseed, (FLAXSEED OIL PO) Take 1,300 mg by mouth daily.    [provider]  fluticasone  (FLONASE ) 50 MCG/ACT nasal spray Place 2 sprays into both nostrils daily. 03/26/22   Antonio Meth Jamee R, DO  fluticasone  (FLONASE ) 50 MCG/ACT nasal spray Place 2 sprays into both nostrils daily. 10/16/23   Saguier, [place], PA-C  glimepiride  (AMARYL ) 4 MG tablet Take 1 tablet (4 mg total) by mouth daily before breakfast. 12/05/23   Almarie Waddell KATHEE, NP  glucose blood (ONETOUCH VERIO) test strip [facility] Check blood sugar twice daily 05/10/20   Antonio Meth, Yvonne R, DO  latanoprost (XALATAN) 0.005 % ophthalmic solution 1 drop daily. 10/28/23   [provider]  levothyroxine  (SYNTHROID ) 88 MCG tablet Take 1 tablet (88 mcg total) by mouth daily before breakfast. Needs appt 04/16/24   Antonio Meth, Yvonne R, DO  LORazepam  (ATIVAN ) 0.5 MG tablet Take 1 tablet (0.5 mg total) by mouth 2 (two) times daily as needed for anxiety. 02/28/23   Antonio Meth Jamee SAUNDERS, DO  losartan  (COZAAR ) 50 MG tablet Take  1 tablet (50 mg total) by mouth daily. 01/20/24   Antonio Cyndee Jamee JONELLE, DO  methocarbamol  (ROBAXIN ) 500 MG tablet Take 2 tablets (1,000 mg total) by mouth every 6 (six) hours as needed for muscle spasms. 12/29/22   Dreama Longs, MD  Omega-3 Fatty Acids (FISH OIL) 1200 MG CAPS Take 1,200 mg by mouth daily.    [provider]  ondansetron  (ZOFRAN ) 4 MG tablet Take 1 tablet (4 mg total) by mouth every 8 (eight) hours as needed for nausea or vomiting. 10/16/23   Saguier, [place], PA-C  rosuvastatin  (CRESTOR ) 20 MG tablet Take 1 tablet (20 mg total) by mouth daily. 01/20/24   Antonio Cyndee Jamee R, DO  Semaglutide , 2 MG/DOSE, 8 MG/3ML SOPN Inject 2 mg as directed once a week. 09/02/23   Antonio Cyndee Jamee JONELLE, DO  solifenacin  (VESICARE ) 10 MG tablet Take 1 tablet (10 mg total) by mouth daily. 07/16/23   Lowne Chase, Yvonne R, DO  timolol (TIMOPTIC) 0.5 % ophthalmic solution 1 drop 2 (two) times daily. 11/30/21   [provider]  triamcinolone  cream (KENALOG ) 0.1 % Apply 1 Application topically 2 (two) times daily. 12/04/23   Almarie Waddell NOVAK, NP    Allergies: Lisinopril     Review of Systems  Constitutional:  Negative for chills and fever.  HENT:  Positive for congestion and postnasal drip. Negative for sore throat and trouble swallowing.   Respiratory:  Positive for cough. Negative for shortness of breath.   Cardiovascular:  Positive for chest pain. Negative for palpitations and leg swelling.  Neurological:  Negative for light-headedness.  All other systems reviewed and are negative.   Updated Vital Signs BP 134/79   Pulse 90   Temp 98 F (36.7 C)   Resp 20   Ht 5' 6 (1.676 m)   Wt 108 kg   SpO2 100%   BMI 38.41 kg/m   Physical Exam Vitals and nursing note reviewed.  Constitutional:      General: She is not in acute distress.    Appearance: Normal appearance. She is not ill-appearing.  HENT:     Head: Normocephalic and atraumatic.     Nose: Nose normal.  Eyes:     Conjunctiva/sclera: Conjunctivae normal.  Cardiovascular:     Rate and Rhythm: Normal rate and regular rhythm.  Pulmonary:     Effort: Pulmonary effort is normal. No respiratory distress.  Musculoskeletal:        General: No deformity. Normal range of motion.     Cervical back: Normal range of motion.  Skin:    Findings: No rash.  Neurological:     Mental Status: She is alert.     (all labs ordered are listed, but only abnormal results are displayed) Labs Reviewed  RESP PANEL BY RT-PCR (RSV, FLU A&B, COVID)  RVPGX2    EKG: None  Radiology: No results  found.   Procedures   Medications Ordered in the ED - No data to display                                  Medical Decision Making Amount and/or Complexity of Data Reviewed Radiology: ordered.  Risk Prescription drug management.   Medical Decision Making / ED Course   This patient presents to the ED for concern of cough, sinus congestion, postnasal drip, this involves an extensive number of treatment options, and is a complaint that carries with it a  high risk of complications and morbidity.  The differential diagnosis includes sinusitis, viral URI, ACS, pneumonia, PE  MDM: 58 year old otherwise healthy female presents with above-mentioned complaints. Symptoms most consistent with sinusitis.  However will obtain chest x-ray to rule out pneumonia.  Given her chest pain which is likely musculoskeletal in nature will obtain EKG.  Low heart score.  Sounds atypical for ACS. EKG without acute ischemic change Chest x-ray without acute cardiopulmonary process. Chest pain is likely musculoskeletal.  History is consistent with this.  She is requesting cough syrup.  Will give Hycodan.  But discussed do not take this while she is driving or before work and to only use this for severe coughing spells. Sinus rinse discussed at length. Patient discharged in stable condition.    Lab Tests: -I ordered, reviewed, and interpreted labs.   The pertinent results include:   Labs Reviewed  RESP PANEL BY RT-PCR (RSV, FLU A&B, COVID)  RVPGX2      EKG  EKG Interpretation Date/Time:    Ventricular Rate:    PR Interval:    QRS Duration:    QT Interval:    QTC Calculation:   R Axis:      Text Interpretation:           Imaging Studies ordered: I ordered imaging studies including chest x-ray I independently visualized and interpreted imaging. I agree with the radiologist interpretation   Medicines ordered and prescription drug management: No orders of the defined types were placed  in this encounter.   -I have reviewed the patients home medicines and have made adjustments as needed   Reevaluation: After the interventions noted above, I reevaluated the patient and found that they have :stayed the same  Co morbidities that complicate the patient evaluation  Past Medical History:  Diagnosis Date   Anemia    anemia  when pregnant in 2002   Anxiety    Arthritis    knees and back (09/28/2016)   Asthma 5 yrs ago    told she had asthma.. does not remember doctors name.    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  Return precaution discussed.  Patient voices understanding and is in agreement with plan.   Final diagnoses:  Cough, unspecified type  Sinusitis, unspecified chronicity, unspecified location    ED Discharge Orders          Ordered    HYDROcodone  bit-homatropine (HYCODAN) 5-1.5 MG/5ML syrup  Every 6 hours PRN        04/18/24 1739               Hildegard Loge, PA-C 04/18/24 1745    Neysa Caron PARAS, DO 04/18/24 2335

## 2024-04-18 NOTE — Discharge Instructions (Addendum)
 I have attached information in regards to how to perform a sinus rinse.  Cough syrup sent into the pharmacy at your request.  Do not take this prior to work, or when you are driving as this will make you drowsy.  Take this at bedtime.  Take this only for severe coughing spells. Return for any emergent symptoms. Chest x-ray did not show any evidence of pneumonia. Respiratory panel was negative.

## 2024-04-18 NOTE — ED Triage Notes (Signed)
 Pt c/o cough, HA, runny nose x 1 wk; OTC meds not helping

## 2024-05-01 ENCOUNTER — Ambulatory Visit: Payer: Self-pay

## 2024-05-01 ENCOUNTER — Telehealth: Payer: Self-pay

## 2024-05-01 NOTE — Telephone Encounter (Signed)
 Copied from CRM [id number]. Topic: Clinical - Prescription Issue >> Apr 30, 2024  4:10 PM Sasha M wrote: Reason for CRM: Pt states that she get her prescription for Ozempic  through the financial assistance program at the office and they sent a fax to Dr Antonio about two weeks ago for a refill and have not received a response yet. Pt would like a status update for this as she is due to have her last shot on Sunday. I reviewed previous crms for update and found that she is needing an appt so I offered to schedule for her, appt is 11/14 at 3:40pm. Please call pt to advise of next steps for picking up her Ozempic  at [phone]

## 2024-05-01 NOTE — Telephone Encounter (Signed)
 Spoke w/ Pt- asked if PCP has been doing Ozempic  (Pt has not established care w/ Harlene Bill at endocrinology) for 2025 (I see paperwork for 2024 in media but not 2025), Pt states PCP has still been doing the Ozempic  refills for her. Paperwork printed from [employer] and faxed to [phone]. Form sent for scanning.

## 2024-05-08 ENCOUNTER — Ambulatory Visit: Payer: Self-pay | Admitting: Family Medicine

## 2024-05-15 ENCOUNTER — Encounter: Payer: Self-pay | Admitting: Family Medicine

## 2024-05-15 ENCOUNTER — Ambulatory Visit (INDEPENDENT_AMBULATORY_CARE_PROVIDER_SITE_OTHER): Payer: Self-pay | Admitting: Family Medicine

## 2024-05-15 VITALS — BP 124/74 | HR 78 | Temp 99.2°F | Resp 16 | Ht 66.0 in | Wt 219.4 lb

## 2024-05-15 DIAGNOSIS — I10 Essential (primary) hypertension: Secondary | ICD-10-CM

## 2024-05-15 DIAGNOSIS — E1169 Type 2 diabetes mellitus with other specified complication: Secondary | ICD-10-CM

## 2024-05-15 DIAGNOSIS — E890 Postprocedural hypothyroidism: Secondary | ICD-10-CM

## 2024-05-15 DIAGNOSIS — Z0000 Encounter for general adult medical examination without abnormal findings: Secondary | ICD-10-CM

## 2024-05-15 DIAGNOSIS — F418 Other specified anxiety disorders: Secondary | ICD-10-CM

## 2024-05-15 DIAGNOSIS — E785 Hyperlipidemia, unspecified: Secondary | ICD-10-CM

## 2024-05-15 DIAGNOSIS — Z7985 Long-term (current) use of injectable non-insulin antidiabetic drugs: Secondary | ICD-10-CM

## 2024-05-15 DIAGNOSIS — H1013 Acute atopic conjunctivitis, bilateral: Secondary | ICD-10-CM

## 2024-05-15 DIAGNOSIS — E1165 Type 2 diabetes mellitus with hyperglycemia: Secondary | ICD-10-CM

## 2024-05-15 MED ORDER — LOSARTAN POTASSIUM 50 MG PO TABS: 50.0000 mg | ORAL_TABLET | Freq: Every day | ORAL | 1 refills | Status: AC

## 2024-05-15 MED ORDER — SEMAGLUTIDE (2 MG/DOSE) 8 MG/3ML ~~LOC~~ SOPN: 2.0000 mg | PEN_INJECTOR | SUBCUTANEOUS | Status: AC

## 2024-05-15 MED ORDER — CITALOPRAM HYDROBROMIDE 40 MG PO TABS: 40.0000 mg | ORAL_TABLET | Freq: Every day | ORAL | 1 refills | Status: AC

## 2024-05-15 MED ORDER — ROSUVASTATIN CALCIUM 20 MG PO TABS: 20.0000 mg | ORAL_TABLET | Freq: Every day | ORAL | 1 refills | Status: AC

## 2024-05-15 NOTE — Assessment & Plan Note (Signed)
 Per endo

## 2024-05-15 NOTE — Progress Notes (Signed)
 Medication Samples have been provided to the patient.  Drug name: Ozempic         Strength: 2mg         Qty: 1   LOT: [id number]  Exp.Date: 09/23/2026  Dosing instructions: Inject 2mg  weekly  The patient has been instructed regarding the correct time, dose, and frequency of taking this medication, including desired effects and most common side effects.   Felicia Solis 2:59 PM 05/15/2024

## 2024-05-15 NOTE — Progress Notes (Signed)
 Subjective:    Patient ID: Felicia Solis, female    DOB: 02-Jun-1966, 58 y.o.   MRN: [id number]  Chief Complaint  Patient presents with   Annual Exam    Pt states not fasting.    HPI Patient is in today for cpe.  Discussed the use of AI scribe software for clinical note transcription with the patient, who gave verbal consent to proceed.  History of Present Illness She is a 58 year old female who presents for medication management.  She is currently taking citalopram  and has been taking 'three or four of those at one time.' She is concerned about potential weight gain associated with certain medications and wishes to avoid this side effect.  She is also on lisinopril .    Past Medical History:  Diagnosis Date   Anemia    anemia  when pregnant in 2002   Anxiety    Arthritis    knees and back (09/28/2016)   Asthma 5 yrs ago    told she had asthma.. does not remember doctors name.    Chronic lower back pain    Depression    takes citalopram    History of hiatal hernia    History of kidney stones    Hyperlipidemia    Hypothyroidism    LBBB (left bundle branch block)    rate-related; identified on stress test 03/21/16   Migraine    weekly (09/28/2016)   Positive TB test 2008   pt states she took 9 months of medicine for positive tb skin test   Shortness of breath    with exertion   Thyroid  disease    Type II diabetes mellitus (HCC) dx'd 2016    Past Surgical History:  Procedure Laterality Date   DILATION AND CURETTAGE OF UTERUS  1999   ENDOMETRIAL ABLATION     EXTRACORPOREAL SHOCK WAVE LITHOTRIPSY Right 04/02/2019   Procedure: EXTRACORPOREAL SHOCK WAVE LITHOTRIPSY (ESWL);  Surgeon: Devere Lonni Righter, MD;  Location: [facility];  Service: Urology;  Laterality: Right;   LAPAROSCOPY     years ago ..pt does not know where surgery was done ... states my stomach would blow up then go down   THYROIDECTOMY  09/28/2011   Procedure: THYROIDECTOMY;  Surgeon: Ida Loader,  MD;  Location: [facility] OR;  Service: ENT;  Laterality: Right;  RIGHT THYROIDECTOMY WITH FROZEN SECTION   THYROIDECTOMY  09/28/2016   completion of thyroidectomy/notes 09/28/2016   THYROIDECTOMY Left 09/28/2016   Procedure: COMPLETION OF THYROIDECTOMY;  Surgeon: Ida Loader, MD;  Location: [facility] OR;  Service: ENT;  Laterality: Left;   TUBAL LIGATION      Family History  Problem Relation Age of Onset   Diabetes Mother    Hypertension Mother    Hyperlipidemia Mother    Gallbladder disease Mother    Heart disease Mother    Diabetes Sister    Hypertension Father    Hyperlipidemia Father    Colon polyps Father    Ovarian cancer Maternal Grandmother    Gallbladder disease Maternal Grandmother    Breast cancer Maternal Grandmother 91   Colon cancer Paternal Grandfather    Breast cancer Maternal Aunt        late 60s   Heart disease Other        early cad   Diabetes Brother    Heart attack Brother    Anesthesia problems Neg Hx    Hypotension Neg Hx    Malignant hyperthermia Neg Hx    Pseudochol deficiency  Neg Hx    Esophageal cancer Neg Hx     Social History   Socioeconomic History   Marital status: Married    Spouse name: Not on file   Number of children: 3   Years of education: Not on file   Highest education level: Not on file  Occupational History   Occupation: CNA  Tobacco Use   Smoking status: Never   Smokeless tobacco: Never  Vaping Use   Vaping status: Never Used  Substance and Sexual Activity   Alcohol use: No    Alcohol/week: 0.0 standard drinks of alcohol   Drug use: No   Sexual activity: Not on file  Other Topics Concern   Not on file  Social History Narrative   No exercise--  Walking and lifting pts at work   Right handed   Lives in single story home   Social Drivers of Health   Financial Resource Strain: Not on file  Food Insecurity: Not on file  Transportation Needs: Not on file  Physical Activity: Not on file  Stress: Not on file  Social Connections:  Not on file  Intimate Partner Violence: Not on file    Outpatient Medications Prior to Visit  Medication Sig Dispense Refill   Ascorbic Acid (VITAMIN C) 100 MG tablet Take 100 mg by mouth daily.     azelastine  (OPTIVAR ) 0.05 % ophthalmic solution Place 1 drop into both eyes 2 (two) times daily. 6 mL 1   blood glucose meter kit and supplies KIT Dispense based on patient and insurance preference. Use up to four times daily as directed. (FOR ICD-9 250.00, 250.01). 1 each 0   celecoxib  (CELEBREX ) 100 MG capsule Take 1 capsule (100 mg total) by mouth daily. 30 capsule 2   Cholecalciferol  (VITAMIN D3) 2000 units TABS Take 2,000 Units by mouth daily.      cyanocobalamin  500 MCG tablet Take 1,000 mcg by mouth daily.      dorzolamide-timolol (COSOPT) 22.3-6.8 MG/ML ophthalmic solution 1 drop 2 (two) times daily.     empagliflozin  (JARDIANCE ) 25 MG TABS tablet Take 1 tablet (25 mg total) by mouth daily.     famotidine  (PEPCID ) 20 MG tablet Take 1 tablet (20 mg total) by mouth daily. 30 tablet 0   ferrous sulfate 325 (65 FE) MG tablet Take 325 mg by mouth daily with breakfast.     Flaxseed, Linseed, (FLAXSEED OIL PO) Take 1,300 mg by mouth daily.     fluticasone  (FLONASE ) 50 MCG/ACT nasal spray Place 2 sprays into both nostrils daily. 16 g 6   fluticasone  (FLONASE ) 50 MCG/ACT nasal spray Place 2 sprays into both nostrils daily. 16 g 1   glimepiride  (AMARYL ) 4 MG tablet Take 1 tablet (4 mg total) by mouth daily before breakfast. 90 tablet 1   glucose blood (ONETOUCH VERIO) test strip Onetouch Verio Check blood sugar twice daily 100 each 12   HYDROcodone  bit-homatropine (HYCODAN) 5-1.5 MG/5ML syrup Take 5 mLs by mouth every 6 (six) hours as needed for cough. 120 mL 0   latanoprost (XALATAN) 0.005 % ophthalmic solution 1 drop daily.     levothyroxine  (SYNTHROID ) 88 MCG tablet Take 1 tablet (88 mcg total) by mouth daily before breakfast. Needs appt 90 tablet 0   LORazepam  (ATIVAN ) 0.5 MG tablet Take 1  tablet (0.5 mg total) by mouth 2 (two) times daily as needed for anxiety. 30 tablet 1   methocarbamol  (ROBAXIN ) 500 MG tablet Take 2 tablets (1,000 mg total) by mouth every 6 (  six) hours as needed for muscle spasms. 20 tablet 0   Omega-3 Fatty Acids (FISH OIL) 1200 MG CAPS Take 1,200 mg by mouth daily.     ondansetron  (ZOFRAN ) 4 MG tablet Take 1 tablet (4 mg total) by mouth every 8 (eight) hours as needed for nausea or vomiting. 20 tablet 0   solifenacin  (VESICARE ) 10 MG tablet Take 1 tablet (10 mg total) by mouth daily. 90 tablet 1   timolol (TIMOPTIC) 0.5 % ophthalmic solution 1 drop 2 (two) times daily.     triamcinolone  cream (KENALOG ) 0.1 % Apply 1 Application topically 2 (two) times daily. 30 g 0   citalopram  (CELEXA ) 40 MG tablet Take 1 tablet by mouth daily.     desvenlafaxine  (PRISTIQ ) 50 MG 24 hr tablet Take 1 tablet (50 mg total) by mouth daily. 30 tablet 3   losartan  (COZAAR ) 50 MG tablet Take 1 tablet (50 mg total) by mouth daily. 90 tablet 0   rosuvastatin  (CRESTOR ) 20 MG tablet Take 1 tablet (20 mg total) by mouth daily. 90 tablet 0   Semaglutide , 2 MG/DOSE, 8 MG/3ML SOPN Inject 2 mg as directed once a week. 3 mL 0   No facility-administered medications prior to visit.    Allergies  Allergen Reactions   Lisinopril  Cough    Review of Systems  Constitutional:  Negative for fever and malaise/fatigue.  HENT:  Negative for congestion.   Eyes:  Negative for blurred vision.  Respiratory:  Negative for cough and shortness of breath.   Cardiovascular:  Negative for chest pain, palpitations and leg swelling.  Gastrointestinal:  Negative for abdominal pain, blood in stool, nausea and vomiting.  Genitourinary:  Negative for dysuria and frequency.  Musculoskeletal:  Negative for back pain and falls.  Skin:  Negative for rash.  Neurological:  Negative for dizziness, loss of consciousness and headaches.  Endo/Heme/Allergies:  Negative for environmental allergies.   Psychiatric/Behavioral:  Negative for depression. The patient is not nervous/anxious.        Objective:    Physical Exam Vitals and nursing note reviewed.  Constitutional:      General: She is not in acute distress.    Appearance: Normal appearance. She is well-developed.  HENT:     Head: Normocephalic and atraumatic.  Eyes:     General: No scleral icterus.       Right eye: No discharge.        Left eye: No discharge.  Cardiovascular:     Rate and Rhythm: Normal rate and regular rhythm.     Heart sounds: No murmur heard. Pulmonary:     Effort: Pulmonary effort is normal. No respiratory distress.     Breath sounds: Normal breath sounds.  Musculoskeletal:        General: Normal range of motion.     Cervical back: Normal range of motion and neck supple.     Right lower leg: No edema.     Left lower leg: No edema.  Skin:    General: Skin is warm and dry.  Neurological:     Mental Status: She is alert and oriented to person, place, and time.  Psychiatric:        Mood and Affect: Mood normal.        Behavior: Behavior normal.        Thought Content: Thought content normal.        Judgment: Judgment normal.     BP 124/74 (BP Location: Right Arm, Patient Position: Sitting, Cuff Size: Large)  Pulse 78   Temp 99.2 F (37.3 C) (Oral)   Resp 16   Ht 5' 6 (1.676 m)   Wt 219 lb 6.4 oz (99.5 kg)   SpO2 98%   BMI 35.41 kg/m  Wt Readings from Last 3 Encounters:  05/15/24 219 lb 6.4 oz (99.5 kg)  04/18/24 238 lb (108 kg)  12/04/23 219 lb (99.3 kg)    Diabetic Foot Exam - Simple   No data filed    Lab Results  Component Value Date   WBC 7.5 12/04/2023   HGB 12.4 12/04/2023   HCT 38.7 12/04/2023   PLT 292 12/04/2023   GLUCOSE 140 (H) 12/04/2023   CHOL 123 07/16/2023   TRIG 117.0 07/16/2023   HDL 41.60 07/16/2023   LDLDIRECT 66.0 03/26/2022   LDLCALC 58 07/16/2023   ALT 21 12/04/2023   AST 18 12/04/2023   NA 142 12/04/2023   K 3.8 12/04/2023   CL 105  12/04/2023   CREATININE 0.43 (L) 12/04/2023   BUN 9 12/04/2023   CO2 25 12/04/2023   TSH 0.91 12/04/2023   HGBA1C 8.0 (H) 12/04/2023   MICROALBUR 13.7 08/19/2020    Lab Results  Component Value Date   TSH 0.91 12/04/2023   Lab Results  Component Value Date   WBC 7.5 12/04/2023   HGB 12.4 12/04/2023   HCT 38.7 12/04/2023   MCV 88.4 12/04/2023   PLT 292 12/04/2023   Lab Results  Component Value Date   NA 142 12/04/2023   K 3.8 12/04/2023   CO2 25 12/04/2023   GLUCOSE 140 (H) 12/04/2023   BUN 9 12/04/2023   CREATININE 0.43 (L) 12/04/2023   BILITOT 0.6 12/04/2023   ALKPHOS 50 07/16/2023   AST 18 12/04/2023   ALT 21 12/04/2023   PROT 6.5 12/04/2023   ALBUMIN 4.6 07/16/2023   CALCIUM  9.5 12/04/2023   ANIONGAP 10 08/02/2021   EGFR 112 12/04/2023   GFR 104.69 07/16/2023   Lab Results  Component Value Date   CHOL 123 07/16/2023   Lab Results  Component Value Date   HDL 41.60 07/16/2023   Lab Results  Component Value Date   LDLCALC 58 07/16/2023   Lab Results  Component Value Date   TRIG 117.0 07/16/2023   Lab Results  Component Value Date   CHOLHDL 3 07/16/2023   Lab Results  Component Value Date   HGBA1C 8.0 (H) 12/04/2023       Assessment & Plan:  Preventative health care Assessment & Plan: Ghm utd Check labs  See AVS  Health Maintenance  Topic Date Due   Pneumococcal Vaccine: 50+ Years (1 of 2 - PCV) Never done   Hepatitis B Vaccines 19-59 Average Risk (1 of 3 - 19+ 3-dose series) Never done   Diabetic kidney evaluation - Urine ACR  08/19/2021   OPHTHALMOLOGY EXAM  04/04/2023   Mammogram  03/17/2024   Influenza Vaccine  09/22/2024 (Originally 01/24/2024)   HEMOGLOBIN A1C  06/04/2024   Colonoscopy  12/02/2024   Diabetic kidney evaluation - eGFR measurement  12/03/2024   FOOT EXAM  05/15/2025   Cervical Cancer Screening (HPV/Pap Cotest)  04/19/2027   DTaP/Tdap/Td (2 - Td or Tdap) 09/28/2027   Hepatitis C Screening  Completed   HIV Screening   Completed   Zoster Vaccines- Shingrix   Completed   HPV VACCINES  Aged Out   Meningococcal B Vaccine  Aged Out   COVID-19 Vaccine  Discontinued      Hyperlipidemia LDL goal <100 -  Rosuvastatin  Calcium ; Take 1 tablet (20 mg total) by mouth daily.  Dispense: 90 tablet; Refill: 1 -     Comprehensive metabolic panel with GFR -     Lipid panel -     Lipoprotein A (LPA)  Type 2 diabetes mellitus with hyperglycemia, without long-term current use of insulin (HCC) Assessment & Plan: Per endo   Orders: -     Lipoprotein A (LPA)  Postoperative hypothyroidism Assessment & Plan: Per endo  Orders: -     TSH  Uncontrolled type 2 diabetes mellitus with hyperglycemia (HCC)  Primary hypertension Assessment & Plan: Well controlled, no changes to meds. Encouraged heart healthy diet such as the DASH diet and exercise as tolerated.    Orders: -     Losartan  Potassium; Take 1 tablet (50 mg total) by mouth daily.  Dispense: 90 tablet; Refill: 1 -     CBC with Differential/Platelet -     Lipoprotein A (LPA)  Depression with anxiety -     Citalopram  Hydrobromide; Take 1 tablet (40 mg total) by mouth daily.  Dispense: 90 tablet; Refill: 1  Allergic conjunctivitis of both eyes  Hyperlipidemia associated with type 2 diabetes mellitus (HCC) Assessment & Plan: Encourage heart healthy diet such as MIND or DASH diet, increase exercise, avoid trans fats, simple carbohydrates and processed foods, consider a krill or fish or flaxseed oil cap daily.     Other orders -     Semaglutide  (2 MG/DOSE); Inject 2 mg as directed once a week.  Dispense: 3 mL  .Assessment and Plan Assessment & Plan Essential hypertension   Hypertension is not managed with lisinopril .  Depression   Depression is managed with citalopram . She is currently taking half a tablet for a week, then a full tablet for at least six weeks. There is a discussion about potentially adding another medication to enhance efficacy,  considering weight gain as a side effect. Consider adding or changing medication if needed after six weeks.  General Health Maintenance   Routine health maintenance is up to date. A colonoscopy is due next June. A recent endocrinologist evaluation included urine and blood tests. Schedule colonoscopy for next June.      Foster Sonnier R Lowne Chase, DO

## 2024-05-15 NOTE — Assessment & Plan Note (Signed)
 Encourage heart healthy diet such as MIND or DASH diet, increase exercise, avoid trans fats, simple carbohydrates and processed foods, consider a krill or fish or flaxseed oil cap daily.

## 2024-05-15 NOTE — Assessment & Plan Note (Signed)
 Ghm utd Check labs  See AVS  Health Maintenance  Topic Date Due   Pneumococcal Vaccine: 50+ Years (1 of 2 - PCV) Never done   Hepatitis B Vaccines 19-59 Average Risk (1 of 3 - 19+ 3-dose series) Never done   Diabetic kidney evaluation - Urine ACR  08/19/2021   OPHTHALMOLOGY EXAM  04/04/2023   Mammogram  03/17/2024   Influenza Vaccine  09/22/2024 (Originally 01/24/2024)   HEMOGLOBIN A1C  06/04/2024   Colonoscopy  12/02/2024   Diabetic kidney evaluation - eGFR measurement  12/03/2024   FOOT EXAM  05/15/2025   Cervical Cancer Screening (HPV/Pap Cotest)  04/19/2027   DTaP/Tdap/Td (2 - Td or Tdap) 09/28/2027   Hepatitis C Screening  Completed   HIV Screening  Completed   Zoster Vaccines- Shingrix   Completed   HPV VACCINES  Aged Out   Meningococcal B Vaccine  Aged Out   COVID-19 Vaccine  Discontinued

## 2024-05-15 NOTE — Patient Instructions (Signed)
 Preventive Care 59-59 Years Old, Female  Preventive care refers to lifestyle choices and visits with your health care provider that can promote health and wellness. Preventive care visits are also called wellness exams.  What can I expect for my preventive care visit?  Counseling  Your health care provider may ask you questions about your:  Medical history, including:  Past medical problems.  Family medical history.  Pregnancy history.  Current health, including:  Menstrual cycle.  Method of birth control.  Emotional well-being.  Home life and relationship well-being.  Sexual activity and sexual health.  Lifestyle, including:  Alcohol, nicotine or tobacco, and drug use.  Access to firearms.  Diet, exercise, and sleep habits.  Work and work Astronomer.  Sunscreen use.  Safety issues such as seatbelt and bike helmet use.  Physical exam  Your health care provider will check your:  Height and weight. These may be used to calculate your BMI (body mass index). BMI is a measurement that tells if you are at a healthy weight.  Waist circumference. This measures the distance around your waistline. This measurement also tells if you are at a healthy weight and may help predict your risk of certain diseases, such as type 2 diabetes and high blood pressure.  Heart rate and blood pressure.  Body temperature.  Skin for abnormal spots.  What immunizations do I need?    Vaccines are usually given at various ages, according to a schedule. Your health care provider will recommend vaccines for you based on your age, medical history, and lifestyle or other factors, such as travel or where you work.  What tests do I need?  Screening  Your health care provider may recommend screening tests for certain conditions. This may include:  Lipid and cholesterol levels.  Diabetes screening. This is done by checking your blood sugar (glucose) after you have not eaten for a while (fasting).  Pelvic exam and Pap test.  Hepatitis B test.  Hepatitis C  test.  HIV (human immunodeficiency virus) test.  STI (sexually transmitted infection) testing, if you are at risk.  Lung cancer screening.  Colorectal cancer screening.  Mammogram. Talk with your health care provider about when you should start having regular mammograms. This may depend on whether you have a family history of breast cancer.  BRCA-related cancer screening. This may be done if you have a family history of breast, ovarian, tubal, or peritoneal cancers.  Bone density scan. This is done to screen for osteoporosis.  Talk with your health care provider about your test results, treatment options, and if necessary, the need for more tests.  Follow these instructions at home:  Eating and drinking    Eat a diet that includes fresh fruits and vegetables, whole grains, lean protein, and low-fat dairy products.  Take vitamin and mineral supplements as recommended by your health care provider.  Do not drink alcohol if:  Your health care provider tells you not to drink.  You are pregnant, may be pregnant, or are planning to become pregnant.  If you drink alcohol:  Limit how much you have to 0-1 drink a day.  Know how much alcohol is in your drink. In the [place] one drink equals one 12 oz bottle of beer (355 mL), one 5 oz glass of wine (148 mL), or one 1 oz glass of hard liquor (44 mL).  Lifestyle  Brush your teeth every morning and night with fluoride toothpaste. Floss one time each day.  Exercise for at least  30 minutes 5 or more days each week.  Do not use any products that contain nicotine or tobacco. These products include cigarettes, chewing tobacco, and vaping devices, such as e-cigarettes. If you need help quitting, ask your health care provider.  Do not use drugs.  If you are sexually active, practice safe sex. Use a condom or other form of protection to prevent STIs.  If you do not wish to become pregnant, use a form of birth control. If you plan to become pregnant, see your health care provider for a  prepregnancy visit.  Take aspirin only as told by your health care provider. Make sure that you understand how much to take and what form to take. Work with your health care provider to find out whether it is safe and beneficial for you to take aspirin daily.  Find healthy ways to manage stress, such as:  Meditation, yoga, or listening to music.  Journaling.  Talking to a trusted person.  Spending time with friends and family.  Minimize exposure to UV radiation to reduce your risk of skin cancer.  Safety  Always wear your seat belt while driving or riding in a vehicle.  Do not drive:  If you have been drinking alcohol. Do not ride with someone who has been drinking.  When you are tired or distracted.  While texting.  If you have been using any mind-altering substances or drugs.  Wear a helmet and other protective equipment during sports activities.  If you have firearms in your house, make sure you follow all gun safety procedures.  Seek help if you have been physically or sexually abused.  What's next?  Visit your health care provider once a year for an annual wellness visit.  Ask your health care provider how often you should have your eyes and teeth checked.  Stay up to date on all vaccines.  This information is not intended to replace advice given to you by your health care provider. Make sure you discuss any questions you have with your health care provider.  Document Revised: 12/07/2020 Document Reviewed: 12/07/2020  Elsevier Patient Education  2024 [employer].

## 2024-05-15 NOTE — Assessment & Plan Note (Signed)
 Well controlled, no changes to meds. Encouraged heart healthy diet such as the DASH diet and exercise as tolerated.

## 2024-05-18 ENCOUNTER — Telehealth: Payer: Self-pay | Admitting: *Deleted

## 2024-05-18 NOTE — Telephone Encounter (Signed)
 Left message that Ozempic  is here and ready for pickup.

## 2024-05-19 NOTE — Progress Notes (Unsigned)
 [facility] [address], [address] [place], [place] [address] [phone]  Date:  05/20/2024   Name:  Felicia Solis   DOB:  05/19/1966   MRN:  [id number]  PCP:  Antonio Cyndee Jamee JONELLE, DO    Chief Complaint: No chief complaint on file.   History of Present Illness:  Felicia Solis is a 58 y.o. very pleasant female patient who presents with the following:  Patient seen today with concern of cerumen impaction or other ear issue-she has dealt with impacted cerumen previously She is a primary patient of my partner Dr. Antonio Cyndee History of diabetes, hypertension   Discussed the use of AI scribe software for clinical note transcription with the patient, who gave verbal consent to proceed.  History of Present Illness     Patient Active Problem List   Diagnosis Date Noted   Urinary frequency 07/16/2023   Right lower quadrant abdominal pain 07/16/2023   Bilateral impacted cerumen 03/26/2022   Impacted cerumen of right ear 02/15/2022   Otitis externa 02/15/2022   Other spondylosis with radiculopathy, cervical region 08/01/2021   Neck pain 07/11/2021   Skin tag 12/18/2020   Gastroenteritis 12/06/2020   COVID-19 07/03/2020   TMJ (sprain of temporomandibular joint), initial encounter 07/03/2020   Type 2 diabetes mellitus with hyperglycemia, without long-term current use of insulin (HCC) 02/25/2020   Trapezius muscle strain, left, initial encounter 08/17/2019   Left wrist pain 08/11/2019   Acute pain of left shoulder 08/11/2019   Acute nonintractable headache 08/11/2019   De Quervain's tenosynovitis, left 08/11/2019   Vertigo 06/09/2019   Right acute serous otitis media 06/09/2019   Impacted cerumen of left ear 06/09/2019   Pelvic pain 02/23/2019   HNP (herniated nucleus pulposus), cervical 01/04/2019   Epigastric mass 10/21/2018   Myalgia 03/18/2018   Hyperlipidemia associated with type 2 diabetes mellitus (HCC)  03/18/2018   Nausea and vomiting 12/02/2017   Hematuria 12/02/2017   Pansinusitis 11/19/2017   Agoraphobia with panic attacks 11/19/2017   Preventative health care 09/27/2017   Hyperlipidemia LDL goal <100 09/18/2017   Vitamin D  deficiency 09/18/2017   Hypothyroidism 09/18/2017   Other intervertebral disc degeneration, lumbar region 04/07/2017   Left hip pain 04/07/2017   Hyperlipidemia LDL goal <70 04/07/2017   Primary hypertension 04/07/2017   Acute vaginitis 04/07/2017   S/P complete thyroidectomy 09/28/2016   Memory loss 08/23/2016   Thyroid  nodule 08/02/2016   Plantar fasciitis of right foot 12/01/2015   Knee pain, right 12/01/2015   Headache, migraine 09/06/2015   Bilateral knee pain 08/22/2015   Thoracic myofascial strain 08/22/2015   Dysphagia 04/10/2015   Chest pain 04/10/2015   Epigastric pain 04/10/2015   Functional diarrhea 04/10/2015   NSAID long-term use 04/10/2015   Midepigastric pain 03/22/2015   Cough 01/07/2015   Allergic rhinitis 12/13/2014   Acute bronchitis 12/13/2014   Blood in stool 10/01/2014   DM (diabetes mellitus) type II uncontrolled, periph vascular disorder 08/13/2014   Abnormal ECG 05/14/2012   Breast pain 10/23/2011   ASCUS on Pap smear 10/08/2011   H/O thyroid  nodule 08/27/2011   Depression with anxiety 08/27/2011   Heavy periods 08/27/2011    Past Medical History:  Diagnosis Date   Anemia    anemia  when pregnant in 2002   Anxiety    Arthritis    knees and back (09/28/2016)   Asthma 5 yrs ago    told she had asthma.Felicia Solis  does not remember doctors name.    Chronic lower back pain    Depression    takes citalopram    History of hiatal hernia    History of kidney stones    Hyperlipidemia    Hypothyroidism    LBBB (left bundle branch block)    rate-related; identified on stress test 03/21/16   Migraine    weekly (09/28/2016)   Positive TB test 2008   pt states she took 9 months of medicine for positive tb skin test   Shortness of  breath    with exertion   Thyroid  disease    Type II diabetes mellitus (HCC) dx'd 2016    Past Surgical History:  Procedure Laterality Date   DILATION AND CURETTAGE OF UTERUS  1999   ENDOMETRIAL ABLATION     EXTRACORPOREAL SHOCK WAVE LITHOTRIPSY Right 04/02/2019   Procedure: EXTRACORPOREAL SHOCK WAVE LITHOTRIPSY (ESWL);  Surgeon: Devere Lonni Righter, MD;  Location: [facility];  Service: Urology;  Laterality: Right;   LAPAROSCOPY     years ago ..pt does not know where surgery was done ... states my stomach would blow up then go down   THYROIDECTOMY  09/28/2011   Procedure: THYROIDECTOMY;  Surgeon: Ida Loader, MD;  Location: [facility] OR;  Service: ENT;  Laterality: Right;  RIGHT THYROIDECTOMY WITH FROZEN SECTION   THYROIDECTOMY  09/28/2016   completion of thyroidectomy/notes 09/28/2016   THYROIDECTOMY Left 09/28/2016   Procedure: COMPLETION OF THYROIDECTOMY;  Surgeon: Ida Loader, MD;  Location: [facility] OR;  Service: ENT;  Laterality: Left;   TUBAL LIGATION      Social History   Tobacco Use   Smoking status: Never   Smokeless tobacco: Never  Vaping Use   Vaping status: Never Used  Substance Use Topics   Alcohol use: No    Alcohol/week: 0.0 standard drinks of alcohol   Drug use: No    Family History  Problem Relation Age of Onset   Diabetes Mother    Hypertension Mother    Hyperlipidemia Mother    Gallbladder disease Mother    Heart disease Mother    Diabetes Sister    Hypertension Father    Hyperlipidemia Father    Colon polyps Father    Ovarian cancer Maternal Grandmother    Gallbladder disease Maternal Grandmother    Breast cancer Maternal Grandmother 5   Colon cancer Paternal Grandfather    Breast cancer Maternal Aunt        late 28s   Heart disease Other        early cad   Diabetes Brother    Heart attack Brother    Anesthesia problems Neg Hx    Hypotension Neg Hx    Malignant hyperthermia Neg Hx    Pseudochol deficiency Neg Hx    Esophageal cancer Neg Hx      Allergies  Allergen Reactions   Lisinopril  Cough    Medication list has been reviewed and updated.  Current Outpatient Medications on File Prior to Visit  Medication Sig Dispense Refill   Ascorbic Acid (VITAMIN C) 100 MG tablet Take 100 mg by mouth daily.     azelastine  (OPTIVAR ) 0.05 % ophthalmic solution Place 1 drop into both eyes 2 (two) times daily. 6 mL 1   blood glucose meter kit and supplies KIT Dispense based on patient and insurance preference. Use up to four times daily as directed. (FOR ICD-9 250.00, 250.01). 1 each 0   celecoxib  (CELEBREX ) 100 MG capsule Take 1 capsule (100 mg total)  by mouth daily. 30 capsule 2   Cholecalciferol  (VITAMIN D3) 2000 units TABS Take 2,000 Units by mouth daily.      citalopram  (CELEXA ) 40 MG tablet Take 1 tablet (40 mg total) by mouth daily. 90 tablet 1   cyanocobalamin  500 MCG tablet Take 1,000 mcg by mouth daily.      dorzolamide-timolol (COSOPT) 22.3-6.8 MG/ML ophthalmic solution 1 drop 2 (two) times daily.     empagliflozin  (JARDIANCE ) 25 MG TABS tablet Take 1 tablet (25 mg total) by mouth daily.     famotidine  (PEPCID ) 20 MG tablet Take 1 tablet (20 mg total) by mouth daily. 30 tablet 0   ferrous sulfate 325 (65 FE) MG tablet Take 325 mg by mouth daily with breakfast.     Flaxseed, Linseed, (FLAXSEED OIL PO) Take 1,300 mg by mouth daily.     fluticasone  (FLONASE ) 50 MCG/ACT nasal spray Place 2 sprays into both nostrils daily. 16 g 6   fluticasone  (FLONASE ) 50 MCG/ACT nasal spray Place 2 sprays into both nostrils daily. 16 g 1   glimepiride  (AMARYL ) 4 MG tablet Take 1 tablet (4 mg total) by mouth daily before breakfast. 90 tablet 1   glucose blood (ONETOUCH VERIO) test strip Onetouch Verio Check blood sugar twice daily 100 each 12   HYDROcodone  bit-homatropine (HYCODAN) 5-1.5 MG/5ML syrup Take 5 mLs by mouth every 6 (six) hours as needed for cough. 120 mL 0   latanoprost (XALATAN) 0.005 % ophthalmic solution 1 drop daily.      levothyroxine  (SYNTHROID ) 88 MCG tablet Take 1 tablet (88 mcg total) by mouth daily before breakfast. Needs appt 90 tablet 0   LORazepam  (ATIVAN ) 0.5 MG tablet Take 1 tablet (0.5 mg total) by mouth 2 (two) times daily as needed for anxiety. 30 tablet 1   losartan  (COZAAR ) 50 MG tablet Take 1 tablet (50 mg total) by mouth daily. 90 tablet 1   methocarbamol  (ROBAXIN ) 500 MG tablet Take 2 tablets (1,000 mg total) by mouth every 6 (six) hours as needed for muscle spasms. 20 tablet 0   Omega-3 Fatty Acids (FISH OIL) 1200 MG CAPS Take 1,200 mg by mouth daily.     ondansetron  (ZOFRAN ) 4 MG tablet Take 1 tablet (4 mg total) by mouth every 8 (eight) hours as needed for nausea or vomiting. 20 tablet 0   rosuvastatin  (CRESTOR ) 20 MG tablet Take 1 tablet (20 mg total) by mouth daily. 90 tablet 1   Semaglutide , 2 MG/DOSE, 8 MG/3ML SOPN Inject 2 mg as directed once a week. 3 mL    solifenacin  (VESICARE ) 10 MG tablet Take 1 tablet (10 mg total) by mouth daily. 90 tablet 1   timolol (TIMOPTIC) 0.5 % ophthalmic solution 1 drop 2 (two) times daily.     triamcinolone  cream (KENALOG ) 0.1 % Apply 1 Application topically 2 (two) times daily. 30 g 0   No current facility-administered medications on file prior to visit.    Review of Systems:  As per HPI- otherwise negative.    Physical Examination: There were no vitals filed for this visit. There were no vitals filed for this visit. There is no height or weight on file to calculate BMI. Ideal Body Weight:    GEN: no acute distress. HEENT: Atraumatic, Normocephalic.  Ears and Nose: No external deformity. CV: RRR, No M/G/R. No JVD. No thrill. No extra heart sounds. PULM: CTA B, no wheezes, crackles, rhonchi. No retractions. No resp. distress. No accessory muscle use. ABD: S, NT, ND, +BS. No rebound.  No HSM. EXTR: No c/c/e PSYCH: Normally interactive. Conversant.    Assessment and Plan: No diagnosis found.  Assessment & Plan   Signed Harlene Schroeder, MD

## 2024-05-20 ENCOUNTER — Ambulatory Visit: Payer: Self-pay | Admitting: Family Medicine

## 2024-05-20 ENCOUNTER — Encounter: Payer: Self-pay | Admitting: Family Medicine

## 2024-05-20 VITALS — BP 122/74 | HR 76 | Temp 98.5°F | Ht 66.0 in | Wt 219.0 lb

## 2024-05-20 DIAGNOSIS — H6123 Impacted cerumen, bilateral: Secondary | ICD-10-CM

## 2024-05-20 LAB — CBC WITH DIFFERENTIAL/PLATELET
Absolute Lymphocytes: 3952 {cells}/uL — ABNORMAL HIGH (ref 850–3900)
Absolute Monocytes: 616 {cells}/uL (ref 200–950)
Basophils Absolute: 56 {cells}/uL (ref 0–200)
Basophils Relative: 0.7 %
Eosinophils Absolute: 168 {cells}/uL (ref 15–500)
Eosinophils Relative: 2.1 %
HCT: 40.2 % (ref 35.0–45.0)
Hemoglobin: 13 g/dL (ref 11.7–15.5)
MCH: 28.1 pg (ref 27.0–33.0)
MCHC: 32.3 g/dL (ref 32.0–36.0)
MCV: 87 fL (ref 80.0–100.0)
MPV: 11 fL (ref 7.5–12.5)
Monocytes Relative: 7.7 %
Neutro Abs: 3208 {cells}/uL (ref 1500–7800)
Neutrophils Relative %: 40.1 %
Platelets: 304 Thousand/uL (ref 140–400)
RBC: 4.62 Million/uL (ref 3.80–5.10)
RDW: 13 % (ref 11.0–15.0)
Total Lymphocyte: 49.4 %
WBC: 8 Thousand/uL (ref 3.8–10.8)

## 2024-05-20 LAB — LIPID PANEL
Cholesterol: 90 mg/dL (ref <200)
HDL: 36 mg/dL — ABNORMAL LOW (ref >=50)
LDL Cholesterol (Calc): 35 mg/dL
Non-HDL Cholesterol (Calc): 54 mg/dL (ref <130)
Total CHOL/HDL Ratio: 2.5 (calc) (ref <5.0)
Triglycerides: 107 mg/dL (ref <150)

## 2024-05-20 LAB — COMPREHENSIVE METABOLIC PANEL WITH GFR
AG Ratio: 1.8 (calc) (ref 1.0–2.5)
ALT: 24 U/L (ref 6–29)
AST: 24 U/L (ref 10–35)
Albumin: 4.5 g/dL (ref 3.6–5.1)
Alkaline phosphatase (APISO): 48 U/L (ref 37–153)
BUN: 9 mg/dL (ref 7–25)
CO2: 27 mmol/L (ref 20–32)
Calcium: 9.4 mg/dL (ref 8.6–10.4)
Chloride: 106 mmol/L (ref 98–110)
Creat: 0.52 mg/dL (ref 0.50–1.03)
Globulin: 2.5 g/dL (ref 1.9–3.7)
Glucose, Bld: 102 mg/dL — ABNORMAL HIGH (ref 65–99)
Potassium: 4 mmol/L (ref 3.5–5.3)
Sodium: 142 mmol/L (ref 135–146)
Total Bilirubin: 0.5 mg/dL (ref 0.2–1.2)
Total Protein: 7 g/dL (ref 6.1–8.1)
eGFR: 107 mL/min/1.73m2 (ref >=60)

## 2024-05-20 LAB — TSH: TSH: 0.66 m[IU]/L (ref 0.40–4.50)

## 2024-05-20 LAB — LIPOPROTEIN A (LPA): Lipoprotein (a): 33 nmol/L (ref <75)

## 2024-05-25 ENCOUNTER — Ambulatory Visit: Payer: Self-pay

## 2024-05-26 ENCOUNTER — Inpatient Hospital Stay
Admission: RE | Admit: 2024-05-26 | Discharge: 2024-05-26 | Payer: Self-pay | Attending: Family Medicine | Admitting: Family Medicine

## 2024-05-26 DIAGNOSIS — Z0000 Encounter for general adult medical examination without abnormal findings: Secondary | ICD-10-CM

## 2024-07-23 ENCOUNTER — Other Ambulatory Visit: Payer: Self-pay | Admitting: Family Medicine
